# Patient Record
Sex: Female | Born: 1955 | Race: White | Hispanic: No | Marital: Married | Smoking: Never smoker
Health system: Southern US, Community
[De-identification: ages and names within clinical notes are randomized; demographics above are authoritative.]

## PROBLEM LIST (undated history)

## (undated) DIAGNOSIS — D62 Acute posthemorrhagic anemia: Secondary | ICD-10-CM

## (undated) DIAGNOSIS — C3411 Malignant neoplasm of upper lobe, right bronchus or lung: Secondary | ICD-10-CM

---

## 2002-10-11 ENCOUNTER — Ambulatory Visit (HOSPITAL_BASED_OUTPATIENT_CLINIC_OR_DEPARTMENT_OTHER): Admission: RE | Admit: 2002-10-11 | Discharge: 2002-10-11 | Payer: Self-pay | Admitting: Orthopedic Surgery

## 2002-10-11 ENCOUNTER — Encounter (INDEPENDENT_AMBULATORY_CARE_PROVIDER_SITE_OTHER): Payer: Self-pay | Admitting: *Deleted

## 2003-06-18 ENCOUNTER — Ambulatory Visit (HOSPITAL_COMMUNITY): Admission: RE | Admit: 2003-06-18 | Discharge: 2003-06-18 | Payer: Self-pay | Admitting: Family Medicine

## 2003-06-18 ENCOUNTER — Encounter: Payer: Self-pay | Admitting: Family Medicine

## 2003-07-16 ENCOUNTER — Ambulatory Visit (HOSPITAL_COMMUNITY): Admission: RE | Admit: 2003-07-16 | Discharge: 2003-07-16 | Payer: Self-pay | Admitting: Family Medicine

## 2004-01-12 ENCOUNTER — Other Ambulatory Visit: Admission: RE | Admit: 2004-01-12 | Discharge: 2004-01-12 | Payer: Self-pay | Admitting: Family Medicine

## 2005-02-10 ENCOUNTER — Other Ambulatory Visit: Admission: RE | Admit: 2005-02-10 | Discharge: 2005-02-10 | Payer: Self-pay | Admitting: Family Medicine

## 2006-06-21 ENCOUNTER — Other Ambulatory Visit: Admission: RE | Admit: 2006-06-21 | Discharge: 2006-06-21 | Payer: Self-pay | Admitting: Family Medicine

## 2007-07-25 ENCOUNTER — Other Ambulatory Visit: Admission: RE | Admit: 2007-07-25 | Discharge: 2007-07-25 | Payer: Self-pay | Admitting: Family Medicine

## 2008-07-28 ENCOUNTER — Other Ambulatory Visit: Admission: RE | Admit: 2008-07-28 | Discharge: 2008-07-28 | Payer: Self-pay | Admitting: Family Medicine

## 2009-07-21 ENCOUNTER — Encounter: Admission: RE | Admit: 2009-07-21 | Discharge: 2009-07-21 | Payer: Self-pay | Admitting: Family Medicine

## 2009-08-04 ENCOUNTER — Other Ambulatory Visit: Admission: RE | Admit: 2009-08-04 | Discharge: 2009-08-04 | Payer: Self-pay | Admitting: Family Medicine

## 2010-07-27 ENCOUNTER — Encounter: Admission: RE | Admit: 2010-07-27 | Discharge: 2010-07-27 | Payer: Self-pay | Admitting: Family Medicine

## 2010-08-09 ENCOUNTER — Ambulatory Visit (HOSPITAL_COMMUNITY): Admission: RE | Admit: 2010-08-09 | Discharge: 2010-08-09 | Payer: Self-pay | Admitting: Obstetrics and Gynecology

## 2010-09-30 ENCOUNTER — Other Ambulatory Visit
Admission: RE | Admit: 2010-09-30 | Discharge: 2010-09-30 | Payer: Self-pay | Source: Home / Self Care | Admitting: Family Medicine

## 2010-09-30 ENCOUNTER — Other Ambulatory Visit: Payer: Self-pay | Admitting: Family Medicine

## 2010-10-02 ENCOUNTER — Encounter: Payer: Self-pay | Admitting: Family Medicine

## 2010-11-23 LAB — CBC
HCT: 40.9 % (ref 36.0–46.0)
Hemoglobin: 13.8 g/dL (ref 12.0–15.0)
MCH: 31.2 pg (ref 26.0–34.0)
MCHC: 33.7 g/dL (ref 30.0–36.0)
MCV: 92.5 fL (ref 78.0–100.0)
Platelets: 240 10*3/uL (ref 150–400)
RBC: 4.42 MIL/uL (ref 3.87–5.11)
RDW: 14 % (ref 11.5–15.5)
WBC: 5.3 10*3/uL (ref 4.0–10.5)

## 2010-11-23 LAB — URINALYSIS, ROUTINE W REFLEX MICROSCOPIC
Bilirubin Urine: NEGATIVE
Glucose, UA: NEGATIVE mg/dL
Hgb urine dipstick: NEGATIVE
Ketones, ur: NEGATIVE mg/dL
Nitrite: NEGATIVE
Protein, ur: NEGATIVE mg/dL
Specific Gravity, Urine: 1.01 (ref 1.005–1.030)
Urobilinogen, UA: 0.2 mg/dL (ref 0.0–1.0)
pH: 8 (ref 5.0–8.0)

## 2010-11-23 LAB — BASIC METABOLIC PANEL
BUN: 10 mg/dL (ref 6–23)
Calcium: 9.1 mg/dL (ref 8.4–10.5)
GFR calc non Af Amer: >60 mL/min (ref >60)
Glucose, Bld: 90 mg/dL (ref 70–99)
Potassium: 3.9 mEq/L (ref 3.5–5.1)

## 2011-01-28 NOTE — Op Note (Signed)
   Rebecca Lucas, Rebecca Lucas                          ACCOUNT NO.:  [id number]   MEDICAL RECORD NO.:  [id number]                   PATIENT TYPE:  AMB   LOCATION:  DSC                                  FACILITY:  [facility]   PHYSICIAN:  Harvie Junior, M.D.                DATE OF BIRTH:  [birth year]   DATE OF PROCEDURE:  10/11/2002  DATE OF DISCHARGE:                                 OPERATIVE REPORT   PREOPERATIVE DIAGNOSIS:  Cystic mass, right index finger.   POSTOPERATIVE DIAGNOSIS:  Cystic mass, right index finger.   PROCEDURE:  Excision of  mass, right index finger.   SURGEON:  Harvie Junior, M.D.   ASSISTANT:  Marshia Ly, P.A.   ANESTHESIA:  Forearm based IV regional.   INDICATIONS:  She is a 55 year old female with a long history of having  a  cystic mass at the base of her index finger over the metacarpal. Ultimately  it was aspirated in the office and we were able to get out about 1.5 cc of  fluid. The cyst recurred. Because of recurrence of cyst and continuing  complaints of pain and aggravation, she was ultimately presented to the  operating room for excision.   DESCRIPTION OF PROCEDURE:  The patient was taken to the operating room.  After adequate anesthesia was obtained with a forearm based regional, she  was placed in the supine position on the operating table and the right arm  was prepped and draped in the usual sterile fashion.   Following this a linear incision was made over the mass and the subcutaneous  tissue to the level of the mass. The mass was clearly identified and divided  in all directions from the surrounding tissue down to the flexor tendon  sheath where it arose. It was excised en mass and sent to pathology. The  flexor tendon sheath was then identified and the weakness in the sheath was  grabbed with a rongeur and the sheath was gently opened to minimize  recurrence.   At this point the wound was copious irrigation and suctioned dry. The skin  was  closed with 4-0 nylon interrupted suture. A sterile dry compressive  dressing was applied.   The patient was then to the recovery room. She was noted to be in  satisfactory condition. Estimated blood loss for the procedure was none.                                                  Harvie Junior, M.D.   Ranae Plumber  D:  10/11/2002  T:  10/11/2002  Job:  [id number]

## 2011-07-12 ENCOUNTER — Other Ambulatory Visit: Payer: Self-pay | Admitting: Family Medicine

## 2011-07-12 DIAGNOSIS — Z1231 Encounter for screening mammogram for malignant neoplasm of breast: Secondary | ICD-10-CM

## 2011-08-19 ENCOUNTER — Ambulatory Visit
Admission: RE | Admit: 2011-08-19 | Discharge: 2011-08-19 | Disposition: A | Discharge status: Home / Self Care | Payer: Commercial Managed Care - PPO | Source: Ambulatory Visit | Attending: Family Medicine | Admitting: Family Medicine

## 2011-08-19 DIAGNOSIS — Z1231 Encounter for screening mammogram for malignant neoplasm of breast: Secondary | ICD-10-CM

## 2011-10-04 ENCOUNTER — Other Ambulatory Visit: Payer: Self-pay | Admitting: Family Medicine

## 2011-10-04 ENCOUNTER — Other Ambulatory Visit (HOSPITAL_COMMUNITY)
Admission: RE | Admit: 2011-10-04 | Discharge: 2011-10-04 | Disposition: A | Discharge status: Home / Self Care | Payer: 59 | Source: Ambulatory Visit | Attending: Family Medicine | Admitting: Family Medicine

## 2011-10-04 DIAGNOSIS — Z01419 Encounter for gynecological examination (general) (routine) without abnormal findings: Secondary | ICD-10-CM | POA: Insufficient documentation

## 2012-07-17 ENCOUNTER — Other Ambulatory Visit: Payer: Self-pay | Admitting: Family Medicine

## 2012-07-17 DIAGNOSIS — Z1231 Encounter for screening mammogram for malignant neoplasm of breast: Secondary | ICD-10-CM

## 2012-08-23 ENCOUNTER — Ambulatory Visit: Payer: Commercial Managed Care - PPO

## 2012-09-27 ENCOUNTER — Ambulatory Visit
Admission: RE | Admit: 2012-09-27 | Discharge: 2012-09-27 | Disposition: A | Discharge status: Home / Self Care | Payer: 59 | Source: Ambulatory Visit | Attending: Family Medicine | Admitting: Family Medicine

## 2012-09-27 DIAGNOSIS — Z1231 Encounter for screening mammogram for malignant neoplasm of breast: Secondary | ICD-10-CM

## 2013-10-04 ENCOUNTER — Other Ambulatory Visit: Payer: Self-pay

## 2013-10-04 DIAGNOSIS — Z1231 Encounter for screening mammogram for malignant neoplasm of breast: Secondary | ICD-10-CM

## 2013-10-17 ENCOUNTER — Ambulatory Visit
Admission: RE | Admit: 2013-10-17 | Discharge: 2013-10-17 | Disposition: A | Discharge status: Home / Self Care | Payer: 59 | Source: Ambulatory Visit

## 2013-10-17 DIAGNOSIS — Z1231 Encounter for screening mammogram for malignant neoplasm of breast: Secondary | ICD-10-CM

## 2015-05-05 ENCOUNTER — Other Ambulatory Visit (HOSPITAL_COMMUNITY)
Admission: RE | Admit: 2015-05-05 | Discharge: 2015-05-05 | Disposition: A | Discharge status: Home / Self Care | Payer: 59 | Source: Ambulatory Visit | Attending: Family Medicine | Admitting: Family Medicine

## 2015-05-05 ENCOUNTER — Other Ambulatory Visit: Payer: Self-pay | Admitting: Family Medicine

## 2015-05-05 DIAGNOSIS — Z01419 Encounter for gynecological examination (general) (routine) without abnormal findings: Secondary | ICD-10-CM | POA: Insufficient documentation

## 2015-05-05 DIAGNOSIS — Z1151 Encounter for screening for human papillomavirus (HPV): Secondary | ICD-10-CM | POA: Insufficient documentation

## 2015-05-06 LAB — CYTOLOGY - PAP

## 2015-10-14 ENCOUNTER — Other Ambulatory Visit: Payer: Self-pay | Admitting: Occupational Medicine

## 2015-10-14 ENCOUNTER — Ambulatory Visit: Payer: Self-pay

## 2015-10-14 DIAGNOSIS — M79671 Pain in right foot: Secondary | ICD-10-CM

## 2015-11-02 MED FILL — BUTALBITAL COMPOUND CAPSULE: 50-325-40 | 8 days supply | Qty: 30 | Fill #1

## 2016-05-17 DIAGNOSIS — L821 Other seborrheic keratosis: Secondary | ICD-10-CM | POA: Diagnosis not present

## 2016-05-17 DIAGNOSIS — D239 Other benign neoplasm of skin, unspecified: Secondary | ICD-10-CM | POA: Diagnosis not present

## 2016-06-10 DIAGNOSIS — Z0100 Encounter for examination of eyes and vision without abnormal findings: Secondary | ICD-10-CM | POA: Diagnosis not present

## 2016-06-20 ENCOUNTER — Other Ambulatory Visit: Payer: Self-pay | Admitting: Family Medicine

## 2016-06-20 DIAGNOSIS — Z1231 Encounter for screening mammogram for malignant neoplasm of breast: Secondary | ICD-10-CM

## 2016-06-24 ENCOUNTER — Ambulatory Visit
Admission: RE | Admit: 2016-06-24 | Discharge: 2016-06-24 | Disposition: A | Discharge status: Home / Self Care | Payer: 59 | Source: Ambulatory Visit | Attending: Family Medicine | Admitting: Family Medicine

## 2016-06-24 DIAGNOSIS — Z1231 Encounter for screening mammogram for malignant neoplasm of breast: Secondary | ICD-10-CM | POA: Diagnosis not present

## 2016-10-31 DIAGNOSIS — Z13220 Encounter for screening for lipoid disorders: Secondary | ICD-10-CM | POA: Diagnosis not present

## 2016-10-31 DIAGNOSIS — G43009 Migraine without aura, not intractable, without status migrainosus: Secondary | ICD-10-CM | POA: Diagnosis not present

## 2016-10-31 DIAGNOSIS — Z0000 Encounter for general adult medical examination without abnormal findings: Secondary | ICD-10-CM | POA: Diagnosis not present

## 2016-10-31 DIAGNOSIS — G4700 Insomnia, unspecified: Secondary | ICD-10-CM | POA: Diagnosis not present

## 2016-10-31 MED FILL — ZOLPIDEM TARTRATE 10 MG TAB: 10 | 30 days supply | Qty: 30 | Fill #0

## 2016-10-31 MED FILL — BUTALBITAL COMPOUND CAPSULE: 50-325-40 | 8 days supply | Qty: 30 | Fill #0

## 2017-07-27 DIAGNOSIS — Z0100 Encounter for examination of eyes and vision without abnormal findings: Secondary | ICD-10-CM | POA: Diagnosis not present

## 2017-09-15 ENCOUNTER — Other Ambulatory Visit: Payer: Self-pay | Admitting: Family Medicine

## 2017-09-15 DIAGNOSIS — Z1231 Encounter for screening mammogram for malignant neoplasm of breast: Secondary | ICD-10-CM

## 2017-10-17 DIAGNOSIS — D229 Melanocytic nevi, unspecified: Secondary | ICD-10-CM | POA: Diagnosis not present

## 2017-10-17 DIAGNOSIS — L814 Other melanin hyperpigmentation: Secondary | ICD-10-CM | POA: Diagnosis not present

## 2017-10-24 ENCOUNTER — Ambulatory Visit: Payer: Self-pay

## 2017-11-02 ENCOUNTER — Ambulatory Visit
Admission: RE | Admit: 2017-11-02 | Discharge: 2017-11-02 | Disposition: A | Discharge status: Home / Self Care | Payer: 59 | Source: Ambulatory Visit | Attending: Family Medicine | Admitting: Family Medicine

## 2017-11-02 DIAGNOSIS — Z1231 Encounter for screening mammogram for malignant neoplasm of breast: Secondary | ICD-10-CM | POA: Diagnosis not present

## 2017-12-05 DIAGNOSIS — Z0000 Encounter for general adult medical examination without abnormal findings: Secondary | ICD-10-CM | POA: Diagnosis not present

## 2017-12-05 DIAGNOSIS — G43009 Migraine without aura, not intractable, without status migrainosus: Secondary | ICD-10-CM | POA: Diagnosis not present

## 2017-12-05 DIAGNOSIS — Z13220 Encounter for screening for lipoid disorders: Secondary | ICD-10-CM | POA: Diagnosis not present

## 2017-12-05 DIAGNOSIS — G4700 Insomnia, unspecified: Secondary | ICD-10-CM | POA: Diagnosis not present

## 2017-12-05 MED FILL — ZOLPIDEM TARTRATE 10 MG TAB: 10 | 30 days supply | Qty: 30 | Fill #0

## 2017-12-05 MED FILL — BUTALBITAL-ASA-CAFFEINE CAP: 50-325-40 | 8 days supply | Qty: 30 | Fill #0

## 2017-12-07 ENCOUNTER — Other Ambulatory Visit: Payer: Self-pay | Admitting: Family Medicine

## 2017-12-07 DIAGNOSIS — Z780 Asymptomatic menopausal state: Secondary | ICD-10-CM

## 2018-01-11 ENCOUNTER — Ambulatory Visit
Admission: RE | Admit: 2018-01-11 | Discharge: 2018-01-11 | Disposition: A | Discharge status: Home / Self Care | Payer: 59 | Source: Ambulatory Visit | Attending: Family Medicine | Admitting: Family Medicine

## 2018-01-11 DIAGNOSIS — Z780 Asymptomatic menopausal state: Secondary | ICD-10-CM

## 2018-01-11 DIAGNOSIS — M85851 Other specified disorders of bone density and structure, right thigh: Secondary | ICD-10-CM | POA: Diagnosis not present

## 2018-01-11 DIAGNOSIS — M810 Age-related osteoporosis without current pathological fracture: Secondary | ICD-10-CM | POA: Diagnosis not present

## 2018-01-12 MED FILL — ALENDRONATE NA 70 MG TAB: 70 | 84 days supply | Qty: 12 | Fill #0

## 2018-01-31 DIAGNOSIS — S161XXA Strain of muscle, fascia and tendon at neck level, initial encounter: Secondary | ICD-10-CM | POA: Diagnosis not present

## 2018-01-31 DIAGNOSIS — L729 Follicular cyst of the skin and subcutaneous tissue, unspecified: Secondary | ICD-10-CM | POA: Diagnosis not present

## 2018-02-12 MED FILL — METAXALONE 800 MG TABLET: 800 | 10 days supply | Qty: 30 | Fill #0

## 2018-02-22 ENCOUNTER — Other Ambulatory Visit: Payer: Self-pay | Admitting: Physician Assistant

## 2018-02-22 ENCOUNTER — Ambulatory Visit
Admission: RE | Admit: 2018-02-22 | Discharge: 2018-02-22 | Disposition: A | Discharge status: Home / Self Care | Payer: 59 | Source: Ambulatory Visit | Attending: Physician Assistant | Admitting: Physician Assistant

## 2018-02-22 DIAGNOSIS — S161XXD Strain of muscle, fascia and tendon at neck level, subsequent encounter: Secondary | ICD-10-CM | POA: Diagnosis not present

## 2018-02-22 DIAGNOSIS — L729 Follicular cyst of the skin and subcutaneous tissue, unspecified: Secondary | ICD-10-CM | POA: Diagnosis not present

## 2018-02-22 DIAGNOSIS — M542 Cervicalgia: Secondary | ICD-10-CM | POA: Diagnosis not present

## 2018-02-23 ENCOUNTER — Other Ambulatory Visit: Payer: Self-pay | Admitting: Physician Assistant

## 2018-02-23 ENCOUNTER — Other Ambulatory Visit (HOSPITAL_COMMUNITY): Payer: Self-pay | Admitting: Physician Assistant

## 2018-02-23 DIAGNOSIS — R937 Abnormal findings on diagnostic imaging of other parts of musculoskeletal system: Secondary | ICD-10-CM

## 2018-02-24 ENCOUNTER — Ambulatory Visit
Admission: RE | Admit: 2018-02-24 | Discharge: 2018-02-24 | Disposition: A | Discharge status: Home / Self Care | Payer: 59 | Source: Ambulatory Visit | Attending: Physician Assistant | Admitting: Physician Assistant

## 2018-02-24 DIAGNOSIS — R937 Abnormal findings on diagnostic imaging of other parts of musculoskeletal system: Secondary | ICD-10-CM

## 2018-02-24 DIAGNOSIS — S14112A Complete lesion at C2 level of cervical spinal cord, initial encounter: Secondary | ICD-10-CM | POA: Diagnosis not present

## 2018-02-26 ENCOUNTER — Other Ambulatory Visit (HOSPITAL_COMMUNITY): Payer: Self-pay | Admitting: Physician Assistant

## 2018-02-26 DIAGNOSIS — R937 Abnormal findings on diagnostic imaging of other parts of musculoskeletal system: Secondary | ICD-10-CM

## 2018-02-26 DIAGNOSIS — M7989 Other specified soft tissue disorders: Secondary | ICD-10-CM

## 2018-02-26 DIAGNOSIS — G9589 Other specified diseases of spinal cord: Secondary | ICD-10-CM

## 2018-02-27 ENCOUNTER — Ambulatory Visit (HOSPITAL_COMMUNITY): Payer: 59

## 2018-03-05 ENCOUNTER — Ambulatory Visit (HOSPITAL_COMMUNITY)
Admission: RE | Admit: 2018-03-05 | Discharge: 2018-03-05 | Disposition: A | Discharge status: Home / Self Care | Payer: 59 | Source: Ambulatory Visit | Attending: Physician Assistant | Admitting: Physician Assistant

## 2018-03-05 DIAGNOSIS — C801 Malignant (primary) neoplasm, unspecified: Secondary | ICD-10-CM | POA: Diagnosis not present

## 2018-03-05 DIAGNOSIS — M4050 Lordosis, unspecified, site unspecified: Secondary | ICD-10-CM | POA: Insufficient documentation

## 2018-03-05 DIAGNOSIS — C7951 Secondary malignant neoplasm of bone: Secondary | ICD-10-CM | POA: Insufficient documentation

## 2018-03-05 DIAGNOSIS — R918 Other nonspecific abnormal finding of lung field: Secondary | ICD-10-CM | POA: Insufficient documentation

## 2018-03-05 DIAGNOSIS — Z7982 Long term (current) use of aspirin: Secondary | ICD-10-CM | POA: Diagnosis not present

## 2018-03-05 DIAGNOSIS — R937 Abnormal findings on diagnostic imaging of other parts of musculoskeletal system: Secondary | ICD-10-CM

## 2018-03-05 DIAGNOSIS — C7972 Secondary malignant neoplasm of left adrenal gland: Secondary | ICD-10-CM | POA: Diagnosis not present

## 2018-03-05 DIAGNOSIS — G9589 Other specified diseases of spinal cord: Secondary | ICD-10-CM

## 2018-03-05 DIAGNOSIS — C760 Malignant neoplasm of head, face and neck: Secondary | ICD-10-CM | POA: Diagnosis not present

## 2018-03-05 DIAGNOSIS — C7989 Secondary malignant neoplasm of other specified sites: Secondary | ICD-10-CM | POA: Diagnosis not present

## 2018-03-05 DIAGNOSIS — R221 Localized swelling, mass and lump, neck: Secondary | ICD-10-CM | POA: Diagnosis present

## 2018-03-05 DIAGNOSIS — C7970 Secondary malignant neoplasm of unspecified adrenal gland: Secondary | ICD-10-CM | POA: Diagnosis not present

## 2018-03-05 LAB — GLUCOSE, CAPILLARY: Glucose-Capillary: 101 mg/dL — ABNORMAL HIGH (ref 65–99)

## 2018-03-05 MED ORDER — FLUDEOXYGLUCOSE F - 18 (FDG) INJECTION
6.4000 | Freq: Once | INTRAVENOUS | Status: AC
  Administered 2018-03-05: 6.4 via INTRAVENOUS

## 2018-03-05 MED FILL — traMADol HCL 50 MG TABS: 50 | 7 days supply | Qty: 28 | Fill #0

## 2018-03-06 ENCOUNTER — Inpatient Hospital Stay: Payer: 59 | Attending: Internal Medicine | Admitting: Internal Medicine

## 2018-03-06 ENCOUNTER — Encounter: Payer: Self-pay | Admitting: Internal Medicine

## 2018-03-06 ENCOUNTER — Inpatient Hospital Stay: Payer: 59

## 2018-03-06 ENCOUNTER — Other Ambulatory Visit: Payer: Self-pay | Admitting: Internal Medicine

## 2018-03-06 ENCOUNTER — Other Ambulatory Visit: Payer: Self-pay | Admitting: Radiology

## 2018-03-06 ENCOUNTER — Telehealth: Payer: Self-pay | Admitting: Medical Oncology

## 2018-03-06 ENCOUNTER — Other Ambulatory Visit (HOSPITAL_COMMUNITY): Payer: Self-pay | Admitting: Family Medicine

## 2018-03-06 DIAGNOSIS — G9389 Other specified disorders of brain: Secondary | ICD-10-CM | POA: Diagnosis not present

## 2018-03-06 DIAGNOSIS — C7801 Secondary malignant neoplasm of right lung: Secondary | ICD-10-CM

## 2018-03-06 DIAGNOSIS — L989 Disorder of the skin and subcutaneous tissue, unspecified: Secondary | ICD-10-CM

## 2018-03-06 DIAGNOSIS — C7951 Secondary malignant neoplasm of bone: Secondary | ICD-10-CM | POA: Insufficient documentation

## 2018-03-06 DIAGNOSIS — C799 Secondary malignant neoplasm of unspecified site: Secondary | ICD-10-CM | POA: Insufficient documentation

## 2018-03-06 DIAGNOSIS — R222 Localized swelling, mass and lump, trunk: Secondary | ICD-10-CM

## 2018-03-06 DIAGNOSIS — M899 Disorder of bone, unspecified: Secondary | ICD-10-CM | POA: Diagnosis not present

## 2018-03-06 DIAGNOSIS — R918 Other nonspecific abnormal finding of lung field: Secondary | ICD-10-CM | POA: Diagnosis not present

## 2018-03-06 DIAGNOSIS — C3490 Malignant neoplasm of unspecified part of unspecified bronchus or lung: Secondary | ICD-10-CM

## 2018-03-06 DIAGNOSIS — C3411 Malignant neoplasm of upper lobe, right bronchus or lung: Secondary | ICD-10-CM | POA: Insufficient documentation

## 2018-03-06 LAB — CMP (CANCER CENTER ONLY)
ALT: 49 U/L — AB (ref 0–44)
ANION GAP: 9 (ref 5–15)
AST: 33 U/L (ref 15–41)
Albumin: 3.5 g/dL (ref 3.5–5.0)
Alkaline Phosphatase: 93 U/L (ref 38–126)
BILIRUBIN TOTAL: 0.2 mg/dL — AB (ref 0.3–1.2)
BUN: 9 mg/dL (ref 6–20)
CALCIUM: 10 mg/dL (ref 8.9–10.3)
CO2: 26 mmol/L (ref 22–32)
CREATININE: 0.75 mg/dL (ref 0.44–1.00)
Chloride: 103 mmol/L (ref 98–111)
Glucose, Bld: 80 mg/dL (ref 70–99)
Potassium: 5.1 mmol/L (ref 3.5–5.1)
Sodium: 138 mmol/L (ref 135–145)
TOTAL PROTEIN: 7.7 g/dL (ref 6.5–8.1)

## 2018-03-06 LAB — CBC WITH DIFFERENTIAL (CANCER CENTER ONLY)
BASOS ABS: 0.1 10*3/uL (ref 0.0–0.1)
BASOS PCT: 1 %
EOS ABS: 0.6 10*3/uL — AB (ref 0.0–0.5)
Eosinophils Relative: 5 %
HCT: 38.1 % (ref 34.8–46.6)
HEMOGLOBIN: 12.3 g/dL (ref 11.6–15.9)
Lymphocytes Relative: 18 %
Lymphs Abs: 2.2 10*3/uL (ref 0.9–3.3)
MCH: 28.7 pg (ref 25.1–34.0)
MCHC: 32.3 g/dL (ref 31.5–36.0)
MCV: 89 fL (ref 79.5–101.0)
Monocytes Absolute: 0.9 10*3/uL (ref 0.1–0.9)
Monocytes Relative: 7 %
NEUTROS ABS: 8.7 10*3/uL — AB (ref 1.5–6.5)
NEUTROS PCT: 69 %
PLATELETS: 419 10*3/uL — AB (ref 145–400)
RBC: 4.28 MIL/uL (ref 3.70–5.45)
RDW: 13.5 % (ref 11.2–14.5)
WBC Count: 12.5 10*3/uL — ABNORMAL HIGH (ref 3.9–10.3)

## 2018-03-06 NOTE — Progress Notes (Signed)
Histology and Location of Primary Cancer: likely metastatic neoplasm concerning for non small cell lung cancer probably adenocarcinoma but melanoma could not be excluded with the subcutaneous lesions in the scalp and muscle. Patient referred by Dr. Curt Bears. Reports neck pain began the week prior to [facility].   Sites of Visceral and Bony Metastatic Disease: C2, C3, C4, cutaneous scalp lesion, right upper lobe lung, precarinal lymph node and the right lower paratracheal lymp node, left adrenal gland, right sacrum, left iliac bone  Location(s) of Symptomatic Metastases: C2  Past/Anticipated chemotherapy by medical oncology, if any: awaiting biopsy results, referral to radiation oncology  Pain on a scale of 0-10 is: Reports pain in the neck and upper back and shoulder area managed with Tramadol and Tylenol. Reports taking Tramadol three times per day with two Tylenol then Ambien at bedtime.   If Spine Met(s), symptoms, if any, include:  Bowel/Bladder retention or incontinence (please describe): no  Numbness or weakness in extremities (please describe): no  Current Decadron regimen, if applicable: completed a prednisone taper three weeks ago when pain was thought to be related to an injured muscle. Reports applying ice to her neck and heat to her shoulders also helps.   Ambulatory status? [facility]? Wheelchair?: Ambulatory  SAFETY ISSUES:  Prior radiation? no  Pacemaker/ICD? no  Possible current pregnancy? no  Is the patient on methotrexate? no  Current Complaints / other details:  62 year old female. Married to Dr. [name] (occupational health). Has two children. Works at [employer].   Reports hot flashes. Denies SOB, cough, hemoptysis, or weight loss. CT guided biopsy of the soft tissue of neck scheduled for 03/08/2018. Dr. Alyson Ingles has placed an order for a brain MRI and it has been scheduled for Friday morning at 0700.   Reports new onset right leg spasm.  Denies numbness or tingling of lower and upper extremities. Denies visual changes. Denies tinnitus. Reports neck ROM is limited by pain. Denies bowel or bladder changes. Reports pressure on either side of her neck when she swallow but no difficulty.

## 2018-03-06 NOTE — Telephone Encounter (Signed)
Appt confirmed for today.

## 2018-03-06 NOTE — Progress Notes (Signed)
[facility] [phone]   [phone]  CONSULT NOTE  REFERRING PHYSICIAN: Dr. Maury Dus  REASON FOR CONSULTATION:  62 years old white female with suspicious metastatic neoplasm.  HPI Rebecca Lucas is a 62 y.o. female and never smoker with no significant past medical history.  The patient mentioned that she has been complaining of neck pain since Memorial Day weekend.  She was seen by her primary care physician and treated for muscle spasm with ibuprofen.  She has initial improvement in her condition but her pain started getting worse and she was seen by the nurse practitioner in the practice who order cervical spine x-ray.  This was performed on 02/22/2018 and that showed expansible ill-defined bone lesion in the posterior aspect of the C2 spinous process with bone destruction which appears also to involve C3 spinous process.  The appearance is most concerning for malignancy until proven otherwise.  MRI of the cervical spine was performed on 02/24/2018 and that showed expansible 3.6 x 5.0 x 5.8 cm soft tissue mass arising from the posterior element of C2, symmetrically invading the paraspinal soft tissue and dorsal epidural space.  There is also probable tumor within the vertebral C2.  A PET scan was performed on March 05, 2018 and that showed intensely hypermetabolic paraspinal mass posterior to the C2, C3 and C4 vertebral bodies.  Mass corresponds to mass lesion identified on comparison MRI.  There was mass extremely hypermetabolic active with SUV max of 40.  There is involvement of the spinous process at C2 and C3.  There was mild probable epidural extension of the tumor.  There was a cutaneous lesion in the scalp posterior to the right occipital bone with SUV max of 21.2.  The PET scan also showed large intensely hypermetabolic mass in the right upper lobe measuring 5.7 cm with SUV max equivalent to 28.4.  There are several adjacent satellite nodules inferior  to the mass which are also intensely hypermetabolic.  There was also hypermetabolic precarinal lymph node and the right lower paratracheal lymph node with SUV max of 7.6.  The more distant nodule in the right lower lobe just above the right hemidiaphragm is also hypermetabolic.  There was also hypermetabolic enlargement of the left adrenal gland measuring 1.8 cm with SUV max of 6.5 and hypermetabolic lesion along the posterior aspect of the right hepatic lobe within the intercostal musculature.  There was also a soft tissue mass measuring 1.7 cm with SUV max of 34.8 dorsal to the left psoas muscle at the posterior aspect of the SI joint.  The skeletal exam showed the skeletal metastasis within the right sacral ala with SUV max of 32 and additional metastatic lesions in the left gluteus muscle, posterior aspect of the left iliac bone. Dr. Alyson Ingles kindly referred the patient to me today for further evaluation and recommendation regarding her condition. When seen today the patient is feeling fine except for the pain in the neck and upper back and shoulder area.  She denied having any chest pain, shortness breath, cough or hemoptysis.  She denied having any recent weight loss or night sweats but she continues to have hot flashes.  She has no nausea, vomiting, diarrhea or constipation.  She denied having any headache or visual changes.  She had routine annual exam as well as routine annual skin exam and her last mammogram was 2 months ago. Family history significant for mother with ALS and father has neuropathy. The patient is married  and has 2 children.  She was accompanied today by her husband Dr. Carin Hock, the physician with occupational health.  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  She has no history for smoking.  She drinks alcohol occasionally and no history of drug abuse.  HPI  History reviewed. No pertinent past medical history.  History  reviewed. No pertinent surgical history.  Family History  Problem Relation Age of Onset  . Neuropathy Mother   . ALS Father     Social History Social History   Tobacco Use  . Smoking status: Not on file  Substance Use Topics  . Alcohol use: Not on file  . Drug use: Not on file    No Known Allergies  Current Outpatient Medications  Medication Sig Dispense Refill  . acetaminophen (TYLENOL) 500 MG tablet Take 1,000 mg by mouth every 6 (six) hours as needed.    Marland Kitchen alendronate (FOSAMAX) 70 MG tablet Take 70 mg by mouth once a week. Wednesday  3  . butalbital-aspirin-caffeine (FIORINAL) 50-325-40 MG capsule Take 1 capsule by mouth every 6 (six) hours as needed.  1  . Calcium Carbonate-Vitamin D (CALCIUM-D PO) Take 1 tablet by mouth 2 (two) times daily.    Marland Kitchen ibuprofen (ADVIL,MOTRIN) 200 MG tablet Take 600 mg by mouth every 6 (six) hours as needed.    . zolpidem (AMBIEN) 10 MG tablet Take 10 mg by mouth at bedtime as needed.  0   No current facility-administered medications for this visit.     Review of Systems  Constitutional: positive for fatigue Eyes: negative Ears, nose, mouth, throat, and face: negative Respiratory: negative Cardiovascular: negative Gastrointestinal: negative Genitourinary:negative Integument/breast: negative Hematologic/lymphatic: negative Musculoskeletal:positive for neck pain Neurological: negative Behavioral/Psych: negative Endocrine: negative Allergic/Immunologic: negative  Physical Exam  ACZ:YSAYT, healthy, no distress, well nourished, well developed and anxious SKIN: skin color, texture, turgor are normal, no rashes or significant lesions HEAD: Palpable lesion in the right posterior occipital area EYES: normal, PERRLA EARS: External ears normal, Canals clear OROPHARYNX:no exudate, no erythema and lips, buccal mucosa, and tongue normal  NECK: supple, no adenopathy, no JVD LYMPH:  no palpable lymphadenopathy, no  hepatosplenomegaly BREAST:not examined LUNGS: clear to auscultation , and palpation HEART: regular rate & rhythm, no murmurs and no gallops ABDOMEN:abdomen soft, non-tender, normal bowel sounds and no masses or organomegaly BACK: Back symmetric, no curvature., No CVA tenderness EXTREMITIES:no joint deformities, effusion, or inflammation, no edema  NEURO: alert & oriented x 3 with fluent speech, no focal motor/sensory deficits  PERFORMANCE STATUS: ECOG 1  LABORATORY DATA: Lab Results  Component Value Date   WBC 5.3 08/09/2010   HGB 13.8 08/09/2010   HCT 40.9 08/09/2010   MCV 92.5 08/09/2010   PLT 240 08/09/2010      Chemistry      Component Value Date/Time   NA 138 08/09/2010 1030   K 3.9 08/09/2010 1030   CL 103 08/09/2010 1030   CO2 28 08/09/2010 1030   BUN 10 08/09/2010 1030   CREATININE 0.76 08/09/2010 1030      Component Value Date/Time   CALCIUM 9.1 08/09/2010 1030       RADIOGRAPHIC STUDIES: Dg Cervical Spine Complete  Result Date: 02/22/2018 CLINICAL DATA:  New onset neck pain for 5 weeks. EXAM: CERVICAL SPINE - COMPLETE 4+ VIEW COMPARISON:  None. FINDINGS: There is no evidence of cervical spine fracture or prevertebral soft tissue swelling. Alignment is normal. Degenerative disc disease with disc height  loss at C5-6. Expansile ill-defined bone lesion in the posterior aspect of the C2 spinous process with bone destruction which appears also involve C3 spinous process. IMPRESSION: Expansile ill-defined bone lesion in the posterior aspect of the C2 spinous process with bone destruction which appears also involve C3 spinous process. The appearance is most concerning for malignancy until proven otherwise. Recommend further evaluation with a CT or MRI of the cervical spine. No acute osseous injury of the cervical spine. These results will be called to the ordering clinician or representative by the Radiologist Assistant, and communication documented in the PACS or zVision  Dashboard. Electronically Signed   By: Kathreen Devoid   On: 02/22/2018 14:18   Mr Cervical Spine Wo Contrast  Result Date: 02/25/2018 CLINICAL DATA:  Neck pain for 6 weeks.  Follow-up C2 lesion. EXAM: MRI CERVICAL SPINE WITHOUT CONTRAST TECHNIQUE: Multiplanar, multisequence MR imaging of the cervical spine was performed. No intravenous contrast was administered. COMPARISON:  Cervical spine radiographs February 22, 2018 FINDINGS: ALIGNMENT: Maintained cervical lordosis.  No malalignment. VERTEBRAE/DISCS: Expansile 3.6 x 5 x 5.8 cm (AP by transverse by cc) soft tissue mass arising from the posterior elements of C2, symmetrically invading the paraspinal soft tissues and dorsal epidural space. Low T1 and bright STIR signal ventral C2. Vertebral bodies otherwise intact. Moderate to severe C4-5 and C5 disc height loss with mild chronic discogenic endplate changes. CORD:Cervical spinal cord is normal morphology and signal characteristics from the cervicomedullary junction to level of T2-3, the most caudal well visualized level. POSTERIOR FOSSA, VERTEBRAL ARTERIES, PARASPINAL TISSUES: No MR findings of ligamentous injury. Vertebral artery flow voids present. Included posterior fossa and paraspinal soft tissues are normal. DISC LEVELS: C2-3: Tumor within the dorsal epidural space effaces the posterior thecal sac, AP dimension of the canal is 10 mm. No neural foraminal narrowing. Mild facet arthropathy. C3-4: Small LEFT central disc protrusion, uncovertebral hypertrophy and mild facet arthropathy. No canal stenosis or neural foraminal narrowing. C4-5: Small broad-based disc bulge, uncovertebral hypertrophy and mild facet arthropathy. No canal stenosis. Minimal RIGHT neural foraminal narrowing. C5-6: Small broad-based disc bulge, uncovertebral hypertrophy and mild-to-moderate facet arthropathy. No canal stenosis. Mild bilateral neural foraminal narrowing. C6-7: Small broad-based disc bulge and central disc protrusion without  canal stenosis or neural foraminal narrowing. C7-T1: No disc bulge, canal stenosis nor neural foraminal narrowing. IMPRESSION: 1. 3.6 x 5 x 5.8 cm soft tissue mass arising from posterior elements of C2 with paraspinal muscle and dorsal epidural invasion. Probable tumor within ventral C2. Differential diagnosis includes plasmacytoma and metastatic disease. Recommend PET-CT and/or sampling. 2. Degenerative cervical spine. No canal stenosis. Minimal to mild C4-5 and C5-6 neural foraminal narrowing. 3. These results will be called to the ordering clinician or representative by the Radiologist Assistant, and communication documented in the PACS or zVision Dashboard. Electronically Signed   By: Elon Alas M.D.   On: 02/25/2018 02:47   Nm Pet Image Initial (pi) Skull Base To Thigh  Result Date: 03/05/2018 CLINICAL DATA:  Initial treatment strategy for paraspinal mass along the upper cervical spine EXAM: NUCLEAR MEDICINE PET SKULL BASE TO THIGH TECHNIQUE: 6.4 mCi F-18 FDG was injected intravenously. Full-ring PET imaging was performed from the skull base to thigh after the radiotracer. CT data was obtained and used for attenuation correction and anatomic localization. Fasting blood glucose: 101 mg/dl COMPARISON:  MRI 02/24/2018 FINDINGS: Mediastinal blood pool activity: SUV max 2.0 NECK: Intensely hypermetabolic paraspinal mass posterior to the C2, C3, and C4 vertebral bodies. Mass corresponds  to mass lesion identified on comparison MRI. Mass extremely hypermetabolic active with SUV max equal 40. There is involvement of the spinous process at C2 and C3. Mild probable epidural extension of the tumor. There is a cutaneous lesion in the scalp posterior to the RIGHT occipital bone with SUV max equal 21.2 (image 10 of the fused data set. Incidental CT findings: none CHEST: Large intensely hypermetabolic mass in the RIGHT upper lobe measures 5.7 cm. (SUV max equal 28.4). There several adjacent satellite nodules upra  inferior to the mass which are also intensely hypermetabolic. Hypermetabolic precarinal lymph nodes and RIGHT para lower tracheal lymph nodes with SUV max equal 7.6. The more distant nodule in the RIGHT lower lobe just above the RIGHT hemidiaphragm is also hypermetabolic Incidental CT findings: none ABDOMEN/PELVIS: Hypermetabolic enlargement of the LEFT adrenal gland to 1.8 cm with SUV max equal 6.5. Hypermetabolic lesion along the posterior aspect the RIGHT hepatic lobe is within the intercostal musculature (SUV max equal 19.1, image 111) Dorsal to the LEFT psoas muscle, at the superior aspect of the SI joint, is a soft tissue mass measuring 1.7 cm with SUV max equal 34.8. Incidental CT findings: Uterus and ovaries are normal. Kidneys normal. Pancreas normal. SKELETON: Skeletal metastasis within the RIGHT sacral ala with SUV max equal 32. subtle ground-glass lesion measuring 20 mm is present on image 151/4. Additional metastatic lesion in the posterior aspect of the LEFT iliac bone with SUV max equal 26. Additional metastatic lesion in the LEFT gluteus muscle which difficult to see on the CT portion exam present tense with SUV max equal 24. Incidental CT findings: none IMPRESSION: 1. Multiorgan intensely hypermetabolic metastatic disease. Favor primary small cell lung cancer versus less favored melanoma. 2. Large RIGHT upper lobe mass measures 5.7 cm and is favored primary lesion. 3. Intensely hypermetabolic posterior paraspinal mass involving the musculature and spinous processes of the upper cervical spine. 4. Small cutaneous lesion posterior to the RIGHT occipital bone is favor metastatic deposit. 5. Additional metastatic lesions in the LEFT adrenal gland and intracostal muscles of the posterior RIGHT lower ribs. Soft tissue metastasis dorsal to the LEFT psoas muscle. 6. Skeletal metastasis evident within the RIGHT sacral ala and LEFT ischium. If multidisciplinary follow up management is desired, this is  available in the [place] through the [facility] [phone]. Electronically Signed   By: Suzy Bouchard M.D.   On: 03/05/2018 14:37    ASSESSMENT: This is a very pleasant 62 years old never smoker white female with likely metastatic neoplasm concerning for non-small cell lung cancer probably adenocarcinoma but melanoma could not be excluded in this patient especially with the subcutaneous lesions in the scalp and muscles.   PLAN: I had a lengthy discussion with the patient and her husband about her current condition and further investigation to confirm a diagnosis as well as possible treatment options. I personally and independently reviewed the PET scan images and discussed the result and showed the images to the patient and her husband. I recommended for the patient to have MRI of the brain to complete the staging work-up and to rule out any metastatic disease to the brain.  We will try to speed this process to get the MRI done within the next few days. The patient is a scheduled to have CT-guided biopsy of the soft tissue mass in the neck area on 03/08/2018.  If the final pathology is consistent with adenocarcinoma of the lung, we will send the tissue block to  [facility] for molecular studies and PDL 1 expression.  Other neoplasm cannot be excluded at this point. I also discussed her case with Dr. Tammi Klippel from radiation oncology for consideration of palliative radiotherapy to the large C2 neck mass.  He will arrange for her a visit in the next few days. I will arrange for the patient to come back for follow-up visit few days after her biopsy results becomes available to discuss her treatment options and more details. For pain management, she will continue on tramadol for now. The patient was advised to call immediately if she has any concerning symptoms in the interval. The patient voices understanding of current disease status and treatment options and is in  agreement with the current care plan.  All questions were answered. The patient knows to call the clinic with any problems, questions or concerns. We can certainly see the patient much sooner if necessary.  Thank you so much for allowing me to participate in the care of Rebecca Lucas. I will continue to follow up the patient with you and assist in her care.  I spent 40 minutes counseling the patient face to face. The total time spent in the appointment was 60 minutes.  Disclaimer: This note was dictated with voice recognition software. Similar sounding words can inadvertently be transcribed and may not be corrected upon review.   Eilleen Kempf March 06, 2018, 11:43 AM

## 2018-03-07 ENCOUNTER — Ambulatory Visit
Admission: RE | Admit: 2018-03-07 | Discharge: 2018-03-07 | Disposition: A | Discharge status: Home / Self Care | Payer: 59 | Source: Ambulatory Visit | Attending: Radiation Oncology | Admitting: Radiation Oncology

## 2018-03-07 ENCOUNTER — Other Ambulatory Visit: Payer: Self-pay

## 2018-03-07 ENCOUNTER — Encounter: Payer: Self-pay | Admitting: Radiation Oncology

## 2018-03-07 ENCOUNTER — Other Ambulatory Visit: Payer: Self-pay | Admitting: Student

## 2018-03-07 VITALS — BP 120/53 | HR 88 | Temp 97.8°F | Resp 18 | Wt 132.0 lb

## 2018-03-07 DIAGNOSIS — C7981 Secondary malignant neoplasm of breast: Secondary | ICD-10-CM | POA: Diagnosis present

## 2018-03-07 DIAGNOSIS — R221 Localized swelling, mass and lump, neck: Secondary | ICD-10-CM | POA: Diagnosis not present

## 2018-03-07 DIAGNOSIS — C7952 Secondary malignant neoplasm of bone marrow: Principal | ICD-10-CM

## 2018-03-07 DIAGNOSIS — C799 Secondary malignant neoplasm of unspecified site: Secondary | ICD-10-CM | POA: Insufficient documentation

## 2018-03-07 DIAGNOSIS — C7951 Secondary malignant neoplasm of bone: Secondary | ICD-10-CM | POA: Insufficient documentation

## 2018-03-07 DIAGNOSIS — D434 Neoplasm of uncertain behavior of spinal cord: Secondary | ICD-10-CM | POA: Diagnosis not present

## 2018-03-07 DIAGNOSIS — C801 Malignant (primary) neoplasm, unspecified: Secondary | ICD-10-CM | POA: Diagnosis not present

## 2018-03-07 NOTE — Progress Notes (Signed)
Radiation Oncology         [phone] ________________________________  Initial Outpatient Consultation  Name: Rebecca Lucas MRN: [id number]  Date of Service: 03/07/2018 DOB: 13-Nov-1955  CC:Sun, Gari Crown, MD  Curt Bears, MD   REFERRING PHYSICIAN: Curt Bears, MD  DIAGNOSIS: 62 y.o. woman with diffuse metastatic disease of unknown primary with a 5.8 cm painful posterior cervical spinal mass    ICD-10-CM   1. 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 Metastatic malignant neoplasm, unspecified site [facility]) C79.9     HISTORY OF PRESENT ILLNESS: Rebecca Lucas is a 62 y.o. female seen at the request of Dr. Julien Nordmann for suspicious metastatic neoplasm. The patient reports increasing neck pain since Memorial Day weekend. She was evaluated by her PCP and treated for muscle spasm with Ibuprofen. The patient reported this pain initially improved but then returned. She underwent x-ray of the cervical spine on 02/22/18 which showed an expansile ill-defined bone lesion in the posterior aspect of the C2 spinous process with bone destruction and appeared to involve the C3 spinous process- appearance most concerning for malignancy.   She underwent further evaluation with MRI of the cervical spine on 02/25/18 which showed an expansile 3.6 x 5 x 5.8 cm soft tissue mass arising from posterior elements of C2 with paraspinal muscle and dorsal epidural invasion as well as probable tumor within the ventral portion of the C2 vertebral body.  A PET scan was performed on 03/05/18 demonstrating diffuse intensely hypermetabolic metastatic disease felt to favor primary small cell lung cancer versus, less favored, melanoma. T here was an intensely hypermetabolic posterior paraspinal mass involving the musculature and spinous processes of the upper cervical spine, corresponding to the mass lesion seen on cervical MRI.  There is evidence of epidural extension of the tumor  but no evidence of cord compression or bony instability.   A large right upper lobe mass measures 5.7 cm and is favored primary lesion. There was a small cutaneous lesion posterior to the right occipital bone favoring a metastatic deposit. Additional metastatic lesions in the left adrenal gland and intracostal muscles of the posterior right lower ribs. Soft tissue metastasis dorsal to the left psoas muscle. Skeletal metastasis evident within the right sacral ala and left ischium.   The patient presented to Dr. Julien Nordmann on 03/06/18. A CT-guided biopsy is scheduled for 03/08/18 at 9am for tissue confirmation.  She will have an MRI of the brain on Friday 03/09/18 to complete her disease staging.  The patient's case was discussed at the multidisciplinary conference this morning. The patient's treatment plan will be determined after receiving pathology results.  PREVIOUS RADIATION THERAPY: No  PAST MEDICAL HISTORY: History reviewed. No pertinent past medical history.    PAST SURGICAL HISTORY:History reviewed. No pertinent surgical history.  FAMILY HISTORY:  Family History  Problem Relation Age of Onset  . ALS Mother 40       non genetic  . Cancer Neg Hx     SOCIAL HISTORY:  Social History   Socioeconomic History  . Marital status: Married    Spouse name: Not on file  . Number of children: Not on file  . Years of education: Not on file  . Highest education level: Not on file  Occupational History  . Not on file  Social Needs  . Financial resource strain: Not on file  . Food insecurity:    Worry: Not on file    Inability:  Not on file  . Transportation needs:    Medical: Not on file    Non-medical: Not on file  Tobacco Use  . Smoking status: Never Smoker  . Smokeless tobacco: Never Used  Substance and Sexual Activity  . Alcohol use: Yes    Comment: occasionally  . Drug use: Never  . Sexual activity: Yes  Lifestyle  . Physical activity:    Days per week: Not on file    Minutes  per session: Not on file  . Stress: Not on file  Relationships  . Social connections:    Talks on phone: Not on file    Gets together: Not on file    Attends religious service: Not on file    Active member of club or organization: Not on file    Attends meetings of clubs or organizations: Not on file    Relationship status: Not on file  . Intimate partner violence:    Fear of current or ex partner: Not on file    Emotionally abused: Not on file    Physically abused: Not on file    Forced sexual activity: Not on file  Other Topics Concern  . Not on file  Social History Narrative   Married with two children. Her husband is Dr. [name], a physician with occupational health. She has worked several jobs Energy manager, Glass blower/designer, and now the [employer].     ALLERGIES: Patient has no known allergies.  MEDICATIONS:  Current Outpatient Medications  Medication Sig Dispense Refill  . acetaminophen (TYLENOL) 500 MG tablet Take 1,000 mg by mouth every 6 (six) hours as needed.    Marland Kitchen alendronate (FOSAMAX) 70 MG tablet Take 70 mg by mouth once a week. Wednesday  3  . Calcium Carbonate-Vitamin D (CALCIUM-D PO) Take 1 tablet by mouth 2 (two) times daily.    . traMADol (ULTRAM) 50 MG tablet Take 50 mg by mouth 4 (four) times daily as needed.  0  . zolpidem (AMBIEN) 10 MG tablet Take 10 mg by mouth at bedtime as needed.  0  . butalbital-aspirin-caffeine (FIORINAL) 50-325-40 MG capsule Take 1 capsule by mouth every 6 (six) hours as needed.  1  . ibuprofen (ADVIL,MOTRIN) 200 MG tablet Take 600 mg by mouth every 6 (six) hours as needed.     No current facility-administered medications for this encounter.     REVIEW OF SYSTEMS:  On review of systems, the patient reports that she is doing well overall. She continues with pain to the neck, upper back, and shoulder area, managed with Tramadol and Tylenol. She reports taking Tramadol three times per day with two Tylenol and  Ambien at bedtime to help her rest. The pain is also improved with ice to the neck and heat to the shoulder. She completed a prednisone taper three weeks ago when the pain was thought to be related to an injured muscle-providing only temporary relief . She also reports a new onset of intermittent right leg spasm/cramp but denies numbness/tingling or focal weakness in the upper or lower extremities. She notes her neck range of motion is limited by pain. She reports pressure to either side of the neck when she swallows but denies difficulty swallowing. She denies any chest pain, shortness of breath, cough, fevers, chills, night sweats, or unintended weight changes. She denies any bowel or bladder disturbances, and denies abdominal pain, nausea or vomiting. A complete review of systems is obtained and is otherwise negative.  PHYSICAL EXAM:  Wt Readings from Last 3 Encounters:  03/07/18 132 lb (59.9 kg)  03/07/18 132 lb (59.9 kg)  03/06/18 131 lb 1.6 oz (59.5 kg)   Temp Readings from Last 3 Encounters:  03/07/18 97.8 F (36.6 C) (Oral)  03/07/18 97.8 F (36.6 C)  03/06/18 98.1 F (36.7 C) (Oral)   BP Readings from Last 3 Encounters:  03/07/18 (!) 120/53  03/07/18 (!) 120/53  03/06/18 108/62   Pulse Readings from Last 3 Encounters:  03/07/18 88  03/07/18 88  03/06/18 72   Pain Assessment Pain Score: 5 (see progress note)/10  In general this is a well appearing Caucasian female in no acute distress. She is alert and oriented x4 and appropriate throughout the examination. HEENT reveals that the patient is normocephalic, atraumatic. EOMs are intact. PERRLA. Skin is intact without any evidence of gross lesions. Cardiovascular exam reveals a regular rate and rhythm, no clicks rubs or murmurs are auscultated. Chest is clear to auscultation bilaterally. Lymphatic assessment is performed and does not reveal any adenopathy in the cervical, supraclavicular, or axillary chains. Abdomen has active bowel  sounds in all quadrants and is intact. The abdomen is soft, non tender, non distended. Lower extremities are negative for pretibial pitting edema, deep calf tenderness, cyanosis or clubbing.  No focal neurologic deficits noted on exam.   KPS = 100  100 - Normal; no complaints; no evidence of disease. 90   - Able to carry on normal activity; minor signs or symptoms of disease. 80   - Normal activity with effort; some signs or symptoms of disease. 31   - Cares for self; unable to carry on normal activity or to do active work. 60   - Requires occasional assistance, but is able to care for most of his personal needs. 50   - Requires considerable assistance and frequent medical care. 23   - Disabled; requires special care and assistance. 13   - Severely disabled; hospital admission is indicated although death not imminent. 65   - Very sick; hospital admission necessary; active supportive treatment necessary. 10   - Moribund; fatal processes progressing rapidly. 0     - Dead  Karnofsky DA, Abelmann Leona, Craver LS and Burchenal [facility] [phone]) The use of the nitrogen mustards in the palliative treatment of carcinoma: with particular reference to bronchogenic carcinoma Cancer 1 [id number]  LABORATORY DATA:  Lab Results  Component Value Date   WBC 12.5 (H) 03/06/2018   HGB 12.3 03/06/2018   HCT 38.1 03/06/2018   MCV 89.0 03/06/2018   PLT 419 (H) 03/06/2018   Lab Results  Component Value Date   NA 138 03/06/2018   K 5.1 03/06/2018   CL 103 03/06/2018   CO2 26 03/06/2018   Lab Results  Component Value Date   ALT 49 (H) 03/06/2018   AST 33 03/06/2018   ALKPHOS 93 03/06/2018   BILITOT 0.2 (L) 03/06/2018     RADIOGRAPHY: Dg Cervical Spine Complete  Result Date: 02/22/2018 CLINICAL DATA:  New onset neck pain for 5 weeks. EXAM: CERVICAL SPINE - COMPLETE 4+ VIEW COMPARISON:  None. FINDINGS: There is no evidence of cervical spine fracture or prevertebral soft tissue swelling. Alignment is normal.  Degenerative disc disease with disc height loss at C5-6. Expansile ill-defined bone lesion in the posterior aspect of the C2 spinous process with bone destruction which appears also involve C3 spinous process. IMPRESSION: Expansile ill-defined bone lesion in the posterior aspect of the C2 spinous process with bone destruction which appears  also involve C3 spinous process. The appearance is most concerning for malignancy until proven otherwise. Recommend further evaluation with a CT or MRI of the cervical spine. No acute osseous injury of the cervical spine. These results will be called to the ordering clinician or representative by the Radiologist Assistant, and communication documented in the PACS or zVision Dashboard. Electronically Signed   By: Kathreen Devoid   On: 02/22/2018 14:18   Mr Cervical Spine Wo Contrast  Result Date: 02/25/2018 CLINICAL DATA:  Neck pain for 6 weeks.  Follow-up C2 lesion. EXAM: MRI CERVICAL SPINE WITHOUT CONTRAST TECHNIQUE: Multiplanar, multisequence MR imaging of the cervical spine was performed. No intravenous contrast was administered. COMPARISON:  Cervical spine radiographs February 22, 2018 FINDINGS: ALIGNMENT: Maintained cervical lordosis.  No malalignment. VERTEBRAE/DISCS: Expansile 3.6 x 5 x 5.8 cm (AP by transverse by cc) soft tissue mass arising from the posterior elements of C2, symmetrically invading the paraspinal soft tissues and dorsal epidural space. Low T1 and bright STIR signal ventral C2. Vertebral bodies otherwise intact. Moderate to severe C4-5 and C5 disc height loss with mild chronic discogenic endplate changes. CORD:Cervical spinal cord is normal morphology and signal characteristics from the cervicomedullary junction to level of T2-3, the most caudal well visualized level. POSTERIOR FOSSA, VERTEBRAL ARTERIES, PARASPINAL TISSUES: No MR findings of ligamentous injury. Vertebral artery flow voids present. Included posterior fossa and paraspinal soft tissues are  normal. DISC LEVELS: C2-3: Tumor within the dorsal epidural space effaces the posterior thecal sac, AP dimension of the canal is 10 mm. No neural foraminal narrowing. Mild facet arthropathy. C3-4: Small LEFT central disc protrusion, uncovertebral hypertrophy and mild facet arthropathy. No canal stenosis or neural foraminal narrowing. C4-5: Small broad-based disc bulge, uncovertebral hypertrophy and mild facet arthropathy. No canal stenosis. Minimal RIGHT neural foraminal narrowing. C5-6: Small broad-based disc bulge, uncovertebral hypertrophy and mild-to-moderate facet arthropathy. No canal stenosis. Mild bilateral neural foraminal narrowing. C6-7: Small broad-based disc bulge and central disc protrusion without canal stenosis or neural foraminal narrowing. C7-T1: No disc bulge, canal stenosis nor neural foraminal narrowing. IMPRESSION: 1. 3.6 x 5 x 5.8 cm soft tissue mass arising from posterior elements of C2 with paraspinal muscle and dorsal epidural invasion. Probable tumor within ventral C2. Differential diagnosis includes plasmacytoma and metastatic disease. Recommend PET-CT and/or sampling. 2. Degenerative cervical spine. No canal stenosis. Minimal to mild C4-5 and C5-6 neural foraminal narrowing. 3. These results will be called to the ordering clinician or representative by the Radiologist Assistant, and communication documented in the PACS or zVision Dashboard. Electronically Signed   By: Elon Alas M.D.   On: 02/25/2018 02:47   Nm Pet Image Initial (pi) Skull Base To Thigh  Result Date: 03/05/2018 CLINICAL DATA:  Initial treatment strategy for paraspinal mass along the upper cervical spine EXAM: NUCLEAR MEDICINE PET SKULL BASE TO THIGH TECHNIQUE: 6.4 mCi F-18 FDG was injected intravenously. Full-ring PET imaging was performed from the skull base to thigh after the radiotracer. CT data was obtained and used for attenuation correction and anatomic localization. Fasting blood glucose: 101 mg/dl  COMPARISON:  MRI 02/24/2018 FINDINGS: Mediastinal blood pool activity: SUV max 2.0 NECK: Intensely hypermetabolic paraspinal mass posterior to the C2, C3, and C4 vertebral bodies. Mass corresponds to mass lesion identified on comparison MRI. Mass extremely hypermetabolic active with SUV max equal 40. There is involvement of the spinous process at C2 and C3. Mild probable epidural extension of the tumor. There is a cutaneous lesion in the scalp posterior to the  RIGHT occipital bone with SUV max equal 21.2 (image 10 of the fused data set. Incidental CT findings: none CHEST: Large intensely hypermetabolic mass in the RIGHT upper lobe measures 5.7 cm. (SUV max equal 28.4). There several adjacent satellite nodules upra inferior to the mass which are also intensely hypermetabolic. Hypermetabolic precarinal lymph nodes and RIGHT para lower tracheal lymph nodes with SUV max equal 7.6. The more distant nodule in the RIGHT lower lobe just above the RIGHT hemidiaphragm is also hypermetabolic Incidental CT findings: none ABDOMEN/PELVIS: Hypermetabolic enlargement of the LEFT adrenal gland to 1.8 cm with SUV max equal 6.5. Hypermetabolic lesion along the posterior aspect the RIGHT hepatic lobe is within the intercostal musculature (SUV max equal 19.1, image 111) Dorsal to the LEFT psoas muscle, at the superior aspect of the SI joint, is a soft tissue mass measuring 1.7 cm with SUV max equal 34.8. Incidental CT findings: Uterus and ovaries are normal. Kidneys normal. Pancreas normal. SKELETON: Skeletal metastasis within the RIGHT sacral ala with SUV max equal 32. subtle ground-glass lesion measuring 20 mm is present on image 151/4. Additional metastatic lesion in the posterior aspect of the LEFT iliac bone with SUV max equal 26. Additional metastatic lesion in the LEFT gluteus muscle which difficult to see on the CT portion exam present tense with SUV max equal 24. Incidental CT findings: none IMPRESSION: 1. Multiorgan intensely  hypermetabolic metastatic disease. Favor primary small cell lung cancer versus less favored melanoma. 2. Large RIGHT upper lobe mass measures 5.7 cm and is favored primary lesion. 3. Intensely hypermetabolic posterior paraspinal mass involving the musculature and spinous processes of the upper cervical spine. 4. Small cutaneous lesion posterior to the RIGHT occipital bone is favor metastatic deposit. 5. Additional metastatic lesions in the LEFT adrenal gland and intracostal muscles of the posterior RIGHT lower ribs. Soft tissue metastasis dorsal to the LEFT psoas muscle. 6. Skeletal metastasis evident within the RIGHT sacral ala and LEFT ischium. If multidisciplinary follow up management is desired, this is available in the [place] through the [facility] [phone]. Electronically Signed   By: Suzy Bouchard M.D.   On: 03/05/2018 14:37      IMPRESSION/PLAN: 1. 62 y.o. woman with diffuse metastatic disease of unknown primary.  Today, we talked to the patient and her husband about the findings and workup thus far. We discussed the natural history of likely metastatic carcinoma and general treatment, highlighting the role of palliative radiotherapy in the management.  She is scheduled for CT-guided core needle biopsy of the neck lesion on 03/08/2018 for tissue confirmation which will further guide our treatment recommendations.  We discussed the available radiation techniques with SRS and conventional radiotherapy, reviewing the role of each and focusing on the details of logistics and delivery of each. We reviewed the anticipated acute and late sequelae associated with radiation in this setting. The patient was encouraged to ask questions that were answered to her satisfaction.  At the conclusion of our conversation, the patient elects to proceed with palliative radiotherapy to the large paravertebral mass in the cervical region following the completion of her disease  staging and work-up.  She will proceed with CT-guided core needle biopsy on 03/08/2018 as well as MRI of the brain on 03/09/2018.  She understands that the final pathology will help direct our final treatment recommendations regarding stereotactic radiotherapy versus conventional, fractionated radiotherapy.  She is tentatively scheduled for CT simulation/treatment planning on Tuesday, 03/13/2018 at 8 AM but understands that this may  change based on final pathology results.  She appears to have a good understanding of her disease and our recommendations and is in agreement with the stated plan.     Nicholos Johns, PA-C    Tyler Pita, MD  [facility] Direct Dial: [phone]  Fax: [phone] [email or web address]  Skype  [email or web address]  This document serves as a record of services personally performed by Tyler Pita, MD and Freeman Caldron, PA-C. It was created on their behalf by Bethann Humble, a trained medical scribe. The creation of this record is based on the scribe's personal observations and the provider's statements to them. This document has been checked and approved by the attending provider.

## 2018-03-07 NOTE — Progress Notes (Signed)
See progress note under physician encounter. 

## 2018-03-08 ENCOUNTER — Ambulatory Visit (HOSPITAL_COMMUNITY)
Admission: RE | Admit: 2018-03-08 | Discharge: 2018-03-08 | Disposition: A | Discharge status: Home / Self Care | Payer: 59 | Source: Ambulatory Visit | Attending: Physician Assistant | Admitting: Physician Assistant

## 2018-03-08 ENCOUNTER — Encounter (HOSPITAL_COMMUNITY): Payer: Self-pay

## 2018-03-08 DIAGNOSIS — R222 Localized swelling, mass and lump, trunk: Secondary | ICD-10-CM | POA: Diagnosis not present

## 2018-03-08 DIAGNOSIS — C490 Malignant neoplasm of connective and soft tissue of head, face and neck: Secondary | ICD-10-CM | POA: Diagnosis not present

## 2018-03-08 DIAGNOSIS — M4050 Lordosis, unspecified, site unspecified: Secondary | ICD-10-CM | POA: Diagnosis not present

## 2018-03-08 DIAGNOSIS — Z7982 Long term (current) use of aspirin: Secondary | ICD-10-CM | POA: Diagnosis not present

## 2018-03-08 DIAGNOSIS — C760 Malignant neoplasm of head, face and neck: Secondary | ICD-10-CM | POA: Diagnosis not present

## 2018-03-08 DIAGNOSIS — C7972 Secondary malignant neoplasm of left adrenal gland: Secondary | ICD-10-CM | POA: Diagnosis not present

## 2018-03-08 DIAGNOSIS — R937 Abnormal findings on diagnostic imaging of other parts of musculoskeletal system: Secondary | ICD-10-CM

## 2018-03-08 DIAGNOSIS — C801 Malignant (primary) neoplasm, unspecified: Secondary | ICD-10-CM | POA: Diagnosis not present

## 2018-03-08 DIAGNOSIS — G9589 Other specified diseases of spinal cord: Secondary | ICD-10-CM

## 2018-03-08 DIAGNOSIS — M7989 Other specified soft tissue disorders: Secondary | ICD-10-CM

## 2018-03-08 DIAGNOSIS — R918 Other nonspecific abnormal finding of lung field: Secondary | ICD-10-CM | POA: Diagnosis not present

## 2018-03-08 DIAGNOSIS — C7951 Secondary malignant neoplasm of bone: Secondary | ICD-10-CM | POA: Diagnosis not present

## 2018-03-08 LAB — CBC
HCT: 36.2 % (ref 36.0–46.0)
Hemoglobin: 11.5 g/dL — ABNORMAL LOW (ref 12.0–15.0)
MCH: 28 pg (ref 26.0–34.0)
MCHC: 31.8 g/dL (ref 30.0–36.0)
MCV: 88.1 fL (ref 78.0–100.0)
PLATELETS: 369 10*3/uL (ref 150–400)
RBC: 4.11 MIL/uL (ref 3.87–5.11)
RDW: 13.2 % (ref 11.5–15.5)
WBC: 11.9 10*3/uL — ABNORMAL HIGH (ref 4.0–10.5)

## 2018-03-08 LAB — PROTIME-INR
INR: 1
Prothrombin Time: 13.1 seconds (ref 11.4–15.2)

## 2018-03-08 MED ORDER — FENTANYL CITRATE (PF) 100 MCG/2ML IJ SOLN
INTRAMUSCULAR | Status: AC
  Filled 2018-03-08: qty 2

## 2018-03-08 MED ORDER — MIDAZOLAM HCL 2 MG/2ML IJ SOLN
INTRAMUSCULAR | Status: AC | PRN
  Administered 2018-03-08 (×2): 1 mg via INTRAVENOUS

## 2018-03-08 MED ORDER — LIDOCAINE HCL 1 % IJ SOLN
INTRAMUSCULAR | Status: AC
  Filled 2018-03-08: qty 20

## 2018-03-08 MED ORDER — SODIUM CHLORIDE 0.9 % IV SOLN: INTRAVENOUS | Status: DC

## 2018-03-08 MED ORDER — MIDAZOLAM HCL 2 MG/2ML IJ SOLN
INTRAMUSCULAR | Status: AC
  Filled 2018-03-08: qty 2

## 2018-03-08 MED ORDER — FENTANYL CITRATE (PF) 100 MCG/2ML IJ SOLN
INTRAMUSCULAR | Status: AC | PRN
  Administered 2018-03-08 (×2): 50 ug via INTRAVENOUS

## 2018-03-08 NOTE — Discharge Instructions (Signed)
**Note -Identified via Obfuscation** Care After Biopsy These instructions give you information about caring for yourself after your procedure. Your doctor may also give you more specific instructions. Call your doctor if you have any problems or questions after your procedure. Follow these instructions at home:  Rest as told by your doctor.  Take medicines only as told by your doctor.  There are many different ways to close and cover the biopsy site, including stitches (sutures), skin glue, and adhesive strips. Follow instructions from your doctor about: ? How to take care of your biopsy site. ? When and how you should change your bandage (dressing). ? When you should remove your dressing. ? Removing whatever was used to close your biopsy site.  Check your biopsy site every day for signs of infection. Watch for: ? Redness, swelling, or pain. ? Fluid, blood, or pus. Contact a doctor if:  You have a fever.  You have redness, swelling, or pain at the biopsy site, and it lasts longer than a few days.  You have fluid, blood, or pus coming from the biopsy site.  You feel sick to your stomach (nauseous).  You throw up (vomit). Get help right away if:  You are short of breath.  You have trouble breathing.  Your chest hurts.  You feel dizzy or you pass out (faint).  You have bleeding that does not stop with pressure or a bandage.  You cough up blood.  Your belly (abdomen) hurts. This information is not intended to replace advice given to you by your health care provider. Make sure you discuss any questions you have with your health care provider. Document Released: 08/11/2008 Document Revised: 02/04/2016 Document Reviewed: 08/25/2014 Elsevier Interactive Patient Education  [employer].

## 2018-03-08 NOTE — Procedures (Signed)
Pre Procedure Dx: Posterior Cervical Mass Post Procedural Dx: Same  Technically successful US guided biopsy of indeterminate mass within the posterior aspect of the neck  EBL: None  No immediate complications.   Ronny Bacon, MD Pager #: [phone]

## 2018-03-08 NOTE — H&P (Signed)
Chief Complaint: Patient was seen in consultation today for posterior cervical paraspinous mass biopsy at the request of Rebecca Lucas  Referring Physician(s): Rebecca Lucas Rebecca Mayme Genta  Supervising Physician: Rebecca Lucas  Patient Status: Rebecca Lucas - Out-pt  History of Present Illness: Rebecca Lucas is a 62 y.o. female   Neck pain since Memorial Day Treated with NSAIDS and better-- for while Worsened and Neck Xray was abnormal MRI then PET scan PET 6/24:  IMPRESSION: 1. Multiorgan intensely hypermetabolic metastatic disease. Favor primary small cell lung cancer versus less favored melanoma. 2. Large RIGHT upper lobe mass measures 5.7 cm and is favored primary lesion. 3. Intensely hypermetabolic posterior paraspinal mass involving the musculature and spinous processes of the upper cervical spine. 4. Small cutaneous lesion posterior to the RIGHT occipital bone is favor metastatic deposit. 5. Additional metastatic lesions in the LEFT adrenal gland and intracostal muscles of the posterior RIGHT lower ribs. Soft tissue metastasis dorsal to the LEFT psoas muscle. 6. Skeletal metastasis evident within the RIGHT sacral ala and LEFT Ischium.  Referred to Rebecca Lucas Note 6/25:   ASSESSMENT: This is a very pleasant 62 years old never smoker white female with likely metastatic neoplasm concerning for non-small cell lung cancer probably adenocarcinoma but melanoma could not be excluded in this patient especially with the subcutaneous lesions in the scalp and muscles.  Now scheduled for cervical paraspinous mass biopsy   History reviewed. No pertinent past medical history.  History reviewed. No pertinent surgical history.  Allergies: Patient has no known allergies.  Medications: Prior to Admission medications   Medication Sig Start Date End Date Taking? Authorizing Provider  acetaminophen (TYLENOL) 500 MG tablet Take 1,000 mg by mouth every 6 (six) hours as needed.   Yes  [provider]  alendronate (FOSAMAX) 70 MG tablet Take 70 mg by mouth once a week. Wednesday 01/12/18  Yes [provider]  Calcium Carbonate-Vitamin D (CALCIUM-D PO) Take 1 tablet by mouth 2 (two) times daily.   Yes [provider]  ibuprofen (ADVIL,MOTRIN) 200 MG tablet Take 600 mg by mouth every 6 (six) hours as needed.   Yes [provider]  traMADol (ULTRAM) 50 MG tablet Take 50 mg by mouth 4 (four) times daily as needed. 03/05/18  Yes [provider]  zolpidem (AMBIEN) 10 MG tablet Take 10 mg by mouth at bedtime as needed. 12/05/17  Yes [provider]  butalbital-aspirin-caffeine Acquanetta Chain) 50-325-40 MG capsule Take 1 capsule by mouth every 6 (six) hours as needed. 12/05/17   [provider]     Family History  Problem Relation Age of Onset  . ALS Mother 69       non genetic  . Cancer Neg Hx     Social History   Socioeconomic History  . Marital status: Married    Spouse name: Not on file  . Number of children: Not on file  . Years of education: Not on file  . Highest education level: Not on file  Occupational History  . Not on file  Social Needs  . Financial resource strain: Not on file  . Food insecurity:    Worry: Not on file    Inability: Not on file  . Transportation needs:    Medical: Not on file    Non-medical: Not on file  Tobacco Use  . Smoking status: Never Smoker  . Smokeless tobacco: Never Used  Substance and Sexual Activity  . Alcohol use: Yes    Comment: occasionally  .  Drug use: Never  . Sexual activity: Yes  Lifestyle  . Physical activity:    Days per week: Not on file    Minutes per session: Not on file  . Stress: Not on file  Relationships  . Social connections:    Talks on phone: Not on file    Gets together: Not on file    Attends religious service: Not on file    Active member of club or organization: Not on file    Attends meetings of clubs or organizations: Not on file     Relationship status: Not on file  Other Topics Concern  . Not on file  Social History Narrative   Married with two children. Her husband is Rebecca. Odis Lucas, a physician with occupational health. She has worked several jobs Energy manager, Glass blower/designer, and now the [employer].     Review of Systems: A 12 point ROS discussed and pertinent positives are indicated in the HPI above.  All other systems are negative.  Review of Systems  Constitutional: Positive for activity change. Negative for fatigue and fever.  Respiratory: Negative for cough and shortness of breath.   Cardiovascular: Negative for chest pain.  Gastrointestinal: Negative for abdominal pain.  Musculoskeletal: Positive for neck pain and neck stiffness.  Neurological: Negative for weakness.  Psychiatric/Behavioral: Negative for behavioral problems and confusion.    Vital Signs: BP (!) 104/59   Pulse 85   Temp 98.7 F (37.1 C) (Oral)   Ht 5\' 7"  (1.702 m)   Wt 131 lb (59.4 kg)   SpO2 99%   BMI 20.52 kg/m   Physical Exam  Constitutional: She is oriented to person, place, and time.  Neck:  Palpable mass posterior neck  Cardiovascular: Normal rate, regular rhythm and normal heart sounds.  Pulmonary/Chest: Effort normal and breath sounds normal.  Abdominal: Soft. Bowel sounds are normal.  Musculoskeletal: Normal range of motion.  Neurological: She is alert and oriented to person, place, and time.  Skin: Skin is warm and dry.  Psychiatric: She has a normal mood and affect. Her behavior is normal. Judgment and thought content normal.  Nursing note and vitals reviewed.   Imaging: Dg Cervical Spine Complete  Result Date: 02/22/2018 CLINICAL DATA:  New onset neck pain for 5 weeks. EXAM: CERVICAL SPINE - COMPLETE 4+ VIEW COMPARISON:  None. FINDINGS: There is no evidence of cervical spine fracture or prevertebral soft tissue swelling. Alignment is normal. Degenerative disc disease with disc height  loss at C5-6. Expansile ill-defined bone lesion in the posterior aspect of the C2 spinous process with bone destruction which appears also involve C3 spinous process. IMPRESSION: Expansile ill-defined bone lesion in the posterior aspect of the C2 spinous process with bone destruction which appears also involve C3 spinous process. The appearance is most concerning for malignancy until proven otherwise. Recommend further evaluation with a CT or MRI of the cervical spine. No acute osseous injury of the cervical spine. These results will be called to the ordering clinician or representative by the Radiologist Assistant, and communication documented in the PACS or zVision Dashboard. Electronically Signed   By: Kathreen Devoid   On: 02/22/2018 14:18   Mr Cervical Spine Wo Contrast  Result Date: 02/25/2018 CLINICAL DATA:  Neck pain for 6 weeks.  Follow-up C2 lesion. EXAM: MRI CERVICAL SPINE WITHOUT CONTRAST TECHNIQUE: Multiplanar, multisequence MR imaging of the cervical spine was performed. No intravenous contrast was administered. COMPARISON:  Cervical spine radiographs February 22, 2018 FINDINGS: ALIGNMENT: Maintained  cervical lordosis.  No malalignment. VERTEBRAE/DISCS: Expansile 3.6 x 5 x 5.8 cm (AP by transverse by cc) soft tissue mass arising from the posterior elements of C2, symmetrically invading the paraspinal soft tissues and dorsal epidural space. Low T1 and bright STIR signal ventral C2. Vertebral bodies otherwise intact. Moderate to severe C4-5 and C5 disc height loss with mild chronic discogenic endplate changes. CORD:Cervical spinal cord is normal morphology and signal characteristics from the cervicomedullary junction to level of T2-3, the most caudal well visualized level. POSTERIOR FOSSA, VERTEBRAL ARTERIES, PARASPINAL TISSUES: No MR findings of ligamentous injury. Vertebral artery flow voids present. Included posterior fossa and paraspinal soft tissues are normal. DISC LEVELS: C2-3: Tumor within the  dorsal epidural space effaces the posterior thecal sac, AP dimension of the canal is 10 mm. No neural foraminal narrowing. Mild facet arthropathy. C3-4: Small LEFT central disc protrusion, uncovertebral hypertrophy and mild facet arthropathy. No canal stenosis or neural foraminal narrowing. C4-5: Small broad-based disc bulge, uncovertebral hypertrophy and mild facet arthropathy. No canal stenosis. Minimal RIGHT neural foraminal narrowing. C5-6: Small broad-based disc bulge, uncovertebral hypertrophy and mild-to-moderate facet arthropathy. No canal stenosis. Mild bilateral neural foraminal narrowing. C6-7: Small broad-based disc bulge and central disc protrusion without canal stenosis or neural foraminal narrowing. C7-T1: No disc bulge, canal stenosis nor neural foraminal narrowing. IMPRESSION: 1. 3.6 x 5 x 5.8 cm soft tissue mass arising from posterior elements of C2 with paraspinal muscle and dorsal epidural invasion. Probable tumor within ventral C2. Differential diagnosis includes plasmacytoma and metastatic disease. Recommend PET-CT and/or sampling. 2. Degenerative cervical spine. No canal stenosis. Minimal to mild C4-5 and C5-6 neural foraminal narrowing. 3. These results will be called to the ordering clinician or representative by the Radiologist Assistant, and communication documented in the PACS or zVision Dashboard. Electronically Signed   By: Elon Alas M.D.   On: 02/25/2018 02:47   Nm Pet Image Initial (pi) Skull Base To Thigh  Result Date: 03/05/2018 CLINICAL DATA:  Initial treatment strategy for paraspinal mass along the upper cervical spine EXAM: NUCLEAR MEDICINE PET SKULL BASE TO THIGH TECHNIQUE: 6.4 mCi F-18 FDG was injected intravenously. Full-ring PET imaging was performed from the skull base to thigh after the radiotracer. CT data was obtained and used for attenuation correction and anatomic localization. Fasting blood glucose: 101 mg/dl COMPARISON:  MRI 02/24/2018 FINDINGS:  Mediastinal blood pool activity: SUV max 2.0 NECK: Intensely hypermetabolic paraspinal mass posterior to the C2, C3, and C4 vertebral bodies. Mass corresponds to mass lesion identified on comparison MRI. Mass extremely hypermetabolic active with SUV max equal 40. There is involvement of the spinous process at C2 and C3. Mild probable epidural extension of the tumor. There is a cutaneous lesion in the scalp posterior to the RIGHT occipital bone with SUV max equal 21.2 (image 10 of the fused data set. Incidental CT findings: none CHEST: Large intensely hypermetabolic mass in the RIGHT upper lobe measures 5.7 cm. (SUV max equal 28.4). There several adjacent satellite nodules upra inferior to the mass which are also intensely hypermetabolic. Hypermetabolic precarinal lymph nodes and RIGHT para lower tracheal lymph nodes with SUV max equal 7.6. The more distant nodule in the RIGHT lower lobe just above the RIGHT hemidiaphragm is also hypermetabolic Incidental CT findings: none ABDOMEN/PELVIS: Hypermetabolic enlargement of the LEFT adrenal gland to 1.8 cm with SUV max equal 6.5. Hypermetabolic lesion along the posterior aspect the RIGHT hepatic lobe is within the intercostal musculature (SUV max equal 19.1, image 111) Dorsal to the  LEFT psoas muscle, at the superior aspect of the SI joint, is a soft tissue mass measuring 1.7 cm with SUV max equal 34.8. Incidental CT findings: Uterus and ovaries are normal. Kidneys normal. Pancreas normal. SKELETON: Skeletal metastasis within the RIGHT sacral ala with SUV max equal 32. subtle ground-glass lesion measuring 20 mm is present on image 151/4. Additional metastatic lesion in the posterior aspect of the LEFT iliac bone with SUV max equal 26. Additional metastatic lesion in the LEFT gluteus muscle which difficult to see on the CT portion exam present tense with SUV max equal 24. Incidental CT findings: none IMPRESSION: 1. Multiorgan intensely hypermetabolic metastatic disease.  Favor primary small cell lung cancer versus less favored melanoma. 2. Large RIGHT upper lobe mass measures 5.7 cm and is favored primary lesion. 3. Intensely hypermetabolic posterior paraspinal mass involving the musculature and spinous processes of the upper cervical spine. 4. Small cutaneous lesion posterior to the RIGHT occipital bone is favor metastatic deposit. 5. Additional metastatic lesions in the LEFT adrenal gland and intracostal muscles of the posterior RIGHT lower ribs. Soft tissue metastasis dorsal to the LEFT psoas muscle. 6. Skeletal metastasis evident within the RIGHT sacral ala and LEFT ischium. If multidisciplinary follow up management is desired, this is available in the [facility] through the [facility] [phone]. Electronically Signed   By: Suzy Bouchard M.D.   On: 03/05/2018 14:37    Labs:  CBC: Recent Labs    03/06/18 1128 03/08/18 0936  WBC 12.5* 11.9*  HGB 12.3 11.5*  HCT 38.1 36.2  PLT 419* 369    COAGS: No results for input(s): INR, APTT in the last 8760 hours.  BMP: Recent Labs    03/06/18 1128  NA 138  K 5.1  CL 103  CO2 26  GLUCOSE 80  BUN 9  CALCIUM 10.0  CREATININE 0.75  GFRNONAA >60  GFRAA >60    LIVER FUNCTION TESTS: Recent Labs    03/06/18 1128  BILITOT 0.2*  AST 33  ALT 49*  ALKPHOS 93  PROT 7.7  ALBUMIN 3.5    TUMOR MARKERS: No results for input(s): AFPTM, CEA, CA199, CHROMGRNA in the last 8760 hours.  Assessment and Plan:  Neck pain Neck mass - paraspinous mass +PET Metastatic disease on PET Now scheduled for cervical paraspinous mass bx Risks and benefits discussed with the patient including, but not limited to bleeding, infection, damage to adjacent structures or low yield requiring additional tests.  All of the patient's questions were answered, patient is agreeable to proceed. Consent signed and in chart.   Thank you for this interesting consult.  I greatly enjoyed meeting  BELLATRIX DEVONSHIRE and look forward to participating in their care.  A copy of this report was sent to the requesting provider on this date.  Electronically Signed: Lavonia Drafts, PA-C 03/08/2018, 10:20 AM   I spent a total of  30 Minutes   in face to face in clinical consultation, greater than 50% of which was counseling/coordinating care for cervical paraspinous mass bx

## 2018-03-09 ENCOUNTER — Telehealth: Payer: Self-pay | Admitting: *Deleted

## 2018-03-09 ENCOUNTER — Ambulatory Visit (HOSPITAL_COMMUNITY)
Admission: RE | Admit: 2018-03-09 | Discharge: 2018-03-09 | Disposition: A | Discharge status: Home / Self Care | Payer: 59 | Source: Ambulatory Visit | Attending: Family Medicine | Admitting: Family Medicine

## 2018-03-09 DIAGNOSIS — C7951 Secondary malignant neoplasm of bone: Secondary | ICD-10-CM | POA: Insufficient documentation

## 2018-03-09 DIAGNOSIS — R220 Localized swelling, mass and lump, head: Secondary | ICD-10-CM | POA: Diagnosis not present

## 2018-03-09 DIAGNOSIS — C3490 Malignant neoplasm of unspecified part of unspecified bronchus or lung: Secondary | ICD-10-CM | POA: Diagnosis present

## 2018-03-09 DIAGNOSIS — C412 Malignant neoplasm of vertebral column: Secondary | ICD-10-CM | POA: Diagnosis not present

## 2018-03-09 MED ORDER — GADOBENATE DIMEGLUMINE 529 MG/ML IV SOLN
15.0000 mL | Freq: Once | INTRAVENOUS | Status: AC | PRN
  Administered 2018-03-09: 12 mL via INTRAVENOUS

## 2018-03-09 NOTE — Telephone Encounter (Signed)
Oncology Nurse Navigator Documentation  Oncology Nurse Navigator Flowsheets 03/09/2018  Navigator Location [facility]  Navigator Encounter Type Telephone/per Dr. Julien Nordmann I called patient to schedule her to be seen on 03/13/18 at 12:00, no labs ordered per Dr. Julien Nordmann.   Telephone Outgoing Call  Treatment Phase Pre-Tx/Tx Discussion  Barriers/Navigation Needs Coordination of Care  Interventions Coordination of Care  Coordination of Care Appts  Acuity Level 2  Time Spent with Patient 30

## 2018-03-12 ENCOUNTER — Telehealth: Payer: Self-pay | Admitting: *Deleted

## 2018-03-12 MED FILL — traMADol HCL 50 MG TABS: 50 | 7 days supply | Qty: 28 | Fill #0

## 2018-03-12 NOTE — Telephone Encounter (Signed)
Oncology Nurse Navigator Documentation  Oncology Nurse Navigator Flowsheets 03/12/2018  Navigator Location [facility]  Navigator Encounter Type Telephone/per Dr. Julien Nordmann, he requested I call patient to update her pathology is not completed and appt has been changed to Wednesday of this week.  Patient verbalized understanding of appt time and place.   Telephone Outgoing Call  Treatment Phase Pre-Tx/Tx Discussion  Barriers/Navigation Needs Coordination of Care  Interventions Coordination of Care  Coordination of Care Appts  Acuity Level 2  Time Spent with Patient 30

## 2018-03-13 ENCOUNTER — Other Ambulatory Visit: Payer: Self-pay | Admitting: Internal Medicine

## 2018-03-13 ENCOUNTER — Ambulatory Visit
Admission: RE | Admit: 2018-03-13 | Discharge: 2018-03-13 | Disposition: A | Discharge status: Home / Self Care | Payer: 59 | Source: Ambulatory Visit | Attending: Radiation Oncology | Admitting: Radiation Oncology

## 2018-03-13 ENCOUNTER — Inpatient Hospital Stay: Payer: 59 | Admitting: Internal Medicine

## 2018-03-13 DIAGNOSIS — C7951 Secondary malignant neoplasm of bone: Secondary | ICD-10-CM

## 2018-03-13 DIAGNOSIS — C3490 Malignant neoplasm of unspecified part of unspecified bronchus or lung: Secondary | ICD-10-CM | POA: Diagnosis not present

## 2018-03-13 DIAGNOSIS — Z510 Encounter for antineoplastic radiation therapy: Secondary | ICD-10-CM | POA: Insufficient documentation

## 2018-03-13 DIAGNOSIS — C3411 Malignant neoplasm of upper lobe, right bronchus or lung: Secondary | ICD-10-CM | POA: Diagnosis not present

## 2018-03-13 DIAGNOSIS — C7981 Secondary malignant neoplasm of breast: Secondary | ICD-10-CM | POA: Insufficient documentation

## 2018-03-14 ENCOUNTER — Encounter: Payer: Self-pay | Admitting: *Deleted

## 2018-03-14 ENCOUNTER — Encounter: Payer: Self-pay | Admitting: Internal Medicine

## 2018-03-14 ENCOUNTER — Inpatient Hospital Stay: Payer: 59 | Attending: Internal Medicine | Admitting: Internal Medicine

## 2018-03-14 DIAGNOSIS — Z5111 Encounter for antineoplastic chemotherapy: Secondary | ICD-10-CM | POA: Diagnosis not present

## 2018-03-14 DIAGNOSIS — Z5112 Encounter for antineoplastic immunotherapy: Secondary | ICD-10-CM | POA: Insufficient documentation

## 2018-03-14 DIAGNOSIS — C7951 Secondary malignant neoplasm of bone: Secondary | ICD-10-CM | POA: Insufficient documentation

## 2018-03-14 DIAGNOSIS — C7801 Secondary malignant neoplasm of right lung: Secondary | ICD-10-CM | POA: Insufficient documentation

## 2018-03-14 DIAGNOSIS — C801 Malignant (primary) neoplasm, unspecified: Secondary | ICD-10-CM | POA: Diagnosis not present

## 2018-03-14 DIAGNOSIS — Z79899 Other long term (current) drug therapy: Secondary | ICD-10-CM | POA: Diagnosis not present

## 2018-03-14 DIAGNOSIS — C7989 Secondary malignant neoplasm of other specified sites: Secondary | ICD-10-CM | POA: Insufficient documentation

## 2018-03-14 DIAGNOSIS — G893 Neoplasm related pain (acute) (chronic): Secondary | ICD-10-CM | POA: Diagnosis not present

## 2018-03-14 DIAGNOSIS — C799 Secondary malignant neoplasm of unspecified site: Secondary | ICD-10-CM

## 2018-03-14 MED ORDER — FENTANYL 25 MCG/HR TD PT72: 25.0000 ug | MEDICATED_PATCH | TRANSDERMAL | 0 refills | Status: DC

## 2018-03-14 MED FILL — fentaNYL 25 MCG/HR PT72: 25 | 30 days supply | Qty: 10 | Fill #0

## 2018-03-14 NOTE — Progress Notes (Signed)
Oncology Nurse Navigator Documentation  Oncology Nurse Navigator Flowsheets 03/14/2018  Navigator Location [facility]  Navigator Encounter Type Other/per Dr. Julien Nordmann I called pathology to get an update on pathology for patient.  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    Treatment Phase Pre-Tx/Tx Discussion  Barriers/Navigation Needs Coordination of Care  Interventions Coordination of Care  Coordination of Care Other  Acuity Level 2  Time Spent with Patient 30

## 2018-03-14 NOTE — Progress Notes (Signed)
[facility] [phone]   [phone]  OFFICE PROGR[place] [id number]  DIAGNOSIS: Metastatic poorly differentiated neoplasm, pending further immunohistochemical stains and testing presented with large right upper lobe lung mass in addition to right neck and bone metastasis diagnosed and June 2019.  PRIOR THERAPY: None  CURRENT THERAPY: None  INTERVAL HISTORY: Rebecca Lucas 62 y.o. female returns to the clinic today for follow-up visit accompanied by her husband.  The patient is feeling fine today with no specific complaints except for the persistent neck pain.  She was seen by Dr. Tammi Klippel and expected to start palliative radiotherapy next week.  The patient underwent CT-guided core biopsy of the neck mass and the pathology is not clear at this point but suspicious for poorly differentiated neoplasm.  It was not conclusive for lung cancer or melanoma.  The patient also has some mild dry cough and mild pain on the right side of the chest.  She denied having any recent weight loss or night sweats.  She has no nausea, vomiting, diarrhea or constipation.  She is using tramadol as well as a lot of Tylenol and ibuprofen for management of her pain.  She is here today for evaluation and recommendation regarding her condition.  She had MRI of the brain that showed no evidence of metastatic disease to the brain.  MEDICAL HISTORY:No past medical history on file.  ALLERGIES:  has No Known Allergies.  MEDICATIONS:  Current Outpatient Medications  Medication Sig Dispense Refill  . acetaminophen (TYLENOL) 500 MG tablet Take 1,000 mg by mouth every 6 (six) hours as needed.    . butalbital-aspirin-caffeine (FIORINAL) 50-325-40 MG capsule Take 1 capsule by mouth every 6 (six) hours as needed.  1  . Calcium Carbonate-Vitamin D (CALCIUM-D PO) Take 1 tablet by mouth 2 (two) times daily.    Marland Kitchen ibuprofen  (ADVIL,MOTRIN) 200 MG tablet Take 600 mg by mouth every 6 (six) hours as needed.    . traMADol (ULTRAM) 50 MG tablet Take 50 mg by mouth 4 (four) times daily as needed.  0  . zolpidem (AMBIEN) 10 MG tablet Take 10 mg by mouth at bedtime as needed.  0  . alendronate (FOSAMAX) 70 MG tablet Take 70 mg by mouth once a week. Wednesday  3   No current facility-administered medications for this visit.     SURGICAL HISTORY: No past surgical history on file.  REVIEW OF SYSTEMS:  Constitutional: positive for fatigue Eyes: negative Ears, nose, mouth, throat, and face: negative Respiratory: positive for cough Cardiovascular: negative Gastrointestinal: negative Genitourinary:negative Integument/breast: negative Hematologic/lymphatic: negative Musculoskeletal:positive for neck pain Neurological: negative Behavioral/Psych: negative Endocrine: negative Allergic/Immunologic: negative   PHYSICAL EXAMINATION: General appearance: alert, cooperative and no distress Head: Normocephalic, without obvious abnormality, atraumatic Neck: no adenopathy, no JVD, supple, symmetrical, trachea midline and thyroid not enlarged, symmetric, no tenderness/mass/nodules Lymph nodes: Cervical, supraclavicular, and axillary nodes normal. Resp: clear to auscultation bilaterally Back: symmetric, no curvature. ROM normal. No CVA tenderness. Cardio: regular rate and rhythm, S1, S2 normal, no murmur, click, rub or gallop GI: soft, non-tender; bowel sounds normal; no masses,  no organomegaly Extremities: extremities normal, atraumatic, no cyanosis or edema Neurologic: Alert and oriented X 3, normal strength and tone. Normal symmetric reflexes. Normal coordination and gait  ECOG PERFORMANCE STATUS: 1 - Symptomatic but completely ambulatory  There were no vitals taken for this visit.  LABORATORY DATA: Lab  Results  Component Value Date   WBC 11.9 (H) 03/08/2018   HGB 11.5 (L) 03/08/2018   HCT 36.2 03/08/2018   MCV 88.1  03/08/2018   PLT 369 03/08/2018      Chemistry      Component Value Date/Time   NA 138 03/06/2018 1128   K 5.1 03/06/2018 1128   CL 103 03/06/2018 1128   CO2 26 03/06/2018 1128   BUN 9 03/06/2018 1128   CREATININE 0.75 03/06/2018 1128      Component Value Date/Time   CALCIUM 10.0 03/06/2018 1128   ALKPHOS 93 03/06/2018 1128   AST 33 03/06/2018 1128   ALT 49 (H) 03/06/2018 1128   BILITOT 0.2 (L) 03/06/2018 1128       RADIOGRAPHIC STUDIES: Dg Cervical Spine Complete  Result Date: 02/22/2018 CLINICAL DATA:  New onset neck pain for 5 weeks. EXAM: CERVICAL SPINE - COMPLETE 4+ VIEW COMPARISON:  None. FINDINGS: There is no evidence of cervical spine fracture or prevertebral soft tissue swelling. Alignment is normal. Degenerative disc disease with disc height loss at C5-6. Expansile ill-defined bone lesion in the posterior aspect of the C2 spinous process with bone destruction which appears also involve C3 spinous process. IMPRESSION: Expansile ill-defined bone lesion in the posterior aspect of the C2 spinous process with bone destruction which appears also involve C3 spinous process. The appearance is most concerning for malignancy until proven otherwise. Recommend further evaluation with a CT or MRI of the cervical spine. No acute osseous injury of the cervical spine. These results will be called to the ordering clinician or representative by the Radiologist Assistant, and communication documented in the PACS or zVision Dashboard. Electronically Signed   By: Kathreen Devoid   On: 02/22/2018 14:18   Mr Brain W Wo Contrast  Result Date: 03/09/2018 CLINICAL DATA:  Diffuse metastatic disease of unknown primary, possibly small-cell lung cancer. Staging. EXAM: MRI HEAD WITHOUT AND WITH CONTRAST TECHNIQUE: Multiplanar, multiecho pulse sequences of the brain and surrounding structures were obtained without and with intravenous contrast. CONTRAST:  66mL MULTIHANCE GADOBENATE DIMEGLUMINE 529 MG/ML IV  SOLN COMPARISON:  Cervical spine MRI 02/24/2018. PET-CT 03/05/2018. No prior brain imaging. FINDINGS: Brain: There is no evidence of acute infarct, intracranial hemorrhage, mass, midline shift, or extra-axial fluid collection. Small foci of T2 hyperintensity are scattered throughout the predominantly subcortical cerebral white matter bilaterally, mild-to-moderate for age. No abnormal brain parenchymal or meningeal enhancement suggestive of metastatic disease is identified. A small developmental venous anomaly is incidentally noted in the left parietal lobe. Vascular: Major intracranial vascular flow voids are preserved. Skull and upper cervical spine: Destructive mass involving the C2 posterior elements with dorsal epidural extension as described on the prior cervical spine MRI. Abnormal marrow signal in the anteroinferior C2 vertebral body as previously seen also concerning for metastasis. Sinuses/Orbits: Unremarkable orbits. Paranasal sinuses and mastoid air cells are clear. Other: 1.5 cm enhancing subcutaneous nodule in the right suboccipital region, hypermetabolic on PET-CT. IMPRESSION: 1. No evidence of intracranial metastatic disease. 2. 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 3. 1.5 cm right suboccipital subcutaneous nodule suspicious for metastasis. 4. Mild-to-moderate cerebral white matter T2 signal changes, nonspecific though most often seen with chronic small vessel ischemia. Electronically Signed   By: Logan Bores M.D.   On: 03/09/2018 09:45   Mr Cervical Spine Wo Contrast  Result Date: 02/25/2018 CLINICAL DATA:  Neck pain for 6 weeks.  Follow-up C2 lesion. EXAM: MRI CERVICAL SPINE WITHOUT CONTRAST TECHNIQUE: Multiplanar, multisequence MR imaging of  the cervical spine was performed. No intravenous contrast was administered. COMPARISON:  Cervical spine radiographs February 22, 2018 FINDINGS: ALIGNMENT: Maintained cervical lordosis.  No malalignment. VERTEBRAE/DISCS: Expansile 3.6 x 5 x  5.8 cm (AP by transverse by cc) soft tissue mass arising from the posterior elements of C2, symmetrically invading the paraspinal soft tissues and dorsal epidural space. Low T1 and bright STIR signal ventral C2. Vertebral bodies otherwise intact. Moderate to severe C4-5 and C5 disc height loss with mild chronic discogenic endplate changes. CORD:Cervical spinal cord is normal morphology and signal characteristics from the cervicomedullary junction to level of T2-3, the most caudal well visualized level. POSTERIOR FOSSA, VERTEBRAL ARTERIES, PARASPINAL TISSUES: No MR findings of ligamentous injury. Vertebral artery flow voids present. Included posterior fossa and paraspinal soft tissues are normal. DISC LEVELS: C2-3: Tumor within the dorsal epidural space effaces the posterior thecal sac, AP dimension of the canal is 10 mm. No neural foraminal narrowing. Mild facet arthropathy. C3-4: Small LEFT central disc protrusion, uncovertebral hypertrophy and mild facet arthropathy. No canal stenosis or neural foraminal narrowing. C4-5: Small broad-based disc bulge, uncovertebral hypertrophy and mild facet arthropathy. No canal stenosis. Minimal RIGHT neural foraminal narrowing. C5-6: Small broad-based disc bulge, uncovertebral hypertrophy and mild-to-moderate facet arthropathy. No canal stenosis. Mild bilateral neural foraminal narrowing. C6-7: Small broad-based disc bulge and central disc protrusion without canal stenosis or neural foraminal narrowing. C7-T1: No disc bulge, canal stenosis nor neural foraminal narrowing. IMPRESSION: 1. 3.6 x 5 x 5.8 cm soft tissue mass arising from posterior elements of C2 with paraspinal muscle and dorsal epidural invasion. Probable tumor within ventral C2. Differential diagnosis includes plasmacytoma and metastatic disease. Recommend PET-CT and/or sampling. 2. Degenerative cervical spine. No canal stenosis. Minimal to mild C4-5 and C5-6 neural foraminal narrowing. 3. These results will be  called to the ordering clinician or representative by the Radiologist Assistant, and communication documented in the PACS or zVision Dashboard. Electronically Signed   By: Elon Alas M.D.   On: 02/25/2018 02:47   Nm Pet Image Initial (pi) Skull Base To Thigh  Result Date: 03/05/2018 CLINICAL DATA:  Initial treatment strategy for paraspinal mass along the upper cervical spine EXAM: NUCLEAR MEDICINE PET SKULL BASE TO THIGH TECHNIQUE: 6.4 mCi F-18 FDG was injected intravenously. Full-ring PET imaging was performed from the skull base to thigh after the radiotracer. CT data was obtained and used for attenuation correction and anatomic localization. Fasting blood glucose: 101 mg/dl COMPARISON:  MRI 02/24/2018 FINDINGS: Mediastinal blood pool activity: SUV max 2.0 NECK: Intensely hypermetabolic paraspinal mass posterior to the C2, C3, and C4 vertebral bodies. Mass corresponds to mass lesion identified on comparison MRI. Mass extremely hypermetabolic active with SUV max equal 40. There is involvement of the spinous process at C2 and C3. Mild probable epidural extension of the tumor. There is a cutaneous lesion in the scalp posterior to the RIGHT occipital bone with SUV max equal 21.2 (image 10 of the fused data set. Incidental CT findings: none CHEST: Large intensely hypermetabolic mass in the RIGHT upper lobe measures 5.7 cm. (SUV max equal 28.4). There several adjacent satellite nodules upra inferior to the mass which are also intensely hypermetabolic. Hypermetabolic precarinal lymph nodes and RIGHT para lower tracheal lymph nodes with SUV max equal 7.6. The more distant nodule in the RIGHT lower lobe just above the RIGHT hemidiaphragm is also hypermetabolic Incidental CT findings: none ABDOMEN/PELVIS: Hypermetabolic enlargement of the LEFT adrenal gland to 1.8 cm with SUV max equal 6.5. Hypermetabolic lesion along  the posterior aspect the RIGHT hepatic lobe is within the intercostal musculature (SUV max equal  19.1, image 111) Dorsal to the LEFT psoas muscle, at the superior aspect of the SI joint, is a soft tissue mass measuring 1.7 cm with SUV max equal 34.8. Incidental CT findings: Uterus and ovaries are normal. Kidneys normal. Pancreas normal. SKELETON: Skeletal metastasis within the RIGHT sacral ala with SUV max equal 32. subtle ground-glass lesion measuring 20 mm is present on image 151/4. Additional metastatic lesion in the posterior aspect of the LEFT iliac bone with SUV max equal 26. Additional metastatic lesion in the LEFT gluteus muscle which difficult to see on the CT portion exam present tense with SUV max equal 24. Incidental CT findings: none IMPRESSION: 1. Multiorgan intensely hypermetabolic metastatic disease. Favor primary small cell lung cancer versus less favored melanoma. 2. Large RIGHT upper lobe mass measures 5.7 cm and is favored primary lesion. 3. Intensely hypermetabolic posterior paraspinal mass involving the musculature and spinous processes of the upper cervical spine. 4. Small cutaneous lesion posterior to the RIGHT occipital bone is favor metastatic deposit. 5. Additional metastatic lesions in the LEFT adrenal gland and intracostal muscles of the posterior RIGHT lower ribs. Soft tissue metastasis dorsal to the LEFT psoas muscle. 6. Skeletal metastasis evident within the RIGHT sacral ala and LEFT ischium. If multidisciplinary follow up management is desired, this is available in the [place] [facility] through the [facility] [phone]. Electronically Signed   By: Suzy Bouchard M.D.   On: 03/05/2018 14:37   Ct Biopsy  Result Date: 03/08/2018 INDICATION: Concern for metastatic lung cancer. Please perform image guided biopsy hypermetabolic mass within the paraspinal musculature posterior to the superior/mid aspect of the cervical spine for tissue diagnostic purposes. EXAM: CT AND ULTRASOUND-GUIDED BIOPSY HYPERMETABOLIC MASS WITHIN THE PARASPINAL MUSCULATURE OF THE  CERVICAL SPINE COMPARISON:  PET-CT - 03/05/2018; cervical spine MRI - 02/14/2018 MEDICATIONS: None. ANESTHESIA/SEDATION: Fentanyl 100 mcg IV; Versed 2 mg IV Sedation time: 19 minutes; The patient was continuously monitored during the procedure by the interventional radiology nurse under my direct supervision. CONTRAST:  None. COMPLICATIONS: None immediate. PROCEDURE: Informed consent was obtained from the patient following an explanation of the procedure, risks, benefits and alternatives. A time out was performed prior to the initiation of the procedure. The patient was positioned prone on the CT table and a limited CT was performed for procedural planning demonstrating unchanged appearance of the partially calcified approximately 5.8 x 3.4 cm mass centered within the paraspinal musculatures posterior to the superior/mid cervical spine (image 22, series 2). Table position was marked and the mass was identified sonographically. The procedure was planned. The operative site was prepped and draped in the usual sterile fashion. Under direct ultrasound guidance, appropriate trajectory was confirmed with a 22 gauge spinal needle after the adjacent tissues were anesthetized with 1% Lidocaine with epinephrine. Appropriate positioning was confirmed with a limited CT scan. Next, under direct ultrasound guidance, a 17 gauge coaxial needle was advanced into the peripheral aspect of the mass. Appropriate positioning was confirmed and 6 core needle biopsy samples were obtained with an 18 gauge core needle biopsy device under direct ultrasound guidance. Multiple ultrasound images were saved for procedural documentation purposes. The co-axial needle was removed and hemostasis was achieved with manual compression. A limited postprocedural CT was negative for hemorrhage or additional complication. A dressing was placed. The patient tolerated the procedure well without immediate postprocedural complication. IMPRESSION: Technically  successful ultrasound and CT guided core needle  biopsy of indeterminate hypermetabolic mass within the paraspinal musculatures about the cranial/mid aspect of the posterior cervical spine. Electronically Signed   By: Sandi Mariscal M.D.   On: 03/08/2018 13:16    ASSESSMENT AND PLAN: This is a very pleasant 62 years old white female with metastatic poorly differentiated neoplasm of unknown primary at this point. The patient had MRI of the brain that showed no concerning findings for metastatic disease to the brain. She is expected to start palliative radiotherapy to the neck mass next week under the care of Dr. Tammi Klippel. For pain management I started the patient on fentanyl patch 25 mcg/hour every 3 days.  She will continue on tramadol for breakthrough pain. I had a lengthy discussion with the patient and her husband today about her current condition. Unfortunately her tissue diagnosis is not completely identified at this point to discuss systemic treatment options. I asked the pathologist to send her tissue to cancer type DX as well as to [facility] for molecular studies. I will also arrange for the patient to have a second opinion at [facility] for reevaluation and review of her slides and treatment options. I will arrange for her a follow-up appointment after I receive more information about her pathology. She was advised to call immediately if she has any concerning symptoms in the interval. The patient voices understanding of current disease status and treatment options and is in agreement with the current care plan.  All questions were answered. The patient knows to call the clinic with any problems, questions or concerns. We can certainly see the patient much sooner if necessary.  I spent 15 minutes counseling the patient face to face. The total time spent in the appointment was 25 minutes.  Disclaimer: This note was dictated with voice recognition software. Similar sounding  words can inadvertently be transcribed and may not be corrected upon review.

## 2018-03-15 NOTE — Progress Notes (Signed)
  Radiation Oncology         [phone] ________________________________  Name: Rebecca Lucas MRN: [id number]  Date: 03/13/2018  DOB: 1956/03/01  STEREOTACTIC BODY RADIOTHERAPY SIMULATION AND TREATMENT PLANNING NOTE    ICD-10-CM   1. Bone metastasis ([place]) C79.51   2. 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  Identity was confirmed.  All relevant records and images related to the planned course of therapy were reviewed.  The patient freely provided informed written consent to proceed with treatment after reviewing the details related to the planned course of therapy. The consent form was witnessed and verified by the simulation staff.  Then, the patient was set-up in a stable reproducible  supine position for radiation therapy.  A BodyFix immobilization pillow was fabricated for reproducible positioning.  Surface markings were placed.  The CT images were loaded into the planning software.  The gross target volumes (GTV) and planning target volumes (PTV) were delinieated, and avoidance structures were contoured.  Treatment planning then occurred.  The radiation prescription was entered and confirmed.  A total of two complex treatment devices were fabricated in the form of the BodyFix immobilization pillow and a neck accuform cushion.  I have requested : 3D Simulation  I have requested a DVH of the following structures: targets and all normal structures near the target including pharynx, larynx, brain and spinal cord as noted on the radiation plan to maintain doses in adherence with established limits  PLAN:  The patient will receive 50 Gy in 5 fractions.  ________________________________  Sheral Apley Tammi Klippel, M.D.

## 2018-03-16 ENCOUNTER — Ambulatory Visit: Payer: 59 | Admitting: Internal Medicine

## 2018-03-16 ENCOUNTER — Telehealth: Payer: Self-pay | Admitting: Medical Oncology

## 2018-03-16 NOTE — Telephone Encounter (Signed)
PA placed in managed care box.

## 2018-03-18 ENCOUNTER — Encounter (HOSPITAL_COMMUNITY): Payer: Self-pay | Admitting: Emergency Medicine

## 2018-03-18 ENCOUNTER — Emergency Department (HOSPITAL_COMMUNITY)
Admission: EM | Admit: 2018-03-18 | Discharge: 2018-03-18 | Disposition: A | Discharge status: Home / Self Care | Payer: 59 | Attending: Emergency Medicine | Admitting: Emergency Medicine

## 2018-03-18 ENCOUNTER — Emergency Department (HOSPITAL_COMMUNITY): Payer: 59

## 2018-03-18 DIAGNOSIS — C801 Malignant (primary) neoplasm, unspecified: Secondary | ICD-10-CM

## 2018-03-18 DIAGNOSIS — J168 Pneumonia due to other specified infectious organisms: Secondary | ICD-10-CM | POA: Diagnosis not present

## 2018-03-18 DIAGNOSIS — Z79899 Other long term (current) drug therapy: Secondary | ICD-10-CM | POA: Diagnosis not present

## 2018-03-18 DIAGNOSIS — J181 Lobar pneumonia, unspecified organism: Secondary | ICD-10-CM | POA: Insufficient documentation

## 2018-03-18 DIAGNOSIS — R05 Cough: Secondary | ICD-10-CM | POA: Diagnosis not present

## 2018-03-18 DIAGNOSIS — R0789 Other chest pain: Secondary | ICD-10-CM | POA: Diagnosis not present

## 2018-03-18 DIAGNOSIS — J189 Pneumonia, unspecified organism: Secondary | ICD-10-CM

## 2018-03-18 LAB — CBC WITH DIFFERENTIAL/PLATELET
BASOS ABS: 0.1 10*3/uL (ref 0.0–0.1)
Basophils Relative: 0 %
Eosinophils Absolute: 0.4 10*3/uL (ref 0.0–0.7)
Eosinophils Relative: 3 %
HEMATOCRIT: 37.7 % (ref 36.0–46.0)
Hemoglobin: 12.1 g/dL (ref 12.0–15.0)
LYMPHS PCT: 11 %
Lymphs Abs: 1.5 10*3/uL (ref 0.7–4.0)
MCH: 28.1 pg (ref 26.0–34.0)
MCHC: 32.1 g/dL (ref 30.0–36.0)
MCV: 87.7 fL (ref 78.0–100.0)
MONO ABS: 1.3 10*3/uL — AB (ref 0.1–1.0)
Monocytes Relative: 10 %
NEUTROS ABS: 10 10*3/uL — AB (ref 1.7–7.7)
Neutrophils Relative %: 76 %
PLATELETS: 522 10*3/uL — AB (ref 150–400)
RBC: 4.3 MIL/uL (ref 3.87–5.11)
RDW: 13.6 % (ref 11.5–15.5)
WBC: 13.2 10*3/uL — AB (ref 4.0–10.5)

## 2018-03-18 LAB — BASIC METABOLIC PANEL
ANION GAP: 13 (ref 5–15)
BUN: 8 mg/dL (ref 6–20)
CALCIUM: 9.2 mg/dL (ref 8.9–10.3)
CO2: 28 mmol/L (ref 22–32)
Chloride: 96 mmol/L — ABNORMAL LOW (ref 98–111)
Creatinine, Ser: 0.66 mg/dL (ref 0.44–1.00)
Glucose, Bld: 111 mg/dL — ABNORMAL HIGH (ref 70–99)
POTASSIUM: 4.3 mmol/L (ref 3.5–5.1)
SODIUM: 137 mmol/L (ref 135–145)

## 2018-03-18 LAB — URINALYSIS, ROUTINE W REFLEX MICROSCOPIC
BACTERIA UA: NONE SEEN
BILIRUBIN URINE: NEGATIVE
Glucose, UA: NEGATIVE mg/dL
KETONES UR: NEGATIVE mg/dL
LEUKOCYTES UA: NEGATIVE
NITRITE: NEGATIVE
PH: 7 (ref 5.0–8.0)
Protein, ur: NEGATIVE mg/dL
SPECIFIC GRAVITY, URINE: 1.003 — AB (ref 1.005–1.030)

## 2018-03-18 LAB — I-STAT CG4 LACTIC ACID, ED: Lactic Acid, Venous: 1.38 mmol/L (ref 0.5–1.9)

## 2018-03-18 MED ORDER — IOPAMIDOL (ISOVUE-370) INJECTION 76%
100.0000 mL | Freq: Once | INTRAVENOUS | Status: AC | PRN
  Administered 2018-03-18: 100 mL via INTRAVENOUS

## 2018-03-18 MED ORDER — LEVOFLOXACIN 750 MG PO TABS: 750.0000 mg | ORAL_TABLET | Freq: Every day | ORAL | 0 refills | Status: DC

## 2018-03-18 MED ORDER — LEVOFLOXACIN 750 MG PO TABS
750.0000 mg | ORAL_TABLET | Freq: Once | ORAL | Status: AC
  Administered 2018-03-18: 750 mg via ORAL
  Filled 2018-03-18: qty 1

## 2018-03-18 MED ORDER — ALBUTEROL SULFATE (2.5 MG/3ML) 0.083% IN NEBU
5.0000 mg | INHALATION_SOLUTION | Freq: Once | RESPIRATORY_TRACT | Status: AC
  Administered 2018-03-18: 5 mg via RESPIRATORY_TRACT
  Filled 2018-03-18: qty 6

## 2018-03-18 MED ORDER — IOPAMIDOL (ISOVUE-370) INJECTION 76%
INTRAVENOUS | Status: AC
  Administered 2018-03-18: 18:00:00
  Filled 2018-03-18: qty 100

## 2018-03-18 MED ORDER — SODIUM CHLORIDE 0.9 % IV BOLUS
500.0000 mL | Freq: Once | INTRAVENOUS | Status: AC
  Administered 2018-03-18: 500 mL via INTRAVENOUS

## 2018-03-18 NOTE — ED Triage Notes (Signed)
Patient BIB husband, reports recent diagnosis of mass in neck with further testing to determine diagnosis. Not currently taking chemo or radiation. C/o dry cough and sore throat x2 days. Reports fever since yesterday, decreased with tylenol. Reports productive cough today.

## 2018-03-18 NOTE — Discharge Instructions (Signed)
Please take antibiotics as directed.  Call Dr. Lew Dawes office tomorrow to schedule follow-up.  Return for worsening fevers, shortness of breath, chest pain, worsening cough or any other new or concerning symptoms.

## 2018-03-18 NOTE — ED Provider Notes (Signed)
[facility] DEPT Provider Note   CSN: [id number] Arrival date & time: 03/18/18  1159     History   Chief Complaint Chief Complaint  Patient presents with  . Cough    HPI Rebecca Lucas is a 62 y.o. female.  Rebecca Lucas is a 62 y.o. Female with a history of recently diagnosed cancer of unknown origin at this time, masses noted in the neck, right lung and right hip, who presents to the emergency department for evaluation of 2 days of productive cough and fever.  Patient reports about 4 to 5 days prior to arrival she had some mild symptoms of dry cough and sore throat thought likely to be viral, she was seen by her primary doctor with reassuring evaluation, but over the past 2 days cough is become increasingly productive of yellow to white sputum and patient has had fever, T-max at home yesterday and today was 101, she has been taking Tylenol regularly for fever, last dose at 10 AM today.  Patient reports some right-sided chest discomfort, which has been present since diagnosis of her cancer and is unchanged, but does become somewhat more uncomfortable with coughing.  She denies shortness of breath, but does report some chest tightness on the right side.  She denies any abdominal pain, nausea or vomiting.  No dysuria.  Patient is followed by Dr. Earlie Server with oncology who is working to determine the primary cancer and start treatment, patient scheduled to start radiology over her neck for symptomatic relief next week.     History reviewed. No pertinent past medical history.  Patient Active Problem List   Diagnosis Date Noted  . 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 Metastatic malignant neoplasm ([place]) 03/06/2018  . Mass of upper lobe of right lung 03/06/2018  . Bone metastasis ([place]) 03/06/2018    History reviewed. No pertinent surgical history.   OB History   None      Home Medications    Prior to Admission medications   Medication Sig Start  Date End Date Taking? Authorizing Provider  acetaminophen (TYLENOL) 500 MG tablet Take 1,000 mg by mouth every 6 (six) hours as needed for mild pain.    Yes [provider]  alendronate (FOSAMAX) 70 MG tablet Take 70 mg by mouth once a week. Wednesday 01/12/18  Yes [provider]  butalbital-aspirin-caffeine Acquanetta Chain) 50-325-40 MG capsule Take 1 capsule by mouth every 6 (six) hours as needed for migraine.  12/05/17  Yes [provider]  Calcium Carbonate-Vitamin D (CALCIUM-D PO) Take 1 tablet by mouth 2 (two) times daily.   Yes [provider]  fentaNYL (DURAGESIC - DOSED MCG/HR) 25 MCG/HR patch Place 1 patch (25 mcg total) onto the skin every 3 (three) days. 03/14/18  Yes Curt Bears, MD  ibuprofen (ADVIL,MOTRIN) 200 MG tablet Take 600 mg by mouth every 6 (six) hours as needed for mild pain.    Yes [provider]  LUTEIN PO Take 1 tablet by mouth daily.   Yes [provider]  traMADol (ULTRAM) 50 MG tablet Take 50 mg by mouth 4 (four) times daily as needed (pain).  03/05/18  Yes [provider]  zolpidem (AMBIEN) 10 MG tablet Take 10 mg by mouth at bedtime as needed for sleep.  12/05/17  Yes [provider]  levofloxacin (LEVAQUIN) 750 MG tablet Take 1 tablet (750 mg total) by mouth daily. X 7 days 03/18/18   Janet Berlin    Family History  Family History  Problem Relation Age of Onset  . ALS Mother 34       non genetic  . Cancer Neg Hx     Social History Social History   Tobacco Use  . Smoking status: Never Smoker  . Smokeless tobacco: Never Used  Substance Use Topics  . Alcohol use: Yes    Comment: occasionally  . Drug use: Never     Allergies   Patient has no known allergies.   Review of Systems Review of Systems  Constitutional: Positive for chills and fever.  HENT: Positive for sore throat. Negative for congestion, postnasal drip, rhinorrhea, trouble swallowing and voice change.   Eyes:  Negative for visual disturbance.  Respiratory: Positive for cough and chest tightness. Negative for shortness of breath and wheezing.   Cardiovascular: Negative for chest pain, palpitations and leg swelling.  Gastrointestinal: Negative for abdominal pain, diarrhea, nausea and vomiting.  Genitourinary: Negative for dysuria and frequency.  Musculoskeletal: Positive for arthralgias (R hip pain) and neck pain. Negative for myalgias.  Skin: Negative for color change and rash.  Neurological: Negative for dizziness, syncope, weakness, light-headedness, numbness and headaches.     Physical Exam Updated Vital Signs BP (!) 104/56 (BP Location: Left Arm)   Pulse 84   Temp 98.4 F (36.9 C) (Oral)   Resp 18   SpO2 98%   Physical Exam  Constitutional: She appears well-developed and well-nourished. No distress.  HENT:  Head: Normocephalic and atraumatic.  Mouth/Throat: Oropharynx is clear and moist.  TMs clear with good landmarks, minimal nasal mucosa edema with clear rhinorrhea, posterior oropharynx clear and moist, with some erythema, no edema or exudates  Eyes: Pupils are equal, round, and reactive to light. EOM are normal. Right eye exhibits no discharge. Left eye exhibits no discharge.  Neck: Normal range of motion. Neck supple.  There is mild tenderness with palpable mass over the posterior neck  Cardiovascular: Normal rate, regular rhythm, normal heart sounds and intact distal pulses.  Pulmonary/Chest: Effort normal and breath sounds normal. No respiratory distress.  Respirations equal and unlabored, patient able to speak in full sentences, lungs clear to auscultation bilaterally  Abdominal: Soft. Bowel sounds are normal. She exhibits no distension and no mass. There is no tenderness. There is no guarding.  Abdomen soft, nondistended, nontender to palpation in all quadrants without guarding or peritoneal signs  Musculoskeletal: She exhibits no edema or deformity.  No lower extremity edema  or tenderness  Neurological: She is alert. Coordination normal.  Skin: Skin is warm and dry. Capillary refill takes less than 2 seconds. She is not diaphoretic.  Psychiatric: She has a normal mood and affect. Her behavior is normal.  Nursing note and vitals reviewed.    ED Treatments / Results  Labs (all labs ordered are listed, but only abnormal results are displayed) Labs Reviewed  BASIC METABOLIC PANEL - Abnormal; Notable for the following components:      Result Value   Chloride 96 (*)    Glucose, Bld 111 (*)    All other components within normal limits  CBC WITH DIFFERENTIAL/PLATELET - Abnormal; Notable for the following components:   WBC 13.2 (*)    Platelets 522 (*)    Neutro Abs 10.0 (*)    Monocytes Absolute 1.3 (*)    All other components within normal limits  URINALYSIS, ROUTINE W REFLEX MICROSCOPIC - Abnormal; Notable for the following components:   Color, Urine STRAW (*)    Specific Gravity, Urine 1.003 (*)  Hgb urine dipstick SMALL (*)    All other components within normal limits  CULTURE, BLOOD (ROUTINE X 2)  CULTURE, BLOOD (ROUTINE X 2)  I-STAT CG4 LACTIC ACID, ED  I-STAT CG4 LACTIC ACID, ED    EKG None  Radiology Dg Chest 2 View  Result Date: 03/18/2018 CLINICAL DATA:  62 y/o  F; productive cough and fever for 2-3 days. EXAM: CHEST - 2 VIEW COMPARISON:  03/05/2018 PET-CT. FINDINGS: Normal cardiac silhouette. Aortic atherosclerosis with calcification. Stable right upper lobe mass better characterized on prior PET-CT. No new consolidation. No pleural effusion or pneumothorax. No acute osseous abnormality is evident. IMPRESSION: 1. Stable right upper lobe mass. 2. No new acute pulmonary process identified. Electronically Signed   By: Kristine Garbe M.D.   On: 03/18/2018 14:56   Ct Angio Chest Pe W And/or Wo Contrast  Result Date: 03/18/2018 CLINICAL DATA:  Fever and productive cough.  Neck mass. EXAM: CT ANGIOGRAPHY CHEST WITH CONTRAST TECHNIQUE:  Multidetector CT imaging of the chest was performed using the standard protocol during bolus administration of intravenous contrast. Multiplanar CT image reconstructions and MIPs were obtained to evaluate the vascular anatomy. CONTRAST:  139mL ISOVUE-370 IOPAMIDOL (ISOVUE-370) INJECTION 76% COMPARISON:  PET-CT 03/05/2018. FINDINGS: Cardiovascular: The heart size is normal. No substantial pericardial effusion. No thoracic aortic aneurysm. Atherosclerotic calcification is noted in the wall of the thoracic aorta. No filling defect within the opacified pulmonary arteries to suggest the presence of an acute pulmonary embolus. Mediastinum/Nodes: 16 mm short axis precarinal lymph node evident. There is a 12 mm short axis right paratracheal lymph node. No left hilar lymphadenopathy. 13 mm short axis right hilar lymph node evident. The esophagus has normal imaging features. There is no axillary lymphadenopathy. Lungs/Pleura: The central tracheobronchial airways are patent. Posterior right upper lobe pulmonary mass measures 6.1 x 5.9 cm. This encases and attenuates airways to the posterior right upper lobe. Lesion likely invades through the major fissure into the anterior aspect of the right upper lobe. Subsegmental atelectasis with confluent airspace consolidation noted in the posterior right middle lobe. This right middle lobe finding is new in the interval. Compressive atelectasis noted in the lower lobes bilaterally. Tiny right pleural effusion evident. 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 Musculoskeletal: No worrisome lytic or sclerotic osseous abnormality. Review of the MIP images confirms the above findings. IMPRESSION: 1. No CT evidence for acute pulmonary embolus. 2. Large posterior right upper lobe pulmonary mass with likely extension through the major fissure. This is associated with mediastinal and right hilar  lymphadenopathy and necrotic left adrenal nodule. Disease characterized on recent PET-CT of 03/05/2018. 3. Posterior right middle lobe collapse/consolidation. This is new since 03/05/2018 and may be atelectasis and/or pneumonia. Electronically Signed   By: Misty Stanley M.D.   On: 03/18/2018 17:23    Procedures Procedures (including critical care time)  Medications Ordered in ED Medications  albuterol (PROVENTIL) (2.5 MG/3ML) 0.083% nebulizer solution 5 mg (5 mg Nebulization Given 03/18/18 1619)  iopamidol (ISOVUE-370) 76 % injection (  Contrast Given 03/18/18 1742)  iopamidol (ISOVUE-370) 76 % injection 100 mL (100 mLs Intravenous Contrast Given 03/18/18 1702)  sodium chloride 0.9 % bolus 500 mL (0 mLs Intravenous Stopped 03/18/18 1836)  levofloxacin (LEVAQUIN) tablet 750 mg (750 mg Oral Given 03/18/18 1803)     Initial Impression / Assessment and Plan / ED Course  I have reviewed the triage vital signs  and the nursing notes.  Pertinent labs & imaging results that were available during my care of the patient were reviewed by me and considered in my medical decision making (see chart for details).  Patient presents for evaluation of recent cancer diagnosis, has not started on chemotherapy or radiation yet.  Known mass in the right upper lung, posterior neck and right hip.  Story concerning for postobstructive pneumonia.  Afebrile here but recently took Tylenol, blood pressure 104/56 which is somewhat soft, but appears to be typical for the patient, no tachycardia, tachypnea or hypoxia.  On exam lungs are clear, abdomen is benign.  Will plan for labs including lactic acid and blood culture and chest x-ray.  Will try bronchodilator for chest tightness on the right.  Chest x-ray shows known lung mass with no evidence of pneumonia or other active cardiopulmonary disease.  Labs significant for leukocytosis of 13.2 with left shift, normal hemoglobin, no acute electrolyte derangements, urine without evidence  of infection, lactic acid 1.38, blood cultures pending.  Although chest x-ray and initial labs are reassuring, I am still concerned for potential postobstructive pneumonia developing, less likely but also considering pleurisy, or pulmonary embolus.  Will get CT Angie of the chest.  CTA shows no evidence of pulmonary embolus, there is a posterior right middle lobe consolidation, which is most likely pneumonia especially given clinical picture.  This is suggestive of postobstructive pneumonia, patient has remained afebrile with normal vitals and respiratory effort here in the ED, will give 500 mL fluid bolus and dose of p.o. Levaquin and ambulate patient, but feels she will likely be a candidate for outpatient antibiotic therapy with close follow-up.  Patient ambulated here in the emergency department maintaining O2 saturation greater than 96% reports she felt well while up walking around.  At this time I feel patient is stable for discharge home, prescription for Levaquin provided as patient has good renal function.  She will follow-up closely with her oncologist, strict return precautions discussed.  Patient and family expressed understanding and are in agreement with plan.  Final Clinical Impressions(s) / ED Diagnoses   Final diagnoses:  Pneumonia of right middle lobe due to infectious organism [facility])  Malignant neoplasm [facility])    ED Discharge Orders        Ordered    levofloxacin (LEVAQUIN) 750 MG tablet  Daily     03/18/18 1840       Janet Berlin 03/19/18 Evelina Bucy, MD 03/19/18 [phone]

## 2018-03-19 ENCOUNTER — Encounter: Payer: Self-pay | Admitting: Internal Medicine

## 2018-03-19 ENCOUNTER — Telehealth: Payer: Self-pay | Admitting: Medical Oncology

## 2018-03-19 ENCOUNTER — Encounter: Payer: Self-pay | Admitting: *Deleted

## 2018-03-19 MED FILL — levoFLOXacin 750 MG TABS: 750 | 7 days supply | Qty: 7 | Fill #0

## 2018-03-19 NOTE — Progress Notes (Signed)
Received PA request for Fentanyl patch.  Attempted to submit via Cover My Meds and received an error message. Called [employer](Lash[place]) to submit via phone. Answered questions the best I could. [id number]  Pending review. Turn around time is 24-72 hours will receive notification via fax.

## 2018-03-19 NOTE — Telephone Encounter (Signed)
Taking Levaquin for pneumonia. Wife and husband concerned that pt may be losing ground by waiting on pathology.

## 2018-03-19 NOTE — Telephone Encounter (Signed)
The patient will be receiving radiation therapy to the neck mass this week.  Pathology is still not clear about the primary malignancy or the type of malignancy and we are waiting for additional testing.  She was also referred for a second opinion at [facility].  I am not sure what kind of chemotherapy I would use if the cancer is unknown.  I am still waiting for more information to start her on the right treatment.

## 2018-03-19 NOTE — Progress Notes (Signed)
Oncology Nurse Navigator Documentation  Oncology Nurse Navigator Flowsheets 03/19/2018  Navigator Location [facility]  Navigator Encounter Type Other/I called managed care to follow up on referral to [place].  I left a message requesting an update.   Treatment Phase Pre-Tx/Tx Discussion  Barriers/Navigation Needs Coordination of Care  Interventions Coordination of Care  Coordination of Care Other  Acuity Level 1  Time Spent with Patient 15

## 2018-03-19 NOTE — Telephone Encounter (Signed)
Pt notified. Pathology slides sent to [facility].

## 2018-03-20 ENCOUNTER — Telehealth: Payer: Self-pay | Admitting: *Deleted

## 2018-03-20 ENCOUNTER — Telehealth: Payer: Self-pay | Admitting: Internal Medicine

## 2018-03-20 DIAGNOSIS — Z510 Encounter for antineoplastic radiation therapy: Secondary | ICD-10-CM | POA: Diagnosis not present

## 2018-03-20 DIAGNOSIS — C7951 Secondary malignant neoplasm of bone: Secondary | ICD-10-CM | POA: Diagnosis not present

## 2018-03-20 DIAGNOSIS — C7981 Secondary malignant neoplasm of breast: Secondary | ICD-10-CM | POA: Diagnosis not present

## 2018-03-20 DIAGNOSIS — C3411 Malignant neoplasm of upper lobe, right bronchus or lung: Secondary | ICD-10-CM | POA: Diagnosis not present

## 2018-03-20 NOTE — Telephone Encounter (Signed)
Called Dr. Vertell Limber office left vm in ref to appt.

## 2018-03-20 NOTE — Telephone Encounter (Signed)
Oncology Nurse Navigator Documentation  Oncology Nurse Navigator Flowsheets 03/20/2018  Navigator Location [facility]  Navigator Encounter Type Telephone/per Dr. Julien Nordmann, I called patient to schedule her to be seen tomorrow before her rad onc appt.  She verbalized understanding of appt  Treatment Phase Pre-Tx/Tx Discussion  Barriers/Navigation Needs Coordination of Care  Interventions Coordination of Care  Coordination of Care Appts  Acuity Level 2  Time Spent with Patient 15

## 2018-03-20 NOTE — Telephone Encounter (Signed)
Faxed records to [facility] at Dr. Vertell Limber office. Hilda Blades will call pt with appt.

## 2018-03-20 NOTE — Telephone Encounter (Signed)
Faxed note to Hilda Blades call me about pt appt.

## 2018-03-21 ENCOUNTER — Encounter: Payer: Self-pay | Admitting: Internal Medicine

## 2018-03-21 ENCOUNTER — Ambulatory Visit
Admission: RE | Admit: 2018-03-21 | Discharge: 2018-03-21 | Disposition: A | Discharge status: Home / Self Care | Payer: 59 | Source: Ambulatory Visit | Attending: Radiation Oncology | Admitting: Radiation Oncology

## 2018-03-21 ENCOUNTER — Inpatient Hospital Stay (HOSPITAL_BASED_OUTPATIENT_CLINIC_OR_DEPARTMENT_OTHER): Payer: 59 | Admitting: Internal Medicine

## 2018-03-21 ENCOUNTER — Telehealth: Payer: Self-pay | Admitting: Internal Medicine

## 2018-03-21 ENCOUNTER — Telehealth: Payer: Self-pay | Admitting: Medical Oncology

## 2018-03-21 VITALS — BP 98/52 | HR 71 | Temp 98.0°F | Resp 18 | Ht 67.0 in | Wt 129.1 lb

## 2018-03-21 VITALS — BP 113/49 | HR 64 | Temp 97.9°F | Resp 16

## 2018-03-21 DIAGNOSIS — M25551 Pain in right hip: Secondary | ICD-10-CM | POA: Diagnosis not present

## 2018-03-21 DIAGNOSIS — C7951 Secondary malignant neoplasm of bone: Secondary | ICD-10-CM | POA: Diagnosis not present

## 2018-03-21 DIAGNOSIS — G893 Neoplasm related pain (acute) (chronic): Secondary | ICD-10-CM

## 2018-03-21 DIAGNOSIS — Z5112 Encounter for antineoplastic immunotherapy: Secondary | ICD-10-CM | POA: Diagnosis not present

## 2018-03-21 DIAGNOSIS — C7989 Secondary malignant neoplasm of other specified sites: Secondary | ICD-10-CM | POA: Diagnosis not present

## 2018-03-21 DIAGNOSIS — C801 Malignant (primary) neoplasm, unspecified: Secondary | ICD-10-CM

## 2018-03-21 DIAGNOSIS — C7801 Secondary malignant neoplasm of right lung: Secondary | ICD-10-CM | POA: Diagnosis not present

## 2018-03-21 DIAGNOSIS — G4700 Insomnia, unspecified: Secondary | ICD-10-CM | POA: Diagnosis not present

## 2018-03-21 DIAGNOSIS — C3411 Malignant neoplasm of upper lobe, right bronchus or lung: Secondary | ICD-10-CM | POA: Diagnosis not present

## 2018-03-21 DIAGNOSIS — M542 Cervicalgia: Secondary | ICD-10-CM

## 2018-03-21 DIAGNOSIS — Z510 Encounter for antineoplastic radiation therapy: Secondary | ICD-10-CM | POA: Diagnosis not present

## 2018-03-21 DIAGNOSIS — Z5111 Encounter for antineoplastic chemotherapy: Secondary | ICD-10-CM | POA: Diagnosis not present

## 2018-03-21 DIAGNOSIS — Z79899 Other long term (current) drug therapy: Secondary | ICD-10-CM | POA: Diagnosis not present

## 2018-03-21 DIAGNOSIS — C7949 Secondary malignant neoplasm of other parts of nervous system: Secondary | ICD-10-CM | POA: Diagnosis not present

## 2018-03-21 DIAGNOSIS — C7981 Secondary malignant neoplasm of breast: Secondary | ICD-10-CM | POA: Diagnosis not present

## 2018-03-21 MED ORDER — ZOLPIDEM TARTRATE 10 MG PO TABS: 10.0000 mg | ORAL_TABLET | Freq: Every evening | ORAL | 0 refills | Status: DC | PRN

## 2018-03-21 MED FILL — ZOLPIDEM TARTRATE 10 MG TAB: 10 | 30 days supply | Qty: 30 | Fill #0

## 2018-03-21 NOTE — Telephone Encounter (Signed)
Pt appt with Dr. Aniceto Boss is 03/29/18. Pt is aware

## 2018-03-21 NOTE — Progress Notes (Signed)
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  PRIOR THERAPY: The patient is expected to start palliative radiotherapy to the neck mass and hip lesion under the care of Dr. Tammi Klippel soon.  CURRENT THERAPY: None  INTERVAL HISTORY: Rebecca Lucas 62 y.o. female returns to the clinic today for follow-up visit accompanied by her husband.  The patient continues to have pain in the neck and hip area.  She is currently on fentanyl patch 25 mcg/hour every 3 days.  She also use ibuprofen 400 mg p.o. twice daily as needed.  She does not use any more tramadol at this point.  She denied having any current chest pain, shortness of breath but has mild cough with no hemoptysis.  She was seen recently at the emergency department with fever and productive cough.  She was diagnosed with pneumonia and started on treatment with Levaquin for a total of 8 days.  She has no nausea, vomiting, diarrhea or constipation.  She denied having any recent weight loss or night sweats.  She has no headache or visual changes.  The final pathology after multiple immunohistochemical stains was consistent with metastatic poorly differentiated malignant neoplasm but otherwise nonspecific for carcinoma or sarcoma.  The patient was referred to Dr. Durenda Hurt at [place] [facility] and expected to see him next week.  She is also scheduled for palliative radiotherapy to the neck mass under the care of Dr. Tammi Klippel later today.  She is here today for evaluation and discussion of her treatment options.  MEDICAL HISTORY:No past medical history on file.  ALLERGIES:  has No Known Allergies.  MEDICATIONS:  Current Outpatient Medications    Medication Sig Dispense Refill  . acetaminophen (TYLENOL) 500 MG tablet Take 1,000 mg by mouth every 6 (six) hours as needed for mild pain.     Marland Kitchen alendronate (FOSAMAX) 70 MG tablet Take 70 mg by mouth once a week. Wednesday  3  . butalbital-aspirin-caffeine (FIORINAL) 50-325-40 MG capsule Take 1 capsule by mouth every 6 (six) hours as needed for migraine.   1  . Calcium Carbonate-Vitamin D (CALCIUM-D PO) Take 1 tablet by mouth 2 (two) times daily.    . fentaNYL (DURAGESIC - DOSED MCG/HR) 25 MCG/HR patch Place 1 patch (25 mcg total) onto the skin every 3 (three) days. 10 patch 0  . ibuprofen (ADVIL,MOTRIN) 200 MG tablet Take 600 mg by mouth every 6 (six) hours as needed for mild pain.     Marland Kitchen levofloxacin (LEVAQUIN) 750 MG tablet Take 1 tablet (750 mg total) by mouth daily. X 7 days 7 tablet 0  . LUTEIN PO Take 1 tablet by mouth daily.    . traMADol (ULTRAM) 50 MG tablet Take 50 mg by mouth 4 (four) times daily as needed (pain).   0  . zolpidem (AMBIEN) 10 MG tablet Take 1 tablet (10 mg total) by mouth at bedtime as needed for sleep. 30 tablet 0   No current facility-administered medications for this visit.     SURGICAL HISTORY: No past surgical history on file.  REVIEW OF SYSTEMS:  Constitutional: positive for fatigue Eyes: negative Ears, nose, mouth, throat, and face: negative Respiratory: positive for cough Cardiovascular: negative Gastrointestinal: negative Genitourinary:negative Integument/breast: negative  Hematologic/lymphatic: negative Musculoskeletal:positive for bone pain and neck pain Neurological: negative Behavioral/Psych: negative Endocrine: negative Allergic/Immunologic: negative   PHYSICAL EXAMINATION: General appearance: alert, cooperative, fatigued and no distress Head: Normocephalic, without obvious abnormality, atraumatic Neck: no adenopathy, no JVD, supple, symmetrical, trachea midline and thyroid not enlarged, symmetric, no tenderness/mass/nodules Lymph nodes:  Cervical, supraclavicular, and axillary nodes normal. Resp: clear to auscultation bilaterally Back: symmetric, no curvature. ROM normal. No CVA tenderness. Cardio: regular rate and rhythm, S1, S2 normal, no murmur, click, rub or gallop GI: soft, non-tender; bowel sounds normal; no masses,  no organomegaly Extremities: extremities normal, atraumatic, no cyanosis or edema Neurologic: Alert and oriented X 3, normal strength and tone. Normal symmetric reflexes. Normal coordination and gait  ECOG PERFORMANCE STATUS: 1 - Symptomatic but completely ambulatory  Blood pressure (!) 98/52, pulse 71, temperature 98 F (36.7 C), temperature source Oral, resp. rate 18, height 5\' 7"  (1.702 m), weight 129 lb 1.6 oz (58.6 kg), SpO2 100 %.  LABORATORY DATA: Lab Results  Component Value Date   WBC 13.2 (H) 03/18/2018   HGB 12.1 03/18/2018   HCT 37.7 03/18/2018   MCV 87.7 03/18/2018   PLT 522 (H) 03/18/2018      Chemistry      Component Value Date/Time   NA 137 03/18/2018 1558   K 4.3 03/18/2018 1558   CL 96 (L) 03/18/2018 1558   CO2 28 03/18/2018 1558   BUN 8 03/18/2018 1558   CREATININE 0.66 03/18/2018 1558   CREATININE 0.75 03/06/2018 1128      Component Value Date/Time   CALCIUM 9.2 03/18/2018 1558   ALKPHOS 93 03/06/2018 1128   AST 33 03/06/2018 1128   ALT 49 (H) 03/06/2018 1128   BILITOT 0.2 (L) 03/06/2018 1128       RADIOGRAPHIC STUDIES: Dg Chest 2 View  Result Date: 03/18/2018 CLINICAL DATA:  62 y/o  F; productive cough and fever for 2-3 days. EXAM: CHEST - 2 VIEW COMPARISON:  03/05/2018 PET-CT. FINDINGS: Normal cardiac silhouette. Aortic atherosclerosis with calcification. Stable right upper lobe mass better characterized on prior PET-CT. No new consolidation. No pleural effusion or pneumothorax. No acute osseous abnormality is evident. IMPRESSION: 1. Stable right upper lobe mass. 2. No new acute pulmonary process identified. Electronically Signed   By: Kristine Garbe  M.D.   On: 03/18/2018 14:56   Dg Cervical Spine Complete  Result Date: 02/22/2018 CLINICAL DATA:  New onset neck pain for 5 weeks. EXAM: CERVICAL SPINE - COMPLETE 4+ VIEW COMPARISON:  None. FINDINGS: There is no evidence of cervical spine fracture or prevertebral soft tissue swelling. Alignment is normal. Degenerative disc disease with disc height loss at C5-6. Expansile ill-defined bone lesion in the posterior aspect of the C2 spinous process with bone destruction which appears also involve C3 spinous process. IMPRESSION: Expansile ill-defined bone lesion in the posterior aspect of the C2 spinous process with bone destruction which appears also involve C3 spinous process. The appearance is most concerning for malignancy until proven otherwise. Recommend further evaluation with a CT or MRI of the cervical spine. No acute osseous injury of the cervical spine. These results will be called to the ordering clinician or representative by the Radiologist Assistant, and communication documented in the PACS or zVision Dashboard. Electronically Signed   By: Kathreen Devoid   On: 02/22/2018 14:18   Ct Angio Chest Pe W And/or Wo Contrast  Result Date: 03/18/2018 CLINICAL DATA:  Fever and productive cough.  Neck mass. EXAM: CT ANGIOGRAPHY CHEST WITH CONTRAST  TECHNIQUE: Multidetector CT imaging of the chest was performed using the standard protocol during bolus administration of intravenous contrast. Multiplanar CT image reconstructions and MIPs were obtained to evaluate the vascular anatomy. CONTRAST:  160mL ISOVUE-370 IOPAMIDOL (ISOVUE-370) INJECTION 76% COMPARISON:  PET-CT 03/05/2018. FINDINGS: Cardiovascular: The heart size is normal. No substantial pericardial effusion. No thoracic aortic aneurysm. Atherosclerotic calcification is noted in the wall of the thoracic aorta. No filling defect within the opacified pulmonary arteries to suggest the presence of an acute pulmonary embolus. Mediastinum/Nodes: 16 mm short axis  precarinal lymph node evident. There is a 12 mm short axis right paratracheal lymph node. No left hilar lymphadenopathy. 13 mm short axis right hilar lymph node evident. The esophagus has normal imaging features. There is no axillary lymphadenopathy. Lungs/Pleura: The central tracheobronchial airways are patent. Posterior right upper lobe pulmonary mass measures 6.1 x 5.9 cm. This encases and attenuates airways to the posterior right upper lobe. Lesion likely invades through the major fissure into the anterior aspect of the right upper lobe. Subsegmental atelectasis with confluent airspace consolidation noted in the posterior right middle lobe. This right middle lobe finding is new in the interval. Compressive atelectasis noted in the lower lobes bilaterally. Tiny right pleural effusion evident. 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 Musculoskeletal: No worrisome lytic or sclerotic osseous abnormality. Review of the MIP images confirms the above findings. IMPRESSION: 1. No CT evidence for acute pulmonary embolus. 2. Large posterior right upper lobe pulmonary mass with likely extension through the major fissure. This is associated with mediastinal and right hilar lymphadenopathy and necrotic left adrenal nodule. Disease characterized on recent PET-CT of 03/05/2018. 3. Posterior right middle lobe collapse/consolidation. This is new since 03/05/2018 and may be atelectasis and/or pneumonia. Electronically Signed   By: Misty Stanley M.D.   On: 03/18/2018 17:23   Mr Jeri Cos ZJ Contrast  Result Date: 03/09/2018 CLINICAL DATA:  Diffuse metastatic disease of unknown primary, possibly small-cell lung cancer. Staging. EXAM: MRI HEAD WITHOUT AND WITH CONTRAST TECHNIQUE: Multiplanar, multiecho pulse sequences of the brain and surrounding structures were obtained without and with intravenous contrast. CONTRAST:  48mL  MULTIHANCE GADOBENATE DIMEGLUMINE 529 MG/ML IV SOLN COMPARISON:  Cervical spine MRI 02/24/2018. PET-CT 03/05/2018. No prior brain imaging. FINDINGS: Brain: There is no evidence of acute infarct, intracranial hemorrhage, mass, midline shift, or extra-axial fluid collection. Small foci of T2 hyperintensity are scattered throughout the predominantly subcortical cerebral white matter bilaterally, mild-to-moderate for age. No abnormal brain parenchymal or meningeal enhancement suggestive of metastatic disease is identified. A small developmental venous anomaly is incidentally noted in the left parietal lobe. Vascular: Major intracranial vascular flow voids are preserved. Skull and upper cervical spine: Destructive mass involving the C2 posterior elements with dorsal epidural extension as described on the prior cervical spine MRI. Abnormal marrow signal in the anteroinferior C2 vertebral body as previously seen also concerning for metastasis. Sinuses/Orbits: Unremarkable orbits. Paranasal sinuses and mastoid air cells are clear. Other: 1.5 cm enhancing subcutaneous nodule in the right suboccipital region, hypermetabolic on PET-CT. IMPRESSION: 1. No evidence of intracranial metastatic disease. 2. 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 3. 1.5 cm right suboccipital subcutaneous nodule suspicious for metastasis. 4. Mild-to-moderate cerebral white matter T2 signal changes, nonspecific though most often seen with chronic small vessel ischemia. Electronically Signed   By: Logan Bores M.D.   On: 03/09/2018 09:45   Mr Cervical Spine Wo  Contrast  Result Date: 02/25/2018 CLINICAL DATA:  Neck pain for 6 weeks.  Follow-up C2 lesion. EXAM: MRI CERVICAL SPINE WITHOUT CONTRAST TECHNIQUE: Multiplanar, multisequence MR imaging of the cervical spine was performed. No intravenous contrast was administered. COMPARISON:  Cervical spine radiographs February 22, 2018 FINDINGS: ALIGNMENT: Maintained cervical lordosis.  No  malalignment. VERTEBRAE/DISCS: Expansile 3.6 x 5 x 5.8 cm (AP by transverse by cc) soft tissue mass arising from the posterior elements of C2, symmetrically invading the paraspinal soft tissues and dorsal epidural space. Low T1 and bright STIR signal ventral C2. Vertebral bodies otherwise intact. Moderate to severe C4-5 and C5 disc height loss with mild chronic discogenic endplate changes. CORD:Cervical spinal cord is normal morphology and signal characteristics from the cervicomedullary junction to level of T2-3, the most caudal well visualized level. POSTERIOR FOSSA, VERTEBRAL ARTERIES, PARASPINAL TISSUES: No MR findings of ligamentous injury. Vertebral artery flow voids present. Included posterior fossa and paraspinal soft tissues are normal. DISC LEVELS: C2-3: Tumor within the dorsal epidural space effaces the posterior thecal sac, AP dimension of the canal is 10 mm. No neural foraminal narrowing. Mild facet arthropathy. C3-4: Small LEFT central disc protrusion, uncovertebral hypertrophy and mild facet arthropathy. No canal stenosis or neural foraminal narrowing. C4-5: Small broad-based disc bulge, uncovertebral hypertrophy and mild facet arthropathy. No canal stenosis. Minimal RIGHT neural foraminal narrowing. C5-6: Small broad-based disc bulge, uncovertebral hypertrophy and mild-to-moderate facet arthropathy. No canal stenosis. Mild bilateral neural foraminal narrowing. C6-7: Small broad-based disc bulge and central disc protrusion without canal stenosis or neural foraminal narrowing. C7-T1: No disc bulge, canal stenosis nor neural foraminal narrowing. IMPRESSION: 1. 3.6 x 5 x 5.8 cm soft tissue mass arising from posterior elements of C2 with paraspinal muscle and dorsal epidural invasion. Probable tumor within ventral C2. Differential diagnosis includes plasmacytoma and metastatic disease. Recommend PET-CT and/or sampling. 2. Degenerative cervical spine. No canal stenosis. Minimal to mild C4-5 and C5-6 neural  foraminal narrowing. 3. These results will be called to the ordering clinician or representative by the Radiologist Assistant, and communication documented in the PACS or zVision Dashboard. Electronically Signed   By: Elon Alas M.D.   On: 02/25/2018 02:47   Nm Pet Image Initial (pi) Skull Base To Thigh  Result Date: 03/05/2018 CLINICAL DATA:  Initial treatment strategy for paraspinal mass along the upper cervical spine EXAM: NUCLEAR MEDICINE PET SKULL BASE TO THIGH TECHNIQUE: 6.4 mCi F-18 FDG was injected intravenously. Full-ring PET imaging was performed from the skull base to thigh after the radiotracer. CT data was obtained and used for attenuation correction and anatomic localization. Fasting blood glucose: 101 mg/dl COMPARISON:  MRI 02/24/2018 FINDINGS: Mediastinal blood pool activity: SUV max 2.0 NECK: Intensely hypermetabolic paraspinal mass posterior to the C2, C3, and C4 vertebral bodies. Mass corresponds to mass lesion identified on comparison MRI. Mass extremely hypermetabolic active with SUV max equal 40. There is involvement of the spinous process at C2 and C3. Mild probable epidural extension of the tumor. There is a cutaneous lesion in the scalp posterior to the RIGHT occipital bone with SUV max equal 21.2 (image 10 of the fused data set. Incidental CT findings: none CHEST: Large intensely hypermetabolic mass in the RIGHT upper lobe measures 5.7 cm. (SUV max equal 28.4). There several adjacent satellite nodules upra inferior to the mass which are also intensely hypermetabolic. Hypermetabolic precarinal lymph nodes and RIGHT para lower tracheal lymph nodes with SUV max equal 7.6. The more distant nodule in the RIGHT lower lobe just above  the RIGHT hemidiaphragm is also hypermetabolic Incidental CT findings: none ABDOMEN/PELVIS: Hypermetabolic enlargement of the LEFT adrenal gland to 1.8 cm with SUV max equal 6.5. Hypermetabolic lesion along the posterior aspect the RIGHT hepatic lobe is  within the intercostal musculature (SUV max equal 19.1, image 111) Dorsal to the LEFT psoas muscle, at the superior aspect of the SI joint, is a soft tissue mass measuring 1.7 cm with SUV max equal 34.8. Incidental CT findings: Uterus and ovaries are normal. Kidneys normal. Pancreas normal. SKELETON: Skeletal metastasis within the RIGHT sacral ala with SUV max equal 32. subtle ground-glass lesion measuring 20 mm is present on image 151/4. Additional metastatic lesion in the posterior aspect of the LEFT iliac bone with SUV max equal 26. Additional metastatic lesion in the LEFT gluteus muscle which difficult to see on the CT portion exam present tense with SUV max equal 24. Incidental CT findings: none IMPRESSION: 1. Multiorgan intensely hypermetabolic metastatic disease. Favor primary small cell lung cancer versus less favored melanoma. 2. Large RIGHT upper lobe mass measures 5.7 cm and is favored primary lesion. 3. Intensely hypermetabolic posterior paraspinal mass involving the musculature and spinous processes of the upper cervical spine. 4. Small cutaneous lesion posterior to the RIGHT occipital bone is favor metastatic deposit. 5. Additional metastatic lesions in the LEFT adrenal gland and intracostal muscles of the posterior RIGHT lower ribs. Soft tissue metastasis dorsal to the LEFT psoas muscle. 6. Skeletal metastasis evident within the RIGHT sacral ala and LEFT ischium. If multidisciplinary follow up management is desired, this is available in the [facility] through the [facility] [phone]. Electronically Signed   By: Suzy Bouchard M.D.   On: 03/05/2018 14:37   Ct Biopsy  Result Date: 03/08/2018 INDICATION: Concern for metastatic lung cancer. Please perform image guided biopsy hypermetabolic mass within the paraspinal musculature posterior to the superior/mid aspect of the cervical spine for tissue diagnostic purposes. EXAM: CT AND ULTRASOUND-GUIDED BIOPSY  HYPERMETABOLIC MASS WITHIN THE PARASPINAL MUSCULATURE OF THE CERVICAL SPINE COMPARISON:  PET-CT - 03/05/2018; cervical spine MRI - 02/14/2018 MEDICATIONS: None. ANESTHESIA/SEDATION: Fentanyl 100 mcg IV; Versed 2 mg IV Sedation time: 19 minutes; The patient was continuously monitored during the procedure by the interventional radiology nurse under my direct supervision. CONTRAST:  None. COMPLICATIONS: None immediate. PROCEDURE: Informed consent was obtained from the patient following an explanation of the procedure, risks, benefits and alternatives. A time out was performed prior to the initiation of the procedure. The patient was positioned prone on the CT table and a limited CT was performed for procedural planning demonstrating unchanged appearance of the partially calcified approximately 5.8 x 3.4 cm mass centered within the paraspinal musculatures posterior to the superior/mid cervical spine (image 22, series 2). Table position was marked and the mass was identified sonographically. The procedure was planned. The operative site was prepped and draped in the usual sterile fashion. Under direct ultrasound guidance, appropriate trajectory was confirmed with a 22 gauge spinal needle after the adjacent tissues were anesthetized with 1% Lidocaine with epinephrine. Appropriate positioning was confirmed with a limited CT scan. Next, under direct ultrasound guidance, a 17 gauge coaxial needle was advanced into the peripheral aspect of the mass. Appropriate positioning was confirmed and 6 core needle biopsy samples were obtained with an 18 gauge core needle biopsy device under direct ultrasound guidance. Multiple ultrasound images were saved for procedural documentation purposes. The co-axial needle was removed and hemostasis was achieved with manual compression. A limited postprocedural CT was negative  for hemorrhage or additional complication. A dressing was placed. The patient tolerated the procedure well without  immediate postprocedural complication. IMPRESSION: Technically successful ultrasound and CT guided core needle biopsy of indeterminate hypermetabolic mass within the paraspinal musculatures about the cranial/mid aspect of the posterior cervical spine. Electronically Signed   By: Sandi Mariscal M.D.   On: 03/08/2018 13:16    ASSESSMENT AND PLAN: This is a very pleasant 62 years old white female with metastatic poorly differentiated malignant neoplasm of unknown primary at this point. The immunohistochemical stains that were performed on the pathology specimen were not characteristic of any type of malignancy or differentiate between carcinoma and sarcoma. I requested the tissue block to be sent to cancer type DX for further evaluation of her pathology.  I also requested a specimen to be sent to [facility] for molecular studies and PDL 1 expression. I also had a lengthy discussion with the patient and her husband about her current condition and proceeding with another biopsy from the lung mass to have more tissue for additional molecular studies if needed and also for further characterization of her tumor. The patient is agreeable to proceed with the CT-guided core biopsy of the lung mass and I will refer her to interventional radiology for this procedure.  I discussed the procedure with Dr. Pascal Lux from interventional radiology.  He will do his best to get this biopsy scheduled as soon as possible. The patient also has a second opinion with Dr. Durenda Hurt at [facility] for evaluation of her condition and also probably review of the pathology slides with his team of pathologist at [facility]. For the neck pain as well as the right hip pain, the patient will proceed with palliative radiotherapy under the care of Dr. Tammi Klippel.  We will hold palliative radiotherapy to the right upper lobe lung mass for now until the patient had the second biopsy performed. I will arrange for the patient to  have a follow-up appointment with me after the biopsy results becomes available or if the cancer type DX provided me with more information regarding her malignancy. The patient and her husband understand that I would not be able to start chemotherapy until I identify the right type of her malignancy.  Hopefully the wait time not be long. For pain management she will continue on the fentanyl patch and ibuprofen as needed. For insomnia, I gave her refill of Ambien. The patient was advised to call immediately if she has any concerning symptoms in the interval. The patient voices understanding of current disease status and treatment options and is in agreement with the current care plan.  All questions were answered. The patient knows to call the clinic with any problems, questions or concerns. We can certainly see the patient much sooner if necessary.  I spent 20 minutes counseling the patient face to face. The total time spent in the appointment was 30 minutes.  Disclaimer: This note was dictated with voice recognition software. Similar sounding words can inadvertently be transcribed and may not be corrected upon review.

## 2018-03-21 NOTE — Progress Notes (Signed)
Pt returned phone call to this RN. Advised pt of biopsy appt tomorrow. Arrive at 11:30am for 1pm procedure. Pt verbalizes understanding of all instructions given. Appreciative of call.

## 2018-03-21 NOTE — Op Note (Signed)
   Name: Rebecca Lucas  MRN: [id number]  Date: 03/21/2018   DOB: 1956/03/15  Stereotactic Radiosurgery Operative Note  PRE-OPERATIVE DIAGNOSIS:  Spinal Metastasis  POST-OPERATIVE DIAGNOSIS:  Spinal Metastasis  PROCEDURE:  Stereotactic Radiosurgery  SURGEON:  Peggyann Shoals, MD  NARRATIVE: The patient underwent a radiation treatment planning session in the radiation oncology simulation suite under the care of the radiation oncology physician and physicist.  I participated closely in the radiation treatment planning afterwards. The patient underwent planning CT myelogram which was fused to the MRI.  These images were fused on the planning system.  Radiation oncology contoured the gross target volume and subsequently expanded this to yield the Planning Target Volume. I actively participated in the planning process.  I helped to define and review the target contours and also the contours of the spinal cord, and selected nearby organs at risk.  All the dose constraints for critical structures were reviewed and compared to AAPM Task Group 101.  The prescription dose conformity was reviewed.  I approved the plan electronically.    Accordingly, Rebecca Lucas was brought to the TrueBeam stereotactic radiation treatment linac and placed in the custom immobilization device.  The patient was aligned according to the IR fiducial markers with [employer] Exactrac, then orthogonal x-rays were used in ExacTrac with the 6DOF robotic table and the shifts were made to align the patient.  Then conebeam CT was performed to verify precision.  Rebecca Lucas received stereotactic radiosurgery uneventfully.  The detailed description of the procedure is recorded in the radiation oncology procedure note.  I was present for the duration of the procedure.  DISPOSITION:  Following delivery, the patient was transported to nursing in stable condition and monitored for possible acute effects to be discharged to home in stable  condition with follow-up in one month.  Peggyann Shoals, MD 03/21/2018 4:34 PM

## 2018-03-21 NOTE — Progress Notes (Signed)
Rebecca Lucas rested with Rebecca Lucas for 15 minutes following her first radiation treatment.  Patient denies headache, dizziness, nausea, diplopia or ringing in the ears. Denies fatigue. Patient without complaints. Understands to avoid strenuous activity for the next 24 hours and call [phone] with needs.   Gloriajean Dell. Leonie Green, BSN

## 2018-03-21 NOTE — Telephone Encounter (Signed)
Pt appt is July 18th with Dr Durenda Hurt , lab at 1325 and office visit 1400.

## 2018-03-22 ENCOUNTER — Telehealth: Payer: Self-pay | Admitting: Medical Oncology

## 2018-03-22 ENCOUNTER — Other Ambulatory Visit: Payer: Self-pay | Admitting: Radiology

## 2018-03-22 ENCOUNTER — Encounter: Payer: Self-pay | Admitting: Internal Medicine

## 2018-03-22 ENCOUNTER — Ambulatory Visit (HOSPITAL_COMMUNITY): Admission: RE | Admit: 2018-03-22 | Payer: 59 | Source: Ambulatory Visit

## 2018-03-22 ENCOUNTER — Ambulatory Visit (HOSPITAL_COMMUNITY)
Admission: RE | Admit: 2018-03-22 | Discharge: 2018-03-22 | Disposition: A | Discharge status: Home / Self Care | Payer: 59 | Source: Ambulatory Visit | Attending: Interventional Radiology | Admitting: Interventional Radiology

## 2018-03-22 ENCOUNTER — Ambulatory Visit (HOSPITAL_COMMUNITY)
Admission: RE | Admit: 2018-03-22 | Discharge: 2018-03-22 | Disposition: A | Discharge status: Home / Self Care | Payer: 59 | Source: Ambulatory Visit | Attending: Internal Medicine | Admitting: Internal Medicine

## 2018-03-22 DIAGNOSIS — Z9889 Other specified postprocedural states: Secondary | ICD-10-CM

## 2018-03-22 DIAGNOSIS — R918 Other nonspecific abnormal finding of lung field: Secondary | ICD-10-CM | POA: Diagnosis not present

## 2018-03-22 DIAGNOSIS — C7951 Secondary malignant neoplasm of bone: Secondary | ICD-10-CM | POA: Diagnosis not present

## 2018-03-22 DIAGNOSIS — Z79899 Other long term (current) drug therapy: Secondary | ICD-10-CM | POA: Diagnosis not present

## 2018-03-22 DIAGNOSIS — J189 Pneumonia, unspecified organism: Secondary | ICD-10-CM | POA: Insufficient documentation

## 2018-03-22 DIAGNOSIS — C3411 Malignant neoplasm of upper lobe, right bronchus or lung: Secondary | ICD-10-CM | POA: Diagnosis not present

## 2018-03-22 DIAGNOSIS — R911 Solitary pulmonary nodule: Secondary | ICD-10-CM | POA: Diagnosis not present

## 2018-03-22 LAB — CBC
HCT: 34.6 % — ABNORMAL LOW (ref 36.0–46.0)
HEMOGLOBIN: 10.8 g/dL — AB (ref 12.0–15.0)
MCH: 27.6 pg (ref 26.0–34.0)
MCHC: 31.2 g/dL (ref 30.0–36.0)
MCV: 88.5 fL (ref 78.0–100.0)
PLATELETS: 537 10*3/uL — AB (ref 150–400)
RBC: 3.91 MIL/uL (ref 3.87–5.11)
RDW: 13.5 % (ref 11.5–15.5)
WBC: 14.5 10*3/uL — AB (ref 4.0–10.5)

## 2018-03-22 LAB — PROTIME-INR
INR: 1.14
PROTHROMBIN TIME: 14.5 s (ref 11.4–15.2)

## 2018-03-22 LAB — APTT: aPTT: 35 seconds (ref 24–36)

## 2018-03-22 MED ORDER — MIDAZOLAM HCL 2 MG/2ML IJ SOLN
INTRAMUSCULAR | Status: AC
  Filled 2018-03-22: qty 4

## 2018-03-22 MED ORDER — FENTANYL CITRATE (PF) 100 MCG/2ML IJ SOLN
INTRAMUSCULAR | Status: AC
  Filled 2018-03-22: qty 4

## 2018-03-22 MED ORDER — MIDAZOLAM HCL 2 MG/2ML IJ SOLN
INTRAMUSCULAR | Status: AC | PRN
  Administered 2018-03-22: 1 mg via INTRAVENOUS

## 2018-03-22 MED ORDER — LIDOCAINE HCL 1 % IJ SOLN
INTRAMUSCULAR | Status: AC
  Filled 2018-03-22: qty 20

## 2018-03-22 MED ORDER — SODIUM CHLORIDE 0.9 % IV SOLN: INTRAVENOUS | Status: DC

## 2018-03-22 MED ORDER — FENTANYL CITRATE (PF) 100 MCG/2ML IJ SOLN
INTRAMUSCULAR | Status: AC | PRN
  Administered 2018-03-22: 50 ug via INTRAVENOUS

## 2018-03-22 NOTE — Progress Notes (Signed)
Received PA determination from [facility] for Fentanyl 25MCG.  PA denied. Denial given to [facility].

## 2018-03-22 NOTE — Telephone Encounter (Signed)
Fentanyl Prior auth initiated.

## 2018-03-22 NOTE — Discharge Instructions (Signed)
Needle Biopsy of the Lung, Care After °This sheet gives you information about how to care for yourself after your procedure. Your health care provider may also give you more specific instructions. If you have problems or questions, contact your health care provider. °What can I expect after the procedure? °After the procedure, it is common to have: °· Soreness, pain, and tenderness where a tissue sample was taken (biopsy site). °· A cough. °· A sore throat. ° °Follow these instructions at home: °Biopsy site care °· Follow instructions from your health care provider about when to remove the bandage that was placed on the biopsy site. °· Keep the bandage dry until it has been removed. °· Check your biopsy site every day for signs of infection. Check for: °? More redness, swelling, or pain. °? More fluid or blood. °? Warmth to the touch. °? Pus or a bad smell. °General instructions °· Rest as directed by your health care provider. Ask your health care provider what activities are safe for you. °· Do not take baths, swim, or use a hot tub until your health care provider approves. °· Take over-the-counter and prescription medicines only as told by your health care provider. °· If you have airplane travel scheduled, talk with your health care provider about when it is safe for you to travel by airplane. °· It is up to you to get the results of your procedure. Ask your health care provider, or the department that is doing the procedure, when your results will be ready. °· Keep all follow-up visits as told by your health care provider. This is important. °Contact a health care provider if: °· You have more redness, swelling, or pain around your biopsy site. °· You have more fluid or blood coming from your biopsy site. °· Your biopsy site feels warm to the touch. °· You have pus or a bad smell coming from your biopsy site. °· You have a fever. °· You have pain that does not get better with medicine. °Get help right away  if: °· You have problems breathing. °· You have chest pain. °· You cough up blood. °· You faint. °· You have a fast heart rate. °Summary °· After a needle biopsy of the lung, it is common to have a cough, a sore throat, or soreness, pain, and tenderness where a tissue sample was taken (biopsy site). °· You should check your biopsy area every day for signs of infection, including pus or a bad smell, warmth, more fluid or blood, or more redness, swelling, or pain. °· You should not take baths, swim, or use a hot tub until your health care provider approves. °· It is up to you to get the results of your procedure. Ask your health care provider, or the department that is doing the procedure, when your results will be ready. °This information is not intended to replace advice given to you by your health care provider. Make sure you discuss any questions you have with your health care provider. °Document Released: 06/26/2007 Document Revised: 07/20/2016 Document Reviewed: 07/20/2016 °[employer]vier Interactive Patient Education © 2017 [employer] ° ° °

## 2018-03-22 NOTE — Procedures (Signed)
Pre procedural Dx: Right upper lobe pulmonary mass  Post procedural Dx: Same  Technically successful CT guided biopsy of right upper lobe pulmonary mass.   EBL: None.   Complications: None immediate.   Ronny Bacon, MD Pager #: [phone]

## 2018-03-22 NOTE — H&P (Signed)
Chief Complaint: Patient was seen in consultation today for lung mass  Referring Physician(s): Dr Mayme Genta  Supervising Physician: Sandi Mariscal  Patient Status: [facility] - Out-pt  History of Present Illness: Rebecca Lucas is a 62 y.o. female known to radiology service from recent neck mass biopsy on 03/08/18.  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  Patient has initiated treatment, however repeat biopsy of new site has been requested for further testing and delineation of cancer type.  Case reviewed by Dr. Pascal Lux who approves patient for lung mass biopsy.   Patient presents today in her usual state of health. She did initiate treatment for pneumonia 5 days ago and has been on antibiotics.  She denies fever, chills, shortness of breath, cough, abdominal pain, dysuria.   She has ben NPO.  She does not take blood thinners.   No past medical history on file.  No past surgical history on file.  Allergies: Patient has no known allergies.  Medications: Prior to Admission medications   Medication Sig Start Date End Date Taking? Authorizing Provider  acetaminophen (TYLENOL) 500 MG tablet Take 1,000 mg by mouth every 6 (six) hours as needed.   Yes [provider]  alendronate (FOSAMAX) 70 MG tablet Take 70 mg by mouth once a week. Wednesday 01/12/18  Yes [provider]  Calcium Carbonate-Vitamin D (CALCIUM-D PO) Take 1 tablet by mouth 2 (two) times daily.   Yes [provider]  ibuprofen (ADVIL,MOTRIN) 200 MG tablet Take 600 mg by mouth every 6 (six) hours as needed.   Yes [provider]  traMADol (ULTRAM) 50 MG tablet Take 50 mg by mouth 4 (four) times daily as needed. 03/05/18  Yes [provider]  zolpidem (AMBIEN) 10 MG tablet Take 10 mg by mouth at bedtime as needed. 12/05/17  Yes [provider]  butalbital-aspirin-caffeine Acquanetta Chain) 50-325-40 MG capsule Take 1 capsule by mouth every 6 (six) hours as  needed. 12/05/17   [provider]     Family History  Problem Relation Age of Onset  . ALS Mother 41       non genetic  . Cancer Neg Hx     Social History   Socioeconomic History  . Marital status: Married    Spouse name: Not on file  . Number of children: Not on file  . Years of education: Not on file  . Highest education level: Not on file  Occupational History  . Not on file  Social Needs  . Financial resource strain: Not on file  . Food insecurity:    Worry: Not on file    Inability: Not on file  . Transportation needs:    Medical: Not on file    Non-medical: Not on file  Tobacco Use  . Smoking status: Never Smoker  . Smokeless tobacco: Never Used  Substance and Sexual Activity  . Alcohol use: Yes    Comment: occasionally  . Drug use: Never  . Sexual activity: Yes  Lifestyle  . Physical activity:    Days per week: Not on file    Minutes per session: Not on file  . Stress: Not on file  Relationships  . Social connections:    Talks on phone: Not on file    Gets together: Not on file    Attends religious service: Not on file    Active member of club or organization: Not on file    Attends meetings of clubs or organizations: Not on  file    Relationship status: Not on file  Other Topics Concern  . Not on file  Social History Narrative   Married with two children. Her husband is Dr. [name], a physician with occupational health. She has worked several jobs Energy manager, Glass blower/designer, and now the [employer].     Review of Systems: A 12 point ROS discussed and pertinent positives are indicated in the HPI above.  All other systems are negative.  Review of Systems  Constitutional: Positive for activity change. Negative for fatigue and fever.  Respiratory: Negative for cough and shortness of breath.   Cardiovascular: Negative for chest pain.  Gastrointestinal: Negative for abdominal pain.  Musculoskeletal: Positive for neck  pain and neck stiffness.  Neurological: Negative for weakness.  Psychiatric/Behavioral: Negative for behavioral problems and confusion.    Vital Signs: BP (!) 96/42 (BP Location: Right Arm)   Pulse 78   Temp 98 F (36.7 C) (Oral)   Ht 5\' 7"  (1.702 m)   Wt 130 lb (59 kg)   SpO2 100%   BMI 20.36 kg/m   Physical Exam  Constitutional: She is oriented to person, place, and time.  Neck: No tracheal deviation present.  Decreased ROM, neck stiffness.  Palpable mass.   Cardiovascular: Normal rate, regular rhythm and normal heart sounds.  Pulmonary/Chest: Effort normal and breath sounds normal. No respiratory distress. She has no wheezes.  Abdominal: Soft. Bowel sounds are normal.  Musculoskeletal: Normal range of motion.  Neurological: She is alert and oriented to person, place, and time.  Skin: Skin is warm and dry.  Psychiatric: She has a normal mood and affect. Her behavior is normal. Judgment and thought content normal.  Nursing note and vitals reviewed.   Imaging: Dg Chest 2 View  Result Date: 03/18/2018 CLINICAL DATA:  62 y/o  F; productive cough and fever for 2-3 days. EXAM: CHEST - 2 VIEW COMPARISON:  03/05/2018 PET-CT. FINDINGS: Normal cardiac silhouette. Aortic atherosclerosis with calcification. Stable right upper lobe mass better characterized on prior PET-CT. No new consolidation. No pleural effusion or pneumothorax. No acute osseous abnormality is evident. IMPRESSION: 1. Stable right upper lobe mass. 2. No new acute pulmonary process identified. Electronically Signed   By: Kristine Garbe M.D.   On: 03/18/2018 14:56   Dg Cervical Spine Complete  Result Date: 02/22/2018 CLINICAL DATA:  New onset neck pain for 5 weeks. EXAM: CERVICAL SPINE - COMPLETE 4+ VIEW COMPARISON:  None. FINDINGS: There is no evidence of cervical spine fracture or prevertebral soft tissue swelling. Alignment is normal. Degenerative disc disease with disc height loss at C5-6. Expansile ill-defined  bone lesion in the posterior aspect of the C2 spinous process with bone destruction which appears also involve C3 spinous process. IMPRESSION: Expansile ill-defined bone lesion in the posterior aspect of the C2 spinous process with bone destruction which appears also involve C3 spinous process. The appearance is most concerning for malignancy until proven otherwise. Recommend further evaluation with a CT or MRI of the cervical spine. No acute osseous injury of the cervical spine. These results will be called to the ordering clinician or representative by the Radiologist Assistant, and communication documented in the PACS or zVision Dashboard. Electronically Signed   By: Kathreen Devoid   On: 02/22/2018 14:18   Ct Angio Chest Pe W And/or Wo Contrast  Result Date: 03/18/2018 CLINICAL DATA:  Fever and productive cough.  Neck mass. EXAM: CT ANGIOGRAPHY CHEST WITH CONTRAST TECHNIQUE: Multidetector CT imaging of the chest  was performed using the standard protocol during bolus administration of intravenous contrast. Multiplanar CT image reconstructions and MIPs were obtained to evaluate the vascular anatomy. CONTRAST:  141mL ISOVUE-370 IOPAMIDOL (ISOVUE-370) INJECTION 76% COMPARISON:  PET-CT 03/05/2018. FINDINGS: Cardiovascular: The heart size is normal. No substantial pericardial effusion. No thoracic aortic aneurysm. Atherosclerotic calcification is noted in the wall of the thoracic aorta. No filling defect within the opacified pulmonary arteries to suggest the presence of an acute pulmonary embolus. Mediastinum/Nodes: 16 mm short axis precarinal lymph node evident. There is a 12 mm short axis right paratracheal lymph node. No left hilar lymphadenopathy. 13 mm short axis right hilar lymph node evident. The esophagus has normal imaging features. There is no axillary lymphadenopathy. Lungs/Pleura: The central tracheobronchial airways are patent. Posterior right upper lobe pulmonary mass measures 6.1 x 5.9 cm. This encases  and attenuates airways to the posterior right upper lobe. Lesion likely invades through the major fissure into the anterior aspect of the right upper lobe. Subsegmental atelectasis with confluent airspace consolidation noted in the posterior right middle lobe. This right middle lobe finding is new in the interval. Compressive atelectasis noted in the lower lobes bilaterally. Tiny right pleural effusion evident. 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 Musculoskeletal: No worrisome lytic or sclerotic osseous abnormality. Review of the MIP images confirms the above findings. IMPRESSION: 1. No CT evidence for acute pulmonary embolus. 2. Large posterior right upper lobe pulmonary mass with likely extension through the major fissure. This is associated with mediastinal and right hilar lymphadenopathy and necrotic left adrenal nodule. Disease characterized on recent PET-CT of 03/05/2018. 3. Posterior right middle lobe collapse/consolidation. This is new since 03/05/2018 and may be atelectasis and/or pneumonia. Electronically Signed   By: Misty Stanley M.D.   On: 03/18/2018 17:23   Mr Jeri Cos PO Contrast  Result Date: 03/09/2018 CLINICAL DATA:  Diffuse metastatic disease of unknown primary, possibly small-cell lung cancer. Staging. EXAM: MRI HEAD WITHOUT AND WITH CONTRAST TECHNIQUE: Multiplanar, multiecho pulse sequences of the brain and surrounding structures were obtained without and with intravenous contrast. CONTRAST:  74mL MULTIHANCE GADOBENATE DIMEGLUMINE 529 MG/ML IV SOLN COMPARISON:  Cervical spine MRI 02/24/2018. PET-CT 03/05/2018. No prior brain imaging. FINDINGS: Brain: There is no evidence of acute infarct, intracranial hemorrhage, mass, midline shift, or extra-axial fluid collection. Small foci of T2 hyperintensity are scattered throughout the predominantly subcortical cerebral white matter bilaterally,  mild-to-moderate for age. No abnormal brain parenchymal or meningeal enhancement suggestive of metastatic disease is identified. A small developmental venous anomaly is incidentally noted in the left parietal lobe. Vascular: Major intracranial vascular flow voids are preserved. Skull and upper cervical spine: Destructive mass involving the C2 posterior elements with dorsal epidural extension as described on the prior cervical spine MRI. Abnormal marrow signal in the anteroinferior C2 vertebral body as previously seen also concerning for metastasis. Sinuses/Orbits: Unremarkable orbits. Paranasal sinuses and mastoid air cells are clear. Other: 1.5 cm enhancing subcutaneous nodule in the right suboccipital region, hypermetabolic on PET-CT. IMPRESSION: 1. No evidence of intracranial metastatic disease. 2. 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 3. 1.5 cm right suboccipital subcutaneous nodule suspicious for metastasis. 4. Mild-to-moderate cerebral white matter T2 signal changes, nonspecific though most often seen with chronic small vessel ischemia. Electronically Signed   By: Logan Bores M.D.   On: 03/09/2018 09:45   Mr Cervical Spine Wo Contrast  Result Date: 02/25/2018 CLINICAL DATA:  Neck pain for 6 weeks.  Follow-up C2 lesion. EXAM: MRI CERVICAL SPINE WITHOUT CONTRAST TECHNIQUE: Multiplanar, multisequence MR imaging of the cervical spine was performed. No intravenous contrast was administered. COMPARISON:  Cervical spine radiographs February 22, 2018 FINDINGS: ALIGNMENT: Maintained cervical lordosis.  No malalignment. VERTEBRAE/DISCS: Expansile 3.6 x 5 x 5.8 cm (AP by transverse by cc) soft tissue mass arising from the posterior elements of C2, symmetrically invading the paraspinal soft tissues and dorsal epidural space. Low T1 and bright STIR signal ventral C2. Vertebral bodies otherwise intact. Moderate to severe C4-5 and C5 disc height loss with mild chronic discogenic endplate changes.  CORD:Cervical spinal cord is normal morphology and signal characteristics from the cervicomedullary junction to level of T2-3, the most caudal well visualized level. POSTERIOR FOSSA, VERTEBRAL ARTERIES, PARASPINAL TISSUES: No MR findings of ligamentous injury. Vertebral artery flow voids present. Included posterior fossa and paraspinal soft tissues are normal. DISC LEVELS: C2-3: Tumor within the dorsal epidural space effaces the posterior thecal sac, AP dimension of the canal is 10 mm. No neural foraminal narrowing. Mild facet arthropathy. C3-4: Small LEFT central disc protrusion, uncovertebral hypertrophy and mild facet arthropathy. No canal stenosis or neural foraminal narrowing. C4-5: Small broad-based disc bulge, uncovertebral hypertrophy and mild facet arthropathy. No canal stenosis. Minimal RIGHT neural foraminal narrowing. C5-6: Small broad-based disc bulge, uncovertebral hypertrophy and mild-to-moderate facet arthropathy. No canal stenosis. Mild bilateral neural foraminal narrowing. C6-7: Small broad-based disc bulge and central disc protrusion without canal stenosis or neural foraminal narrowing. C7-T1: No disc bulge, canal stenosis nor neural foraminal narrowing. IMPRESSION: 1. 3.6 x 5 x 5.8 cm soft tissue mass arising from posterior elements of C2 with paraspinal muscle and dorsal epidural invasion. Probable tumor within ventral C2. Differential diagnosis includes plasmacytoma and metastatic disease. Recommend PET-CT and/or sampling. 2. Degenerative cervical spine. No canal stenosis. Minimal to mild C4-5 and C5-6 neural foraminal narrowing. 3. These results will be called to the ordering clinician or representative by the Radiologist Assistant, and communication documented in the PACS or zVision Dashboard. Electronically Signed   By: Elon Alas M.D.   On: 02/25/2018 02:47   Nm Pet Image Initial (pi) Skull Base To Thigh  Result Date: 03/05/2018 CLINICAL DATA:  Initial treatment strategy for  paraspinal mass along the upper cervical spine EXAM: NUCLEAR MEDICINE PET SKULL BASE TO THIGH TECHNIQUE: 6.4 mCi F-18 FDG was injected intravenously. Full-ring PET imaging was performed from the skull base to thigh after the radiotracer. CT data was obtained and used for attenuation correction and anatomic localization. Fasting blood glucose: 101 mg/dl COMPARISON:  MRI 02/24/2018 FINDINGS: Mediastinal blood pool activity: SUV max 2.0 NECK: Intensely hypermetabolic paraspinal mass posterior to the C2, C3, and C4 vertebral bodies. Mass corresponds to mass lesion identified on comparison MRI. Mass extremely hypermetabolic active with SUV max equal 40. There is involvement of the spinous process at C2 and C3. Mild probable epidural extension of the tumor. There is a cutaneous lesion in the scalp posterior to the RIGHT occipital bone with SUV max equal 21.2 (image 10 of the fused data set. Incidental CT findings: none CHEST: Large intensely hypermetabolic mass in the RIGHT upper lobe measures 5.7 cm. (SUV max equal 28.4). There several adjacent satellite nodules upra inferior to the mass which are also intensely hypermetabolic. Hypermetabolic precarinal lymph nodes and RIGHT para lower tracheal lymph nodes with SUV max equal 7.6. The more distant nodule in the RIGHT lower lobe just above the RIGHT hemidiaphragm is also hypermetabolic Incidental CT  findings: none ABDOMEN/PELVIS: Hypermetabolic enlargement of the LEFT adrenal gland to 1.8 cm with SUV max equal 6.5. Hypermetabolic lesion along the posterior aspect the RIGHT hepatic lobe is within the intercostal musculature (SUV max equal 19.1, image 111) Dorsal to the LEFT psoas muscle, at the superior aspect of the SI joint, is a soft tissue mass measuring 1.7 cm with SUV max equal 34.8. Incidental CT findings: Uterus and ovaries are normal. Kidneys normal. Pancreas normal. SKELETON: Skeletal metastasis within the RIGHT sacral ala with SUV max equal 32. subtle  ground-glass lesion measuring 20 mm is present on image 151/4. Additional metastatic lesion in the posterior aspect of the LEFT iliac bone with SUV max equal 26. Additional metastatic lesion in the LEFT gluteus muscle which difficult to see on the CT portion exam present tense with SUV max equal 24. Incidental CT findings: none IMPRESSION: 1. Multiorgan intensely hypermetabolic metastatic disease. Favor primary small cell lung cancer versus less favored melanoma. 2. Large RIGHT upper lobe mass measures 5.7 cm and is favored primary lesion. 3. Intensely hypermetabolic posterior paraspinal mass involving the musculature and spinous processes of the upper cervical spine. 4. Small cutaneous lesion posterior to the RIGHT occipital bone is favor metastatic deposit. 5. Additional metastatic lesions in the LEFT adrenal gland and intracostal muscles of the posterior RIGHT lower ribs. Soft tissue metastasis dorsal to the LEFT psoas muscle. 6. Skeletal metastasis evident within the RIGHT sacral ala and LEFT ischium. If multidisciplinary follow up management is desired, this is available in the [place] through the [facility] [phone]. Electronically Signed   By: Suzy Bouchard M.D.   On: 03/05/2018 14:37   Ct Biopsy  Result Date: 03/08/2018 INDICATION: Concern for metastatic lung cancer. Please perform image guided biopsy hypermetabolic mass within the paraspinal musculature posterior to the superior/mid aspect of the cervical spine for tissue diagnostic purposes. EXAM: CT AND ULTRASOUND-GUIDED BIOPSY HYPERMETABOLIC MASS WITHIN THE PARASPINAL MUSCULATURE OF THE CERVICAL SPINE COMPARISON:  PET-CT - 03/05/2018; cervical spine MRI - 02/14/2018 MEDICATIONS: None. ANESTHESIA/SEDATION: Fentanyl 100 mcg IV; Versed 2 mg IV Sedation time: 19 minutes; The patient was continuously monitored during the procedure by the interventional radiology nurse under my direct supervision. CONTRAST:  None.  COMPLICATIONS: None immediate. PROCEDURE: Informed consent was obtained from the patient following an explanation of the procedure, risks, benefits and alternatives. A time out was performed prior to the initiation of the procedure. The patient was positioned prone on the CT table and a limited CT was performed for procedural planning demonstrating unchanged appearance of the partially calcified approximately 5.8 x 3.4 cm mass centered within the paraspinal musculatures posterior to the superior/mid cervical spine (image 22, series 2). Table position was marked and the mass was identified sonographically. The procedure was planned. The operative site was prepped and draped in the usual sterile fashion. Under direct ultrasound guidance, appropriate trajectory was confirmed with a 22 gauge spinal needle after the adjacent tissues were anesthetized with 1% Lidocaine with epinephrine. Appropriate positioning was confirmed with a limited CT scan. Next, under direct ultrasound guidance, a 17 gauge coaxial needle was advanced into the peripheral aspect of the mass. Appropriate positioning was confirmed and 6 core needle biopsy samples were obtained with an 18 gauge core needle biopsy device under direct ultrasound guidance. Multiple ultrasound images were saved for procedural documentation purposes. The co-axial needle was removed and hemostasis was achieved with manual compression. A limited postprocedural CT was negative for hemorrhage or additional complication. A dressing was  placed. The patient tolerated the procedure well without immediate postprocedural complication. IMPRESSION: Technically successful ultrasound and CT guided core needle biopsy of indeterminate hypermetabolic mass within the paraspinal musculatures about the cranial/mid aspect of the posterior cervical spine. Electronically Signed   By: Sandi Mariscal M.D.   On: 03/08/2018 13:16    Labs:  CBC: Recent Labs    03/06/18 1128 03/08/18 0936  03/18/18 1558 03/22/18 1159  WBC 12.5* 11.9* 13.2* 14.5*  HGB 12.3 11.5* 12.1 10.8*  HCT 38.1 36.2 37.7 34.6*  PLT 419* 369 522* 537*    COAGS: Recent Labs    03/08/18 0936 03/22/18 1159  INR 1.00 1.14  APTT  --  35    BMP: Recent Labs    03/06/18 1128 03/18/18 1558  NA 138 137  K 5.1 4.3  CL 103 96*  CO2 26 28  GLUCOSE 80 111*  BUN 9 8  CALCIUM 10.0 9.2  CREATININE 0.75 0.66  GFRNONAA >60 >60  GFRAA >60 >60    LIVER FUNCTION TESTS: Recent Labs    03/06/18 1128  BILITOT 0.2*  AST 33  ALT 49*  ALKPHOS 93  PROT 7.7  ALBUMIN 3.5    TUMOR MARKERS: No results for input(s): AFPTM, CEA, CA199, CHROMGRNA in the last 8760 hours.  Assessment and Plan: Patient with no significant past medical history, recent diagnosis of metastatic cancer of unknown primary presents with complaint of lung mass.  IR consulted for lung mass biopsy at the request of Dr. Mable Fill. Case reviewed by Dr. Pascal Lux who approves patient for procedure.  Patient presents today in their usual state of health.  She has been NPO and is not currently on blood thinners.   Risks and benefits discussed with the patient including, but not limited to bleeding, hemoptysis, respiratory failure requiring intubation, infection, pneumothorax requiring chest tube placement, stroke from air embolism or even death.  All of the patient's questions were answered, patient is agreeable to proceed. Consent signed and in chart.    Thank you for this interesting consult.  I greatly enjoyed meeting Rebecca Lucas and look forward to participating in their care.  A copy of this report was sent to the requesting provider on this date.  Electronically Signed: Docia Barrier, PA 03/22/2018, 1:34 PM   I spent a total of  30 Minutes   in face to face in clinical consultation, greater than 50% of which was counseling/coordinating care for lung mass.

## 2018-03-22 NOTE — Telephone Encounter (Signed)
Pt slides are packaged up and ready to mail to [facility]. Pathology is waiting for [facility] to call back with  fed ex number .

## 2018-03-23 ENCOUNTER — Encounter (HOSPITAL_COMMUNITY): Payer: Self-pay | Admitting: Emergency Medicine

## 2018-03-23 ENCOUNTER — Telehealth: Payer: Self-pay | Admitting: Radiation Therapy

## 2018-03-23 ENCOUNTER — Emergency Department (HOSPITAL_COMMUNITY)
Admission: EM | Admit: 2018-03-23 | Discharge: 2018-03-23 | Disposition: A | Discharge status: Home / Self Care | Payer: 59 | Attending: Emergency Medicine | Admitting: Emergency Medicine

## 2018-03-23 ENCOUNTER — Emergency Department (HOSPITAL_COMMUNITY): Payer: 59

## 2018-03-23 ENCOUNTER — Ambulatory Visit
Admission: RE | Admit: 2018-03-23 | Discharge: 2018-03-23 | Disposition: A | Discharge status: Home / Self Care | Payer: 59 | Source: Ambulatory Visit | Attending: Radiation Oncology | Admitting: Radiation Oncology

## 2018-03-23 ENCOUNTER — Other Ambulatory Visit: Payer: Self-pay

## 2018-03-23 VITALS — BP 113/56 | HR 98 | Temp 99.0°F | Resp 20

## 2018-03-23 DIAGNOSIS — C3411 Malignant neoplasm of upper lobe, right bronchus or lung: Secondary | ICD-10-CM | POA: Insufficient documentation

## 2018-03-23 DIAGNOSIS — R5082 Postprocedural fever: Secondary | ICD-10-CM | POA: Diagnosis not present

## 2018-03-23 DIAGNOSIS — Z79899 Other long term (current) drug therapy: Secondary | ICD-10-CM | POA: Diagnosis not present

## 2018-03-23 DIAGNOSIS — R05 Cough: Secondary | ICD-10-CM | POA: Insufficient documentation

## 2018-03-23 DIAGNOSIS — R221 Localized swelling, mass and lump, neck: Secondary | ICD-10-CM | POA: Insufficient documentation

## 2018-03-23 DIAGNOSIS — C7981 Secondary malignant neoplasm of breast: Secondary | ICD-10-CM | POA: Diagnosis not present

## 2018-03-23 DIAGNOSIS — C7951 Secondary malignant neoplasm of bone: Secondary | ICD-10-CM

## 2018-03-23 DIAGNOSIS — Z510 Encounter for antineoplastic radiation therapy: Secondary | ICD-10-CM | POA: Diagnosis not present

## 2018-03-23 LAB — CBC WITH DIFFERENTIAL/PLATELET
BASOS PCT: 0 %
Basophils Absolute: 0.1 10*3/uL (ref 0.0–0.1)
EOS ABS: 0.5 10*3/uL (ref 0.0–0.7)
Eosinophils Relative: 3 %
HCT: 32.1 % — ABNORMAL LOW (ref 36.0–46.0)
HEMOGLOBIN: 10.4 g/dL — AB (ref 12.0–15.0)
Lymphocytes Relative: 8 %
Lymphs Abs: 1.4 10*3/uL (ref 0.7–4.0)
MCH: 27.7 pg (ref 26.0–34.0)
MCHC: 32.4 g/dL (ref 30.0–36.0)
MCV: 85.6 fL (ref 78.0–100.0)
Monocytes Absolute: 1.2 10*3/uL — ABNORMAL HIGH (ref 0.1–1.0)
Monocytes Relative: 7 %
NEUTROS PCT: 82 %
Neutro Abs: 14.5 10*3/uL — ABNORMAL HIGH (ref 1.7–7.7)
PLATELETS: 559 10*3/uL — AB (ref 150–400)
RBC: 3.75 MIL/uL — AB (ref 3.87–5.11)
RDW: 13.8 % (ref 11.5–15.5)
WBC: 17.7 10*3/uL — AB (ref 4.0–10.5)

## 2018-03-23 LAB — BASIC METABOLIC PANEL
ANION GAP: 9 (ref 5–15)
BUN: 12 mg/dL (ref 6–20)
CHLORIDE: 99 mmol/L (ref 98–111)
CO2: 27 mmol/L (ref 22–32)
Calcium: 8.9 mg/dL (ref 8.9–10.3)
Creatinine, Ser: 0.83 mg/dL (ref 0.44–1.00)
GFR calc Af Amer: >60 mL/min (ref >60)
GFR calc non Af Amer: >60 mL/min (ref >60)
Glucose, Bld: 124 mg/dL — ABNORMAL HIGH (ref 70–99)
POTASSIUM: 4.3 mmol/L (ref 3.5–5.1)
SODIUM: 135 mmol/L (ref 135–145)

## 2018-03-23 LAB — I-STAT TROPONIN, ED: TROPONIN I, POC: 0 ng/mL (ref 0.00–0.08)

## 2018-03-23 LAB — I-STAT CG4 LACTIC ACID, ED: LACTIC ACID, VENOUS: 1.09 mmol/L (ref 0.5–1.9)

## 2018-03-23 MED ORDER — LEVOFLOXACIN 750 MG PO TABS: 750.0000 mg | ORAL_TABLET | Freq: Every day | ORAL | 0 refills | Status: DC

## 2018-03-23 NOTE — ED Provider Notes (Signed)
  Face-to-face evaluation   History: She is here for evaluation of fever with chills, starting today, currently on Levaquin for postobstructive pneumonia.  Patient's husband, a physician, states that patient typically runs systolic blood pressure is 95-100.  Physical exam: Alert, calm, cooperative.  She is conversant.  No respiratory distress.  She moves extremities equally.  Biopsy sites, upper neck, and right posterior thorax, appear normal without swelling, drainage or redness.  9:45 PM-discussed the case with the on-call oncologist, Dr. Payton Mccallum.  He does not think that the patient needs further management treatment at this time.  Medical screening examination/treatment/procedure(s) were conducted as a shared visit with non-physician practitioner(s) and myself.  I personally evaluated the patient during the encounter   Daleen Bo, MD 03/25/18 1515

## 2018-03-23 NOTE — Progress Notes (Signed)
Nurse monitoring following 2/5 intended srs treatments to c spine. Initial temperature 100.5. Assessed temperature again it proved to be 99. Patient reports she was diagnosed with pneumonia last Sunday and continues to take Levaquin. In addition, the patient reports that she had a lung biopsy yesterday and didn't run a fever. Patient denies taking decadron or being prescribed a medrol dose pack. Patient denies headache, diplopia or tinnitus. Instructed patient to avoid strenuous activity for the next 24 hours. In addition, instructed patient to call [phone] with needs reference her treatment. Finally, instructed her to present to the ER with a temperature greater than 101 or worsening symptoms. Patient verbalized understanding of all reviewed. Walked with patient to the main lobby. Steady gait noted. Discharged patient home with her ride in no distress.   BP (!) 113/56 (BP Location: Right Arm, Patient Position: Sitting, Cuff Size: Normal)   Pulse 98   Temp 99 F (37.2 C) (Oral)   Resp 20   SpO2 98%

## 2018-03-23 NOTE — ED Provider Notes (Addendum)
[facility] DEPT Provider Note   CSN: [id number] Arrival date & time: 03/23/18  1736     History   Chief Complaint Chief Complaint  Patient presents with  . Fever    HPI Rebecca Lucas is a 62 y.o. female.  The history is provided by the patient. No language interpreter was used.  Fever   Pertinent negatives include no headaches.     62 year old female with history of metastatic cancer with unknown etiology, here for evaluation of fever.  She has a neck mass which was biopsy on 03/08/2018.  It was poorly differentiated.    Patient reports a week ago she was feeling bad, having productive cough.  A chest CTA was performed during her hospital visit and was found to have a large posterior right upper lobe pulmonary mass with likely extension through the major fissure.  A posterior right middle lobe collapse/consolidation which is new and may be atelectasis or pneumonia.  She was treated for pneumonia on Levaquin.  She mentioned she felt better after treatment, she had a biopsy done in the right chest yesterday as well as receiving radiation to the right side of her neck today.  However, she shortly developed a temperature of 101 at home this afternoon.  After talking to her specialist, she was recommended to come to ER for further evaluation of her condition.  Currently she felt normal after taking her Tylenol prior to arrival.  She denies any significant shortness of breath or productive cough.  She denies headache, neck pain, abdominal pain, nausea vomiting or diarrhea.  Her symptom is mild currently.  History reviewed. No pertinent past medical history.  Patient Active Problem List   Diagnosis Date Noted  . 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 Metastatic malignant neoplasm ([place]) 03/06/2018  . Mass of upper lobe of right lung 03/06/2018  . Bone metastasis ([place]) 03/06/2018    History reviewed. No pertinent surgical history.   OB History   None       Home Medications    Prior to Admission medications   Medication Sig Start Date End Date Taking? Authorizing Provider  acetaminophen (TYLENOL) 500 MG tablet Take 1,000 mg by mouth every 6 (six) hours as needed for mild pain.     [provider]  alendronate (FOSAMAX) 70 MG tablet Take 70 mg by mouth once a week. Wednesday 01/12/18   [provider]  butalbital-aspirin-caffeine Acquanetta Chain) 50-325-40 MG capsule Take 1 capsule by mouth every 6 (six) hours as needed for migraine.  12/05/17   [provider]  Calcium Carbonate-Vitamin D (CALCIUM-D PO) Take 1 tablet by mouth 2 (two) times daily.    [provider]  fentaNYL (DURAGESIC - DOSED MCG/HR) 25 MCG/HR patch Place 1 patch (25 mcg total) onto the skin every 3 (three) days. 03/14/18   Curt Bears, MD  ibuprofen (ADVIL,MOTRIN) 200 MG tablet Take 600 mg by mouth every 6 (six) hours as needed for mild pain.     [provider]  levofloxacin (LEVAQUIN) 750 MG tablet Take 1 tablet (750 mg total) by mouth daily. X 7 days 03/18/18   Jacqlyn Larsen, PA-C  LUTEIN PO Take 1 tablet by mouth daily.    [provider]  traMADol (ULTRAM) 50 MG tablet Take 50 mg by mouth 4 (four) times daily as needed (pain).  03/05/18   [provider]  zolpidem (AMBIEN) 10 MG tablet Take 1 tablet (10 mg total) by mouth at bedtime as needed  for sleep. 03/21/18   Curt Bears, MD    Family History Family History  Problem Relation Age of Onset  . ALS Mother 43       non genetic  . Cancer Neg Hx     Social History Social History   Tobacco Use  . Smoking status: Never Smoker  . Smokeless tobacco: Never Used  Substance Use Topics  . Alcohol use: Yes    Comment: occasionally  . Drug use: Never     Allergies   Patient has no known allergies.   Review of Systems Review of Systems  Constitutional: Positive for fever.  Respiratory: Negative for shortness of breath.   Neurological: Negative for  headaches.  All other systems reviewed and are negative.    Physical Exam Updated Vital Signs BP (!) 126/48 (BP Location: Left Arm)   Pulse 86   Temp 98.1 F (36.7 C) (Oral)   Resp 18   SpO2 100%   Physical Exam  Constitutional: She is oriented to person, place, and time. She appears well-developed and well-nourished. No distress.  HENT:  Head: Atraumatic.  Mouth/Throat: Oropharynx is clear and moist.  Eyes: Conjunctivae are normal.  Neck: Neck supple.  Right posterior neck mass nontender to palpation.  Cardiovascular: Normal rate and regular rhythm.  Pulmonary/Chest: Effort normal and breath sounds normal. No stridor. No respiratory distress. She has no wheezes. She has no rales. She exhibits no tenderness.  Abdominal: Soft. She exhibits no distension. There is no tenderness.  Musculoskeletal: She exhibits no edema.  Neurological: She is alert and oriented to person, place, and time.  Skin: No rash noted.  Psychiatric: She has a normal mood and affect.  Nursing note and vitals reviewed.    ED Treatments / Results  Labs (all labs ordered are listed, but only abnormal results are displayed) Labs Reviewed  BASIC METABOLIC PANEL - Abnormal; Notable for the following components:      Result Value   Glucose, Bld 124 (*)    All other components within normal limits  CBC WITH DIFFERENTIAL/PLATELET - Abnormal; Notable for the following components:   WBC 17.7 (*)    RBC 3.75 (*)    Hemoglobin 10.4 (*)    HCT 32.1 (*)    Platelets 559 (*)    Neutro Abs 14.5 (*)    Monocytes Absolute 1.2 (*)    All other components within normal limits  CULTURE, BLOOD (ROUTINE X 2)  CULTURE, BLOOD (ROUTINE X 2)  I-STAT TROPONIN, ED  I-STAT CG4 LACTIC ACID, ED    EKG None   Date: 04/02/2018  Rate: 78  Rhythm: normal sinus rhythm  QRS Axis: normal  Intervals: normal  ST/T Wave abnormalities: normal  Conduction Disutrbances: none  Narrative Interpretation:   Old EKG Reviewed: No  significant changes noted     Radiology Ct Biopsy  Result Date: 03/22/2018 INDICATION: Right upper lobe hypermetabolic pulmonary mass. Note, patient underwent successful CT and ultrasound-guided biopsy of occipital mass on 03/08/2018 however pathology remains indeterminate. Request has made for CT-guided biopsy of presumed primary malignancy within the right upper lobe. EXAM: CT GUIDED RIGHT UPPER LOBE PULMONARY MASS BIOPSY COMPARISON:  PET-CT - 03/05/2018; chest CT - 03/18/2018 MEDICATIONS: None. ANESTHESIA/SEDATION: Fentanyl 50 mcg IV; Versed 1 mg IV Sedation time: 22 minutes; The patient was continuously monitored during the procedure by the interventional radiology nurse under my direct supervision. CONTRAST:  None COMPLICATIONS: None immediate. PROCEDURE: Informed consent was obtained from the patient following an explanation of the  procedure, risks, benefits and alternatives. The patient understands,agrees and consents for the procedure. All questions were addressed. A time out was performed prior to the initiation of the procedure. The patient was positioned left lateral decubitus, slightly LPO on the CT table and a limited chest CT was performed for procedural planning demonstrating unchanged size and appearance of the approximately 6.3 x 5.7 cm hypermetabolic right upper lobe pulmonary mass (image 20, series 3). The operative site was prepped and draped in the usual sterile fashion. Under sterile conditions and local anesthesia, a 17 gauge coaxial needle was advanced into the peripheral aspect of the nodule. Positioning was confirmed with intermittent CT fluoroscopy and followed by the acquisition of 6 core needle biopsy samples with an 18 gauge core needle biopsy device. The coaxial needle was removed following deployment of a Biosentry plug and superficial hemostasis was achieved with manual compression. Limited post procedural chest CT was negative for pneumothorax or additional complication. A  dressing was placed. The patient tolerated the procedure well without immediate postprocedural complication. The patient was escorted to have an upright chest radiograph. IMPRESSION: Technically successful CT guided core needle core biopsy of hypermetabolic right upper lobe pulmonary mass. Electronically Signed   By: Sandi Mariscal M.D.   On: 03/22/2018 17:18   Dg Chest Port 1 View  Result Date: 03/22/2018 CLINICAL DATA:  Right lung biopsy from mass EXAM: PORTABLE CHEST 1 VIEW COMPARISON:  03/18/2018 CXR, same day CT chest for lung biopsy FINDINGS: No pneumothorax status post biopsy of right upper lobe mass. Heart and mediastinal contours are stable. Aortic atherosclerosis noted at the arch without aneurysmal dilatation. No effusion. No aggressive osseous abnormality. IMPRESSION: No pneumothorax status post biopsy of right upper lobe mass. Electronically Signed   By: Ashley Royalty M.D.   On: 03/22/2018 16:00    Procedures Procedures (including critical care time)  Medications Ordered in ED Medications - No data to display   Initial Impression / Assessment and Plan / ED Course  I have reviewed the triage vital signs and the nursing notes.  Pertinent labs & imaging results that were available during my care of the patient were reviewed by me and considered in my medical decision making (see chart for details).     BP (!) 96/53 (BP Location: Left Arm)   Pulse 80   Temp 98.1 F (36.7 C) (Oral)   Resp 16   SpO2 100%    Final Clinical Impressions(s) / ED Diagnoses   Final diagnoses:  Post-procedural fever    ED Discharge Orders        Ordered    levofloxacin (LEVAQUIN) 750 MG tablet  Daily     03/23/18 2155     7:07 PM Patient discovered a right posterior neck mass a month ago and since then has been diagnosed with undifferentiated carcinoma along with having a right lung mass recently biopsied.  She received radiation to her neck.  She also was given Levaquin a week ago for potential  pneumonia.  She is here for a fever of 101 reportedly earlier today.  She is currently resting comfortably, no hypoxia, afebrile with stable normal vital sign.  Will obtain screening chest x-ray, basic labs, and will monitor closely.  Care discussed with Dr. Eulis Foster.  9:52 PM CXR unchanged from prior.  WBC 17.7, mildly elevated from prior.  Normal lactic acid.  Normal electrolytes and normal trop.  Dr. Eulis Foster have reached out to [facility] oncologist DR. Gordina who recommend pt to finish the rest  of her levaquin (3 more days) and f/u outpt for further care.  Pt's currently BP is 96/53, which is at her baseline.  Pt stable for discharge. Return precaution given.     Domenic Moras, PA-C 03/23/18 2155    Daleen Bo, MD 03/25/18 1514  ADDENDUM: EKG reviewed by me and documented   Domenic Moras, PA-C 04/02/18 Wendie Agreste, MD 04/02/18 [phone]

## 2018-03-23 NOTE — ED Notes (Signed)
Pulse oximetry once 100% on RA

## 2018-03-23 NOTE — Telephone Encounter (Addendum)
Rebecca Lucas, RT reported to Joaquim Lai, RN that the patient phoned back with a fever of 101.8 and chills. Patient denies chest tightness or shortness of breath. Patient is asymptomatic outside of stiffness in her neck. Consulted with Dr. Lisbeth Renshaw, Dr. Ronny Bacon (who performed her lung biopsy yesterday, and Sandi Mealy PA in Dr. Worthy Flank office. The consensus is that the patient should return to the emergency room at [facility] for IV antibiotics since the patient's fever continues to spike and WBC continues to rise on po Levaquin. Phoned patient and relayed all of this information to her. Patient states, "I will be there in thirty minutes." [name], PA-C spoke with Rebecca Board, RN in the emergency room informing her of our suspicions. This note was written by Joaquim Lai, RN for Dr. Tyler Pita.

## 2018-03-23 NOTE — ED Triage Notes (Addendum)
Patient BIB husband, states today was second round of radiation to neck mass. States fever developed after that time. 101.8 at home. Took tylenol and motrin PTA. Afebrile in triage. Had lung biopsy on Thursday. Seen on 7/7 for cough. Reports taking abx as prescribed. States cough has decreased.

## 2018-03-23 NOTE — Telephone Encounter (Signed)
I was contacted at around 4:30 PM today with the report that the patient has a fever of 101.8 with chills today despite being on Levaquin since 03/18/2018.  I advised Freeman Caldron, PA-C that Ms. Ausley should return to the emergency room for evaluation and management.  I told Ms. Vincent Gros that the patient would likely need to begin IV antibiotics.  The patient has been contacted and reports that she will present to the emergency room within the next 30 minutes.

## 2018-03-23 NOTE — Discharge Instructions (Addendum)
Your fever may be due to recent radiation procedure. Please continue with your levaquin for 3 additional days.  Follow up with your oncologist on Monday for further care.  Return if you have any concerns.

## 2018-03-23 NOTE — ED Notes (Signed)
Per Watson PA, patient called office complaining of fever

## 2018-03-24 LAB — CULTURE, BLOOD (ROUTINE X 2)
Culture: NO GROWTH
Culture: NO GROWTH
Special Requests: ADEQUATE
Special Requests: ADEQUATE

## 2018-03-24 NOTE — Progress Notes (Signed)
  Radiation Oncology         [phone] ________________________________  Name: Rebecca Lucas MRN: [id number]  Date: 03/21/2018  DOB: 01-31-1956   SPECIAL TREATMENT PROCEDURE   3D TREATMENT PLANNING AND DOSIMETRY: The patient's radiation plan was reviewed and approved by Dr. Vertell Limber from neurosurgery and radiation oncology prior to treatment. It showed 3-dimensional radiation distributions overlaid onto the planning CT/MRI image set. The [facility] for the target structures as well as the organs at risk were reviewed. The documentation of the 3D plan and dosimetry are filed in the radiation oncology EMR.   NARRATIVE: The patient was brought to the TrueBeam stereotactic radiation treatment machine and placed supine on the CT couch. The head frame was applied, and the patient was set up for stereotactic radiosurgery. Neurosurgery was present for the set-up and delivery   SIMULATION VERIFICATION: In the couch zero-angle position, the patient underwent Exactrac imaging using the Brainlab system with orthogonal KV images. These were carefully aligned and repeated to confirm treatment position for each of the isocenters. The Exactrac snap film verification was repeated at each couch angle.   SPECIAL TREATMENT PROCEDURE: The patient received stereotactic radiosurgery to the following target:  PTV1 spine target was treated using 3 Arcs to a prescription dose of 10 Gy. ExacTrac Snap verification was performed for each couch angle.   STEREOTACTIC TREATMENT MANAGEMENT: Following delivery, the patient was transported to nursing in stable condition and monitored for possible acute effects. Vital signs were recorded . The patient tolerated treatment without significant acute effects, and was discharged to home in stable condition.  PLAN: the patient will return on Friday for her second fraction of radiation treatment. She will receive a total of 50  gray.   ------------------------------------------------  Jodelle Gross, MD, PhD

## 2018-03-24 NOTE — Progress Notes (Signed)
  Radiation Oncology         [phone] ________________________________  Name: Rebecca Lucas MRN: [id number]  Date: 03/23/2018  DOB: 11-Nov-1955   SPECIAL TREATMENT PROCEDURE   3D TREATMENT PLANNING AND DOSIMETRY: The patient's radiation plan was reviewed and approved by Dr. Vertell Limber from neurosurgery and radiation oncology prior to treatment. It showed 3-dimensional radiation distributions overlaid onto the planning CT/MRI image set. The [facility] for the target structures as well as the organs at risk were reviewed. The documentation of the 3D plan and dosimetry are filed in the radiation oncology EMR.   NARRATIVE: The patient was brought to the TrueBeam stereotactic radiation treatment machine and placed supine on the CT couch. The head frame was applied, and the patient was set up for stereotactic radiosurgery. Neurosurgery was present for the set-up and delivery   SIMULATION VERIFICATION: In the couch zero-angle position, the patient underwent Exactrac imaging using the Brainlab system with orthogonal KV images. These were carefully aligned and repeated to confirm treatment position for each of the isocenters. The Exactrac snap film verification was repeated at each couch angle.   SPECIAL TREATMENT PROCEDURE: The patient received stereotactic radiosurgery to the following target:  PTV1 spine target was treated using 3 Arcs to a prescription dose of 10 Gy. ExacTrac Snap verification was performed for each couch angle.   STEREOTACTIC TREATMENT MANAGEMENT: Following delivery, the patient was transported to nursing in stable condition and monitored for possible acute effects. Vital signs were recorded . The patient tolerated treatment without significant acute effects, and was discharged to home in stable condition.  PLAN: the patient will continue next week with her 3 remaining fractions to yield a total dose of 50 gray.   ------------------------------------------------  Jodelle Gross, MD,  PhD

## 2018-03-26 ENCOUNTER — Ambulatory Visit
Admission: RE | Admit: 2018-03-26 | Discharge: 2018-03-26 | Disposition: A | Discharge status: Home / Self Care | Payer: 59 | Source: Ambulatory Visit | Attending: Radiation Oncology | Admitting: Radiation Oncology

## 2018-03-26 VITALS — BP 108/60 | HR 87 | Temp 98.1°F | Resp 18

## 2018-03-26 DIAGNOSIS — C7951 Secondary malignant neoplasm of bone: Secondary | ICD-10-CM

## 2018-03-26 DIAGNOSIS — C3411 Malignant neoplasm of upper lobe, right bronchus or lung: Secondary | ICD-10-CM | POA: Diagnosis not present

## 2018-03-26 DIAGNOSIS — Z510 Encounter for antineoplastic radiation therapy: Secondary | ICD-10-CM | POA: Diagnosis not present

## 2018-03-26 DIAGNOSIS — C7981 Secondary malignant neoplasm of breast: Secondary | ICD-10-CM | POA: Diagnosis not present

## 2018-03-26 NOTE — Progress Notes (Signed)
Nurse monitoring following 3/5 intended srs treatments to c spine. Weight and vitals stable. Patient denies taking decadron or being prescribed a medrol dose pack. Patient denies headache, diplopia or tinnitus. Instructed patient to avoid strenuous activity for the next 24 hours. In addition, instructed patient to call [phone] with needs reference her treatment.  Walked with patient to CT simulation to planning scan for hip treatment.  Steady gait noted. Discharged patient home with her Dad in no distress.

## 2018-03-26 NOTE — Progress Notes (Signed)
  Radiation Oncology         [phone] ________________________________  Spinal Stereotactic Radiosurgery Procedure Note  Name: Rebecca Lucas MRN: [id number]  Date: 03/26/2018  DOB: 1956-04-07  SPECIAL TREATMENT PROCEDURE  No diagnosis found.  3D TREATMENT PLANNING AND DOSIMETRY:  The patient's radiation plan was reviewed and approved by neurosurgery and radiation oncology prior to treatment.  It showed 3-dimensional radiation distributions overlaid onto the planning CT/MRI image set.  The [facility] for the target structures as well as the organs at risk were reviewed. The documentation of the 3D plan and dosimetry are filed in the radiation oncology EMR.  NARRATIVE:  Rebecca Lucas was brought to the TrueBeam stereotactic radiation treatment machine and placed supine on the CT couch. The patient was precisely re-positioned in their BodyFix immobilization device, and the patient was set up for stereotactic radiosurgery.  Neurosurgery was present for the set-up and delivery  SIMULATION VERIFICATION:  In the couch zero-angle position, the patient underwent Exactrac imaging using the Brainlab system with orthogonal KV images to position the target accounting for translation and rotational factors.  These were carefully aligned and repeated to confirm treatment position.  Then, cone beam CT was performed to help further verify placement and make any final translational shifts.  SPECIAL TREATMENT PROCEDURE: Rebecca Lucas received stereotactic radiosurgery to the following targets:  The targeted metastasis in the posterior elements of C2-4 vertebral bodies was treated using 3 Rapid Arc VMAT Beams to a prescription dose of 10 Gy representing one of five fractions to 50 Gy total to the gross disease (GTV) seen on imaging.   The beams were delivered with 6 MV X-rays in the flattening filter free mode.  STEREOTACTIC TREATMENT MANAGEMENT:  Following delivery, the patient was transported to nursing  in stable condition and monitored for possible acute effects.  Vital signs were recorded There were no vitals taken for this visit.. The patient tolerated treatment without significant acute effects, and was discharged to home in stable condition.    PLAN: Follow-up in one month.  ________________________________  Sheral Apley. Tammi Klippel, M.D.  This document serves as a record of services personally performed by Tyler Pita, MD. It was created on his behalf by Margit Banda, a trained medical scribe. The creation of this record is based on the scribe's personal observations and the provider's statements to them. This document has been checked and approved by the attending provider.

## 2018-03-26 NOTE — Progress Notes (Signed)
  Radiation Oncology         [phone] ________________________________  Name: Rebecca Lucas MRN: [id number]  Date: 03/26/2018  DOB: Aug 14, 1956  SIMULATION AND TREATMENT PLANNING NOTE    ICD-10-CM   1. Bone metastasis ([place]) C79.51     DIAGNOSIS:  62 y.o. woman with diffuse metastatic disease of unknown primary with painful right hip involvement  NARRATIVE:  The patient was brought to the [facility].  Identity was confirmed.  All relevant records and images related to the planned course of therapy were reviewed.  The patient freely provided informed written consent to proceed with treatment after reviewing the details related to the planned course of therapy. The consent form was witnessed and verified by the simulation staff.  Then, the patient was set-up in a stable reproducible  supine position for radiation therapy.  CT images were obtained.  Surface markings were placed.  The CT images were loaded into the planning software.  Then the target and avoidance structures were contoured.  Treatment planning then occurred.  The radiation prescription was entered and confirmed.  Then, I designed and supervised the construction of a total of 1 medically necessary complex treatment positioning device.  I have requested : Isodose Plan.  I  PLAN:  The patient will receive 30 Gy in 10 fraction.  ________________________________  Sheral Apley Tammi Klippel, M.D.   This document serves as a record of services personally performed by Tyler Pita, MD. It was created on his behalf by Margit Banda, a trained medical scribe. The creation of this record is based on the scribe's personal observations and the provider's statements to them. This document has been checked and approved by the attending provider.

## 2018-03-28 ENCOUNTER — Encounter: Payer: Self-pay | Admitting: *Deleted

## 2018-03-28 ENCOUNTER — Telehealth: Payer: Self-pay | Admitting: Internal Medicine

## 2018-03-28 ENCOUNTER — Ambulatory Visit
Admission: RE | Admit: 2018-03-28 | Discharge: 2018-03-28 | Disposition: A | Discharge status: Home / Self Care | Payer: 59 | Source: Ambulatory Visit | Attending: Radiation Oncology | Admitting: Radiation Oncology

## 2018-03-28 DIAGNOSIS — Z510 Encounter for antineoplastic radiation therapy: Secondary | ICD-10-CM | POA: Diagnosis not present

## 2018-03-28 DIAGNOSIS — R69 Illness, unspecified: Secondary | ICD-10-CM | POA: Diagnosis not present

## 2018-03-28 DIAGNOSIS — C7800 Secondary malignant neoplasm of unspecified lung: Secondary | ICD-10-CM | POA: Diagnosis not present

## 2018-03-28 DIAGNOSIS — C7981 Secondary malignant neoplasm of breast: Secondary | ICD-10-CM | POA: Diagnosis not present

## 2018-03-28 DIAGNOSIS — C490 Malignant neoplasm of connective and soft tissue of head, face and neck: Secondary | ICD-10-CM | POA: Diagnosis not present

## 2018-03-28 DIAGNOSIS — C3411 Malignant neoplasm of upper lobe, right bronchus or lung: Secondary | ICD-10-CM | POA: Diagnosis not present

## 2018-03-28 DIAGNOSIS — C7951 Secondary malignant neoplasm of bone: Secondary | ICD-10-CM | POA: Diagnosis not present

## 2018-03-28 DIAGNOSIS — C7801 Secondary malignant neoplasm of right lung: Secondary | ICD-10-CM | POA: Diagnosis not present

## 2018-03-28 LAB — CULTURE, BLOOD (ROUTINE X 2)
CULTURE: NO GROWTH
CULTURE: NO GROWTH
SPECIAL REQUESTS: ADEQUATE
Special Requests: ADEQUATE

## 2018-03-28 NOTE — Progress Notes (Signed)
Per Dr. Julien Nordmann, I called pathology to check on cancer type DX results. I was told they will be resulted out on 04/02/18. I will update Dr. Julien Nordmann.

## 2018-03-28 NOTE — Progress Notes (Signed)
Oncology Nurse Navigator Documentation  Oncology Nurse Navigator Flowsheets 03/28/2018  Navigator Location [facility]  Navigator Encounter Type Other/Recieved a call from Dr. Johnsie Kindred about foundation one results. I check foundation one portal and I called them due to not finding results.  I was update that results will be completed on 04/05/18.  I called his office to update them and left a message.  I will also update Dr. Julien Nordmann.   Treatment Phase Pre-Tx/Tx Discussion  Barriers/Navigation Needs Coordination of Care  Interventions Coordination of Care  Coordination of Care Other  Acuity Level 2  Time Spent with Patient 30

## 2018-03-28 NOTE — Telephone Encounter (Signed)
Faxed medical records to [facility] at [facility] at [phone], on 03/28/18, Release ID: [id number]

## 2018-03-29 ENCOUNTER — Ambulatory Visit
Admission: RE | Admit: 2018-03-29 | Discharge: 2018-03-29 | Disposition: A | Discharge status: Home / Self Care | Payer: 59 | Source: Ambulatory Visit | Attending: Radiation Oncology | Admitting: Radiation Oncology

## 2018-03-29 DIAGNOSIS — C7981 Secondary malignant neoplasm of breast: Secondary | ICD-10-CM | POA: Diagnosis not present

## 2018-03-29 DIAGNOSIS — C7951 Secondary malignant neoplasm of bone: Secondary | ICD-10-CM | POA: Diagnosis not present

## 2018-03-29 DIAGNOSIS — Z510 Encounter for antineoplastic radiation therapy: Secondary | ICD-10-CM | POA: Diagnosis not present

## 2018-03-29 DIAGNOSIS — C7800 Secondary malignant neoplasm of unspecified lung: Secondary | ICD-10-CM | POA: Diagnosis not present

## 2018-03-29 DIAGNOSIS — C3411 Malignant neoplasm of upper lobe, right bronchus or lung: Secondary | ICD-10-CM | POA: Diagnosis not present

## 2018-03-29 DIAGNOSIS — C3490 Malignant neoplasm of unspecified part of unspecified bronchus or lung: Secondary | ICD-10-CM | POA: Diagnosis not present

## 2018-03-30 ENCOUNTER — Encounter: Payer: Self-pay | Admitting: Radiation Oncology

## 2018-03-30 ENCOUNTER — Ambulatory Visit
Admission: RE | Admit: 2018-03-30 | Discharge: 2018-03-30 | Disposition: A | Discharge status: Home / Self Care | Payer: 59 | Source: Ambulatory Visit | Attending: Radiation Oncology | Admitting: Radiation Oncology

## 2018-03-30 VITALS — BP 100/50 | HR 73 | Temp 97.7°F | Resp 18

## 2018-03-30 DIAGNOSIS — C7981 Secondary malignant neoplasm of breast: Secondary | ICD-10-CM | POA: Diagnosis not present

## 2018-03-30 DIAGNOSIS — C3411 Malignant neoplasm of upper lobe, right bronchus or lung: Secondary | ICD-10-CM | POA: Diagnosis not present

## 2018-03-30 DIAGNOSIS — Z510 Encounter for antineoplastic radiation therapy: Secondary | ICD-10-CM | POA: Diagnosis not present

## 2018-03-30 DIAGNOSIS — C7951 Secondary malignant neoplasm of bone: Secondary | ICD-10-CM

## 2018-03-30 MED FILL — ALENDRONATE NA 70 MG TAB: 70 | 84 days supply | Qty: 12 | Fill #1

## 2018-03-30 NOTE — Progress Notes (Signed)
Nurse monitoring following 5/5 intended srs treatments to c spine complete. Weight and vitals stable. Patient denies taking decadron or being prescribed a medrol dose pack. Patient denies headache, diplopia, dizziness or tinnitus. Patient comment that her neck mobility is much better. Patient denies numbness or tingling of extremities. Instructed patient to avoid strenuous activity for the next 24 hours. In addition, instructed patient to call [phone] and ask for the radiation oncologist on call with and needs that present reference her treatment. Ambulated with patient to lobby. Steady gait noted. No distress noted.  BP (!) 100/50 (BP Location: Right Arm, Patient Position: Sitting, Cuff Size: Normal)   Pulse 73   Temp 97.7 F (36.5 C) (Oral)   Resp 18   SpO2 100%

## 2018-04-02 ENCOUNTER — Other Ambulatory Visit: Payer: Self-pay | Admitting: Radiation Oncology

## 2018-04-02 ENCOUNTER — Ambulatory Visit
Admission: RE | Admit: 2018-04-02 | Discharge: 2018-04-02 | Disposition: A | Discharge status: Home / Self Care | Payer: 59 | Source: Ambulatory Visit | Attending: Radiation Oncology | Admitting: Radiation Oncology

## 2018-04-02 ENCOUNTER — Telehealth: Payer: Self-pay | Admitting: *Deleted

## 2018-04-02 ENCOUNTER — Encounter (HOSPITAL_COMMUNITY): Payer: Self-pay | Admitting: Internal Medicine

## 2018-04-02 DIAGNOSIS — C7981 Secondary malignant neoplasm of breast: Secondary | ICD-10-CM | POA: Diagnosis not present

## 2018-04-02 DIAGNOSIS — C3411 Malignant neoplasm of upper lobe, right bronchus or lung: Secondary | ICD-10-CM | POA: Diagnosis not present

## 2018-04-02 DIAGNOSIS — Z510 Encounter for antineoplastic radiation therapy: Secondary | ICD-10-CM | POA: Diagnosis not present

## 2018-04-02 MED ORDER — NYSTATIN 100000 UNIT/ML MT SUSP: 5.0000 mL | Freq: Four times a day (QID) | OROMUCOSAL | 1 refills | Status: DC

## 2018-04-02 MED ORDER — PROCHLORPERAZINE MALEATE 10 MG PO TABS: 10.0000 mg | ORAL_TABLET | Freq: Four times a day (QID) | ORAL | 0 refills | Status: DC | PRN

## 2018-04-02 MED FILL — PROCHLORPERAZINE 10 MG TAB: 10 | 7 days supply | Qty: 30 | Fill #0

## 2018-04-02 MED FILL — NYSTATIN 100,000 UNITS/ML S: 100000 | 3 days supply | Qty: 60 | Fill #0

## 2018-04-02 NOTE — Telephone Encounter (Signed)
Oncology Nurse Navigator Documentation  Oncology Nurse Navigator Flowsheets 04/02/2018  Navigator Location [facility]  Navigator Encounter Type Telephone/per Dr. Worthy Flank request, he asked that I call patient and schedule an appt with him on 04/04/18 to go over next steps with her. I called her but was unable to reach her.  I did leave vm message for her to call with my name and phone number.   Telephone Outgoing Call  Treatment Phase Treatment  Barriers/Navigation Needs Education  Education Other  Interventions Education  Education Method Verbal  Acuity Level 2  Time Spent with Patient 15

## 2018-04-02 NOTE — Telephone Encounter (Signed)
Oncology Nurse Navigator Documentation  Oncology Nurse Navigator Flowsheets 04/02/2018  Navigator Location [facility]  Navigator Encounter Type Telephone  Telephone Outgoing Call/per Dr. Julien Nordmann, I called Rebecca Lucas to schedule her to see him this week.  I spoke with her and update.   Dr. Julien Nordmann requested I call pathology for cancer type DX information.  I did and results are in.  I will update Dr. Julien Nordmann.   Treatment Phase Treatment  Barriers/Navigation Needs Coordination of Care  Interventions Coordination of Care  Coordination of Care Appts  Acuity Level 1  Time Spent with Patient 15

## 2018-04-02 NOTE — Progress Notes (Signed)
  Radiation Oncology         [phone]) [phone] ________________________________  Name: Rebecca Lucas MRN: [id number]  Date: 03/30/2018  DOB: 07/04/1956  End of Treatment Note  Diagnosis:   62 y.o. 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  Beams/energy:   SBRT/SRT-VMAT // 10X-FFF Photon  Narrative: The patient tolerated radiation treatment relatively well.  No significant acute effects.  Plan: The patient has completed radiation treatment. The patient will return to radiation oncology clinic for routine followup in one month. I advised her to call or return sooner if she has any questions or concerns related to her recovery or treatment. ________________________________  Sheral Apley. Tammi Klippel, M.D.  This document serves as a record of services personally performed by Tyler Pita, MD. It was created on his behalf by Rae Lips, a trained medical scribe. The creation of this record is based on the scribe's personal observations and the provider's statements to them. This document has been checked and approved by the attending provider.

## 2018-04-02 NOTE — Progress Notes (Signed)
I saw the patient today and she has evidence of oral thrush and complains of nausea. She was recently treated for C2 disease and is not on any steroids, but has noticed increased dryness in her throat and some soreness as a result. We discussed the use of Compazine due to MOA and her recently treated C2 disease, as well as nystatin suspension. She will swish and swallow until yeast is completely gone. Medications were sent to her pharmacy.    Carola Rhine, PAC

## 2018-04-02 NOTE — Progress Notes (Signed)
Received patient in the clinic following radiation therapy to hip. Recently, patient completed fractionated SRS treatment to cervical spine. Patient reports new onset queasiness, dry mouth, and mouth sores. White pustules noted under tongue, base of tongue and left buccal mucosa. Vitals stable. Informed Shona Simpson, PA-C of these findings.

## 2018-04-03 ENCOUNTER — Ambulatory Visit
Admission: RE | Admit: 2018-04-03 | Discharge: 2018-04-03 | Disposition: A | Discharge status: Home / Self Care | Payer: 59 | Source: Ambulatory Visit | Attending: Radiation Oncology | Admitting: Radiation Oncology

## 2018-04-03 DIAGNOSIS — C7981 Secondary malignant neoplasm of breast: Secondary | ICD-10-CM | POA: Diagnosis not present

## 2018-04-03 DIAGNOSIS — C3411 Malignant neoplasm of upper lobe, right bronchus or lung: Secondary | ICD-10-CM | POA: Diagnosis not present

## 2018-04-03 DIAGNOSIS — Z510 Encounter for antineoplastic radiation therapy: Secondary | ICD-10-CM | POA: Diagnosis not present

## 2018-04-04 ENCOUNTER — Encounter: Payer: Self-pay | Admitting: Internal Medicine

## 2018-04-04 ENCOUNTER — Other Ambulatory Visit: Payer: Self-pay | Admitting: Urology

## 2018-04-04 ENCOUNTER — Inpatient Hospital Stay (HOSPITAL_BASED_OUTPATIENT_CLINIC_OR_DEPARTMENT_OTHER): Payer: 59 | Admitting: Internal Medicine

## 2018-04-04 ENCOUNTER — Ambulatory Visit
Admission: RE | Admit: 2018-04-04 | Discharge: 2018-04-04 | Disposition: A | Discharge status: Home / Self Care | Payer: 59 | Source: Ambulatory Visit | Attending: Radiation Oncology | Admitting: Radiation Oncology

## 2018-04-04 VITALS — BP 109/53 | HR 100 | Temp 99.3°F | Resp 18 | Ht 67.0 in | Wt 125.8 lb

## 2018-04-04 DIAGNOSIS — C7801 Secondary malignant neoplasm of right lung: Secondary | ICD-10-CM | POA: Diagnosis not present

## 2018-04-04 DIAGNOSIS — B370 Candidal stomatitis: Secondary | ICD-10-CM | POA: Diagnosis not present

## 2018-04-04 DIAGNOSIS — C7989 Secondary malignant neoplasm of other specified sites: Secondary | ICD-10-CM

## 2018-04-04 DIAGNOSIS — C801 Malignant (primary) neoplasm, unspecified: Secondary | ICD-10-CM | POA: Diagnosis not present

## 2018-04-04 DIAGNOSIS — Z5111 Encounter for antineoplastic chemotherapy: Secondary | ICD-10-CM

## 2018-04-04 DIAGNOSIS — R918 Other nonspecific abnormal finding of lung field: Secondary | ICD-10-CM

## 2018-04-04 DIAGNOSIS — C7951 Secondary malignant neoplasm of bone: Secondary | ICD-10-CM

## 2018-04-04 DIAGNOSIS — Z7189 Other specified counseling: Secondary | ICD-10-CM | POA: Insufficient documentation

## 2018-04-04 DIAGNOSIS — G893 Neoplasm related pain (acute) (chronic): Secondary | ICD-10-CM | POA: Diagnosis not present

## 2018-04-04 DIAGNOSIS — Z510 Encounter for antineoplastic radiation therapy: Secondary | ICD-10-CM | POA: Diagnosis not present

## 2018-04-04 DIAGNOSIS — Z79899 Other long term (current) drug therapy: Secondary | ICD-10-CM | POA: Diagnosis not present

## 2018-04-04 DIAGNOSIS — C3411 Malignant neoplasm of upper lobe, right bronchus or lung: Secondary | ICD-10-CM | POA: Diagnosis not present

## 2018-04-04 DIAGNOSIS — Z5112 Encounter for antineoplastic immunotherapy: Secondary | ICD-10-CM | POA: Diagnosis not present

## 2018-04-04 DIAGNOSIS — C7981 Secondary malignant neoplasm of breast: Secondary | ICD-10-CM | POA: Diagnosis not present

## 2018-04-04 MED ORDER — CYANOCOBALAMIN 1000 MCG/ML IJ SOLN
INTRAMUSCULAR | Status: AC
  Filled 2018-04-04: qty 1

## 2018-04-04 MED ORDER — MORPHINE SULFATE ER 30 MG PO TBCR: 30.0000 mg | EXTENDED_RELEASE_TABLET | Freq: Two times a day (BID) | ORAL | 0 refills | Status: DC

## 2018-04-04 MED ORDER — CYANOCOBALAMIN 1000 MCG/ML IJ SOLN
1000.0000 ug | Freq: Once | INTRAMUSCULAR | Status: AC
  Administered 2018-04-04: 1000 ug via INTRAMUSCULAR

## 2018-04-04 MED ORDER — FLUCONAZOLE 200 MG PO TABS: 200.0000 mg | ORAL_TABLET | Freq: Every day | ORAL | 0 refills | Status: DC

## 2018-04-04 MED ORDER — FLUCONAZOLE 100 MG PO TABS: 100.0000 mg | ORAL_TABLET | Freq: Every day | ORAL | 0 refills | Status: DC

## 2018-04-04 MED ORDER — OXYCODONE-ACETAMINOPHEN 5-325 MG PO TABS: 1.0000 | ORAL_TABLET | Freq: Four times a day (QID) | ORAL | 0 refills | Status: DC | PRN

## 2018-04-04 MED ORDER — FOLIC ACID 1 MG PO TABS: 1.0000 mg | ORAL_TABLET | Freq: Every day | ORAL | 4 refills | Status: DC

## 2018-04-04 MED FILL — FOLIC ACID 1 MG TABS: 1 | 30 days supply | Qty: 30 | Fill #0

## 2018-04-04 MED FILL — MORPHINE SULF ER 30 MG TAB: 30 | 30 days supply | Qty: 60 | Fill #0

## 2018-04-04 MED FILL — OXYCODONE-ACETAMINOPHEN 5-3: 5-325 | 15 days supply | Qty: 60 | Fill #0

## 2018-04-04 MED FILL — FLUCONAZOLE 200 MG TAB: 200 | 10 days supply | Qty: 10 | Fill #0

## 2018-04-04 NOTE — Progress Notes (Signed)
Patient presented to the nursing clinic concerned the thrush she was prescribed Nystatin for is worse. Assessed the patient and diffuse thrush was noted to be worse than Monday. Reports findings to [employer], PA-C. She plans to escribe Diflucan to [facility]. Patient instructed to pick this up today following her appointment with Dr. Julien Nordmann. Side note: Patient reports nausea resolved on its own and she hasn't had to fill the compazine also prescribed on Monday.

## 2018-04-04 NOTE — Progress Notes (Signed)
START ON PATHWAY REGIMEN - Non-Small Cell Lung     A cycle is every 21 days:     Pembrolizumab      Pemetrexed      Carboplatin   **Always confirm dose/schedule in your pharmacy ordering system**  Patient Characteristics: Stage IV Metastatic, Nonsquamous, Initial Chemotherapy/Immunotherapy, PS = 0, 1, ALK Translocation Negative/Unknown and EGFR Mutation Negative/Non-Sensitizing/Unknown, PD-L1 Expression Positive  ? 50% (TPS) and Immunotherapy Candidate AJCC T Category: T3 Current Disease Status: Distant Metastases AJCC N Category: N2 AJCC M Category: M1c AJCC 8 Stage Grouping: IVB Histology: Nonsquamous Cell ROS1 Rearrangement Status: Awaiting Test Results T790M Mutation Status: Not Applicable - EGFR Mutation Negative/Unknown Other Mutations/Biomarkers: No Other Actionable Mutations NTRK Gene Fusion Status: Awaiting Test Results PD-L1 Expression Status: PD-L1 Positive ? 50% (TPS) Chemotherapy/Immunotherapy LOT: Initial Chemotherapy/Immunotherapy Molecular Targeted Therapy: Not Appropriate ALK Translocation Status: Awaiting Test Results EGFR Mutation Status: Awaiting Test Results BRAF V600E Mutation Status: Awaiting Test Results Performance Status: PS = 0, 1 Immunotherapy Candidate Status: Candidate for Immunotherapy Intent of Therapy: Non-Curative / Palliative Intent, Discussed with Patient

## 2018-04-04 NOTE — Progress Notes (Signed)
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  Metastatic sarcoma cannot be also excluded at this point.  Molecular studies are still pending. PDL 1 expression 99%  PRIOR THERAPY:Palliative radiotherapy to the neck mass and hip lesion under the care of Dr. Tammi Klippel   CURRENT THERAPY: Systemic chemotherapy with carboplatin for AUC of 5, Alimta 500 mg/M2 and Keytruda 200 mg IV every 3 weeks.  First dose expected April 10, 2018 if the patient has no actionable mutations on the pending molecular studies.  INTERVAL HISTORY: Rebecca Lucas 62 y.o. female returns to the clinic today for follow-up visit accompanied by her husband.  The patient is feeling better today with less pain in the neck area.  She continues to have pain in the right hip area.  She also has some right-sided chest pain.  She is currently on fentanyl patch 25 mcg/4 every 3 days but she does not think it is she continues to take some Tylenol and ibuprofen on a daily basis.  Helping her much with her pain management.  She denied having any shortness breath, cough or hemoptysis.  She lost few pounds since her last visit.  She denied having any nausea, vomiting, diarrhea or constipation.  She has lack of taste and she developed oral thrush.  She denied having any fever or chills.  She has no headache or visual changes.  She recently completed a course of antibiotic with Levaquin.  She was seen for a second opinion at [facility] by Dr. Durenda Hurt and review of the pathology slides was also consistent with poorly differentiated neoplasm but the pathologist  at [facility] for non-small cell carcinoma.  The patient has molecular studies pending at [facility].  She also had blood test sent to [facility].  She had PDL 1 expression performed and it was reported to me today that the PDL 1 expression was 99%.  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  The patient is here today for evaluation and discussion of her treatment options based on the new findings.  She is very anxious to start treatment as soon as possible.   MEDICAL HISTORY:History reviewed. No pertinent past medical history.  ALLERGIES:  has No Known Allergies.  MEDICATIONS:  Current Outpatient Medications  Medication Sig Dispense Refill  . acetaminophen (TYLENOL) 500 MG tablet Take 1,000 mg by mouth every 6 (six) hours as needed for mild pain.     Marland Kitchen alendronate (FOSAMAX) 70 MG tablet Take 70 mg by mouth once a week. Wednesday  3  . butalbital-aspirin-caffeine (FIORINAL) 50-325-40 MG capsule Take 1 capsule by mouth every 6 (six) hours as needed for migraine.   1  . Calcium Carbonate-Vitamin D (CALCIUM-D PO) Take 1 tablet by mouth 2 (two) times daily.    . fluconazole (DIFLUCAN) 200 MG tablet Take 1 tablet (200 mg total) by mouth daily. 10 tablet 0  . folic acid (FOLVITE) 1 MG tablet Take 1 tablet (1 mg total) by mouth daily. 30 tablet 4  . ibuprofen (ADVIL,MOTRIN) 200 MG tablet Take 600  mg by mouth every 6 (six) hours as needed for mild pain.     Marland Kitchen LUTEIN PO Take 1 tablet by mouth daily.    Marland Kitchen morphine (MS CONTIN) 30 MG 12 hr tablet Take 1 tablet (30 mg total) by mouth every 12 (twelve) hours. 60 tablet 0  . nystatin (MYCOSTATIN) 100000 UNIT/ML suspension Take 5 mLs (500,000 Units total) by mouth 4 (four) times daily. 60 mL 1  . oxyCODONE-acetaminophen (PERCOCET/ROXICET) 5-325 MG tablet Take 1 tablet by mouth every 6 (six) hours as needed for severe pain. 60 tablet 0  . prochlorperazine (COMPAZINE) 10  MG tablet Take 1 tablet (10 mg total) by mouth every 6 (six) hours as needed for nausea or vomiting. 30 tablet 0  . traMADol (ULTRAM) 50 MG tablet Take 50 mg by mouth 4 (four) times daily as needed (pain).   0  . zolpidem (AMBIEN) 10 MG tablet Take 1 tablet (10 mg total) by mouth at bedtime as needed for sleep. 30 tablet 0   No current facility-administered medications for this visit.     SURGICAL HISTORY: History reviewed. No pertinent surgical history.  REVIEW OF SYSTEMS:  Constitutional: positive for anorexia, fatigue and weight loss Eyes: negative Ears, nose, mouth, throat, and face: positive for sore mouth Respiratory: positive for pleurisy/chest pain Cardiovascular: negative Gastrointestinal: negative Genitourinary:negative Integument/breast: negative Hematologic/lymphatic: negative Musculoskeletal:positive for bone pain and neck pain Neurological: negative Behavioral/Psych: negative Endocrine: negative Allergic/Immunologic: negative   PHYSICAL EXAMINATION: General appearance: alert, cooperative, fatigued and no distress Head: Normocephalic, without obvious abnormality, atraumatic Neck: no adenopathy, no JVD, supple, symmetrical, trachea midline and thyroid not enlarged, symmetric, no tenderness/mass/nodules Lymph nodes: Cervical, supraclavicular, and axillary nodes normal. Resp: clear to auscultation bilaterally Back: symmetric, no curvature. ROM normal. No CVA tenderness. Cardio: regular rate and rhythm, S1, S2 normal, no murmur, click, rub or gallop GI: soft, non-tender; bowel sounds normal; no masses,  no organomegaly Extremities: extremities normal, atraumatic, no cyanosis or edema Neurologic: Alert and oriented X 3, normal strength and tone. Normal symmetric reflexes. Normal coordination and gait  ECOG PERFORMANCE STATUS: 1 - Symptomatic but completely ambulatory  Blood pressure (!) 109/53, pulse 100, temperature 99.3 F (37.4 C), temperature source Oral, resp. rate  18, height 5\' 7"  (1.702 m), weight 125 lb 12.8 oz (57.1 kg), SpO2 99 %.  LABORATORY DATA: Lab Results  Component Value Date   WBC 17.7 (H) 03/23/2018   HGB 10.4 (L) 03/23/2018   HCT 32.1 (L) 03/23/2018   MCV 85.6 03/23/2018   PLT 559 (H) 03/23/2018      Chemistry      Component Value Date/Time   NA 135 03/23/2018 1927   K 4.3 03/23/2018 1927   CL 99 03/23/2018 1927   CO2 27 03/23/2018 1927   BUN 12 03/23/2018 1927   CREATININE 0.83 03/23/2018 1927   CREATININE 0.75 03/06/2018 1128      Component Value Date/Time   CALCIUM 8.9 03/23/2018 1927   ALKPHOS 93 03/06/2018 1128   AST 33 03/06/2018 1128   ALT 49 (H) 03/06/2018 1128   BILITOT 0.2 (L) 03/06/2018 1128       RADIOGRAPHIC STUDIES: Dg Chest 2 View  Result Date: 03/23/2018 CLINICAL DATA:  Patient BIB husband, states today was second round of radiation to neck mass. States fever developed after that time. Pt had lung biopsy on Thursday. Seen on 7/7 for cough. Reports taking abx as prescribed. States cough has decreased. EXAM: CHEST - 2 VIEW COMPARISON:  03/22/2018 and older  exams. FINDINGS: Right upper lobe mass is without significant change from the previous day's chest radiograph. Minor atelectasis at the right lung base. Lungs otherwise clear. Cardiac silhouette is normal in size. No mediastinal or hilar masses. No pneumothorax. Skeletal structures are intact. IMPRESSION: 1. No acute cardiopulmonary disease. 2. Large right upper lobe mass unchanged from the previous day's study. 3. Minor right lung base atelectasis has developed since the prior study. No other change. Electronically Signed   By: Lajean Manes M.D.   On: 03/23/2018 21:06   Dg Chest 2 View  Result Date: 03/18/2018 CLINICAL DATA:  62 y/o  F; productive cough and fever for 2-3 days. EXAM: CHEST - 2 VIEW COMPARISON:  03/05/2018 PET-CT. FINDINGS: Normal cardiac silhouette. Aortic atherosclerosis with calcification. Stable right upper lobe mass better characterized  on prior PET-CT. No new consolidation. No pleural effusion or pneumothorax. No acute osseous abnormality is evident. IMPRESSION: 1. Stable right upper lobe mass. 2. No new acute pulmonary process identified. Electronically Signed   By: Kristine Garbe M.D.   On: 03/18/2018 14:56   Ct Angio Chest Pe W And/or Wo Contrast  Result Date: 03/18/2018 CLINICAL DATA:  Fever and productive cough.  Neck mass. EXAM: CT ANGIOGRAPHY CHEST WITH CONTRAST TECHNIQUE: Multidetector CT imaging of the chest was performed using the standard protocol during bolus administration of intravenous contrast. Multiplanar CT image reconstructions and MIPs were obtained to evaluate the vascular anatomy. CONTRAST:  175mL ISOVUE-370 IOPAMIDOL (ISOVUE-370) INJECTION 76% COMPARISON:  PET-CT 03/05/2018. FINDINGS: Cardiovascular: The heart size is normal. No substantial pericardial effusion. No thoracic aortic aneurysm. Atherosclerotic calcification is noted in the wall of the thoracic aorta. No filling defect within the opacified pulmonary arteries to suggest the presence of an acute pulmonary embolus. Mediastinum/Nodes: 16 mm short axis precarinal lymph node evident. There is a 12 mm short axis right paratracheal lymph node. No left hilar lymphadenopathy. 13 mm short axis right hilar lymph node evident. The esophagus has normal imaging features. There is no axillary lymphadenopathy. Lungs/Pleura: The central tracheobronchial airways are patent. Posterior right upper lobe pulmonary mass measures 6.1 x 5.9 cm. This encases and attenuates airways to the posterior right upper lobe. Lesion likely invades through the major fissure into the anterior aspect of the right upper lobe. Subsegmental atelectasis with confluent airspace consolidation noted in the posterior right middle lobe. This right middle lobe finding is new in the interval. Compressive atelectasis noted in the lower lobes bilaterally. Tiny right pleural effusion evident. Upper  Abdomen: 2.5 x 1.9 cm left adrenal nodule with central necrosis is compatible with the patient's known hypermetabolic left adrenal mat. Musculoskeletal: No worrisome lytic or sclerotic osseous abnormality. Review of the MIP images confirms the above findings. IMPRESSION: 1. No CT evidence for acute pulmonary embolus. 2. Large posterior right upper lobe pulmonary mass with likely extension through the major fissure. This is associated with mediastinal and right hilar lymphadenopathy and necrotic left adrenal nodule. Disease characterized on recent PET-CT of 03/05/2018. 3. Posterior right middle lobe collapse/consolidation. This is new since 03/05/2018 and may be atelectasis and/or pneumonia. Electronically Signed   By: Misty Stanley M.D.   On: 03/18/2018 17:23   Mr Jeri Cos VF Contrast  Result Date: 03/09/2018 CLINICAL DATA:  Diffuse metastatic disease of unknown primary, possibly small-cell lung cancer. Staging. EXAM: MRI HEAD WITHOUT AND WITH CONTRAST TECHNIQUE: Multiplanar, multiecho pulse sequences of the brain and surrounding structures were obtained without and with intravenous contrast. CONTRAST:  22mL MULTIHANCE GADOBENATE DIMEGLUMINE 529 MG/ML  IV SOLN COMPARISON:  Cervical spine MRI 02/24/2018. PET-CT 03/05/2018. No prior brain imaging. FINDINGS: Brain: There is no evidence of acute infarct, intracranial hemorrhage, mass, midline shift, or extra-axial fluid collection. Small foci of T2 hyperintensity are scattered throughout the predominantly subcortical cerebral white matter bilaterally, mild-to-moderate for age. No abnormal brain parenchymal or meningeal enhancement suggestive of metastatic disease is identified. A small developmental venous anomaly is incidentally noted in the left parietal lobe. Vascular: Major intracranial vascular flow voids are preserved. Skull and upper cervical spine: Destructive mass involving the C2 posterior elements with dorsal epidural extension as described on the prior  cervical spine MRI. Abnormal marrow signal in the anteroinferior C2 vertebral body as previously seen also concerning for metastasis. Sinuses/Orbits: Unremarkable orbits. Paranasal sinuses and mastoid air cells are clear. Other: 1.5 cm enhancing subcutaneous nodule in the right suboccipital region, hypermetabolic on PET-CT. IMPRESSION: 1. No evidence of intracranial metastatic disease. 2. 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 3. 1.5 cm right suboccipital subcutaneous nodule suspicious for metastasis. 4. Mild-to-moderate cerebral white matter T2 signal changes, nonspecific though most often seen with chronic small vessel ischemia. Electronically Signed   By: Logan Bores M.D.   On: 03/09/2018 09:45   Ct Biopsy  Result Date: 03/22/2018 INDICATION: Right upper lobe hypermetabolic pulmonary mass. Note, patient underwent successful CT and ultrasound-guided biopsy of occipital mass on 03/08/2018 however pathology remains indeterminate. Request has made for CT-guided biopsy of presumed primary malignancy within the right upper lobe. EXAM: CT GUIDED RIGHT UPPER LOBE PULMONARY MASS BIOPSY COMPARISON:  PET-CT - 03/05/2018; chest CT - 03/18/2018 MEDICATIONS: None. ANESTHESIA/SEDATION: Fentanyl 50 mcg IV; Versed 1 mg IV Sedation time: 22 minutes; The patient was continuously monitored during the procedure by the interventional radiology nurse under my direct supervision. CONTRAST:  None COMPLICATIONS: None immediate. PROCEDURE: Informed consent was obtained from the patient following an explanation of the procedure, risks, benefits and alternatives. The patient understands,agrees and consents for the procedure. All questions were addressed. A time out was performed prior to the initiation of the procedure. The patient was positioned left lateral decubitus, slightly LPO on the CT table and a limited chest CT was performed for procedural planning demonstrating unchanged size and appearance of the  approximately 6.3 x 5.7 cm hypermetabolic right upper lobe pulmonary mass (image 20, series 3). The operative site was prepped and draped in the usual sterile fashion. Under sterile conditions and local anesthesia, a 17 gauge coaxial needle was advanced into the peripheral aspect of the nodule. Positioning was confirmed with intermittent CT fluoroscopy and followed by the acquisition of 6 core needle biopsy samples with an 18 gauge core needle biopsy device. The coaxial needle was removed following deployment of a [employer] plug and superficial hemostasis was achieved with manual compression. Limited post procedural chest CT was negative for pneumothorax or additional complication. A dressing was placed. The patient tolerated the procedure well without immediate postprocedural complication. The patient was escorted to have an upright chest radiograph. IMPRESSION: Technically successful CT guided core needle core biopsy of hypermetabolic right upper lobe pulmonary mass. Electronically Signed   By: Sandi Mariscal M.D.   On: 03/22/2018 17:18   Ct Biopsy  Result Date: 03/08/2018 INDICATION: Concern for metastatic lung cancer. Please perform image guided biopsy hypermetabolic mass within the paraspinal musculature posterior to the superior/mid aspect of the cervical spine for tissue diagnostic purposes. EXAM: CT AND ULTRASOUND-GUIDED BIOPSY HYPERMETABOLIC MASS WITHIN THE PARASPINAL MUSCULATURE OF THE CERVICAL SPINE COMPARISON:  PET-CT - 03/05/2018;  cervical spine MRI - 02/14/2018 MEDICATIONS: None. ANESTHESIA/SEDATION: Fentanyl 100 mcg IV; Versed 2 mg IV Sedation time: 19 minutes; The patient was continuously monitored during the procedure by the interventional radiology nurse under my direct supervision. CONTRAST:  None. COMPLICATIONS: None immediate. PROCEDURE: Informed consent was obtained from the patient following an explanation of the procedure, risks, benefits and alternatives. A time out was performed prior to  the initiation of the procedure. The patient was positioned prone on the CT table and a limited CT was performed for procedural planning demonstrating unchanged appearance of the partially calcified approximately 5.8 x 3.4 cm mass centered within the paraspinal musculatures posterior to the superior/mid cervical spine (image 22, series 2). Table position was marked and the mass was identified sonographically. The procedure was planned. The operative site was prepped and draped in the usual sterile fashion. Under direct ultrasound guidance, appropriate trajectory was confirmed with a 22 gauge spinal needle after the adjacent tissues were anesthetized with 1% Lidocaine with epinephrine. Appropriate positioning was confirmed with a limited CT scan. Next, under direct ultrasound guidance, a 17 gauge coaxial needle was advanced into the peripheral aspect of the mass. Appropriate positioning was confirmed and 6 core needle biopsy samples were obtained with an 18 gauge core needle biopsy device under direct ultrasound guidance. Multiple ultrasound images were saved for procedural documentation purposes. The co-axial needle was removed and hemostasis was achieved with manual compression. A limited postprocedural CT was negative for hemorrhage or additional complication. A dressing was placed. The patient tolerated the procedure well without immediate postprocedural complication. IMPRESSION: Technically successful ultrasound and CT guided core needle biopsy of indeterminate hypermetabolic mass within the paraspinal musculatures about the cranial/mid aspect of the posterior cervical spine. Electronically Signed   By: Sandi Mariscal M.D.   On: 03/08/2018 13:16   Dg Chest Port 1 View  Result Date: 03/22/2018 CLINICAL DATA:  Right lung biopsy from mass EXAM: PORTABLE CHEST 1 VIEW COMPARISON:  03/18/2018 CXR, same day CT chest for lung biopsy FINDINGS: No pneumothorax status post biopsy of right upper lobe mass. Heart and  mediastinal contours are stable. Aortic atherosclerosis noted at the arch without aneurysmal dilatation. No effusion. No aggressive osseous abnormality. IMPRESSION: No pneumothorax status post biopsy of right upper lobe mass. Electronically Signed   By: Ashley Royalty M.D.   On: 03/22/2018 16:00    ASSESSMENT AND PLAN: This is a very pleasant 62 years old white female with metastatic poorly differentiated malignant neoplasm of unknown primary at this point but according to the pathologist at the cancer center it is likely poorly differentiated non-small cell carcinoma of lung primary. 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 PDL 1 expression was 99%. The molecular studies by [facility] as well as [facility] are still pending. I had a lengthy discussion with the patient and her husband today about her current condition and treatment options.  They are anxious to start some form of treatment soon.  They understand that she has incurable condition and all the treatment will be of palliative nature. I explained to the patient that if she has a target actionable mutation this would be the best option for treatment of her condition.  I am still waiting for the molecular studies to be reported in the next few days. I discussed with the patient alternative treatment options including systemic chemotherapy with carboplatin for AUC of 5, Alimta 500 mg/M2 and Keytruda 200 mg IV every 3  weeks. I had a lengthy discussion with the patient and her husband about the different form of treatment as well as their mechanism of action and adverse effects. I reviewed the side effects of the chemotherapy and immunotherapy with the patient including but not limited to alopecia, myelosuppression, nausea and vomiting, peripheral neuropathy, liver or renal dysfunction as well as immunotherapy adverse effect including but not limited to immunotherapy mediated skin rash, diarrhea, inflammation  of the lung, kidney, liver, thyroid or other endocrine dysfunction including type 1 diabetes mellitus. Will arrange for the patient to have a chemotherapy education class before the first dose of her treatment.  She will also receive vitamin B12 injection today and I will start her on folic acid 1 mg p.o. daily. The patient and her husband are in agreement with the current plan and she is expected to start the first cycle of her systemic chemotherapy and immunotherapy next week if she has no actionable mutations. They also understand that this could be also sarcoma but we are treating her with the chemotherapy regimen for lung cancer based on the current available information and the pathology report from [facility]. For pain management she would like to discontinue treatment with fentanyl patch and to consider other form of treatment.  I will start the patient on MS Contin 30 mg p.o. every 12 hours as a long-acting regimen in addition to Percocet 5/325 mg p.o. every 6 hours as needed for breakthrough pain. She will also continue with her palliative radiotherapy to the hip a be short course of nd I would recommend for the patient to receive palliative radiotherapy to the right upper lobe lung mass. For the oral thrush, I will start the patient on Diflucan 200 mg p.o. daily for 10 days. The patient will come back for follow-up visit next week with the start of the first cycle of her treatment. She was advised to call immediately if she has any concerning symptoms in the interval. .The patient voices understanding of current disease status and treatment options and is in agreement with the current care plan.  All questions were answered. The patient knows to call the clinic with any problems, questions or concerns. We can certainly see the patient much sooner if necessary.  I spent 45 minutes counseling the patient face to face. The total time spent in the appointment was 60 minutes.  Disclaimer:  This note was dictated with voice recognition software. Similar sounding words can inadvertently be transcribed and may not be corrected upon review.

## 2018-04-05 ENCOUNTER — Telehealth: Payer: Self-pay | Admitting: Internal Medicine

## 2018-04-05 ENCOUNTER — Ambulatory Visit
Admission: RE | Admit: 2018-04-05 | Discharge: 2018-04-05 | Disposition: A | Discharge status: Home / Self Care | Payer: 59 | Source: Ambulatory Visit | Attending: Radiation Oncology | Admitting: Radiation Oncology

## 2018-04-05 ENCOUNTER — Encounter: Payer: Self-pay | Admitting: *Deleted

## 2018-04-05 ENCOUNTER — Ambulatory Visit: Payer: 59 | Admitting: Radiation Oncology

## 2018-04-05 DIAGNOSIS — C3490 Malignant neoplasm of unspecified part of unspecified bronchus or lung: Secondary | ICD-10-CM | POA: Diagnosis not present

## 2018-04-05 DIAGNOSIS — Z510 Encounter for antineoplastic radiation therapy: Secondary | ICD-10-CM | POA: Diagnosis not present

## 2018-04-05 DIAGNOSIS — C3411 Malignant neoplasm of upper lobe, right bronchus or lung: Secondary | ICD-10-CM

## 2018-04-05 DIAGNOSIS — C7951 Secondary malignant neoplasm of bone: Secondary | ICD-10-CM | POA: Diagnosis not present

## 2018-04-05 DIAGNOSIS — C7981 Secondary malignant neoplasm of breast: Secondary | ICD-10-CM | POA: Diagnosis not present

## 2018-04-05 NOTE — Telephone Encounter (Signed)
Scheduled appt per 7/24 los - pt aware - unable to schedule first treatment due to cap - logged - will give patient an updated schedule tomorrow.

## 2018-04-05 NOTE — Progress Notes (Signed)
Oncology Nurse Navigator Documentation  Oncology Nurse Navigator Flowsheets 04/05/2018  Navigator Location [facility]  Navigator Encounter Type Other/per Dr. Julien Nordmann, I followed up on Ms. G[facility] results.  I noticed they are on hold. I contacted [facility] and they will contact me back with an update.  Dr. Julien Nordmann aware.   Treatment Phase Treatment  Barriers/Navigation Needs Coordination of Care  Interventions Coordination of Care  Coordination of Care Other  Acuity Level 2  Time Spent with Patient 15

## 2018-04-06 ENCOUNTER — Inpatient Hospital Stay: Payer: 59

## 2018-04-06 ENCOUNTER — Ambulatory Visit
Admission: RE | Admit: 2018-04-06 | Discharge: 2018-04-06 | Disposition: A | Discharge status: Home / Self Care | Payer: 59 | Source: Ambulatory Visit | Attending: Radiation Oncology | Admitting: Radiation Oncology

## 2018-04-06 ENCOUNTER — Ambulatory Visit: Payer: 59 | Admitting: Radiation Oncology

## 2018-04-06 DIAGNOSIS — C3411 Malignant neoplasm of upper lobe, right bronchus or lung: Secondary | ICD-10-CM | POA: Diagnosis not present

## 2018-04-06 DIAGNOSIS — C7981 Secondary malignant neoplasm of breast: Secondary | ICD-10-CM | POA: Diagnosis not present

## 2018-04-06 DIAGNOSIS — Z510 Encounter for antineoplastic radiation therapy: Secondary | ICD-10-CM | POA: Diagnosis not present

## 2018-04-07 NOTE — Progress Notes (Signed)
  Radiation Oncology         [phone] ________________________________  Name: Rebecca Lucas MRN: [id number]  Date: 04/05/2018  DOB: 1956/07/28  SIMULATION AND TREATMENT PLANNING NOTE    ICD-10-CM   1. Primary cancer of right upper lobe of lung (HCC) C34.11     DIAGNOSIS:  62 y.o. woman with right upper lung cancer  NARRATIVE:  The patient was brought to the [facility].  Identity was confirmed.  All relevant records and images related to the planned course of therapy were reviewed.  The patient freely provided informed written consent to proceed with treatment after reviewing the details related to the planned course of therapy. The consent form was witnessed and verified by the simulation staff.  Then, the patient was set-up in a stable reproducible  supine position for radiation therapy.  CT images were obtained.  Surface markings were placed.  The CT images were loaded into the planning software.  Then the target and avoidance structures were contoured.  Treatment planning then occurred.  The radiation prescription was entered and confirmed.  Then, I designed and supervised the construction of a total of 6 medically necessary complex treatment devices, including a BodyFix immobilization mold custom fitted to the patient along with about 5 multileaf collimators pending the planconformally shaped radiation around the treatment target while shielding critical structures such as the heart and spinal cord maximally.  I have requested : 3D Simulation  I have requested a DVH of the following structures: Left lung, right lung, spinal cord, heart, esophagus, and target.  I have ordered:Nutrition Consult  PLAN:  The patient will receive 30 Gy in 10 fractions.  ________________________________  Rebecca Lucas, M.D.

## 2018-04-08 DIAGNOSIS — C7951 Secondary malignant neoplasm of bone: Secondary | ICD-10-CM | POA: Diagnosis not present

## 2018-04-08 DIAGNOSIS — Z510 Encounter for antineoplastic radiation therapy: Secondary | ICD-10-CM | POA: Diagnosis not present

## 2018-04-08 DIAGNOSIS — C7981 Secondary malignant neoplasm of breast: Secondary | ICD-10-CM | POA: Diagnosis not present

## 2018-04-08 DIAGNOSIS — C3411 Malignant neoplasm of upper lobe, right bronchus or lung: Secondary | ICD-10-CM | POA: Diagnosis not present

## 2018-04-09 ENCOUNTER — Encounter: Payer: Self-pay | Admitting: *Deleted

## 2018-04-09 ENCOUNTER — Telehealth: Payer: Self-pay | Admitting: Internal Medicine

## 2018-04-09 ENCOUNTER — Ambulatory Visit
Admission: RE | Admit: 2018-04-09 | Discharge: 2018-04-09 | Disposition: A | Discharge status: Home / Self Care | Payer: 59 | Source: Ambulatory Visit | Attending: Radiation Oncology | Admitting: Radiation Oncology

## 2018-04-09 DIAGNOSIS — Z510 Encounter for antineoplastic radiation therapy: Secondary | ICD-10-CM | POA: Diagnosis not present

## 2018-04-09 DIAGNOSIS — C799 Secondary malignant neoplasm of unspecified site: Secondary | ICD-10-CM | POA: Diagnosis not present

## 2018-04-09 DIAGNOSIS — C7951 Secondary malignant neoplasm of bone: Secondary | ICD-10-CM | POA: Diagnosis not present

## 2018-04-09 DIAGNOSIS — C3411 Malignant neoplasm of upper lobe, right bronchus or lung: Secondary | ICD-10-CM | POA: Diagnosis not present

## 2018-04-09 DIAGNOSIS — C7981 Secondary malignant neoplasm of breast: Secondary | ICD-10-CM | POA: Diagnosis not present

## 2018-04-09 NOTE — Telephone Encounter (Signed)
Spoke with patient regarding  First treatment on 7/31 - pt is aware of appt date and time and aware of labs the day before,.

## 2018-04-09 NOTE — Progress Notes (Signed)
Oncology Nurse Navigator Documentation  Oncology Nurse Navigator Flowsheets 04/09/2018  Navigator Location [facility]  Navigator Encounter Type Other/per Dr. Julien Nordmann, I called [facility] to check on results.  They are still in progress but due to the length of time it is taking, they will do an urgent follow up and update me. I will updated Dr. Julien Nordmann.   Treatment Phase Treatment  Barriers/Navigation Needs Coordination of Care  Interventions Coordination of Care  Coordination of Care Other  Acuity Level 2  Time Spent with Patient 15

## 2018-04-10 ENCOUNTER — Encounter (HOSPITAL_COMMUNITY): Payer: Self-pay | Admitting: Hematology

## 2018-04-10 ENCOUNTER — Encounter: Payer: Self-pay | Admitting: *Deleted

## 2018-04-10 ENCOUNTER — Inpatient Hospital Stay: Payer: 59

## 2018-04-10 ENCOUNTER — Ambulatory Visit
Admission: RE | Admit: 2018-04-10 | Discharge: 2018-04-10 | Disposition: A | Discharge status: Home / Self Care | Payer: 59 | Source: Ambulatory Visit | Attending: Radiation Oncology | Admitting: Radiation Oncology

## 2018-04-10 DIAGNOSIS — C7951 Secondary malignant neoplasm of bone: Secondary | ICD-10-CM

## 2018-04-10 DIAGNOSIS — G893 Neoplasm related pain (acute) (chronic): Secondary | ICD-10-CM | POA: Diagnosis not present

## 2018-04-10 DIAGNOSIS — C3411 Malignant neoplasm of upper lobe, right bronchus or lung: Secondary | ICD-10-CM | POA: Diagnosis not present

## 2018-04-10 DIAGNOSIS — C801 Malignant (primary) neoplasm, unspecified: Secondary | ICD-10-CM | POA: Diagnosis not present

## 2018-04-10 DIAGNOSIS — C7989 Secondary malignant neoplasm of other specified sites: Secondary | ICD-10-CM | POA: Diagnosis not present

## 2018-04-10 DIAGNOSIS — Z5111 Encounter for antineoplastic chemotherapy: Secondary | ICD-10-CM | POA: Diagnosis not present

## 2018-04-10 DIAGNOSIS — C7801 Secondary malignant neoplasm of right lung: Secondary | ICD-10-CM | POA: Diagnosis not present

## 2018-04-10 DIAGNOSIS — Z510 Encounter for antineoplastic radiation therapy: Secondary | ICD-10-CM | POA: Diagnosis not present

## 2018-04-10 DIAGNOSIS — Z5112 Encounter for antineoplastic immunotherapy: Secondary | ICD-10-CM | POA: Diagnosis not present

## 2018-04-10 DIAGNOSIS — C7981 Secondary malignant neoplasm of breast: Secondary | ICD-10-CM | POA: Diagnosis not present

## 2018-04-10 DIAGNOSIS — Z79899 Other long term (current) drug therapy: Secondary | ICD-10-CM | POA: Diagnosis not present

## 2018-04-10 LAB — CBC WITH DIFFERENTIAL (CANCER CENTER ONLY)
Basophils Absolute: 0 10*3/uL (ref 0.0–0.1)
Basophils Relative: 0 %
EOS ABS: 0.6 10*3/uL — AB (ref 0.0–0.5)
Eosinophils Relative: 5 %
HCT: 29.1 % — ABNORMAL LOW (ref 34.8–46.6)
HEMOGLOBIN: 9.2 g/dL — AB (ref 11.6–15.9)
LYMPHS ABS: 0.5 10*3/uL — AB (ref 0.9–3.3)
LYMPHS PCT: 4 %
MCH: 26.8 pg (ref 25.1–34.0)
MCHC: 31.6 g/dL (ref 31.5–36.0)
MCV: 84.8 fL (ref 79.5–101.0)
MONOS PCT: 6 %
Monocytes Absolute: 0.6 10*3/uL (ref 0.1–0.9)
Neutro Abs: 8.8 10*3/uL — ABNORMAL HIGH (ref 1.5–6.5)
Neutrophils Relative %: 85 %
Platelet Count: 379 10*3/uL (ref 145–400)
RBC: 3.43 MIL/uL — AB (ref 3.70–5.45)
RDW: 14.7 % — ABNORMAL HIGH (ref 11.2–14.5)
WBC: 10.5 10*3/uL — AB (ref 3.9–10.3)

## 2018-04-10 LAB — CMP (CANCER CENTER ONLY)
ALT: 66 U/L — AB (ref 0–44)
AST: 59 U/L — ABNORMAL HIGH (ref 15–41)
Albumin: 2.5 g/dL — ABNORMAL LOW (ref 3.5–5.0)
Alkaline Phosphatase: 138 U/L — ABNORMAL HIGH (ref 38–126)
Anion gap: 8 (ref 5–15)
BUN: 15 mg/dL (ref 6–20)
CHLORIDE: 95 mmol/L — AB (ref 98–111)
CO2: 29 mmol/L (ref 22–32)
CREATININE: 0.66 mg/dL (ref 0.44–1.00)
Calcium: 9.2 mg/dL (ref 8.9–10.3)
GFR, Estimated: >60 mL/min (ref >60)
Glucose, Bld: 102 mg/dL — ABNORMAL HIGH (ref 70–99)
POTASSIUM: 4.8 mmol/L (ref 3.5–5.1)
SODIUM: 132 mmol/L — AB (ref 135–145)
Total Bilirubin: 0.3 mg/dL (ref 0.3–1.2)
Total Protein: 7.3 g/dL (ref 6.5–8.1)

## 2018-04-10 LAB — TSH: TSH: 0.583 u[IU]/mL (ref 0.308–3.960)

## 2018-04-10 NOTE — Progress Notes (Signed)
Oncology Nurse Navigator Documentation  Oncology Nurse Navigator Flowsheets 04/10/2018  Navigator Location [facility]  Navigator Encounter Type Other/reiceved FMLA papers via fax on 04/10/18.  I called and left vm message with HIM dept regarding papers needing to be filled out.   Treatment Phase Treatment  Barriers/Navigation Needs Coordination of Care  Interventions Coordination of Care  Coordination of Care Other  Acuity Level 2  Time Spent with Patient 15

## 2018-04-11 ENCOUNTER — Ambulatory Visit (HOSPITAL_BASED_OUTPATIENT_CLINIC_OR_DEPARTMENT_OTHER): Payer: 59 | Admitting: Medical

## 2018-04-11 ENCOUNTER — Other Ambulatory Visit: Payer: Self-pay | Admitting: Medical

## 2018-04-11 ENCOUNTER — Ambulatory Visit
Admission: RE | Admit: 2018-04-11 | Discharge: 2018-04-11 | Disposition: A | Discharge status: Home / Self Care | Payer: 59 | Source: Ambulatory Visit | Attending: Radiation Oncology | Admitting: Radiation Oncology

## 2018-04-11 ENCOUNTER — Inpatient Hospital Stay: Payer: 59

## 2018-04-11 ENCOUNTER — Ambulatory Visit: Payer: 59

## 2018-04-11 VITALS — BP 113/54 | HR 92 | Temp 99.6°F | Resp 18

## 2018-04-11 DIAGNOSIS — K5903 Drug induced constipation: Secondary | ICD-10-CM | POA: Diagnosis not present

## 2018-04-11 DIAGNOSIS — C7951 Secondary malignant neoplasm of bone: Secondary | ICD-10-CM

## 2018-04-11 DIAGNOSIS — T402X5A Adverse effect of other opioids, initial encounter: Principal | ICD-10-CM

## 2018-04-11 DIAGNOSIS — C7801 Secondary malignant neoplasm of right lung: Secondary | ICD-10-CM | POA: Diagnosis not present

## 2018-04-11 DIAGNOSIS — Z5111 Encounter for antineoplastic chemotherapy: Secondary | ICD-10-CM | POA: Diagnosis not present

## 2018-04-11 DIAGNOSIS — B370 Candidal stomatitis: Secondary | ICD-10-CM

## 2018-04-11 DIAGNOSIS — Z5112 Encounter for antineoplastic immunotherapy: Secondary | ICD-10-CM | POA: Diagnosis not present

## 2018-04-11 DIAGNOSIS — C3411 Malignant neoplasm of upper lobe, right bronchus or lung: Secondary | ICD-10-CM | POA: Diagnosis not present

## 2018-04-11 DIAGNOSIS — Z79899 Other long term (current) drug therapy: Secondary | ICD-10-CM | POA: Diagnosis not present

## 2018-04-11 DIAGNOSIS — C7981 Secondary malignant neoplasm of breast: Secondary | ICD-10-CM | POA: Diagnosis not present

## 2018-04-11 DIAGNOSIS — C801 Malignant (primary) neoplasm, unspecified: Secondary | ICD-10-CM | POA: Diagnosis not present

## 2018-04-11 DIAGNOSIS — Z510 Encounter for antineoplastic radiation therapy: Secondary | ICD-10-CM | POA: Diagnosis not present

## 2018-04-11 DIAGNOSIS — C7989 Secondary malignant neoplasm of other specified sites: Secondary | ICD-10-CM | POA: Diagnosis not present

## 2018-04-11 DIAGNOSIS — R682 Dry mouth, unspecified: Secondary | ICD-10-CM | POA: Diagnosis not present

## 2018-04-11 DIAGNOSIS — G893 Neoplasm related pain (acute) (chronic): Secondary | ICD-10-CM | POA: Diagnosis not present

## 2018-04-11 MED ORDER — FLUCONAZOLE 200 MG PO TABS: 200.0000 mg | ORAL_TABLET | Freq: Every day | ORAL | 0 refills | Status: DC

## 2018-04-11 MED ORDER — LUBIPROSTONE 24 MCG PO CAPS: 24.0000 ug | ORAL_CAPSULE | Freq: Two times a day (BID) | ORAL | 2 refills | Status: DC

## 2018-04-11 MED ORDER — MAGIC MOUTHWASH: 5.0000 mL | Freq: Four times a day (QID) | ORAL | 2 refills | Status: DC | PRN

## 2018-04-11 MED ORDER — SODIUM CHLORIDE 0.9 % IV SOLN
Freq: Once | INTRAVENOUS | Status: AC
  Administered 2018-04-11: 09:00:00 via INTRAVENOUS
  Filled 2018-04-11: qty 250

## 2018-04-11 MED ORDER — SODIUM CHLORIDE 0.9 % IV SOLN
Freq: Once | INTRAVENOUS | Status: AC
  Administered 2018-04-11: 10:00:00 via INTRAVENOUS
  Filled 2018-04-11: qty 5

## 2018-04-11 MED ORDER — PALONOSETRON HCL INJECTION 0.25 MG/5ML
INTRAVENOUS | Status: AC
  Filled 2018-04-11: qty 5

## 2018-04-11 MED ORDER — SODIUM CHLORIDE 0.9 % IV SOLN
800.0000 mg | Freq: Once | INTRAVENOUS | Status: AC
  Administered 2018-04-11: 800 mg via INTRAVENOUS
  Filled 2018-04-11: qty 20

## 2018-04-11 MED ORDER — SODIUM CHLORIDE 0.9 % IV SOLN
450.0000 mg | Freq: Once | INTRAVENOUS | Status: AC
  Administered 2018-04-11: 450 mg via INTRAVENOUS
  Filled 2018-04-11: qty 45

## 2018-04-11 MED ORDER — PALONOSETRON HCL INJECTION 0.25 MG/5ML
0.2500 mg | Freq: Once | INTRAVENOUS | Status: AC
  Administered 2018-04-11: 0.25 mg via INTRAVENOUS

## 2018-04-11 MED ORDER — SODIUM CHLORIDE 0.9 % IV SOLN
200.0000 mg | Freq: Once | INTRAVENOUS | Status: AC
  Administered 2018-04-11: 200 mg via INTRAVENOUS
  Filled 2018-04-11: qty 8

## 2018-04-11 MED FILL — MAGIC MW (NYS,BEN,MAAL): 12 days supply | Qty: 240 | Fill #0

## 2018-04-11 NOTE — Patient Instructions (Signed)
[facility] Discharge Instructions for Patients Receiving Chemotherapy  Today you received the following chemotherapy agents Keytruda, Alimta, Carboplatin  To help prevent nausea and vomiting after your treatment, we encourage you to take your nausea medication as directed by MD   If you develop nausea and vomiting that is not controlled by your nausea medication, call the clinic.   BELOW ARE SYMPTOMS THAT SHOULD BE REPORTED IMMEDIATELY:  *FEVER GREATER THAN 100.5 F  *CHILLS WITH OR WITHOUT FEVER  NAUSEA AND VOMITING THAT IS NOT CONTROLLED WITH YOUR NAUSEA MEDICATION  *UNUSUAL SHORTNESS OF BREATH  *UNUSUAL BRUISING OR BLEEDING  TENDERNESS IN MOUTH AND THROAT WITH OR WITHOUT PRESENCE OF ULCERS  *URINARY PROBLEMS  *BOWEL PROBLEMS  UNUSUAL RASH Items with * indicate a potential emergency and should be followed up as soon as possible.  Feel free to call the clinic should you have any questions or concerns. The clinic phone number is [phone].  Please show the [place] at check-in to the Emergency Department and triage nurse.   Pembrolizumab injection- Keytruda What is this medicine? PEMBROLIZUMAB (pem broe liz ue mab) is a monoclonal antibody. It is used to treat melanoma, head and neck cancer, Hodgkin lymphoma, non-small cell lung cancer, urothelial cancer, stomach cancer, and cancers that have a certain genetic condition. This medicine may be used for other purposes; ask your health care provider or pharmacist if you have questions. COMMON BRAND NAME(S): Keytruda What should I tell my health care provider before I take this medicine? They need to know if you have any of these conditions: -diabetes -immune system problems -inflammatory bowel disease -liver disease -lung or breathing disease -lupus -organ transplant -an unusual or allergic reaction to pembrolizumab, other medicines, foods, dyes, or preservatives -pregnant or trying to get  pregnant -breast-feeding How should I use this medicine? This medicine is for infusion into a vein. It is given by a health care professional in a hospital or clinic setting. A special MedGuide will be given to you before each treatment. Be sure to read this information carefully each time. Talk to your pediatrician regarding the use of this medicine in children. While this drug may be prescribed for selected conditions, precautions do apply. Overdosage: If you think you have taken too much of this medicine contact a poison control center or emergency room at once. NOTE: This medicine is only for you. Do not share this medicine with others. What if I miss a dose? It is important not to miss your dose. Call your doctor or health care professional if you are unable to keep an appointment. What may interact with this medicine? Interactions have not been studied. Give your health care provider a list of all the medicines, herbs, non-prescription drugs, or dietary supplements you use. Also tell them if you smoke, drink alcohol, or use illegal drugs. Some items may interact with your medicine. This list may not describe all possible interactions. Give your health care provider a list of all the medicines, herbs, non-prescription drugs, or dietary supplements you use. Also tell them if you smoke, drink alcohol, or use illegal drugs. Some items may interact with your medicine. What should I watch for while using this medicine? Your condition will be monitored carefully while you are receiving this medicine. You may need blood work done while you are taking this medicine. Do not become pregnant while taking this medicine or for 4 months after stopping it. Women should inform their doctor if they wish to  become pregnant or think they might be pregnant. There is a potential for serious side effects to an unborn child. Talk to your health care professional or pharmacist for more information. Do not breast-feed  an infant while taking this medicine or for 4 months after the last dose. What side effects may I notice from receiving this medicine? Side effects that you should report to your doctor or health care professional as soon as possible: -allergic reactions like skin rash, itching or hives, swelling of the face, lips, or tongue -bloody or black, tarry -breathing problems -changes in vision -chest pain -chills -constipation -cough -dizziness or feeling faint or lightheaded -fast or irregular heartbeat -fever -flushing -hair loss -low blood counts - this medicine may decrease the number of white blood cells, red blood cells and platelets. You may be at increased risk for infections and bleeding. -muscle pain -muscle weakness -persistent headache -signs and symptoms of high blood sugar such as dizziness; dry mouth; dry skin; fruity breath; nausea; stomach pain; increased hunger or thirst; increased urination -signs and symptoms of kidney injury like trouble passing urine or change in the amount of urine -signs and symptoms of liver injury like dark urine, light-colored stools, loss of appetite, nausea, right upper belly pain, yellowing of the eyes or skin -stomach pain -sweating -weight loss Side effects that usually do not require medical attention (report to your doctor or health care professional if they continue or are bothersome): -decreased appetite -diarrhea -tiredness This list may not describe all possible side effects. Call your doctor for medical advice about side effects. You may report side effects to FDA at 1-800-FDA-1088. Where should I keep my medicine? This drug is given in a hospital or clinic and will not be stored at home. NOTE: This sheet is a summary. It may not cover all possible information. If you have questions about this medicine, talk to your doctor, pharmacist, or health care provider.  2018 Elsevier/Gold Standard (2016-06-07 12:29:36)  Pemetrexed  injection- Alimta What is this medicine? PEMETREXED (PEM e TREX ed) is a chemotherapy drug used to treat lung cancers like non-small cell lung cancer and mesothelioma. It may also be used to treat other cancers. This medicine may be used for other purposes; ask your health care provider or pharmacist if you have questions. COMMON BRAND NAME(S): Alimta What should I tell my health care provider before I take this medicine? They need to know if you have any of these conditions: -infection (especially a virus infection such as chickenpox, cold sores, or herpes) -kidney disease -low blood counts, like low white cell, platelet, or red cell counts -lung or breathing disease, like asthma -radiation therapy -an unusual or allergic reaction to pemetrexed, other medicines, foods, dyes, or preservative -pregnant or trying to get pregnant -breast-feeding How should I use this medicine? This drug is given as an infusion into a vein. It is administered in a hospital or clinic by a specially trained health care professional. Talk to your pediatrician regarding the use of this medicine in children. Special care may be needed. Overdosage: If you think you have taken too much of this medicine contact a poison control center or emergency room at once. NOTE: This medicine is only for you. Do not share this medicine with others. What if I miss a dose? It is important not to miss your dose. Call your doctor or health care professional if you are unable to keep an appointment. What may interact with this medicine? This medicine may interact  with the following medications: -Ibuprofen This list may not describe all possible interactions. Give your health care provider a list of all the medicines, herbs, non-prescription drugs, or dietary supplements you use. Also tell them if you smoke, drink alcohol, or use illegal drugs. Some items may interact with your medicine. What should I watch for while using this  medicine? Visit your doctor for checks on your progress. This drug may make you feel generally unwell. This is not uncommon, as chemotherapy can affect healthy cells as well as cancer cells. Report any side effects. Continue your course of treatment even though you feel ill unless your doctor tells you to stop. In some cases, you may be given additional medicines to help with side effects. Follow all directions for their use. Call your doctor or health care professional for advice if you get a fever, chills or sore throat, or other symptoms of a cold or flu. Do not treat yourself. This drug decreases your body's ability to fight infections. Try to avoid being around people who are sick. This medicine may increase your risk to bruise or bleed. Call your doctor or health care professional if you notice any unusual bleeding. Be careful brushing and flossing your teeth or using a toothpick because you may get an infection or bleed more easily. If you have any dental work done, tell your dentist you are receiving this medicine. Avoid taking products that contain aspirin, acetaminophen, ibuprofen, naproxen, or ketoprofen unless instructed by your doctor. These medicines may hide a fever. Call your doctor or health care professional if you get diarrhea or mouth sores. Do not treat yourself. To protect your kidneys, drink water or other fluids as directed while you are taking this medicine. Do not become pregnant while taking this medicine or for 6 months after stopping it. Women should inform their doctor if they wish to become pregnant or think they might be pregnant. Men should not father a child while taking this medicine and for 3 months after stopping it. This may interfere with the ability to father a child. You should talk to your doctor or health care professional if you are concerned about your fertility. There is a potential for serious side effects to an unborn child. Talk to your health care  professional or pharmacist for more information. Do not breast-feed an infant while taking this medicine or for 1 week after stopping it. What side effects may I notice from receiving this medicine? Side effects that you should report to your doctor or health care professional as soon as possible: -allergic reactions like skin rash, itching or hives, swelling of the face, lips, or tongue -breathing problems -redness, blistering, peeling or loosening of the skin, including inside the mouth -signs and symptoms of bleeding such as bloody or black, tarry stools; red or dark-brown urine; spitting up blood or brown material that looks like coffee grounds; red spots on the skin; unusual bruising or bleeding from the eye, gums, or nose -signs and symptoms of infection like fever or chills; cough; sore throat; pain or trouble passing urine -signs and symptoms of kidney injury like trouble passing urine or change in the amount of urine -signs and symptoms of liver injury like dark yellow or brown urine; general ill feeling or flu-like symptoms; light-colored stools; loss of appetite; nausea; right upper belly pain; unusually weak or tired; yellowing of the eyes or skin Side effects that usually do not require medical attention (report to your doctor or health care professional  if they continue or are bothersome): -constipation -dizziness -mouth sores -nausea, vomiting -pain, tingling, numbness in the hands or feet -unusually weak or tired This list may not describe all possible side effects. Call your doctor for medical advice about side effects. You may report side effects to FDA at 1-800-FDA-1088. Where should I keep my medicine? This drug is given in a hospital or clinic and will not be stored at home. NOTE: This sheet is a summary. It may not cover all possible information. If you have questions about this medicine, talk to your doctor, pharmacist, or health care provider.  2018 Elsevier/Gold  Standard (2016-06-28 18:51:46)  Carboplatin injection- Carbo What is this medicine? CARBOPLATIN (KAR boe pla tin) is a chemotherapy drug. It targets fast dividing cells, like cancer cells, and causes these cells to die. This medicine is used to treat ovarian cancer and many other cancers. This medicine may be used for other purposes; ask your health care provider or pharmacist if you have questions. COMMON BRAND NAME(S): Paraplatin What should I tell my health care provider before I take this medicine? They need to know if you have any of these conditions: -blood disorders -hearing problems -kidney disease -recent or ongoing radiation therapy -an unusual or allergic reaction to carboplatin, cisplatin, other chemotherapy, other medicines, foods, dyes, or preservatives -pregnant or trying to get pregnant -breast-feeding How should I use this medicine? This drug is usually given as an infusion into a vein. It is administered in a hospital or clinic by a specially trained health care professional. Talk to your pediatrician regarding the use of this medicine in children. Special care may be needed. Overdosage: If you think you have taken too much of this medicine contact a poison control center or emergency room at once. NOTE: This medicine is only for you. Do not share this medicine with others. What if I miss a dose? It is important not to miss a dose. Call your doctor or health care professional if you are unable to keep an appointment. What may interact with this medicine? -medicines for seizures -medicines to increase blood counts like filgrastim, pegfilgrastim, sargramostim -some antibiotics like amikacin, gentamicin, neomycin, streptomycin, tobramycin -vaccines Talk to your doctor or health care professional before taking any of these medicines: -acetaminophen -aspirin -ibuprofen -ketoprofen -naproxen This list may not describe all possible interactions. Give your health care  provider a list of all the medicines, herbs, non-prescription drugs, or dietary supplements you use. Also tell them if you smoke, drink alcohol, or use illegal drugs. Some items may interact with your medicine. What should I watch for while using this medicine? Your condition will be monitored carefully while you are receiving this medicine. You will need important blood work done while you are taking this medicine. This drug may make you feel generally unwell. This is not uncommon, as chemotherapy can affect healthy cells as well as cancer cells. Report any side effects. Continue your course of treatment even though you feel ill unless your doctor tells you to stop. In some cases, you may be given additional medicines to help with side effects. Follow all directions for their use. Call your doctor or health care professional for advice if you get a fever, chills or sore throat, or other symptoms of a cold or flu. Do not treat yourself. This drug decreases your body's ability to fight infections. Try to avoid being around people who are sick. This medicine may increase your risk to bruise or bleed. Call your doctor or  health care professional if you notice any unusual bleeding. Be careful brushing and flossing your teeth or using a toothpick because you may get an infection or bleed more easily. If you have any dental work done, tell your dentist you are receiving this medicine. Avoid taking products that contain aspirin, acetaminophen, ibuprofen, naproxen, or ketoprofen unless instructed by your doctor. These medicines may hide a fever. Do not become pregnant while taking this medicine. Women should inform their doctor if they wish to become pregnant or think they might be pregnant. There is a potential for serious side effects to an unborn child. Talk to your health care professional or pharmacist for more information. Do not breast-feed an infant while taking this medicine. What side effects may I  notice from receiving this medicine? Side effects that you should report to your doctor or health care professional as soon as possible: -allergic reactions like skin rash, itching or hives, swelling of the face, lips, or tongue -signs of infection - fever or chills, cough, sore throat, pain or difficulty passing urine -signs of decreased platelets or bleeding - bruising, pinpoint red spots on the skin, black, tarry stools, nosebleeds -signs of decreased red blood cells - unusually weak or tired, fainting spells, lightheadedness -breathing problems -changes in hearing -changes in vision -chest pain -high blood pressure -low blood counts - This drug may decrease the number of white blood cells, red blood cells and platelets. You may be at increased risk for infections and bleeding. -nausea and vomiting -pain, swelling, redness or irritation at the injection site -pain, tingling, numbness in the hands or feet -problems with balance, talking, walking -trouble passing urine or change in the amount of urine Side effects that usually do not require medical attention (report to your doctor or health care professional if they continue or are bothersome): -hair loss -loss of appetite -metallic taste in the mouth or changes in taste This list may not describe all possible side effects. Call your doctor for medical advice about side effects. You may report side effects to FDA at 1-800-FDA-1088. Where should I keep my medicine? This drug is given in a hospital or clinic and will not be stored at home. NOTE: This sheet is a summary. It may not cover all possible information. If you have questions about this medicine, talk to your doctor, pharmacist, or health care provider.  2018 Elsevier/Gold Standard (2007-12-04 14:38:05)

## 2018-04-12 ENCOUNTER — Telehealth: Payer: Self-pay

## 2018-04-12 ENCOUNTER — Ambulatory Visit
Admission: RE | Admit: 2018-04-12 | Discharge: 2018-04-12 | Disposition: A | Discharge status: Home / Self Care | Payer: 59 | Source: Ambulatory Visit | Attending: Radiation Oncology | Admitting: Radiation Oncology

## 2018-04-12 DIAGNOSIS — C3411 Malignant neoplasm of upper lobe, right bronchus or lung: Secondary | ICD-10-CM | POA: Diagnosis not present

## 2018-04-12 DIAGNOSIS — Z510 Encounter for antineoplastic radiation therapy: Secondary | ICD-10-CM | POA: Diagnosis not present

## 2018-04-12 DIAGNOSIS — C7951 Secondary malignant neoplasm of bone: Secondary | ICD-10-CM | POA: Diagnosis not present

## 2018-04-12 DIAGNOSIS — Z760 Encounter for issue of repeat prescription: Secondary | ICD-10-CM | POA: Diagnosis not present

## 2018-04-12 MED FILL — FLUCONAZOLE 200 MG TAB: 200 | 5 days supply | Qty: 5 | Fill #0

## 2018-04-12 NOTE — Telephone Encounter (Signed)
Pt called with insurance issue regarding medication for opioid induced constipation written be Sandi Mealy, PA yesterday. Insurance would like to try linzess instead. Discussed with PA who would like to try a prior authorization instead of ordering linzess as this medication is not for the treatment of opioid-induced constipation. Pt verbalized understanding.

## 2018-04-12 NOTE — Progress Notes (Signed)
FMLA for spouse, [name], successfully faxed to [employer] at [phone]. Mailed copy to patient address on file.

## 2018-04-13 ENCOUNTER — Ambulatory Visit
Admission: RE | Admit: 2018-04-13 | Discharge: 2018-04-13 | Disposition: A | Discharge status: Home / Self Care | Payer: 59 | Source: Ambulatory Visit | Attending: Radiation Oncology | Admitting: Radiation Oncology

## 2018-04-13 ENCOUNTER — Other Ambulatory Visit: Payer: Self-pay | Admitting: Medical

## 2018-04-13 DIAGNOSIS — C3411 Malignant neoplasm of upper lobe, right bronchus or lung: Secondary | ICD-10-CM | POA: Diagnosis not present

## 2018-04-13 DIAGNOSIS — Z510 Encounter for antineoplastic radiation therapy: Secondary | ICD-10-CM | POA: Diagnosis not present

## 2018-04-13 DIAGNOSIS — C7951 Secondary malignant neoplasm of bone: Secondary | ICD-10-CM | POA: Diagnosis not present

## 2018-04-13 MED ORDER — LINACLOTIDE 72 MCG PO CAPS: 72.0000 ug | ORAL_CAPSULE | Freq: Every day | ORAL | 2 refills | Status: DC

## 2018-04-13 MED FILL — LINZESS 72 MCG CAPSULE: 72 | 30 days supply | Qty: 30 | Fill #0 | Status: TO

## 2018-04-13 NOTE — Progress Notes (Signed)
[facility] Progress Note   Rebecca Lucas [id number] [birth year] 62 y.o.  Rebecca Lucas is managed by Dr. Fanny Bien. Mohamed  Actively treated with chemotherapy/immunotherapy: yes  Current Therapy: Carboplatin, Alimta, and Keytruda  Last Treated: 04/11/2018 (cycle 1, day 1)  Assessment: Plan:    Oral candidiasis  Oral dryness  Drug-induced constipation   Oral candidiasis: The patient has just completed a 10-day course of Diflucan but continues to have several areas of plaquing in her oropharynx suggestive of continued oral candidiasis.  After a discussion with the patient and her husband, the decision was made to extend her Diflucan by 5 additional days.  Oral dryness: The patient was given a prescription for Magic mouthwash to use for her oral dryness  Opioid-induced constipation: The patient has been using fiber, Dulcolax, and MiraLAX without improvement in her opioid-induced constipation.  A prescription for Amitiza was sent to her pharmacy.  Please see After Visit Summary for patient specific instructions.  Future Appointments  Date Time Provider Media  04/13/2018 11:45 AM [facility] LINAC 1 [facility] None  04/16/2018  2:45 PM [facility] LINAC 1 [facility] None  04/17/2018  2:45 PM [facility] LINAC 1 [facility] None  04/18/2018  2:00 PM [facility] LAB 1 [facility] None  04/18/2018  2:45 PM [facility] LINAC 1 [facility] None  04/19/2018  2:45 PM [facility] LINAC 1 [facility] None  04/20/2018  2:45 PM [facility] LINAC 1 [facility] None  04/25/2018  2:00 PM [facility] LAB 3 [facility] None  05/01/2018  9:15 AM [facility] LAB 6 [facility] None  05/01/2018  9:45 AM Curt Bears, MD [facility] None  05/01/2018 11:15 AM [facility] INFUSION [facility] None  05/09/2018  2:00 PM [facility] LAB 1 [facility] None  05/16/2018  2:00 PM [facility] LAB 1 [facility] None  05/22/2018  9:30 AM [facility] ONLY [facility] None  05/22/2018 10:00  AM Curt Bears, MD [facility] None  05/22/2018 11:15 AM [facility] INFUSION [facility] None  05/30/2018  2:00 PM [facility] LAB 1 [facility] None  06/06/2018  2:00 PM [facility] LAB 1 [facility] None  06/13/2018 10:45 AM [facility] LAB 1 [facility] None  06/13/2018 11:15 AM Curt Bears, MD [facility] None  06/13/2018 12:00 PM [facility] INFUSION [facility] None    No orders of the defined types were placed in this encounter.      Subjective:   Patient ID:  Rebecca Lucas is a 62 y.o. (DOB 04-Nov-1955) female.  Chief Complaint: No chief complaint on file.   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  According to Dr. Worthy Flank note a metastatic sarcoma could not be excluded.  The patient is followed by Dr. Fanny Bien. Moham this provider was asked to see the patient in infusion today.  Ed and is receiving cycle 1, day 1 of carboplatin, Alimta, and Keytruda today.  She has been treated recently for oral candidiasis and continues to have areas of white plaquing in her tongue and posterior pharynx.  She also reports having oral dryness.  She is taking Percocet for pain and has developed constipation despite her use of fiber, Dulcolax, and MiraLAX.  She denies other issues of concern.  She presents to treatment today with her husband accompanying her.  Her husband is a primary care physician.  Medications: I have reviewed the patient's current medications.  Allergies: No Known  Allergies  No past medical history on file.  No past surgical history on file.  Family History  Problem Relation Age of Onset  . ALS Mother 18       non genetic  . Cancer Neg Hx     Social History   Socioeconomic History  . Marital status: Married    Spouse name: Not on file  . Number of  children: Not on file  . Years of education: Not on file  . Highest education level: Not on file  Occupational History  . Not on file  Social Needs  . Financial resource strain: Not on file  . Food insecurity:    Worry: Not on file    Inability: Not on file  . Transportation needs:    Medical: Not on file    Non-medical: Not on file  Tobacco Use  . Smoking status: Never Smoker  . Smokeless tobacco: Never Used  Substance and Sexual Activity  . Alcohol use: Yes    Comment: occasionally  . Drug use: Never  . Sexual activity: Yes  Lifestyle  . Physical activity:    Days per week: Not on file    Minutes per session: Not on file  . Stress: Not on file  Relationships  . Social connections:    Talks on phone: Not on file    Gets together: Not on file    Attends religious service: Not on file    Active member of club or organization: Not on file    Attends meetings of clubs or organizations: Not on file    Relationship status: Not on file  . Intimate partner violence:    Fear of current or ex partner: Not on file    Emotionally abused: Not on file    Physically abused: Not on file    Forced sexual activity: Not on file  Other Topics Concern  . Not on file  Social History Narrative   Married with two children. Her husband is Dr. [name], a physician with occupational health. She has worked several jobs Energy manager, Glass blower/designer, and now the [employer].     Past Medical History, Surgical history, Social history, and Family history were reviewed and updated as appropriate.   Please see review of systems for further details on the patient's review from today.   Review of Systems:  Review of Systems  Constitutional: Negative for appetite change, chills, diaphoresis, fever and unexpected weight change.  HENT: Negative for sore throat and trouble swallowing.        Oral dryness and whitish plaquing of the oropharynx.  Gastrointestinal: Positive for  constipation. Negative for abdominal distention, abdominal pain, anal bleeding, blood in stool, diarrhea, nausea and vomiting.    Objective:   Physical Exam:  There were no vitals taken for this visit. ECOG: 0  Physical Exam  Constitutional: No distress.  HENT:  Head: Normocephalic and atraumatic.  Areas of whitish plaquing is noted over the surface of the tongue and posterior pharynx.  Cardiovascular: Normal rate, regular rhythm and normal heart sounds. Exam reveals no gallop and no friction rub.  No murmur heard. Pulmonary/Chest: Effort normal and breath sounds normal. No respiratory distress. She has no wheezes. She has no rales.  Neurological: She is alert.  Skin: Skin is warm and dry. She is not diaphoretic.    Lab Review:     Component Value Date/Time   NA 132 (L) 04/10/2018 1332   K 4.8 04/10/2018 1332  CL 95 (L) 04/10/2018 1332   CO2 29 04/10/2018 1332   GLUCOSE 102 (H) 04/10/2018 1332   BUN 15 04/10/2018 1332   CREATININE 0.66 04/10/2018 1332   CALCIUM 9.2 04/10/2018 1332   PROT 7.3 04/10/2018 1332   ALBUMIN 2.5 (L) 04/10/2018 1332   AST 59 (H) 04/10/2018 1332   ALT 66 (H) 04/10/2018 1332   ALKPHOS 138 (H) 04/10/2018 1332   BILITOT 0.3 04/10/2018 1332   GFRNONAA >60 04/10/2018 1332   GFRAA >60 04/10/2018 1332       Component Value Date/Time   WBC 10.5 (H) 04/10/2018 1332   WBC 17.7 (H) 03/23/2018 1927   RBC 3.43 (L) 04/10/2018 1332   HGB 9.2 (L) 04/10/2018 1332   HCT 29.1 (L) 04/10/2018 1332   PLT 379 04/10/2018 1332   MCV 84.8 04/10/2018 1332   MCH 26.8 04/10/2018 1332   MCHC 31.6 04/10/2018 1332   RDW 14.7 (H) 04/10/2018 1332   LYMPHSABS 0.5 (L) 04/10/2018 1332   MONOABS 0.6 04/10/2018 1332   EOSABS 0.6 (H) 04/10/2018 1332   BASOSABS 0.0 04/10/2018 1332   -------------------------------  Imaging from last 24 hours (if applicable):  Radiology interpretation: Dg Chest 2 View  Result Date: 03/23/2018 CLINICAL DATA:  Patient BIB husband,  states today was second round of radiation to neck mass. States fever developed after that time. Pt had lung biopsy on Thursday. Seen on 7/7 for cough. Reports taking abx as prescribed. States cough has decreased. EXAM: CHEST - 2 VIEW COMPARISON:  03/22/2018 and older exams. FINDINGS: Right upper lobe mass is without significant change from the previous day's chest radiograph. Minor atelectasis at the right lung base. Lungs otherwise clear. Cardiac silhouette is normal in size. No mediastinal or hilar masses. No pneumothorax. Skeletal structures are intact. IMPRESSION: 1. No acute cardiopulmonary disease. 2. Large right upper lobe mass unchanged from the previous day's study. 3. Minor right lung base atelectasis has developed since the prior study. No other change. Electronically Signed   By: Lajean Manes M.D.   On: 03/23/2018 21:06   Dg Chest 2 View  Result Date: 03/18/2018 CLINICAL DATA:  62 y/o  F; productive cough and fever for 2-3 days. EXAM: CHEST - 2 VIEW COMPARISON:  03/05/2018 PET-CT. FINDINGS: Normal cardiac silhouette. Aortic atherosclerosis with calcification. Stable right upper lobe mass better characterized on prior PET-CT. No new consolidation. No pleural effusion or pneumothorax. No acute osseous abnormality is evident. IMPRESSION: 1. Stable right upper lobe mass. 2. No new acute pulmonary process identified. Electronically Signed   By: Kristine Garbe M.D.   On: 03/18/2018 14:56   Ct Angio Chest Pe W And/or Wo Contrast  Result Date: 03/18/2018 CLINICAL DATA:  Fever and productive cough.  Neck mass. EXAM: CT ANGIOGRAPHY CHEST WITH CONTRAST TECHNIQUE: Multidetector CT imaging of the chest was performed using the standard protocol during bolus administration of intravenous contrast. Multiplanar CT image reconstructions and MIPs were obtained to evaluate the vascular anatomy. CONTRAST:  178mL ISOVUE-370 IOPAMIDOL (ISOVUE-370) INJECTION 76% COMPARISON:  PET-CT 03/05/2018. FINDINGS:  Cardiovascular: The heart size is normal. No substantial pericardial effusion. No thoracic aortic aneurysm. Atherosclerotic calcification is noted in the wall of the thoracic aorta. No filling defect within the opacified pulmonary arteries to suggest the presence of an acute pulmonary embolus. Mediastinum/Nodes: 16 mm short axis precarinal lymph node evident. There is a 12 mm short axis right paratracheal lymph node. No left hilar lymphadenopathy. 13 mm short axis right hilar lymph node evident. The esophagus  has normal imaging features. There is no axillary lymphadenopathy. Lungs/Pleura: The central tracheobronchial airways are patent. Posterior right upper lobe pulmonary mass measures 6.1 x 5.9 cm. This encases and attenuates airways to the posterior right upper lobe. Lesion likely invades through the major fissure into the anterior aspect of the right upper lobe. Subsegmental atelectasis with confluent airspace consolidation noted in the posterior right middle lobe. This right middle lobe finding is new in the interval. Compressive atelectasis noted in the lower lobes bilaterally. Tiny right pleural effusion evident. 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 Musculoskeletal: No worrisome lytic or sclerotic osseous abnormality. Review of the MIP images confirms the above findings. IMPRESSION: 1. No CT evidence for acute pulmonary embolus. 2. Large posterior right upper lobe pulmonary mass with likely extension through the major fissure. This is associated with mediastinal and right hilar lymphadenopathy and necrotic left adrenal nodule. Disease characterized on recent PET-CT of 03/05/2018. 3. Posterior right middle lobe collapse/consolidation. This is new since 03/05/2018 and may be atelectasis and/or pneumonia. Electronically Signed   By: Misty Stanley M.D.   On: 03/18/2018 17:23   Ct Biopsy  Result Date:  03/22/2018 INDICATION: Right upper lobe hypermetabolic pulmonary mass. Note, patient underwent successful CT and ultrasound-guided biopsy of occipital mass on 03/08/2018 however pathology remains indeterminate. Request has made for CT-guided biopsy of presumed primary malignancy within the right upper lobe. EXAM: CT GUIDED RIGHT UPPER LOBE PULMONARY MASS BIOPSY COMPARISON:  PET-CT - 03/05/2018; chest CT - 03/18/2018 MEDICATIONS: None. ANESTHESIA/SEDATION: Fentanyl 50 mcg IV; Versed 1 mg IV Sedation time: 22 minutes; The patient was continuously monitored during the procedure by the interventional radiology nurse under my direct supervision. CONTRAST:  None COMPLICATIONS: None immediate. PROCEDURE: Informed consent was obtained from the patient following an explanation of the procedure, risks, benefits and alternatives. The patient understands,agrees and consents for the procedure. All questions were addressed. A time out was performed prior to the initiation of the procedure. The patient was positioned left lateral decubitus, slightly LPO on the CT table and a limited chest CT was performed for procedural planning demonstrating unchanged size and appearance of the approximately 6.3 x 5.7 cm hypermetabolic right upper lobe pulmonary mass (image 20, series 3). The operative site was prepped and draped in the usual sterile fashion. Under sterile conditions and local anesthesia, a 17 gauge coaxial needle was advanced into the peripheral aspect of the nodule. Positioning was confirmed with intermittent CT fluoroscopy and followed by the acquisition of 6 core needle biopsy samples with an 18 gauge core needle biopsy device. The coaxial needle was removed following deployment of a Bios[employer] plug and superficial hemostasis was achieved with manual compression. Limited post procedural chest CT was negative for pneumothorax or additional complication. A dressing was placed. The patient tolerated the procedure well without  immediate postprocedural complication. The patient was escorted to have an upright chest radiograph. IMPRESSION: Technically successful CT guided core needle core biopsy of hypermetabolic right upper lobe pulmonary mass. Electronically Signed   By: Sandi Mariscal M.D.   On: 03/22/2018 17:18   Dg Chest Port 1 View  Result Date: 03/22/2018 CLINICAL DATA:  Right lung biopsy from mass EXAM: PORTABLE CHEST 1 VIEW COMPARISON:  03/18/2018 CXR, same day CT chest for lung biopsy FINDINGS: No pneumothorax status post biopsy of right upper lobe mass. Heart and mediastinal contours are stable. Aortic atherosclerosis noted at the arch without aneurysmal  dilatation. No effusion. No aggressive osseous abnormality. IMPRESSION: No pneumothorax status post biopsy of right upper lobe mass. Electronically Signed   By: Ashley Royalty M.D.   On: 03/22/2018 16:00

## 2018-04-16 ENCOUNTER — Ambulatory Visit
Admission: RE | Admit: 2018-04-16 | Discharge: 2018-04-16 | Disposition: A | Discharge status: Home / Self Care | Payer: 59 | Source: Ambulatory Visit | Attending: Radiation Oncology | Admitting: Radiation Oncology

## 2018-04-16 DIAGNOSIS — C7951 Secondary malignant neoplasm of bone: Secondary | ICD-10-CM | POA: Diagnosis not present

## 2018-04-16 DIAGNOSIS — Z510 Encounter for antineoplastic radiation therapy: Secondary | ICD-10-CM | POA: Diagnosis not present

## 2018-04-16 DIAGNOSIS — C3411 Malignant neoplasm of upper lobe, right bronchus or lung: Secondary | ICD-10-CM | POA: Diagnosis not present

## 2018-04-17 ENCOUNTER — Ambulatory Visit
Admission: RE | Admit: 2018-04-17 | Discharge: 2018-04-17 | Disposition: A | Discharge status: Home / Self Care | Payer: 59 | Source: Ambulatory Visit | Attending: Radiation Oncology | Admitting: Radiation Oncology

## 2018-04-17 DIAGNOSIS — C3411 Malignant neoplasm of upper lobe, right bronchus or lung: Secondary | ICD-10-CM | POA: Diagnosis not present

## 2018-04-17 DIAGNOSIS — Z510 Encounter for antineoplastic radiation therapy: Secondary | ICD-10-CM | POA: Diagnosis not present

## 2018-04-17 DIAGNOSIS — C7951 Secondary malignant neoplasm of bone: Secondary | ICD-10-CM | POA: Diagnosis not present

## 2018-04-18 ENCOUNTER — Ambulatory Visit
Admission: RE | Admit: 2018-04-18 | Discharge: 2018-04-18 | Disposition: A | Discharge status: Home / Self Care | Payer: 59 | Source: Ambulatory Visit | Attending: Radiation Oncology | Admitting: Radiation Oncology

## 2018-04-18 ENCOUNTER — Inpatient Hospital Stay: Payer: 59 | Attending: Internal Medicine

## 2018-04-18 DIAGNOSIS — R634 Abnormal weight loss: Secondary | ICD-10-CM | POA: Insufficient documentation

## 2018-04-18 DIAGNOSIS — C801 Malignant (primary) neoplasm, unspecified: Secondary | ICD-10-CM | POA: Diagnosis not present

## 2018-04-18 DIAGNOSIS — R1310 Dysphagia, unspecified: Secondary | ICD-10-CM | POA: Diagnosis not present

## 2018-04-18 DIAGNOSIS — Z5112 Encounter for antineoplastic immunotherapy: Secondary | ICD-10-CM | POA: Diagnosis not present

## 2018-04-18 DIAGNOSIS — R52 Pain, unspecified: Secondary | ICD-10-CM | POA: Insufficient documentation

## 2018-04-18 DIAGNOSIS — C7951 Secondary malignant neoplasm of bone: Secondary | ICD-10-CM

## 2018-04-18 DIAGNOSIS — Z79899 Other long term (current) drug therapy: Secondary | ICD-10-CM | POA: Insufficient documentation

## 2018-04-18 DIAGNOSIS — Z510 Encounter for antineoplastic radiation therapy: Secondary | ICD-10-CM | POA: Diagnosis not present

## 2018-04-18 DIAGNOSIS — C3411 Malignant neoplasm of upper lobe, right bronchus or lung: Secondary | ICD-10-CM | POA: Diagnosis not present

## 2018-04-18 LAB — CBC WITH DIFFERENTIAL (CANCER CENTER ONLY)
Basophils Absolute: 0 10*3/uL (ref 0.0–0.1)
Basophils Relative: 0 %
EOS ABS: 0.2 10*3/uL (ref 0.0–0.5)
EOS PCT: 8 %
HCT: 29.7 % — ABNORMAL LOW (ref 34.8–46.6)
Hemoglobin: 9.7 g/dL — ABNORMAL LOW (ref 11.6–15.9)
LYMPHS ABS: 0.2 10*3/uL — AB (ref 0.9–3.3)
Lymphocytes Relative: 9 %
MCH: 26.8 pg (ref 25.1–34.0)
MCHC: 32.7 g/dL (ref 31.5–36.0)
MCV: 82 fL (ref 79.5–101.0)
Monocytes Absolute: 0.4 10*3/uL (ref 0.1–0.9)
Monocytes Relative: 18 %
Neutro Abs: 1.5 10*3/uL (ref 1.5–6.5)
Neutrophils Relative %: 65 %
PLATELETS: 219 10*3/uL (ref 145–400)
RBC: 3.62 MIL/uL — AB (ref 3.70–5.45)
RDW: 14.2 % (ref 11.2–14.5)
WBC: 2.3 10*3/uL — AB (ref 3.9–10.3)

## 2018-04-18 LAB — CMP (CANCER CENTER ONLY)
ALBUMIN: 2.4 g/dL — AB (ref 3.5–5.0)
ALT: 66 U/L — AB (ref 0–44)
AST: 28 U/L (ref 15–41)
Alkaline Phosphatase: 123 U/L (ref 38–126)
Anion gap: 11 (ref 5–15)
BILIRUBIN TOTAL: 0.3 mg/dL (ref 0.3–1.2)
BUN: 15 mg/dL (ref 6–20)
CHLORIDE: 95 mmol/L — AB (ref 98–111)
CO2: 25 mmol/L (ref 22–32)
CREATININE: 0.61 mg/dL (ref 0.44–1.00)
Calcium: 8.5 mg/dL — ABNORMAL LOW (ref 8.9–10.3)
GFR, Est AFR Am: >60 mL/min (ref >60)
Glucose, Bld: 108 mg/dL — ABNORMAL HIGH (ref 70–99)
Potassium: 3.8 mmol/L (ref 3.5–5.1)
Sodium: 131 mmol/L — ABNORMAL LOW (ref 135–145)
TOTAL PROTEIN: 6.7 g/dL (ref 6.5–8.1)

## 2018-04-19 ENCOUNTER — Ambulatory Visit
Admission: RE | Admit: 2018-04-19 | Discharge: 2018-04-19 | Disposition: A | Discharge status: Home / Self Care | Payer: 59 | Source: Ambulatory Visit | Attending: Radiation Oncology | Admitting: Radiation Oncology

## 2018-04-19 DIAGNOSIS — C3411 Malignant neoplasm of upper lobe, right bronchus or lung: Secondary | ICD-10-CM | POA: Diagnosis not present

## 2018-04-19 DIAGNOSIS — Z510 Encounter for antineoplastic radiation therapy: Secondary | ICD-10-CM | POA: Diagnosis not present

## 2018-04-19 DIAGNOSIS — C7951 Secondary malignant neoplasm of bone: Secondary | ICD-10-CM | POA: Diagnosis not present

## 2018-04-20 ENCOUNTER — Encounter: Payer: Self-pay | Admitting: Radiation Oncology

## 2018-04-20 ENCOUNTER — Ambulatory Visit
Admission: RE | Admit: 2018-04-20 | Discharge: 2018-04-20 | Disposition: A | Discharge status: Home / Self Care | Payer: 59 | Source: Ambulatory Visit | Attending: Radiation Oncology | Admitting: Radiation Oncology

## 2018-04-20 DIAGNOSIS — C3411 Malignant neoplasm of upper lobe, right bronchus or lung: Secondary | ICD-10-CM | POA: Diagnosis not present

## 2018-04-20 DIAGNOSIS — C7951 Secondary malignant neoplasm of bone: Secondary | ICD-10-CM | POA: Diagnosis not present

## 2018-04-20 DIAGNOSIS — Z510 Encounter for antineoplastic radiation therapy: Secondary | ICD-10-CM | POA: Diagnosis not present

## 2018-04-23 ENCOUNTER — Telehealth: Payer: Self-pay | Admitting: Emergency Medicine

## 2018-04-23 NOTE — Telephone Encounter (Signed)
Returning pt's VM.  Pt using linzess with fiber, dulcolax, and miralax as supplements for constipation with some success but still have issues.  Per PA Lucianne Lei pt to try Mag citrate, 1/2 bottle, wait an hour, then drink second half.  Pt advised to wait until tomorrow to restart other aides and to call if the [facility] isn't successful/if she has further issues or questions.  Verbalized understanding.

## 2018-04-25 ENCOUNTER — Inpatient Hospital Stay: Payer: 59

## 2018-04-25 ENCOUNTER — Telehealth: Payer: Self-pay | Admitting: Internal Medicine

## 2018-04-25 DIAGNOSIS — R634 Abnormal weight loss: Secondary | ICD-10-CM | POA: Diagnosis not present

## 2018-04-25 DIAGNOSIS — C801 Malignant (primary) neoplasm, unspecified: Secondary | ICD-10-CM | POA: Diagnosis not present

## 2018-04-25 DIAGNOSIS — Z79899 Other long term (current) drug therapy: Secondary | ICD-10-CM | POA: Diagnosis not present

## 2018-04-25 DIAGNOSIS — C7951 Secondary malignant neoplasm of bone: Secondary | ICD-10-CM

## 2018-04-25 DIAGNOSIS — R1310 Dysphagia, unspecified: Secondary | ICD-10-CM | POA: Diagnosis not present

## 2018-04-25 DIAGNOSIS — R52 Pain, unspecified: Secondary | ICD-10-CM | POA: Diagnosis not present

## 2018-04-25 DIAGNOSIS — Z5112 Encounter for antineoplastic immunotherapy: Secondary | ICD-10-CM | POA: Diagnosis not present

## 2018-04-25 LAB — CMP (CANCER CENTER ONLY)
ALK PHOS: 117 U/L (ref 38–126)
ALT: 37 U/L (ref 0–44)
AST: 26 U/L (ref 15–41)
Albumin: 2.2 g/dL — ABNORMAL LOW (ref 3.5–5.0)
Anion gap: 10 (ref 5–15)
BUN: 14 mg/dL (ref 6–20)
CHLORIDE: 97 mmol/L — AB (ref 98–111)
CO2: 26 mmol/L (ref 22–32)
Calcium: 8 mg/dL — ABNORMAL LOW (ref 8.9–10.3)
Creatinine: 0.59 mg/dL (ref 0.44–1.00)
GFR, Est AFR Am: >60 mL/min (ref >60)
GFR, Estimated: >60 mL/min (ref >60)
GLUCOSE: 119 mg/dL — AB (ref 70–99)
Potassium: 4 mmol/L (ref 3.5–5.1)
SODIUM: 133 mmol/L — AB (ref 135–145)
Total Bilirubin: <0.2 mg/dL — ABNORMAL LOW (ref 0.3–1.2)
Total Protein: 6.3 g/dL — ABNORMAL LOW (ref 6.5–8.1)

## 2018-04-25 LAB — CBC WITH DIFFERENTIAL (CANCER CENTER ONLY)
Basophils Absolute: 0 10*3/uL (ref 0.0–0.1)
Basophils Relative: 0 %
EOS PCT: 5 %
Eosinophils Absolute: 0.1 10*3/uL (ref 0.0–0.5)
HCT: 27.1 % — ABNORMAL LOW (ref 34.8–46.6)
Hemoglobin: 8.9 g/dL — ABNORMAL LOW (ref 11.6–15.9)
LYMPHS ABS: 0.2 10*3/uL — AB (ref 0.9–3.3)
Lymphocytes Relative: 6 %
MCH: 27 pg (ref 25.1–34.0)
MCHC: 32.7 g/dL (ref 31.5–36.0)
MCV: 82.5 fL (ref 79.5–101.0)
MONO ABS: 0.2 10*3/uL (ref 0.1–0.9)
MONOS PCT: 9 %
Neutro Abs: 2.1 10*3/uL (ref 1.5–6.5)
Neutrophils Relative %: 80 %
PLATELETS: 303 10*3/uL (ref 145–400)
RBC: 3.29 MIL/uL — ABNORMAL LOW (ref 3.70–5.45)
RDW: 15.3 % — AB (ref 11.2–14.5)
WBC Count: 2.7 10*3/uL — ABNORMAL LOW (ref 3.9–10.3)

## 2018-04-25 NOTE — Telephone Encounter (Signed)
MM PAL - moved 8/20 appointments to 8/21 - APP okper MM. Spoke with patient she is aware.

## 2018-04-26 NOTE — Progress Notes (Signed)
  Radiation Oncology         [phone]) [phone] ________________________________  Name: Rebecca Lucas MRN: [id number]  Date: 04/20/2018  DOB: 1956-01-29  End of Treatment Note  Diagnosis:   62 y.o. women with right upper lung cancer.    Indication for treatment:  Palliative        Radiation treatment dates:   03/29/18 - 04/20/18   Site/dose:    1. 30 Gy directed to the RIGHT hip delivered in 10 fractions 2. 30 Gy directed to the RIGHT lung delivered in 10 fractions  Beams/energy:    1. 15X photons using a 3D technique  2. 10X and 6X photons using a 3D technique.  Narrative: The patient tolerated radiation treatment relatively well. Patient reported having pain in her lower back. She states that she has mild fatigue, and that she is eating four to five small meals a day, and having a good appetite in general.. She denies having any shortness of breath, difficulty swallowing or coughing. She also denies having any symptoms of hpotension. She has been having chemotherapy every three weeks. She did have some weight loss throughout th ecourse of treatment which surprised her, but we advised her to increase her nutritous uptake. Patient also underwent stereotactic radiation to the cervical spine, with a total dose of 50 Gy delivered in 5 fractions using 10X-FFF photons and a SBRT/SRT- VMAT technique.  Plan: The patient has completed radiation treatment. The patient will return to radiation oncology clinic for routine followup in one month. I advised her to call or return sooner if she has any questions or concerns related to her recovery or treatment. ________________________________  Sheral Apley. Tammi Klippel, M.D.   This document serves as a record of services personally performed by Tyler Pita MD. It was created on his behalf by Delton Coombes, a trained medical scribe. The creation of this record is based on the scribe's personal observations and the provider's statements to them.

## 2018-04-27 ENCOUNTER — Telehealth: Payer: Self-pay | Admitting: Medical Oncology

## 2018-04-27 NOTE — Telephone Encounter (Signed)
Per Julien Nordmann ,  I instructed pt to monitor for now and to call for chills or other signs of infection or feeling worse.

## 2018-04-27 NOTE — Telephone Encounter (Signed)
Fever 101 last night and persists this am.  The only other complaint is fatigue. She took tylenol and Ibuprophen last night.

## 2018-05-01 ENCOUNTER — Other Ambulatory Visit: Payer: 59

## 2018-05-01 ENCOUNTER — Ambulatory Visit: Payer: 59 | Admitting: Internal Medicine

## 2018-05-01 ENCOUNTER — Ambulatory Visit: Payer: 59

## 2018-05-01 MED FILL — FOLIC ACID 1 MG TABS: 1 | 30 days supply | Qty: 30 | Fill #1

## 2018-05-02 ENCOUNTER — Inpatient Hospital Stay: Payer: 59

## 2018-05-02 ENCOUNTER — Encounter: Payer: Self-pay | Admitting: Oncology

## 2018-05-02 ENCOUNTER — Encounter: Payer: Self-pay | Admitting: *Deleted

## 2018-05-02 ENCOUNTER — Telehealth: Payer: Self-pay | Admitting: Oncology

## 2018-05-02 ENCOUNTER — Inpatient Hospital Stay (HOSPITAL_BASED_OUTPATIENT_CLINIC_OR_DEPARTMENT_OTHER): Payer: 59 | Admitting: Oncology

## 2018-05-02 VITALS — BP 102/63 | HR 109 | Temp 98.4°F | Resp 17 | Ht 67.0 in | Wt 118.1 lb

## 2018-05-02 DIAGNOSIS — C801 Malignant (primary) neoplasm, unspecified: Secondary | ICD-10-CM | POA: Diagnosis not present

## 2018-05-02 DIAGNOSIS — Z79899 Other long term (current) drug therapy: Secondary | ICD-10-CM

## 2018-05-02 DIAGNOSIS — C7951 Secondary malignant neoplasm of bone: Secondary | ICD-10-CM

## 2018-05-02 DIAGNOSIS — Z5111 Encounter for antineoplastic chemotherapy: Secondary | ICD-10-CM

## 2018-05-02 DIAGNOSIS — R634 Abnormal weight loss: Secondary | ICD-10-CM

## 2018-05-02 DIAGNOSIS — R52 Pain, unspecified: Secondary | ICD-10-CM

## 2018-05-02 DIAGNOSIS — Z923 Personal history of irradiation: Secondary | ICD-10-CM | POA: Diagnosis not present

## 2018-05-02 DIAGNOSIS — Z5112 Encounter for antineoplastic immunotherapy: Secondary | ICD-10-CM | POA: Diagnosis not present

## 2018-05-02 DIAGNOSIS — R1310 Dysphagia, unspecified: Secondary | ICD-10-CM | POA: Diagnosis not present

## 2018-05-02 LAB — CBC WITH DIFFERENTIAL (CANCER CENTER ONLY)
BASOS PCT: 1 %
Basophils Absolute: 0 10*3/uL (ref 0.0–0.1)
EOS PCT: 5 %
Eosinophils Absolute: 0.3 10*3/uL (ref 0.0–0.5)
HEMATOCRIT: 30.3 % — AB (ref 34.8–46.6)
HEMOGLOBIN: 9.8 g/dL — AB (ref 11.6–15.9)
Lymphocytes Relative: 6 %
Lymphs Abs: 0.3 10*3/uL — ABNORMAL LOW (ref 0.9–3.3)
MCH: 27.1 pg (ref 25.1–34.0)
MCHC: 32.5 g/dL (ref 31.5–36.0)
MCV: 83.4 fL (ref 79.5–101.0)
MONO ABS: 0.5 10*3/uL (ref 0.1–0.9)
MONOS PCT: 9 %
NEUTROS ABS: 4 10*3/uL (ref 1.5–6.5)
Neutrophils Relative %: 79 %
Platelet Count: 496 10*3/uL — ABNORMAL HIGH (ref 145–400)
RBC: 3.63 MIL/uL — ABNORMAL LOW (ref 3.70–5.45)
RDW: 16.3 % — ABNORMAL HIGH (ref 11.2–14.5)
WBC Count: 5.1 10*3/uL (ref 3.9–10.3)

## 2018-05-02 LAB — CMP (CANCER CENTER ONLY)
ALK PHOS: 208 U/L — AB (ref 38–126)
ALT: 96 U/L — AB (ref 0–44)
ANION GAP: 9 (ref 5–15)
AST: 73 U/L — AB (ref 15–41)
Albumin: 2.5 g/dL — ABNORMAL LOW (ref 3.5–5.0)
BUN: 12 mg/dL (ref 6–20)
CALCIUM: 9.2 mg/dL (ref 8.9–10.3)
CO2: 27 mmol/L (ref 22–32)
Chloride: 101 mmol/L (ref 98–111)
Creatinine: 0.68 mg/dL (ref 0.44–1.00)
GFR, Est AFR Am: >60 mL/min (ref >60)
GFR, Estimated: >60 mL/min (ref >60)
Glucose, Bld: 105 mg/dL — ABNORMAL HIGH (ref 70–99)
Potassium: 3.8 mmol/L (ref 3.5–5.1)
SODIUM: 137 mmol/L (ref 135–145)
Total Bilirubin: <0.2 mg/dL — ABNORMAL LOW (ref 0.3–1.2)
Total Protein: 7.2 g/dL (ref 6.5–8.1)

## 2018-05-02 LAB — TSH: TSH: 0.715 u[IU]/mL (ref 0.308–3.960)

## 2018-05-02 MED ORDER — SODIUM CHLORIDE 0.9 % IV SOLN
200.0000 mg | Freq: Once | INTRAVENOUS | Status: AC
  Administered 2018-05-02: 200 mg via INTRAVENOUS
  Filled 2018-05-02: qty 8

## 2018-05-02 MED ORDER — PALONOSETRON HCL INJECTION 0.25 MG/5ML
INTRAVENOUS | Status: AC
  Filled 2018-05-02: qty 5

## 2018-05-02 MED ORDER — PALONOSETRON HCL INJECTION 0.25 MG/5ML
0.2500 mg | Freq: Once | INTRAVENOUS | Status: AC
  Administered 2018-05-02: 0.25 mg via INTRAVENOUS

## 2018-05-02 MED ORDER — SODIUM CHLORIDE 0.9 % IV SOLN
800.0000 mg | Freq: Once | INTRAVENOUS | Status: AC
  Administered 2018-05-02: 800 mg via INTRAVENOUS
  Filled 2018-05-02: qty 20

## 2018-05-02 MED ORDER — SODIUM CHLORIDE 0.9 % IV SOLN
Freq: Once | INTRAVENOUS | Status: AC
  Administered 2018-05-02: 13:00:00 via INTRAVENOUS
  Filled 2018-05-02: qty 250

## 2018-05-02 MED ORDER — SODIUM CHLORIDE 0.9 % IV SOLN
Freq: Once | INTRAVENOUS | Status: AC
  Administered 2018-05-02: 14:00:00 via INTRAVENOUS
  Filled 2018-05-02: qty 5

## 2018-05-02 MED ORDER — MORPHINE SULFATE ER 30 MG PO TBCR: 30.0000 mg | EXTENDED_RELEASE_TABLET | Freq: Two times a day (BID) | ORAL | 0 refills | Status: DC

## 2018-05-02 MED ORDER — SODIUM CHLORIDE 0.9 % IV SOLN
462.0000 mg | Freq: Once | INTRAVENOUS | Status: AC
  Administered 2018-05-02: 460 mg via INTRAVENOUS
  Filled 2018-05-02: qty 46

## 2018-05-02 MED ORDER — SUCRALFATE 1 GM/10ML PO SUSP: 1.0000 g | Freq: Three times a day (TID) | ORAL | 0 refills | Status: DC

## 2018-05-02 MED FILL — CARAFATE 1 GM/10 ML SUSP: 1 | 10 days supply | Qty: 420 | Fill #0

## 2018-05-02 MED FILL — MORPHINE SULF ER 30 MG TAB: 30 | 30 days supply | Qty: 60 | Fill #0

## 2018-05-02 NOTE — Progress Notes (Signed)
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  Metastatic sarcoma cannot be also excluded at this point.  Molecular studies showed no actionable mutations. PDL 1 expression 99%  PRIOR THERAPY:Palliative radiotherapy to the neck mass and hip lesion under the care of Dr. Tammi Klippel   CURRENT THERAPY: Systemic chemotherapy with carboplatin for AUC of 5, Alimta 500 mg/M2 and Keytruda 200 mg IV every 3 weeks.  First dose given on 04/11/2018.  Status post 1 cycle.  INTERVAL HISTORY: Rebecca Lucas 62 y.o. female returns for routine follow-up visit by herself.  The patient is feeling fine today and has no specific complaints except for weight loss and a burning sensation in her esophagus at times.  This is likely due to her prior radiation.  She is not currently taking any medication for reflux.  The patient denies fevers and chills.  Denies chest pain, shortness of breath, cough, hemoptysis.  Denies nausea, vomiting, constipation, diarrhea.  Pain is overall well controlled with MS Contin.  She uses Percocet very rarely.  She tolerated her first cycle of treatment fairly well.  The patient is here for evaluation prior to cycle #2 of her treatment.  MEDICAL HISTORY:History reviewed. No pertinent past medical history.  ALLERGIES:  has No Known Allergies.  MEDICATIONS:  Current Outpatient Medications  Medication Sig Dispense Refill  . acetaminophen (TYLENOL) 500 MG tablet Take 1,000 mg by mouth every 6 (six) hours as needed for mild pain.     Marland Kitchen alendronate (FOSAMAX) 70 MG tablet Take 70 mg by mouth once a week. Wednesday  3  . butalbital-aspirin-caffeine (FIORINAL) 50-325-40 MG capsule Take 1  capsule by mouth every 6 (six) hours as needed for migraine.   1  . Calcium Carbonate-Vitamin D (CALCIUM-D PO) Take 1 tablet by mouth 2 (two) times daily.    . fluconazole (DIFLUCAN) 200 MG tablet Take 1 tablet (200 mg total) by mouth daily. 5 tablet 0  . folic acid (FOLVITE) 1 MG tablet Take 1 tablet (1 mg total) by mouth daily. 30 tablet 4  . ibuprofen (ADVIL,MOTRIN) 200 MG tablet Take 600 mg by mouth every 6 (six) hours as needed for mild pain.     Marland Kitchen linaclotide (LINZESS) 72 MCG capsule Take 1 capsule (72 mcg total) by mouth daily before breakfast. 30 capsule 2  . lubiprostone (AMITIZA) 24 MCG capsule Take 1 capsule (24 mcg total) by mouth 2 (two) times daily with a meal. 60 capsule 2  . LUTEIN PO Take 1 tablet by mouth daily.    . magic mouthwash SOLN Take 5 mLs by mouth 4 (four) times daily as needed for mouth pain. 240 mL 2  . morphine (MS CONTIN) 30 MG 12 hr tablet Take 1 tablet (30 mg total) by mouth every 12 (twelve) hours. 60 tablet 0  . oxyCODONE-acetaminophen (PERCOCET/ROXICET) 5-325 MG tablet Take 1 tablet by mouth every 6 (six) hours as needed for severe pain. 60 tablet 0  . prochlorperazine (COMPAZINE) 10 MG tablet Take 1 tablet (10 mg total) by mouth every 6 (six) hours as needed for nausea or vomiting. 30 tablet 0  . traMADol (ULTRAM) 50 MG tablet Take 50 mg by mouth 4 (four) times daily as needed (pain).   0  .  zolpidem (AMBIEN) 10 MG tablet Take 1 tablet (10 mg total) by mouth at bedtime as needed for sleep. 30 tablet 0  . sucralfate (CARAFATE) 1 GM/10ML suspension Take 10 mLs (1 g total) by mouth 4 (four) times daily -  with meals and at bedtime. 420 mL 0   No current facility-administered medications for this visit.    Facility-Administered Medications Ordered in Other Visits  Medication Dose Route Frequency Provider Last Rate Last Dose  . CARBOplatin (PARAPLATIN) 460 mg in sodium chloride 0.9 % 250 mL chemo infusion  460 mg Intravenous Once Curt Bears, MD      .  fosaprepitant (EMEND) 150 mg, dexamethasone (DECADRON) 12 mg in sodium chloride 0.9 % 145 mL IVPB   Intravenous Once Curt Bears, MD      . palonosetron (ALOXI) injection 0.25 mg  0.25 mg Intravenous Once Curt Bears, MD      . pembrolizumab [facility]) 200 mg in sodium chloride 0.9 % 50 mL chemo infusion  200 mg Intravenous Once Curt Bears, MD      . PEMEtrexed (ALIMTA) 825 mg in sodium chloride 0.9 % 100 mL chemo infusion  500 mg/m2 (Treatment Plan Recorded) Intravenous Once Curt Bears, MD        SURGICAL HISTORY: History reviewed. No pertinent surgical history.  REVIEW OF SYSTEMS:   Review of Systems  Constitutional: Negative for appetite change, chills, fatigue, fever.  Positive for weight loss. HENT:   Negative for mouth sores, nosebleeds, sore throat and trouble swallowing.   Eyes: Negative for eye problems and icterus.  Respiratory: Negative for cough, hemoptysis, shortness of breath and wheezing.   Cardiovascular: Negative for chest pain and leg swelling.  Gastrointestinal: Negative for abdominal pain, constipation, diarrhea, nausea and vomiting.  Positive for odynophagia. Genitourinary: Negative for bladder incontinence, difficulty urinating, dysuria, frequency and hematuria.   Musculoskeletal: Negative for back pain, gait problem, neck pain and neck stiffness.  Skin: Negative for itching and rash.  Neurological: Negative for dizziness, extremity weakness, gait problem, headaches, light-headedness and seizures.  Hematological: Negative for adenopathy. Does not bruise/bleed easily.  Psychiatric/Behavioral: Negative for confusion, depression and sleep disturbance. The patient is not nervous/anxious.     PHYSICAL EXAMINATION:  Blood pressure 102/63, pulse (!) 109, temperature 98.4 F (36.9 C), temperature source Oral, resp. rate 17, height 5\' 7"  (1.702 m), weight 118 lb 1.6 oz (53.6 kg), SpO2 100 %.  ECOG PERFORMANCE STATUS: 1 - Symptomatic but completely  ambulatory  Physical Exam  Constitutional: Oriented to person, place, and time and well-developed, well-nourished, and in no distress. No distress.  HENT:  Head: Normocephalic and atraumatic.  Mouth/Throat: Oropharynx is clear and moist. No oropharyngeal exudate.  Eyes: Conjunctivae are normal. Right eye exhibits no discharge. Left eye exhibits no discharge. No scleral icterus.  Neck: Normal range of motion. Neck supple.  Cardiovascular: Normal rate, regular rhythm, normal heart sounds and intact distal pulses.   Pulmonary/Chest: Effort normal and breath sounds normal. No respiratory distress. No wheezes. No rales.  Abdominal: Soft. Bowel sounds are normal. Exhibits no distension and no mass. There is no tenderness.  Musculoskeletal: Normal range of motion. Exhibits no edema.  Lymphadenopathy:    No cervical adenopathy.  Neurological: Alert and oriented to person, place, and time. Exhibits normal muscle tone. Gait normal. Coordination normal.  Skin: Skin is warm and dry. No rash noted. Not diaphoretic. No erythema. No pallor.  Psychiatric: Mood, memory and judgment normal.  Vitals reviewed.  LABORATORY DATA: Lab Results  Component Value  Date   WBC 5.1 05/02/2018   HGB 9.8 (L) 05/02/2018   HCT 30.3 (L) 05/02/2018   MCV 83.4 05/02/2018   PLT 496 (H) 05/02/2018      Chemistry      Component Value Date/Time   NA 137 05/02/2018 0958   K 3.8 05/02/2018 0958   CL 101 05/02/2018 0958   CO2 27 05/02/2018 0958   BUN 12 05/02/2018 0958   CREATININE 0.68 05/02/2018 0958      Component Value Date/Time   CALCIUM 9.2 05/02/2018 0958   ALKPHOS 208 (H) 05/02/2018 0958   AST 73 (H) 05/02/2018 0958   ALT 96 (H) 05/02/2018 0958   BILITOT <0.2 (L) 05/02/2018 4259       RADIOGRAPHIC STUDIES:  No results found.   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 PDL 1 expression was 99%. The molecular studies by [employer] as well as [employer] did not show any actionable mutations. The patient is currently on carboplatin for AUC of 5, Alimta 500 mg/M2 and Keytruda 200 mg IV every 3 weeks.  Status post 1 cycle.  She tolerated her first cycle fairly well with the exception of weight loss.  The patient was seen with Dr. Earlie Server.  We reviewed her molecular studies with her and explained that there were no actionable mutations.  Recommend for her to proceed with cycle 2 of her treatment today as scheduled.  The patient will have weekly labs.  She will follow-up in 3 weeks for evaluation prior to cycle #3.  For the odynophagia, I have given her prescription for Carafate 10 cc before meals and at bedtime.  For the weight loss, I have referred her to the dietitian.  For pain management, she will continue on MS Contin and Percocet for breakthrough pain.  She was provided a refill of her MS Contin today.  She was advised to call immediately if she has any concerning symptoms in the interval. .The patient voices understanding of current disease status and treatment options and is in agreement with the current care plan.  All questions were answered. The patient knows to call the clinic with any problems, questions or concerns. We can certainly see the patient much sooner if necessary.   Orders Placed This Encounter  Procedures  . Amb Referral to Nutrition and Diabetic E    Referral Priority:   Routine    Referral Type:   Consultation    Referral Reason:   Specialty Services Required    Number of Visits Requested:   1     Mikey Bussing, DNP, AGPCNP-BC, AOCNP 05/02/18   ADDENDUM: Hematology/Oncology Attending: I had  a face-to-face encounter with the patient today.  I recommended her care plan.  This is a very pleasant 62 years old white female recently diagnosed with metastatic poorly differentiated neoplasm of unknown primary questionable lung.  She has no actionable mutations and the patient was a started on systemic chemotherapy with carboplatin, Alimta and Ketruda (pembrolizumab) status post 1 cycle.  She tolerated the first cycle of her treatment well except for mild fatigue. I recommended for her to proceed with cycle #2 today as scheduled. For the  odynophagia she will continue on Carafate. The patient will come back for follow-up visit in 3 weeks for evaluation before starting cycle #3. For the liver dysfunction, we will continue to monitor her liver enzymes closely on weekly blood work.  We will also monitor closely for any immunotherapy mediated hepatitis. The patient was advised to call immediately if she has any concerning symptoms in the interval. Disclaimer: This note was dictated with voice recognition software. Similar sounding words can inadvertently be transcribed and may be missed upon review. Eilleen Kempf, MD 05/02/18

## 2018-05-02 NOTE — Telephone Encounter (Signed)
Scheduled appt per 8/21 los - pt to get an updated schedule next visit.

## 2018-05-02 NOTE — Progress Notes (Signed)
Ok to treat with 05/02/18 CMP results per Mikey Bussing, NP.

## 2018-05-02 NOTE — Patient Instructions (Signed)
[place] Discharge Instructions for Patients Receiving Chemotherapy  Today you received the following chemotherapy agents Keytruda, Alimta, Carboplatin  To help prevent nausea and vomiting after your treatment, we encourage you to take your nausea medication as directed   If you develop nausea and vomiting that is not controlled by your nausea medication, call the clinic.   BELOW ARE SYMPTOMS THAT SHOULD BE REPORTED IMMEDIATELY:  *FEVER GREATER THAN 100.5 F  *CHILLS WITH OR WITHOUT FEVER  NAUSEA AND VOMITING THAT IS NOT CONTROLLED WITH YOUR NAUSEA MEDICATION  *UNUSUAL SHORTNESS OF BREATH  *UNUSUAL BRUISING OR BLEEDING  TENDERNESS IN MOUTH AND THROAT WITH OR WITHOUT PRESENCE OF ULCERS  *URINARY PROBLEMS  *BOWEL PROBLEMS  UNUSUAL RASH Items with * indicate a potential emergency and should be followed up as soon as possible.  Feel free to call the clinic should you have any questions or concerns. The clinic phone number is [phone].  Please show the [place] at check-in to the Emergency Department and triage nurse.

## 2018-05-02 NOTE — Assessment & Plan Note (Addendum)
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 PDL 1 expression was 99%. The molecular studies by [employer] as well as [employer] did not show any actionable mutations. The patient is currently on carboplatin for AUC of 5, Alimta 500 mg/M2 and Keytruda 200 mg IV every 3 weeks.  Status post 1 cycle.  She tolerated her first cycle fairly well with the exception of weight loss.  The patient was seen with Dr. Earlie Server.  We reviewed her molecular studies with her and explained that there were no actionable mutations.  Recommend for her to proceed with cycle 2 of her treatment today as scheduled.  The patient will have weekly labs.  She will follow-up in 3 weeks for evaluation prior to cycle #3.  For the odynophagia, I have given her prescription for Carafate 10 cc before meals and at bedtime.  For the weight loss, I have referred her to the dietitian.  For pain management, she will continue on MS Contin and Percocet for breakthrough pain.  She was provided a refill of her MS Contin today.  She was advised to call immediately if she has any concerning symptoms in the interval. .The patient voices understanding of current disease status and treatment options and is in agreement with the current care plan.  All questions were answered. The patient knows to call the clinic with any problems, questions or concerns. We can certainly see the patient much sooner if necessary.

## 2018-05-04 ENCOUNTER — Encounter: Payer: Self-pay | Admitting: *Deleted

## 2018-05-04 NOTE — Progress Notes (Signed)
Oncology Nurse Navigator Documentation  Oncology Nurse Navigator Flowsheets 05/04/2018  Navigator Location [facility]  Navigator Encounter Type Other/Rebecca Lucas has received a denial of payment from her insurance.  I contacted [employer] for help.  I was updated they will reach out to her insurance to get payment covered.  I have also help patient apply for financial assistance with [employer].   Treatment Phase Treatment  Barriers/Navigation Needs Financial  Interventions Other  Acuity Level 2  Time Spent with Patient 60

## 2018-05-08 ENCOUNTER — Telehealth: Payer: Self-pay | Admitting: *Deleted

## 2018-05-08 ENCOUNTER — Other Ambulatory Visit: Payer: Self-pay | Admitting: Medical Oncology

## 2018-05-08 DIAGNOSIS — R112 Nausea with vomiting, unspecified: Secondary | ICD-10-CM

## 2018-05-08 MED ORDER — ONDANSETRON HCL 8 MG PO TABS: 8.0000 mg | ORAL_TABLET | Freq: Three times a day (TID) | ORAL | 0 refills | Status: DC | PRN

## 2018-05-08 MED FILL — ONDANSETRON HCL 8 MG TABLET: 8 | 7 days supply | Qty: 20 | Fill #0

## 2018-05-08 MED FILL — LINZESS 72 MCG CAPSULE: 72 | 30 days supply | Qty: 30 | Fill #0

## 2018-05-08 NOTE — Telephone Encounter (Addendum)
"  Nauseated and vomiting since Sunday morning.  Take compazine but vomiting again when it wears off.  Large amounts, emptying stomach.  Clear or brown dependent on if any food was eaten.  Vomited today at 2:00 am; drank smoothie for breakfast; threw up again at 11:00 am.  Is there something better to use for the nausea?  I'm sipping Gatorade, water about 32 oz in.  Can tell a difference in energy level and how I feel but do not think I need to be seen today."

## 2018-05-08 NOTE — Telephone Encounter (Signed)
Per Dr Julien Nordmann I instructed pt to take compazine q 6 hours ATC . I also called in zofran prn .

## 2018-05-09 ENCOUNTER — Inpatient Hospital Stay: Payer: 59

## 2018-05-09 ENCOUNTER — Other Ambulatory Visit: Payer: Self-pay | Admitting: Medical Oncology

## 2018-05-09 ENCOUNTER — Telehealth: Payer: Self-pay | Admitting: Medical Oncology

## 2018-05-09 ENCOUNTER — Other Ambulatory Visit: Payer: Self-pay | Admitting: Internal Medicine

## 2018-05-09 DIAGNOSIS — C801 Malignant (primary) neoplasm, unspecified: Secondary | ICD-10-CM | POA: Diagnosis not present

## 2018-05-09 DIAGNOSIS — C3490 Malignant neoplasm of unspecified part of unspecified bronchus or lung: Secondary | ICD-10-CM

## 2018-05-09 DIAGNOSIS — Z5112 Encounter for antineoplastic immunotherapy: Secondary | ICD-10-CM | POA: Diagnosis not present

## 2018-05-09 DIAGNOSIS — C7951 Secondary malignant neoplasm of bone: Secondary | ICD-10-CM

## 2018-05-09 DIAGNOSIS — Z79899 Other long term (current) drug therapy: Secondary | ICD-10-CM | POA: Diagnosis not present

## 2018-05-09 DIAGNOSIS — R52 Pain, unspecified: Secondary | ICD-10-CM | POA: Diagnosis not present

## 2018-05-09 DIAGNOSIS — R1310 Dysphagia, unspecified: Secondary | ICD-10-CM | POA: Diagnosis not present

## 2018-05-09 DIAGNOSIS — R634 Abnormal weight loss: Secondary | ICD-10-CM | POA: Diagnosis not present

## 2018-05-09 LAB — CMP (CANCER CENTER ONLY)
ALBUMIN: 2.9 g/dL — AB (ref 3.5–5.0)
ALT: 189 U/L — ABNORMAL HIGH (ref 0–44)
ANION GAP: 6 (ref 5–15)
AST: 122 U/L — AB (ref 15–41)
Alkaline Phosphatase: 151 U/L — ABNORMAL HIGH (ref 38–126)
BUN: 15 mg/dL (ref 6–20)
CHLORIDE: 100 mmol/L (ref 98–111)
CO2: 28 mmol/L (ref 22–32)
Calcium: 9 mg/dL (ref 8.9–10.3)
Creatinine: 0.61 mg/dL (ref 0.44–1.00)
GFR, Est AFR Am: >60 mL/min (ref >60)
GFR, Estimated: >60 mL/min (ref >60)
Glucose, Bld: 97 mg/dL (ref 70–99)
POTASSIUM: 4.2 mmol/L (ref 3.5–5.1)
SODIUM: 134 mmol/L — AB (ref 135–145)
Total Bilirubin: 0.2 mg/dL — ABNORMAL LOW (ref 0.3–1.2)
Total Protein: 6.8 g/dL (ref 6.5–8.1)

## 2018-05-09 LAB — CBC WITH DIFFERENTIAL (CANCER CENTER ONLY)
BASOS ABS: 0 10*3/uL (ref 0.0–0.1)
Basophils Relative: 2 %
EOS PCT: 3 %
Eosinophils Absolute: 0 10*3/uL (ref 0.0–0.5)
HEMATOCRIT: 26.7 % — AB (ref 34.8–46.6)
Hemoglobin: 8.8 g/dL — ABNORMAL LOW (ref 11.6–15.9)
LYMPHS ABS: 0.3 10*3/uL — AB (ref 0.9–3.3)
LYMPHS PCT: 22 %
MCH: 27.1 pg (ref 25.1–34.0)
MCHC: 32.8 g/dL (ref 31.5–36.0)
MCV: 82.5 fL (ref 79.5–101.0)
MONO ABS: 0.3 10*3/uL (ref 0.1–0.9)
MONOS PCT: 19 %
NEUTROS ABS: 0.9 10*3/uL — AB (ref 1.5–6.5)
Neutrophils Relative %: 54 %
Platelet Count: 418 10*3/uL — ABNORMAL HIGH (ref 145–400)
RBC: 3.24 MIL/uL — ABNORMAL LOW (ref 3.70–5.45)
RDW: 16.9 % — AB (ref 11.2–14.5)
WBC Count: 1.6 10*3/uL — ABNORMAL LOW (ref 3.9–10.3)

## 2018-05-09 NOTE — Telephone Encounter (Signed)
F/u Rebecca Lucas AST/ALT-nausea better today since she is taking compazine ATC.Did throw up once this am. Pt states she takes 2 tylenol /week. Per Julien Nordmann he ordered stat CT C/A/P.I called pt and gave her appt for CT scan tomorrow at 0845, NPO after Midnight and to pick up contrast tonight at radiology -drink one bottle 2 hours and one bottle 1 hour before scan. She noted she may not be able to keep down contrast . She will try.

## 2018-05-10 ENCOUNTER — Ambulatory Visit (HOSPITAL_COMMUNITY)
Admission: RE | Admit: 2018-05-10 | Discharge: 2018-05-10 | Disposition: A | Discharge status: Home / Self Care | Payer: 59 | Source: Ambulatory Visit | Attending: Internal Medicine | Admitting: Internal Medicine

## 2018-05-10 DIAGNOSIS — C7972 Secondary malignant neoplasm of left adrenal gland: Secondary | ICD-10-CM | POA: Diagnosis not present

## 2018-05-10 DIAGNOSIS — C3490 Malignant neoplasm of unspecified part of unspecified bronchus or lung: Secondary | ICD-10-CM | POA: Insufficient documentation

## 2018-05-10 DIAGNOSIS — C50919 Malignant neoplasm of unspecified site of unspecified female breast: Secondary | ICD-10-CM | POA: Diagnosis not present

## 2018-05-10 DIAGNOSIS — C7951 Secondary malignant neoplasm of bone: Secondary | ICD-10-CM | POA: Diagnosis not present

## 2018-05-10 DIAGNOSIS — C786 Secondary malignant neoplasm of retroperitoneum and peritoneum: Secondary | ICD-10-CM | POA: Insufficient documentation

## 2018-05-10 DIAGNOSIS — R590 Localized enlarged lymph nodes: Secondary | ICD-10-CM | POA: Diagnosis not present

## 2018-05-10 MED ORDER — IOHEXOL 300 MG/ML  SOLN
100.0000 mL | Freq: Once | INTRAMUSCULAR | Status: AC | PRN
  Administered 2018-05-10: 100 mL via INTRAVENOUS

## 2018-05-15 ENCOUNTER — Inpatient Hospital Stay: Payer: 59 | Attending: Internal Medicine

## 2018-05-15 DIAGNOSIS — C786 Secondary malignant neoplasm of retroperitoneum and peritoneum: Secondary | ICD-10-CM | POA: Insufficient documentation

## 2018-05-15 DIAGNOSIS — Z5112 Encounter for antineoplastic immunotherapy: Secondary | ICD-10-CM | POA: Diagnosis not present

## 2018-05-15 DIAGNOSIS — C7972 Secondary malignant neoplasm of left adrenal gland: Secondary | ICD-10-CM | POA: Diagnosis not present

## 2018-05-15 DIAGNOSIS — C7951 Secondary malignant neoplasm of bone: Secondary | ICD-10-CM | POA: Diagnosis not present

## 2018-05-15 DIAGNOSIS — Z5111 Encounter for antineoplastic chemotherapy: Secondary | ICD-10-CM | POA: Diagnosis not present

## 2018-05-15 DIAGNOSIS — Z7982 Long term (current) use of aspirin: Secondary | ICD-10-CM | POA: Diagnosis not present

## 2018-05-15 DIAGNOSIS — C7801 Secondary malignant neoplasm of right lung: Secondary | ICD-10-CM | POA: Diagnosis not present

## 2018-05-15 DIAGNOSIS — I700 Atherosclerosis of aorta: Secondary | ICD-10-CM | POA: Insufficient documentation

## 2018-05-15 LAB — CBC WITH DIFFERENTIAL (CANCER CENTER ONLY)
BASOS PCT: 0 %
Basophils Absolute: 0 10*3/uL (ref 0.0–0.1)
EOS ABS: 0.1 10*3/uL (ref 0.0–0.5)
Eosinophils Relative: 2 %
HCT: 29.9 % — ABNORMAL LOW (ref 34.8–46.6)
HEMOGLOBIN: 9.3 g/dL — AB (ref 11.6–15.9)
Lymphocytes Relative: 7 %
Lymphs Abs: 0.3 10*3/uL — ABNORMAL LOW (ref 0.9–3.3)
MCH: 27 pg (ref 25.1–34.0)
MCHC: 31.1 g/dL — AB (ref 31.5–36.0)
MCV: 86.9 fL (ref 79.5–101.0)
MONOS PCT: 15 %
Monocytes Absolute: 0.7 10*3/uL (ref 0.1–0.9)
NEUTROS PCT: 76 %
Neutro Abs: 3.4 10*3/uL (ref 1.5–6.5)
Platelet Count: 337 10*3/uL (ref 145–400)
RBC: 3.44 MIL/uL — ABNORMAL LOW (ref 3.70–5.45)
RDW: 19 % — AB (ref 11.2–14.5)
WBC Count: 4.5 10*3/uL (ref 3.9–10.3)

## 2018-05-15 LAB — CMP (CANCER CENTER ONLY)
ALK PHOS: 117 U/L (ref 38–126)
ALT: 85 U/L — ABNORMAL HIGH (ref 0–44)
AST: 57 U/L — ABNORMAL HIGH (ref 15–41)
Albumin: 3.1 g/dL — ABNORMAL LOW (ref 3.5–5.0)
Anion gap: 8 (ref 5–15)
BILIRUBIN TOTAL: 0.2 mg/dL — AB (ref 0.3–1.2)
BUN: 12 mg/dL (ref 6–20)
CALCIUM: 9.7 mg/dL (ref 8.9–10.3)
CO2: 27 mmol/L (ref 22–32)
CREATININE: 0.68 mg/dL (ref 0.44–1.00)
Chloride: 103 mmol/L (ref 98–111)
Glucose, Bld: 92 mg/dL (ref 70–99)
Potassium: 4 mmol/L (ref 3.5–5.1)
Sodium: 138 mmol/L (ref 135–145)
TOTAL PROTEIN: 7.1 g/dL (ref 6.5–8.1)

## 2018-05-16 ENCOUNTER — Other Ambulatory Visit: Payer: 59

## 2018-05-22 ENCOUNTER — Inpatient Hospital Stay: Payer: 59

## 2018-05-22 ENCOUNTER — Encounter: Payer: Self-pay | Admitting: Internal Medicine

## 2018-05-22 ENCOUNTER — Inpatient Hospital Stay (HOSPITAL_BASED_OUTPATIENT_CLINIC_OR_DEPARTMENT_OTHER): Payer: 59 | Admitting: Internal Medicine

## 2018-05-22 ENCOUNTER — Telehealth: Payer: Self-pay | Admitting: Internal Medicine

## 2018-05-22 VITALS — BP 124/66 | HR 92 | Temp 98.5°F | Resp 18 | Ht 67.0 in | Wt 117.3 lb

## 2018-05-22 DIAGNOSIS — C7972 Secondary malignant neoplasm of left adrenal gland: Secondary | ICD-10-CM | POA: Diagnosis not present

## 2018-05-22 DIAGNOSIS — C801 Malignant (primary) neoplasm, unspecified: Secondary | ICD-10-CM | POA: Diagnosis not present

## 2018-05-22 DIAGNOSIS — C7951 Secondary malignant neoplasm of bone: Secondary | ICD-10-CM | POA: Diagnosis not present

## 2018-05-22 DIAGNOSIS — Z5112 Encounter for antineoplastic immunotherapy: Secondary | ICD-10-CM

## 2018-05-22 DIAGNOSIS — C7801 Secondary malignant neoplasm of right lung: Secondary | ICD-10-CM | POA: Diagnosis not present

## 2018-05-22 DIAGNOSIS — Z5111 Encounter for antineoplastic chemotherapy: Secondary | ICD-10-CM

## 2018-05-22 DIAGNOSIS — G893 Neoplasm related pain (acute) (chronic): Secondary | ICD-10-CM | POA: Diagnosis not present

## 2018-05-22 DIAGNOSIS — C786 Secondary malignant neoplasm of retroperitoneum and peritoneum: Secondary | ICD-10-CM | POA: Diagnosis not present

## 2018-05-22 DIAGNOSIS — I700 Atherosclerosis of aorta: Secondary | ICD-10-CM | POA: Diagnosis not present

## 2018-05-22 DIAGNOSIS — Z7982 Long term (current) use of aspirin: Secondary | ICD-10-CM | POA: Diagnosis not present

## 2018-05-22 LAB — CBC WITH DIFFERENTIAL (CANCER CENTER ONLY)
Basophils Absolute: 0 10*3/uL (ref 0.0–0.1)
Basophils Relative: 0 %
Eosinophils Absolute: 0.1 10*3/uL (ref 0.0–0.5)
Eosinophils Relative: 1 %
HCT: 30.9 % — ABNORMAL LOW (ref 34.8–46.6)
HEMOGLOBIN: 10.2 g/dL — AB (ref 11.6–15.9)
Lymphocytes Relative: 6 %
Lymphs Abs: 0.3 10*3/uL — ABNORMAL LOW (ref 0.9–3.3)
MCH: 28.5 pg (ref 25.1–34.0)
MCHC: 33 g/dL (ref 31.5–36.0)
MCV: 86.3 fL (ref 79.5–101.0)
MONOS PCT: 10 %
Monocytes Absolute: 0.5 10*3/uL (ref 0.1–0.9)
Neutro Abs: 3.8 10*3/uL (ref 1.5–6.5)
Neutrophils Relative %: 83 %
Platelet Count: 419 10*3/uL — ABNORMAL HIGH (ref 145–400)
RBC: 3.57 MIL/uL — ABNORMAL LOW (ref 3.70–5.45)
RDW: 22.8 % — AB (ref 11.2–14.5)
WBC Count: 4.6 10*3/uL (ref 3.9–10.3)

## 2018-05-22 LAB — CMP (CANCER CENTER ONLY)
ALK PHOS: 103 U/L (ref 38–126)
ALT: 85 U/L — AB (ref 0–44)
AST: 78 U/L — AB (ref 15–41)
Albumin: 3.3 g/dL — ABNORMAL LOW (ref 3.5–5.0)
Anion gap: 9 (ref 5–15)
BUN: 12 mg/dL (ref 6–20)
CALCIUM: 9.8 mg/dL (ref 8.9–10.3)
CO2: 26 mmol/L (ref 22–32)
CREATININE: 0.72 mg/dL (ref 0.44–1.00)
Chloride: 104 mmol/L (ref 98–111)
GFR, Est AFR Am: >60 mL/min (ref >60)
Glucose, Bld: 107 mg/dL — ABNORMAL HIGH (ref 70–99)
Potassium: 3.9 mmol/L (ref 3.5–5.1)
Sodium: 139 mmol/L (ref 135–145)
Total Protein: 7.3 g/dL (ref 6.5–8.1)

## 2018-05-22 LAB — TSH: TSH: 1.226 u[IU]/mL (ref 0.308–3.960)

## 2018-05-22 MED ORDER — SODIUM CHLORIDE 0.9 % IV SOLN
Freq: Once | INTRAVENOUS | Status: AC
  Administered 2018-05-22: 13:00:00 via INTRAVENOUS
  Filled 2018-05-22: qty 5

## 2018-05-22 MED ORDER — SODIUM CHLORIDE 0.9 % IV SOLN
200.0000 mg | Freq: Once | INTRAVENOUS | Status: AC
  Administered 2018-05-22: 200 mg via INTRAVENOUS
  Filled 2018-05-22: qty 8

## 2018-05-22 MED ORDER — PALONOSETRON HCL INJECTION 0.25 MG/5ML
0.2500 mg | Freq: Once | INTRAVENOUS | Status: AC
  Administered 2018-05-22: 0.25 mg via INTRAVENOUS

## 2018-05-22 MED ORDER — SODIUM CHLORIDE 0.9 % IV SOLN
490.0000 mg/m2 | Freq: Once | INTRAVENOUS | Status: AC
  Administered 2018-05-22: 800 mg via INTRAVENOUS
  Filled 2018-05-22: qty 20

## 2018-05-22 MED ORDER — PALONOSETRON HCL INJECTION 0.25 MG/5ML
INTRAVENOUS | Status: AC
  Filled 2018-05-22: qty 5

## 2018-05-22 MED ORDER — SODIUM CHLORIDE 0.9 % IV SOLN
462.0000 mg | Freq: Once | INTRAVENOUS | Status: AC
  Administered 2018-05-22: 460 mg via INTRAVENOUS
  Filled 2018-05-22: qty 46

## 2018-05-22 MED ORDER — SODIUM CHLORIDE 0.9 % IV SOLN
Freq: Once | INTRAVENOUS | Status: AC
  Administered 2018-05-22: 12:00:00 via INTRAVENOUS
  Filled 2018-05-22: qty 250

## 2018-05-22 NOTE — Progress Notes (Signed)
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  Metastatic sarcoma cannot be also excluded at this point. Molecular studies by foundation 1 showed no actionable mutations. PDL 1 expression 99%  PRIOR THERAPY:Palliative radiotherapy to the neck mass and hip lesion under the care of Dr. Tammi Klippel   CURRENT THERAPY: Systemic chemotherapy with carboplatin for AUC of 5, Alimta 500 mg/M2 and Keytruda 200 mg IV every 3 weeks.  First dose expected April 10, 2018.  Status post 2 cycles.  INTERVAL HISTORY: Rebecca Lucas 62 y.o. female returns to the clinic today for follow-up visit.  The patient is feeling much better today with no concerning complaints.  Her pain is much better controlled and she would like to cut on the dose of morphine sulfate.  She denied having any current chest pain, shortness of breath, cough or hemoptysis.  She denied having any fever or chills.  She has mild nausea with no vomiting, diarrhea or constipation.  She denied having any recent weight loss or night sweats.  She had repeat CT scan of the chest, abdomen and pelvis performed recently and she is here today for evaluation and discussion of her scan results before starting cycle #3.  MEDICAL HISTORY:No past medical history on file.  ALLERGIES:  has No Known Allergies.  MEDICATIONS:  Current Outpatient Medications  Medication Sig Dispense Refill  . acetaminophen (TYLENOL) 500 MG tablet Take 1,000 mg by mouth every 6 (six) hours as needed for mild pain.     Marland Kitchen alendronate (FOSAMAX) 70 MG tablet Take 70 mg by mouth once a week. Wednesday  3  . butalbital-aspirin-caffeine  (FIORINAL) 50-325-40 MG capsule Take 1 capsule by mouth every 6 (six) hours as needed for migraine.   1  . Calcium Carbonate-Vitamin D (CALCIUM-D PO) Take 1 tablet by mouth 2 (two) times daily.    . fluconazole (DIFLUCAN) 200 MG tablet Take 1 tablet (200 mg total) by mouth daily. 5 tablet 0  . folic acid (FOLVITE) 1 MG tablet Take 1 tablet (1 mg total) by mouth daily. 30 tablet 4  . ibuprofen (ADVIL,MOTRIN) 200 MG tablet Take 600 mg by mouth every 6 (six) hours as needed for mild pain.     Marland Kitchen linaclotide (LINZESS) 72 MCG capsule Take 1 capsule (72 mcg total) by mouth daily before breakfast. 30 capsule 2  . lubiprostone (AMITIZA) 24 MCG capsule Take 1 capsule (24 mcg total) by mouth 2 (two) times daily with a meal. 60 capsule 2  . LUTEIN PO Take 1 tablet by mouth daily.    . magic mouthwash SOLN Take 5 mLs by mouth 4 (four) times daily as needed for mouth pain. 240 mL 2  . morphine (MS CONTIN) 30 MG 12 hr tablet Take 1 tablet (30 mg total) by mouth every 12 (twelve) hours. 60 tablet 0  . ondansetron (ZOFRAN) 8 MG tablet Take 1 tablet (8 mg total) by mouth every 8 (eight) hours as needed for nausea or vomiting. 20 tablet 0  . oxyCODONE-acetaminophen (PERCOCET/ROXICET) 5-325 MG tablet Take 1 tablet by mouth every 6 (six) hours as needed for severe pain. 60 tablet 0  . prochlorperazine (COMPAZINE) 10 MG tablet Take  1 tablet (10 mg total) by mouth every 6 (six) hours as needed for nausea or vomiting. 30 tablet 0  . sucralfate (CARAFATE) 1 GM/10ML suspension Take 10 mLs (1 g total) by mouth 4 (four) times daily -  with meals and at bedtime. 420 mL 0  . traMADol (ULTRAM) 50 MG tablet Take 50 mg by mouth 4 (four) times daily as needed (pain).   0  . zolpidem (AMBIEN) 10 MG tablet Take 1 tablet (10 mg total) by mouth at bedtime as needed for sleep. 30 tablet 0   No current facility-administered medications for this visit.     SURGICAL HISTORY: No past surgical history on file.  REVIEW OF SYSTEMS:   Constitutional: positive for fatigue Eyes: negative Ears, nose, mouth, throat, and face: negative Respiratory: negative Cardiovascular: negative Gastrointestinal: positive for nausea Genitourinary:negative Integument/breast: negative Hematologic/lymphatic: negative Musculoskeletal:negative Neurological: negative Behavioral/Psych: negative Endocrine: negative Allergic/Immunologic: negative   PHYSICAL EXAMINATION: General appearance: alert, cooperative, fatigued and no distress Head: Normocephalic, without obvious abnormality, atraumatic Neck: no adenopathy, no JVD, supple, symmetrical, trachea midline and thyroid not enlarged, symmetric, no tenderness/mass/nodules Lymph nodes: Cervical, supraclavicular, and axillary nodes normal. Resp: clear to auscultation bilaterally Back: symmetric, no curvature. ROM normal. No CVA tenderness. Cardio: regular rate and rhythm, S1, S2 normal, no murmur, click, rub or gallop GI: soft, non-tender; bowel sounds normal; no masses,  no organomegaly Extremities: extremities normal, atraumatic, no cyanosis or edema Neurologic: Alert and oriented X 3, normal strength and tone. Normal symmetric reflexes. Normal coordination and gait  ECOG PERFORMANCE STATUS: 1 - Symptomatic but completely ambulatory  Blood pressure 124/66, pulse 92, temperature 98.5 F (36.9 C), temperature source Oral, resp. rate 18, height 5\' 7"  (1.702 m), weight 117 lb 4.8 oz (53.2 kg), SpO2 99 %.  LABORATORY DATA: Lab Results  Component Value Date   WBC 4.6 05/22/2018   HGB 10.2 (L) 05/22/2018   HCT 30.9 (L) 05/22/2018   MCV 86.3 05/22/2018   PLT 419 (H) 05/22/2018      Chemistry      Component Value Date/Time   NA 139 05/22/2018 0922   K 3.9 05/22/2018 0922   CL 104 05/22/2018 0922   CO2 26 05/22/2018 0922   BUN 12 05/22/2018 0922   CREATININE 0.72 05/22/2018 0922      Component Value Date/Time   CALCIUM 9.8 05/22/2018 0922   ALKPHOS 103 05/22/2018 0922   AST 78 (H)  05/22/2018 0922   ALT 85 (H) 05/22/2018 0922   BILITOT <0.2 (L) 05/22/2018 0922       RADIOGRAPHIC STUDIES: Ct Chest W Contrast  Result Date: 05/10/2018 CLINICAL DATA:  Lung cancer.  Restaging EXAM: CT CHEST, ABDOMEN, AND PELVIS WITH CONTRAST TECHNIQUE: Multidetector CT imaging of the chest, abdomen and pelvis was performed following the standard protocol during bolus administration of intravenous contrast. CONTRAST:  170mL OMNIPAQUE IOHEXOL 300 MG/ML  SOLN COMPARISON:  03/05/2018 FINDINGS: CT CHEST FINDINGS Cardiovascular: The heart size appears within normal limits. No pericardial effusion. Aortic atherosclerosis. Mediastinum/Nodes: Normal appearance of the thyroid gland. The trachea appears patent and is midline. Right paratracheal node measures 9 mm, image 22/2. Previously 1.3 cm. No hilar adenopathy. No enlarged supraclavicular or axillary lymph nodes. Lungs/Pleura: There is no pleural effusion identified. The right upper lobe lung mass has decreased in size in the interval. Currently this measures 4.8 x 3.4 cm, image 41/4. Previously 6.3 x 6.3 cm, image 41/4. No new pulmonary nodules or masses. Musculoskeletal: Permeative lesion involving the left 6  rib is identified is new compared with the previous exam, image 20/6 measuring 1.7 cm. There is a new fracture involving the lateral aspect of the right fourth rib. CT ABDOMEN PELVIS FINDINGS Hepatobiliary: No focal liver abnormality. The gallbladder appears normal. No biliary dilatation. Pancreas: Unremarkable. No pancreatic ductal dilatation or surrounding inflammatory changes. Spleen: Tiny nonspecific low-density structure within the spleen measures 5 mm. Spleen is otherwise normal in appearance and size. Adrenals/Urinary Tract: Normal appearance of the right adrenal gland. Left adrenal gland metastasis measures 2.1 by 1.1 cm, image 54/2. Previously 2.3 x 1.5 cm. The kidneys are unremarkable. No mass or hydronephrosis. Urinary bladder appears normal  for degree of distention. Stomach/Bowel: The stomach is normal. The small bowel loops have a normal course and caliber without obstruction. No pathologic dilatation of the colon. Vascular/Lymphatic: Normal appearance of the abdominal aorta. No aneurysm. No adenopathy within the abdomen or pelvis. No inguinal adenopathy. Reproductive: Uterus and bilateral adnexa are unremarkable. Other: The index lesion posterior to the left psoas muscle is again noted. On today's exam this measures 2.2 x 0.9 cm. Previously 2.4 x 1.7 cm. Musculoskeletal: Previous hypermetabolic lesion involving the right sacral wing measures 2.7 x 3.0 cm, image 88/2. Previously 1.3 x 2.0 cm. Enlargement of the right posterior left iliac bone lesion which measures 2.9 by 1.9 cm with new cortical erosion. This was not readily apparent on previous CT images from 03/05/18 PET-CT. New lytic lesion involving the right iliac bone measuring 1.3 cm. Increase in size of lytic lesion involving the right ischial rami which measures 2.2 cm versus 1.3 cm previously, image 116/2. There has been interval fracture of previous hypermetabolic L2 metastasis. IMPRESSION: 1. Interval mixed response to therapy. There has been decrease in size of the primary right upper lobe lung lesion. Mediastinal adenopathy has also improved. Mild decrease in size of left adrenal gland metastasis and left retroperitoneal metastasis. 2. Progression of lytic bone metastases. Electronically Signed   By: Kerby Moors M.D.   On: 05/10/2018 10:51   Ct Abdomen Pelvis W Contrast  Result Date: 05/10/2018 CLINICAL DATA:  Lung cancer.  Restaging EXAM: CT CHEST, ABDOMEN, AND PELVIS WITH CONTRAST TECHNIQUE: Multidetector CT imaging of the chest, abdomen and pelvis was performed following the standard protocol during bolus administration of intravenous contrast. CONTRAST:  143mL OMNIPAQUE IOHEXOL 300 MG/ML  SOLN COMPARISON:  03/05/2018 FINDINGS: CT CHEST FINDINGS Cardiovascular: The heart size  appears within normal limits. No pericardial effusion. Aortic atherosclerosis. Mediastinum/Nodes: Normal appearance of the thyroid gland. The trachea appears patent and is midline. Right paratracheal node measures 9 mm, image 22/2. Previously 1.3 cm. No hilar adenopathy. No enlarged supraclavicular or axillary lymph nodes. Lungs/Pleura: There is no pleural effusion identified. The right upper lobe lung mass has decreased in size in the interval. Currently this measures 4.8 x 3.4 cm, image 41/4. Previously 6.3 x 6.3 cm, image 41/4. No new pulmonary nodules or masses. Musculoskeletal: Permeative lesion involving the left 6 rib is identified is new compared with the previous exam, image 20/6 measuring 1.7 cm. There is a new fracture involving the lateral aspect of the right fourth rib. CT ABDOMEN PELVIS FINDINGS Hepatobiliary: No focal liver abnormality. The gallbladder appears normal. No biliary dilatation. Pancreas: Unremarkable. No pancreatic ductal dilatation or surrounding inflammatory changes. Spleen: Tiny nonspecific low-density structure within the spleen measures 5 mm. Spleen is otherwise normal in appearance and size. Adrenals/Urinary Tract: Normal appearance of the right adrenal gland. Left adrenal gland metastasis measures 2.1 by 1.1 cm, image  54/2. Previously 2.3 x 1.5 cm. The kidneys are unremarkable. No mass or hydronephrosis. Urinary bladder appears normal for degree of distention. Stomach/Bowel: The stomach is normal. The small bowel loops have a normal course and caliber without obstruction. No pathologic dilatation of the colon. Vascular/Lymphatic: Normal appearance of the abdominal aorta. No aneurysm. No adenopathy within the abdomen or pelvis. No inguinal adenopathy. Reproductive: Uterus and bilateral adnexa are unremarkable. Other: The index lesion posterior to the left psoas muscle is again noted. On today's exam this measures 2.2 x 0.9 cm. Previously 2.4 x 1.7 cm. Musculoskeletal: Previous  hypermetabolic lesion involving the right sacral wing measures 2.7 x 3.0 cm, image 88/2. Previously 1.3 x 2.0 cm. Enlargement of the right posterior left iliac bone lesion which measures 2.9 by 1.9 cm with new cortical erosion. This was not readily apparent on previous CT images from 03/05/18 PET-CT. New lytic lesion involving the right iliac bone measuring 1.3 cm. Increase in size of lytic lesion involving the right ischial rami which measures 2.2 cm versus 1.3 cm previously, image 116/2. There has been interval fracture of previous hypermetabolic L2 metastasis. IMPRESSION: 1. Interval mixed response to therapy. There has been decrease in size of the primary right upper lobe lung lesion. Mediastinal adenopathy has also improved. Mild decrease in size of left adrenal gland metastasis and left retroperitoneal metastasis. 2. Progression of lytic bone metastases. Electronically Signed   By: Kerby Moors M.D.   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 PDL 1 expression was 99%. The molecular studies by foundation 1 as well as [employer] showed no actionable mutations. The patient was a started on treatment with systemic chemotherapy with carboplatin for AUC of 5, Alimta 500 mg/M2 and Keytruda 200 mg IV every 3 weeks.  She is status post 2 cycles.  She has been tolerating this treatment fairly well and she symptomatically felt much better. She had repeat CT scan of the chest, abdomen and pelvis performed recently. I personally and independently reviewed the scans and discussed the results with the patient today.  Her scan showed mixed response with decrease in  the size of the primary lung lesion as well as mediastinal lymphadenopathy and decrease in the size of the left adrenal gland metastasis and left retroperitoneal metastasis but there was suspicious disease progression in the bone which could be treatment related. I recommended for the patient to continue her current treatment with carboplatin, Alimta and Keytruda and she will proceed with cycle #3 today. We will see her back for follow-up visit in 3 weeks for evaluation before starting cycle #4. For pain management we will decrease her dose of MS Contin to 15 mg p.o. every 12 hours and the patient will continue on Percocet on as-needed basis for breakthrough pain. She was advised to call immediately if she has any concerning symptoms in the interval. .The patient voices understanding of current disease status and treatment options and is in agreement with the current care plan.  All questions were answered. The patient knows to call the clinic with any problems, questions or concerns. We can certainly see the patient much sooner if necessary.  Disclaimer: This  note was dictated with voice recognition software. Similar sounding words can inadvertently be transcribed and may not be corrected upon review.

## 2018-05-22 NOTE — Patient Instructions (Signed)
[facility] Discharge Instructions for Patients Receiving Chemotherapy  Today you received the following chemotherapy agents: Pembrolizumab (Keytruda), Pemetrexed (Alimta), and Carboplatin (Paraplatin)  To help prevent nausea and vomiting after your treatment, we encourage you to take your nausea medication as directed. Received Aloxi during treatment today-->Take Compazine (not Zofran) for the next 3 days as needed.    If you develop nausea and vomiting that is not controlled by your nausea medication, call the clinic.   BELOW ARE SYMPTOMS THAT SHOULD BE REPORTED IMMEDIATELY:  *FEVER GREATER THAN 100.5 F  *CHILLS WITH OR WITHOUT FEVER  NAUSEA AND VOMITING THAT IS NOT CONTROLLED WITH YOUR NAUSEA MEDICATION  *UNUSUAL SHORTNESS OF BREATH  *UNUSUAL BRUISING OR BLEEDING  TENDERNESS IN MOUTH AND THROAT WITH OR WITHOUT PRESENCE OF ULCERS  *URINARY PROBLEMS  *BOWEL PROBLEMS  UNUSUAL RASH Items with * indicate a potential emergency and should be followed up as soon as possible.  Feel free to call the clinic should you have any questions or concerns. The clinic phone number is [phone].  Please show the [facility] at check-in to the Emergency Department and triage nurse.

## 2018-05-22 NOTE — Progress Notes (Signed)
Per Dr. Earlie Server: OK to treat with elevated AST and ALT

## 2018-05-22 NOTE — Progress Notes (Signed)
Nutrition Assessment   Reason for Assessment:   Referral from Kristin Curcio, NP for weight loss.   ASSESSMENT:   62 year old female with metastatic poorly differentiated malignant neoplasm of unknown primary at this time but likely non-small cell carcinoma of lung per MD note. Patient receiving chemotherapy and palliative radiation therapy to neck and hip lesions.   Met with patient during infusion this am.  Patient reports initially had no appetite then nausea/vomiting issues that have resolved per patient.  Patient reports that she is drinking oral nutrition shake ([employer]) with bagel and cream cheese for breakfast, smoothie made with milk, yogurt and fruit for lunch and dinner is usually meat, vegetables and starch.  Reports dinner portions are back up to normal.     Nutrition Focused Physical Exam: deferred in infusion room   Medications: Calcium and Vit D, diflucan, folic acid, linzess, magic mouthwash, compazine, zofran, carafate   Labs: reviewed   Anthropometrics:   Height: 67 inches Weight: 117 lb today UBW: 132 lb Noted 131 lb on 03/08/18 BMI: 18  11% weight loss in the last 3 months, significant   Estimated Energy Needs  Kcals: 1600-1800 calories/d Protein: 80-90 g/d Fluid: 1.8 L/d   NUTRITION DIAGNOSIS: Inadequate oral intake related to poor appetite, cancer treatment related side effects as evidenced by 11% weight loss in 3 months   INTERVENTION:  Discussed strategies to increase calories and protein.  Handout provided.  Encouraged oral nutrition supplements. Would encouraged high calorie shakes. Also discussed strategies to help with nausea.  Handout provided as well.  Contact information given.    MONITORING, EVALUATION, GOAL: Patient will consume adequate calories and protein to prevent further weight loss   Next Visit: Wednesday, October 23 during infusion   B. , RD, LDN Registered Dietitian [phone] (pager)        

## 2018-05-22 NOTE — Telephone Encounter (Signed)
Scheduled appt per 9/10 los - pt to get an updated schedule next visit.

## 2018-05-22 NOTE — Progress Notes (Signed)
Infiltration while flushing after alimta.  Assessed by RN x2, swelling with coolness and stinging, no redness at site.  NP S Johnson at chairside to assess.  0.5 ml aspirated from IV then removed with catheter intact.  Pt to return tomorrow for follow up on site.

## 2018-05-23 ENCOUNTER — Ambulatory Visit: Payer: 59 | Admitting: Urology

## 2018-05-24 ENCOUNTER — Other Ambulatory Visit: Payer: Self-pay

## 2018-05-24 ENCOUNTER — Encounter: Payer: Self-pay | Admitting: Urology

## 2018-05-24 ENCOUNTER — Ambulatory Visit
Admission: RE | Admit: 2018-05-24 | Discharge: 2018-05-24 | Disposition: A | Discharge status: Home / Self Care | Payer: 59 | Source: Ambulatory Visit | Attending: Urology | Admitting: Urology

## 2018-05-24 VITALS — BP 104/54 | HR 80 | Temp 98.9°F | Wt 121.6 lb

## 2018-05-24 DIAGNOSIS — C3411 Malignant neoplasm of upper lobe, right bronchus or lung: Secondary | ICD-10-CM | POA: Diagnosis not present

## 2018-05-24 DIAGNOSIS — Z923 Personal history of irradiation: Secondary | ICD-10-CM | POA: Insufficient documentation

## 2018-05-24 DIAGNOSIS — C7951 Secondary malignant neoplasm of bone: Secondary | ICD-10-CM | POA: Diagnosis not present

## 2018-05-24 DIAGNOSIS — Z79899 Other long term (current) drug therapy: Secondary | ICD-10-CM | POA: Diagnosis not present

## 2018-05-24 DIAGNOSIS — C778 Secondary and unspecified malignant neoplasm of lymph nodes of multiple regions: Secondary | ICD-10-CM | POA: Insufficient documentation

## 2018-05-24 DIAGNOSIS — C799 Secondary malignant neoplasm of unspecified site: Secondary | ICD-10-CM

## 2018-05-24 DIAGNOSIS — C7972 Secondary malignant neoplasm of left adrenal gland: Secondary | ICD-10-CM | POA: Diagnosis not present

## 2018-05-24 DIAGNOSIS — I700 Atherosclerosis of aorta: Secondary | ICD-10-CM | POA: Insufficient documentation

## 2018-05-24 NOTE — Progress Notes (Signed)
Pt returned for 48 hr assessment of IV site that infiltrated (forgot to return for 24 hr assessment). Site appears smooth, uniform in color, and soft/non-tender. Pt denies any concerns/issues since incident. Encouraged to call clinic should any problems/symptoms arise later--pt verbalized understanding/agreement.

## 2018-05-24 NOTE — Progress Notes (Signed)
Radiation Oncology         [phone] ________________________________  Name: Rebecca Lucas MRN: [id number]  Date: 05/24/2018  DOB: 1956-03-20  Post Treatment Note  CC: Donald Prose, MD  Curt Bears, MD  Diagnosis:   62 y.o. 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  Interval Since Last Radiation:  4 weeks  03/29/18 - 04/20/18 : 1. 30 Gy directed to the RIGHT hip delivered in 10 fractions 2. 30 Gy directed to the RIGHT lung delivered in 10 fractions  03/21/2018, 03/23/2018, 03/26/2018, 03/28/2018, 03/30/2018:   The cervical spine (C2-3) was treated to 50 Gy in 5 fractions of 10 Gy.  Narrative:  The patient returns today for routine follow-up.   She tolerated radiation treatment relatively well and without significant acute effects.                          On review of systems, the patient states that she is doing very well overall.  She is currently without complaints and specifically denies productive cough, hemoptysis, shortness of breath, chest pain, fever, chills, nausea, vomiting, diarrhea or constipation.  She has had significant improvement in the neck and right hip pain since completion of radiotherapy.    She has continued on systemic chemotherapy with carboplatin, Alimta 500 mg/M2 and Keytruda 200 mg IV every 3 weeks- status post 3 cycles with first dose April 10, 2018 under the care and direction of Dr. Earlie Server.  She had recent follow-up systemic imaging for disease restaging with CT C/A/P on 05/10/18 which showed a mixed response to treatment with decrease in the size of the primary lung lesion as well as mediastinal lymphadenopathy and decrease in the size of the left adrenal gland metastasis and left retroperitoneal metastasis but there was suspicious disease progression in the bone which was felt likely post radiation treatment related.  The plan at present is to continue on her current  systemic regimen.  ALLERGIES:  has No Known Allergies.  Meds: Current Outpatient Medications  Medication Sig Dispense Refill  . acetaminophen (TYLENOL) 500 MG tablet Take 1,000 mg by mouth every 6 (six) hours as needed for mild pain.     Marland Kitchen alendronate (FOSAMAX) 70 MG tablet Take 70 mg by mouth once a week. Wednesday  3  . butalbital-aspirin-caffeine (FIORINAL) 50-325-40 MG capsule Take 1 capsule by mouth every 6 (six) hours as needed for migraine.   1  . Calcium Carbonate-Vitamin D (CALCIUM-D PO) Take 1 tablet by mouth 2 (two) times daily.    . fluconazole (DIFLUCAN) 200 MG tablet Take 1 tablet (200 mg total) by mouth daily. 5 tablet 0  . folic acid (FOLVITE) 1 MG tablet Take 1 tablet (1 mg total) by mouth daily. 30 tablet 4  . ibuprofen (ADVIL,MOTRIN) 200 MG tablet Take 600 mg by mouth every 6 (six) hours as needed for mild pain.     Marland Kitchen linaclotide (LINZESS) 72 MCG capsule Take 1 capsule (72 mcg total) by mouth daily before breakfast. 30 capsule 2  . lubiprostone (AMITIZA) 24 MCG capsule Take 1 capsule (24 mcg total) by mouth 2 (two) times daily with a meal. 60 capsule 2  . LUTEIN PO Take 1 tablet by mouth daily.    . magic mouthwash SOLN Take 5 mLs by mouth 4 (four) times daily as needed for mouth pain. 240 mL 2  . morphine (MS CONTIN) 30 MG 12 hr  tablet Take 1 tablet (30 mg total) by mouth every 12 (twelve) hours. 60 tablet 0  . ondansetron (ZOFRAN) 8 MG tablet Take 1 tablet (8 mg total) by mouth every 8 (eight) hours as needed for nausea or vomiting. 20 tablet 0  . oxyCODONE-acetaminophen (PERCOCET/ROXICET) 5-325 MG tablet Take 1 tablet by mouth every 6 (six) hours as needed for severe pain. 60 tablet 0  . prochlorperazine (COMPAZINE) 10 MG tablet Take 1 tablet (10 mg total) by mouth every 6 (six) hours as needed for nausea or vomiting. 30 tablet 0  . sucralfate (CARAFATE) 1 GM/10ML suspension Take 10 mLs (1 g total) by mouth 4 (four) times daily -  with meals and at bedtime. 420 mL 0  .  traMADol (ULTRAM) 50 MG tablet Take 50 mg by mouth 4 (four) times daily as needed (pain).   0  . zolpidem (AMBIEN) 10 MG tablet Take 1 tablet (10 mg total) by mouth at bedtime as needed for sleep. 30 tablet 0   No current facility-administered medications for this encounter.     Physical Findings:  weight is 121 lb 9.6 oz (55.2 kg). Her oral temperature is 98.9 F (37.2 C). Her blood pressure is 104/54 (abnormal) and her pulse is 80. Her oxygen saturation is 100%.  Pain Assessment Pain Score: 3  Pain Loc: Back/10 In general this is a well appearing Caucasian female in no acute distress.  She's alert and oriented x4 and appropriate throughout the examination. Cardiopulmonary assessment is negative for acute distress and she exhibits normal effort.   Lab Findings: Lab Results  Component Value Date   WBC 4.6 05/22/2018   HGB 10.2 (L) 05/22/2018   HCT 30.9 (L) 05/22/2018   MCV 86.3 05/22/2018   PLT 419 (H) 05/22/2018     Radiographic Findings: Ct Chest W Contrast  Result Date: 05/10/2018 CLINICAL DATA:  Lung cancer.  Restaging EXAM: CT CHEST, ABDOMEN, AND PELVIS WITH CONTRAST TECHNIQUE: Multidetector CT imaging of the chest, abdomen and pelvis was performed following the standard protocol during bolus administration of intravenous contrast. CONTRAST:  175mL OMNIPAQUE IOHEXOL 300 MG/ML  SOLN COMPARISON:  03/05/2018 FINDINGS: CT CHEST FINDINGS Cardiovascular: The heart size appears within normal limits. No pericardial effusion. Aortic atherosclerosis. Mediastinum/Nodes: Normal appearance of the thyroid gland. The trachea appears patent and is midline. Right paratracheal node measures 9 mm, image 22/2. Previously 1.3 cm. No hilar adenopathy. No enlarged supraclavicular or axillary lymph nodes. Lungs/Pleura: There is no pleural effusion identified. The right upper lobe lung mass has decreased in size in the interval. Currently this measures 4.8 x 3.4 cm, image 41/4. Previously 6.3 x 6.3 cm, image  41/4. No new pulmonary nodules or masses. Musculoskeletal: Permeative lesion involving the left 6 rib is identified is new compared with the previous exam, image 20/6 measuring 1.7 cm. There is a new fracture involving the lateral aspect of the right fourth rib. CT ABDOMEN PELVIS FINDINGS Hepatobiliary: No focal liver abnormality. The gallbladder appears normal. No biliary dilatation. Pancreas: Unremarkable. No pancreatic ductal dilatation or surrounding inflammatory changes. Spleen: Tiny nonspecific low-density structure within the spleen measures 5 mm. Spleen is otherwise normal in appearance and size. Adrenals/Urinary Tract: Normal appearance of the right adrenal gland. Left adrenal gland metastasis measures 2.1 by 1.1 cm, image 54/2. Previously 2.3 x 1.5 cm. The kidneys are unremarkable. No mass or hydronephrosis. Urinary bladder appears normal for degree of distention. Stomach/Bowel: The stomach is normal. The small bowel loops have a normal course and caliber without  obstruction. No pathologic dilatation of the colon. Vascular/Lymphatic: Normal appearance of the abdominal aorta. No aneurysm. No adenopathy within the abdomen or pelvis. No inguinal adenopathy. Reproductive: Uterus and bilateral adnexa are unremarkable. Other: The index lesion posterior to the left psoas muscle is again noted. On today's exam this measures 2.2 x 0.9 cm. Previously 2.4 x 1.7 cm. Musculoskeletal: Previous hypermetabolic lesion involving the right sacral wing measures 2.7 x 3.0 cm, image 88/2. Previously 1.3 x 2.0 cm. Enlargement of the right posterior left iliac bone lesion which measures 2.9 by 1.9 cm with new cortical erosion. This was not readily apparent on previous CT images from 03/05/18 PET-CT. New lytic lesion involving the right iliac bone measuring 1.3 cm. Increase in size of lytic lesion involving the right ischial rami which measures 2.2 cm versus 1.3 cm previously, image 116/2. There has been interval fracture of  previous hypermetabolic L2 metastasis. IMPRESSION: 1. Interval mixed response to therapy. There has been decrease in size of the primary right upper lobe lung lesion. Mediastinal adenopathy has also improved. Mild decrease in size of left adrenal gland metastasis and left retroperitoneal metastasis. 2. Progression of lytic bone metastases. Electronically Signed   By: Kerby Moors M.D.   On: 05/10/2018 10:51   Ct Abdomen Pelvis W Contrast  Result Date: 05/10/2018 CLINICAL DATA:  Lung cancer.  Restaging EXAM: CT CHEST, ABDOMEN, AND PELVIS WITH CONTRAST TECHNIQUE: Multidetector CT imaging of the chest, abdomen and pelvis was performed following the standard protocol during bolus administration of intravenous contrast. CONTRAST:  167mL OMNIPAQUE IOHEXOL 300 MG/ML  SOLN COMPARISON:  03/05/2018 FINDINGS: CT CHEST FINDINGS Cardiovascular: The heart size appears within normal limits. No pericardial effusion. Aortic atherosclerosis. Mediastinum/Nodes: Normal appearance of the thyroid gland. The trachea appears patent and is midline. Right paratracheal node measures 9 mm, image 22/2. Previously 1.3 cm. No hilar adenopathy. No enlarged supraclavicular or axillary lymph nodes. Lungs/Pleura: There is no pleural effusion identified. The right upper lobe lung mass has decreased in size in the interval. Currently this measures 4.8 x 3.4 cm, image 41/4. Previously 6.3 x 6.3 cm, image 41/4. No new pulmonary nodules or masses. Musculoskeletal: Permeative lesion involving the left 6 rib is identified is new compared with the previous exam, image 20/6 measuring 1.7 cm. There is a new fracture involving the lateral aspect of the right fourth rib. CT ABDOMEN PELVIS FINDINGS Hepatobiliary: No focal liver abnormality. The gallbladder appears normal. No biliary dilatation. Pancreas: Unremarkable. No pancreatic ductal dilatation or surrounding inflammatory changes. Spleen: Tiny nonspecific low-density structure within the spleen  measures 5 mm. Spleen is otherwise normal in appearance and size. Adrenals/Urinary Tract: Normal appearance of the right adrenal gland. Left adrenal gland metastasis measures 2.1 by 1.1 cm, image 54/2. Previously 2.3 x 1.5 cm. The kidneys are unremarkable. No mass or hydronephrosis. Urinary bladder appears normal for degree of distention. Stomach/Bowel: The stomach is normal. The small bowel loops have a normal course and caliber without obstruction. No pathologic dilatation of the colon. Vascular/Lymphatic: Normal appearance of the abdominal aorta. No aneurysm. No adenopathy within the abdomen or pelvis. No inguinal adenopathy. Reproductive: Uterus and bilateral adnexa are unremarkable. Other: The index lesion posterior to the left psoas muscle is again noted. On today's exam this measures 2.2 x 0.9 cm. Previously 2.4 x 1.7 cm. Musculoskeletal: Previous hypermetabolic lesion involving the right sacral wing measures 2.7 x 3.0 cm, image 88/2. Previously 1.3 x 2.0 cm. Enlargement of the right posterior left iliac bone lesion which measures 2.9  by 1.9 cm with new cortical erosion. This was not readily apparent on previous CT images from 03/05/18 PET-CT. New lytic lesion involving the right iliac bone measuring 1.3 cm. Increase in size of lytic lesion involving the right ischial rami which measures 2.2 cm versus 1.3 cm previously, image 116/2. There has been interval fracture of previous hypermetabolic L2 metastasis. IMPRESSION: 1. Interval mixed response to therapy. There has been decrease in size of the primary right upper lobe lung lesion. Mediastinal adenopathy has also improved. Mild decrease in size of left adrenal gland metastasis and left retroperitoneal metastasis. 2. Progression of lytic bone metastases. Electronically Signed   By: Kerby Moors M.D.   On: 05/10/2018 10:51    Impression/Plan: 1.  62 y.o. 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 She appears to have recovered well from the effects of her recent radiotherapy.  She is currently without complaints.  We discussed that while we are happy to continue to participate in her care if clinically indicated, at this point, we will plan to see her back on an as-needed basis.  She will continue in routine follow-up for continued disease management under the care and direction of Dr. Julien Nordmann.  She knows to call at anytime with any questions or concerns related to her previous radiotherapy.    Nicholos Johns, PA-C

## 2018-05-28 ENCOUNTER — Other Ambulatory Visit: Payer: Self-pay | Admitting: *Deleted

## 2018-05-28 DIAGNOSIS — R112 Nausea with vomiting, unspecified: Secondary | ICD-10-CM

## 2018-05-28 MED ORDER — ONDANSETRON HCL 8 MG PO TABS: 8.0000 mg | ORAL_TABLET | Freq: Three times a day (TID) | ORAL | 0 refills | Status: DC | PRN

## 2018-05-28 MED FILL — ONDANSETRON HCL 8 MG TABLET: 8 | 7 days supply | Qty: 20 | Fill #0

## 2018-05-28 MED FILL — FOLIC ACID 1 MG TABS: 1 | 30 days supply | Qty: 30 | Fill #2

## 2018-05-30 ENCOUNTER — Inpatient Hospital Stay: Payer: 59

## 2018-05-30 DIAGNOSIS — Z5112 Encounter for antineoplastic immunotherapy: Secondary | ICD-10-CM | POA: Diagnosis not present

## 2018-05-30 DIAGNOSIS — C7951 Secondary malignant neoplasm of bone: Secondary | ICD-10-CM

## 2018-05-30 DIAGNOSIS — C7801 Secondary malignant neoplasm of right lung: Secondary | ICD-10-CM | POA: Diagnosis not present

## 2018-05-30 DIAGNOSIS — I700 Atherosclerosis of aorta: Secondary | ICD-10-CM | POA: Diagnosis not present

## 2018-05-30 DIAGNOSIS — Z5111 Encounter for antineoplastic chemotherapy: Secondary | ICD-10-CM | POA: Diagnosis not present

## 2018-05-30 DIAGNOSIS — Z7982 Long term (current) use of aspirin: Secondary | ICD-10-CM | POA: Diagnosis not present

## 2018-05-30 DIAGNOSIS — C7972 Secondary malignant neoplasm of left adrenal gland: Secondary | ICD-10-CM | POA: Diagnosis not present

## 2018-05-30 DIAGNOSIS — C786 Secondary malignant neoplasm of retroperitoneum and peritoneum: Secondary | ICD-10-CM | POA: Diagnosis not present

## 2018-05-30 LAB — CMP (CANCER CENTER ONLY)
ALT: 73 U/L — ABNORMAL HIGH (ref 0–44)
AST: 47 U/L — AB (ref 15–41)
Albumin: 3.5 g/dL (ref 3.5–5.0)
Alkaline Phosphatase: 81 U/L (ref 38–126)
Anion gap: 9 (ref 5–15)
BUN: 17 mg/dL (ref 6–20)
CHLORIDE: 102 mmol/L (ref 98–111)
CO2: 26 mmol/L (ref 22–32)
Calcium: 9.6 mg/dL (ref 8.9–10.3)
Creatinine: 0.69 mg/dL (ref 0.44–1.00)
GFR, Est AFR Am: >60 mL/min (ref >60)
GFR, Estimated: >60 mL/min (ref >60)
GLUCOSE: 87 mg/dL (ref 70–99)
Potassium: 3.8 mmol/L (ref 3.5–5.1)
SODIUM: 137 mmol/L (ref 135–145)
Total Bilirubin: <0.2 mg/dL — ABNORMAL LOW (ref 0.3–1.2)
Total Protein: 7 g/dL (ref 6.5–8.1)

## 2018-05-30 LAB — CBC WITH DIFFERENTIAL (CANCER CENTER ONLY)
Basophils Absolute: 0 10*3/uL (ref 0.0–0.1)
Basophils Relative: 0 %
EOS ABS: 0.1 10*3/uL (ref 0.0–0.5)
EOS PCT: 2 %
HCT: 29.6 % — ABNORMAL LOW (ref 34.8–46.6)
Hemoglobin: 9.6 g/dL — ABNORMAL LOW (ref 11.6–15.9)
LYMPHS ABS: 0.6 10*3/uL — AB (ref 0.9–3.3)
Lymphocytes Relative: 20 %
MCH: 28.4 pg (ref 25.1–34.0)
MCHC: 32.4 g/dL (ref 31.5–36.0)
MCV: 87.6 fL (ref 79.5–101.0)
MONO ABS: 0.5 10*3/uL (ref 0.1–0.9)
MONOS PCT: 16 %
Neutro Abs: 1.7 10*3/uL (ref 1.5–6.5)
Neutrophils Relative %: 62 %
PLATELETS: 295 10*3/uL (ref 145–400)
RBC: 3.38 MIL/uL — ABNORMAL LOW (ref 3.70–5.45)
RDW: 19.7 % — ABNORMAL HIGH (ref 11.2–14.5)
WBC Count: 2.8 10*3/uL — ABNORMAL LOW (ref 3.9–10.3)

## 2018-05-30 LAB — RESEARCH LABS

## 2018-06-01 ENCOUNTER — Telehealth: Payer: Self-pay

## 2018-06-01 ENCOUNTER — Other Ambulatory Visit: Payer: Self-pay | Admitting: Oncology

## 2018-06-01 MED ORDER — TRAMADOL HCL 50 MG PO TABS: 50.0000 mg | ORAL_TABLET | Freq: Four times a day (QID) | ORAL | 0 refills | Status: DC | PRN

## 2018-06-01 MED FILL — traMADol HCL 50 MG TABS: 50 | 5 days supply | Qty: 30 | Fill #0

## 2018-06-01 NOTE — Telephone Encounter (Signed)
Spoke with patient regarding pain management.  She does want to take Ultram only has a couple left, recommended stay away from NSAIDS and she can try Prilosec OTC (consulted with Mikey Bussing NP)  Patient verbalized an understanding and desires Ultram be sent into [facility] OP pharmacy.

## 2018-06-01 NOTE — Telephone Encounter (Signed)
Patient calls requesting a non-opiod alternative to take for pain.  Has stopped taking the Morphine a while back.   Also complaining of heartburn wants to have a recommendation for this.  [phone]

## 2018-06-06 ENCOUNTER — Inpatient Hospital Stay: Payer: 59

## 2018-06-06 DIAGNOSIS — Z5111 Encounter for antineoplastic chemotherapy: Secondary | ICD-10-CM | POA: Diagnosis not present

## 2018-06-06 DIAGNOSIS — C7801 Secondary malignant neoplasm of right lung: Secondary | ICD-10-CM | POA: Diagnosis not present

## 2018-06-06 DIAGNOSIS — Z7982 Long term (current) use of aspirin: Secondary | ICD-10-CM | POA: Diagnosis not present

## 2018-06-06 DIAGNOSIS — C786 Secondary malignant neoplasm of retroperitoneum and peritoneum: Secondary | ICD-10-CM | POA: Diagnosis not present

## 2018-06-06 DIAGNOSIS — C7951 Secondary malignant neoplasm of bone: Secondary | ICD-10-CM | POA: Diagnosis not present

## 2018-06-06 DIAGNOSIS — Z5112 Encounter for antineoplastic immunotherapy: Secondary | ICD-10-CM | POA: Diagnosis not present

## 2018-06-06 DIAGNOSIS — C7972 Secondary malignant neoplasm of left adrenal gland: Secondary | ICD-10-CM | POA: Diagnosis not present

## 2018-06-06 DIAGNOSIS — I700 Atherosclerosis of aorta: Secondary | ICD-10-CM | POA: Diagnosis not present

## 2018-06-06 LAB — CBC WITH DIFFERENTIAL (CANCER CENTER ONLY)
Basophils Absolute: 0 10*3/uL (ref 0.0–0.1)
Basophils Relative: 1 %
Eosinophils Absolute: 0.1 10*3/uL (ref 0.0–0.5)
Eosinophils Relative: 2 %
HCT: 29.9 % — ABNORMAL LOW (ref 34.8–46.6)
Hemoglobin: 10 g/dL — ABNORMAL LOW (ref 11.6–15.9)
Lymphocytes Relative: 15 %
Lymphs Abs: 0.5 10*3/uL — ABNORMAL LOW (ref 0.9–3.3)
MCH: 29.5 pg (ref 25.1–34.0)
MCHC: 33.4 g/dL (ref 31.5–36.0)
MCV: 88.5 fL (ref 79.5–101.0)
Monocytes Absolute: 0.6 10*3/uL (ref 0.1–0.9)
Monocytes Relative: 17 %
Neutro Abs: 2 10*3/uL (ref 1.5–6.5)
Neutrophils Relative %: 65 %
Platelet Count: 334 10*3/uL (ref 145–400)
RBC: 3.38 MIL/uL — ABNORMAL LOW (ref 3.70–5.45)
RDW: 24.2 % — ABNORMAL HIGH (ref 11.2–14.5)
WBC Count: 3.2 10*3/uL — ABNORMAL LOW (ref 3.9–10.3)

## 2018-06-06 LAB — CMP (CANCER CENTER ONLY)
ALT: 58 U/L — ABNORMAL HIGH (ref 0–44)
ANION GAP: 8 (ref 5–15)
AST: 42 U/L — ABNORMAL HIGH (ref 15–41)
Albumin: 3.6 g/dL (ref 3.5–5.0)
Alkaline Phosphatase: 67 U/L (ref 38–126)
BUN: 15 mg/dL (ref 6–20)
CALCIUM: 9.1 mg/dL (ref 8.9–10.3)
CO2: 26 mmol/L (ref 22–32)
Chloride: 104 mmol/L (ref 98–111)
Creatinine: 0.63 mg/dL (ref 0.44–1.00)
GFR, Estimated: >60 mL/min (ref >60)
Glucose, Bld: 102 mg/dL — ABNORMAL HIGH (ref 70–99)
POTASSIUM: 4.5 mmol/L (ref 3.5–5.1)
SODIUM: 138 mmol/L (ref 135–145)
TOTAL PROTEIN: 7 g/dL (ref 6.5–8.1)
Total Bilirubin: <0.2 mg/dL — ABNORMAL LOW (ref 0.3–1.2)

## 2018-06-07 DIAGNOSIS — Z23 Encounter for immunization: Secondary | ICD-10-CM | POA: Diagnosis not present

## 2018-06-07 DIAGNOSIS — C3400 Malignant neoplasm of unspecified main bronchus: Secondary | ICD-10-CM | POA: Diagnosis not present

## 2018-06-13 ENCOUNTER — Inpatient Hospital Stay: Payer: 59

## 2018-06-13 ENCOUNTER — Inpatient Hospital Stay (HOSPITAL_BASED_OUTPATIENT_CLINIC_OR_DEPARTMENT_OTHER): Payer: 59 | Admitting: Internal Medicine

## 2018-06-13 ENCOUNTER — Inpatient Hospital Stay: Payer: 59 | Attending: Internal Medicine

## 2018-06-13 ENCOUNTER — Encounter: Payer: Self-pay | Admitting: Internal Medicine

## 2018-06-13 VITALS — BP 119/71 | HR 105 | Temp 99.0°F | Resp 16 | Ht 67.0 in | Wt 117.3 lb

## 2018-06-13 VITALS — HR 98

## 2018-06-13 DIAGNOSIS — R52 Pain, unspecified: Secondary | ICD-10-CM | POA: Insufficient documentation

## 2018-06-13 DIAGNOSIS — C7951 Secondary malignant neoplasm of bone: Secondary | ICD-10-CM | POA: Diagnosis not present

## 2018-06-13 DIAGNOSIS — Z79899 Other long term (current) drug therapy: Secondary | ICD-10-CM | POA: Diagnosis not present

## 2018-06-13 DIAGNOSIS — R5383 Other fatigue: Secondary | ICD-10-CM | POA: Diagnosis not present

## 2018-06-13 DIAGNOSIS — C7989 Secondary malignant neoplasm of other specified sites: Secondary | ICD-10-CM | POA: Insufficient documentation

## 2018-06-13 DIAGNOSIS — C7802 Secondary malignant neoplasm of left lung: Secondary | ICD-10-CM | POA: Diagnosis not present

## 2018-06-13 DIAGNOSIS — K219 Gastro-esophageal reflux disease without esophagitis: Secondary | ICD-10-CM

## 2018-06-13 DIAGNOSIS — C3490 Malignant neoplasm of unspecified part of unspecified bronchus or lung: Secondary | ICD-10-CM

## 2018-06-13 DIAGNOSIS — C7801 Secondary malignant neoplasm of right lung: Secondary | ICD-10-CM

## 2018-06-13 DIAGNOSIS — Z5112 Encounter for antineoplastic immunotherapy: Secondary | ICD-10-CM | POA: Diagnosis not present

## 2018-06-13 DIAGNOSIS — C801 Malignant (primary) neoplasm, unspecified: Secondary | ICD-10-CM | POA: Insufficient documentation

## 2018-06-13 DIAGNOSIS — Z5111 Encounter for antineoplastic chemotherapy: Secondary | ICD-10-CM | POA: Insufficient documentation

## 2018-06-13 DIAGNOSIS — C799 Secondary malignant neoplasm of unspecified site: Secondary | ICD-10-CM

## 2018-06-13 LAB — CBC WITH DIFFERENTIAL (CANCER CENTER ONLY)
BASOS ABS: 0 10*3/uL (ref 0.0–0.1)
Basophils Relative: 1 %
Eosinophils Absolute: 0.1 10*3/uL (ref 0.0–0.5)
Eosinophils Relative: 3 %
HCT: 34.7 % — ABNORMAL LOW (ref 34.8–46.6)
Hemoglobin: 11.1 g/dL — ABNORMAL LOW (ref 11.6–15.9)
Lymphocytes Relative: 17 %
Lymphs Abs: 0.6 10*3/uL — ABNORMAL LOW (ref 0.9–3.3)
MCH: 29.5 pg (ref 25.1–34.0)
MCHC: 32 g/dL (ref 31.5–36.0)
MCV: 92.3 fL (ref 79.5–101.0)
MONOS PCT: 16 %
Monocytes Absolute: 0.6 10*3/uL (ref 0.1–0.9)
Neutro Abs: 2.4 10*3/uL (ref 1.5–6.5)
Neutrophils Relative %: 63 %
Platelet Count: 240 10*3/uL (ref 145–400)
RBC: 3.76 MIL/uL (ref 3.70–5.45)
RDW: 21 % — ABNORMAL HIGH (ref 11.2–14.5)
WBC: 3.7 10*3/uL — AB (ref 3.9–10.3)

## 2018-06-13 LAB — CMP (CANCER CENTER ONLY)
ALT: 47 U/L — ABNORMAL HIGH (ref 0–44)
ANION GAP: 7 (ref 5–15)
AST: 41 U/L (ref 15–41)
Albumin: 3.5 g/dL (ref 3.5–5.0)
Alkaline Phosphatase: 66 U/L (ref 38–126)
BUN: 13 mg/dL (ref 6–20)
CO2: 27 mmol/L (ref 22–32)
Calcium: 9.4 mg/dL (ref 8.9–10.3)
Chloride: 107 mmol/L (ref 98–111)
Creatinine: 0.7 mg/dL (ref 0.44–1.00)
GFR, Est AFR Am: >60 mL/min (ref >60)
Glucose, Bld: 99 mg/dL (ref 70–99)
Potassium: 4.1 mmol/L (ref 3.5–5.1)
SODIUM: 141 mmol/L (ref 135–145)
TOTAL PROTEIN: 7.1 g/dL (ref 6.5–8.1)
Total Bilirubin: <0.2 mg/dL — ABNORMAL LOW (ref 0.3–1.2)

## 2018-06-13 LAB — TSH: TSH: 0.908 u[IU]/mL (ref 0.308–3.960)

## 2018-06-13 MED ORDER — PALONOSETRON HCL INJECTION 0.25 MG/5ML
0.2500 mg | Freq: Once | INTRAVENOUS | Status: AC
  Administered 2018-06-13: 0.25 mg via INTRAVENOUS

## 2018-06-13 MED ORDER — TRAMADOL HCL 50 MG PO TABS: 50.0000 mg | ORAL_TABLET | Freq: Two times a day (BID) | ORAL | 0 refills | Status: DC | PRN

## 2018-06-13 MED ORDER — SODIUM CHLORIDE 0.9 % IV SOLN
200.0000 mg | Freq: Once | INTRAVENOUS | Status: AC
  Administered 2018-06-13: 200 mg via INTRAVENOUS
  Filled 2018-06-13: qty 8

## 2018-06-13 MED ORDER — LIDOCAINE-PRILOCAINE 2.5-2.5 % EX CREA: 1.0000 "application " | TOPICAL_CREAM | CUTANEOUS | 0 refills | Status: DC | PRN

## 2018-06-13 MED ORDER — SODIUM CHLORIDE 0.9 % IV SOLN
Freq: Once | INTRAVENOUS | Status: AC
  Administered 2018-06-13: 15:00:00 via INTRAVENOUS
  Filled 2018-06-13: qty 5

## 2018-06-13 MED ORDER — CYANOCOBALAMIN 1000 MCG/ML IJ SOLN
INTRAMUSCULAR | Status: AC
  Filled 2018-06-13: qty 1

## 2018-06-13 MED ORDER — OMEPRAZOLE 20 MG PO CPDR: 20.0000 mg | DELAYED_RELEASE_CAPSULE | Freq: Every day | ORAL | 2 refills | Status: DC

## 2018-06-13 MED ORDER — SODIUM CHLORIDE 0.9 % IV SOLN
460.0000 mg | Freq: Once | INTRAVENOUS | Status: AC
  Administered 2018-06-13: 460 mg via INTRAVENOUS
  Filled 2018-06-13: qty 46

## 2018-06-13 MED ORDER — CYANOCOBALAMIN 1000 MCG/ML IJ SOLN
1000.0000 ug | Freq: Once | INTRAMUSCULAR | Status: AC
  Administered 2018-06-13: 1000 ug via INTRAMUSCULAR

## 2018-06-13 MED ORDER — SODIUM CHLORIDE 0.9 % IV SOLN
Freq: Once | INTRAVENOUS | Status: AC
  Administered 2018-06-13: 14:00:00 via INTRAVENOUS
  Filled 2018-06-13: qty 250

## 2018-06-13 MED ORDER — SODIUM CHLORIDE 0.9 % IV SOLN
800.0000 mg | Freq: Once | INTRAVENOUS | Status: AC
  Administered 2018-06-13: 800 mg via INTRAVENOUS
  Filled 2018-06-13: qty 20

## 2018-06-13 MED ORDER — PALONOSETRON HCL INJECTION 0.25 MG/5ML
INTRAVENOUS | Status: AC
  Filled 2018-06-13: qty 5

## 2018-06-13 MED FILL — OMEPRAZOLE 20 MG CPDR: 20 | 30 days supply | Qty: 30 | Fill #0

## 2018-06-13 MED FILL — traMADol HCL 50 MG TABS: 50 | 30 days supply | Qty: 60 | Fill #0

## 2018-06-13 MED FILL — LIDOCAINE-PRILOCAINE CREAM: 2.5-2.5 | 10 days supply | Qty: 30 | Fill #0

## 2018-06-13 NOTE — Patient Instructions (Signed)
[facility] Discharge Instructions for Patients Receiving Chemotherapy  Today you received the following chemotherapy agents: Pembrolizumab (Keytruda), Pemetrexed (Alimta), and Carboplatin (Paraplatin)  To help prevent nausea and vomiting after your treatment, we encourage you to take your nausea medication as directed. Received Aloxi during treatment today-->Take Compazine (not Zofran) for the next 3 days as needed.    If you develop nausea and vomiting that is not controlled by your nausea medication, call the clinic.   BELOW ARE SYMPTOMS THAT SHOULD BE REPORTED IMMEDIATELY:  *FEVER GREATER THAN 100.5 F  *CHILLS WITH OR WITHOUT FEVER  NAUSEA AND VOMITING THAT IS NOT CONTROLLED WITH YOUR NAUSEA MEDICATION  *UNUSUAL SHORTNESS OF BREATH  *UNUSUAL BRUISING OR BLEEDING  TENDERNESS IN MOUTH AND THROAT WITH OR WITHOUT PRESENCE OF ULCERS  *URINARY PROBLEMS  *BOWEL PROBLEMS  UNUSUAL RASH Items with * indicate a potential emergency and should be followed up as soon as possible.  Feel free to call the clinic should you have any questions or concerns. The clinic phone number is [phone].  Please show the [facility] at check-in to the Emergency Department and triage nurse.

## 2018-06-13 NOTE — Progress Notes (Signed)
[place] [phone]   [phone]  OFFICE PROGRESS NOTE  Donald Prose, MD [place] [address]  DIAGNOSIS: Metastatic poorly differentiated neoplasm questionable for early differentiated non-small cell carcinoma presented with large right upper lobe lung mass in addition to right neck and bone metastasis diagnosed and June 2019.  Metastatic sarcoma cannot be also excluded at this point. Molecular studies by foundation 1 showed no actionable mutations. PDL 1 expression 99%  PRIOR THERAPY:Palliative radiotherapy to the neck mass and hip lesion under the care of Dr. Tammi Klippel   CURRENT THERAPY: Systemic chemotherapy with carboplatin for AUC of 5, Alimta 500 mg/M2 and Keytruda 200 mg IV every 3 weeks.  First dose expected April 10, 2018.  Status post 3 cycles.  INTERVAL HISTORY: Rebecca Lucas 62 y.o. female returns to the clinic today for follow-up visit.  The patient is feeling fine today with no concerning complaints except for mild fatigue.  She is tolerating her current treatment with chemotherapy and Ketruda (pembrolizumab) fairly well.  She denied having any significant nausea or vomiting.  She has no fever or chills.  She denied having any significant chest pain, shortness breath, cough or hemoptysis.  She discontinued her pain medication with morphine sulfate and oxycodone.  She currently takes tramadol twice daily as needed for pain.  She is requesting refill of Prilosec for acid reflux.  She has no significant weight loss or night sweats.  The patient is here today for evaluation before starting cycle #4 of her treatment.  MEDICAL HISTORY:No past medical history on file.  ALLERGIES:  has No Known Allergies.  MEDICATIONS:  Current Outpatient Medications  Medication Sig Dispense Refill  . acetaminophen (TYLENOL) 500 MG tablet Take 1,000 mg by mouth every 6 (six) hours as needed for mild pain.     Marland Kitchen alendronate  (FOSAMAX) 70 MG tablet Take 70 mg by mouth once a week. Wednesday  3  . butalbital-aspirin-caffeine (FIORINAL) 50-325-40 MG capsule Take 1 capsule by mouth every 6 (six) hours as needed for migraine.   1  . Calcium Carbonate-Vitamin D (CALCIUM-D PO) Take 1 tablet by mouth 2 (two) times daily.    . fluconazole (DIFLUCAN) 200 MG tablet Take 1 tablet (200 mg total) by mouth daily. 5 tablet 0  . folic acid (FOLVITE) 1 MG tablet Take 1 tablet (1 mg total) by mouth daily. 30 tablet 4  . ibuprofen (ADVIL,MOTRIN) 200 MG tablet Take 600 mg by mouth every 6 (six) hours as needed for mild pain.     Marland Kitchen linaclotide (LINZESS) 72 MCG capsule Take 1 capsule (72 mcg total) by mouth daily before breakfast. 30 capsule 2  . lubiprostone (AMITIZA) 24 MCG capsule Take 1 capsule (24 mcg total) by mouth 2 (two) times daily with a meal. 60 capsule 2  . LUTEIN PO Take 1 tablet by mouth daily.    . magic mouthwash SOLN Take 5 mLs by mouth 4 (four) times daily as needed for mouth pain. 240 mL 2  . morphine (MS CONTIN) 30 MG 12 hr tablet Take 1 tablet (30 mg total) by mouth every 12 (twelve) hours. 60 tablet 0  . ondansetron (ZOFRAN) 8 MG tablet Take 1 tablet (8 mg total) by mouth every 8 (eight) hours as needed for nausea or vomiting. 20 tablet 0  . oxyCODONE-acetaminophen (PERCOCET/ROXICET) 5-325 MG tablet Take 1 tablet by mouth every 6 (six) hours as needed for severe pain. 60 tablet  0  . prochlorperazine (COMPAZINE) 10 MG tablet Take 1 tablet (10 mg total) by mouth every 6 (six) hours as needed for nausea or vomiting. 30 tablet 0  . sucralfate (CARAFATE) 1 GM/10ML suspension Take 10 mLs (1 g total) by mouth 4 (four) times daily -  with meals and at bedtime. 420 mL 0  . traMADol (ULTRAM) 50 MG tablet Take 1 tablet (50 mg total) by mouth 4 (four) times daily as needed for moderate pain. 30 tablet 0  . zolpidem (AMBIEN) 10 MG tablet Take 1 tablet (10 mg total) by mouth at bedtime as needed for sleep. 30 tablet 0   No current  facility-administered medications for this visit.     SURGICAL HISTORY: No past surgical history on file.  REVIEW OF SYSTEMS:  A comprehensive review of systems was negative except for: Constitutional: positive for fatigue Musculoskeletal: positive for back pain   PHYSICAL EXAMINATION: General appearance: alert, cooperative, fatigued and no distress Head: Normocephalic, without obvious abnormality, atraumatic Neck: no adenopathy, no JVD, supple, symmetrical, trachea midline and thyroid not enlarged, symmetric, no tenderness/mass/nodules Lymph nodes: Cervical, supraclavicular, and axillary nodes normal. Resp: clear to auscultation bilaterally Back: symmetric, no curvature. ROM normal. No CVA tenderness. Cardio: regular rate and rhythm, S1, S2 normal, no murmur, click, rub or gallop GI: soft, non-tender; bowel sounds normal; no masses,  no organomegaly Extremities: extremities normal, atraumatic, no cyanosis or edema  ECOG PERFORMANCE STATUS: 1 - Symptomatic but completely ambulatory  Blood pressure 119/71, pulse (!) 105, temperature 99 F (37.2 C), temperature source Oral, resp. rate 16, height 5\' 7"  (1.702 m), weight 117 lb 4.8 oz (53.2 kg), SpO2 100 %.  LABORATORY DATA: Lab Results  Component Value Date   WBC 3.7 (L) 06/13/2018   HGB 11.1 (L) 06/13/2018   HCT 34.7 (L) 06/13/2018   MCV 92.3 06/13/2018   PLT 240 06/13/2018      Chemistry      Component Value Date/Time   NA 138 06/06/2018 1458   K 4.5 06/06/2018 1458   CL 104 06/06/2018 1458   CO2 26 06/06/2018 1458   BUN 15 06/06/2018 1458   CREATININE 0.63 06/06/2018 1458      Component Value Date/Time   CALCIUM 9.1 06/06/2018 1458   ALKPHOS 67 06/06/2018 1458   AST 42 (H) 06/06/2018 1458   ALT 58 (H) 06/06/2018 1458   BILITOT <0.2 (L) 06/06/2018 1458       RADIOGRAPHIC STUDIES: No results found.  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 PDL 1 expression was 99%. The molecular studies by foundation 1 as well as [employer] showed no actionable mutations. The patient was a started on treatment with systemic chemotherapy with carboplatin for AUC of 5, Alimta 500 mg/M2 and Keytruda 200 mg IV every 3 weeks.  She is status post 3 cycles.  The patient continues to tolerate this treatment well with no concerning adverse effects. I recommended for her to proceed with cycle #4 today as scheduled. For pain management I gave the patient refill of tramadol and we discontinue the MS Contin and oxycodone. For the acid reflux, I gave her prescription for Prilosec. I will arrange for the  patient to have Port-A-Cath placed by interventional radiology and I sent prescription for EMLA cream to her pharmacy. She will come back for follow-up visit in 3 weeks for evaluation after repeating CT scan of the chest, abdomen and pelvis for restaging of her disease. She was advised to call immediately if she has any concerning symptoms in the interval. .The patient voices understanding of current disease status and treatment options and is in agreement with the current care plan.  All questions were answered. The patient knows to call the clinic with any problems, questions or concerns. We can certainly see the patient much sooner if necessary.  Disclaimer: This note was dictated with voice recognition software. Similar sounding words can inadvertently be transcribed and may not be corrected upon review.

## 2018-06-20 ENCOUNTER — Inpatient Hospital Stay: Payer: 59

## 2018-06-20 DIAGNOSIS — Z5112 Encounter for antineoplastic immunotherapy: Secondary | ICD-10-CM | POA: Diagnosis not present

## 2018-06-20 DIAGNOSIS — C7951 Secondary malignant neoplasm of bone: Secondary | ICD-10-CM | POA: Diagnosis not present

## 2018-06-20 DIAGNOSIS — R5383 Other fatigue: Secondary | ICD-10-CM | POA: Diagnosis not present

## 2018-06-20 DIAGNOSIS — Z5111 Encounter for antineoplastic chemotherapy: Secondary | ICD-10-CM | POA: Diagnosis not present

## 2018-06-20 DIAGNOSIS — K219 Gastro-esophageal reflux disease without esophagitis: Secondary | ICD-10-CM | POA: Diagnosis not present

## 2018-06-20 DIAGNOSIS — C801 Malignant (primary) neoplasm, unspecified: Secondary | ICD-10-CM | POA: Diagnosis not present

## 2018-06-20 DIAGNOSIS — C7801 Secondary malignant neoplasm of right lung: Secondary | ICD-10-CM | POA: Diagnosis not present

## 2018-06-20 DIAGNOSIS — R52 Pain, unspecified: Secondary | ICD-10-CM | POA: Diagnosis not present

## 2018-06-20 DIAGNOSIS — C7802 Secondary malignant neoplasm of left lung: Secondary | ICD-10-CM | POA: Diagnosis not present

## 2018-06-20 LAB — CBC WITH DIFFERENTIAL (CANCER CENTER ONLY)
BAND NEUTROPHILS: 1 %
Basophils Absolute: 0.1 10*3/uL (ref 0.0–0.1)
Basophils Relative: 3 %
Eosinophils Absolute: 0.1 10*3/uL (ref 0.0–0.5)
Eosinophils Relative: 4 %
HEMATOCRIT: 31.2 % — AB (ref 36.0–46.0)
HEMOGLOBIN: 10.2 g/dL — AB (ref 12.0–15.0)
LYMPHS ABS: 0.6 10*3/uL — AB (ref 0.7–4.0)
Lymphocytes Relative: 32 %
MCH: 29.7 pg (ref 26.0–34.0)
MCHC: 32.7 g/dL (ref 30.0–36.0)
MCV: 90.7 fL (ref 80.0–100.0)
MONOS PCT: 8 %
MYELOCYTES: 1 %
Monocytes Absolute: 0.2 10*3/uL (ref 0.1–1.0)
NEUTROS ABS: 1 10*3/uL — AB (ref 1.7–17.7)
Neutrophils Relative %: 51 %
Platelet Count: 184 10*3/uL (ref 150–400)
RBC: 3.44 MIL/uL — AB (ref 3.87–5.11)
RDW: 18.6 % — AB (ref 11.5–15.5)
WBC Count: 1.9 10*3/uL — ABNORMAL LOW (ref 4.0–10.5)
nRBC: 0 % (ref 0.0–0.2)

## 2018-06-20 LAB — CMP (CANCER CENTER ONLY)
ALBUMIN: 3.7 g/dL (ref 3.5–5.0)
ALT: 56 U/L — ABNORMAL HIGH (ref 0–44)
AST: 42 U/L — AB (ref 15–41)
Alkaline Phosphatase: 66 U/L (ref 38–126)
Anion gap: 10 (ref 5–15)
BILIRUBIN TOTAL: 0.3 mg/dL (ref 0.3–1.2)
BUN: 20 mg/dL (ref 6–20)
CO2: 24 mmol/L (ref 22–32)
Calcium: 9.3 mg/dL (ref 8.9–10.3)
Chloride: 102 mmol/L (ref 98–111)
Creatinine: 0.69 mg/dL (ref 0.44–1.00)
GFR, Est AFR Am: >60 mL/min (ref >60)
GFR, Estimated: >60 mL/min (ref >60)
GLUCOSE: 90 mg/dL (ref 70–99)
POTASSIUM: 3.8 mmol/L (ref 3.5–5.1)
Sodium: 136 mmol/L (ref 135–145)
TOTAL PROTEIN: 7.2 g/dL (ref 6.5–8.1)

## 2018-06-25 ENCOUNTER — Other Ambulatory Visit: Payer: Self-pay | Admitting: Radiology

## 2018-06-25 MED FILL — ALENDRONATE NA 70 MG TAB: 70 | 84 days supply | Qty: 12 | Fill #2

## 2018-06-26 ENCOUNTER — Ambulatory Visit (HOSPITAL_COMMUNITY)
Admission: RE | Admit: 2018-06-26 | Discharge: 2018-06-26 | Disposition: A | Discharge status: Home / Self Care | Payer: 59 | Source: Ambulatory Visit | Attending: Internal Medicine | Admitting: Internal Medicine

## 2018-06-26 ENCOUNTER — Encounter (HOSPITAL_COMMUNITY): Payer: Self-pay

## 2018-06-26 ENCOUNTER — Other Ambulatory Visit: Payer: Self-pay

## 2018-06-26 ENCOUNTER — Other Ambulatory Visit: Payer: Self-pay | Admitting: Internal Medicine

## 2018-06-26 DIAGNOSIS — Z5111 Encounter for antineoplastic chemotherapy: Secondary | ICD-10-CM | POA: Diagnosis not present

## 2018-06-26 DIAGNOSIS — Z452 Encounter for adjustment and management of vascular access device: Secondary | ICD-10-CM | POA: Diagnosis not present

## 2018-06-26 DIAGNOSIS — C799 Secondary malignant neoplasm of unspecified site: Secondary | ICD-10-CM

## 2018-06-26 DIAGNOSIS — C7951 Secondary malignant neoplasm of bone: Secondary | ICD-10-CM | POA: Insufficient documentation

## 2018-06-26 DIAGNOSIS — C7989 Secondary malignant neoplasm of other specified sites: Secondary | ICD-10-CM | POA: Insufficient documentation

## 2018-06-26 DIAGNOSIS — C3490 Malignant neoplasm of unspecified part of unspecified bronchus or lung: Secondary | ICD-10-CM | POA: Insufficient documentation

## 2018-06-26 HISTORY — PX: IR IMAGING GUIDED PORT INSERTION: IMG5740

## 2018-06-26 LAB — CBC WITH DIFFERENTIAL/PLATELET
ABS IMMATURE GRANULOCYTES: 0.02 10*3/uL (ref 0.00–0.07)
BASOS ABS: 0 10*3/uL (ref 0.0–0.1)
Basophils Relative: 0 %
Eosinophils Absolute: 0.1 10*3/uL (ref 0.0–0.5)
Eosinophils Relative: 2 %
HCT: 32.4 % — ABNORMAL LOW (ref 36.0–46.0)
HEMOGLOBIN: 10.4 g/dL — AB (ref 12.0–15.0)
IMMATURE GRANULOCYTES: 1 %
LYMPHS PCT: 12 %
Lymphs Abs: 0.4 10*3/uL — ABNORMAL LOW (ref 0.7–4.0)
MCH: 30.9 pg (ref 26.0–34.0)
MCHC: 32.1 g/dL (ref 30.0–36.0)
MCV: 96.1 fL (ref 80.0–100.0)
MONO ABS: 0.4 10*3/uL (ref 0.1–1.0)
Monocytes Relative: 12 %
NEUTROS ABS: 2.5 10*3/uL (ref 1.7–7.7)
NRBC: 0 % (ref 0.0–0.2)
Neutrophils Relative %: 73 %
Platelets: 207 10*3/uL (ref 150–400)
RBC: 3.37 MIL/uL — AB (ref 3.87–5.11)
RDW: 18.7 % — ABNORMAL HIGH (ref 11.5–15.5)
WBC: 3.3 10*3/uL — AB (ref 4.0–10.5)

## 2018-06-26 LAB — PROTIME-INR
INR: 0.96
PROTHROMBIN TIME: 12.7 s (ref 11.4–15.2)

## 2018-06-26 MED ORDER — CEFAZOLIN SODIUM-DEXTROSE 2-4 GM/100ML-% IV SOLN
2.0000 g | INTRAVENOUS | Status: AC
  Administered 2018-06-26: 2 g via INTRAVENOUS

## 2018-06-26 MED ORDER — LIDOCAINE-EPINEPHRINE (PF) 2 %-1:200000 IJ SOLN
INTRAMUSCULAR | Status: AC
  Filled 2018-06-26: qty 20

## 2018-06-26 MED ORDER — LIDOCAINE HCL (PF) 1 % IJ SOLN
INTRAMUSCULAR | Status: AC | PRN
  Administered 2018-06-26: 10 mL

## 2018-06-26 MED ORDER — SODIUM CHLORIDE 0.9 % IV SOLN
INTRAVENOUS | Status: DC
  Administered 2018-06-26: 11:00:00 via INTRAVENOUS

## 2018-06-26 MED ORDER — LIDOCAINE HCL 1 % IJ SOLN
INTRAMUSCULAR | Status: AC
  Filled 2018-06-26: qty 20

## 2018-06-26 MED ORDER — FENTANYL CITRATE (PF) 100 MCG/2ML IJ SOLN
INTRAMUSCULAR | Status: AC | PRN
  Administered 2018-06-26 (×2): 50 ug via INTRAVENOUS

## 2018-06-26 MED ORDER — HEPARIN SOD (PORK) LOCK FLUSH 100 UNIT/ML IV SOLN
INTRAVENOUS | Status: AC | PRN
  Administered 2018-06-26: 500 [IU] via INTRAVENOUS

## 2018-06-26 MED ORDER — CEFAZOLIN SODIUM-DEXTROSE 2-4 GM/100ML-% IV SOLN
INTRAVENOUS | Status: AC
  Administered 2018-06-26: 2 g via INTRAVENOUS
  Filled 2018-06-26: qty 100

## 2018-06-26 MED ORDER — MIDAZOLAM HCL 2 MG/2ML IJ SOLN
INTRAMUSCULAR | Status: AC
  Filled 2018-06-26: qty 2

## 2018-06-26 MED ORDER — HEPARIN SOD (PORK) LOCK FLUSH 100 UNIT/ML IV SOLN
INTRAVENOUS | Status: AC
  Filled 2018-06-26: qty 5

## 2018-06-26 MED ORDER — FENTANYL CITRATE (PF) 100 MCG/2ML IJ SOLN
INTRAMUSCULAR | Status: AC
  Filled 2018-06-26: qty 2

## 2018-06-26 MED ORDER — MIDAZOLAM HCL 2 MG/2ML IJ SOLN
INTRAMUSCULAR | Status: AC | PRN
  Administered 2018-06-26 (×2): 1 mg via INTRAVENOUS

## 2018-06-26 MED FILL — FOLIC ACID 1 MG TABS: 1 | 30 days supply | Qty: 30 | Fill #3

## 2018-06-26 NOTE — Discharge Instructions (Signed)
Moderate Conscious Sedation, Adult, Care After °These instructions provide you with information about caring for yourself after your procedure. Your health care provider may also give you more specific instructions. Your treatment has been planned according to current medical practices, but problems sometimes occur. Call your health care provider if you have any problems or questions after your procedure. °What can I expect after the procedure? °After your procedure, it is common: °· To feel sleepy for several hours. °· To feel clumsy and have poor balance for several hours. °· To have poor judgment for several hours. °· To vomit if you eat too soon. ° °Follow these instructions at home: °For at least 24 hours after the procedure: ° °· Do not: °? Participate in activities where you could fall or become injured. °? Drive. °? Use heavy machinery. °? Drink alcohol. °? Take sleeping pills or medicines that cause drowsiness. °? Make important decisions or sign legal documents. °? Take care of children on your own. °· Rest. °Eating and drinking °· Follow the diet recommended by your health care provider. °· If you vomit: °? Drink water, juice, or soup when you can drink without vomiting. °? Make sure you have little or no nausea before eating solid foods. °General instructions °· Have a responsible adult stay with you until you are awake and alert. °· Take over-the-counter and prescription medicines only as told by your health care provider. °· If you smoke, do not smoke without supervision. °· Keep all follow-up visits as told by your health care provider. This is important. °Contact a health care provider if: °· You keep feeling nauseous or you keep vomiting. °· You feel light-headed. °· You develop a rash. °· You have a fever. °Get help right away if: °· You have trouble breathing. °This information is not intended to replace advice given to you by your health care provider. Make sure you discuss any questions you have  with your health care provider. °Document Released: 06/19/2013 Document Revised: 02/01/2016 Document Reviewed: 12/19/2015 °Elsevier Interactive Patient Education © 2018 [employer] ° ° °Implanted Port Insertion, Care After °This sheet gives you information about how to care for yourself after your procedure. Your health care provider may also give you more specific instructions. If you have problems or questions, contact your health care provider. °What can I expect after the procedure? °After your procedure, it is common to have: °· Discomfort at the port insertion site. °· Bruising on the skin over the port. This should improve over 3-4 days. ° °Follow these instructions at home: °Port care °· After your port is placed, you will get a manufacturer's information card. The card has information about your port. Keep this card with you at all times. °· Take care of the port as told by your health care provider. Ask your health care provider if you or a family member can get training for taking care of the port at home. A home health care nurse may also take care of the port. °· Make sure to remember what type of port you have. °Incision care °· Follow instructions from your health care provider about how to take care of your port insertion site. Make sure you: °? Wash your hands with soap and water before you change your bandage (dressing). If soap and water are not available, use hand sanitizer. °? Change your dressing as told by your health care provider.  You may remove your dressing tomorrow. °? Leave skin glue in place. These skin closures   may need to stay in place for 2 weeks or longer. If adhesive strip edges start to loosen and curl up, you may trim the loose edges. Do not remove adhesive strips completely unless your health care provider tells you to do that.  DO NOT use EMLA cream for 2 weeks after port placement as this cream will remove surgical glue on your incision. °· Check your port insertion site  every day for signs of infection. Check for: °? More redness, swelling, or pain. °? More fluid or blood. °? Warmth. °? Pus or a bad smell. °General instructions °· Do not take baths, swim, or use a hot tub until your health care provider approves.  You may shower tomorrow. °· Do not lift anything that is heavier than 10 lb (4.5 kg) for a week, or as told by your health care provider. °· Ask your health care provider when it is okay to: °? Return to work or school. °? Resume usual physical activities or sports. °· Do not drive for 24 hours if you were given a medicine to help you relax (sedative). °· Take over-the-counter and prescription medicines only as told by your health care provider. °· Wear a medical alert bracelet in case of an emergency. This will tell any health care providers that you have a port. °· Keep all follow-up visits as told by your health care provider. This is important. °Contact a health care provider if: °· You cannot flush your port with saline as directed, or you cannot draw blood from the port. °· You have a fever or chills. °· You have more redness, swelling, or pain around your port insertion site. °· You have more fluid or blood coming from your port insertion site. °· Your port insertion site feels warm to the touch. °· You have pus or a bad smell coming from the port insertion site. °Get help right away if: °· You have chest pain or shortness of breath. °· You have bleeding from your port that you cannot control. °Summary °· Take care of the port as told by your health care provider. °· Change your dressing as told by your health care provider. °· Keep all follow-up visits as told by your health care provider. °This information is not intended to replace advice given to you by your health care provider. Make sure you discuss any questions you have with your health care provider. °Document Released: 06/19/2013 Document Revised: 07/20/2016 Document Reviewed: 07/20/2016 °Elsevier  Interactive Patient Education © 2017 Else[employer] Inc. ° ° °

## 2018-06-26 NOTE — Procedures (Signed)
Interventional Radiology Procedure Note  Procedure: Placement of a right IJ approach single lumen PowerPort.  Tip is positioned at the superior cavoatrial junction and catheter is ready for immediate use.  Complications: None Recommendations:  - Ok to shower tomorrow - Do not submerge for 7 days - Routine line care   Signed,  Sharna Gabrys S. Bettyjane Shenoy, DO   

## 2018-06-26 NOTE — H&P (Signed)
Chief Complaint: Patient was seen in consultation today for port a cath placement  Referring Physician(s): Sun,Vyvyan  Supervising Physician: Corrie Mckusick  Patient Status: [facility] - Out-pt  History of Present Illness: Rebecca Lucas is a 62 y.o. 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 Julien Nordmann and has undergone 4 cycles of chemotherapy per her report. She presents to IR today for port placement to continue chemotherapy.  She reports that she feels well overall, some fatigue but continues with regular daily activities. She reports some weight loss which has stabilized. She denies any other complaints  No past medical history on file.  No past surgical history on file.  Allergies: Patient has no known allergies.  Medications: Prior to Admission medications   Medication Sig Start Date End Date Taking? Authorizing Provider  acetaminophen (TYLENOL) 500 MG tablet Take 1,000 mg by mouth every 6 (six) hours as needed for mild pain.     [provider]  alendronate (FOSAMAX) 70 MG tablet Take 70 mg by mouth once a week. Wednesday 01/12/18   [provider]  butalbital-aspirin-caffeine Acquanetta Chain) 50-325-40 MG capsule Take 1 capsule by mouth every 6 (six) hours as needed for migraine.  12/05/17   [provider]  Calcium Carbonate-Vitamin D (CALCIUM-D PO) Take 1 tablet by mouth 2 (two) times daily.    [provider]  fluconazole (DIFLUCAN) 200 MG tablet Take 1 tablet (200 mg total) by mouth daily. 04/11/18   Tanner, Lyndon Code., PA-C  folic acid (FOLVITE) 1 MG tablet Take 1 tablet (1 mg total) by mouth daily. 04/04/18   Curt Bears, MD  ibuprofen (ADVIL,MOTRIN) 200 MG tablet Take 600 mg by mouth every 6 (six) hours as needed for mild pain.     [provider]  lidocaine-prilocaine (EMLA) cream Apply 1 application topically as needed.  06/13/18   Curt Bears, MD  linaclotide [facility]) 72 MCG capsule Take 1 capsule (72 mcg total) by mouth daily before breakfast. 04/13/18   Tanner, Lyndon Code., PA-C  lubiprostone (AMITIZA) 24 MCG capsule Take 1 capsule (24 mcg total) by mouth 2 (two) times daily with a meal. 04/11/18   Tanner, Lyndon Code., PA-C  LUTEIN PO Take 1 tablet by mouth daily.    [provider]  magic mouthwash SOLN Take 5 mLs by mouth 4 (four) times daily as needed for mouth pain. 04/11/18   Tanner, Lyndon Code., PA-C  omeprazole (PRILOSEC) 20 MG capsule Take 1 capsule (20 mg total) by mouth daily. 06/13/18   Curt Bears, MD  ondansetron (ZOFRAN) 8 MG tablet Take 1 tablet (8 mg total) by mouth every 8 (eight) hours as needed for nausea or vomiting. 05/28/18   Curt Bears, MD  prochlorperazine (COMPAZINE) 10 MG tablet Take 1 tablet (10 mg total) by mouth every 6 (six) hours as needed for nausea or vomiting. 04/02/18   Hayden Pedro, PA-C  sucralfate (CARAFATE) 1 GM/10ML suspension Take 10 mLs (1 g total) by mouth 4 (four) times daily -  with meals and at bedtime. 05/02/18   Maryanna Shape, NP  traMADol (ULTRAM) 50 MG tablet Take 1 tablet (50 mg total) by mouth every 12 (twelve) hours as needed for moderate pain. 06/13/18   Curt Bears, MD  zolpidem (AMBIEN) 10 MG tablet Take 1 tablet (10 mg total) by mouth at bedtime as needed for sleep. 03/21/18   Curt Bears,  MD     Family History  Problem Relation Age of Onset  . ALS Mother 61       non genetic  . Cancer Neg Hx     Social History   Socioeconomic History  . Marital status: Married    Spouse name: Not on file  . Number of children: Not on file  . Years of education: Not on file  . Highest education level: Not on file  Occupational History  . Not on file  Social Needs  . Financial resource strain: Not on file  . Food insecurity:    Worry: Not on file    Inability: Not on file  . Transportation needs:    Medical: Not on file     Non-medical: Not on file  Tobacco Use  . Smoking status: Never Smoker  . Smokeless tobacco: Never Used  Substance and Sexual Activity  . Alcohol use: Yes    Comment: occasionally  . Drug use: Never  . Sexual activity: Yes  Lifestyle  . Physical activity:    Days per week: Not on file    Minutes per session: Not on file  . Stress: Not on file  Relationships  . Social connections:    Talks on phone: Not on file    Gets together: Not on file    Attends religious service: Not on file    Active member of club or organization: Not on file    Attends meetings of clubs or organizations: Not on file    Relationship status: Not on file  Other Topics Concern  . Not on file  Social History Narrative   Married with two children. Her husband is Dr. [name], a physician with occupational health. She has worked several jobs Energy manager, Glass blower/designer, and now the [employer].      Review of Systems: A 12 point ROS discussed and pertinent positives are indicated in the HPI above.  All other systems are negative.  Review of Systems  Constitutional: Positive for fatigue. Negative for activity change, appetite change, chills and fever.  Respiratory: Negative for cough and shortness of breath.   Cardiovascular: Negative for chest pain.  Gastrointestinal: Negative for abdominal pain, nausea and vomiting.  Neurological: Negative for dizziness and light-headedness.  Psychiatric/Behavioral: Negative for confusion.    Vital Signs: There were no vitals taken for this visit.  Physical Exam  Constitutional: She is oriented to person, place, and time. No distress.  HENT:  Head: Normocephalic.  Cardiovascular: Normal rate, regular rhythm and normal heart sounds.  Pulmonary/Chest: Effort normal and breath sounds normal.  Abdominal: Soft. She exhibits no distension. There is no tenderness.  Neurological: She is alert and oriented to person, place, and time.  Skin: Skin  is warm and dry. She is not diaphoretic.  Psychiatric: She has a normal mood and affect. Her behavior is normal. Judgment and thought content normal.     MD Evaluation Airway: WNL Heart: WNL Abdomen: WNL Chest/ Lungs: WNL ASA  Classification: 3 Mallampati/Airway Score: One   Imaging: No results found.  Labs:  CBC: Recent Labs    06/06/18 1458 06/13/18 1038 06/20/18 1504 06/26/18 1012  WBC 3.2* 3.7* 1.9* 3.3*  HGB 10.0* 11.1* 10.2* 10.4*  HCT 29.9* 34.7* 31.2* 32.4*  PLT 334 240 184 207    COAGS: Recent Labs    03/08/18 0936 03/22/18 1159  INR 1.00 1.14  APTT  --  35    BMP: Recent Labs  05/30/18 1454 06/06/18 1458 06/13/18 1038 06/20/18 1504  NA 137 138 141 136  K 3.8 4.5 4.1 3.8  CL 102 104 107 102  CO2 26 26 27 24   GLUCOSE 87 102* 99 90  BUN 17 15 13 20   CALCIUM 9.6 9.1 9.4 9.3  CREATININE 0.69 0.63 0.70 0.69  GFRNONAA >60 >60 >60 >60  GFRAA >60 >60 >60 >60    LIVER FUNCTION TESTS: Recent Labs    05/30/18 1454 06/06/18 1458 06/13/18 1038 06/20/18 1504  BILITOT <0.2* <0.2* <0.2* 0.3  AST 47* 42* 41 42*  ALT 73* 58* 47* 56*  ALKPHOS 81 67 66 66  PROT 7.0 7.0 7.1 7.2  ALBUMIN 3.5 3.6 3.5 3.7    TUMOR MARKERS: No results for input(s): AFPTM, CEA, CA199, CHROMGRNA in the last 8760 hours.  Assessment and Plan:  Patient with diagnosis of metastatic non-small cell carcinoma currently undergoing chemotherapy who presents today for port placement to continue chemotherapy. She denies any complaints today.  She is afebrile, NPO since yesterday evening, she does not take blood thinning medication, WBC 3.3, H/H 10.4/32.4 which is near baseline for her, INR pending.   Risks and benefits of image guided port-a-catheter placement was discussed with the patient including, but not limited to bleeding, infection, pneumothorax, or fibrin sheath development and need for additional procedures.  All of the patient's questions were answered, patient is  agreeable to proceed.  Consent signed and in chart.  Thank you for this interesting consult.  I greatly enjoyed meeting SANAM MARMO and look forward to participating in their care.  A copy of this report was sent to the requesting provider on this date.  Electronically Signed: Joaquim Nam, PA-C 06/26/2018, 10:42 AM   I spent a total of  15 Minutes  in face to face in clinical consultation, greater than 50% of which was counseling/coordinating care for port placement.

## 2018-06-27 ENCOUNTER — Inpatient Hospital Stay: Payer: 59

## 2018-06-27 DIAGNOSIS — C7951 Secondary malignant neoplasm of bone: Secondary | ICD-10-CM | POA: Diagnosis not present

## 2018-06-27 DIAGNOSIS — C7802 Secondary malignant neoplasm of left lung: Secondary | ICD-10-CM | POA: Diagnosis not present

## 2018-06-27 DIAGNOSIS — R52 Pain, unspecified: Secondary | ICD-10-CM | POA: Diagnosis not present

## 2018-06-27 DIAGNOSIS — Z5112 Encounter for antineoplastic immunotherapy: Secondary | ICD-10-CM | POA: Diagnosis not present

## 2018-06-27 DIAGNOSIS — C801 Malignant (primary) neoplasm, unspecified: Secondary | ICD-10-CM | POA: Diagnosis not present

## 2018-06-27 DIAGNOSIS — K219 Gastro-esophageal reflux disease without esophagitis: Secondary | ICD-10-CM | POA: Diagnosis not present

## 2018-06-27 DIAGNOSIS — R5383 Other fatigue: Secondary | ICD-10-CM | POA: Diagnosis not present

## 2018-06-27 DIAGNOSIS — C7801 Secondary malignant neoplasm of right lung: Secondary | ICD-10-CM | POA: Diagnosis not present

## 2018-06-27 DIAGNOSIS — Z5111 Encounter for antineoplastic chemotherapy: Secondary | ICD-10-CM | POA: Diagnosis not present

## 2018-06-27 LAB — CMP (CANCER CENTER ONLY)
ALT: 34 U/L (ref 0–44)
AST: 33 U/L (ref 15–41)
Albumin: 3.6 g/dL (ref 3.5–5.0)
Alkaline Phosphatase: 64 U/L (ref 38–126)
Anion gap: 11 (ref 5–15)
BUN: 14 mg/dL (ref 6–20)
CHLORIDE: 104 mmol/L (ref 98–111)
CO2: 25 mmol/L (ref 22–32)
Calcium: 9.3 mg/dL (ref 8.9–10.3)
Creatinine: 0.72 mg/dL (ref 0.44–1.00)
GFR, Est AFR Am: >60 mL/min (ref >60)
GFR, Estimated: >60 mL/min (ref >60)
Glucose, Bld: 92 mg/dL (ref 70–99)
POTASSIUM: 3.8 mmol/L (ref 3.5–5.1)
Sodium: 140 mmol/L (ref 135–145)
TOTAL PROTEIN: 7.2 g/dL (ref 6.5–8.1)

## 2018-06-27 LAB — CBC WITH DIFFERENTIAL (CANCER CENTER ONLY)
ABS IMMATURE GRANULOCYTES: 0.02 10*3/uL (ref 0.00–0.07)
BASOS ABS: 0 10*3/uL (ref 0.0–0.1)
BASOS PCT: 1 %
Eosinophils Absolute: 0.1 10*3/uL (ref 0.0–0.5)
Eosinophils Relative: 2 %
HCT: 31.4 % — ABNORMAL LOW (ref 36.0–46.0)
Hemoglobin: 9.9 g/dL — ABNORMAL LOW (ref 12.0–15.0)
Immature Granulocytes: 1 %
LYMPHS PCT: 16 %
Lymphs Abs: 0.5 10*3/uL — ABNORMAL LOW (ref 0.7–4.0)
MCH: 30.3 pg (ref 26.0–34.0)
MCHC: 31.5 g/dL (ref 30.0–36.0)
MCV: 96 fL (ref 80.0–100.0)
MONO ABS: 0.4 10*3/uL (ref 0.1–1.0)
Monocytes Relative: 13 %
NEUTROS ABS: 2.1 10*3/uL (ref 1.7–7.7)
NEUTROS PCT: 67 %
NRBC: 0 % (ref 0.0–0.2)
PLATELETS: 199 10*3/uL (ref 150–400)
RBC: 3.27 MIL/uL — AB (ref 3.87–5.11)
RDW: 18.7 % — ABNORMAL HIGH (ref 11.5–15.5)
WBC Count: 3.1 10*3/uL — ABNORMAL LOW (ref 4.0–10.5)

## 2018-06-28 ENCOUNTER — Ambulatory Visit (HOSPITAL_COMMUNITY)
Admission: RE | Admit: 2018-06-28 | Discharge: 2018-06-28 | Disposition: A | Discharge status: Home / Self Care | Payer: 59 | Source: Ambulatory Visit | Attending: Internal Medicine | Admitting: Internal Medicine

## 2018-06-28 ENCOUNTER — Encounter (HOSPITAL_COMMUNITY): Payer: Self-pay

## 2018-06-28 ENCOUNTER — Telehealth: Payer: Self-pay | Admitting: Internal Medicine

## 2018-06-28 DIAGNOSIS — C7972 Secondary malignant neoplasm of left adrenal gland: Secondary | ICD-10-CM | POA: Diagnosis not present

## 2018-06-28 DIAGNOSIS — Z85118 Personal history of other malignant neoplasm of bronchus and lung: Secondary | ICD-10-CM | POA: Diagnosis not present

## 2018-06-28 DIAGNOSIS — C7951 Secondary malignant neoplasm of bone: Secondary | ICD-10-CM | POA: Insufficient documentation

## 2018-06-28 DIAGNOSIS — C3490 Malignant neoplasm of unspecified part of unspecified bronchus or lung: Secondary | ICD-10-CM | POA: Diagnosis not present

## 2018-06-28 MED ORDER — SODIUM CHLORIDE 0.9 % IJ SOLN
INTRAMUSCULAR | Status: AC
  Filled 2018-06-28: qty 50

## 2018-06-28 MED ORDER — IOHEXOL 300 MG/ML  SOLN
100.0000 mL | Freq: Once | INTRAMUSCULAR | Status: AC | PRN
  Administered 2018-06-28: 100 mL via INTRAVENOUS

## 2018-06-28 NOTE — Telephone Encounter (Signed)
Appts scheduled/adjusted and LMVM for patient with updated time per 10/17 sch msg

## 2018-07-04 ENCOUNTER — Inpatient Hospital Stay: Payer: 59

## 2018-07-04 ENCOUNTER — Encounter: Payer: Self-pay | Admitting: Internal Medicine

## 2018-07-04 ENCOUNTER — Inpatient Hospital Stay: Payer: 59 | Admitting: Nutrition

## 2018-07-04 ENCOUNTER — Inpatient Hospital Stay (HOSPITAL_BASED_OUTPATIENT_CLINIC_OR_DEPARTMENT_OTHER): Payer: 59 | Admitting: Internal Medicine

## 2018-07-04 VITALS — BP 121/64 | HR 94 | Temp 98.3°F | Resp 18 | Ht 67.0 in | Wt 118.0 lb

## 2018-07-04 DIAGNOSIS — C3411 Malignant neoplasm of upper lobe, right bronchus or lung: Secondary | ICD-10-CM

## 2018-07-04 DIAGNOSIS — C7951 Secondary malignant neoplasm of bone: Secondary | ICD-10-CM | POA: Diagnosis not present

## 2018-07-04 DIAGNOSIS — C801 Malignant (primary) neoplasm, unspecified: Secondary | ICD-10-CM

## 2018-07-04 DIAGNOSIS — C799 Secondary malignant neoplasm of unspecified site: Secondary | ICD-10-CM

## 2018-07-04 DIAGNOSIS — C7802 Secondary malignant neoplasm of left lung: Secondary | ICD-10-CM | POA: Diagnosis not present

## 2018-07-04 DIAGNOSIS — Z5112 Encounter for antineoplastic immunotherapy: Secondary | ICD-10-CM | POA: Diagnosis not present

## 2018-07-04 DIAGNOSIS — Z5111 Encounter for antineoplastic chemotherapy: Secondary | ICD-10-CM

## 2018-07-04 DIAGNOSIS — C7989 Secondary malignant neoplasm of other specified sites: Secondary | ICD-10-CM

## 2018-07-04 DIAGNOSIS — C7801 Secondary malignant neoplasm of right lung: Secondary | ICD-10-CM | POA: Diagnosis not present

## 2018-07-04 DIAGNOSIS — R5383 Other fatigue: Secondary | ICD-10-CM | POA: Diagnosis not present

## 2018-07-04 DIAGNOSIS — R52 Pain, unspecified: Secondary | ICD-10-CM | POA: Diagnosis not present

## 2018-07-04 DIAGNOSIS — K219 Gastro-esophageal reflux disease without esophagitis: Secondary | ICD-10-CM | POA: Diagnosis not present

## 2018-07-04 LAB — CMP (CANCER CENTER ONLY)
ALT: 43 U/L (ref 0–44)
AST: 35 U/L (ref 15–41)
Albumin: 3.4 g/dL — ABNORMAL LOW (ref 3.5–5.0)
Alkaline Phosphatase: 64 U/L (ref 38–126)
Anion gap: 11 (ref 5–15)
BUN: 17 mg/dL (ref 6–20)
CALCIUM: 9.4 mg/dL (ref 8.9–10.3)
CO2: 23 mmol/L (ref 22–32)
CREATININE: 0.71 mg/dL (ref 0.44–1.00)
Chloride: 106 mmol/L (ref 98–111)
GFR, Est AFR Am: >60 mL/min (ref >60)
GFR, Estimated: >60 mL/min (ref >60)
GLUCOSE: 115 mg/dL — AB (ref 70–99)
Potassium: 4 mmol/L (ref 3.5–5.1)
SODIUM: 140 mmol/L (ref 135–145)
Total Bilirubin: <0.2 mg/dL — ABNORMAL LOW (ref 0.3–1.2)
Total Protein: 7.1 g/dL (ref 6.5–8.1)

## 2018-07-04 LAB — CBC WITH DIFFERENTIAL (CANCER CENTER ONLY)
Abs Immature Granulocytes: 0.02 10*3/uL (ref 0.00–0.07)
Basophils Absolute: 0 10*3/uL (ref 0.0–0.1)
Basophils Relative: 0 %
EOS ABS: 0 10*3/uL (ref 0.0–0.5)
Eosinophils Relative: 2 %
HEMATOCRIT: 32 % — AB (ref 36.0–46.0)
HEMOGLOBIN: 10.5 g/dL — AB (ref 12.0–15.0)
IMMATURE GRANULOCYTES: 1 %
Lymphocytes Relative: 16 %
Lymphs Abs: 0.4 10*3/uL — ABNORMAL LOW (ref 0.7–4.0)
MCH: 31.3 pg (ref 26.0–34.0)
MCHC: 32.8 g/dL (ref 30.0–36.0)
MCV: 95.5 fL (ref 80.0–100.0)
MONOS PCT: 16 %
Monocytes Absolute: 0.4 10*3/uL (ref 0.1–1.0)
NEUTROS PCT: 65 %
Neutro Abs: 1.7 10*3/uL (ref 1.7–7.7)
Platelet Count: 225 10*3/uL (ref 150–400)
RBC: 3.35 MIL/uL — ABNORMAL LOW (ref 3.87–5.11)
RDW: 18.2 % — AB (ref 11.5–15.5)
WBC Count: 2.6 10*3/uL — ABNORMAL LOW (ref 4.0–10.5)
nRBC: 0 % (ref 0.0–0.2)

## 2018-07-04 LAB — TSH: TSH: 1.723 u[IU]/mL (ref 0.308–3.960)

## 2018-07-04 MED ORDER — DEXAMETHASONE SODIUM PHOSPHATE 10 MG/ML IJ SOLN
INTRAMUSCULAR | Status: AC
  Filled 2018-07-04: qty 1

## 2018-07-04 MED ORDER — SODIUM CHLORIDE 0.9 % IV SOLN: Freq: Once | INTRAVENOUS | Status: DC

## 2018-07-04 MED ORDER — ONDANSETRON HCL 4 MG/2ML IJ SOLN
8.0000 mg | Freq: Once | INTRAMUSCULAR | Status: AC
  Administered 2018-07-04: 8 mg via INTRAVENOUS

## 2018-07-04 MED ORDER — SODIUM CHLORIDE 0.9% FLUSH
10.0000 mL | INTRAVENOUS | Status: DC | PRN
  Administered 2018-07-04: 10 mL
  Filled 2018-07-04: qty 10

## 2018-07-04 MED ORDER — ONDANSETRON HCL 4 MG/2ML IJ SOLN
INTRAMUSCULAR | Status: AC
  Filled 2018-07-04: qty 4

## 2018-07-04 MED ORDER — SODIUM CHLORIDE 0.9 % IV SOLN
495.0000 mg/m2 | Freq: Once | INTRAVENOUS | Status: AC
  Administered 2018-07-04: 800 mg via INTRAVENOUS
  Filled 2018-07-04: qty 20

## 2018-07-04 MED ORDER — SODIUM CHLORIDE 0.9 % IV SOLN
Freq: Once | INTRAVENOUS | Status: AC
  Administered 2018-07-04: 13:00:00 via INTRAVENOUS
  Filled 2018-07-04: qty 250

## 2018-07-04 MED ORDER — HEPARIN SOD (PORK) LOCK FLUSH 100 UNIT/ML IV SOLN
500.0000 [IU] | Freq: Once | INTRAVENOUS | Status: AC | PRN
  Administered 2018-07-04: 500 [IU]
  Filled 2018-07-04: qty 5

## 2018-07-04 MED ORDER — SODIUM CHLORIDE 0.9 % IV SOLN
200.0000 mg | Freq: Once | INTRAVENOUS | Status: AC
  Administered 2018-07-04: 200 mg via INTRAVENOUS
  Filled 2018-07-04: qty 8

## 2018-07-04 MED ORDER — DEXAMETHASONE SODIUM PHOSPHATE 10 MG/ML IJ SOLN
10.0000 mg | Freq: Once | INTRAMUSCULAR | Status: AC
  Administered 2018-07-04: 10 mg via INTRAVENOUS

## 2018-07-04 NOTE — Progress Notes (Signed)
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  Metastatic sarcoma cannot be also excluded at this point. Molecular studies by foundation 1 showed no actionable mutations. PDL 1 expression 99%  PRIOR THERAPY:Palliative radiotherapy to the neck mass and hip lesion under the care of Dr. Tammi Klippel   CURRENT THERAPY: Systemic chemotherapy with carboplatin for AUC of 5, Alimta 500 mg/M2 and Keytruda 200 mg IV every 3 weeks.  First dose expected April 10, 2018.  Status post 4 cycles.  INTERVAL HISTORY: Rebecca Lucas 62 y.o. female returns to the clinic today for follow-up visit accompanied by her husband.  The patient is feeling fine today with no specific complaints.  She continues to take tramadol for pain as needed.  She denied having any current chest pain, shortness of breath, cough or hemoptysis.  She denied having any fever or chills.  She has no nausea, vomiting, diarrhea or constipation.  She has no headache or visual changes.  She continues to tolerate her treatment with carboplatin, Alimta and Ketruda (pembrolizumab) fairly well.  She had repeat CT scan of the chest, abdomen and pelvis performed recently and she is here for evaluation and discussion of her risk her results.  MEDICAL HISTORY:No past medical history on file.  ALLERGIES:  has No Known Allergies.  MEDICATIONS:  Current Outpatient Medications  Medication Sig Dispense Refill  . acetaminophen (TYLENOL) 500 MG tablet Take 1,000 mg by mouth every 6 (six) hours as needed for mild pain.     Marland Kitchen alendronate (FOSAMAX) 70 MG tablet Take 70 mg by mouth once a week. Wednesday  3  .  butalbital-aspirin-caffeine (FIORINAL) 50-325-40 MG capsule Take 1 capsule by mouth every 6 (six) hours as needed for migraine.   1  . Calcium Carbonate-Vitamin D (CALCIUM-D PO) Take 1 tablet by mouth 2 (two) times daily.    . folic acid (FOLVITE) 1 MG tablet Take 1 tablet (1 mg total) by mouth daily. 30 tablet 4  . ibuprofen (ADVIL,MOTRIN) 200 MG tablet Take 600 mg by mouth every 6 (six) hours as needed for mild pain.     Marland Kitchen lidocaine-prilocaine (EMLA) cream Apply 1 application topically as needed. 30 g 0  . linaclotide (LINZESS) 72 MCG capsule Take 1 capsule (72 mcg total) by mouth daily before breakfast. 30 capsule 2  . LUTEIN PO Take 1 tablet by mouth daily.    Marland Kitchen omeprazole (PRILOSEC) 20 MG capsule Take 1 capsule (20 mg total) by mouth daily. 30 capsule 2  . traMADol (ULTRAM) 50 MG tablet Take 1 tablet (50 mg total) by mouth every 12 (twelve) hours as needed for moderate pain. 60 tablet 0  . zolpidem (AMBIEN) 10 MG tablet Take 1 tablet (10 mg total) by mouth at bedtime as needed for sleep. 30 tablet 0  . fluconazole (DIFLUCAN) 200 MG tablet Take 1 tablet (200 mg total) by mouth daily. (Patient not taking: Reported on 07/04/2018) 5 tablet 0  . lubiprostone (AMITIZA) 24 MCG capsule Take 1 capsule (24 mcg total) by mouth 2 (two) times daily with a meal. (Patient not taking: Reported on 07/04/2018) 60 capsule 2  . magic mouthwash SOLN Take 5 mLs by mouth 4 (  four) times daily as needed for mouth pain. (Patient not taking: Reported on 07/04/2018) 240 mL 2  . ondansetron (ZOFRAN) 8 MG tablet Take 1 tablet (8 mg total) by mouth every 8 (eight) hours as needed for nausea or vomiting. (Patient not taking: Reported on 07/04/2018) 20 tablet 0  . prochlorperazine (COMPAZINE) 10 MG tablet Take 1 tablet (10 mg total) by mouth every 6 (six) hours as needed for nausea or vomiting. (Patient not taking: Reported on 07/04/2018) 30 tablet 0  . sucralfate (CARAFATE) 1 GM/10ML suspension Take 10 mLs (1 g total) by mouth  4 (four) times daily -  with meals and at bedtime. (Patient not taking: Reported on 07/04/2018) 420 mL 0   No current facility-administered medications for this visit.     SURGICAL HISTORY:  Past Surgical History:  Procedure Laterality Date  . IR IMAGING GUIDED PORT INSERTION  06/26/2018    REVIEW OF SYSTEMS:  Constitutional: positive for fatigue Eyes: negative Ears, nose, mouth, throat, and face: negative Respiratory: negative Cardiovascular: negative Gastrointestinal: negative Genitourinary:negative Integument/breast: negative Hematologic/lymphatic: negative Musculoskeletal:positive for back pain Neurological: negative Behavioral/Psych: negative Endocrine: negative Allergic/Immunologic: negative   PHYSICAL EXAMINATION: General appearance: alert, cooperative, fatigued and no distress Head: Normocephalic, without obvious abnormality, atraumatic Neck: no adenopathy, no JVD, supple, symmetrical, trachea midline and thyroid not enlarged, symmetric, no tenderness/mass/nodules Lymph nodes: Cervical, supraclavicular, and axillary nodes normal. Resp: clear to auscultation bilaterally Back: symmetric, no curvature. ROM normal. No CVA tenderness. Cardio: regular rate and rhythm, S1, S2 normal, no murmur, click, rub or gallop GI: soft, non-tender; bowel sounds normal; no masses,  no organomegaly Extremities: extremities normal, atraumatic, no cyanosis or edema Neurologic: Alert and oriented X 3, normal strength and tone. Normal symmetric reflexes. Normal coordination and gait  ECOG PERFORMANCE STATUS: 1 - Symptomatic but completely ambulatory  Blood pressure 121/64, pulse 94, temperature 98.3 F (36.8 C), temperature source Oral, resp. rate 18, height 5\' 7"  (1.702 m), weight 118 lb (53.5 kg), SpO2 100 %.  LABORATORY DATA: Lab Results  Component Value Date   WBC 3.1 (L) 06/27/2018   HGB 9.9 (L) 06/27/2018   HCT 31.4 (L) 06/27/2018   MCV 96.0 06/27/2018   PLT 199 06/27/2018       Chemistry      Component Value Date/Time   NA 140 06/27/2018 1450   K 3.8 06/27/2018 1450   CL 104 06/27/2018 1450   CO2 25 06/27/2018 1450   BUN 14 06/27/2018 1450   CREATININE 0.72 06/27/2018 1450      Component Value Date/Time   CALCIUM 9.3 06/27/2018 1450   ALKPHOS 64 06/27/2018 1450   AST 33 06/27/2018 1450   ALT 34 06/27/2018 1450   BILITOT <0.2 (L) 06/27/2018 1450       RADIOGRAPHIC STUDIES: Ct Chest W Contrast  Result Date: 06/28/2018 CLINICAL DATA:  Patient with history of metastatic non-small cell carcinoma. Follow-up exam. EXAM: CT CHEST, ABDOMEN, AND PELVIS WITH CONTRAST TECHNIQUE: Multidetector CT imaging of the chest, abdomen and pelvis was performed following the standard protocol during bolus administration of intravenous contrast. CONTRAST:  126mL OMNIPAQUE IOHEXOL 300 MG/ML  SOLN COMPARISON:  CT CAP 05/10/2018 FINDINGS: CT CHEST FINDINGS Cardiovascular: Right anterior chest wall Port-A-Cath with tip terminating in the superior vena cava. Normal heart size. Trace fluid superior pericardial recess. Thoracic aortic vascular calcifications. Mediastinum/Nodes: Unchanged 8 mm precarinal node (image 25; series 2). No enlarged axillary, mediastinal or hilar lymphadenopathy. Normal appearance of the esophagus. Lungs/Pleura: Central airways are patent. Dependent  atelectasis in the bilateral lower lobes. Right upper lobe mass mildly decreased in size when compared to prior measuring 4.5 x 3.4 x 3.6 cm cm (image 49; series 4), previously 4.8 x 3.6 x 3.9 cm. No new or enlarging pulmonary nodules or masses. No large pleural effusion or pneumothorax. Musculoskeletal: Interval healing fracture of the lateral left seventh rib (image 23; series 6). Persistent displaced left fourth rib fracture (image 23; series 6). CT ABDOMEN PELVIS FINDINGS Hepatobiliary: Liver is normal in size and contour. No focal hepatic lesions identified. Gallbladder is unremarkable. No intrahepatic or  extrahepatic biliary ductal dilatation. Pancreas: Unremarkable Spleen: Unremarkable Adrenals/Urinary Tract: Decreased left adrenal gland metastasis measuring 1.5 x 1.0 cm (image 58; series 2), previously 2.1 x 1.1 cm. Kidneys enhance symmetrically with contrast. No hydronephrosis. Urinary bladder is unremarkable. Stomach/Bowel: Oral contrast material to the level of the rectum. No abnormal bowel wall thickening or evidence for bowel obstruction. No free fluid or free intraperitoneal air. Normal morphology of the stomach. Vascular/Lymphatic: Normal caliber abdominal aorta. Peripheral calcified atherosclerotic plaque. No retroperitoneal lymphadenopathy. Reproductive: Uterus and adnexal structures unremarkable. Other: None. Musculoskeletal: Right hemi sacral lesion measures approximately 2.9 x 2.7 cm (image 90; series 2), similar size when compared to prior however there is mild interval central sclerosis of the lesion. Similar-appearing 2.9 x 2.0 cm left posterior iliac lesion (image 91; series 2). Similar-appearing 1.3 cm lytic right iliac lesion (image 90; series 2). Similar-appearing 8 mm sclerotic lesion posterior left acetabulum (image 108; series 2). Similar-appearing 2.2 cm right ischial lesion (image 119; series 2). Similar-appearing compression deformity of the L2 vertebral body. Similar to mild interval increase in size and sclerosis of mixed lucent and sclerotic lesion within the anterior inferior T10 vertebral body (image 83; series 6) measuring 1.2 cm. Interval decrease in size of nodule posterior to the left psoas measuring 1.2 x 0.9 cm (image 85; series 2), previously 2.2 x 0.9 cm. IMPRESSION: 1. Minimal interval decrease in size of right upper lobe mass. 2. Similar-appearing osseous metastatic disease. Lesion within the right hemi sacrum appears to have mild interval central sclerosis. Lesion within the anterior inferior T10 vertebral body minimally changed to mildly more sclerotic when compared to  prior exam. 3. Interval decrease in size of lesion posterior to the left psoas. 4. Slight interval decrease in size of left adrenal metastasis. Electronically Signed   By: Lovey Newcomer M.D.   On: 06/28/2018 13:47   Ct Abdomen Pelvis W Contrast  Result Date: 06/28/2018 CLINICAL DATA:  Patient with history of metastatic non-small cell carcinoma. Follow-up exam. EXAM: CT CHEST, ABDOMEN, AND PELVIS WITH CONTRAST TECHNIQUE: Multidetector CT imaging of the chest, abdomen and pelvis was performed following the standard protocol during bolus administration of intravenous contrast. CONTRAST:  167mL OMNIPAQUE IOHEXOL 300 MG/ML  SOLN COMPARISON:  CT CAP 05/10/2018 FINDINGS: CT CHEST FINDINGS Cardiovascular: Right anterior chest wall Port-A-Cath with tip terminating in the superior vena cava. Normal heart size. Trace fluid superior pericardial recess. Thoracic aortic vascular calcifications. Mediastinum/Nodes: Unchanged 8 mm precarinal node (image 25; series 2). No enlarged axillary, mediastinal or hilar lymphadenopathy. Normal appearance of the esophagus. Lungs/Pleura: Central airways are patent. Dependent atelectasis in the bilateral lower lobes. Right upper lobe mass mildly decreased in size when compared to prior measuring 4.5 x 3.4 x 3.6 cm cm (image 49; series 4), previously 4.8 x 3.6 x 3.9 cm. No new or enlarging pulmonary nodules or masses. No large pleural effusion or pneumothorax. Musculoskeletal: Interval healing fracture of the lateral  left seventh rib (image 23; series 6). Persistent displaced left fourth rib fracture (image 23; series 6). CT ABDOMEN PELVIS FINDINGS Hepatobiliary: Liver is normal in size and contour. No focal hepatic lesions identified. Gallbladder is unremarkable. No intrahepatic or extrahepatic biliary ductal dilatation. Pancreas: Unremarkable Spleen: Unremarkable Adrenals/Urinary Tract: Decreased left adrenal gland metastasis measuring 1.5 x 1.0 cm (image 58; series 2), previously 2.1 x 1.1  cm. Kidneys enhance symmetrically with contrast. No hydronephrosis. Urinary bladder is unremarkable. Stomach/Bowel: Oral contrast material to the level of the rectum. No abnormal bowel wall thickening or evidence for bowel obstruction. No free fluid or free intraperitoneal air. Normal morphology of the stomach. Vascular/Lymphatic: Normal caliber abdominal aorta. Peripheral calcified atherosclerotic plaque. No retroperitoneal lymphadenopathy. Reproductive: Uterus and adnexal structures unremarkable. Other: None. Musculoskeletal: Right hemi sacral lesion measures approximately 2.9 x 2.7 cm (image 90; series 2), similar size when compared to prior however there is mild interval central sclerosis of the lesion. Similar-appearing 2.9 x 2.0 cm left posterior iliac lesion (image 91; series 2). Similar-appearing 1.3 cm lytic right iliac lesion (image 90; series 2). Similar-appearing 8 mm sclerotic lesion posterior left acetabulum (image 108; series 2). Similar-appearing 2.2 cm right ischial lesion (image 119; series 2). Similar-appearing compression deformity of the L2 vertebral body. Similar to mild interval increase in size and sclerosis of mixed lucent and sclerotic lesion within the anterior inferior T10 vertebral body (image 83; series 6) measuring 1.2 cm. Interval decrease in size of nodule posterior to the left psoas measuring 1.2 x 0.9 cm (image 85; series 2), previously 2.2 x 0.9 cm. IMPRESSION: 1. Minimal interval decrease in size of right upper lobe mass. 2. Similar-appearing osseous metastatic disease. Lesion within the right hemi sacrum appears to have mild interval central sclerosis. Lesion within the anterior inferior T10 vertebral body minimally changed to mildly more sclerotic when compared to prior exam. 3. Interval decrease in size of lesion posterior to the left psoas. 4. Slight interval decrease in size of left adrenal metastasis. Electronically Signed   By: Lovey Newcomer M.D.   On: 06/28/2018 13:47    Ir Imaging Guided Port Insertion  Result Date: 06/26/2018 INDICATION: 62 year old female with a history of non-small cell carcinoma EXAM: IMPLANTED PORT A CATH PLACEMENT WITH ULTRASOUND AND FLUOROSCOPIC GUIDANCE MEDICATIONS: 2 g Ancef; The antibiotic was administered within an appropriate time interval prior to skin puncture. ANESTHESIA/SEDATION: Moderate (conscious) sedation was employed during this procedure. A total of Versed 2.0 mg and Fentanyl 100 mcg was administered intravenously. Moderate Sedation Time: 24 minutes. The patient's level of consciousness and vital signs were monitored continuously by radiology nursing throughout the procedure under my direct supervision. FLUOROSCOPY TIME:  0 minutes, 12 seconds (of 0.6 mGy) COMPLICATIONS: None immediate. PROCEDURE: The procedure, risks, benefits, and alternatives were explained to the patient. Questions regarding the procedure were encouraged and answered. The patient understands and consents to the procedure. Ultrasound survey was performed with images stored and sent to PACs. The right neck and chest was prepped with chlorhexidine, and draped in the usual sterile fashion using maximum barrier technique (cap and mask, sterile gown, sterile gloves, large sterile sheet, hand hygiene and cutaneous antiseptic). Antibiotic prophylaxis was provided with 2.0g Ancef administered IV one hour prior to skin incision. Local anesthesia was attained by infiltration with 1% lidocaine without epinephrine. Ultrasound demonstrated patency of the right internal jugular vein, and this was documented with an image. Under real-time ultrasound guidance, this vein was accessed with a 21 gauge micropuncture needle and image  documentation was performed. A small dermatotomy was made at the access site with an 11 scalpel. A 0.018" wire was advanced into the SVC and used to estimate the length of the internal catheter. The access needle exchanged for a 64F micropuncture vascular  sheath. The 0.018" wire was then removed and a 0.035" wire advanced into the IVC. An appropriate location for the subcutaneous reservoir was selected below the clavicle and an incision was made through the skin and underlying soft tissues. The subcutaneous tissues were then dissected using a combination of blunt and sharp surgical technique and a pocket was formed. A single lumen power injectable portacatheter was then tunneled through the subcutaneous tissues from the pocket to the dermatotomy and the port reservoir placed within the subcutaneous pocket. The venous access site was then serially dilated and a peel away vascular sheath placed over the wire. The wire was removed and the port catheter advanced into position under fluoroscopic guidance. The catheter tip is positioned in the cavoatrial junction. This was documented with a spot image. The portacatheter was then tested and found to flush and aspirate well. The port was flushed with saline followed by 100 units/mL heparinized saline. The pocket was then closed in two layers using first subdermal inverted interrupted absorbable sutures followed by a running subcuticular suture. The epidermis was then sealed with Dermabond. The dermatotomy at the venous access site was also seal with Dermabond. Patient tolerated the procedure well and remained hemodynamically stable throughout. No complications encountered and no significant blood loss encountered . IMPRESSION: Status post right IJ port catheter placement. Catheter ready for use. Signed, Dulcy Fanny. Dellia Nims, RPVI [facility] Radiology Electronically Signed   By: Corrie Mckusick D.O.   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 PDL 1 expression was 99%. The molecular studies by foundation 1 as well as [employer] showed no actionable mutations. The patient was a started on treatment with systemic chemotherapy with carboplatin for AUC of 5, Alimta 500 mg/M2 and Keytruda 200 mg IV every 3 weeks.  She is status post 4 cycles.  She has been tolerating this treatment well with no concerning adverse effects. She had repeat CT scan of the chest, abdomen and pelvis performed recently.  I personally and independently reviewed the scans and discussed the results with the patient and her husband. Her scan showed continuous improvement of her disease with no concerning findings for progression. I recommended for the patient to continue her current treatment and she will start with the first cycle of maintenance treatment with Alimta and Ketruda (pembrolizumab) today. I will see her back for follow-up visit in 3 weeks for evaluation before the next cycle of her treatment. She was advised to call immediately if she has any concerning symptoms in the interval. .The patient voices understanding of current disease status and treatment options and is in agreement with the current care plan.  All questions were answered. The patient knows to call the clinic with any problems, questions or concerns. We can certainly see the patient much sooner if necessary.  Disclaimer: This note was dictated with voice recognition software. Similar sounding  words can inadvertently be transcribed and may not be corrected upon review.

## 2018-07-04 NOTE — Patient Instructions (Signed)
Start Discharge Instructions for Patients Receiving Chemotherapy  Today you received the following chemotherapy agents: Pembrolizumab [name]), Pemetrexed (Alimta).  To help prevent nausea and vomiting after your treatment, we encourage you to take your nausea medication as directed. Received Aloxi during treatment today-->Take Compazine (not Zofran) for the next 3 days as needed.    If you develop nausea and vomiting that is not controlled by your nausea medication, call the clinic.   BELOW ARE SYMPTOMS THAT SHOULD BE REPORTED IMMEDIATELY:  *FEVER GREATER THAN 100.5 F  *CHILLS WITH OR WITHOUT FEVER  NAUSEA AND VOMITING THAT IS NOT CONTROLLED WITH YOUR NAUSEA MEDICATION  *UNUSUAL SHORTNESS OF BREATH  *UNUSUAL BRUISING OR BLEEDING  TENDERNESS IN MOUTH AND THROAT WITH OR WITHOUT PRESENCE OF ULCERS  *URINARY PROBLEMS  *BOWEL PROBLEMS  UNUSUAL RASH Items with * indicate a potential emergency and should be followed up as soon as possible.  Feel free to call the clinic should you have any questions or concerns. The clinic phone number is [phone].  Please show the [place] at check-in to the Emergency Department and triage nurse.

## 2018-07-04 NOTE — Progress Notes (Signed)
Brief nutrition follow-up completed with patient during infusion. Weight is relatively stable and has increased slightly to 118 pounds on October 23 from 117 pounds September 10. Patient reports her nausea improved when she discontinued the morphine. She is eating more often and continues nutrition shakes. She denies any nutrition impact symptoms at this time.  Nutrition diagnosis: Inadequate oral intake improved.  Intervention: Provided support and encouragement for patient to continue strategies for increased calories and protein. Encouraged continuation of oral nutrition supplements. Teach back method used.  Monitoring, evaluation, goals: Patient has increased calories and protein and has had weight stability.  Next visit: Patient will contact nutrition for any questions or concerns.  **Disclaimer: This note was dictated with voice recognition software. Similar sounding words can inadvertently be transcribed and this note may contain transcription errors which may not have been corrected upon publication of note.**

## 2018-07-05 ENCOUNTER — Telehealth: Payer: Self-pay | Admitting: Internal Medicine

## 2018-07-05 NOTE — Telephone Encounter (Signed)
Scheduled appt per 10/23 los - pt is aware of changes and appts.

## 2018-07-11 ENCOUNTER — Other Ambulatory Visit: Payer: 59

## 2018-07-18 ENCOUNTER — Other Ambulatory Visit: Payer: 59

## 2018-07-23 MED FILL — FOLIC ACID 1 MG TABS: 1 | 30 days supply | Qty: 30 | Fill #4

## 2018-07-23 MED FILL — OMEPRAZOLE 20 MG CPDR: 20 | 30 days supply | Qty: 30 | Fill #1

## 2018-07-24 ENCOUNTER — Telehealth: Payer: Self-pay | Admitting: Internal Medicine

## 2018-07-24 ENCOUNTER — Inpatient Hospital Stay: Payer: 59

## 2018-07-24 ENCOUNTER — Encounter: Payer: Self-pay | Admitting: Internal Medicine

## 2018-07-24 ENCOUNTER — Inpatient Hospital Stay (HOSPITAL_BASED_OUTPATIENT_CLINIC_OR_DEPARTMENT_OTHER): Payer: 59 | Admitting: Internal Medicine

## 2018-07-24 ENCOUNTER — Inpatient Hospital Stay: Payer: 59 | Attending: Internal Medicine

## 2018-07-24 VITALS — BP 115/68 | HR 104 | Temp 98.6°F | Resp 18 | Ht 67.0 in | Wt 120.4 lb

## 2018-07-24 VITALS — HR 95

## 2018-07-24 DIAGNOSIS — C7801 Secondary malignant neoplasm of right lung: Secondary | ICD-10-CM | POA: Diagnosis not present

## 2018-07-24 DIAGNOSIS — C3490 Malignant neoplasm of unspecified part of unspecified bronchus or lung: Secondary | ICD-10-CM

## 2018-07-24 DIAGNOSIS — C7802 Secondary malignant neoplasm of left lung: Secondary | ICD-10-CM | POA: Diagnosis not present

## 2018-07-24 DIAGNOSIS — Z5111 Encounter for antineoplastic chemotherapy: Secondary | ICD-10-CM | POA: Diagnosis not present

## 2018-07-24 DIAGNOSIS — Z79899 Other long term (current) drug therapy: Secondary | ICD-10-CM | POA: Diagnosis not present

## 2018-07-24 DIAGNOSIS — R000 Tachycardia, unspecified: Secondary | ICD-10-CM

## 2018-07-24 DIAGNOSIS — C7951 Secondary malignant neoplasm of bone: Secondary | ICD-10-CM | POA: Insufficient documentation

## 2018-07-24 DIAGNOSIS — Z5112 Encounter for antineoplastic immunotherapy: Secondary | ICD-10-CM | POA: Diagnosis not present

## 2018-07-24 DIAGNOSIS — Z7982 Long term (current) use of aspirin: Secondary | ICD-10-CM | POA: Diagnosis not present

## 2018-07-24 DIAGNOSIS — C801 Malignant (primary) neoplasm, unspecified: Secondary | ICD-10-CM | POA: Diagnosis not present

## 2018-07-24 DIAGNOSIS — C7989 Secondary malignant neoplasm of other specified sites: Secondary | ICD-10-CM | POA: Diagnosis not present

## 2018-07-24 DIAGNOSIS — C3411 Malignant neoplasm of upper lobe, right bronchus or lung: Secondary | ICD-10-CM

## 2018-07-24 DIAGNOSIS — C799 Secondary malignant neoplasm of unspecified site: Secondary | ICD-10-CM

## 2018-07-24 LAB — CMP (CANCER CENTER ONLY)
ALBUMIN: 2.8 g/dL — AB (ref 3.5–5.0)
ALT: 24 U/L (ref 0–44)
ANION GAP: 10 (ref 5–15)
AST: 27 U/L (ref 15–41)
Alkaline Phosphatase: 84 U/L (ref 38–126)
BUN: 11 mg/dL (ref 6–20)
CHLORIDE: 104 mmol/L (ref 98–111)
CO2: 25 mmol/L (ref 22–32)
Calcium: 9.2 mg/dL (ref 8.9–10.3)
Creatinine: 0.77 mg/dL (ref 0.44–1.00)
GFR, Est AFR Am: >60 mL/min (ref >60)
GFR, Estimated: >60 mL/min (ref >60)
GLUCOSE: 123 mg/dL — AB (ref 70–99)
POTASSIUM: 4 mmol/L (ref 3.5–5.1)
SODIUM: 139 mmol/L (ref 135–145)
TOTAL PROTEIN: 6.8 g/dL (ref 6.5–8.1)

## 2018-07-24 LAB — CBC WITH DIFFERENTIAL (CANCER CENTER ONLY)
ABS IMMATURE GRANULOCYTES: 0.05 10*3/uL (ref 0.00–0.07)
BASOS PCT: 1 %
Basophils Absolute: 0 10*3/uL (ref 0.0–0.1)
Eosinophils Absolute: 0.1 10*3/uL (ref 0.0–0.5)
Eosinophils Relative: 2 %
HCT: 31.5 % — ABNORMAL LOW (ref 36.0–46.0)
HEMOGLOBIN: 10 g/dL — AB (ref 12.0–15.0)
IMMATURE GRANULOCYTES: 1 %
LYMPHS PCT: 7 %
Lymphs Abs: 0.3 10*3/uL — ABNORMAL LOW (ref 0.7–4.0)
MCH: 30.7 pg (ref 26.0–34.0)
MCHC: 31.7 g/dL (ref 30.0–36.0)
MCV: 96.6 fL (ref 80.0–100.0)
Monocytes Absolute: 0.6 10*3/uL (ref 0.1–1.0)
Monocytes Relative: 13 %
NEUTROS ABS: 3.5 10*3/uL (ref 1.7–7.7)
NEUTROS PCT: 76 %
PLATELETS: 385 10*3/uL (ref 150–400)
RBC: 3.26 MIL/uL — AB (ref 3.87–5.11)
RDW: 14.6 % (ref 11.5–15.5)
WBC Count: 4.6 10*3/uL (ref 4.0–10.5)
nRBC: 0 % (ref 0.0–0.2)

## 2018-07-24 LAB — TSH: TSH: 1.675 u[IU]/mL (ref 0.308–3.960)

## 2018-07-24 MED ORDER — HEPARIN SOD (PORK) LOCK FLUSH 100 UNIT/ML IV SOLN
500.0000 [IU] | Freq: Once | INTRAVENOUS | Status: AC | PRN
  Administered 2018-07-24: 500 [IU]
  Filled 2018-07-24: qty 5

## 2018-07-24 MED ORDER — DEXAMETHASONE SODIUM PHOSPHATE 10 MG/ML IJ SOLN
10.0000 mg | Freq: Once | INTRAMUSCULAR | Status: AC
  Administered 2018-07-24: 10 mg via INTRAVENOUS

## 2018-07-24 MED ORDER — SODIUM CHLORIDE 0.9 % IV SOLN
200.0000 mg | Freq: Once | INTRAVENOUS | Status: AC
  Administered 2018-07-24: 200 mg via INTRAVENOUS
  Filled 2018-07-24: qty 8

## 2018-07-24 MED ORDER — SODIUM CHLORIDE 0.9 % IV SOLN
Freq: Once | INTRAVENOUS | Status: AC
  Administered 2018-07-24: 11:00:00 via INTRAVENOUS
  Filled 2018-07-24: qty 250

## 2018-07-24 MED ORDER — SODIUM CHLORIDE 0.9% FLUSH
10.0000 mL | INTRAVENOUS | Status: DC | PRN
  Administered 2018-07-24: 10 mL
  Filled 2018-07-24: qty 10

## 2018-07-24 MED ORDER — DEXAMETHASONE SODIUM PHOSPHATE 10 MG/ML IJ SOLN
INTRAMUSCULAR | Status: AC
  Filled 2018-07-24: qty 1

## 2018-07-24 MED ORDER — ONDANSETRON HCL 4 MG/2ML IJ SOLN
8.0000 mg | Freq: Once | INTRAMUSCULAR | Status: AC
  Administered 2018-07-24: 8 mg via INTRAVENOUS

## 2018-07-24 MED ORDER — ONDANSETRON HCL 4 MG/2ML IJ SOLN
INTRAMUSCULAR | Status: AC
  Filled 2018-07-24: qty 4

## 2018-07-24 MED ORDER — SODIUM CHLORIDE 0.9 % IV SOLN
495.0000 mg/m2 | Freq: Once | INTRAVENOUS | Status: AC
  Administered 2018-07-24: 800 mg via INTRAVENOUS
  Filled 2018-07-24: qty 12

## 2018-07-24 NOTE — Patient Instructions (Signed)
[facility] Discharge Instructions for Patients Receiving Chemotherapy  Today you received the following chemotherapy agents: pembrolizumab [name]), pemetrexed (Alimta).  To help prevent nausea and vomiting after your treatment, we encourage you to take your nausea medication as directed. Received Aloxi during treatment today-->Take Compazine (not Zofran) for the next 3 days as needed.    If you develop nausea and vomiting that is not controlled by your nausea medication, call the clinic.   BELOW ARE SYMPTOMS THAT SHOULD BE REPORTED IMMEDIATELY:  *FEVER GREATER THAN 100.5 F  *CHILLS WITH OR WITHOUT FEVER  NAUSEA AND VOMITING THAT IS NOT CONTROLLED WITH YOUR NAUSEA MEDICATION  *UNUSUAL SHORTNESS OF BREATH  *UNUSUAL BRUISING OR BLEEDING  TENDERNESS IN MOUTH AND THROAT WITH OR WITHOUT PRESENCE OF ULCERS  *URINARY PROBLEMS  *BOWEL PROBLEMS  UNUSUAL RASH Items with * indicate a potential emergency and should be followed up as soon as possible.  Feel free to call the clinic should you have any questions or concerns. The clinic phone number is [phone].  Please show the [place] at check-in to the Emergency Department and triage nurse.

## 2018-07-24 NOTE — Telephone Encounter (Signed)
3 cycles already scheduled per 11/12 los - no additional appts added.

## 2018-07-24 NOTE — Progress Notes (Signed)
[place] [phone]   [phone]) [phone]  OFFICE PROGRESS NOTE  Donald Prose, MD 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  Metastatic sarcoma cannot be also excluded at this point. Molecular studies by [facility] 1 showed no actionable mutations. PDL 1 expression 99%  PRIOR THERAPY:Palliative radiotherapy to the neck mass and hip lesion under the care of Dr. Tammi Klippel   CURRENT THERAPY: Systemic chemotherapy with carboplatin for AUC of 5, Alimta 500 mg/M2 and Keytruda 200 mg IV every 3 weeks.  First dose expected April 10, 2018.  Status post 5 cycles.  Starting from cycle #5 the patient is on maintenance treatment with Alimta and Keytruda.  INTERVAL HISTORY: Rebecca Lucas 62 y.o. female returns to the clinic today for follow-up visit.  The patient is feeling fine today with no concerning complaints.  She has increased stamina and she is back to exercising at regular basis.  She denied having any chest pain, shortness breath, cough or hemoptysis.  She has no nausea, vomiting, diarrhea or constipation.  She denied having any recent weight loss or night sweats.  She continues to tolerate her treatment with maintenance Alimta and Keytruda fairly well.  She is here today for evaluation before starting cycle #6.  MEDICAL HISTORY:No past medical history on file.  ALLERGIES:  has No Known Allergies.  MEDICATIONS:  Current Outpatient Medications  Medication Sig Dispense Refill  . acetaminophen (TYLENOL) 500 MG tablet Take 1,000 mg by mouth every 6 (six) hours as needed for mild pain.     Marland Kitchen alendronate (FOSAMAX) 70 MG tablet Take 70 mg by mouth once a week. Wednesday  3  . butalbital-aspirin-caffeine (FIORINAL) 50-325-40  MG capsule Take 1 capsule by mouth every 6 (six) hours as needed for migraine.   1  . Calcium Carbonate-Vitamin D (CALCIUM-D PO) Take 1 tablet by mouth 2 (two) times daily.    . fluconazole (DIFLUCAN) 200 MG tablet Take 1 tablet (200 mg total) by mouth daily. (Patient not taking: Reported on 07/04/2018) 5 tablet 0  . folic acid (FOLVITE) 1 MG tablet Take 1 tablet (1 mg total) by mouth daily. 30 tablet 4  . ibuprofen (ADVIL,MOTRIN) 200 MG tablet Take 600 mg by mouth every 6 (six) hours as needed for mild pain.     Marland Kitchen lidocaine-prilocaine (EMLA) cream Apply 1 application topically as needed. 30 g 0  . linaclotide (LINZESS) 72 MCG capsule Take 1 capsule (72 mcg total) by mouth daily before breakfast. 30 capsule 2  . lubiprostone (AMITIZA) 24 MCG capsule Take 1 capsule (24 mcg total) by mouth 2 (two) times daily with a meal. (Patient not taking: Reported on 07/04/2018) 60 capsule 2  . LUTEIN PO Take 1 tablet by mouth daily.    . magic mouthwash SOLN Take 5 mLs by mouth 4 (four) times daily as needed for mouth pain. (Patient not taking: Reported on 07/04/2018) 240 mL 2  . omeprazole (PRILOSEC) 20 MG capsule Take 1 capsule (20 mg total) by mouth daily. 30 capsule 2  . ondansetron (ZOFRAN) 8 MG tablet Take 1 tablet (8 mg total) by mouth every 8 (eight) hours as needed for nausea or vomiting. (Patient not taking: Reported on 07/04/2018) 20 tablet 0  . prochlorperazine (COMPAZINE) 10 MG tablet Take 1  tablet (10 mg total) by mouth every 6 (six) hours as needed for nausea or vomiting. (Patient not taking: Reported on 07/04/2018) 30 tablet 0  . sucralfate (CARAFATE) 1 GM/10ML suspension Take 10 mLs (1 g total) by mouth 4 (four) times daily -  with meals and at bedtime. (Patient not taking: Reported on 07/04/2018) 420 mL 0  . traMADol (ULTRAM) 50 MG tablet Take 1 tablet (50 mg total) by mouth every 12 (twelve) hours as needed for moderate pain. 60 tablet 0  . zolpidem (AMBIEN) 10 MG tablet Take 1 tablet (10 mg  total) by mouth at bedtime as needed for sleep. 30 tablet 0   No current facility-administered medications for this visit.     SURGICAL HISTORY:  Past Surgical History:  Procedure Laterality Date  . IR IMAGING GUIDED PORT INSERTION  06/26/2018    REVIEW OF SYSTEMS:  A comprehensive review of systems was negative.   PHYSICAL EXAMINATION: General appearance: alert, cooperative and no distress Head: Normocephalic, without obvious abnormality, atraumatic Neck: no adenopathy, no JVD, supple, symmetrical, trachea midline and thyroid not enlarged, symmetric, no tenderness/mass/nodules Lymph nodes: Cervical, supraclavicular, and axillary nodes normal. Resp: clear to auscultation bilaterally Back: symmetric, no curvature. ROM normal. No CVA tenderness. Cardio: regular rate and rhythm, S1, S2 normal, no murmur, click, rub or gallop GI: soft, non-tender; bowel sounds normal; no masses,  no organomegaly Extremities: extremities normal, atraumatic, no cyanosis or edema  ECOG PERFORMANCE STATUS: 1 - Symptomatic but completely ambulatory  Blood pressure 115/68, pulse (!) 104, temperature 98.6 F (37 C), temperature source Oral, resp. rate 18, height 5\' 7"  (1.702 m), weight 120 lb 6.4 oz (54.6 kg), SpO2 100 %.  LABORATORY DATA: Lab Results  Component Value Date   WBC 4.6 07/24/2018   HGB 10.0 (L) 07/24/2018   HCT 31.5 (L) 07/24/2018   MCV 96.6 07/24/2018   PLT 385 07/24/2018      Chemistry      Component Value Date/Time   NA 140 07/04/2018 1048   K 4.0 07/04/2018 1048   CL 106 07/04/2018 1048   CO2 23 07/04/2018 1048   BUN 17 07/04/2018 1048   CREATININE 0.71 07/04/2018 1048      Component Value Date/Time   CALCIUM 9.4 07/04/2018 1048   ALKPHOS 64 07/04/2018 1048   AST 35 07/04/2018 1048   ALT 43 07/04/2018 1048   BILITOT <0.2 (L) 07/04/2018 1048       RADIOGRAPHIC STUDIES: Ct Chest W Contrast  Result Date: 06/28/2018 CLINICAL DATA:  Patient with history of metastatic  non-small cell carcinoma. Follow-up exam. EXAM: CT CHEST, ABDOMEN, AND PELVIS WITH CONTRAST TECHNIQUE: Multidetector CT imaging of the chest, abdomen and pelvis was performed following the standard protocol during bolus administration of intravenous contrast. CONTRAST:  113mL OMNIPAQUE IOHEXOL 300 MG/ML  SOLN COMPARISON:  CT CAP 05/10/2018 FINDINGS: CT CHEST FINDINGS Cardiovascular: Right anterior chest wall Port-A-Cath with tip terminating in the superior vena cava. Normal heart size. Trace fluid superior pericardial recess. Thoracic aortic vascular calcifications. Mediastinum/Nodes: Unchanged 8 mm precarinal node (image 25; series 2). No enlarged axillary, mediastinal or hilar lymphadenopathy. Normal appearance of the esophagus. Lungs/Pleura: Central airways are patent. Dependent atelectasis in the bilateral lower lobes. Right upper lobe mass mildly decreased in size when compared to prior measuring 4.5 x 3.4 x 3.6 cm cm (image 49; series 4), previously 4.8 x 3.6 x 3.9 cm. No new or enlarging pulmonary nodules or masses. No large pleural effusion or pneumothorax. Musculoskeletal: Interval healing  fracture of the lateral left seventh rib (image 23; series 6). Persistent displaced left fourth rib fracture (image 23; series 6). CT ABDOMEN PELVIS FINDINGS Hepatobiliary: Liver is normal in size and contour. No focal hepatic lesions identified. Gallbladder is unremarkable. No intrahepatic or extrahepatic biliary ductal dilatation. Pancreas: Unremarkable Spleen: Unremarkable Adrenals/Urinary Tract: Decreased left adrenal gland metastasis measuring 1.5 x 1.0 cm (image 58; series 2), previously 2.1 x 1.1 cm. Kidneys enhance symmetrically with contrast. No hydronephrosis. Urinary bladder is unremarkable. Stomach/Bowel: Oral contrast material to the level of the rectum. No abnormal bowel wall thickening or evidence for bowel obstruction. No free fluid or free intraperitoneal air. Normal morphology of the stomach.  Vascular/Lymphatic: Normal caliber abdominal aorta. Peripheral calcified atherosclerotic plaque. No retroperitoneal lymphadenopathy. Reproductive: Uterus and adnexal structures unremarkable. Other: None. Musculoskeletal: Right hemi sacral lesion measures approximately 2.9 x 2.7 cm (image 90; series 2), similar size when compared to prior however there is mild interval central sclerosis of the lesion. Similar-appearing 2.9 x 2.0 cm left posterior iliac lesion (image 91; series 2). Similar-appearing 1.3 cm lytic right iliac lesion (image 90; series 2). Similar-appearing 8 mm sclerotic lesion posterior left acetabulum (image 108; series 2). Similar-appearing 2.2 cm right ischial lesion (image 119; series 2). Similar-appearing compression deformity of the L2 vertebral body. Similar to mild interval increase in size and sclerosis of mixed lucent and sclerotic lesion within the anterior inferior T10 vertebral body (image 83; series 6) measuring 1.2 cm. Interval decrease in size of nodule posterior to the left psoas measuring 1.2 x 0.9 cm (image 85; series 2), previously 2.2 x 0.9 cm. IMPRESSION: 1. Minimal interval decrease in size of right upper lobe mass. 2. Similar-appearing osseous metastatic disease. Lesion within the right hemi sacrum appears to have mild interval central sclerosis. Lesion within the anterior inferior T10 vertebral body minimally changed to mildly more sclerotic when compared to prior exam. 3. Interval decrease in size of lesion posterior to the left psoas. 4. Slight interval decrease in size of left adrenal metastasis. Electronically Signed   By: Lovey Newcomer M.D.   On: 06/28/2018 13:47   Ct Abdomen Pelvis W Contrast  Result Date: 06/28/2018 CLINICAL DATA:  Patient with history of metastatic non-small cell carcinoma. Follow-up exam. EXAM: CT CHEST, ABDOMEN, AND PELVIS WITH CONTRAST TECHNIQUE: Multidetector CT imaging of the chest, abdomen and pelvis was performed following the standard protocol  during bolus administration of intravenous contrast. CONTRAST:  154mL OMNIPAQUE IOHEXOL 300 MG/ML  SOLN COMPARISON:  CT CAP 05/10/2018 FINDINGS: CT CHEST FINDINGS Cardiovascular: Right anterior chest wall Port-A-Cath with tip terminating in the superior vena cava. Normal heart size. Trace fluid superior pericardial recess. Thoracic aortic vascular calcifications. Mediastinum/Nodes: Unchanged 8 mm precarinal node (image 25; series 2). No enlarged axillary, mediastinal or hilar lymphadenopathy. Normal appearance of the esophagus. Lungs/Pleura: Central airways are patent. Dependent atelectasis in the bilateral lower lobes. Right upper lobe mass mildly decreased in size when compared to prior measuring 4.5 x 3.4 x 3.6 cm cm (image 49; series 4), previously 4.8 x 3.6 x 3.9 cm. No new or enlarging pulmonary nodules or masses. No large pleural effusion or pneumothorax. Musculoskeletal: Interval healing fracture of the lateral left seventh rib (image 23; series 6). Persistent displaced left fourth rib fracture (image 23; series 6). CT ABDOMEN PELVIS FINDINGS Hepatobiliary: Liver is normal in size and contour. No focal hepatic lesions identified. Gallbladder is unremarkable. No intrahepatic or extrahepatic biliary ductal dilatation. Pancreas: Unremarkable Spleen: Unremarkable Adrenals/Urinary Tract: Decreased left adrenal gland  metastasis measuring 1.5 x 1.0 cm (image 58; series 2), previously 2.1 x 1.1 cm. Kidneys enhance symmetrically with contrast. No hydronephrosis. Urinary bladder is unremarkable. Stomach/Bowel: Oral contrast material to the level of the rectum. No abnormal bowel wall thickening or evidence for bowel obstruction. No free fluid or free intraperitoneal air. Normal morphology of the stomach. Vascular/Lymphatic: Normal caliber abdominal aorta. Peripheral calcified atherosclerotic plaque. No retroperitoneal lymphadenopathy. Reproductive: Uterus and adnexal structures unremarkable. Other: None.  Musculoskeletal: Right hemi sacral lesion measures approximately 2.9 x 2.7 cm (image 90; series 2), similar size when compared to prior however there is mild interval central sclerosis of the lesion. Similar-appearing 2.9 x 2.0 cm left posterior iliac lesion (image 91; series 2). Similar-appearing 1.3 cm lytic right iliac lesion (image 90; series 2). Similar-appearing 8 mm sclerotic lesion posterior left acetabulum (image 108; series 2). Similar-appearing 2.2 cm right ischial lesion (image 119; series 2). Similar-appearing compression deformity of the L2 vertebral body. Similar to mild interval increase in size and sclerosis of mixed lucent and sclerotic lesion within the anterior inferior T10 vertebral body (image 83; series 6) measuring 1.2 cm. Interval decrease in size of nodule posterior to the left psoas measuring 1.2 x 0.9 cm (image 85; series 2), previously 2.2 x 0.9 cm. IMPRESSION: 1. Minimal interval decrease in size of right upper lobe mass. 2. Similar-appearing osseous metastatic disease. Lesion within the right hemi sacrum appears to have mild interval central sclerosis. Lesion within the anterior inferior T10 vertebral body minimally changed to mildly more sclerotic when compared to prior exam. 3. Interval decrease in size of lesion posterior to the left psoas. 4. Slight interval decrease in size of left adrenal metastasis. Electronically Signed   By: Lovey Newcomer M.D.   On: 06/28/2018 13:47   Ir Imaging Guided Port Insertion  Result Date: 06/26/2018 INDICATION: 62 year old female with a history of non-small cell carcinoma EXAM: IMPLANTED PORT A CATH PLACEMENT WITH ULTRASOUND AND FLUOROSCOPIC GUIDANCE MEDICATIONS: 2 g Ancef; The antibiotic was administered within an appropriate time interval prior to skin puncture. ANESTHESIA/SEDATION: Moderate (conscious) sedation was employed during this procedure. A total of Versed 2.0 mg and Fentanyl 100 mcg was administered intravenously. Moderate Sedation  Time: 24 minutes. The patient's level of consciousness and vital signs were monitored continuously by radiology nursing throughout the procedure under my direct supervision. FLUOROSCOPY TIME:  0 minutes, 12 seconds (of 0.6 mGy) COMPLICATIONS: None immediate. PROCEDURE: The procedure, risks, benefits, and alternatives were explained to the patient. Questions regarding the procedure were encouraged and answered. The patient understands and consents to the procedure. Ultrasound survey was performed with images stored and sent to PACs. The right neck and chest was prepped with chlorhexidine, and draped in the usual sterile fashion using maximum barrier technique (cap and mask, sterile gown, sterile gloves, large sterile sheet, hand hygiene and cutaneous antiseptic). Antibiotic prophylaxis was provided with 2.0g Ancef administered IV one hour prior to skin incision. Local anesthesia was attained by infiltration with 1% lidocaine without epinephrine. Ultrasound demonstrated patency of the right internal jugular vein, and this was documented with an image. Under real-time ultrasound guidance, this vein was accessed with a 21 gauge micropuncture needle and image documentation was performed. A small dermatotomy was made at the access site with an 11 scalpel. A 0.018" wire was advanced into the SVC and used to estimate the length of the internal catheter. The access needle exchanged for a 51F micropuncture vascular sheath. The 0.018" wire was then removed and a 0.035" wire  advanced into the IVC. An appropriate location for the subcutaneous reservoir was selected below the clavicle and an incision was made through the skin and underlying soft tissues. The subcutaneous tissues were then dissected using a combination of blunt and sharp surgical technique and a pocket was formed. A single lumen power injectable portacatheter was then tunneled through the subcutaneous tissues from the pocket to the dermatotomy and the port  reservoir placed within the subcutaneous pocket. The venous access site was then serially dilated and a peel away vascular sheath placed over the wire. The wire was removed and the port catheter advanced into position under fluoroscopic guidance. The catheter tip is positioned in the cavoatrial junction. This was documented with a spot image. The portacatheter was then tested and found to flush and aspirate well. The port was flushed with saline followed by 100 units/mL heparinized saline. The pocket was then closed in two layers using first subdermal inverted interrupted absorbable sutures followed by a running subcuticular suture. The epidermis was then sealed with Dermabond. The dermatotomy at the venous access site was also seal with Dermabond. Patient tolerated the procedure well and remained hemodynamically stable throughout. No complications encountered and no significant blood loss encountered . IMPRESSION: Status post right IJ port catheter placement. Catheter ready for use. Signed, Dulcy Fanny. Dellia Nims, RPVI [facility] Radiology Electronically Signed   By: Corrie Mckusick D.O.   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 PDL 1 expression was 99%. The molecular studies by foundation 1 as well as [employer] showed no actionable mutations. The patient was a started on treatment with systemic chemotherapy with carboplatin for AUC of 5, Alimta 500 mg/M2 and Keytruda 200 mg IV every 3 weeks.  She is status post 4 cycles.  She is also status post 1 cycle of maintenance treatment with Alimta and Keytruda.  She  tolerated the last cycle of her treatment fairly well. I recommended for the patient to proceed with cycle #6 today as scheduled. I will see her back for follow-up visit in 3 weeks for evaluation after repeating CT scan of the neck, chest, abdomen and pelvis for restaging of her disease. For the tachycardia, I recommended for the patient to increase her oral intake of liquid. The patient was advised to call immediately if she has any concerning symptoms in the interval. .The patient voices understanding of current disease status and treatment options and is in agreement with the current care plan.  All questions were answered. The patient knows to call the clinic with any problems, questions or concerns. We can certainly see the patient much sooner if necessary.  Disclaimer: This note was dictated with voice recognition software. Similar sounding words can inadvertently be transcribed and may not be corrected upon review.

## 2018-08-02 ENCOUNTER — Ambulatory Visit (HOSPITAL_COMMUNITY)
Admission: RE | Admit: 2018-08-02 | Discharge: 2018-08-02 | Disposition: A | Discharge status: Home / Self Care | Payer: 59 | Source: Ambulatory Visit | Attending: Internal Medicine | Admitting: Internal Medicine

## 2018-08-02 DIAGNOSIS — C3490 Malignant neoplasm of unspecified part of unspecified bronchus or lung: Secondary | ICD-10-CM | POA: Diagnosis not present

## 2018-08-02 DIAGNOSIS — C7972 Secondary malignant neoplasm of left adrenal gland: Secondary | ICD-10-CM | POA: Diagnosis not present

## 2018-08-02 DIAGNOSIS — C7951 Secondary malignant neoplasm of bone: Secondary | ICD-10-CM | POA: Insufficient documentation

## 2018-08-02 DIAGNOSIS — Z5111 Encounter for antineoplastic chemotherapy: Secondary | ICD-10-CM | POA: Diagnosis not present

## 2018-08-02 DIAGNOSIS — I700 Atherosclerosis of aorta: Secondary | ICD-10-CM | POA: Insufficient documentation

## 2018-08-02 MED ORDER — SODIUM CHLORIDE (PF) 0.9 % IJ SOLN
INTRAMUSCULAR | Status: AC
  Filled 2018-08-02: qty 50

## 2018-08-02 MED ORDER — HEPARIN SOD (PORK) LOCK FLUSH 100 UNIT/ML IV SOLN
500.0000 [IU] | Freq: Once | INTRAVENOUS | Status: AC
  Administered 2018-08-02: 500 [IU] via INTRAVENOUS

## 2018-08-02 MED ORDER — HEPARIN SOD (PORK) LOCK FLUSH 100 UNIT/ML IV SOLN
INTRAVENOUS | Status: AC
  Filled 2018-08-02: qty 5

## 2018-08-02 MED ORDER — IOHEXOL 300 MG/ML  SOLN
100.0000 mL | Freq: Once | INTRAMUSCULAR | Status: AC | PRN
  Administered 2018-08-02: 100 mL via INTRAVENOUS

## 2018-08-14 DIAGNOSIS — Z23 Encounter for immunization: Secondary | ICD-10-CM | POA: Diagnosis not present

## 2018-08-16 ENCOUNTER — Inpatient Hospital Stay: Payer: 59

## 2018-08-16 ENCOUNTER — Inpatient Hospital Stay: Payer: 59 | Attending: Internal Medicine

## 2018-08-16 ENCOUNTER — Inpatient Hospital Stay (HOSPITAL_BASED_OUTPATIENT_CLINIC_OR_DEPARTMENT_OTHER): Payer: 59 | Admitting: Internal Medicine

## 2018-08-16 ENCOUNTER — Encounter: Payer: Self-pay | Admitting: Internal Medicine

## 2018-08-16 VITALS — BP 102/64 | HR 104 | Temp 98.4°F | Resp 16 | Ht 67.0 in | Wt 119.8 lb

## 2018-08-16 VITALS — HR 92

## 2018-08-16 DIAGNOSIS — C3411 Malignant neoplasm of upper lobe, right bronchus or lung: Secondary | ICD-10-CM | POA: Insufficient documentation

## 2018-08-16 DIAGNOSIS — C7972 Secondary malignant neoplasm of left adrenal gland: Secondary | ICD-10-CM | POA: Insufficient documentation

## 2018-08-16 DIAGNOSIS — C7951 Secondary malignant neoplasm of bone: Secondary | ICD-10-CM | POA: Insufficient documentation

## 2018-08-16 DIAGNOSIS — C799 Secondary malignant neoplasm of unspecified site: Secondary | ICD-10-CM

## 2018-08-16 DIAGNOSIS — Z5112 Encounter for antineoplastic immunotherapy: Secondary | ICD-10-CM | POA: Insufficient documentation

## 2018-08-16 DIAGNOSIS — Z79899 Other long term (current) drug therapy: Secondary | ICD-10-CM | POA: Insufficient documentation

## 2018-08-16 DIAGNOSIS — I700 Atherosclerosis of aorta: Secondary | ICD-10-CM

## 2018-08-16 DIAGNOSIS — C7989 Secondary malignant neoplasm of other specified sites: Secondary | ICD-10-CM

## 2018-08-16 DIAGNOSIS — Z5111 Encounter for antineoplastic chemotherapy: Secondary | ICD-10-CM | POA: Insufficient documentation

## 2018-08-16 DIAGNOSIS — R5383 Other fatigue: Secondary | ICD-10-CM

## 2018-08-16 DIAGNOSIS — G893 Neoplasm related pain (acute) (chronic): Secondary | ICD-10-CM

## 2018-08-16 DIAGNOSIS — Z5189 Encounter for other specified aftercare: Secondary | ICD-10-CM | POA: Insufficient documentation

## 2018-08-16 LAB — CBC WITH DIFFERENTIAL (CANCER CENTER ONLY)
Abs Immature Granulocytes: 0.05 10*3/uL (ref 0.00–0.07)
Basophils Absolute: 0 10*3/uL (ref 0.0–0.1)
Basophils Relative: 1 %
EOS ABS: 0.2 10*3/uL (ref 0.0–0.5)
Eosinophils Relative: 2 %
HCT: 33 % — ABNORMAL LOW (ref 36.0–46.0)
Hemoglobin: 10.4 g/dL — ABNORMAL LOW (ref 12.0–15.0)
Immature Granulocytes: 1 %
Lymphocytes Relative: 7 %
Lymphs Abs: 0.5 10*3/uL — ABNORMAL LOW (ref 0.7–4.0)
MCH: 29.9 pg (ref 26.0–34.0)
MCHC: 31.5 g/dL (ref 30.0–36.0)
MCV: 94.8 fL (ref 80.0–100.0)
Monocytes Absolute: 0.8 10*3/uL (ref 0.1–1.0)
Monocytes Relative: 10 %
NEUTROS PCT: 79 %
Neutro Abs: 6.1 10*3/uL (ref 1.7–7.7)
PLATELETS: 354 10*3/uL (ref 150–400)
RBC: 3.48 MIL/uL — AB (ref 3.87–5.11)
RDW: 14.6 % (ref 11.5–15.5)
WBC: 7.7 10*3/uL (ref 4.0–10.5)
nRBC: 0 % (ref 0.0–0.2)

## 2018-08-16 LAB — CMP (CANCER CENTER ONLY)
ALBUMIN: 2.7 g/dL — AB (ref 3.5–5.0)
ALT: 49 U/L — ABNORMAL HIGH (ref 0–44)
ANION GAP: 9 (ref 5–15)
AST: 44 U/L — ABNORMAL HIGH (ref 15–41)
Alkaline Phosphatase: 98 U/L (ref 38–126)
BUN: 12 mg/dL (ref 6–20)
CALCIUM: 9.3 mg/dL (ref 8.9–10.3)
CO2: 26 mmol/L (ref 22–32)
Chloride: 104 mmol/L (ref 98–111)
Creatinine: 0.76 mg/dL (ref 0.44–1.00)
GFR, Est AFR Am: >60 mL/min (ref >60)
Glucose, Bld: 108 mg/dL — ABNORMAL HIGH (ref 70–99)
POTASSIUM: 4.4 mmol/L (ref 3.5–5.1)
SODIUM: 139 mmol/L (ref 135–145)
Total Protein: 6.9 g/dL (ref 6.5–8.1)

## 2018-08-16 LAB — TSH: TSH: 1.601 u[IU]/mL (ref 0.308–3.960)

## 2018-08-16 MED ORDER — FOLIC ACID 1 MG PO TABS: 1.0000 mg | ORAL_TABLET | Freq: Every day | ORAL | 4 refills | Status: DC

## 2018-08-16 MED ORDER — ONDANSETRON HCL 4 MG/2ML IJ SOLN
INTRAMUSCULAR | Status: AC
  Filled 2018-08-16: qty 4

## 2018-08-16 MED ORDER — SODIUM CHLORIDE 0.9 % IV SOLN
200.0000 mg | Freq: Once | INTRAVENOUS | Status: AC
  Administered 2018-08-16: 200 mg via INTRAVENOUS
  Filled 2018-08-16: qty 8

## 2018-08-16 MED ORDER — SODIUM CHLORIDE 0.9% FLUSH
10.0000 mL | INTRAVENOUS | Status: DC | PRN
  Administered 2018-08-16: 10 mL
  Filled 2018-08-16: qty 10

## 2018-08-16 MED ORDER — HEPARIN SOD (PORK) LOCK FLUSH 100 UNIT/ML IV SOLN
500.0000 [IU] | Freq: Once | INTRAVENOUS | Status: AC | PRN
  Administered 2018-08-16: 500 [IU]
  Filled 2018-08-16: qty 5

## 2018-08-16 MED ORDER — ALTEPLASE 2 MG IJ SOLR
INTRAMUSCULAR | Status: AC
  Filled 2018-08-16: qty 2

## 2018-08-16 MED ORDER — TRAMADOL HCL 50 MG PO TABS: 50.0000 mg | ORAL_TABLET | Freq: Two times a day (BID) | ORAL | 0 refills | Status: DC | PRN

## 2018-08-16 MED ORDER — DEXAMETHASONE SODIUM PHOSPHATE 10 MG/ML IJ SOLN
INTRAMUSCULAR | Status: AC
  Filled 2018-08-16: qty 1

## 2018-08-16 MED ORDER — CYANOCOBALAMIN 1000 MCG/ML IJ SOLN
1000.0000 ug | Freq: Once | INTRAMUSCULAR | Status: AC
  Administered 2018-08-16: 1000 ug via INTRAMUSCULAR

## 2018-08-16 MED ORDER — ONDANSETRON HCL 4 MG/2ML IJ SOLN
8.0000 mg | Freq: Once | INTRAMUSCULAR | Status: AC
  Administered 2018-08-16: 8 mg via INTRAVENOUS

## 2018-08-16 MED ORDER — SODIUM CHLORIDE 0.9 % IV SOLN
Freq: Once | INTRAVENOUS | Status: AC
  Administered 2018-08-16: 14:00:00 via INTRAVENOUS
  Filled 2018-08-16: qty 250

## 2018-08-16 MED ORDER — CYANOCOBALAMIN 1000 MCG/ML IJ SOLN
INTRAMUSCULAR | Status: AC
  Filled 2018-08-16: qty 1

## 2018-08-16 MED ORDER — ALTEPLASE 2 MG IJ SOLR
2.0000 mg | Freq: Once | INTRAMUSCULAR | Status: AC | PRN
  Administered 2018-08-16: 2 mg
  Filled 2018-08-16: qty 2

## 2018-08-16 MED ORDER — SODIUM CHLORIDE 0.9 % IV SOLN
487.0000 mg/m2 | Freq: Once | INTRAVENOUS | Status: AC
  Administered 2018-08-16: 800 mg via INTRAVENOUS
  Filled 2018-08-16: qty 20

## 2018-08-16 MED ORDER — DEXAMETHASONE SODIUM PHOSPHATE 10 MG/ML IJ SOLN
10.0000 mg | Freq: Once | INTRAMUSCULAR | Status: AC
  Administered 2018-08-16: 10 mg via INTRAVENOUS

## 2018-08-16 MED FILL — FOLIC ACID 1 MG TABS: 1 | 30 days supply | Qty: 30 | Fill #0

## 2018-08-16 MED FILL — OMEPRAZOLE 20 MG CPDR: 20 | 30 days supply | Qty: 30 | Fill #2

## 2018-08-16 MED FILL — traMADol HCL 50 MG TABS: 50 | 30 days supply | Qty: 60 | Fill #0

## 2018-08-16 NOTE — Progress Notes (Signed)
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  Metastatic sarcoma cannot be also excluded at this point. Molecular studies by foundation 1 showed no actionable mutations. PDL 1 expression 99%  PRIOR THERAPY:Palliative radiotherapy to the neck mass and hip lesion under the care of Dr. Tammi Klippel   CURRENT THERAPY: Systemic chemotherapy with carboplatin for AUC of 5, Alimta 500 mg/M2 and Keytruda 200 mg IV every 3 weeks.  First dose expected April 10, 2018.  Status post 6 cycles.  Starting from cycle #5 the patient is on maintenance treatment with Alimta and Keytruda.  INTERVAL HISTORY: Rebecca Lucas 62 y.o. female returns to the clinic today for follow-up visit accompanied by her husband.  The patient is feeling fine today with no concerning complaints.  She continues to tolerate her treatment with Alimta and Keytruda fairly well except for mild fatigue.  She denied having any current chest pain, shortness of breath but continues to have mild cough.  She has no hemoptysis.  She denied having any nausea, vomiting, diarrhea or constipation.  She has no headache or visual changes.  She had repeat CT scan of the neck, chest, abdomen pelvis performed recently and she is here for evaluation and discussion of her scan results and treatment options.   MEDICAL HISTORY:No past medical history on file.  ALLERGIES:  has No Known Allergies.  MEDICATIONS:  Current Outpatient Medications  Medication Sig Dispense Refill  . acetaminophen (TYLENOL) 500 MG tablet Take 1,000 mg by mouth every 6 (six) hours as needed for mild pain.     Marland Kitchen alendronate (FOSAMAX)  70 MG tablet Take 70 mg by mouth once a week. Wednesday  3  . butalbital-aspirin-caffeine (FIORINAL) 50-325-40 MG capsule Take 1 capsule by mouth every 6 (six) hours as needed for migraine.   1  . Calcium Carbonate-Vitamin D (CALCIUM-D PO) Take 1 tablet by mouth 2 (two) times daily.    . folic acid (FOLVITE) 1 MG tablet Take 1 tablet (1 mg total) by mouth daily. 30 tablet 4  . ibuprofen (ADVIL,MOTRIN) 200 MG tablet Take 600 mg by mouth every 6 (six) hours as needed for mild pain.     Marland Kitchen lidocaine-prilocaine (EMLA) cream Apply 1 application topically as needed. 30 g 0  . LUTEIN PO Take 1 tablet by mouth daily.    Marland Kitchen omeprazole (PRILOSEC) 20 MG capsule Take 1 capsule (20 mg total) by mouth daily. 30 capsule 2  . ondansetron (ZOFRAN) 8 MG tablet Take 1 tablet (8 mg total) by mouth every 8 (eight) hours as needed for nausea or vomiting. (Patient not taking: Reported on 07/04/2018) 20 tablet 0  . prochlorperazine (COMPAZINE) 10 MG tablet Take 1 tablet (10 mg total) by mouth every 6 (six) hours as needed for nausea or vomiting. (Patient not taking: Reported on 07/04/2018) 30 tablet 0  . traMADol (ULTRAM) 50 MG tablet Take 1 tablet (50 mg total) by mouth every 12 (twelve) hours as needed for moderate pain. 60 tablet 0  . zolpidem (AMBIEN) 10 MG tablet Take 1 tablet (10 mg total) by mouth at bedtime as needed for sleep. 30 tablet 0   No current facility-administered medications for this visit.  SURGICAL HISTORY:  Past Surgical History:  Procedure Laterality Date  . IR IMAGING GUIDED PORT INSERTION  06/26/2018    REVIEW OF SYSTEMS:  Constitutional: positive for fatigue Eyes: negative Ears, nose, mouth, throat, and face: negative Respiratory: positive for cough Cardiovascular: negative Gastrointestinal: negative Genitourinary:negative Integument/breast: negative Hematologic/lymphatic: negative Musculoskeletal:negative Neurological: negative Behavioral/Psych: negative Endocrine:  negative Allergic/Immunologic: negative   PHYSICAL EXAMINATION: General appearance: alert, cooperative and no distress Head: Normocephalic, without obvious abnormality, atraumatic Neck: no adenopathy, no JVD, supple, symmetrical, trachea midline and thyroid not enlarged, symmetric, no tenderness/mass/nodules Lymph nodes: Cervical, supraclavicular, and axillary nodes normal. Resp: clear to auscultation bilaterally Back: symmetric, no curvature. ROM normal. No CVA tenderness. Cardio: regular rate and rhythm, S1, S2 normal, no murmur, click, rub or gallop GI: soft, non-tender; bowel sounds normal; no masses,  no organomegaly Extremities: extremities normal, atraumatic, no cyanosis or edema Neurologic: Alert and oriented X 3, normal strength and tone. Normal symmetric reflexes. Normal coordination and gait  ECOG PERFORMANCE STATUS: 1 - Symptomatic but completely ambulatory  Blood pressure 102/64, pulse (!) 104, temperature 98.4 F (36.9 C), temperature source Oral, resp. rate 16, height 5\' 7"  (1.702 m), weight 119 lb 12.8 oz (54.3 kg), SpO2 100 %.  LABORATORY DATA: Lab Results  Component Value Date   WBC 7.7 08/16/2018   HGB 10.4 (L) 08/16/2018   HCT 33.0 (L) 08/16/2018   MCV 94.8 08/16/2018   PLT 354 08/16/2018      Chemistry      Component Value Date/Time   NA 139 07/24/2018 0846   K 4.0 07/24/2018 0846   CL 104 07/24/2018 0846   CO2 25 07/24/2018 0846   BUN 11 07/24/2018 0846   CREATININE 0.77 07/24/2018 0846      Component Value Date/Time   CALCIUM 9.2 07/24/2018 0846   ALKPHOS 84 07/24/2018 0846   AST 27 07/24/2018 0846   ALT 24 07/24/2018 0846   BILITOT <0.2 (L) 07/24/2018 0846       RADIOGRAPHIC STUDIES: Ct Soft Tissue Neck W Contrast  Result Date: 08/02/2018 CLINICAL DATA:  Stage IV lung cancer, continued surveillance. EXAM: CT NECK WITH CONTRAST TECHNIQUE: Multidetector CT imaging of the neck was performed using the standard protocol following the bolus  administration of intravenous contrast. CONTRAST:  141mL OMNIPAQUE IOHEXOL 300 MG/ML  SOLN COMPARISON:  MRI cervical spine 02/24/2018. PET scan 03/05/2018. MR brain 03/09/2018. FINDINGS: Pharynx and larynx: Normal. No mass or swelling. Salivary glands: No inflammation, mass, or stone. Thyroid: Normal. Lymph nodes: None enlarged or abnormal density. Vascular: Minor carotid bifurcation atherosclerosis. Limited intracranial: Negative intracranial compartment. RIGHT suboccipital scalp nodule, suspicious for subcutaneous metastasis identified on PET and MR brain, appears decreased in size, 3 x 5 mm as compared with 8 x 11 mm on prior MR. Visualized orbits: Negative. Mastoids and visualized paranasal sinuses: Clear. Skeleton: Destructive lesion involving the posterior elements at C2 and C3 has not progressed, and appears improved. Significant bulky mass associated with destruction of the C2 spinous process, roughly 28 x 15 x 15 mm, has significantly involuted. There is only minor cortical irregularity of the superior C3 spinous process. The abnormal anterior inferior C2 vertebral body noted on prior MR brain and cervical spine previously is not clearly abnormal on CT. Upper chest: Reported separately. Other: None IMPRESSION: No cervical lymphadenopathy is evident. Interval improvement, osseous C2 greater than C3 metastasis with involution of the significant bulky soft tissue mass. There is no visible pathologic fracture or significant destructive lesion of the anteroinferior C2  vertebral body. Similar favorable response to treatment of RIGHT suboccipital scalp nodule. Electronically Signed   By: Staci Righter M.D.   On: 08/02/2018 11:06   Ct Chest W Contrast  Result Date: 08/02/2018 CLINICAL DATA:  Non-small-cell lung cancer with bone metastasis. Radiation therapy to lung, hip, cervical spine. On chemotherapy. Nonsmoker. EXAM: CT CHEST, ABDOMEN, AND PELVIS WITH CONTRAST TECHNIQUE: Multidetector CT imaging of the  chest, abdomen and pelvis was performed following the standard protocol during bolus administration of intravenous contrast. CONTRAST:  114mL OMNIPAQUE IOHEXOL 300 MG/ML  SOLN COMPARISON:  06/28/2018 FINDINGS: CT CHEST FINDINGS Cardiovascular: Right Port-A-Cath tip mid right atrium. Aortic atherosclerosis. Normal heart size, without pericardial effusion. No central pulmonary embolism, on this non-dedicated study. Mediastinum/Nodes: No supraclavicular adenopathy. No mediastinal or hilar adenopathy. Lungs/Pleura: Trace right pleural fluid is similar. Posterior right upper lobe lung mass measures 3.5 x 3.4 cm on image 43/11. Decreased from 4.5 x 3.4 cm on the prior exam. Patchy surrounding airspace and ground-glass in the right upper lobe and superior segment right lower lobe is new and progressive, likely radiation induced. Biapical pleuroparenchymal scarring. Musculoskeletal: Similar osseous metastasis, including heterogeneous lesion in the T10 vertebral body. CT ABDOMEN PELVIS FINDINGS Hepatobiliary: Focal steatosis adjacent the falciform ligament. Normal gallbladder, without biliary ductal dilatation. Pancreas: Normal, without mass or ductal dilatation. Spleen: Normal in size, without focal abnormality. Adrenals/Urinary Tract: Normal right adrenal gland. Left adrenal nodule measures 1.6 x 1.0 cm today and is similar to on the prior exam. Normal kidneys, without hydronephrosis. Normal urinary bladder. Stomach/Bowel: Proximal gastric underdistention. Normal colon and terminal ileum. Normal small bowel. Vascular/Lymphatic: Separate origins of the splenic and common hepatic arteries. Aortic and branch vessel atherosclerosis. A retroperitoneal node along the SMA measures 8 x 11 mm on image 58/2 and is enlarged compared to approximately 4 mm on the prior. No pelvic sidewall adenopathy. Reproductive: Normal uterus and adnexa. Other: No significant free fluid. No evidence of omental or peritoneal disease. Musculoskeletal:  Right ischial lytic lesion is similar at 2.1 cm on image 117/2. A left acetabular sclerotic lesion is similar at 7 mm. Sclerotic lesion with compression deformity involving L2 is not significantly changed. The previously described nodule posterior to the left psoas muscle is no longer visualized. IMPRESSION: 1. Probable mixed response to therapy. Decreased size of a right upper lobe lung mass with enlargement of an upper abdominal retroperitoneal node. 2. Similar osseous and left adrenal metastasis. 3. Presumed evolving radiation changes in the right upper lobe and superior segment right lower lobe. Similar trace right pleural fluid. 4.  Aortic Atherosclerosis (ICD10-I70.0). Electronically Signed   By: Abigail Miyamoto M.D.   On: 08/02/2018 13:04   Ct Abdomen Pelvis W Contrast  Result Date: 08/02/2018 CLINICAL DATA:  Non-small-cell lung cancer with bone metastasis. Radiation therapy to lung, hip, cervical spine. On chemotherapy. Nonsmoker. EXAM: CT CHEST, ABDOMEN, AND PELVIS WITH CONTRAST TECHNIQUE: Multidetector CT imaging of the chest, abdomen and pelvis was performed following the standard protocol during bolus administration of intravenous contrast. CONTRAST:  157mL OMNIPAQUE IOHEXOL 300 MG/ML  SOLN COMPARISON:  06/28/2018 FINDINGS: CT CHEST FINDINGS Cardiovascular: Right Port-A-Cath tip mid right atrium. Aortic atherosclerosis. Normal heart size, without pericardial effusion. No central pulmonary embolism, on this non-dedicated study. Mediastinum/Nodes: No supraclavicular adenopathy. No mediastinal or hilar adenopathy. Lungs/Pleura: Trace right pleural fluid is similar. Posterior right upper lobe lung mass measures 3.5 x 3.4 cm on image 43/11. Decreased from 4.5 x 3.4 cm on the prior exam. Patchy surrounding airspace and  ground-glass in the right upper lobe and superior segment right lower lobe is new and progressive, likely radiation induced. Biapical pleuroparenchymal scarring. Musculoskeletal: Similar  osseous metastasis, including heterogeneous lesion in the T10 vertebral body. CT ABDOMEN PELVIS FINDINGS Hepatobiliary: Focal steatosis adjacent the falciform ligament. Normal gallbladder, without biliary ductal dilatation. Pancreas: Normal, without mass or ductal dilatation. Spleen: Normal in size, without focal abnormality. Adrenals/Urinary Tract: Normal right adrenal gland. Left adrenal nodule measures 1.6 x 1.0 cm today and is similar to on the prior exam. Normal kidneys, without hydronephrosis. Normal urinary bladder. Stomach/Bowel: Proximal gastric underdistention. Normal colon and terminal ileum. Normal small bowel. Vascular/Lymphatic: Separate origins of the splenic and common hepatic arteries. Aortic and branch vessel atherosclerosis. A retroperitoneal node along the SMA measures 8 x 11 mm on image 58/2 and is enlarged compared to approximately 4 mm on the prior. No pelvic sidewall adenopathy. Reproductive: Normal uterus and adnexa. Other: No significant free fluid. No evidence of omental or peritoneal disease. Musculoskeletal: Right ischial lytic lesion is similar at 2.1 cm on image 117/2. A left acetabular sclerotic lesion is similar at 7 mm. Sclerotic lesion with compression deformity involving L2 is not significantly changed. The previously described nodule posterior to the left psoas muscle is no longer visualized. IMPRESSION: 1. Probable mixed response to therapy. Decreased size of a right upper lobe lung mass with enlargement of an upper abdominal retroperitoneal node. 2. Similar osseous and left adrenal metastasis. 3. Presumed evolving radiation changes in the right upper lobe and superior segment right lower lobe. Similar trace right pleural fluid. 4.  Aortic Atherosclerosis (ICD10-I70.0). Electronically Signed   By: Abigail Miyamoto M.D.   On: 08/02/2018 13:04    ASSESSMENT AND PLAN: This is a very pleasant 62 years old white female with metastatic poorly differentiated malignant neoplasm of  unknown primary at this point but according to the pathologist at the cancer center it is likely poorly differentiated non-small cell carcinoma of lung primary. 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 PDL 1 expression was 99%. The molecular studies by foundation 1 as well as [employer] showed no actionable mutations. The patient was a started on treatment with systemic chemotherapy with carboplatin for AUC of 5, Alimta 500 mg/M2 and Keytruda 200 mg IV every 3 weeks.  She is status post 4 cycles with partial response.  The patient was a started on maintenance treatment with Alimta and Keytruda status post 2 cycles and has been tolerating the treatment well. She had repeat CT scan of the neck, chest, abdomen and pelvis performed recently.  I personally and independently reviewed the scan images and discussed the results with the patient and her husband.  Her scan showed improvement in the neck mass in addition to decrease in the right upper lobe lung mass but there was some enlargement of a upper retro-peritoneal abdominal node that need close monitoring. I recommended for the patient to continue her current treatment with maintenance Alimta and Keytruda for now.  I will monitor the retroperitoneal nodule and if it increase in size on the future imaging studies, and no other evidence of disease progression, we may consider her for radiotherapy to the solitary progressive lesion. The patient and her husband were in agreement with the current plan. She will proceed with cycle #3 of her treatment today. She will come back for follow-up visit in 3 weeks for evaluation with the start of cycle #4 of her maintenance treatment. For pain  management I gave her a refill of tramadol.  She also received refill for folic acid today. The patient was advised to call immediately if she has any concerning symptoms in the interval. .The patient voices understanding of current  disease status and treatment options and is in agreement with the current care plan.  All questions were answered. The patient knows to call the clinic with any problems, questions or concerns. We can certainly see the patient much sooner if necessary.  Disclaimer: This note was dictated with voice recognition software. Similar sounding words can inadvertently be transcribed and may not be corrected upon review.

## 2018-08-16 NOTE — Patient Instructions (Signed)
Rebecca Lucas Discharge Instructions for Patients Receiving Chemotherapy  Today you received the following chemotherapy agents: pembrolizumab [name]), pemetrexed (Alimta).  To help prevent nausea and vomiting after your treatment, we encourage you to take your nausea medication as directed. Received Aloxi during treatment today-->Take Compazine (not Zofran) for the next 3 days as needed.    If you develop nausea and vomiting that is not controlled by your nausea medication, call the clinic.   BELOW ARE SYMPTOMS THAT SHOULD BE REPORTED IMMEDIATELY:  *FEVER GREATER THAN 100.5 F  *CHILLS WITH OR WITHOUT FEVER  NAUSEA AND VOMITING THAT IS NOT CONTROLLED WITH YOUR NAUSEA MEDICATION  *UNUSUAL SHORTNESS OF BREATH  *UNUSUAL BRUISING OR BLEEDING  TENDERNESS IN MOUTH AND THROAT WITH OR WITHOUT PRESENCE OF ULCERS  *URINARY PROBLEMS  *BOWEL PROBLEMS  UNUSUAL RASH Items with * indicate a potential emergency and should be followed up as soon as possible.  Feel free to call the clinic should you have any questions or concerns. The clinic phone number is [phone].  Please show the [facility] at check-in to the Emergency Department and triage nurse.

## 2018-08-17 ENCOUNTER — Telehealth: Payer: Self-pay | Admitting: Internal Medicine

## 2018-08-17 DIAGNOSIS — Z0100 Encounter for examination of eyes and vision without abnormal findings: Secondary | ICD-10-CM | POA: Diagnosis not present

## 2018-08-17 NOTE — Telephone Encounter (Signed)
Scheduled appt per 12/5 los - pt to get an updated schedule next visit /.

## 2018-09-06 ENCOUNTER — Inpatient Hospital Stay (HOSPITAL_BASED_OUTPATIENT_CLINIC_OR_DEPARTMENT_OTHER): Payer: 59 | Admitting: Internal Medicine

## 2018-09-06 ENCOUNTER — Inpatient Hospital Stay: Payer: 59

## 2018-09-06 ENCOUNTER — Encounter: Payer: Self-pay | Admitting: Internal Medicine

## 2018-09-06 ENCOUNTER — Telehealth: Payer: Self-pay | Admitting: Internal Medicine

## 2018-09-06 VITALS — BP 114/57 | HR 101 | Temp 98.5°F | Resp 18 | Ht 67.0 in | Wt 120.4 lb

## 2018-09-06 DIAGNOSIS — C799 Secondary malignant neoplasm of unspecified site: Secondary | ICD-10-CM

## 2018-09-06 DIAGNOSIS — Z5189 Encounter for other specified aftercare: Secondary | ICD-10-CM | POA: Diagnosis not present

## 2018-09-06 DIAGNOSIS — C3411 Malignant neoplasm of upper lobe, right bronchus or lung: Secondary | ICD-10-CM | POA: Diagnosis not present

## 2018-09-06 DIAGNOSIS — R5383 Other fatigue: Secondary | ICD-10-CM | POA: Diagnosis not present

## 2018-09-06 DIAGNOSIS — Z5111 Encounter for antineoplastic chemotherapy: Secondary | ICD-10-CM

## 2018-09-06 DIAGNOSIS — I700 Atherosclerosis of aorta: Secondary | ICD-10-CM | POA: Diagnosis not present

## 2018-09-06 DIAGNOSIS — C7951 Secondary malignant neoplasm of bone: Secondary | ICD-10-CM

## 2018-09-06 DIAGNOSIS — Z79899 Other long term (current) drug therapy: Secondary | ICD-10-CM | POA: Diagnosis not present

## 2018-09-06 DIAGNOSIS — C7972 Secondary malignant neoplasm of left adrenal gland: Secondary | ICD-10-CM

## 2018-09-06 DIAGNOSIS — Z5112 Encounter for antineoplastic immunotherapy: Secondary | ICD-10-CM | POA: Diagnosis not present

## 2018-09-06 LAB — CBC WITH DIFFERENTIAL (CANCER CENTER ONLY)
Abs Immature Granulocytes: 0.06 10*3/uL (ref 0.00–0.07)
Basophils Absolute: 0 10*3/uL (ref 0.0–0.1)
Basophils Relative: 0 %
EOS PCT: 2 %
Eosinophils Absolute: 0.2 10*3/uL (ref 0.0–0.5)
HEMATOCRIT: 31.6 % — AB (ref 36.0–46.0)
HEMOGLOBIN: 10 g/dL — AB (ref 12.0–15.0)
Immature Granulocytes: 1 %
LYMPHS ABS: 0.4 10*3/uL — AB (ref 0.7–4.0)
LYMPHS PCT: 5 %
MCH: 28.3 pg (ref 26.0–34.0)
MCHC: 31.6 g/dL (ref 30.0–36.0)
MCV: 89.5 fL (ref 80.0–100.0)
MONO ABS: 0.7 10*3/uL (ref 0.1–1.0)
Monocytes Relative: 9 %
Neutro Abs: 7.1 10*3/uL (ref 1.7–7.7)
Neutrophils Relative %: 83 %
Platelet Count: 375 10*3/uL (ref 150–400)
RBC: 3.53 MIL/uL — ABNORMAL LOW (ref 3.87–5.11)
RDW: 15 % (ref 11.5–15.5)
WBC: 8.5 10*3/uL (ref 4.0–10.5)
nRBC: 0 % (ref 0.0–0.2)

## 2018-09-06 LAB — CMP (CANCER CENTER ONLY)
ALT: 36 U/L (ref 0–44)
AST: 35 U/L (ref 15–41)
Albumin: 2.6 g/dL — ABNORMAL LOW (ref 3.5–5.0)
Alkaline Phosphatase: 100 U/L (ref 38–126)
Anion gap: 7 (ref 5–15)
BUN: 9 mg/dL (ref 6–20)
CHLORIDE: 102 mmol/L (ref 98–111)
CO2: 28 mmol/L (ref 22–32)
CREATININE: 0.73 mg/dL (ref 0.44–1.00)
Calcium: 9.3 mg/dL (ref 8.9–10.3)
GFR, Est AFR Am: >60 mL/min (ref >60)
GFR, Estimated: >60 mL/min (ref >60)
Glucose, Bld: 134 mg/dL — ABNORMAL HIGH (ref 70–99)
Potassium: 4 mmol/L (ref 3.5–5.1)
Sodium: 137 mmol/L (ref 135–145)
Total Bilirubin: <0.2 mg/dL — ABNORMAL LOW (ref 0.3–1.2)
Total Protein: 6.7 g/dL (ref 6.5–8.1)

## 2018-09-06 LAB — TSH: TSH: 1.339 u[IU]/mL (ref 0.308–3.960)

## 2018-09-06 MED ORDER — DEXAMETHASONE SODIUM PHOSPHATE 10 MG/ML IJ SOLN
INTRAMUSCULAR | Status: AC
  Filled 2018-09-06: qty 1

## 2018-09-06 MED ORDER — SODIUM CHLORIDE 0.9% FLUSH
10.0000 mL | INTRAVENOUS | Status: DC | PRN
  Administered 2018-09-06: 10 mL
  Filled 2018-09-06: qty 10

## 2018-09-06 MED ORDER — SODIUM CHLORIDE 0.9 % IV SOLN
Freq: Once | INTRAVENOUS | Status: AC
  Filled 2018-09-06: qty 250

## 2018-09-06 MED ORDER — SODIUM CHLORIDE 0.9 % IV SOLN
200.0000 mg | Freq: Once | INTRAVENOUS | Status: AC
  Administered 2018-09-06: 200 mg via INTRAVENOUS
  Filled 2018-09-06: qty 8

## 2018-09-06 MED ORDER — SODIUM CHLORIDE 0.9 % IV SOLN
490.0000 mg/m2 | Freq: Once | INTRAVENOUS | Status: AC
  Administered 2018-09-06: 800 mg via INTRAVENOUS
  Filled 2018-09-06: qty 20

## 2018-09-06 MED ORDER — ONDANSETRON HCL 4 MG/2ML IJ SOLN
8.0000 mg | Freq: Once | INTRAMUSCULAR | Status: AC
  Administered 2018-09-06: 8 mg via INTRAVENOUS

## 2018-09-06 MED ORDER — DEXAMETHASONE SODIUM PHOSPHATE 10 MG/ML IJ SOLN
10.0000 mg | Freq: Once | INTRAMUSCULAR | Status: AC
  Administered 2018-09-06: 10 mg via INTRAVENOUS

## 2018-09-06 MED ORDER — SODIUM CHLORIDE 0.9 % IV SOLN
Freq: Once | INTRAVENOUS | Status: AC
  Administered 2018-09-06: 11:00:00 via INTRAVENOUS
  Filled 2018-09-06: qty 250

## 2018-09-06 MED ORDER — HEPARIN SOD (PORK) LOCK FLUSH 100 UNIT/ML IV SOLN
500.0000 [IU] | Freq: Once | INTRAVENOUS | Status: AC | PRN
  Administered 2018-09-06: 500 [IU]
  Filled 2018-09-06: qty 5

## 2018-09-06 MED ORDER — ONDANSETRON HCL 4 MG/2ML IJ SOLN
INTRAMUSCULAR | Status: AC
  Filled 2018-09-06: qty 4

## 2018-09-06 NOTE — Progress Notes (Signed)
[facility] [phone]   Fax:(336) [phone]  OFFICE PROGRESS NOTE  Donald Prose, MD [place] [address]  DIAGNOSIS: Metastatic poorly differentiated neoplasm questionable for early differentiated non-small cell carcinoma presented with large right upper lobe lung mass in addition to right neck and bone metastasis diagnosed and June 2019.  Metastatic sarcoma cannot be also excluded at this point. Molecular studies by foundation 1 showed no actionable mutations. PDL 1 expression 99%  PRIOR THERAPY:Palliative radiotherapy to the neck mass and hip lesion under the care of Dr. Tammi Klippel   CURRENT THERAPY: Systemic chemotherapy with carboplatin for AUC of 5, Alimta 500 mg/M2 and Keytruda 200 mg IV every 3 weeks.  First dose expected April 10, 2018.  Status post 7 cycles.  Starting from cycle #5 the patient is on maintenance treatment with Alimta and Keytruda.  INTERVAL HISTORY: Rebecca Lucas 62 y.o. female returns to the clinic today for follow-up visit.  The patient is feeling fine today with no concerning complaints.  She denied having any chest pain, shortness of breath except with exertion with no cough or hemoptysis.  She has no fever or chills.  She has no nausea, vomiting, diarrhea or constipation.  She denied having any headache or visual changes.  She continues to tolerate her treatment with Alimta and Keytruda fairly well.  The patient is here today for evaluation before starting cycle #8 of her treatment.  MEDICAL HISTORY:No past medical history on file.  ALLERGIES:  has No Known Allergies.  MEDICATIONS:  Current Outpatient Medications  Medication Sig Dispense Refill  . acetaminophen (TYLENOL) 500 MG tablet Take 1,000 mg by mouth every 6 (six) hours as needed for mild pain.     Marland Kitchen alendronate (FOSAMAX) 70 MG tablet Take 70 mg by mouth once a week. Wednesday  3  . butalbital-aspirin-caffeine (FIORINAL) 50-325-40 MG capsule  Take 1 capsule by mouth every 6 (six) hours as needed for migraine.   1  . Calcium Carbonate-Vitamin D (CALCIUM-D PO) Take 1 tablet by mouth 2 (two) times daily.    . folic acid (FOLVITE) 1 MG tablet Take 1 tablet (1 mg total) by mouth daily. 30 tablet 4  . ibuprofen (ADVIL,MOTRIN) 200 MG tablet Take 600 mg by mouth every 6 (six) hours as needed for mild pain.     Marland Kitchen lidocaine-prilocaine (EMLA) cream Apply 1 application topically as needed. 30 g 0  . LUTEIN PO Take 1 tablet by mouth daily.    Marland Kitchen omeprazole (PRILOSEC) 20 MG capsule Take 1 capsule (20 mg total) by mouth daily. 30 capsule 2  . ondansetron (ZOFRAN) 8 MG tablet Take 1 tablet (8 mg total) by mouth every 8 (eight) hours as needed for nausea or vomiting. (Patient not taking: Reported on 07/04/2018) 20 tablet 0  . prochlorperazine (COMPAZINE) 10 MG tablet Take 1 tablet (10 mg total) by mouth every 6 (six) hours as needed for nausea or vomiting. (Patient not taking: Reported on 07/04/2018) 30 tablet 0  . traMADol (ULTRAM) 50 MG tablet Take 1 tablet (50 mg total) by mouth every 12 (twelve) hours as needed for moderate pain. 60 tablet 0  . zolpidem (AMBIEN) 10 MG tablet Take 1 tablet (10 mg total) by mouth at bedtime as needed for sleep. 30 tablet 0   No current facility-administered medications for this visit.     SURGICAL HISTORY:  Past Surgical History:  Procedure Laterality Date  . IR IMAGING GUIDED PORT  INSERTION  06/26/2018    REVIEW OF SYSTEMS:  A comprehensive review of systems was negative except for: Constitutional: positive for fatigue Respiratory: positive for dyspnea on exertion   PHYSICAL EXAMINATION: General appearance: alert, cooperative and no distress Head: Normocephalic, without obvious abnormality, atraumatic Neck: no adenopathy, no JVD, supple, symmetrical, trachea midline and thyroid not enlarged, symmetric, no tenderness/mass/nodules Lymph nodes: Cervical, supraclavicular, and axillary nodes normal. Resp: clear  to auscultation bilaterally Back: symmetric, no curvature. ROM normal. No CVA tenderness. Cardio: regular rate and rhythm, S1, S2 normal, no murmur, click, rub or gallop GI: soft, non-tender; bowel sounds normal; no masses,  no organomegaly Extremities: extremities normal, atraumatic, no cyanosis or edema  ECOG PERFORMANCE STATUS: 1 - Symptomatic but completely ambulatory  Blood pressure (!) 114/57, pulse (!) 101, temperature 98.5 F (36.9 C), temperature source Oral, resp. rate 18, height 5\' 7"  (1.702 m), weight 120 lb 6.4 oz (54.6 kg), SpO2 99 %.  LABORATORY DATA: Lab Results  Component Value Date   WBC 8.5 09/06/2018   HGB 10.0 (L) 09/06/2018   HCT 31.6 (L) 09/06/2018   MCV 89.5 09/06/2018   PLT 375 09/06/2018      Chemistry      Component Value Date/Time   NA 139 08/16/2018 1118   K 4.4 08/16/2018 1118   CL 104 08/16/2018 1118   CO2 26 08/16/2018 1118   BUN 12 08/16/2018 1118   CREATININE 0.76 08/16/2018 1118      Component Value Date/Time   CALCIUM 9.3 08/16/2018 1118   ALKPHOS 98 08/16/2018 1118   AST 44 (H) 08/16/2018 1118   ALT 49 (H) 08/16/2018 1118   BILITOT <0.2 (L) 08/16/2018 1118       RADIOGRAPHIC STUDIES: No results found.  ASSESSMENT AND PLAN: This is a very pleasant 62 years old white female with metastatic poorly differentiated malignant neoplasm of unknown primary at this point but according to the pathologist at the cancer center it is likely poorly differentiated non-small cell carcinoma of lung primary. 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 PDL 1 expression was 99%. The molecular studies by foundation 1 as well as [employer] showed no actionable mutations. The patient was a started on treatment with systemic chemotherapy with carboplatin for AUC of 5, Alimta 500 mg/M2 and Keytruda 200 mg IV every 3 weeks.  She is status post 4 cycles with partial response.  The patient was started on maintenance  treatment with Alimta and Keytruda status post 3 cycles. She has been tolerating this treatment well with no concerning adverse effect except for mild fatigue. I recommended for the patient to proceed with cycle #4 of her maintenance treatment today as scheduled. I will see her back for follow-up visit in 3 weeks for evaluation before starting cycle #5 of her maintenance treatment. She was advised to call immediately if she has any concerning symptoms in the interval. .The patient voices understanding of current disease status and treatment options and is in agreement with the current care plan.  All questions were answered. The patient knows to call the clinic with any problems, questions or concerns. We can certainly see the patient much sooner if necessary.  Disclaimer: This note was dictated with voice recognition software. Similar sounding words can inadvertently be transcribed and may not be corrected upon review.

## 2018-09-06 NOTE — Telephone Encounter (Signed)
Scheduled patients appointment per 12/26 los.

## 2018-09-06 NOTE — Patient Instructions (Signed)
[place] Discharge Instructions for Patients Receiving Chemotherapy  Today you received the following chemotherapy agents Keytruda and Alimta  To help prevent nausea and vomiting after your treatment, we encourage you to take your nausea medication as directed If you develop nausea and vomiting that is not controlled by your nausea medication, call the clinic.   BELOW ARE SYMPTOMS THAT SHOULD BE REPORTED IMMEDIATELY:  *FEVER GREATER THAN 100.5 F  *CHILLS WITH OR WITHOUT FEVER  NAUSEA AND VOMITING THAT IS NOT CONTROLLED WITH YOUR NAUSEA MEDICATION  *UNUSUAL SHORTNESS OF BREATH  *UNUSUAL BRUISING OR BLEEDING  TENDERNESS IN MOUTH AND THROAT WITH OR WITHOUT PRESENCE OF ULCERS  *URINARY PROBLEMS  *BOWEL PROBLEMS  UNUSUAL RASH Items with * indicate a potential emergency and should be followed up as soon as possible.  Feel free to call the clinic should you have any questions or concerns. The clinic phone number is [phone].  Please show the [facility] at check-in to the Emergency Department and triage nurse.

## 2018-09-20 MED FILL — ALENDRONATE NA 70 MG TAB: 70 | 84 days supply | Qty: 12 | Fill #3

## 2018-09-27 ENCOUNTER — Inpatient Hospital Stay: Payer: 59

## 2018-09-27 ENCOUNTER — Encounter: Payer: Self-pay | Admitting: Internal Medicine

## 2018-09-27 ENCOUNTER — Other Ambulatory Visit: Payer: Self-pay | Admitting: Internal Medicine

## 2018-09-27 ENCOUNTER — Inpatient Hospital Stay (HOSPITAL_BASED_OUTPATIENT_CLINIC_OR_DEPARTMENT_OTHER): Payer: 59 | Admitting: Internal Medicine

## 2018-09-27 ENCOUNTER — Inpatient Hospital Stay: Payer: 59 | Attending: Internal Medicine

## 2018-09-27 VITALS — BP 104/63 | HR 113 | Temp 98.5°F | Resp 18 | Ht 67.0 in | Wt 120.2 lb

## 2018-09-27 VITALS — HR 101

## 2018-09-27 DIAGNOSIS — C7972 Secondary malignant neoplasm of left adrenal gland: Secondary | ICD-10-CM | POA: Diagnosis not present

## 2018-09-27 DIAGNOSIS — R0602 Shortness of breath: Secondary | ICD-10-CM | POA: Insufficient documentation

## 2018-09-27 DIAGNOSIS — Z5112 Encounter for antineoplastic immunotherapy: Secondary | ICD-10-CM

## 2018-09-27 DIAGNOSIS — C3411 Malignant neoplasm of upper lobe, right bronchus or lung: Secondary | ICD-10-CM

## 2018-09-27 DIAGNOSIS — C7800 Secondary malignant neoplasm of unspecified lung: Secondary | ICD-10-CM | POA: Insufficient documentation

## 2018-09-27 DIAGNOSIS — C3490 Malignant neoplasm of unspecified part of unspecified bronchus or lung: Secondary | ICD-10-CM

## 2018-09-27 DIAGNOSIS — C7951 Secondary malignant neoplasm of bone: Secondary | ICD-10-CM

## 2018-09-27 DIAGNOSIS — Z5111 Encounter for antineoplastic chemotherapy: Secondary | ICD-10-CM | POA: Insufficient documentation

## 2018-09-27 DIAGNOSIS — Z7982 Long term (current) use of aspirin: Secondary | ICD-10-CM | POA: Insufficient documentation

## 2018-09-27 DIAGNOSIS — C799 Secondary malignant neoplasm of unspecified site: Secondary | ICD-10-CM

## 2018-09-27 DIAGNOSIS — C801 Malignant (primary) neoplasm, unspecified: Secondary | ICD-10-CM | POA: Insufficient documentation

## 2018-09-27 DIAGNOSIS — Z79899 Other long term (current) drug therapy: Secondary | ICD-10-CM | POA: Diagnosis not present

## 2018-09-27 DIAGNOSIS — R5383 Other fatigue: Secondary | ICD-10-CM

## 2018-09-27 LAB — CBC WITH DIFFERENTIAL (CANCER CENTER ONLY)
Abs Immature Granulocytes: 0.07 10*3/uL (ref 0.00–0.07)
BASOS PCT: 1 %
Basophils Absolute: 0 10*3/uL (ref 0.0–0.1)
Eosinophils Absolute: 0.2 10*3/uL (ref 0.0–0.5)
Eosinophils Relative: 2 %
HCT: 30.9 % — ABNORMAL LOW (ref 36.0–46.0)
Hemoglobin: 9.6 g/dL — ABNORMAL LOW (ref 12.0–15.0)
Immature Granulocytes: 1 %
Lymphocytes Relative: 6 %
Lymphs Abs: 0.5 10*3/uL — ABNORMAL LOW (ref 0.7–4.0)
MCH: 27.7 pg (ref 26.0–34.0)
MCHC: 31.1 g/dL (ref 30.0–36.0)
MCV: 89.3 fL (ref 80.0–100.0)
Monocytes Absolute: 0.7 10*3/uL (ref 0.1–1.0)
Monocytes Relative: 9 %
NEUTROS ABS: 6.7 10*3/uL (ref 1.7–7.7)
Neutrophils Relative %: 81 %
PLATELETS: 433 10*3/uL — AB (ref 150–400)
RBC: 3.46 MIL/uL — ABNORMAL LOW (ref 3.87–5.11)
RDW: 16.1 % — ABNORMAL HIGH (ref 11.5–15.5)
WBC: 8.1 10*3/uL (ref 4.0–10.5)
nRBC: 0 % (ref 0.0–0.2)

## 2018-09-27 LAB — CMP (CANCER CENTER ONLY)
ALT: 38 U/L (ref 0–44)
AST: 40 U/L (ref 15–41)
Albumin: 2.8 g/dL — ABNORMAL LOW (ref 3.5–5.0)
Alkaline Phosphatase: 95 U/L (ref 38–126)
Anion gap: 8 (ref 5–15)
BUN: 11 mg/dL (ref 6–20)
CO2: 27 mmol/L (ref 22–32)
Calcium: 9.4 mg/dL (ref 8.9–10.3)
Chloride: 103 mmol/L (ref 98–111)
Creatinine: 0.72 mg/dL (ref 0.44–1.00)
GFR, Estimated: >60 mL/min (ref >60)
Glucose, Bld: 114 mg/dL — ABNORMAL HIGH (ref 70–99)
Potassium: 4.1 mmol/L (ref 3.5–5.1)
Sodium: 138 mmol/L (ref 135–145)
Total Bilirubin: <0.2 mg/dL — ABNORMAL LOW (ref 0.3–1.2)
Total Protein: 7 g/dL (ref 6.5–8.1)

## 2018-09-27 LAB — TSH: TSH: 2.002 u[IU]/mL (ref 0.308–3.960)

## 2018-09-27 MED ORDER — HEPARIN SOD (PORK) LOCK FLUSH 100 UNIT/ML IV SOLN
500.0000 [IU] | Freq: Once | INTRAVENOUS | Status: AC | PRN
  Administered 2018-09-27: 500 [IU]
  Filled 2018-09-27: qty 5

## 2018-09-27 MED ORDER — ONDANSETRON HCL 4 MG/2ML IJ SOLN
INTRAMUSCULAR | Status: AC
  Filled 2018-09-27: qty 2

## 2018-09-27 MED ORDER — SODIUM CHLORIDE 0.9 % IV SOLN
Freq: Once | INTRAVENOUS | Status: AC
  Administered 2018-09-27: 12:00:00 via INTRAVENOUS
  Filled 2018-09-27: qty 250

## 2018-09-27 MED ORDER — FOLIC ACID 1 MG PO TABS: 1.0000 mg | ORAL_TABLET | Freq: Every day | ORAL | 1 refills | Status: DC

## 2018-09-27 MED ORDER — SODIUM CHLORIDE 0.9 % IV SOLN
490.0000 mg/m2 | Freq: Once | INTRAVENOUS | Status: AC
  Administered 2018-09-27: 800 mg via INTRAVENOUS
  Filled 2018-09-27: qty 20

## 2018-09-27 MED ORDER — DEXAMETHASONE SODIUM PHOSPHATE 10 MG/ML IJ SOLN
INTRAMUSCULAR | Status: AC
  Filled 2018-09-27: qty 1

## 2018-09-27 MED ORDER — SODIUM CHLORIDE 0.9% FLUSH
10.0000 mL | INTRAVENOUS | Status: DC | PRN
  Administered 2018-09-27: 10 mL
  Filled 2018-09-27: qty 10

## 2018-09-27 MED ORDER — OMEPRAZOLE 20 MG PO CPDR: 20.0000 mg | DELAYED_RELEASE_CAPSULE | Freq: Every day | ORAL | 1 refills | Status: DC

## 2018-09-27 MED ORDER — DEXAMETHASONE SODIUM PHOSPHATE 10 MG/ML IJ SOLN
10.0000 mg | Freq: Once | INTRAMUSCULAR | Status: AC
  Administered 2018-09-27: 10 mg via INTRAVENOUS

## 2018-09-27 MED ORDER — ONDANSETRON HCL 4 MG/2ML IJ SOLN
8.0000 mg | Freq: Once | INTRAMUSCULAR | Status: AC
  Administered 2018-09-27: 8 mg via INTRAVENOUS

## 2018-09-27 MED ORDER — SODIUM CHLORIDE 0.9 % IV SOLN
200.0000 mg | Freq: Once | INTRAVENOUS | Status: AC
  Administered 2018-09-27: 200 mg via INTRAVENOUS
  Filled 2018-09-27: qty 8

## 2018-09-27 MED FILL — FOLIC ACID 1 MG TABS: 1 | 30 days supply | Qty: 30 | Fill #1

## 2018-09-27 MED FILL — OMEPRAZOLE 20 MG CPDR: 20 | 90 days supply | Qty: 90 | Fill #0

## 2018-09-27 NOTE — Progress Notes (Signed)
OK to [facility] w/ [facility] per Dr. Earlie Server

## 2018-09-27 NOTE — Progress Notes (Signed)
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  Metastatic sarcoma cannot be also excluded at this point. Molecular studies by [employer] 1 showed no actionable mutations. PDL 1 expression 99%  PRIOR THERAPY:Palliative radiotherapy to the neck mass and hip lesion under the care of Dr. Tammi Klippel   CURRENT THERAPY: Systemic chemotherapy with carboplatin for AUC of 5, Alimta 500 mg/M2 and Keytruda 200 mg IV every 3 weeks.  First dose expected April 10, 2018.  Status post 8 cycles.  Starting from cycle #5 the patient is on maintenance treatment with Alimta and Keytruda.  INTERVAL HISTORY: Rebecca Lucas 63 y.o. female returns to the clinic today for follow-up visit.  The patient is feeling fine today with no specific complaints except for persistent shortness of breath and fatigue.  She exercise several times a week.  She denied having any chest pain, cough or hemoptysis.  She denied having any fever or chills.  She has no nausea, vomiting, diarrhea or constipation.  She denied having any headache or visual changes.  She has been tolerating her treatment with maintenance Alimta and Keytruda fairly well.  The patient is here today for evaluation before starting cycle #9 of her treatment.   MEDICAL HISTORY:No past medical history on file.  ALLERGIES:  has No Known Allergies.  MEDICATIONS:  Current Outpatient Medications  Medication Sig Dispense Refill  . acetaminophen (TYLENOL) 500 MG tablet Take 1,000 mg by mouth every 6 (six) hours as needed for mild pain.     Marland Kitchen alendronate (FOSAMAX) 70 MG tablet Take 70 mg by mouth once a week. Wednesday  3    . butalbital-aspirin-caffeine (FIORINAL) 50-325-40 MG capsule Take 1 capsule by mouth every 6 (six) hours as needed for migraine.   1  . Calcium Carbonate-Vitamin D (CALCIUM-D PO) Take 1 tablet by mouth 2 (two) times daily.    . folic acid (FOLVITE) 1 MG tablet Take 1 tablet (1 mg total) by mouth daily. 30 tablet 4  . ibuprofen (ADVIL,MOTRIN) 200 MG tablet Take 600 mg by mouth every 6 (six) hours as needed for mild pain.     Marland Kitchen lidocaine-prilocaine (EMLA) cream Apply 1 application topically as needed. 30 g 0  . LUTEIN PO Take 1 tablet by mouth daily.    Marland Kitchen omeprazole (PRILOSEC) 20 MG capsule Take 1 capsule (20 mg total) by mouth daily. 30 capsule 2  . ondansetron (ZOFRAN) 8 MG tablet Take 1 tablet (8 mg total) by mouth every 8 (eight) hours as needed for nausea or vomiting. (Patient not taking: Reported on 07/04/2018) 20 tablet 0  . prochlorperazine (COMPAZINE) 10 MG tablet Take 1 tablet (10 mg total) by mouth every 6 (six) hours as needed for nausea or vomiting. (Patient not taking: Reported on 07/04/2018) 30 tablet 0  . traMADol (ULTRAM) 50 MG tablet Take 1 tablet (50 mg total) by mouth every 12 (twelve) hours as needed for moderate pain. 60 tablet 0  . zolpidem (AMBIEN) 10 MG tablet Take 1 tablet (10 mg total) by mouth at bedtime as needed for sleep. 30 tablet 0   No current facility-administered medications for this visit.     SURGICAL HISTORY:  Past Surgical  History:  Procedure Laterality Date  . IR IMAGING GUIDED PORT INSERTION  06/26/2018    REVIEW OF SYSTEMS:  A comprehensive review of systems was negative except for: Constitutional: positive for fatigue Respiratory: positive for dyspnea on exertion   PHYSICAL EXAMINATION: General appearance: alert, cooperative, fatigued and no distress Head: Normocephalic, without obvious abnormality, atraumatic Neck: no adenopathy, no JVD, supple, symmetrical, trachea midline and thyroid not enlarged, symmetric, no tenderness/mass/nodules Lymph  nodes: Cervical, supraclavicular, and axillary nodes normal. Resp: clear to auscultation bilaterally Back: symmetric, no curvature. ROM normal. No CVA tenderness. Cardio: regular rate and rhythm, S1, S2 normal, no murmur, click, rub or gallop GI: soft, non-tender; bowel sounds normal; no masses,  no organomegaly Extremities: extremities normal, atraumatic, no cyanosis or edema  ECOG PERFORMANCE STATUS: 1 - Symptomatic but completely ambulatory  Blood pressure 104/63, pulse (!) 113, temperature 98.5 F (36.9 C), temperature source Oral, resp. rate 18, height 5\' 7"  (1.702 m), weight 120 lb 3.2 oz (54.5 kg), SpO2 100 %.  LABORATORY DATA: Lab Results  Component Value Date   WBC 8.1 09/27/2018   HGB 9.6 (L) 09/27/2018   HCT 30.9 (L) 09/27/2018   MCV 89.3 09/27/2018   PLT 433 (H) 09/27/2018      Chemistry      Component Value Date/Time   NA 137 09/06/2018 0938   K 4.0 09/06/2018 0938   CL 102 09/06/2018 0938   CO2 28 09/06/2018 0938   BUN 9 09/06/2018 0938   CREATININE 0.73 09/06/2018 0938      Component Value Date/Time   CALCIUM 9.3 09/06/2018 0938   ALKPHOS 100 09/06/2018 0938   AST 35 09/06/2018 0938   ALT 36 09/06/2018 0938   BILITOT <0.2 (L) 09/06/2018 8921       RADIOGRAPHIC STUDIES: No results found.  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 PDL 1 expression was 99%. The molecular studies by foundation 1 as well as [employer] showed no actionable mutations. The patient was a started on treatment with systemic chemotherapy with carboplatin for AUC of 5, Alimta 500 mg/M2 and Keytruda 200 mg IV every 3 weeks.  She is status post 4 cycles with  partial response.  The patient was started on maintenance treatment with Alimta and Keytruda status post 4 cycles. She has been tolerating this treatment well with no concerning adverse effects. I recommended for the patient to proceed with cycle #5 of her maintenance treatment today as scheduled. I will see her back for follow-up visit in 3 weeks for evaluation after repeating CT scan of the neck, chest, abdomen and pelvis for restaging of her disease. The patient was advised to call immediately if she has any concerning symptoms in the interval. The patient voices understanding of current disease status and treatment options and is in agreement with the current care plan.  All questions were answered. The patient knows to call the clinic with any problems, questions or concerns. We can certainly see the patient much sooner if necessary.  Disclaimer: This note was dictated with voice recognition software. Similar sounding words can inadvertently be transcribed and may not be corrected upon review.

## 2018-10-16 ENCOUNTER — Ambulatory Visit (HOSPITAL_COMMUNITY)
Admission: RE | Admit: 2018-10-16 | Discharge: 2018-10-16 | Disposition: A | Discharge status: Home / Self Care | Payer: 59 | Source: Ambulatory Visit | Attending: Internal Medicine | Admitting: Internal Medicine

## 2018-10-16 ENCOUNTER — Encounter (HOSPITAL_COMMUNITY): Payer: Self-pay

## 2018-10-16 DIAGNOSIS — C412 Malignant neoplasm of vertebral column: Secondary | ICD-10-CM | POA: Diagnosis not present

## 2018-10-16 DIAGNOSIS — Z85118 Personal history of other malignant neoplasm of bronchus and lung: Secondary | ICD-10-CM | POA: Diagnosis not present

## 2018-10-16 DIAGNOSIS — Z5111 Encounter for antineoplastic chemotherapy: Secondary | ICD-10-CM | POA: Diagnosis not present

## 2018-10-16 DIAGNOSIS — C3490 Malignant neoplasm of unspecified part of unspecified bronchus or lung: Secondary | ICD-10-CM | POA: Insufficient documentation

## 2018-10-16 MED ORDER — SODIUM CHLORIDE (PF) 0.9 % IJ SOLN
INTRAMUSCULAR | Status: AC
  Filled 2018-10-16: qty 50

## 2018-10-16 MED ORDER — HEPARIN SOD (PORK) LOCK FLUSH 100 UNIT/ML IV SOLN
INTRAVENOUS | Status: AC
  Filled 2018-10-16: qty 5

## 2018-10-16 MED ORDER — HEPARIN SOD (PORK) LOCK FLUSH 100 UNIT/ML IV SOLN
500.0000 [IU] | Freq: Once | INTRAVENOUS | Status: AC
  Administered 2018-10-16: 500 [IU] via INTRAVENOUS

## 2018-10-16 MED ORDER — IOHEXOL 300 MG/ML  SOLN
100.0000 mL | Freq: Once | INTRAMUSCULAR | Status: AC | PRN
  Administered 2018-10-16: 100 mL via INTRAVENOUS

## 2018-10-18 ENCOUNTER — Encounter: Payer: Self-pay | Admitting: Internal Medicine

## 2018-10-18 ENCOUNTER — Inpatient Hospital Stay: Payer: 59 | Attending: Internal Medicine

## 2018-10-18 ENCOUNTER — Inpatient Hospital Stay: Payer: 59

## 2018-10-18 ENCOUNTER — Inpatient Hospital Stay (HOSPITAL_BASED_OUTPATIENT_CLINIC_OR_DEPARTMENT_OTHER): Payer: 59 | Admitting: Internal Medicine

## 2018-10-18 VITALS — HR 104

## 2018-10-18 VITALS — BP 113/66 | HR 118 | Temp 99.6°F | Resp 18 | Ht 67.0 in | Wt 122.0 lb

## 2018-10-18 DIAGNOSIS — C792 Secondary malignant neoplasm of skin: Secondary | ICD-10-CM | POA: Diagnosis not present

## 2018-10-18 DIAGNOSIS — Z5112 Encounter for antineoplastic immunotherapy: Secondary | ICD-10-CM | POA: Diagnosis not present

## 2018-10-18 DIAGNOSIS — C7951 Secondary malignant neoplasm of bone: Secondary | ICD-10-CM

## 2018-10-18 DIAGNOSIS — C7800 Secondary malignant neoplasm of unspecified lung: Secondary | ICD-10-CM

## 2018-10-18 DIAGNOSIS — C7972 Secondary malignant neoplasm of left adrenal gland: Secondary | ICD-10-CM | POA: Diagnosis not present

## 2018-10-18 DIAGNOSIS — C801 Malignant (primary) neoplasm, unspecified: Secondary | ICD-10-CM | POA: Diagnosis not present

## 2018-10-18 DIAGNOSIS — C799 Secondary malignant neoplasm of unspecified site: Secondary | ICD-10-CM

## 2018-10-18 DIAGNOSIS — G893 Neoplasm related pain (acute) (chronic): Secondary | ICD-10-CM

## 2018-10-18 DIAGNOSIS — C3411 Malignant neoplasm of upper lobe, right bronchus or lung: Secondary | ICD-10-CM

## 2018-10-18 DIAGNOSIS — Z79899 Other long term (current) drug therapy: Secondary | ICD-10-CM | POA: Insufficient documentation

## 2018-10-18 DIAGNOSIS — Z5111 Encounter for antineoplastic chemotherapy: Secondary | ICD-10-CM

## 2018-10-18 DIAGNOSIS — D72829 Elevated white blood cell count, unspecified: Secondary | ICD-10-CM | POA: Insufficient documentation

## 2018-10-18 LAB — CBC WITH DIFFERENTIAL (CANCER CENTER ONLY)
Abs Immature Granulocytes: 0.12 10*3/uL — ABNORMAL HIGH (ref 0.00–0.07)
BASOS ABS: 0 10*3/uL (ref 0.0–0.1)
Basophils Relative: 0 %
EOS PCT: 2 %
Eosinophils Absolute: 0.3 10*3/uL (ref 0.0–0.5)
HCT: 30.5 % — ABNORMAL LOW (ref 36.0–46.0)
Hemoglobin: 9.3 g/dL — ABNORMAL LOW (ref 12.0–15.0)
IMMATURE GRANULOCYTES: 1 %
Lymphocytes Relative: 4 %
Lymphs Abs: 0.5 10*3/uL — ABNORMAL LOW (ref 0.7–4.0)
MCH: 26.4 pg (ref 26.0–34.0)
MCHC: 30.5 g/dL (ref 30.0–36.0)
MCV: 86.6 fL (ref 80.0–100.0)
Monocytes Absolute: 0.9 10*3/uL (ref 0.1–1.0)
Monocytes Relative: 7 %
NRBC: 0 % (ref 0.0–0.2)
Neutro Abs: 10.4 10*3/uL — ABNORMAL HIGH (ref 1.7–7.7)
Neutrophils Relative %: 86 %
Platelet Count: 518 10*3/uL — ABNORMAL HIGH (ref 150–400)
RBC: 3.52 MIL/uL — ABNORMAL LOW (ref 3.87–5.11)
RDW: 16.9 % — AB (ref 11.5–15.5)
WBC Count: 12.2 10*3/uL — ABNORMAL HIGH (ref 4.0–10.5)

## 2018-10-18 LAB — CMP (CANCER CENTER ONLY)
ALK PHOS: 98 U/L (ref 38–126)
ALT: 32 U/L (ref 0–44)
AST: 38 U/L (ref 15–41)
Albumin: 2.5 g/dL — ABNORMAL LOW (ref 3.5–5.0)
Anion gap: 9 (ref 5–15)
BUN: 10 mg/dL (ref 6–20)
CO2: 27 mmol/L (ref 22–32)
Calcium: 9.3 mg/dL (ref 8.9–10.3)
Chloride: 102 mmol/L (ref 98–111)
Creatinine: 0.75 mg/dL (ref 0.44–1.00)
GFR, Est AFR Am: >60 mL/min (ref >60)
GFR, Estimated: >60 mL/min (ref >60)
Glucose, Bld: 125 mg/dL — ABNORMAL HIGH (ref 70–99)
Potassium: 4.2 mmol/L (ref 3.5–5.1)
SODIUM: 138 mmol/L (ref 135–145)
Total Bilirubin: 0.4 mg/dL (ref 0.3–1.2)
Total Protein: 6.8 g/dL (ref 6.5–8.1)

## 2018-10-18 LAB — TSH: TSH: 3.016 u[IU]/mL (ref 0.308–3.960)

## 2018-10-18 MED ORDER — CYANOCOBALAMIN 1000 MCG/ML IJ SOLN
1000.0000 ug | Freq: Once | INTRAMUSCULAR | Status: AC
  Administered 2018-10-18: 1000 ug via INTRAMUSCULAR

## 2018-10-18 MED ORDER — HEPARIN SOD (PORK) LOCK FLUSH 100 UNIT/ML IV SOLN
500.0000 [IU] | Freq: Once | INTRAVENOUS | Status: AC | PRN
  Administered 2018-10-18: 500 [IU]
  Filled 2018-10-18: qty 5

## 2018-10-18 MED ORDER — CYANOCOBALAMIN 1000 MCG/ML IJ SOLN
INTRAMUSCULAR | Status: AC
  Filled 2018-10-18: qty 1

## 2018-10-18 MED ORDER — SODIUM CHLORIDE 0.9 % IV SOLN
200.0000 mg | Freq: Once | INTRAVENOUS | Status: AC
  Administered 2018-10-18: 200 mg via INTRAVENOUS
  Filled 2018-10-18: qty 8

## 2018-10-18 MED ORDER — SODIUM CHLORIDE 0.9 % IV SOLN
490.0000 mg/m2 | Freq: Once | INTRAVENOUS | Status: AC
  Administered 2018-10-18: 800 mg via INTRAVENOUS
  Filled 2018-10-18: qty 20

## 2018-10-18 MED ORDER — ONDANSETRON HCL 4 MG/2ML IJ SOLN
INTRAMUSCULAR | Status: AC
  Filled 2018-10-18: qty 2

## 2018-10-18 MED ORDER — SODIUM CHLORIDE 0.9 % IV SOLN
Freq: Once | INTRAVENOUS | Status: AC
  Administered 2018-10-18: 12:00:00 via INTRAVENOUS
  Filled 2018-10-18: qty 250

## 2018-10-18 MED ORDER — SODIUM CHLORIDE 0.9% FLUSH
10.0000 mL | INTRAVENOUS | Status: DC | PRN
  Administered 2018-10-18: 10 mL
  Filled 2018-10-18: qty 10

## 2018-10-18 MED ORDER — DEXAMETHASONE SODIUM PHOSPHATE 10 MG/ML IJ SOLN
10.0000 mg | Freq: Once | INTRAMUSCULAR | Status: AC
  Administered 2018-10-18: 10 mg via INTRAVENOUS

## 2018-10-18 MED ORDER — DEXAMETHASONE SODIUM PHOSPHATE 10 MG/ML IJ SOLN
INTRAMUSCULAR | Status: AC
  Filled 2018-10-18: qty 1

## 2018-10-18 MED ORDER — ONDANSETRON HCL 4 MG/2ML IJ SOLN
8.0000 mg | Freq: Once | INTRAMUSCULAR | Status: AC
  Administered 2018-10-18: 8 mg via INTRAVENOUS

## 2018-10-18 NOTE — Patient Instructions (Signed)
[place] Discharge Instructions for Patients Receiving Chemotherapy  Today you received the following chemotherapy agents Keytruda and Alimta  To help prevent nausea and vomiting after your treatment, we encourage you to take your nausea medication as directed If you develop nausea and vomiting that is not controlled by your nausea medication, call the clinic.   BELOW ARE SYMPTOMS THAT SHOULD BE REPORTED IMMEDIATELY:  *FEVER GREATER THAN 100.5 F  *CHILLS WITH OR WITHOUT FEVER  NAUSEA AND VOMITING THAT IS NOT CONTROLLED WITH YOUR NAUSEA MEDICATION  *UNUSUAL SHORTNESS OF BREATH  *UNUSUAL BRUISING OR BLEEDING  TENDERNESS IN MOUTH AND THROAT WITH OR WITHOUT PRESENCE OF ULCERS  *URINARY PROBLEMS  *BOWEL PROBLEMS  UNUSUAL RASH Items with * indicate a potential emergency and should be followed up as soon as possible.  Feel free to call the clinic should you have any questions or concerns. The clinic phone number is [phone].  Please show the [facility] at check-in to the Emergency Department and triage nurse.

## 2018-10-18 NOTE — Progress Notes (Signed)
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  Metastatic sarcoma cannot be also excluded at this point. Molecular studies by [facility] 1 showed no actionable mutations. PDL 1 expression 99%  PRIOR THERAPY:Palliative radiotherapy to the neck mass and hip lesion under the care of Dr. Tammi Klippel   CURRENT THERAPY: Systemic chemotherapy with carboplatin for AUC of 5, Alimta 500 mg/M2 and Keytruda 200 mg IV every 3 weeks.  First dose expected April 10, 2018.  Status post 9 cycles.  Starting from cycle #5 the patient is on maintenance treatment with Alimta and Keytruda.  INTERVAL HISTORY: Rebecca Lucas 63 y.o. female returns to the clinic today for follow-up visit accompanied by her husband.  The patient is feeling fine today with no concerning complaints except for slightly more cough as well as aching pain in the neck area.  She is using ibuprofen 2 tablets on daily basis.  She has not been using tramadol regularly.  She denied having any chest pain, shortness of breath or hemoptysis.  She denied having any recent weight loss or night sweats.  She has no nausea, vomiting, diarrhea or constipation.  She has no recent headache or visual changes.  The patient has a repeat CT scan of the neck, chest, abdomen and pelvis performed recently and she is here for evaluation and discussion of her scan results.   MEDICAL HISTORY:No past medical history on file.  ALLERGIES:  has No Known Allergies.  MEDICATIONS:  Current Outpatient Medications  Medication Sig Dispense Refill  . acetaminophen (TYLENOL) 500 MG tablet Take 1,000 mg by mouth every 6 (six)  hours as needed for mild pain.     Marland Kitchen alendronate (FOSAMAX) 70 MG tablet Take 70 mg by mouth once a week. Wednesday  3  . butalbital-aspirin-caffeine (FIORINAL) 50-325-40 MG capsule Take 1 capsule by mouth every 6 (six) hours as needed for migraine.   1  . Calcium Carbonate-Vitamin D (CALCIUM-D PO) Take 1 tablet by mouth 2 (two) times daily.    . folic acid (FOLVITE) 1 MG tablet Take 1 tablet (1 mg total) by mouth daily. 90 tablet 1  . ibuprofen (ADVIL,MOTRIN) 200 MG tablet Take 600 mg by mouth every 6 (six) hours as needed for mild pain.     Marland Kitchen lidocaine-prilocaine (EMLA) cream Apply 1 application topically as needed. 30 g 0  . LUTEIN PO Take 1 tablet by mouth daily.    Marland Kitchen omeprazole (PRILOSEC) 20 MG capsule Take 1 capsule (20 mg total) by mouth daily. 90 capsule 1  . ondansetron (ZOFRAN) 8 MG tablet Take 1 tablet (8 mg total) by mouth every 8 (eight) hours as needed for nausea or vomiting. (Patient not taking: Reported on 07/04/2018) 20 tablet 0  . prochlorperazine (COMPAZINE) 10 MG tablet Take 1 tablet (10 mg total) by mouth every 6 (six) hours as needed for nausea or vomiting. (Patient not taking: Reported on 07/04/2018) 30 tablet 0  . traMADol (ULTRAM) 50 MG tablet Take 1 tablet (50 mg total) by mouth every 12 (twelve) hours as needed for moderate pain. 60 tablet 0  . zolpidem (AMBIEN) 10 MG tablet Take 1 tablet (10 mg total) by mouth at bedtime  as needed for sleep. 30 tablet 0   No current facility-administered medications for this visit.     SURGICAL HISTORY:  Past Surgical History:  Procedure Laterality Date  . IR IMAGING GUIDED PORT INSERTION  06/26/2018    REVIEW OF SYSTEMS:  Constitutional: negative Eyes: negative Ears, nose, mouth, throat, and face: negative Respiratory: positive for cough Cardiovascular: negative Gastrointestinal: negative Genitourinary:negative Integument/breast: negative Hematologic/lymphatic: negative Musculoskeletal:positive for neck pain Neurological:  negative Behavioral/Psych: negative Endocrine: negative Allergic/Immunologic: negative   PHYSICAL EXAMINATION: General appearance: alert, cooperative, fatigued and no distress Head: Normocephalic, without obvious abnormality, atraumatic Neck: no adenopathy, no JVD, supple, symmetrical, trachea midline and thyroid not enlarged, symmetric, no tenderness/mass/nodules Lymph nodes: Cervical, supraclavicular, and axillary nodes normal. Resp: clear to auscultation bilaterally Back: symmetric, no curvature. ROM normal. No CVA tenderness. Cardio: regular rate and rhythm, S1, S2 normal, no murmur, click, rub or gallop GI: soft, non-tender; bowel sounds normal; no masses,  no organomegaly Extremities: extremities normal, atraumatic, no cyanosis or edema Neurologic: Alert and oriented X 3, normal strength and tone. Normal symmetric reflexes. Normal coordination and gait  ECOG PERFORMANCE STATUS: 1 - Symptomatic but completely ambulatory  Blood pressure 113/66, pulse (!) 118, temperature 99.6 F (37.6 C), temperature source Oral, resp. rate 18, height 5\' 7"  (1.702 m), weight 122 lb (55.3 kg), SpO2 100 %.  LABORATORY DATA: Lab Results  Component Value Date   WBC 12.2 (H) 10/18/2018   HGB 9.3 (L) 10/18/2018   HCT 30.5 (L) 10/18/2018   MCV 86.6 10/18/2018   PLT 518 (H) 10/18/2018      Chemistry      Component Value Date/Time   NA 138 09/27/2018 1024   K 4.1 09/27/2018 1024   CL 103 09/27/2018 1024   CO2 27 09/27/2018 1024   BUN 11 09/27/2018 1024   CREATININE 0.72 09/27/2018 1024      Component Value Date/Time   CALCIUM 9.4 09/27/2018 1024   ALKPHOS 95 09/27/2018 1024   AST 40 09/27/2018 1024   ALT 38 09/27/2018 1024   BILITOT <0.2 (L) 09/27/2018 1024       RADIOGRAPHIC STUDIES: Ct Soft Tissue Neck W Contrast  Result Date: 10/16/2018 CLINICAL DATA:  History of right-sided lung cancer. Chemotherapy, immunotherapy and radiation therapy. Cervical spinal metastatic disease. Right  suboccipital scalp nodule. EXAM: CT NECK WITH CONTRAST TECHNIQUE: Multidetector CT imaging of the neck was performed using the standard protocol following the bolus administration of intravenous contrast. CONTRAST:  114mL OMNIPAQUE IOHEXOL 300 MG/ML  SOLN COMPARISON:  08/02/2018 FINDINGS: Pharynx and larynx: No mucosal or submucosal lesion is seen. Salivary glands: Parotid and submandibular glands are normal. Thyroid: Normal Lymph nodes: No enlarged or low-density cervical region lymph nodes. Vascular: Normal Limited intracranial: Normal Visualized orbits: Normal Mastoids and visualized paranasal sinuses: Clear Skeleton: Treated metastatic lesions affecting the posterior elements/spinous processes of C2 and C3 appears stable. No progressive finding. No new lesion elsewhere in the cervical spine. Upper chest: See results of chest CT. Other: Small density in the suboccipital fat to the right of midline at the location of a previously treated nodule. No evidence of residual or recurrent tumor in that location. IMPRESSION: Chronic destructive changes of the spinous processes of C2 and C3 related to metastatic disease at that level. No evidence of progressive disease. Small subcutaneous density in the right suboccipital fat related to a previously treated nodule in that location. Electronically Signed   By: Nelson Chimes M.D.   On: 10/16/2018 10:18   Ct  Chest W Contrast  Result Date: 10/16/2018 CLINICAL DATA:  Non-small cell lung cancer restaging, chemotherapy and immunotherapy in progress. EXAM: CT CHEST, ABDOMEN, AND PELVIS WITH CONTRAST TECHNIQUE: Multidetector CT imaging of the chest, abdomen and pelvis was performed following the standard protocol during bolus administration of intravenous contrast. CONTRAST:  153mL OMNIPAQUE IOHEXOL 300 MG/ML  SOLN COMPARISON:  08/02/2018 FINDINGS: CT CHEST FINDINGS Cardiovascular: Right Port-A-Cath tip: Right atrium. Atherosclerotic calcification of the aortic arch.  Mediastinum/Nodes: Gas-filled and borderline dilated upper thoracic esophagus. No pathologic adenopathy. Lungs/Pleura: Increasing right upper lobe consolidation and volume loss. There is greater volume loss in the superior segment right lower lobe although the perihilar and infrahilar peribronchovascular airspace in this region is more compact than on the prior exam, and with less of a periphery of ground-glass opacity. Although the right upper lobe mass is difficult to measure due to the surrounding airspace opacity, size is estimated at 3.9 by 1.9 cm, formerly 4.0 by 2.3 cm, a slight reduction in overall size. Trace right pleural effusion, nonspecific for transudative or exudative etiology. New bandlike density medially in the left upper lobe potentially from atelectasis or possibly radiation pneumonitis. Musculoskeletal: Stable mildly displaced fracture the right fourth rib laterally. Healing fracture of the right seventh rib laterally. Faint sclerosis anteriorly in the T10 vertebral body, about 1.4 cm craniocaudad, similar to 08/02/18 but new compared to 03/18/2018, and therefore concerning for possible malignancy. Increased sclerosis of the right fifth rib along the anterior costochondral junction, image 50/2. CT ABDOMEN PELVIS FINDINGS Hepatobiliary: Unremarkable Pancreas: Unremarkable Spleen: Unremarkable Adrenals/Urinary Tract: Increased size of the left adrenal mass currently 3.0 by 2.2 cm, formerly 1.6 by 1.0 cm. Increased heterogeneity of the mass with some suspected internal necrosis. The kidneys appear unremarkable. Right adrenal gland normal. Stomach/Bowel: Unremarkable Vascular/Lymphatic: 1.1 cm left eccentric periaortic lymph node on image 62/2, formerly 0.9 cm. Additional small periaortic lymph nodes are present but are not pathologically enlarged. Reproductive: Unremarkable.  Incidental retroverted uterus. Other: No supplemental non-categorized findings. Musculoskeletal: 95% compression fracture at  L2 similar to prior. Associated vertebral sclerosis noted without a significant degree of posterior bony retropulsion. Stable 8 mm sclerotic lesion in the left posterior acetabulum. Stable 2.0 cm lytic lesion of the right ischium. 2.0 cm sclerotic lesion of the right sacral ala, no change from prior. IMPRESSION: 1. Mixed appearance with reduced size of the right upper lobe mass, but with significant increase in size of the left adrenal metastatic lesion. 2. A 1.4 cm mildly sclerotic lesion in the T10 vertebral body anteriorly is stable from the most recent prior exam, but new from July 2019, and therefore suspicious for a metastatic lesion. 3. Stable prominent compression fracture at L2. 4. Gas-filled in borderline dilated upper thoracic esophagus. 5. There is some increase in right upper lobe consolidation and volume loss much of which may be from radiation therapy. Increased volume loss in the superior segment right lower lobe although the overall area of involvement in the superior segment is reduced. 6. Stable mildly displaced fracture of the right fourth rib laterally. Healing fracture of the right lateral seventh rib. 7. Mildly enlarged left periaortic lymph node, 1.1 cm today and previously 0.9 cm. 8. Increased sclerosis along the anterior right fifth rib margin, possibly from nondisplaced fracture or a small metastatic lesion. Electronically Signed   By: Van Clines M.D.   On: 10/16/2018 13:26   Ct Abdomen Pelvis W Contrast  Result Date: 10/16/2018 CLINICAL DATA:  Non-small cell lung cancer restaging, chemotherapy and  immunotherapy in progress. EXAM: CT CHEST, ABDOMEN, AND PELVIS WITH CONTRAST TECHNIQUE: Multidetector CT imaging of the chest, abdomen and pelvis was performed following the standard protocol during bolus administration of intravenous contrast. CONTRAST:  144mL OMNIPAQUE IOHEXOL 300 MG/ML  SOLN COMPARISON:  08/02/2018 FINDINGS: CT CHEST FINDINGS Cardiovascular: Right Port-A-Cath tip:  Right atrium. Atherosclerotic calcification of the aortic arch. Mediastinum/Nodes: Gas-filled and borderline dilated upper thoracic esophagus. No pathologic adenopathy. Lungs/Pleura: Increasing right upper lobe consolidation and volume loss. There is greater volume loss in the superior segment right lower lobe although the perihilar and infrahilar peribronchovascular airspace in this region is more compact than on the prior exam, and with less of a periphery of ground-glass opacity. Although the right upper lobe mass is difficult to measure due to the surrounding airspace opacity, size is estimated at 3.9 by 1.9 cm, formerly 4.0 by 2.3 cm, a slight reduction in overall size. Trace right pleural effusion, nonspecific for transudative or exudative etiology. New bandlike density medially in the left upper lobe potentially from atelectasis or possibly radiation pneumonitis. Musculoskeletal: Stable mildly displaced fracture the right fourth rib laterally. Healing fracture of the right seventh rib laterally. Faint sclerosis anteriorly in the T10 vertebral body, about 1.4 cm craniocaudad, similar to 08/02/18 but new compared to 03/18/2018, and therefore concerning for possible malignancy. Increased sclerosis of the right fifth rib along the anterior costochondral junction, image 50/2. CT ABDOMEN PELVIS FINDINGS Hepatobiliary: Unremarkable Pancreas: Unremarkable Spleen: Unremarkable Adrenals/Urinary Tract: Increased size of the left adrenal mass currently 3.0 by 2.2 cm, formerly 1.6 by 1.0 cm. Increased heterogeneity of the mass with some suspected internal necrosis. The kidneys appear unremarkable. Right adrenal gland normal. Stomach/Bowel: Unremarkable Vascular/Lymphatic: 1.1 cm left eccentric periaortic lymph node on image 62/2, formerly 0.9 cm. Additional small periaortic lymph nodes are present but are not pathologically enlarged. Reproductive: Unremarkable.  Incidental retroverted uterus. Other: No supplemental  non-categorized findings. Musculoskeletal: 95% compression fracture at L2 similar to prior. Associated vertebral sclerosis noted without a significant degree of posterior bony retropulsion. Stable 8 mm sclerotic lesion in the left posterior acetabulum. Stable 2.0 cm lytic lesion of the right ischium. 2.0 cm sclerotic lesion of the right sacral ala, no change from prior. IMPRESSION: 1. Mixed appearance with reduced size of the right upper lobe mass, but with significant increase in size of the left adrenal metastatic lesion. 2. A 1.4 cm mildly sclerotic lesion in the T10 vertebral body anteriorly is stable from the most recent prior exam, but new from July 2019, and therefore suspicious for a metastatic lesion. 3. Stable prominent compression fracture at L2. 4. Gas-filled in borderline dilated upper thoracic esophagus. 5. There is some increase in right upper lobe consolidation and volume loss much of which may be from radiation therapy. Increased volume loss in the superior segment right lower lobe although the overall area of involvement in the superior segment is reduced. 6. Stable mildly displaced fracture of the right fourth rib laterally. Healing fracture of the right lateral seventh rib. 7. Mildly enlarged left periaortic lymph node, 1.1 cm today and previously 0.9 cm. 8. Increased sclerosis along the anterior right fifth rib margin, possibly from nondisplaced fracture or a small metastatic lesion. Electronically Signed   By: Van Clines M.D.   On: 10/16/2018 13:26    ASSESSMENT AND PLAN: This is a very pleasant 63 years old white female with metastatic poorly differentiated malignant neoplasm of unknown primary at this point but according to the pathologist at the cancer center it  is likely poorly differentiated non-small cell carcinoma of lung primary. 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 PDL 1 expression was 99%. The molecular studies by [facility]  as well as [employer] showed no actionable mutations. The patient was a started on treatment with systemic chemotherapy with carboplatin for AUC of 5, Alimta 500 mg/M2 and Keytruda 200 mg IV every 3 weeks.  She is status post 4 cycles with partial response.  The patient was started on maintenance treatment with Alimta and Keytruda status post 5 cycles. The patient has been tolerating this treatment well with no concerning adverse effects. She had repeat CT scan of the neck, chest, abdomen and pelvis performed recently. I personally and independently reviewed the scan images and discussed the results with the patient and her husband. Her scan showed a stable disease except for enlargement left adrenal metastasis.  The left periaortic lymph node has slightly increased in size by 2 mm. I discussed with the patient and her husband her treatment options at this point.  I recommended for the patient to continue her current treatment with maintenance Alimta and Keytruda but we will consider palliative radiotherapy to the enlarging left adrenal gland metastasis and probably in the periaortic lymph node as well.  I referred the patient to Dr. Tammi Klippel for evaluation and discussion of this palliative radiation. If the patient developed more disease progression in the future, I may consider changing her treatment to a different regimen but we will continue to monitor her closely for now on the current treatment.  The patient and her husband were in agreement with the current plan.  She will proceed with cycle #6 today. She will come back for follow-up visit in 3 weeks for evaluation before starting cycle #7 of her treatment with maintenance Alimta and Keytruda. For pain management, I advised the patient to hold her treatment with ibuprofen few days before and few days after her chemotherapy. She was advised to call immediately if she has any other concerning symptoms in the interval. The patient voices  understanding of current disease status and treatment options and is in agreement with the current care plan.  All questions were answered. The patient knows to call the clinic with any problems, questions or concerns. We can certainly see the patient much sooner if necessary.  Disclaimer: This note was dictated with voice recognition software. Similar sounding words can inadvertently be transcribed and may not be corrected upon review.

## 2018-10-18 NOTE — Progress Notes (Signed)
Pulse 104-per Dr Julien Nordmann it is okay to treat pt today with Carboplatin,alimta and keytruda and pulse 104.

## 2018-10-18 NOTE — Progress Notes (Signed)
Per Dr. Julien Nordmann, patient is OK to treat with today's vital signs

## 2018-10-29 MED FILL — FOLIC ACID 1 MG TABS: 1 | 30 days supply | Qty: 30 | Fill #2

## 2018-10-30 ENCOUNTER — Other Ambulatory Visit: Payer: Self-pay

## 2018-10-30 ENCOUNTER — Encounter: Payer: Self-pay | Admitting: Urology

## 2018-10-30 ENCOUNTER — Ambulatory Visit
Admission: RE | Admit: 2018-10-30 | Discharge: 2018-10-30 | Disposition: A | Discharge status: Home / Self Care | Payer: 59 | Source: Ambulatory Visit | Attending: Urology | Admitting: Urology

## 2018-10-30 ENCOUNTER — Ambulatory Visit
Admission: RE | Admit: 2018-10-30 | Discharge: 2018-10-30 | Disposition: A | Discharge status: Home / Self Care | Payer: 59 | Source: Ambulatory Visit | Attending: Radiation Oncology | Admitting: Radiation Oncology

## 2018-10-30 ENCOUNTER — Inpatient Hospital Stay (HOSPITAL_BASED_OUTPATIENT_CLINIC_OR_DEPARTMENT_OTHER): Payer: 59 | Admitting: Medical

## 2018-10-30 VITALS — BP 134/65 | HR 120 | Temp 99.6°F | Resp 18 | Ht 67.0 in | Wt 121.4 lb

## 2018-10-30 VITALS — BP 114/62 | HR 97 | Temp 98.9°F | Resp 17

## 2018-10-30 DIAGNOSIS — C7972 Secondary malignant neoplasm of left adrenal gland: Secondary | ICD-10-CM | POA: Diagnosis not present

## 2018-10-30 DIAGNOSIS — Z923 Personal history of irradiation: Secondary | ICD-10-CM | POA: Diagnosis not present

## 2018-10-30 DIAGNOSIS — Z5112 Encounter for antineoplastic immunotherapy: Secondary | ICD-10-CM | POA: Diagnosis not present

## 2018-10-30 DIAGNOSIS — G893 Neoplasm related pain (acute) (chronic): Secondary | ICD-10-CM | POA: Diagnosis not present

## 2018-10-30 DIAGNOSIS — R05 Cough: Secondary | ICD-10-CM

## 2018-10-30 DIAGNOSIS — R059 Cough, unspecified: Secondary | ICD-10-CM

## 2018-10-30 DIAGNOSIS — C772 Secondary and unspecified malignant neoplasm of intra-abdominal lymph nodes: Secondary | ICD-10-CM | POA: Diagnosis not present

## 2018-10-30 DIAGNOSIS — M25552 Pain in left hip: Secondary | ICD-10-CM | POA: Diagnosis not present

## 2018-10-30 DIAGNOSIS — C3411 Malignant neoplasm of upper lobe, right bronchus or lung: Secondary | ICD-10-CM | POA: Diagnosis not present

## 2018-10-30 DIAGNOSIS — Z79899 Other long term (current) drug therapy: Secondary | ICD-10-CM | POA: Insufficient documentation

## 2018-10-30 DIAGNOSIS — C7951 Secondary malignant neoplasm of bone: Secondary | ICD-10-CM | POA: Insufficient documentation

## 2018-10-30 DIAGNOSIS — M25551 Pain in right hip: Secondary | ICD-10-CM | POA: Diagnosis not present

## 2018-10-30 DIAGNOSIS — C7800 Secondary malignant neoplasm of unspecified lung: Secondary | ICD-10-CM | POA: Diagnosis not present

## 2018-10-30 DIAGNOSIS — J90 Pleural effusion, not elsewhere classified: Secondary | ICD-10-CM | POA: Diagnosis not present

## 2018-10-30 DIAGNOSIS — Z85830 Personal history of malignant neoplasm of bone: Secondary | ICD-10-CM | POA: Diagnosis not present

## 2018-10-30 DIAGNOSIS — C801 Malignant (primary) neoplasm, unspecified: Secondary | ICD-10-CM

## 2018-10-30 DIAGNOSIS — C779 Secondary and unspecified malignant neoplasm of lymph node, unspecified: Secondary | ICD-10-CM | POA: Diagnosis not present

## 2018-10-30 DIAGNOSIS — D72829 Elevated white blood cell count, unspecified: Secondary | ICD-10-CM | POA: Diagnosis not present

## 2018-10-30 NOTE — Progress Notes (Signed)
Radiation Oncology         [phone] ________________________________  Name: Rebecca Lucas MRN: [id number]  Date: 10/30/2018  DOB: 10-14-1955  Re-Consult Note  CC: Donald Prose, MD  Curt Bears, MD  Diagnosis:   63 y.o. 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  Interval Since Last Radiation:  6 months  03/29/18 - 04/20/18 : 1. 30 Gy directed to the RIGHT hip delivered in 10 fractions 2. 30 Gy directed to the RIGHT lung delivered in 10 fractions  SBRT 03/21/2018, 03/23/2018, 03/26/2018, 03/28/2018, 03/30/2018:   The cervical spine (C2-3) was treated to 50 Gy in 5 fractions of 10 Gy.  Narrative:  The patient returns today for re-consultation at the request of Dr. Julien Nordmann to discuss the potential for palliative radiotherapy to progressive metastases in the left adrenal gland and a left periaortic node.  In summary, she was initially diagnosed with metastatic, poorly differentiated neoplasm, questionable for early NSCLC, presenting with a large right upper lobe lung mass and osseous metastases.  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  She elected to proceed with palliative radiation to the cervical spine, right hip and right lung which was completed in July 2019 and tolerated well.  She did experience significant improvement in her neck and hip pain after completion of radiotherapy.  She is status post 4 cycles of systemic therapy with carboplatin/Alimta/Keytruda under the care and direction of Dr. Earlie Server with a partial response and was transitioned to maintenance treatment with Alimta/Keytruda on 07/04/18, now status post 6 cycles.  She is continued to tolerate treatment well.  She underwent follow up CT scans on 10/16/2018 for disease restaging which showed a mixed response.  Her neck CT showed no evidence of progressive  disease but CT C/A/P however, revealed significant increase in size of the left adrenal metastatic lesion, measuring 3cm as compared to 1.6 cm in 07/2018 and a 47mm increase in size of a left periaortic node, now measuring 1.1 cm.  The previously treated right upper lobe mass had decreased in size.  She has kindly been referred today for consideration of palliative radiotherapy to the enlarging left adrenal gland metastasis and left periaortic lymph node.  On review of systems, the patient states that she is doing okay overall. She reports pain in her hips bilaterally, which is worse on the left side, at a 2/10. The pain is managed with 2 ibuprofen daily.  She specifically denies chest pain, increased shortness of breath, hemoptysis, productive cough, fever, chills or night sweats.  She reports a decent appetite and is maintaining her weight.  She denies abdominal pain, nausea, vomiting, diarrhea or constipation.  She has not had recent headaches, changes in visual or auditory acuity, dizziness, imbalance or tremor.   ALLERGIES:  has No Known Allergies.  Meds: Current Outpatient Medications  Medication Sig Dispense Refill  . acetaminophen (TYLENOL) 500 MG tablet Take 1,000 mg by mouth every 6 (six) hours as needed for mild pain.     Marland Kitchen alendronate (FOSAMAX) 70 MG tablet Take 70 mg by mouth once a week. Wednesday  3  . butalbital-aspirin-caffeine (FIORINAL) 50-325-40 MG capsule Take 1 capsule by mouth every 6 (six) hours as needed for migraine.   1  . Calcium Carbonate-Vitamin D (CALCIUM-D PO) Take 1 tablet by mouth 2 (two) times daily.    . folic  acid (FOLVITE) 1 MG tablet Take 1 tablet (1 mg total) by mouth daily. 90 tablet 1  . ibuprofen (ADVIL,MOTRIN) 200 MG tablet Take 600 mg by mouth every 6 (six) hours as needed for mild pain.     Marland Kitchen lidocaine-prilocaine (EMLA) cream Apply 1 application topically as needed. 30 g 0  . LUTEIN PO Take 1 tablet by mouth daily.    Marland Kitchen omeprazole (PRILOSEC) 20 MG  capsule Take 1 capsule (20 mg total) by mouth daily. 90 capsule 1  . ondansetron (ZOFRAN) 8 MG tablet Take 1 tablet (8 mg total) by mouth every 8 (eight) hours as needed for nausea or vomiting. 20 tablet 0  . traMADol (ULTRAM) 50 MG tablet Take 1 tablet (50 mg total) by mouth every 12 (twelve) hours as needed for moderate pain. 60 tablet 0  . zolpidem (AMBIEN) 10 MG tablet Take 1 tablet (10 mg total) by mouth at bedtime as needed for sleep. 30 tablet 0   No current facility-administered medications for this encounter.     Physical Findings:  height is 5\' 7"  (1.702 m) and weight is 121 lb 6 oz (55.1 kg). Her oral temperature is 99.6 F (37.6 C). Her blood pressure is 134/65 and her pulse is 120 (abnormal). Her respiration is 18 and oxygen saturation is 100%.  Pain Assessment Pain Score: 2  Pain Loc: Hip(Both Hips but mostly on the left side)/10 In general this is a well appearing Caucasian female in no acute distress.  She's alert and oriented x4 and appropriate throughout the examination. Cardiopulmonary assessment is negative for acute distress and she exhibits normal effort.  The abdomen is soft and nontender to palpation.  There are no palpable lymph nodes in the cervical or supraclavicular chains.  The lower extremities are without pitting edema, deep calf tenderness, cyanosis or clubbing.   Lab Findings: Lab Results  Component Value Date   WBC 12.2 (H) 10/18/2018   HGB 9.3 (L) 10/18/2018   HCT 30.5 (L) 10/18/2018   MCV 86.6 10/18/2018   PLT 518 (H) 10/18/2018     Radiographic Findings: Ct Soft Tissue Neck W Contrast  Result Date: 10/16/2018 CLINICAL DATA:  History of right-sided lung cancer. Chemotherapy, immunotherapy and radiation therapy. Cervical spinal metastatic disease. Right suboccipital scalp nodule. EXAM: CT NECK WITH CONTRAST TECHNIQUE: Multidetector CT imaging of the neck was performed using the standard protocol following the bolus administration of intravenous  contrast. CONTRAST:  14mL OMNIPAQUE IOHEXOL 300 MG/ML  SOLN COMPARISON:  08/02/2018 FINDINGS: Pharynx and larynx: No mucosal or submucosal lesion is seen. Salivary glands: Parotid and submandibular glands are normal. Thyroid: Normal Lymph nodes: No enlarged or low-density cervical region lymph nodes. Vascular: Normal Limited intracranial: Normal Visualized orbits: Normal Mastoids and visualized paranasal sinuses: Clear Skeleton: Treated metastatic lesions affecting the posterior elements/spinous processes of C2 and C3 appears stable. No progressive finding. No new lesion elsewhere in the cervical spine. Upper chest: See results of chest CT. Other: Small density in the suboccipital fat to the right of midline at the location of a previously treated nodule. No evidence of residual or recurrent tumor in that location. IMPRESSION: Chronic destructive changes of the spinous processes of C2 and C3 related to metastatic disease at that level. No evidence of progressive disease. Small subcutaneous density in the right suboccipital fat related to a previously treated nodule in that location. Electronically Signed   By: Nelson Chimes M.D.   On: 10/16/2018 10:18   Ct Chest W Contrast  Result Date:  10/16/2018 CLINICAL DATA:  Non-small cell lung cancer restaging, chemotherapy and immunotherapy in progress. EXAM: CT CHEST, ABDOMEN, AND PELVIS WITH CONTRAST TECHNIQUE: Multidetector CT imaging of the chest, abdomen and pelvis was performed following the standard protocol during bolus administration of intravenous contrast. CONTRAST:  18mL OMNIPAQUE IOHEXOL 300 MG/ML  SOLN COMPARISON:  08/02/2018 FINDINGS: CT CHEST FINDINGS Cardiovascular: Right Port-A-Cath tip: Right atrium. Atherosclerotic calcification of the aortic arch. Mediastinum/Nodes: Gas-filled and borderline dilated upper thoracic esophagus. No pathologic adenopathy. Lungs/Pleura: Increasing right upper lobe consolidation and volume loss. There is greater volume loss  in the superior segment right lower lobe although the perihilar and infrahilar peribronchovascular airspace in this region is more compact than on the prior exam, and with less of a periphery of ground-glass opacity. Although the right upper lobe mass is difficult to measure due to the surrounding airspace opacity, size is estimated at 3.9 by 1.9 cm, formerly 4.0 by 2.3 cm, a slight reduction in overall size. Trace right pleural effusion, nonspecific for transudative or exudative etiology. New bandlike density medially in the left upper lobe potentially from atelectasis or possibly radiation pneumonitis. Musculoskeletal: Stable mildly displaced fracture the right fourth rib laterally. Healing fracture of the right seventh rib laterally. Faint sclerosis anteriorly in the T10 vertebral body, about 1.4 cm craniocaudad, similar to 08/02/18 but new compared to 03/18/2018, and therefore concerning for possible malignancy. Increased sclerosis of the right fifth rib along the anterior costochondral junction, image 50/2. CT ABDOMEN PELVIS FINDINGS Hepatobiliary: Unremarkable Pancreas: Unremarkable Spleen: Unremarkable Adrenals/Urinary Tract: Increased size of the left adrenal mass currently 3.0 by 2.2 cm, formerly 1.6 by 1.0 cm. Increased heterogeneity of the mass with some suspected internal necrosis. The kidneys appear unremarkable. Right adrenal gland normal. Stomach/Bowel: Unremarkable Vascular/Lymphatic: 1.1 cm left eccentric periaortic lymph node on image 62/2, formerly 0.9 cm. Additional small periaortic lymph nodes are present but are not pathologically enlarged. Reproductive: Unremarkable.  Incidental retroverted uterus. Other: No supplemental non-categorized findings. Musculoskeletal: 95% compression fracture at L2 similar to prior. Associated vertebral sclerosis noted without a significant degree of posterior bony retropulsion. Stable 8 mm sclerotic lesion in the left posterior acetabulum. Stable 2.0 cm lytic  lesion of the right ischium. 2.0 cm sclerotic lesion of the right sacral ala, no change from prior. IMPRESSION: 1. Mixed appearance with reduced size of the right upper lobe mass, but with significant increase in size of the left adrenal metastatic lesion. 2. A 1.4 cm mildly sclerotic lesion in the T10 vertebral body anteriorly is stable from the most recent prior exam, but new from July 2019, and therefore suspicious for a metastatic lesion. 3. Stable prominent compression fracture at L2. 4. Gas-filled in borderline dilated upper thoracic esophagus. 5. There is some increase in right upper lobe consolidation and volume loss much of which may be from radiation therapy. Increased volume loss in the superior segment right lower lobe although the overall area of involvement in the superior segment is reduced. 6. Stable mildly displaced fracture of the right fourth rib laterally. Healing fracture of the right lateral seventh rib. 7. Mildly enlarged left periaortic lymph node, 1.1 cm today and previously 0.9 cm. 8. Increased sclerosis along the anterior right fifth rib margin, possibly from nondisplaced fracture or a small metastatic lesion. Electronically Signed   By: Van Clines M.D.   On: 10/16/2018 13:26   Ct Abdomen Pelvis W Contrast  Result Date: 10/16/2018 CLINICAL DATA:  Non-small cell lung cancer restaging, chemotherapy and immunotherapy in progress. EXAM: CT CHEST,  ABDOMEN, AND PELVIS WITH CONTRAST TECHNIQUE: Multidetector CT imaging of the chest, abdomen and pelvis was performed following the standard protocol during bolus administration of intravenous contrast. CONTRAST:  1103mL OMNIPAQUE IOHEXOL 300 MG/ML  SOLN COMPARISON:  08/02/2018 FINDINGS: CT CHEST FINDINGS Cardiovascular: Right Port-A-Cath tip: Right atrium. Atherosclerotic calcification of the aortic arch. Mediastinum/Nodes: Gas-filled and borderline dilated upper thoracic esophagus. No pathologic adenopathy. Lungs/Pleura: Increasing right  upper lobe consolidation and volume loss. There is greater volume loss in the superior segment right lower lobe although the perihilar and infrahilar peribronchovascular airspace in this region is more compact than on the prior exam, and with less of a periphery of ground-glass opacity. Although the right upper lobe mass is difficult to measure due to the surrounding airspace opacity, size is estimated at 3.9 by 1.9 cm, formerly 4.0 by 2.3 cm, a slight reduction in overall size. Trace right pleural effusion, nonspecific for transudative or exudative etiology. New bandlike density medially in the left upper lobe potentially from atelectasis or possibly radiation pneumonitis. Musculoskeletal: Stable mildly displaced fracture the right fourth rib laterally. Healing fracture of the right seventh rib laterally. Faint sclerosis anteriorly in the T10 vertebral body, about 1.4 cm craniocaudad, similar to 08/02/18 but new compared to 03/18/2018, and therefore concerning for possible malignancy. Increased sclerosis of the right fifth rib along the anterior costochondral junction, image 50/2. CT ABDOMEN PELVIS FINDINGS Hepatobiliary: Unremarkable Pancreas: Unremarkable Spleen: Unremarkable Adrenals/Urinary Tract: Increased size of the left adrenal mass currently 3.0 by 2.2 cm, formerly 1.6 by 1.0 cm. Increased heterogeneity of the mass with some suspected internal necrosis. The kidneys appear unremarkable. Right adrenal gland normal. Stomach/Bowel: Unremarkable Vascular/Lymphatic: 1.1 cm left eccentric periaortic lymph node on image 62/2, formerly 0.9 cm. Additional small periaortic lymph nodes are present but are not pathologically enlarged. Reproductive: Unremarkable.  Incidental retroverted uterus. Other: No supplemental non-categorized findings. Musculoskeletal: 95% compression fracture at L2 similar to prior. Associated vertebral sclerosis noted without a significant degree of posterior bony retropulsion. Stable 8 mm  sclerotic lesion in the left posterior acetabulum. Stable 2.0 cm lytic lesion of the right ischium. 2.0 cm sclerotic lesion of the right sacral ala, no change from prior. IMPRESSION: 1. Mixed appearance with reduced size of the right upper lobe mass, but with significant increase in size of the left adrenal metastatic lesion. 2. A 1.4 cm mildly sclerotic lesion in the T10 vertebral body anteriorly is stable from the most recent prior exam, but new from July 2019, and therefore suspicious for a metastatic lesion. 3. Stable prominent compression fracture at L2. 4. Gas-filled in borderline dilated upper thoracic esophagus. 5. There is some increase in right upper lobe consolidation and volume loss much of which may be from radiation therapy. Increased volume loss in the superior segment right lower lobe although the overall area of involvement in the superior segment is reduced. 6. Stable mildly displaced fracture of the right fourth rib laterally. Healing fracture of the right lateral seventh rib. 7. Mildly enlarged left periaortic lymph node, 1.1 cm today and previously 0.9 cm. 8. Increased sclerosis along the anterior right fifth rib margin, possibly from nondisplaced fracture or a small metastatic lesion. Electronically Signed   By: Van Clines M.D.   On: 10/16/2018 13:26    Impression/Plan: 1.  62 y.o. 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 Today, we talked to the patient about the findings and workup  thus far. We discussed the available radiation techniques, and focused on the details of logistics and delivery.  The recommendation is to proceed with a 5 fraction course of stereotactic body radiotherapy (SBRT) to the left adrenal and left periaortic node.  We reviewed the anticipated acute and late sequelae associated with radiation in this setting. The patient was  encouraged to ask questions that were answered to her stated satisfaction.  At the conclusion of our conversation, the patient elects to proceed with SBRT to the left adrenal metastasis and left periaortic nodal metastasis as recommended.  She has freely signed written consent to proceed today in the office and a copy of this document has been placed in her medical record.  She is scheduled for CT simulation at 9:30 AM on Thursday, 11/01/2018 in anticipation of beginning treatment on Wednesday, 11/07/2018.  We will share our findings and recommendations with Dr. Earlie Server with whom she will continue in routine follow up for systemic therapy.  She knows to call with any questions or concerns in the interim.    Nicholos Johns, PA-C    Tyler Pita, MD  [facility] Direct Dial: [phone]  Fax: [phone] .com  Skype  [email or web address]   This document serves as a record of services personally performed by Tyler Pita, MD and Freeman Caldron, PA-C. It was created on their behalf by Wilburn Mylar, a trained medical scribe. The creation of this record is based on the scribe's personal observations and the provider's statements to them. This document has been checked and approved by the attending provider.

## 2018-10-30 NOTE — Progress Notes (Signed)
Histology and Location of Primary Cancer:  Rt upper lung   Sites of Visceral and Bony Metastatic Disease:  Bone  Location(s) of Symptomatic Metastases: Bone    Pain on a scale of 0-10 is: 2 Patient states that she is having pain in both of her hips, left side greater than the right.    If Spine Met(s), symptoms, if any, include  Bowel/Bladder retention or incontinence (please describe): No  Numbness or weakness in extremities (please describe): No  Current Decadron regimen, if applicable: N/A  Ambulatory status? Walker? Wheelchair?: Patient ambulates on her own without any assistance.  SAFETY ISSUES:  Prior radiation?  Yes  Pacemaker/ICD? No  Possible current pregnancy? No  Is the patient on methotrexate? No  Current Complaints / other details: Patient states that she has a cold and a cough that is bothering her on today's visit.

## 2018-10-30 NOTE — Patient Instructions (Signed)

## 2018-11-01 ENCOUNTER — Ambulatory Visit
Admission: RE | Admit: 2018-11-01 | Discharge: 2018-11-01 | Disposition: A | Discharge status: Home / Self Care | Payer: 59 | Source: Ambulatory Visit | Attending: Radiation Oncology | Admitting: Radiation Oncology

## 2018-11-01 DIAGNOSIS — C801 Malignant (primary) neoplasm, unspecified: Secondary | ICD-10-CM | POA: Diagnosis not present

## 2018-11-01 DIAGNOSIS — C7972 Secondary malignant neoplasm of left adrenal gland: Secondary | ICD-10-CM | POA: Diagnosis not present

## 2018-11-01 DIAGNOSIS — Z510 Encounter for antineoplastic radiation therapy: Secondary | ICD-10-CM | POA: Diagnosis not present

## 2018-11-01 NOTE — Progress Notes (Signed)
[facility] Progress Note   [name] [id number] April 26, 1956 63 y.o.  Rebecca Lucas is managed by Dr. 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  She will see Dr. Julien Nordmann in follow up on 11/08/2018.  2) Cough:  The patient's exam was benign today.  She was reassured that her review of systems and exam did not indicate pneumonia.  No new medications were given.  She was told that she could increase her Prilosec to twice daily dosing in case some component of her cough was associated with GERD.   Please see After Visit Summary for patient specific instructions.  Future Appointments  Date Time Provider [place]  11/08/2018  9:30 AM [facility] ONLY [facility] None  11/08/2018  9:45 AM [facility] [place] FLUSH [facility] None  11/08/2018 10:15 AM Curt Bears, MD [facility] None  11/08/2018 11:00 AM [facility] INFUSION [facility] None  11/13/2018 12:50 PM Tyler Pita, MD [facility] None  11/15/2018  1:00 PM Tyler Pita, MD [facility] None  11/19/2018  1:00 PM Tyler Pita, MD [facility] None  11/21/2018  1:00 PM Tyler Pita, MD [facility] None  11/23/2018  1:00 PM Tyler Pita, MD [facility] None  11/29/2018  9:30 AM [facility] LAB 6 [facility] None  11/29/2018  9:45 AM [facility] [place] FLUSH [facility] None  11/29/2018 10:15 AM Curt Bears, MD [facility] None  11/29/2018 11:00 AM [facility] INFUSION [facility] None  12/20/2018  9:30 AM [facility] LAB 3 [facility] None  12/20/2018 10:00 AM Curt Bears, MD [facility] None  12/20/2018 11:00 AM  [facility] INFUSION [facility] None    No orders of the defined types were placed in this encounter.      Subjective:   Patient ID:  Rebecca Lucas is a 64 y.o. (DOB 03/30/1956) female.  Chief Complaint:  Chief Complaint  Patient presents with  . Cough    HPI Rebecca Lucas is a 63 y.o. female with a metastatic poorly differentiated neoplasm of the right lung with a right lung mass and right next and bone metastasis.  She is s/p Cycle 10 Day 1 of Alimta and Keytruda dosed on 10/18/2018.  She presents with an ongoing cough which she has had for several months.  She continues to take Claritin.  She has had pneumonia and is concerned that it could have recurred.  Her cough is non-productive.  She denies fever, chills, sweats, facial pain, or post nasal drainage.  Medications: I have reviewed the patient's current medications.  Allergies: No Known Allergies  No past medical history on file.  Past Surgical History:  Procedure Laterality Date  . IR IMAGING GUIDED PORT INSERTION  06/26/2018    Family History  Problem Relation Age of Onset  . ALS Mother 36       non genetic  . Cancer Neg Hx     Social History   Socioeconomic History  . Marital status: Married    Spouse name: Not on file  . Number of children: Not on file  . Years of education: Not on file  . Highest education level: Not on file  Occupational History  . Not on file  Social Needs  . Financial resource strain: Not on file  . Food insecurity:    Worry: Not on file    Inability: Not on file  . Transportation needs:    Medical: Not on file    Non-medical: Not on file  Tobacco Use  . Smoking status: Never Smoker  . Smokeless tobacco: Never Used  Substance and Sexual Activity  . Alcohol use: Yes    Comment: occasionally  . Drug use: Never  . Sexual activity: Yes  Lifestyle  . Physical activity:    Days per week: Not on file    Minutes per session: Not on file  . Stress: Not on file    Relationships  . Social connections:    Talks on phone: Not on file    Gets together: Not on file    Attends religious service: Not on file    Active member of club or organization: Not on file    Attends meetings of clubs or organizations: Not on file    Relationship status: Not on file  . Intimate partner violence:    Fear of current or ex partner: Not on file    Emotionally abused: Not on file    Physically abused: Not on file    Forced sexual activity: Not on file  Other Topics Concern  . Not on file  Social History Narrative   Married with two children. Her husband is Dr. Odis Lucas, a physician with occupational health. She has worked several jobs Energy manager, Glass blower/designer, and now the [employer].     Past Medical History, Surgical history, Social history, and Family history were reviewed and updated as appropriate.   Please see review of systems for further details on the patient's review from today.   Review of Systems:  Review of Systems  Constitutional: Negative for chills, diaphoresis, fatigue and fever.  HENT: Negative for congestion, postnasal drip, rhinorrhea and sore throat.   Respiratory: Positive for cough. Negative for shortness of breath and wheezing.   Cardiovascular: Negative for palpitations.  Neurological: Negative for headaches.    Objective:   Physical Exam:  BP 114/62 (BP Location: Left Arm, Patient Position: Sitting)   Pulse 97   Temp 98.9 F (37.2 C)   Resp 17   SpO2 100%  ECOG: 0  Physical Exam Constitutional:      General: She is not in acute distress.    Appearance: She is not diaphoretic.  HENT:     Head: Normocephalic and atraumatic.     Right Ear: External ear normal.     Left Ear: External ear normal.     Mouth/Throat:     Pharynx: No oropharyngeal exudate.  Neck:     Musculoskeletal: Normal range of motion and neck supple.  Cardiovascular:     Rate and Rhythm: Normal rate and regular rhythm.      Heart sounds: Normal heart sounds. No murmur. No friction rub. No gallop.   Pulmonary:     Effort: Pulmonary effort is normal. No respiratory distress.     Breath sounds: Normal breath sounds. No wheezing or rales.  Lymphadenopathy:     Head:     Right side of head: No submandibular, preauricular, posterior auricular or occipital adenopathy.     Left side of head: No submandibular, preauricular, posterior auricular or occipital adenopathy.     Cervical: No cervical adenopathy.     Right cervical: No superficial, deep or posterior cervical adenopathy.    Left  cervical: No superficial, deep or posterior cervical adenopathy.     Upper Body:     Right upper body: No supraclavicular, axillary or epitrochlear adenopathy.     Left upper body: No supraclavicular, axillary or epitrochlear adenopathy.  Skin:    General: Skin is warm and dry.     Findings: No erythema or rash.  Neurological:     Mental Status: She is alert.     Coordination: Coordination normal.  Psychiatric:        Behavior: Behavior normal.        Thought Content: Thought content normal.        Judgment: Judgment normal.     Lab Review:     Component Value Date/Time   NA 138 10/18/2018 1025   K 4.2 10/18/2018 1025   CL 102 10/18/2018 1025   CO2 27 10/18/2018 1025   GLUCOSE 125 (H) 10/18/2018 1025   BUN 10 10/18/2018 1025   CREATININE 0.75 10/18/2018 1025   CALCIUM 9.3 10/18/2018 1025   PROT 6.8 10/18/2018 1025   ALBUMIN 2.5 (L) 10/18/2018 1025   AST 38 10/18/2018 1025   ALT 32 10/18/2018 1025   ALKPHOS 98 10/18/2018 1025   BILITOT 0.4 10/18/2018 1025   GFRNONAA >60 10/18/2018 1025   GFRAA >60 10/18/2018 1025       Component Value Date/Time   WBC 12.2 (H) 10/18/2018 1025   WBC 3.3 (L) 06/26/2018 1012   RBC 3.52 (L) 10/18/2018 1025   HGB 9.3 (L) 10/18/2018 1025   HCT 30.5 (L) 10/18/2018 1025   PLT 518 (H) 10/18/2018 1025   MCV 86.6 10/18/2018 1025   MCH 26.4 10/18/2018 1025   MCHC 30.5 10/18/2018 1025     RDW 16.9 (H) 10/18/2018 1025   LYMPHSABS 0.5 (L) 10/18/2018 1025   MONOABS 0.9 10/18/2018 1025   EOSABS 0.3 10/18/2018 1025   BASOSABS 0.0 10/18/2018 1025   -------------------------------  Imaging from last 24 hours (if applicable):  Radiology interpretation: Ct Soft Tissue Neck W Contrast  Result Date: 10/16/2018 CLINICAL DATA:  History of right-sided lung cancer. Chemotherapy, immunotherapy and radiation therapy. Cervical spinal metastatic disease. Right suboccipital scalp nodule. EXAM: CT NECK WITH CONTRAST TECHNIQUE: Multidetector CT imaging of the neck was performed using the standard protocol following the bolus administration of intravenous contrast. CONTRAST:  1106mL OMNIPAQUE IOHEXOL 300 MG/ML  SOLN COMPARISON:  08/02/2018 FINDINGS: Pharynx and larynx: No mucosal or submucosal lesion is seen. Salivary glands: Parotid and submandibular glands are normal. Thyroid: Normal Lymph nodes: No enlarged or low-density cervical region lymph nodes. Vascular: Normal Limited intracranial: Normal Visualized orbits: Normal Mastoids and visualized paranasal sinuses: Clear Skeleton: Treated metastatic lesions affecting the posterior elements/spinous processes of C2 and C3 appears stable. No progressive finding. No new lesion elsewhere in the cervical spine. Upper chest: See results of chest CT. Other: Small density in the suboccipital fat to the right of midline at the location of a previously treated nodule. No evidence of residual or recurrent tumor in that location. IMPRESSION: Chronic destructive changes of the spinous processes of C2 and C3 related to metastatic disease at that level. No evidence of progressive disease. Small subcutaneous density in the right suboccipital fat related to a previously treated nodule in that location. Electronically Signed   By: Nelson Chimes M.D.   On: 10/16/2018 10:18   Ct Chest W Contrast  Result Date: 10/16/2018 CLINICAL DATA:  Non-small cell lung cancer restaging,  chemotherapy and immunotherapy in progress. EXAM: CT CHEST, ABDOMEN, AND  PELVIS WITH CONTRAST TECHNIQUE: Multidetector CT imaging of the chest, abdomen and pelvis was performed following the standard protocol during bolus administration of intravenous contrast. CONTRAST:  112mL OMNIPAQUE IOHEXOL 300 MG/ML  SOLN COMPARISON:  08/02/2018 FINDINGS: CT CHEST FINDINGS Cardiovascular: Right Port-A-Cath tip: Right atrium. Atherosclerotic calcification of the aortic arch. Mediastinum/Nodes: Gas-filled and borderline dilated upper thoracic esophagus. No pathologic adenopathy. Lungs/Pleura: Increasing right upper lobe consolidation and volume loss. There is greater volume loss in the superior segment right lower lobe although the perihilar and infrahilar peribronchovascular airspace in this region is more compact than on the prior exam, and with less of a periphery of ground-glass opacity. Although the right upper lobe mass is difficult to measure due to the surrounding airspace opacity, size is estimated at 3.9 by 1.9 cm, formerly 4.0 by 2.3 cm, a slight reduction in overall size. Trace right pleural effusion, nonspecific for transudative or exudative etiology. New bandlike density medially in the left upper lobe potentially from atelectasis or possibly radiation pneumonitis. Musculoskeletal: Stable mildly displaced fracture the right fourth rib laterally. Healing fracture of the right seventh rib laterally. Faint sclerosis anteriorly in the T10 vertebral body, about 1.4 cm craniocaudad, similar to 08/02/18 but new compared to 03/18/2018, and therefore concerning for possible malignancy. Increased sclerosis of the right fifth rib along the anterior costochondral junction, image 50/2. CT ABDOMEN PELVIS FINDINGS Hepatobiliary: Unremarkable Pancreas: Unremarkable Spleen: Unremarkable Adrenals/Urinary Tract: Increased size of the left adrenal mass currently 3.0 by 2.2 cm, formerly 1.6 by 1.0 cm. Increased heterogeneity of the  mass with some suspected internal necrosis. The kidneys appear unremarkable. Right adrenal gland normal. Stomach/Bowel: Unremarkable Vascular/Lymphatic: 1.1 cm left eccentric periaortic lymph node on image 62/2, formerly 0.9 cm. Additional small periaortic lymph nodes are present but are not pathologically enlarged. Reproductive: Unremarkable.  Incidental retroverted uterus. Other: No supplemental non-categorized findings. Musculoskeletal: 95% compression fracture at L2 similar to prior. Associated vertebral sclerosis noted without a significant degree of posterior bony retropulsion. Stable 8 mm sclerotic lesion in the left posterior acetabulum. Stable 2.0 cm lytic lesion of the right ischium. 2.0 cm sclerotic lesion of the right sacral ala, no change from prior. IMPRESSION: 1. Mixed appearance with reduced size of the right upper lobe mass, but with significant increase in size of the left adrenal metastatic lesion. 2. A 1.4 cm mildly sclerotic lesion in the T10 vertebral body anteriorly is stable from the most recent prior exam, but new from July 2019, and therefore suspicious for a metastatic lesion. 3. Stable prominent compression fracture at L2. 4. Gas-filled in borderline dilated upper thoracic esophagus. 5. There is some increase in right upper lobe consolidation and volume loss much of which may be from radiation therapy. Increased volume loss in the superior segment right lower lobe although the overall area of involvement in the superior segment is reduced. 6. Stable mildly displaced fracture of the right fourth rib laterally. Healing fracture of the right lateral seventh rib. 7. Mildly enlarged left periaortic lymph node, 1.1 cm today and previously 0.9 cm. 8. Increased sclerosis along the anterior right fifth rib margin, possibly from nondisplaced fracture or a small metastatic lesion. Electronically Signed   By: Van Clines M.D.   On: 10/16/2018 13:26   Ct Abdomen Pelvis W Contrast  Result  Date: 10/16/2018 CLINICAL DATA:  Non-small cell lung cancer restaging, chemotherapy and immunotherapy in progress. EXAM: CT CHEST, ABDOMEN, AND PELVIS WITH CONTRAST TECHNIQUE: Multidetector CT imaging of the chest, abdomen and pelvis was performed following the  standard protocol during bolus administration of intravenous contrast. CONTRAST:  163mL OMNIPAQUE IOHEXOL 300 MG/ML  SOLN COMPARISON:  08/02/2018 FINDINGS: CT CHEST FINDINGS Cardiovascular: Right Port-A-Cath tip: Right atrium. Atherosclerotic calcification of the aortic arch. Mediastinum/Nodes: Gas-filled and borderline dilated upper thoracic esophagus. No pathologic adenopathy. Lungs/Pleura: Increasing right upper lobe consolidation and volume loss. There is greater volume loss in the superior segment right lower lobe although the perihilar and infrahilar peribronchovascular airspace in this region is more compact than on the prior exam, and with less of a periphery of ground-glass opacity. Although the right upper lobe mass is difficult to measure due to the surrounding airspace opacity, size is estimated at 3.9 by 1.9 cm, formerly 4.0 by 2.3 cm, a slight reduction in overall size. Trace right pleural effusion, nonspecific for transudative or exudative etiology. New bandlike density medially in the left upper lobe potentially from atelectasis or possibly radiation pneumonitis. Musculoskeletal: Stable mildly displaced fracture the right fourth rib laterally. Healing fracture of the right seventh rib laterally. Faint sclerosis anteriorly in the T10 vertebral body, about 1.4 cm craniocaudad, similar to 08/02/18 but new compared to 03/18/2018, and therefore concerning for possible malignancy. Increased sclerosis of the right fifth rib along the anterior costochondral junction, image 50/2. CT ABDOMEN PELVIS FINDINGS Hepatobiliary: Unremarkable Pancreas: Unremarkable Spleen: Unremarkable Adrenals/Urinary Tract: Increased size of the left adrenal mass currently 3.0  by 2.2 cm, formerly 1.6 by 1.0 cm. Increased heterogeneity of the mass with some suspected internal necrosis. The kidneys appear unremarkable. Right adrenal gland normal. Stomach/Bowel: Unremarkable Vascular/Lymphatic: 1.1 cm left eccentric periaortic lymph node on image 62/2, formerly 0.9 cm. Additional small periaortic lymph nodes are present but are not pathologically enlarged. Reproductive: Unremarkable.  Incidental retroverted uterus. Other: No supplemental non-categorized findings. Musculoskeletal: 95% compression fracture at L2 similar to prior. Associated vertebral sclerosis noted without a significant degree of posterior bony retropulsion. Stable 8 mm sclerotic lesion in the left posterior acetabulum. Stable 2.0 cm lytic lesion of the right ischium. 2.0 cm sclerotic lesion of the right sacral ala, no change from prior. IMPRESSION: 1. Mixed appearance with reduced size of the right upper lobe mass, but with significant increase in size of the left adrenal metastatic lesion. 2. A 1.4 cm mildly sclerotic lesion in the T10 vertebral body anteriorly is stable from the most recent prior exam, but new from July 2019, and therefore suspicious for a metastatic lesion. 3. Stable prominent compression fracture at L2. 4. Gas-filled in borderline dilated upper thoracic esophagus. 5. There is some increase in right upper lobe consolidation and volume loss much of which may be from radiation therapy. Increased volume loss in the superior segment right lower lobe although the overall area of involvement in the superior segment is reduced. 6. Stable mildly displaced fracture of the right fourth rib laterally. Healing fracture of the right lateral seventh rib. 7. Mildly enlarged left periaortic lymph node, 1.1 cm today and previously 0.9 cm. 8. Increased sclerosis along the anterior right fifth rib margin, possibly from nondisplaced fracture or a small metastatic lesion. Electronically Signed   By: Van Clines M.D.    On: 10/16/2018 13:26        This case was discussed with Dr. Julien Nordmann. He expressed agreement with my management of this patient.

## 2018-11-02 NOTE — Progress Notes (Signed)
  Radiation Oncology         [phone] ________________________________  Name: Rebecca Lucas MRN: [id number]  Date: 11/01/2018  DOB: 13-Apr-1956  STEREOTACTIC BODY RADIOTHERAPY SIMULATION AND TREATMENT PLANNING NOTE    ICD-10-CM   1. 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  NARRATIVE:  The patient was brought to the [facility].  Identity was confirmed.  All relevant records and images related to the planned course of therapy were reviewed.  The patient freely provided informed written consent to proceed with treatment after reviewing the details related to the planned course of therapy. The consent form was witnessed and verified by the simulation staff.  Then, the patient was set-up in a stable reproducible  supine position for radiation therapy.  A BodyFix immobilization pillow was fabricated for reproducible positioning.  Surface markings were placed.  The CT images were loaded into the planning software.  The gross target volumes (GTV) and planning target volumes (PTV) were delinieated, and avoidance structures were contoured.  Treatment planning then occurred.  The radiation prescription was entered and confirmed.  A total of two complex treatment devices were fabricated in the form of the BodyFix immobilization pillow and a neck accuform cushion.  I have requested : 3D Simulation  I have requested a DVH of the following structures: targets and all normal structures near the target including stomach, left kidney, spinal cord and bowel as noted on the radiation plan to maintain doses in adherence with established limits  PLAN:  The patient will receive 50 Gy in 5 fractions.  ________________________________  Sheral Apley Tammi Klippel, M.D.  This  document serves as a record of services personally performed by Tyler Pita, MD. It was created on his behalf by Wilburn Mylar, a trained medical scribe. The creation of this record is based on the scribe's personal observations and the provider's statements to them. This document has been checked and approved by the attending provider.

## 2018-11-03 DIAGNOSIS — C801 Malignant (primary) neoplasm, unspecified: Secondary | ICD-10-CM

## 2018-11-03 DIAGNOSIS — C7972 Secondary malignant neoplasm of left adrenal gland: Secondary | ICD-10-CM | POA: Insufficient documentation

## 2018-11-05 ENCOUNTER — Other Ambulatory Visit: Payer: Self-pay | Admitting: Medical Oncology

## 2018-11-05 DIAGNOSIS — B370 Candidal stomatitis: Secondary | ICD-10-CM

## 2018-11-05 MED ORDER — FLUCONAZOLE 100 MG PO TABS: 100.0000 mg | ORAL_TABLET | Freq: Every day | ORAL | 0 refills | Status: DC

## 2018-11-05 MED FILL — FLUCONAZOLE 100 MG TABLET: 100 | 7 days supply | Qty: 7 | Fill #0

## 2018-11-06 DIAGNOSIS — C7972 Secondary malignant neoplasm of left adrenal gland: Secondary | ICD-10-CM | POA: Diagnosis not present

## 2018-11-06 DIAGNOSIS — Z510 Encounter for antineoplastic radiation therapy: Secondary | ICD-10-CM | POA: Diagnosis not present

## 2018-11-06 DIAGNOSIS — C801 Malignant (primary) neoplasm, unspecified: Secondary | ICD-10-CM | POA: Diagnosis not present

## 2018-11-08 ENCOUNTER — Inpatient Hospital Stay: Payer: 59

## 2018-11-08 ENCOUNTER — Telehealth: Payer: Self-pay | Admitting: Internal Medicine

## 2018-11-08 ENCOUNTER — Inpatient Hospital Stay (HOSPITAL_BASED_OUTPATIENT_CLINIC_OR_DEPARTMENT_OTHER): Payer: 59 | Admitting: Internal Medicine

## 2018-11-08 ENCOUNTER — Encounter: Payer: Self-pay | Admitting: Internal Medicine

## 2018-11-08 VITALS — BP 127/67 | HR 111 | Temp 99.1°F | Resp 20 | Ht 67.0 in | Wt 120.8 lb

## 2018-11-08 VITALS — HR 99

## 2018-11-08 DIAGNOSIS — C792 Secondary malignant neoplasm of skin: Secondary | ICD-10-CM

## 2018-11-08 DIAGNOSIS — C801 Malignant (primary) neoplasm, unspecified: Secondary | ICD-10-CM

## 2018-11-08 DIAGNOSIS — G893 Neoplasm related pain (acute) (chronic): Secondary | ICD-10-CM | POA: Diagnosis not present

## 2018-11-08 DIAGNOSIS — D72829 Elevated white blood cell count, unspecified: Secondary | ICD-10-CM | POA: Diagnosis not present

## 2018-11-08 DIAGNOSIS — Z5111 Encounter for antineoplastic chemotherapy: Secondary | ICD-10-CM

## 2018-11-08 DIAGNOSIS — C7972 Secondary malignant neoplasm of left adrenal gland: Secondary | ICD-10-CM

## 2018-11-08 DIAGNOSIS — C7951 Secondary malignant neoplasm of bone: Secondary | ICD-10-CM

## 2018-11-08 DIAGNOSIS — C7800 Secondary malignant neoplasm of unspecified lung: Secondary | ICD-10-CM | POA: Diagnosis not present

## 2018-11-08 DIAGNOSIS — C799 Secondary malignant neoplasm of unspecified site: Secondary | ICD-10-CM

## 2018-11-08 DIAGNOSIS — C3411 Malignant neoplasm of upper lobe, right bronchus or lung: Secondary | ICD-10-CM

## 2018-11-08 DIAGNOSIS — Z5112 Encounter for antineoplastic immunotherapy: Secondary | ICD-10-CM

## 2018-11-08 DIAGNOSIS — Z79899 Other long term (current) drug therapy: Secondary | ICD-10-CM | POA: Diagnosis not present

## 2018-11-08 LAB — CMP (CANCER CENTER ONLY)
ALT: 29 U/L (ref 0–44)
AST: 31 U/L (ref 15–41)
Albumin: 2.4 g/dL — ABNORMAL LOW (ref 3.5–5.0)
Alkaline Phosphatase: 149 U/L — ABNORMAL HIGH (ref 38–126)
Anion gap: 11 (ref 5–15)
BUN: 11 mg/dL (ref 6–20)
CO2: 27 mmol/L (ref 22–32)
Calcium: 9.2 mg/dL (ref 8.9–10.3)
Chloride: 100 mmol/L (ref 98–111)
Creatinine: 0.8 mg/dL (ref 0.44–1.00)
GFR, Est AFR Am: >60 mL/min (ref >60)
GFR, Estimated: >60 mL/min (ref >60)
Glucose, Bld: 101 mg/dL — ABNORMAL HIGH (ref 70–99)
Potassium: 4.4 mmol/L (ref 3.5–5.1)
Sodium: 138 mmol/L (ref 135–145)
Total Bilirubin: <0.2 mg/dL — ABNORMAL LOW (ref 0.3–1.2)
Total Protein: 7.1 g/dL (ref 6.5–8.1)

## 2018-11-08 LAB — TSH: TSH: 1.082 u[IU]/mL (ref 0.308–3.960)

## 2018-11-08 LAB — CBC WITH DIFFERENTIAL (CANCER CENTER ONLY)
ABS IMMATURE GRANULOCYTES: 0.28 10*3/uL — AB (ref 0.00–0.07)
Basophils Absolute: 0.1 10*3/uL (ref 0.0–0.1)
Basophils Relative: 0 %
Eosinophils Absolute: 0.1 10*3/uL (ref 0.0–0.5)
Eosinophils Relative: 1 %
HCT: 31 % — ABNORMAL LOW (ref 36.0–46.0)
Hemoglobin: 9.3 g/dL — ABNORMAL LOW (ref 12.0–15.0)
Immature Granulocytes: 2 %
LYMPHS PCT: 3 %
Lymphs Abs: 0.6 10*3/uL — ABNORMAL LOW (ref 0.7–4.0)
MCH: 25.8 pg — ABNORMAL LOW (ref 26.0–34.0)
MCHC: 30 g/dL (ref 30.0–36.0)
MCV: 86.1 fL (ref 80.0–100.0)
Monocytes Absolute: 0.9 10*3/uL (ref 0.1–1.0)
Monocytes Relative: 6 %
NEUTROS ABS: 14.8 10*3/uL — AB (ref 1.7–7.7)
Neutrophils Relative %: 88 %
Platelet Count: 573 10*3/uL — ABNORMAL HIGH (ref 150–400)
RBC: 3.6 MIL/uL — ABNORMAL LOW (ref 3.87–5.11)
RDW: 17.7 % — ABNORMAL HIGH (ref 11.5–15.5)
WBC Count: 16.7 10*3/uL — ABNORMAL HIGH (ref 4.0–10.5)
nRBC: 0 % (ref 0.0–0.2)

## 2018-11-08 MED ORDER — ONDANSETRON HCL 4 MG/2ML IJ SOLN
INTRAMUSCULAR | Status: AC
  Filled 2018-11-08: qty 4

## 2018-11-08 MED ORDER — SODIUM CHLORIDE 0.9 % IV SOLN
200.0000 mg | Freq: Once | INTRAVENOUS | Status: AC
  Administered 2018-11-08: 200 mg via INTRAVENOUS
  Filled 2018-11-08: qty 8

## 2018-11-08 MED ORDER — SODIUM CHLORIDE 0.9% FLUSH
10.0000 mL | INTRAVENOUS | Status: DC | PRN
  Administered 2018-11-08: 10 mL
  Filled 2018-11-08: qty 10

## 2018-11-08 MED ORDER — DEXAMETHASONE SODIUM PHOSPHATE 10 MG/ML IJ SOLN
INTRAMUSCULAR | Status: AC
  Filled 2018-11-08: qty 1

## 2018-11-08 MED ORDER — ALTEPLASE 2 MG IJ SOLR
2.0000 mg | Freq: Once | INTRAMUSCULAR | Status: AC | PRN
  Administered 2018-11-08: 2 mg
  Filled 2018-11-08: qty 2

## 2018-11-08 MED ORDER — SODIUM CHLORIDE 0.9 % IV SOLN
490.0000 mg/m2 | Freq: Once | INTRAVENOUS | Status: AC
  Administered 2018-11-08: 800 mg via INTRAVENOUS
  Filled 2018-11-08: qty 20

## 2018-11-08 MED ORDER — SODIUM CHLORIDE 0.9 % IV SOLN
Freq: Once | INTRAVENOUS | Status: AC
  Administered 2018-11-08: 11:00:00 via INTRAVENOUS
  Filled 2018-11-08: qty 250

## 2018-11-08 MED ORDER — ALTEPLASE 2 MG IJ SOLR
INTRAMUSCULAR | Status: AC
  Filled 2018-11-08: qty 2

## 2018-11-08 MED ORDER — ONDANSETRON HCL 4 MG/2ML IJ SOLN
8.0000 mg | Freq: Once | INTRAMUSCULAR | Status: AC
  Administered 2018-11-08: 8 mg via INTRAVENOUS

## 2018-11-08 MED ORDER — DEXAMETHASONE SODIUM PHOSPHATE 10 MG/ML IJ SOLN
10.0000 mg | Freq: Once | INTRAMUSCULAR | Status: AC
  Administered 2018-11-08: 10 mg via INTRAVENOUS

## 2018-11-08 MED ORDER — HEPARIN SOD (PORK) LOCK FLUSH 100 UNIT/ML IV SOLN
500.0000 [IU] | Freq: Once | INTRAVENOUS | Status: AC | PRN
  Administered 2018-11-08: 500 [IU]
  Filled 2018-11-08: qty 5

## 2018-11-08 NOTE — Telephone Encounter (Signed)
Added additional cycles per 2/27 los - pt to get an updated schedule next  Visit.

## 2018-11-08 NOTE — Progress Notes (Signed)
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  Metastatic sarcoma cannot be also excluded at this point. Molecular studies by foundation 1 showed no actionable mutations. PDL 1 expression 99%  PRIOR THERAPY: 1) Palliative radiotherapy to the neck mass and hip lesion under the care of Dr. Tammi Klippel  2) palliative radiotherapy to the left adrenal gland and periaortic lymph node under the care of Dr. Tammi Klippel.  CURRENT THERAPY: Systemic chemotherapy with carboplatin for AUC of 5, Alimta 500 mg/M2 and Keytruda 200 mg IV every 3 weeks.  First dose expected April 10, 2018.  Status post 10 cycles.  Starting from cycle #5 the patient is on maintenance treatment with Alimta and Keytruda.  INTERVAL HISTORY: Rebecca Lucas 63 y.o. female returns to the clinic today for follow-up visit.  The patient is feeling fine today with no concerning complaints except for oral thrush and she was started on treatment with Diflucan.  The patient denied having any chest pain, shortness of breath, cough or hemoptysis.  She denied having any fever or chills.  She has no nausea, vomiting, diarrhea or constipation.  She continues to tolerate her treatment with maintenance Alimta and Keytruda fairly well.  She is here for evaluation before starting cycle #11.   MEDICAL HISTORY:No past medical history on file.  ALLERGIES:  has No Known Allergies.  MEDICATIONS:  Current Outpatient Medications  Medication Sig Dispense Refill  . acetaminophen (TYLENOL) 500 MG tablet Take 1,000 mg by mouth every 6 (six) hours as needed for mild pain.     Marland Kitchen alendronate (FOSAMAX) 70 MG  tablet Take 70 mg by mouth once a week. Wednesday  3  . butalbital-aspirin-caffeine (FIORINAL) 50-325-40 MG capsule Take 1 capsule by mouth every 6 (six) hours as needed for migraine.   1  . Calcium Carbonate-Vitamin D (CALCIUM-D PO) Take 1 tablet by mouth 2 (two) times daily.    . fluconazole (DIFLUCAN) 100 MG tablet Take 1 tablet (100 mg total) by mouth daily. 7 tablet 0  . folic acid (FOLVITE) 1 MG tablet Take 1 tablet (1 mg total) by mouth daily. 90 tablet 1  . ibuprofen (ADVIL,MOTRIN) 200 MG tablet Take 600 mg by mouth every 6 (six) hours as needed for mild pain.     Marland Kitchen lidocaine-prilocaine (EMLA) cream Apply 1 application topically as needed. 30 g 0  . LUTEIN PO Take 1 tablet by mouth daily.    Marland Kitchen omeprazole (PRILOSEC) 20 MG capsule Take 1 capsule (20 mg total) by mouth daily. 90 capsule 1  . ondansetron (ZOFRAN) 8 MG tablet Take 1 tablet (8 mg total) by mouth every 8 (eight) hours as needed for nausea or vomiting. 20 tablet 0  . traMADol (ULTRAM) 50 MG tablet Take 1 tablet (50 mg total) by mouth every 12 (twelve) hours as needed for moderate pain. 60 tablet 0  . zolpidem (AMBIEN) 10 MG tablet Take 1 tablet (10 mg total) by mouth at bedtime as needed for sleep. 30 tablet 0   No current facility-administered medications for this visit.     SURGICAL HISTORY:  Past Surgical History:  Procedure Laterality Date  . IR IMAGING GUIDED PORT INSERTION  06/26/2018  REVIEW OF SYSTEMS:  A comprehensive review of systems was negative except for: Constitutional: positive for fatigue   PHYSICAL EXAMINATION: General appearance: alert, cooperative, fatigued and no distress Head: Normocephalic, without obvious abnormality, atraumatic Neck: no adenopathy, no JVD, supple, symmetrical, trachea midline and thyroid not enlarged, symmetric, no tenderness/mass/nodules Lymph nodes: Cervical, supraclavicular, and axillary nodes normal. Resp: clear to auscultation bilaterally Back: symmetric, no curvature. ROM  normal. No CVA tenderness. Cardio: regular rate and rhythm, S1, S2 normal, no murmur, click, rub or gallop GI: soft, non-tender; bowel sounds normal; no masses,  no organomegaly Extremities: extremities normal, atraumatic, no cyanosis or edema  ECOG PERFORMANCE STATUS: 1 - Symptomatic but completely ambulatory  Blood pressure 127/67, pulse (!) 111, temperature 99.1 F (37.3 C), temperature source Oral, resp. rate 20, height 5\' 7"  (1.702 m), weight 120 lb 12.8 oz (54.8 kg), SpO2 100 %.  LABORATORY DATA: Lab Results  Component Value Date   WBC 16.7 (H) 11/08/2018   HGB 9.3 (L) 11/08/2018   HCT 31.0 (L) 11/08/2018   MCV 86.1 11/08/2018   PLT 573 (H) 11/08/2018      Chemistry      Component Value Date/Time   NA 138 10/18/2018 1025   K 4.2 10/18/2018 1025   CL 102 10/18/2018 1025   CO2 27 10/18/2018 1025   BUN 10 10/18/2018 1025   CREATININE 0.75 10/18/2018 1025      Component Value Date/Time   CALCIUM 9.3 10/18/2018 1025   ALKPHOS 98 10/18/2018 1025   AST 38 10/18/2018 1025   ALT 32 10/18/2018 1025   BILITOT 0.4 10/18/2018 1025       RADIOGRAPHIC STUDIES: Ct Soft Tissue Neck W Contrast  Result Date: 10/16/2018 CLINICAL DATA:  History of right-sided lung cancer. Chemotherapy, immunotherapy and radiation therapy. Cervical spinal metastatic disease. Right suboccipital scalp nodule. EXAM: CT NECK WITH CONTRAST TECHNIQUE: Multidetector CT imaging of the neck was performed using the standard protocol following the bolus administration of intravenous contrast. CONTRAST:  164mL OMNIPAQUE IOHEXOL 300 MG/ML  SOLN COMPARISON:  08/02/2018 FINDINGS: Pharynx and larynx: No mucosal or submucosal lesion is seen. Salivary glands: Parotid and submandibular glands are normal. Thyroid: Normal Lymph nodes: No enlarged or low-density cervical region lymph nodes. Vascular: Normal Limited intracranial: Normal Visualized orbits: Normal Mastoids and visualized paranasal sinuses: Clear Skeleton: Treated  metastatic lesions affecting the posterior elements/spinous processes of C2 and C3 appears stable. No progressive finding. No new lesion elsewhere in the cervical spine. Upper chest: See results of chest CT. Other: Small density in the suboccipital fat to the right of midline at the location of a previously treated nodule. No evidence of residual or recurrent tumor in that location. IMPRESSION: Chronic destructive changes of the spinous processes of C2 and C3 related to metastatic disease at that level. No evidence of progressive disease. Small subcutaneous density in the right suboccipital fat related to a previously treated nodule in that location. Electronically Signed   By: Nelson Chimes M.D.   On: 10/16/2018 10:18   Ct Chest W Contrast  Result Date: 10/16/2018 CLINICAL DATA:  Non-small cell lung cancer restaging, chemotherapy and immunotherapy in progress. EXAM: CT CHEST, ABDOMEN, AND PELVIS WITH CONTRAST TECHNIQUE: Multidetector CT imaging of the chest, abdomen and pelvis was performed following the standard protocol during bolus administration of intravenous contrast. CONTRAST:  130mL OMNIPAQUE IOHEXOL 300 MG/ML  SOLN COMPARISON:  08/02/2018 FINDINGS: CT CHEST FINDINGS Cardiovascular: Right Port-A-Cath tip: Right atrium. Atherosclerotic calcification of the aortic arch. Mediastinum/Nodes: Gas-filled and  borderline dilated upper thoracic esophagus. No pathologic adenopathy. Lungs/Pleura: Increasing right upper lobe consolidation and volume loss. There is greater volume loss in the superior segment right lower lobe although the perihilar and infrahilar peribronchovascular airspace in this region is more compact than on the prior exam, and with less of a periphery of ground-glass opacity. Although the right upper lobe mass is difficult to measure due to the surrounding airspace opacity, size is estimated at 3.9 by 1.9 cm, formerly 4.0 by 2.3 cm, a slight reduction in overall size. Trace right pleural effusion,  nonspecific for transudative or exudative etiology. New bandlike density medially in the left upper lobe potentially from atelectasis or possibly radiation pneumonitis. Musculoskeletal: Stable mildly displaced fracture the right fourth rib laterally. Healing fracture of the right seventh rib laterally. Faint sclerosis anteriorly in the T10 vertebral body, about 1.4 cm craniocaudad, similar to 08/02/18 but new compared to 03/18/2018, and therefore concerning for possible malignancy. Increased sclerosis of the right fifth rib along the anterior costochondral junction, image 50/2. CT ABDOMEN PELVIS FINDINGS Hepatobiliary: Unremarkable Pancreas: Unremarkable Spleen: Unremarkable Adrenals/Urinary Tract: Increased size of the left adrenal mass currently 3.0 by 2.2 cm, formerly 1.6 by 1.0 cm. Increased heterogeneity of the mass with some suspected internal necrosis. The kidneys appear unremarkable. Right adrenal gland normal. Stomach/Bowel: Unremarkable Vascular/Lymphatic: 1.1 cm left eccentric periaortic lymph node on image 62/2, formerly 0.9 cm. Additional small periaortic lymph nodes are present but are not pathologically enlarged. Reproductive: Unremarkable.  Incidental retroverted uterus. Other: No supplemental non-categorized findings. Musculoskeletal: 95% compression fracture at L2 similar to prior. Associated vertebral sclerosis noted without a significant degree of posterior bony retropulsion. Stable 8 mm sclerotic lesion in the left posterior acetabulum. Stable 2.0 cm lytic lesion of the right ischium. 2.0 cm sclerotic lesion of the right sacral ala, no change from prior. IMPRESSION: 1. Mixed appearance with reduced size of the right upper lobe mass, but with significant increase in size of the left adrenal metastatic lesion. 2. A 1.4 cm mildly sclerotic lesion in the T10 vertebral body anteriorly is stable from the most recent prior exam, but new from July 2019, and therefore suspicious for a metastatic lesion.  3. Stable prominent compression fracture at L2. 4. Gas-filled in borderline dilated upper thoracic esophagus. 5. There is some increase in right upper lobe consolidation and volume loss much of which may be from radiation therapy. Increased volume loss in the superior segment right lower lobe although the overall area of involvement in the superior segment is reduced. 6. Stable mildly displaced fracture of the right fourth rib laterally. Healing fracture of the right lateral seventh rib. 7. Mildly enlarged left periaortic lymph node, 1.1 cm today and previously 0.9 cm. 8. Increased sclerosis along the anterior right fifth rib margin, possibly from nondisplaced fracture or a small metastatic lesion. Electronically Signed   By: Van Clines M.D.   On: 10/16/2018 13:26   Ct Abdomen Pelvis W Contrast  Result Date: 10/16/2018 CLINICAL DATA:  Non-small cell lung cancer restaging, chemotherapy and immunotherapy in progress. EXAM: CT CHEST, ABDOMEN, AND PELVIS WITH CONTRAST TECHNIQUE: Multidetector CT imaging of the chest, abdomen and pelvis was performed following the standard protocol during bolus administration of intravenous contrast. CONTRAST:  160mL OMNIPAQUE IOHEXOL 300 MG/ML  SOLN COMPARISON:  08/02/2018 FINDINGS: CT CHEST FINDINGS Cardiovascular: Right Port-A-Cath tip: Right atrium. Atherosclerotic calcification of the aortic arch. Mediastinum/Nodes: Gas-filled and borderline dilated upper thoracic esophagus. No pathologic adenopathy. Lungs/Pleura: Increasing right upper lobe consolidation and volume loss.  There is greater volume loss in the superior segment right lower lobe although the perihilar and infrahilar peribronchovascular airspace in this region is more compact than on the prior exam, and with less of a periphery of ground-glass opacity. Although the right upper lobe mass is difficult to measure due to the surrounding airspace opacity, size is estimated at 3.9 by 1.9 cm, formerly 4.0 by 2.3 cm, a  slight reduction in overall size. Trace right pleural effusion, nonspecific for transudative or exudative etiology. New bandlike density medially in the left upper lobe potentially from atelectasis or possibly radiation pneumonitis. Musculoskeletal: Stable mildly displaced fracture the right fourth rib laterally. Healing fracture of the right seventh rib laterally. Faint sclerosis anteriorly in the T10 vertebral body, about 1.4 cm craniocaudad, similar to 08/02/18 but new compared to 03/18/2018, and therefore concerning for possible malignancy. Increased sclerosis of the right fifth rib along the anterior costochondral junction, image 50/2. CT ABDOMEN PELVIS FINDINGS Hepatobiliary: Unremarkable Pancreas: Unremarkable Spleen: Unremarkable Adrenals/Urinary Tract: Increased size of the left adrenal mass currently 3.0 by 2.2 cm, formerly 1.6 by 1.0 cm. Increased heterogeneity of the mass with some suspected internal necrosis. The kidneys appear unremarkable. Right adrenal gland normal. Stomach/Bowel: Unremarkable Vascular/Lymphatic: 1.1 cm left eccentric periaortic lymph node on image 62/2, formerly 0.9 cm. Additional small periaortic lymph nodes are present but are not pathologically enlarged. Reproductive: Unremarkable.  Incidental retroverted uterus. Other: No supplemental non-categorized findings. Musculoskeletal: 95% compression fracture at L2 similar to prior. Associated vertebral sclerosis noted without a significant degree of posterior bony retropulsion. Stable 8 mm sclerotic lesion in the left posterior acetabulum. Stable 2.0 cm lytic lesion of the right ischium. 2.0 cm sclerotic lesion of the right sacral ala, no change from prior. IMPRESSION: 1. Mixed appearance with reduced size of the right upper lobe mass, but with significant increase in size of the left adrenal metastatic lesion. 2. A 1.4 cm mildly sclerotic lesion in the T10 vertebral body anteriorly is stable from the most recent prior exam, but new  from July 2019, and therefore suspicious for a metastatic lesion. 3. Stable prominent compression fracture at L2. 4. Gas-filled in borderline dilated upper thoracic esophagus. 5. There is some increase in right upper lobe consolidation and volume loss much of which may be from radiation therapy. Increased volume loss in the superior segment right lower lobe although the overall area of involvement in the superior segment is reduced. 6. Stable mildly displaced fracture of the right fourth rib laterally. Healing fracture of the right lateral seventh rib. 7. Mildly enlarged left periaortic lymph node, 1.1 cm today and previously 0.9 cm. 8. Increased sclerosis along the anterior right fifth rib margin, possibly from nondisplaced fracture or a small metastatic lesion. Electronically Signed   By: Van Clines M.D.   On: 10/16/2018 13:26    ASSESSMENT AND PLAN: This is a very pleasant 63 years old white female with metastatic poorly differentiated malignant neoplasm of unknown primary at this point but according to the pathologist at the cancer center it is likely poorly differentiated non-small cell carcinoma of lung primary. 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 PDL 1 expression was 99%. The molecular studies by foundation 1 as well as [employer] showed no actionable mutations. The patient was a started on treatment with systemic chemotherapy with carboplatin for AUC of 5, Alimta 500 mg/M2 and Keytruda 200 mg IV every 3 weeks.  She is status post 4 cycles with partial  response.  The patient was started on maintenance treatment with Alimta and Keytruda status post 6 cycles. She has been tolerating this treatment well. I recommended for her to proceed with cycle #7 of the maintenance therapy. She will continue with the palliative radiotherapy to the left adrenal gland and periaortic lymph node as recommended by Dr. Tammi Klippel. For the leukocytosis, the patient is  currently asymptomatic.  This could be concerning for early infection or reactive in nature.  We will continue to monitor closely. I will see her back for follow-up visit in 3 weeks for evaluation before the next cycle of her treatment. The patient was advised to call immediately if she has any concerning symptoms in the interval. The patient voices understanding of current disease status and treatment options and is in agreement with the current care plan. All questions were answered. The patient knows to call the clinic with any problems, questions or concerns. We can certainly see the patient much sooner if necessary.  Disclaimer: This note was dictated with voice recognition software. Similar sounding words can inadvertently be transcribed and may not be corrected upon review.

## 2018-11-08 NOTE — Patient Instructions (Signed)
[facility] Discharge Instructions for Patients Receiving Chemotherapy  Today you received the following chemotherapy agents: Pembrolizumab (Keytruda) and Pemetrexed (Alimta)  To help prevent nausea and vomiting after your treatment, we encourage you to take your nausea medication as directed.    If you develop nausea and vomiting that is not controlled by your nausea medication, call the clinic.   BELOW ARE SYMPTOMS THAT SHOULD BE REPORTED IMMEDIATELY:  *FEVER GREATER THAN 100.5 F  *CHILLS WITH OR WITHOUT FEVER  NAUSEA AND VOMITING THAT IS NOT CONTROLLED WITH YOUR NAUSEA MEDICATION  *UNUSUAL SHORTNESS OF BREATH  *UNUSUAL BRUISING OR BLEEDING  TENDERNESS IN MOUTH AND THROAT WITH OR WITHOUT PRESENCE OF ULCERS  *URINARY PROBLEMS  *BOWEL PROBLEMS  UNUSUAL RASH Items with * indicate a potential emergency and should be followed up as soon as possible.  Feel free to call the clinic should you have any questions or concerns. The clinic phone number is [phone].  Please show the [facility] at check-in to the Emergency Department and triage nurse.

## 2018-11-13 ENCOUNTER — Ambulatory Visit
Admission: RE | Admit: 2018-11-13 | Discharge: 2018-11-13 | Disposition: A | Discharge status: Home / Self Care | Payer: 59 | Source: Ambulatory Visit | Attending: Radiation Oncology | Admitting: Radiation Oncology

## 2018-11-13 DIAGNOSIS — C7972 Secondary malignant neoplasm of left adrenal gland: Secondary | ICD-10-CM | POA: Diagnosis not present

## 2018-11-13 DIAGNOSIS — Z510 Encounter for antineoplastic radiation therapy: Secondary | ICD-10-CM | POA: Insufficient documentation

## 2018-11-13 DIAGNOSIS — C801 Malignant (primary) neoplasm, unspecified: Secondary | ICD-10-CM | POA: Diagnosis not present

## 2018-11-14 ENCOUNTER — Ambulatory Visit: Payer: 59 | Admitting: Radiation Oncology

## 2018-11-15 ENCOUNTER — Ambulatory Visit
Admission: RE | Admit: 2018-11-15 | Discharge: 2018-11-15 | Disposition: A | Discharge status: Home / Self Care | Payer: 59 | Source: Ambulatory Visit | Attending: Radiation Oncology | Admitting: Radiation Oncology

## 2018-11-15 DIAGNOSIS — C801 Malignant (primary) neoplasm, unspecified: Secondary | ICD-10-CM | POA: Diagnosis not present

## 2018-11-15 DIAGNOSIS — Z510 Encounter for antineoplastic radiation therapy: Secondary | ICD-10-CM | POA: Diagnosis not present

## 2018-11-15 DIAGNOSIS — C7972 Secondary malignant neoplasm of left adrenal gland: Secondary | ICD-10-CM | POA: Diagnosis not present

## 2018-11-16 ENCOUNTER — Ambulatory Visit: Payer: 59 | Admitting: Radiation Oncology

## 2018-11-19 ENCOUNTER — Ambulatory Visit
Admission: RE | Admit: 2018-11-19 | Discharge: 2018-11-19 | Disposition: A | Discharge status: Home / Self Care | Payer: 59 | Source: Ambulatory Visit | Attending: Radiation Oncology | Admitting: Radiation Oncology

## 2018-11-19 DIAGNOSIS — Z510 Encounter for antineoplastic radiation therapy: Secondary | ICD-10-CM | POA: Diagnosis not present

## 2018-11-19 DIAGNOSIS — C801 Malignant (primary) neoplasm, unspecified: Secondary | ICD-10-CM | POA: Diagnosis not present

## 2018-11-19 DIAGNOSIS — C7972 Secondary malignant neoplasm of left adrenal gland: Secondary | ICD-10-CM | POA: Diagnosis not present

## 2018-11-21 ENCOUNTER — Other Ambulatory Visit: Payer: Self-pay

## 2018-11-21 ENCOUNTER — Ambulatory Visit
Admission: RE | Admit: 2018-11-21 | Discharge: 2018-11-21 | Disposition: A | Discharge status: Home / Self Care | Payer: 59 | Source: Ambulatory Visit | Attending: Radiation Oncology | Admitting: Radiation Oncology

## 2018-11-21 DIAGNOSIS — Z510 Encounter for antineoplastic radiation therapy: Secondary | ICD-10-CM | POA: Diagnosis not present

## 2018-11-21 DIAGNOSIS — C801 Malignant (primary) neoplasm, unspecified: Secondary | ICD-10-CM | POA: Diagnosis not present

## 2018-11-21 DIAGNOSIS — C7972 Secondary malignant neoplasm of left adrenal gland: Secondary | ICD-10-CM | POA: Diagnosis not present

## 2018-11-23 ENCOUNTER — Encounter: Payer: Self-pay | Admitting: Radiation Oncology

## 2018-11-23 ENCOUNTER — Other Ambulatory Visit: Payer: Self-pay

## 2018-11-23 ENCOUNTER — Ambulatory Visit
Admission: RE | Admit: 2018-11-23 | Discharge: 2018-11-23 | Disposition: A | Discharge status: Home / Self Care | Payer: 59 | Source: Ambulatory Visit | Attending: Radiation Oncology | Admitting: Radiation Oncology

## 2018-11-23 DIAGNOSIS — Z510 Encounter for antineoplastic radiation therapy: Secondary | ICD-10-CM | POA: Diagnosis not present

## 2018-11-23 DIAGNOSIS — C7972 Secondary malignant neoplasm of left adrenal gland: Secondary | ICD-10-CM | POA: Diagnosis not present

## 2018-11-23 DIAGNOSIS — C801 Malignant (primary) neoplasm, unspecified: Secondary | ICD-10-CM | POA: Diagnosis not present

## 2018-11-29 ENCOUNTER — Inpatient Hospital Stay: Payer: 59

## 2018-11-29 ENCOUNTER — Inpatient Hospital Stay: Payer: 59 | Attending: Internal Medicine

## 2018-11-29 ENCOUNTER — Other Ambulatory Visit: Payer: Self-pay

## 2018-11-29 ENCOUNTER — Inpatient Hospital Stay (HOSPITAL_BASED_OUTPATIENT_CLINIC_OR_DEPARTMENT_OTHER): Payer: 59 | Admitting: Internal Medicine

## 2018-11-29 ENCOUNTER — Encounter: Payer: Self-pay | Admitting: Internal Medicine

## 2018-11-29 VITALS — BP 103/65 | HR 111 | Temp 98.4°F | Resp 20 | Ht 67.0 in | Wt 120.2 lb

## 2018-11-29 VITALS — HR 100

## 2018-11-29 DIAGNOSIS — C792 Secondary malignant neoplasm of skin: Secondary | ICD-10-CM | POA: Diagnosis not present

## 2018-11-29 DIAGNOSIS — C799 Secondary malignant neoplasm of unspecified site: Secondary | ICD-10-CM

## 2018-11-29 DIAGNOSIS — C801 Malignant (primary) neoplasm, unspecified: Secondary | ICD-10-CM

## 2018-11-29 DIAGNOSIS — C7951 Secondary malignant neoplasm of bone: Secondary | ICD-10-CM

## 2018-11-29 DIAGNOSIS — R5383 Other fatigue: Secondary | ICD-10-CM

## 2018-11-29 DIAGNOSIS — C7972 Secondary malignant neoplasm of left adrenal gland: Secondary | ICD-10-CM | POA: Diagnosis not present

## 2018-11-29 DIAGNOSIS — M545 Low back pain: Secondary | ICD-10-CM | POA: Insufficient documentation

## 2018-11-29 DIAGNOSIS — D649 Anemia, unspecified: Secondary | ICD-10-CM | POA: Insufficient documentation

## 2018-11-29 DIAGNOSIS — Z5112 Encounter for antineoplastic immunotherapy: Secondary | ICD-10-CM | POA: Insufficient documentation

## 2018-11-29 DIAGNOSIS — C7800 Secondary malignant neoplasm of unspecified lung: Secondary | ICD-10-CM | POA: Diagnosis not present

## 2018-11-29 DIAGNOSIS — Z5111 Encounter for antineoplastic chemotherapy: Secondary | ICD-10-CM

## 2018-11-29 DIAGNOSIS — Z79899 Other long term (current) drug therapy: Secondary | ICD-10-CM | POA: Insufficient documentation

## 2018-11-29 DIAGNOSIS — C3490 Malignant neoplasm of unspecified part of unspecified bronchus or lung: Secondary | ICD-10-CM

## 2018-11-29 DIAGNOSIS — C3411 Malignant neoplasm of upper lobe, right bronchus or lung: Secondary | ICD-10-CM

## 2018-11-29 LAB — CBC WITH DIFFERENTIAL (CANCER CENTER ONLY)
Abs Immature Granulocytes: 0.38 10*3/uL — ABNORMAL HIGH (ref 0.00–0.07)
BASOS ABS: 0.1 10*3/uL (ref 0.0–0.1)
Basophils Relative: 0 %
Eosinophils Absolute: 0.1 10*3/uL (ref 0.0–0.5)
Eosinophils Relative: 0 %
HEMATOCRIT: 27.8 % — AB (ref 36.0–46.0)
Hemoglobin: 8.5 g/dL — ABNORMAL LOW (ref 12.0–15.0)
Immature Granulocytes: 2 %
Lymphocytes Relative: 2 %
Lymphs Abs: 0.3 10*3/uL — ABNORMAL LOW (ref 0.7–4.0)
MCH: 26.2 pg (ref 26.0–34.0)
MCHC: 30.6 g/dL (ref 30.0–36.0)
MCV: 85.8 fL (ref 80.0–100.0)
Monocytes Absolute: 1.3 10*3/uL — ABNORMAL HIGH (ref 0.1–1.0)
Monocytes Relative: 7 %
Neutro Abs: 16.2 10*3/uL — ABNORMAL HIGH (ref 1.7–7.7)
Neutrophils Relative %: 89 %
Platelet Count: 562 10*3/uL — ABNORMAL HIGH (ref 150–400)
RBC: 3.24 MIL/uL — ABNORMAL LOW (ref 3.87–5.11)
RDW: 19.1 % — ABNORMAL HIGH (ref 11.5–15.5)
WBC Count: 18.4 10*3/uL — ABNORMAL HIGH (ref 4.0–10.5)
nRBC: 0 % (ref 0.0–0.2)

## 2018-11-29 LAB — CMP (CANCER CENTER ONLY)
ALBUMIN: 2.2 g/dL — AB (ref 3.5–5.0)
ALT: 22 U/L (ref 0–44)
AST: 22 U/L (ref 15–41)
Alkaline Phosphatase: 136 U/L — ABNORMAL HIGH (ref 38–126)
Anion gap: 11 (ref 5–15)
BUN: 11 mg/dL (ref 6–20)
CO2: 23 mmol/L (ref 22–32)
Calcium: 8.8 mg/dL — ABNORMAL LOW (ref 8.9–10.3)
Chloride: 103 mmol/L (ref 98–111)
Creatinine: 0.68 mg/dL (ref 0.44–1.00)
GFR, Est AFR Am: >60 mL/min (ref >60)
GFR, Estimated: >60 mL/min (ref >60)
GLUCOSE: 120 mg/dL — AB (ref 70–99)
Potassium: 4 mmol/L (ref 3.5–5.1)
SODIUM: 137 mmol/L (ref 135–145)
Total Bilirubin: <0.2 mg/dL — ABNORMAL LOW (ref 0.3–1.2)
Total Protein: 6.7 g/dL (ref 6.5–8.1)

## 2018-11-29 LAB — TSH: TSH: 2.508 u[IU]/mL (ref 0.308–3.960)

## 2018-11-29 MED ORDER — DEXAMETHASONE SODIUM PHOSPHATE 10 MG/ML IJ SOLN
INTRAMUSCULAR | Status: AC
  Filled 2018-11-29: qty 1

## 2018-11-29 MED ORDER — DEXAMETHASONE SODIUM PHOSPHATE 10 MG/ML IJ SOLN
10.0000 mg | Freq: Once | INTRAMUSCULAR | Status: AC
  Administered 2018-11-29: 10 mg via INTRAVENOUS

## 2018-11-29 MED ORDER — ALTEPLASE 2 MG IJ SOLR
INTRAMUSCULAR | Status: AC
  Filled 2018-11-29: qty 2

## 2018-11-29 MED ORDER — SODIUM CHLORIDE 0.9 % IV SOLN
200.0000 mg | Freq: Once | INTRAVENOUS | Status: AC
  Administered 2018-11-29: 200 mg via INTRAVENOUS
  Filled 2018-11-29: qty 8

## 2018-11-29 MED ORDER — ONDANSETRON HCL 4 MG/2ML IJ SOLN
8.0000 mg | Freq: Once | INTRAMUSCULAR | Status: AC
  Administered 2018-11-29: 8 mg via INTRAVENOUS

## 2018-11-29 MED ORDER — SODIUM CHLORIDE 0.9% FLUSH
10.0000 mL | INTRAVENOUS | Status: DC | PRN
  Administered 2018-11-29: 10 mL
  Filled 2018-11-29: qty 10

## 2018-11-29 MED ORDER — ONDANSETRON HCL 4 MG/2ML IJ SOLN
INTRAMUSCULAR | Status: AC
  Filled 2018-11-29: qty 4

## 2018-11-29 MED ORDER — SODIUM CHLORIDE 0.9 % IV SOLN
Freq: Once | INTRAVENOUS | Status: AC
  Administered 2018-11-29: 12:00:00 via INTRAVENOUS
  Filled 2018-11-29: qty 250

## 2018-11-29 MED ORDER — ALTEPLASE 2 MG IJ SOLR
2.0000 mg | Freq: Once | INTRAMUSCULAR | Status: AC | PRN
  Administered 2018-11-29: 2 mg
  Filled 2018-11-29: qty 2

## 2018-11-29 MED ORDER — HEPARIN SOD (PORK) LOCK FLUSH 100 UNIT/ML IV SOLN
500.0000 [IU] | Freq: Once | INTRAVENOUS | Status: AC | PRN
  Administered 2018-11-29: 500 [IU]
  Filled 2018-11-29: qty 5

## 2018-11-29 MED ORDER — SODIUM CHLORIDE 0.9 % IV SOLN
490.0000 mg/m2 | Freq: Once | INTRAVENOUS | Status: AC
  Administered 2018-11-29: 800 mg via INTRAVENOUS
  Filled 2018-11-29: qty 20

## 2018-11-29 MED FILL — FOLIC ACID 1 MG TABS: 1 | 30 days supply | Qty: 30 | Fill #3

## 2018-11-29 NOTE — Progress Notes (Signed)
Unable to obtain blood return after several minutes of port aerobics. CathFlow instilled at 11:03. Will continue to monitor  Checked for blood return at 11:33. After pt performing several deep breaths, blood return noted. CathFlow and 5 mLs of bright red blood removed. Treatment orders released.

## 2018-11-29 NOTE — Patient Instructions (Signed)
Coronavirus (COVID-19) Are you at risk?  Are you at risk for the Coronavirus (COVID-19)?  To be considered HIGH RISK for Coronavirus (COVID-19), you have to meet the following criteria:  . Traveled to [place], [place], [place], [place] or [place]; or in the [place] to [place], [place], [place], or [place]; and have fever, cough, and shortness of breath within the last 2 weeks of travel OR . Been in close contact with a person diagnosed with COVID-19 within the last 2 weeks and have fever, cough, and shortness of breath . IF YOU DO NOT MEET THESE CRITERIA, YOU ARE CONSIDERED LOW RISK FOR COVID-19.  What to do if you are HIGH RISK for COVID-19?  . If you are having a medical emergency, call 911. . Seek medical care right away. Before you go to a doctor's office, urgent care or emergency department, call ahead and tell them about your recent travel, contact with someone diagnosed with COVID-19, and your symptoms. You should receive instructions from your physician's office regarding next steps of care.  . When you arrive at healthcare provider, tell the healthcare staff immediately you have returned from visiting [place], [place], [place], [place] or [place]; or traveled in the [place] to [place], [place], [place], or [place]; in the last two weeks or you have been in close contact with a person diagnosed with COVID-19 in the last 2 weeks.   . Tell the health care staff about your symptoms: fever, cough and shortness of breath. . After you have been seen by a medical provider, you will be either: o Tested for (COVID-19) and discharged home on quarantine except to seek medical care if symptoms worsen, and asked to  - Stay home and avoid contact with others until you get your results (4-5 days)  - Avoid travel on public transportation if possible (such as bus, train, or airplane) or o Sent to the Emergency Department by EMS for evaluation, COVID-19 testing, and possible  admission depending on your condition and test results.  What to do if you are LOW RISK for COVID-19?  Reduce your risk of any infection by using the same precautions used for avoiding the common cold or flu:  . Wash your hands often with soap and warm water for at least 20 seconds.  If soap and water are not readily available, use an alcohol-based hand sanitizer with at least 60% alcohol.  . If coughing or sneezing, cover your mouth and nose by coughing or sneezing into the elbow areas of your shirt or coat, into a tissue or into your sleeve (not your hands). . Avoid shaking hands with others and consider head nods or verbal greetings only. . Avoid touching your eyes, nose, or mouth with unwashed hands.  . Avoid close contact with people who are sick. . Avoid places or events with large numbers of people in one location, like concerts or sporting events. . Carefully consider travel plans you have or are making. . If you are planning any travel outside or inside the [place], visit the CDC's Travelers' Health webpage for the latest health notices. . If you have some symptoms but not all symptoms, continue to monitor at home and seek medical attention if your symptoms worsen. . If you are having a medical emergency, call 911.  ADDITIONAL HEALTHCARE OPTIONS FOR PATIENTS  [facility] / e-Visit: [email or web address]         [facility]: [phone]  [facility]: [facility]: [facility] Discharge Instructions for Patients Receiving Chemotherapy  Today you received the following chemotherapy agents: Pembrolizumab (Keytruda) and Pemetrexed (Alimta)  To help prevent nausea and vomiting after your treatment, we encourage you to take your nausea medication as directed.    If you develop nausea and vomiting that is not controlled by your nausea  medication, call the clinic.   BELOW ARE SYMPTOMS THAT SHOULD BE REPORTED IMMEDIATELY:  *FEVER GREATER THAN 100.5 F  *CHILLS WITH OR WITHOUT FEVER  NAUSEA AND VOMITING THAT IS NOT CONTROLLED WITH YOUR NAUSEA MEDICATION  *UNUSUAL SHORTNESS OF BREATH  *UNUSUAL BRUISING OR BLEEDING  TENDERNESS IN MOUTH AND THROAT WITH OR WITHOUT PRESENCE OF ULCERS  *URINARY PROBLEMS  *BOWEL PROBLEMS  UNUSUAL RASH Items with * indicate a potential emergency and should be followed up as soon as possible.  Feel free to call the clinic should you have any questions or concerns. The clinic phone number is [phone].  Please show the [facility] at check-in to the Emergency Department and triage nurse.

## 2018-11-29 NOTE — Progress Notes (Signed)
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  Metastatic sarcoma cannot be also excluded at this point. Molecular studies by [facility] 1 showed no actionable mutations. PDL 1 expression 99%  PRIOR THERAPY: 1) Palliative radiotherapy to the neck mass and hip lesion under the care of Dr. Tammi Klippel  2) palliative radiotherapy to the left adrenal gland and periaortic lymph node under the care of Dr. Tammi Klippel.  CURRENT THERAPY: Systemic chemotherapy with carboplatin for AUC of 5, Alimta 500 mg/M2 and Keytruda 200 mg IV every 3 weeks.  First dose expected April 10, 2018.  Status post 11 cycles.  Starting from cycle #5 the patient is on maintenance treatment with Alimta and Keytruda.  INTERVAL HISTORY: Rebecca Lucas 63 y.o. female returns to the clinic today for follow-up visit.  The patient is feeling fine today with no concerning complaints except for mild fatigue and low back pain.  She completed a course of palliative radiotherapy to the left adrenal gland and periaortic lymph node recently.  She denied having any current chest pain, shortness of breath, cough or hemoptysis.  She denied having any fever or chills.  She has no nausea, vomiting, diarrhea or constipation.  She has no headache or visual changes.  The patient is here today for evaluation before starting cycle #12.   MEDICAL HISTORY:No past medical history on file.  ALLERGIES:  has No Known Allergies.  MEDICATIONS:  Current Outpatient Medications  Medication Sig Dispense Refill  . acetaminophen (TYLENOL) 500 MG tablet Take 1,000 mg by mouth every 6 (six) hours as needed for  mild pain.     Marland Kitchen alendronate (FOSAMAX) 70 MG tablet Take 70 mg by mouth once a week. Wednesday  3  . butalbital-aspirin-caffeine (FIORINAL) 50-325-40 MG capsule Take 1 capsule by mouth every 6 (six) hours as needed for migraine.   1  . Calcium Carbonate-Vitamin D (CALCIUM-D PO) Take 1 tablet by mouth 2 (two) times daily.    . fluconazole (DIFLUCAN) 100 MG tablet Take 1 tablet (100 mg total) by mouth daily. 7 tablet 0  . folic acid (FOLVITE) 1 MG tablet Take 1 tablet (1 mg total) by mouth daily. 90 tablet 1  . ibuprofen (ADVIL,MOTRIN) 200 MG tablet Take 600 mg by mouth every 6 (six) hours as needed for mild pain.     Marland Kitchen lidocaine-prilocaine (EMLA) cream Apply 1 application topically as needed. 30 g 0  . LUTEIN PO Take 1 tablet by mouth daily.    Marland Kitchen omeprazole (PRILOSEC) 20 MG capsule Take 1 capsule (20 mg total) by mouth daily. 90 capsule 1  . ondansetron (ZOFRAN) 8 MG tablet Take 1 tablet (8 mg total) by mouth every 8 (eight) hours as needed for nausea or vomiting. 20 tablet 0  . traMADol (ULTRAM) 50 MG tablet Take 1 tablet (50 mg total) by mouth every 12 (twelve) hours as needed for moderate pain. 60 tablet 0  . zolpidem (AMBIEN) 10 MG tablet Take 1 tablet (10 mg total) by mouth at bedtime as needed for sleep. 30 tablet 0   No current facility-administered medications for this visit.     SURGICAL HISTORY:  Past Surgical History:  Procedure Laterality Date  .  IR IMAGING GUIDED PORT INSERTION  06/26/2018    REVIEW OF SYSTEMS:  A comprehensive review of systems was negative except for: Constitutional: positive for fatigue Musculoskeletal: positive for back pain   PHYSICAL EXAMINATION: General appearance: alert, cooperative, fatigued and no distress Head: Normocephalic, without obvious abnormality, atraumatic Neck: no adenopathy, no JVD, supple, symmetrical, trachea midline and thyroid not enlarged, symmetric, no tenderness/mass/nodules Lymph nodes: Cervical, supraclavicular, and axillary  nodes normal. Resp: clear to auscultation bilaterally Back: symmetric, no curvature. ROM normal. No CVA tenderness. Cardio: regular rate and rhythm, S1, S2 normal, no murmur, click, rub or gallop GI: soft, non-tender; bowel sounds normal; no masses,  no organomegaly Extremities: extremities normal, atraumatic, no cyanosis or edema  ECOG PERFORMANCE STATUS: 1 - Symptomatic but completely ambulatory  Blood pressure 103/65, pulse (!) 111, temperature 98.4 F (36.9 C), temperature source Oral, resp. rate 20, height 5\' 7"  (1.702 m), weight 120 lb 3.2 oz (54.5 kg), SpO2 100 %.  LABORATORY DATA: Lab Results  Component Value Date   WBC 18.4 (H) 11/29/2018   HGB 8.5 (L) 11/29/2018   HCT 27.8 (L) 11/29/2018   MCV 85.8 11/29/2018   PLT 562 (H) 11/29/2018      Chemistry      Component Value Date/Time   NA 138 11/08/2018 1030   K 4.4 11/08/2018 1030   CL 100 11/08/2018 1030   CO2 27 11/08/2018 1030   BUN 11 11/08/2018 1030   CREATININE 0.80 11/08/2018 1030      Component Value Date/Time   CALCIUM 9.2 11/08/2018 1030   ALKPHOS 149 (H) 11/08/2018 1030   AST 31 11/08/2018 1030   ALT 29 11/08/2018 1030   BILITOT <0.2 (L) 11/08/2018 1030       RADIOGRAPHIC STUDIES: No results found.  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 PDL 1 expression was 99%. The molecular studies by [facility] as well as [employer] showed no actionable mutations. The patient was a started on treatment with systemic chemotherapy with carboplatin for AUC of 5, Alimta 500 mg/M2 and Keytruda 200 mg IV every 3 weeks.  She is status post 4 cycles with partial response.  The patient was  started on maintenance treatment with Alimta and Keytruda status post 7 cycles. She continues to tolerate the treatment well except for the fatigue. I recommended for the patient to proceed with cycle #8 of her maintenance treatment today as scheduled. I will see her back for follow-up visit in 3 weeks for evaluation after repeating CT scan of the chest, abdomen and pelvis for restaging of her disease. The patient was advised to take over-the-counter oral iron tablets for the anemia. She was advised to call immediately if she has any concerning symptoms in the interval. The patient voices understanding of current disease status and treatment options and is in agreement with the current care plan. All questions were answered. The patient knows to call the clinic with any problems, questions or concerns. We can certainly see the patient much sooner if necessary.  Disclaimer: This note was dictated with voice recognition software. Similar sounding words can inadvertently be transcribed and may not be corrected upon review.

## 2018-12-04 NOTE — Progress Notes (Signed)
  Radiation Oncology         [phone] ________________________________  Name: Rebecca Lucas MRN: [id number]  Date: 11/23/2018  DOB: 1956-08-12  End of Treatment Note  Diagnosis:   63 y.o. female with metastatic poorly differentiated malignant neoplasm of theright upper lung with disease progression to the left adrenal gland and left periaortic lymph node  Indication for treatment:  Curative       Radiation treatment dates:   11/13/2018, 11/15/2018, 11/19/2018, 11/21/2018, 11/23/2018  Site/dose:   The tumor in the left adrenal gland and the left periaortic node were treated with a course of stereotactic body radiation treatment. The patient received 50 Gy in 5 fractions at 10 Gy per fraction.  Beams/energy:   SBRT/SRT-VMAT // 6X-FFF Photon  Narrative: The patient tolerated radiation treatment relatively well.   She continued with pain in her lower back and reported mild fatigue and occasional abdominal cramping during treatment.   Plan: The patient has completed radiation treatment. The patient will return to radiation oncology clinic for routine followup in one month. I advised the patient to call or return sooner if they have any questions or concerns related to their recovery or treatment.   ------------------------------------------------   Tyler Pita, MD [facility] Director and Director of Stereotactic Radiosurgery Direct Dial: [phone]  Fax: [phone] [email or web address]  Skype  [email or web address]  This document serves as a record of services personally performed by Tyler Pita, MD. It was created on his behalf by Rae Lips, a trained medical scribe. The creation of this record is based on the scribe's personal observations and the provider's statements to them. This document has been checked and approved by the attending provider.

## 2018-12-06 ENCOUNTER — Other Ambulatory Visit: Payer: Self-pay | Admitting: Internal Medicine

## 2018-12-06 ENCOUNTER — Telehealth: Payer: Self-pay | Admitting: Medical Oncology

## 2018-12-06 DIAGNOSIS — R112 Nausea with vomiting, unspecified: Secondary | ICD-10-CM

## 2018-12-06 MED ORDER — TRAMADOL HCL 50 MG PO TABS: 50.0000 mg | ORAL_TABLET | Freq: Two times a day (BID) | ORAL | 0 refills | Status: DC | PRN

## 2018-12-06 MED ORDER — FOLIC ACID 1 MG PO TABS: 1.0000 mg | ORAL_TABLET | Freq: Every day | ORAL | 1 refills | Status: DC

## 2018-12-06 MED ORDER — ONDANSETRON HCL 8 MG PO TABS: 8.0000 mg | ORAL_TABLET | Freq: Three times a day (TID) | ORAL | 0 refills | Status: DC | PRN

## 2018-12-06 NOTE — Telephone Encounter (Signed)
Refills requested  Zofran, Folic acid ( #42) tramadol ( low back pain increasing).  She is asking if Julien Nordmann will refill fosamax and I told her to contact PCP since PCP prescribed it . She uses [facility]

## 2018-12-07 MED FILL — ONDANSETRON HCL 8 MG TABLET: 8 | 7 days supply | Qty: 20 | Fill #0

## 2018-12-07 MED FILL — traMADol HCL 50 MG TABS: 50 | 30 days supply | Qty: 60 | Fill #0

## 2018-12-07 MED FILL — LINZESS 72 MCG CAPSULE: 72 | 30 days supply | Qty: 30 | Fill #1

## 2018-12-07 MED FILL — ALENDRONATE NA 70 MG TAB: 70 | 84 days supply | Qty: 12 | Fill #0

## 2018-12-17 ENCOUNTER — Telehealth: Payer: Self-pay | Admitting: Radiation Oncology

## 2018-12-17 NOTE — Telephone Encounter (Signed)
Phoned patient back. Explained the providers plan to look at her restaging scan tomorrow to determine if there is a cause for the pain she reports. Per Shona Simpson, PA-C instructed patient to alternate tylenol and motrin for now. Patient verbalized understanding and expressed appreciation for the return call.

## 2018-12-17 NOTE — Telephone Encounter (Signed)
Received call from patient requesting to speak with [name], PA-C. Explained she isn't in the office today and offered to assist. Patient expresses concern about sharp shooting pains up and down her legs and her hips intermittently seizing up. Patient states, "this never happened before I received the radiation." Patient reports these symptoms have been present x 2 weeks. Patient denies numbness, tingling to weakness of lower extremities. Patient denies bowel or bladder complaints. Reports taking Tramadol, Tylenol and Motrin for relief. Reports she obtains the most relief with Motrin but "knows Dr. Julien Nordmann wouldn't want her taking too much of it." Patient understands this RN will inform the provider of these findings and phone back with further direction.   Diagnosis:   63 y.o. female with metastatic poorly differentiated malignant neoplasm of theright upper lung with disease progression to the left adrenal gland and left periaortic lymph node  Indication for treatment:  Curative       Radiation treatment dates:   11/13/2018, 11/15/2018, 11/19/2018, 11/21/2018, 11/23/2018

## 2018-12-18 ENCOUNTER — Other Ambulatory Visit: Payer: 59

## 2018-12-18 ENCOUNTER — Inpatient Hospital Stay: Payer: 59

## 2018-12-18 ENCOUNTER — Ambulatory Visit (HOSPITAL_COMMUNITY)
Admission: RE | Admit: 2018-12-18 | Discharge: 2018-12-18 | Disposition: A | Discharge status: Home / Self Care | Payer: 59 | Source: Ambulatory Visit | Attending: Internal Medicine | Admitting: Internal Medicine

## 2018-12-18 ENCOUNTER — Other Ambulatory Visit: Payer: Self-pay

## 2018-12-18 DIAGNOSIS — C3411 Malignant neoplasm of upper lobe, right bronchus or lung: Secondary | ICD-10-CM | POA: Diagnosis not present

## 2018-12-18 DIAGNOSIS — C3490 Malignant neoplasm of unspecified part of unspecified bronchus or lung: Secondary | ICD-10-CM | POA: Diagnosis not present

## 2018-12-18 MED ORDER — IOHEXOL 300 MG/ML  SOLN
100.0000 mL | Freq: Once | INTRAMUSCULAR | Status: AC | PRN
  Administered 2018-12-18: 100 mL via INTRAVENOUS

## 2018-12-18 MED ORDER — HEPARIN SOD (PORK) LOCK FLUSH 100 UNIT/ML IV SOLN
INTRAVENOUS | Status: AC
  Administered 2018-12-18: 500 [IU] via INTRAVENOUS
  Filled 2018-12-18: qty 5

## 2018-12-18 MED ORDER — SODIUM CHLORIDE (PF) 0.9 % IJ SOLN
INTRAMUSCULAR | Status: AC
  Filled 2018-12-18: qty 50

## 2018-12-18 MED ORDER — HEPARIN SOD (PORK) LOCK FLUSH 100 UNIT/ML IV SOLN
500.0000 [IU] | Freq: Once | INTRAVENOUS | Status: AC
  Administered 2018-12-18: 09:00:00 500 [IU] via INTRAVENOUS

## 2018-12-20 ENCOUNTER — Inpatient Hospital Stay: Payer: 59 | Attending: Internal Medicine | Admitting: Internal Medicine

## 2018-12-20 ENCOUNTER — Inpatient Hospital Stay: Payer: 59

## 2018-12-20 ENCOUNTER — Encounter: Payer: Self-pay | Admitting: Internal Medicine

## 2018-12-20 ENCOUNTER — Other Ambulatory Visit: Payer: Self-pay

## 2018-12-20 ENCOUNTER — Other Ambulatory Visit: Payer: 59

## 2018-12-20 VITALS — HR 94

## 2018-12-20 VITALS — BP 120/52 | HR 107 | Temp 98.3°F | Resp 18 | Ht 67.0 in | Wt 122.4 lb

## 2018-12-20 DIAGNOSIS — Z5112 Encounter for antineoplastic immunotherapy: Secondary | ICD-10-CM | POA: Diagnosis not present

## 2018-12-20 DIAGNOSIS — C792 Secondary malignant neoplasm of skin: Secondary | ICD-10-CM | POA: Diagnosis not present

## 2018-12-20 DIAGNOSIS — C7951 Secondary malignant neoplasm of bone: Secondary | ICD-10-CM | POA: Insufficient documentation

## 2018-12-20 DIAGNOSIS — Z5189 Encounter for other specified aftercare: Secondary | ICD-10-CM | POA: Insufficient documentation

## 2018-12-20 DIAGNOSIS — D630 Anemia in neoplastic disease: Secondary | ICD-10-CM | POA: Diagnosis not present

## 2018-12-20 DIAGNOSIS — C7972 Secondary malignant neoplasm of left adrenal gland: Secondary | ICD-10-CM | POA: Diagnosis not present

## 2018-12-20 DIAGNOSIS — M545 Low back pain: Secondary | ICD-10-CM | POA: Diagnosis not present

## 2018-12-20 DIAGNOSIS — Z5111 Encounter for antineoplastic chemotherapy: Secondary | ICD-10-CM | POA: Diagnosis not present

## 2018-12-20 DIAGNOSIS — Z79899 Other long term (current) drug therapy: Secondary | ICD-10-CM | POA: Diagnosis not present

## 2018-12-20 DIAGNOSIS — C799 Secondary malignant neoplasm of unspecified site: Secondary | ICD-10-CM

## 2018-12-20 DIAGNOSIS — C7800 Secondary malignant neoplasm of unspecified lung: Secondary | ICD-10-CM | POA: Diagnosis not present

## 2018-12-20 DIAGNOSIS — C801 Malignant (primary) neoplasm, unspecified: Secondary | ICD-10-CM | POA: Insufficient documentation

## 2018-12-20 DIAGNOSIS — E279 Disorder of adrenal gland, unspecified: Secondary | ICD-10-CM | POA: Insufficient documentation

## 2018-12-20 DIAGNOSIS — M899 Disorder of bone, unspecified: Secondary | ICD-10-CM

## 2018-12-20 DIAGNOSIS — C3411 Malignant neoplasm of upper lobe, right bronchus or lung: Secondary | ICD-10-CM

## 2018-12-20 LAB — CMP (CANCER CENTER ONLY)
ALT: 28 U/L (ref 0–44)
AST: 33 U/L (ref 15–41)
Albumin: 2.2 g/dL — ABNORMAL LOW (ref 3.5–5.0)
Alkaline Phosphatase: 161 U/L — ABNORMAL HIGH (ref 38–126)
Anion gap: 11 (ref 5–15)
BUN: 14 mg/dL (ref 8–23)
CO2: 24 mmol/L (ref 22–32)
Calcium: 8.9 mg/dL (ref 8.9–10.3)
Chloride: 100 mmol/L (ref 98–111)
Creatinine: 0.71 mg/dL (ref 0.44–1.00)
GFR, Est AFR Am: >60 mL/min (ref >60)
GFR, Estimated: >60 mL/min (ref >60)
Glucose, Bld: 118 mg/dL — ABNORMAL HIGH (ref 70–99)
Potassium: 4.5 mmol/L (ref 3.5–5.1)
Sodium: 135 mmol/L (ref 135–145)
Total Bilirubin: <0.2 mg/dL — ABNORMAL LOW (ref 0.3–1.2)
Total Protein: 6.7 g/dL (ref 6.5–8.1)

## 2018-12-20 LAB — CBC WITH DIFFERENTIAL (CANCER CENTER ONLY)
Abs Immature Granulocytes: 0.26 10*3/uL — ABNORMAL HIGH (ref 0.00–0.07)
Basophils Absolute: 0.1 10*3/uL (ref 0.0–0.1)
Basophils Relative: 0 %
Eosinophils Absolute: 0.1 10*3/uL (ref 0.0–0.5)
Eosinophils Relative: 1 %
HCT: 27.5 % — ABNORMAL LOW (ref 36.0–46.0)
Hemoglobin: 8.3 g/dL — ABNORMAL LOW (ref 12.0–15.0)
Immature Granulocytes: 2 %
Lymphocytes Relative: 2 %
Lymphs Abs: 0.3 10*3/uL — ABNORMAL LOW (ref 0.7–4.0)
MCH: 26.6 pg (ref 26.0–34.0)
MCHC: 30.2 g/dL (ref 30.0–36.0)
MCV: 88.1 fL (ref 80.0–100.0)
Monocytes Absolute: 1.2 10*3/uL — ABNORMAL HIGH (ref 0.1–1.0)
Monocytes Relative: 8 %
Neutro Abs: 14.1 10*3/uL — ABNORMAL HIGH (ref 1.7–7.7)
Neutrophils Relative %: 87 %
Platelet Count: 543 10*3/uL — ABNORMAL HIGH (ref 150–400)
RBC: 3.12 MIL/uL — ABNORMAL LOW (ref 3.87–5.11)
RDW: 20.4 % — ABNORMAL HIGH (ref 11.5–15.5)
WBC Count: 16 10*3/uL — ABNORMAL HIGH (ref 4.0–10.5)
nRBC: 0 % (ref 0.0–0.2)

## 2018-12-20 MED ORDER — HEPARIN SOD (PORK) LOCK FLUSH 100 UNIT/ML IV SOLN
500.0000 [IU] | Freq: Once | INTRAVENOUS | Status: AC | PRN
  Administered 2018-12-20: 15:00:00 500 [IU]
  Filled 2018-12-20: qty 5

## 2018-12-20 MED ORDER — SODIUM CHLORIDE 0.9 % IV SOLN
Freq: Once | INTRAVENOUS | Status: AC
  Administered 2018-12-20: 14:00:00 via INTRAVENOUS
  Filled 2018-12-20: qty 250

## 2018-12-20 MED ORDER — DEXAMETHASONE SODIUM PHOSPHATE 10 MG/ML IJ SOLN
10.0000 mg | Freq: Once | INTRAMUSCULAR | Status: AC
  Administered 2018-12-20: 14:00:00 10 mg via INTRAVENOUS

## 2018-12-20 MED ORDER — SODIUM CHLORIDE 0.9 % IV SOLN
490.0000 mg/m2 | Freq: Once | INTRAVENOUS | Status: AC
  Administered 2018-12-20: 15:00:00 800 mg via INTRAVENOUS
  Filled 2018-12-20: qty 12

## 2018-12-20 MED ORDER — DEXAMETHASONE SODIUM PHOSPHATE 10 MG/ML IJ SOLN
INTRAMUSCULAR | Status: AC
  Filled 2018-12-20: qty 1

## 2018-12-20 MED ORDER — ALTEPLASE 2 MG IJ SOLR
INTRAMUSCULAR | Status: AC
  Filled 2018-12-20: qty 2

## 2018-12-20 MED ORDER — SODIUM CHLORIDE 0.9% FLUSH
10.0000 mL | INTRAVENOUS | Status: DC | PRN
  Administered 2018-12-20: 10 mL
  Filled 2018-12-20: qty 10

## 2018-12-20 MED ORDER — SODIUM CHLORIDE 0.9% FLUSH
10.0000 mL | INTRAVENOUS | Status: DC | PRN
  Administered 2018-12-20 (×2): 10 mL
  Filled 2018-12-20: qty 10

## 2018-12-20 MED ORDER — CYANOCOBALAMIN 1000 MCG/ML IJ SOLN
1000.0000 ug | Freq: Once | INTRAMUSCULAR | Status: AC
  Administered 2018-12-20: 14:00:00 1000 ug via INTRAMUSCULAR

## 2018-12-20 MED ORDER — ALTEPLASE 2 MG IJ SOLR
2.0000 mg | Freq: Once | INTRAMUSCULAR | Status: AC | PRN
  Administered 2018-12-20: 12:00:00 2 mg
  Filled 2018-12-20: qty 2

## 2018-12-20 MED ORDER — ONDANSETRON HCL 4 MG/2ML IJ SOLN
INTRAMUSCULAR | Status: AC
  Filled 2018-12-20: qty 4

## 2018-12-20 MED ORDER — SODIUM CHLORIDE 0.9 % IV SOLN
200.0000 mg | Freq: Once | INTRAVENOUS | Status: AC
  Administered 2018-12-20: 14:00:00 200 mg via INTRAVENOUS
  Filled 2018-12-20: qty 8

## 2018-12-20 MED ORDER — CYANOCOBALAMIN 1000 MCG/ML IJ SOLN
INTRAMUSCULAR | Status: AC
  Filled 2018-12-20: qty 1

## 2018-12-20 MED ORDER — ONDANSETRON HCL 4 MG/2ML IJ SOLN
8.0000 mg | Freq: Once | INTRAMUSCULAR | Status: AC
  Administered 2018-12-20: 8 mg via INTRAVENOUS

## 2018-12-20 NOTE — Progress Notes (Signed)
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  Metastatic sarcoma cannot be also excluded at this point. Molecular studies by foundation 1 showed no actionable mutations. PDL 1 expression 99%  PRIOR THERAPY: 1) Palliative radiotherapy to the neck mass and hip lesion under the care of Dr. Tammi Klippel  2) palliative radiotherapy to the left adrenal gland and periaortic lymph node under the care of Dr. Tammi Klippel.  CURRENT THERAPY: Systemic chemotherapy with carboplatin for AUC of 5, Alimta 500 mg/M2 and Keytruda 200 mg IV every 3 weeks.  First dose expected April 10, 2018.  Status post 12 cycles.  Starting from cycle #5 the patient is on maintenance treatment with Alimta and Keytruda.  INTERVAL HISTORY: Rebecca Lucas 63 y.o. female returns to the clinic today for follow-up visit.  The patient is feeling fine today with no concerning complaints except for the persistent low back pain.  She is currently on tramadol twice daily in addition to Tylenol on as-needed basis.  The patient mentions that her pain is not well controlled.  She denied having any current chest pain and her cough has improved.  She denied having any shortness of breath or hemoptysis.  She has no nausea, vomiting, diarrhea or constipation.  She denied having any headache or visual changes.  She has no recent weight loss or night sweats.  She has no fever or chills.  She continues to tolerate her treatment with Alimta and Keytruda fairly well.  The patient had repeat CT scan of the chest, abdomen and pelvis performed recently and she is here for evaluation and discussion of her scan  results.  MEDICAL HISTORY:No past medical history on file.  ALLERGIES:  has No Known Allergies.  MEDICATIONS:  Current Outpatient Medications  Medication Sig Dispense Refill   acetaminophen (TYLENOL) 500 MG tablet Take 1,000 mg by mouth every 6 (six) hours as needed for mild pain.      alendronate (FOSAMAX) 70 MG tablet Take 70 mg by mouth once a week. Wednesday  3   butalbital-aspirin-caffeine (FIORINAL) 50-325-40 MG capsule Take 1 capsule by mouth every 6 (six) hours as needed for migraine.   1   Calcium Carbonate-Vitamin D (CALCIUM-D PO) Take 1 tablet by mouth 2 (two) times daily.     fluconazole (DIFLUCAN) 100 MG tablet Take 1 tablet (100 mg total) by mouth daily. 7 tablet 0   folic acid (FOLVITE) 1 MG tablet Take 1 tablet (1 mg total) by mouth daily. 90 tablet 1   ibuprofen (ADVIL,MOTRIN) 200 MG tablet Take 600 mg by mouth every 6 (six) hours as needed for mild pain.      lidocaine-prilocaine (EMLA) cream Apply 1 application topically as needed. 30 g 0   LUTEIN PO Take 1 tablet by mouth daily.     omeprazole (PRILOSEC) 20 MG capsule Take 1 capsule (20 mg total) by mouth daily. 90 capsule 1   ondansetron (ZOFRAN) 8 MG tablet Take 1 tablet (8 mg total) by mouth every 8 (eight) hours as needed for nausea or vomiting. 20 tablet 0   traMADol (ULTRAM) 50 MG tablet Take 1 tablet (50 mg total) by mouth every 12 (twelve) hours as needed  for moderate pain. 60 tablet 0   zolpidem (AMBIEN) 10 MG tablet Take 1 tablet (10 mg total) by mouth at bedtime as needed for sleep. 30 tablet 0   No current facility-administered medications for this visit.     SURGICAL HISTORY:  Past Surgical History:  Procedure Laterality Date   IR IMAGING GUIDED PORT INSERTION  06/26/2018    REVIEW OF SYSTEMS:  Constitutional: negative Eyes: negative Ears, nose, mouth, throat, and face: negative Respiratory: negative Cardiovascular: negative Gastrointestinal:  negative Genitourinary:negative Integument/breast: negative Hematologic/lymphatic: negative Musculoskeletal:positive for back pain Neurological: negative Behavioral/Psych: negative Endocrine: negative Allergic/Immunologic: negative   PHYSICAL EXAMINATION: General appearance: alert, cooperative, fatigued and no distress Head: Normocephalic, without obvious abnormality, atraumatic Neck: no adenopathy, no JVD, supple, symmetrical, trachea midline and thyroid not enlarged, symmetric, no tenderness/mass/nodules Lymph nodes: Cervical, supraclavicular, and axillary nodes normal. Resp: clear to auscultation bilaterally Back: symmetric, no curvature. ROM normal. No CVA tenderness. Cardio: regular rate and rhythm, S1, S2 normal, no murmur, click, rub or gallop GI: soft, non-tender; bowel sounds normal; no masses,  no organomegaly Extremities: extremities normal, atraumatic, no cyanosis or edema Neurologic: Alert and oriented X 3, normal strength and tone. Normal symmetric reflexes. Normal coordination and gait  ECOG PERFORMANCE STATUS: 1 - Symptomatic but completely ambulatory  Blood pressure (!) 120/52, pulse (!) 107, temperature 98.3 F (36.8 C), temperature source Oral, resp. rate 18, height 5\' 7"  (1.702 m), weight 122 lb 6.4 oz (55.5 kg), SpO2 99 %.  LABORATORY DATA: Lab Results  Component Value Date   WBC 16.0 (H) 12/20/2018   HGB 8.3 (L) 12/20/2018   HCT 27.5 (L) 12/20/2018   MCV 88.1 12/20/2018   PLT 543 (H) 12/20/2018      Chemistry      Component Value Date/Time   NA 137 11/29/2018 0912   K 4.0 11/29/2018 0912   CL 103 11/29/2018 0912   CO2 23 11/29/2018 0912   BUN 11 11/29/2018 0912   CREATININE 0.68 11/29/2018 0912      Component Value Date/Time   CALCIUM 8.8 (L) 11/29/2018 0912   ALKPHOS 136 (H) 11/29/2018 0912   AST 22 11/29/2018 0912   ALT 22 11/29/2018 0912   BILITOT <0.2 (L) 11/29/2018 0912       RADIOGRAPHIC STUDIES: Ct Chest W Contrast  Result Date:  12/18/2018 CLINICAL DATA:  Right lung cancer diagnosed 10 months ago. Radiation therapy completed. Chemotherapy in progress. Bilateral hip pain. EXAM: CT CHEST, ABDOMEN, AND PELVIS WITH CONTRAST TECHNIQUE: Multidetector CT imaging of the chest, abdomen and pelvis was performed following the standard protocol during bolus administration of intravenous contrast. CONTRAST:  131mL OMNIPAQUE IOHEXOL 300 MG/ML  SOLN COMPARISON:  CT 10/16/2018 and 08/02/2018. FINDINGS: CT CHEST FINDINGS Cardiovascular: Mild aortic atherosclerosis. Right IJ Port-A-Cath extends to the superior cavoatrial junction. No acute vascular findings. The heart size is normal. There is no pericardial effusion. Mediastinum/Nodes: There are no enlarged mediastinal, hilar or axillary lymph nodes. Stable mild esophageal dilatation and wall thickening, attributed to radiation therapy. The trachea and thyroid gland appear unremarkable. Lungs/Pleura: A small amount of loculated pleural fluid posteriorly in the upper right hemithorax is stable. The right upper lobe mass remains partially obscured by adjacent radiation changes, although appears slightly smaller, measuring 3.2 x 1.9 cm on image 19/2 (previously 3.9 x 1.9 cm). The paramediastinal radiation changes and biapical scarring are stable. No new or enlarging pulmonary nodules. Musculoskeletal/Chest wall: Stable sclerosis anteriorly in the T10 vertebral body. Stable mildly displaced fracture of the  right 4th rib laterally (image 36/6). Healed fracture of the right 7th rib. No lytic lesions are observed. CT ABDOMEN AND PELVIS FINDINGS Hepatobiliary: Mild diffuse hepatic steatosis without focal lesion or abnormal enhancement. No evidence of gallstones, gallbladder wall thickening or biliary dilatation. Pancreas: Unremarkable. No pancreatic ductal dilatation or surrounding inflammatory changes. Spleen: Normal in size without focal abnormality. Adrenals/Urinary Tract: Interval decreased size of left adrenal  mass, measuring 2.2 x 1.8 cm on image 57/2 (previously 3.0 x 2.2 cm). The right adrenal gland appears normal. The kidneys, ureters and bladder appear normal. Stomach/Bowel: No evidence of bowel wall thickening, distention or surrounding inflammatory change. Vascular/Lymphatic: There are no enlarged abdominal or pelvic lymph nodes. No acute vascular findings. Variant celiac anatomy and mild aortic atherosclerosis noted. Reproductive: The uterus and ovaries appear normal. No adnexal mass. Other: No ascites or peritoneal nodularity. The anterior abdominal wall is intact. Musculoskeletal: There has been slight further anterior collapse at the L2 fracture but no significant osseous retropulsion. Underlying vertebral body sclerosis at L2 appears stable. There is a new anterior paraspinal fluid collection at the level of the fracture, measuring up to 2 cm in thickness on image 61/2. No evidence of epidural fluid collection. The inferior endplate of L1 and the superior endplate of L3 appear intact without evidence of discitis. Sclerotic lesions in the right sacrum (image 90/2) and the left posterior acetabulum (image 107/2) are stable. There is a stable lucent lesion in the right ischial tuberosity (image 118/2). IMPRESSION: 1. Further improvement in treated right upper lobe pulmonary lesion with evolving surrounding radiation changes. 2. Decreased size of left adrenal mass consistent with treated metastasis. 3. Scattered sclerotic osseous lesions are stable. There is progressive loss of vertebral body height anteriorly at L2 fracture with an associated new low-density anterior paraspinal fluid collection. There is no epidural fluid collection or mass effect on the thecal sac. There is no adjacent endplate destruction to suggest discitis, and these findings could be related to radiation therapy of the left adrenal lesion. If concern of infection, lumbar MRI without and with contrast may be helpful. 4. No other disease  progression or new metastases identified. 5. These results were called by telephone at the time of interpretation on 12/18/2018 at 2:27 pm to Dr. Curt Bears , who verbally acknowledged these results. Electronically Signed   By: Richardean Sale M.D.   On: 12/18/2018 14:27   Ct Abdomen Pelvis W Contrast  Result Date: 12/18/2018 CLINICAL DATA:  Right lung cancer diagnosed 10 months ago. Radiation therapy completed. Chemotherapy in progress. Bilateral hip pain. EXAM: CT CHEST, ABDOMEN, AND PELVIS WITH CONTRAST TECHNIQUE: Multidetector CT imaging of the chest, abdomen and pelvis was performed following the standard protocol during bolus administration of intravenous contrast. CONTRAST:  164mL OMNIPAQUE IOHEXOL 300 MG/ML  SOLN COMPARISON:  CT 10/16/2018 and 08/02/2018. FINDINGS: CT CHEST FINDINGS Cardiovascular: Mild aortic atherosclerosis. Right IJ Port-A-Cath extends to the superior cavoatrial junction. No acute vascular findings. The heart size is normal. There is no pericardial effusion. Mediastinum/Nodes: There are no enlarged mediastinal, hilar or axillary lymph nodes. Stable mild esophageal dilatation and wall thickening, attributed to radiation therapy. The trachea and thyroid gland appear unremarkable. Lungs/Pleura: A small amount of loculated pleural fluid posteriorly in the upper right hemithorax is stable. The right upper lobe mass remains partially obscured by adjacent radiation changes, although appears slightly smaller, measuring 3.2 x 1.9 cm on image 19/2 (previously 3.9 x 1.9 cm). The paramediastinal radiation changes and biapical scarring are stable. No  new or enlarging pulmonary nodules. Musculoskeletal/Chest wall: Stable sclerosis anteriorly in the T10 vertebral body. Stable mildly displaced fracture of the right 4th rib laterally (image 36/6). Healed fracture of the right 7th rib. No lytic lesions are observed. CT ABDOMEN AND PELVIS FINDINGS Hepatobiliary: Mild diffuse hepatic steatosis without  focal lesion or abnormal enhancement. No evidence of gallstones, gallbladder wall thickening or biliary dilatation. Pancreas: Unremarkable. No pancreatic ductal dilatation or surrounding inflammatory changes. Spleen: Normal in size without focal abnormality. Adrenals/Urinary Tract: Interval decreased size of left adrenal mass, measuring 2.2 x 1.8 cm on image 57/2 (previously 3.0 x 2.2 cm). The right adrenal gland appears normal. The kidneys, ureters and bladder appear normal. Stomach/Bowel: No evidence of bowel wall thickening, distention or surrounding inflammatory change. Vascular/Lymphatic: There are no enlarged abdominal or pelvic lymph nodes. No acute vascular findings. Variant celiac anatomy and mild aortic atherosclerosis noted. Reproductive: The uterus and ovaries appear normal. No adnexal mass. Other: No ascites or peritoneal nodularity. The anterior abdominal wall is intact. Musculoskeletal: There has been slight further anterior collapse at the L2 fracture but no significant osseous retropulsion. Underlying vertebral body sclerosis at L2 appears stable. There is a new anterior paraspinal fluid collection at the level of the fracture, measuring up to 2 cm in thickness on image 61/2. No evidence of epidural fluid collection. The inferior endplate of L1 and the superior endplate of L3 appear intact without evidence of discitis. Sclerotic lesions in the right sacrum (image 90/2) and the left posterior acetabulum (image 107/2) are stable. There is a stable lucent lesion in the right ischial tuberosity (image 118/2). IMPRESSION: 1. Further improvement in treated right upper lobe pulmonary lesion with evolving surrounding radiation changes. 2. Decreased size of left adrenal mass consistent with treated metastasis. 3. Scattered sclerotic osseous lesions are stable. There is progressive loss of vertebral body height anteriorly at L2 fracture with an associated new low-density anterior paraspinal fluid collection.  There is no epidural fluid collection or mass effect on the thecal sac. There is no adjacent endplate destruction to suggest discitis, and these findings could be related to radiation therapy of the left adrenal lesion. If concern of infection, lumbar MRI without and with contrast may be helpful. 4. No other disease progression or new metastases identified. 5. These results were called by telephone at the time of interpretation on 12/18/2018 at 2:27 pm to Dr. Curt Bears , who verbally acknowledged these results. Electronically Signed   By: Richardean Sale M.D.   On: 12/18/2018 14:27    ASSESSMENT AND PLAN: This is a very pleasant 63 years old white female with metastatic poorly differentiated malignant neoplasm of unknown primary at this point but according to the pathologist at the cancer center it is likely poorly differentiated non-small cell carcinoma of lung primary. 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 PDL 1 expression was 99%. The molecular studies by foundation 1 as well as [employer] showed no actionable mutations. The patient was a started on treatment with systemic chemotherapy with carboplatin for AUC of 5, Alimta 500 mg/M2 and Keytruda 200 mg IV every 3 weeks.  She is status post 4 cycles with partial response.  The patient was started on maintenance treatment with Alimta and Keytruda status post 8 cycles. She continues to tolerate this treatment well with no concerning adverse effects.  She also recently underwent palliative radiotherapy to the enlarging adrenal and mediastinal lymph nodes. The patient had repeat CT scan  of the chest, abdomen and pelvis performed recently.  I personally and independently reviewed the scan images and discussed the results with the patient today. PET scan showed improvement in the treated left adrenal gland lesion.  There was also improvement in the right upper lobe pulmonary lesion. This scan also showed low  density anterior paraspinal fluid collection close to the L2 fracture of unclear etiology but could be concerning for discitis. I recommended for the patient to have MRI of the lumbar spine for further evaluation of this lesion. Depending on the MRI results I may order aspiration of the fluid or other studies for confirmation of the etiology. For pain management I recommended for the patient to increase her tramadol to every 6 hours in addition to Tylenol and occasional ibuprofen if needed. The patient will continue with her current treatment and she will proceed with cycle #9 of her maintenance treatment today. I will see her back for follow-up visit in 3 weeks for evaluation before the next cycle of her treatment. The patient was advised to call immediately if she has any concerning symptoms in the interval. The patient voices understanding of current disease status and treatment options and is in agreement with the current care plan. All questions were answered. The patient knows to call the clinic with any problems, questions or concerns. We can certainly see the patient much sooner if necessary.  Disclaimer: This note was dictated with voice recognition software. Similar sounding words can inadvertently be transcribed and may not be corrected upon review.

## 2018-12-26 ENCOUNTER — Ambulatory Visit: Payer: Self-pay | Admitting: Urology

## 2018-12-27 ENCOUNTER — Other Ambulatory Visit: Payer: Self-pay | Admitting: Internal Medicine

## 2018-12-27 ENCOUNTER — Ambulatory Visit (HOSPITAL_COMMUNITY): Admission: RE | Admit: 2018-12-27 | Payer: 59 | Source: Ambulatory Visit

## 2018-12-27 ENCOUNTER — Telehealth: Payer: Self-pay | Admitting: *Deleted

## 2018-12-27 MED ORDER — FENTANYL 25 MCG/HR TD PT72: 1.0000 | MEDICATED_PATCH | TRANSDERMAL | 0 refills | Status: DC

## 2018-12-27 MED FILL — FOLIC ACID 1 MG TABS: 1 | 30 days supply | Qty: 30 | Fill #4

## 2018-12-27 MED FILL — FENTANYL 25 MCG/HR PT72: 25 | 15 days supply | Qty: 5 | Fill #0

## 2018-12-27 NOTE — Telephone Encounter (Signed)
Fentanyl patch was sent to [facility].  MRI of the lumbar spine should nclude a good portion of the sacrum.

## 2018-12-27 NOTE — Telephone Encounter (Signed)
Pt called and left message about pain not controlled yet.  Spoke with pt and was informed that pt is taking Tramadol every 6 hours as needed - with very little relief.  Stated she noticed swelling at sacral area and Left leg numbness yesterday.   Pt can still walk some.  Pt has MRI Lumbar on 01/02/19. Pt wanted to know if Dr. Julien Nordmann would include MRI of sacral area as well. Per pt, MD and pt had discussed about switching pt to Fentanyl patch for pain.  Pt would like to try the patch for more pain control.  Pt uses  [facility]. Pt's   Phone    [phone].

## 2018-12-28 NOTE — Telephone Encounter (Signed)
TCT patient and informed of prescription for Fentanyl patch at [facility]. And reminded that she has an MRI scheduled at [facility] on 01/02/19.  Pt voiced understanding.Reviewed instructions for applying patch and using tramadol for breakthrough pain as needed. Pt again voiced understanding.

## 2018-12-31 ENCOUNTER — Other Ambulatory Visit: Payer: Self-pay | Admitting: Internal Medicine

## 2018-12-31 ENCOUNTER — Telehealth: Payer: Self-pay | Admitting: *Deleted

## 2018-12-31 DIAGNOSIS — R112 Nausea with vomiting, unspecified: Secondary | ICD-10-CM

## 2018-12-31 MED ORDER — TRAMADOL HCL 50 MG PO TABS: 50.0000 mg | ORAL_TABLET | Freq: Two times a day (BID) | ORAL | 0 refills | Status: DC | PRN

## 2018-12-31 MED ORDER — LINACLOTIDE 72 MCG PO CAPS: 72.0000 ug | ORAL_CAPSULE | Freq: Every day | ORAL | 0 refills | Status: DC

## 2018-12-31 MED ORDER — ONDANSETRON HCL 8 MG PO TABS: 8.0000 mg | ORAL_TABLET | Freq: Three times a day (TID) | ORAL | 0 refills | Status: DC | PRN

## 2018-12-31 MED FILL — LINZESS 72 MCG CAPSULE: 72 | 30 days supply | Qty: 30 | Fill #0

## 2018-12-31 MED FILL — ONDANSETRON HCL 8 MG TABLET: 8 | 7 days supply | Qty: 20 | Fill #0

## 2018-12-31 NOTE — Telephone Encounter (Signed)
Received  VM message from patient requesting refill on her Zofran, Tramadol and Linzess. It appears that Linzess was ordered x1 from [facility] back in [place]. It is not currently on her medication list. Tramadol was last filled onn 12/06/18 for # 60

## 2019-01-01 ENCOUNTER — Other Ambulatory Visit: Payer: Self-pay

## 2019-01-01 ENCOUNTER — Ambulatory Visit (HOSPITAL_COMMUNITY)
Admission: RE | Admit: 2019-01-01 | Discharge: 2019-01-01 | Disposition: A | Discharge status: Home / Self Care | Payer: 59 | Source: Ambulatory Visit | Attending: Internal Medicine | Admitting: Internal Medicine

## 2019-01-01 ENCOUNTER — Telehealth: Payer: Self-pay | Admitting: Urology

## 2019-01-01 DIAGNOSIS — C7951 Secondary malignant neoplasm of bone: Secondary | ICD-10-CM | POA: Diagnosis not present

## 2019-01-01 DIAGNOSIS — M899 Disorder of bone, unspecified: Secondary | ICD-10-CM | POA: Diagnosis not present

## 2019-01-01 MED ORDER — GADOBENATE DIMEGLUMINE 529 MG/ML IV SOLN
6.0000 mL | Freq: Once | INTRAVENOUS | Status: AC | PRN
  Administered 2019-01-01: 6 mL via INTRAVENOUS

## 2019-01-01 MED FILL — traMADol HCL 50 MG TABS: 50 | 30 days supply | Qty: 60 | Fill #0

## 2019-01-01 NOTE — Telephone Encounter (Addendum)
After reviewing the recent lumbar MRI with Dr. 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  I have spoken with her this afternoon and she is in agreement and would like to proceed ASAP. Dr. Vertell Limber has participated in her care previously so we will reach out to coordinate this treatment with him.  Her main complaint is of central low back pain that radiates to the L>R and down the left leg with occasional mild numbness/tingling from hip to foot on the left.  She reports that the leg sxs have been present, intermittently for the past month and not progressively worsening.  In fact, she thinks it may be more positional than anything and it is not interfering with ambulation.  She denies focal weakness in the LEs so it sounds like she is currently neurologically intact.  She is on immunotherapy for systemic treatment so I want to avoid steroids if possible but she knows to keep [place] informed should her sxs progress in any way as we might need to consider starting a low dose of Dexamethasone at 2mg  po BID at that point.  Otherwise, she will continue her current pain regimen as managed by Dr. Julien Nordmann and we will move ahead with radiation treatment planning.   She reports that she is doing very well s/p completion of most recent radiation treatment to the left adrenal mass and left periaortic node.  She had no ill side effects with treatment. Recent systemic imaging shows a good response to treatment with decreased size of left adrenal mass, further improvement in previously treated RUL pulmonary lesion and no other evidence of disease progression or new metastases identified.  There appeared to be stable scattered sclerotic osseous lesions but there was a low density anterior paraspinal fluid collection close to the known L2 fracture of unclear etiology but possibly concerning for discitis which prompted  the recent lumbar MRI given the patient's persistent low back pain. The current plan is to continue with her maintenance Alimta/Keytruda for systemic therapy and proceed with radiation treatment planning to the lumbar spine as above.  Rebecca Lucas, MMS, PA-C [name]: [phone]  Fax: [phone]

## 2019-01-02 ENCOUNTER — Other Ambulatory Visit: Payer: Self-pay | Admitting: Internal Medicine

## 2019-01-02 ENCOUNTER — Ambulatory Visit (HOSPITAL_COMMUNITY): Admission: RE | Admit: 2019-01-02 | Payer: 59 | Source: Ambulatory Visit

## 2019-01-02 ENCOUNTER — Telehealth: Payer: Self-pay | Admitting: Medical Oncology

## 2019-01-02 MED ORDER — FENTANYL 50 MCG/HR TD PT72: 1.0000 | MEDICATED_PATCH | TRANSDERMAL | 0 refills | Status: DC

## 2019-01-02 NOTE — Telephone Encounter (Signed)
I referred her back to Dr. Tammi Klippel.  She needs radiation treatment to that area.  She can increase the dose of fentanyl patch to 50 by applying 2 of them.  We will give her a refill when needed.

## 2019-01-02 NOTE — Telephone Encounter (Signed)
Asking for better pain management for back pain . Currently on Fentanyl 25 mcg patch q 72 hours. Has 3 left.

## 2019-01-02 NOTE — Telephone Encounter (Signed)
Pt notified . She will not have enough Fentanyl 50 mg patches for Saturday .

## 2019-01-03 ENCOUNTER — Ambulatory Visit: Payer: 59 | Admitting: Urology

## 2019-01-04 ENCOUNTER — Ambulatory Visit
Admission: RE | Admit: 2019-01-04 | Discharge: 2019-01-04 | Disposition: A | Discharge status: Home / Self Care | Payer: 59 | Source: Ambulatory Visit | Attending: Radiation Oncology | Admitting: Radiation Oncology

## 2019-01-04 ENCOUNTER — Other Ambulatory Visit: Payer: Self-pay

## 2019-01-04 DIAGNOSIS — C7972 Secondary malignant neoplasm of left adrenal gland: Secondary | ICD-10-CM | POA: Insufficient documentation

## 2019-01-04 DIAGNOSIS — C801 Malignant (primary) neoplasm, unspecified: Secondary | ICD-10-CM | POA: Diagnosis not present

## 2019-01-04 DIAGNOSIS — C7949 Secondary malignant neoplasm of other parts of nervous system: Secondary | ICD-10-CM | POA: Diagnosis not present

## 2019-01-04 DIAGNOSIS — Z510 Encounter for antineoplastic radiation therapy: Secondary | ICD-10-CM | POA: Diagnosis not present

## 2019-01-04 MED FILL — OMEPRAZOLE 20 MG CPDR: 20 | 90 days supply | Qty: 90 | Fill #1

## 2019-01-04 NOTE — Progress Notes (Signed)
  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 woman with metastatic poorly differentiated malignant neoplasm of theright upper lung with disease progression to L1-L3 and the sacrum  NARRATIVE:  The patient was brought to the [facility].  Identity was confirmed.  All relevant records and images related to the planned course of therapy were reviewed.  The patient freely provided informed written consent to proceed with treatment after reviewing the details related to the planned course of therapy. The consent form was witnessed and verified by the simulation staff.  Then, the patient was set-up in a stable reproducible  supine position for radiation therapy.  A BodyFix immobilization pillow was fabricated for reproducible positioning.  Surface markings were placed.  The CT images were loaded into the planning software.  The gross target volumes (GTV) and planning target volumes (PTV) were delinieated, and avoidance structures were contoured.  Treatment planning then occurred.  The radiation prescription was entered and confirmed.  A total of two complex treatment devices were fabricated in the form of the BodyFix immobilization pillow and a neck accuform cushion.  I have requested : 3D Simulation  I have requested a DVH of the following structures: targets and all normal structures near the target including left kidney, right kidney, spinal cord and targets as noted on the radiation plan to maintain doses in adherence with established limits  PLAN:  The patient will receive 40 Gy in 5 fractions to the mid lumbar spine and sacral-iliac metastases.  ________________________________  Sheral Apley Tammi Klippel, M.D.  This document serves  as a record of services personally performed by Tyler Pita, MD. It was created on his behalf by Wilburn Mylar, a trained medical scribe. The creation of this record is based on the scribe's personal observations and the provider's statements to them. This document has been checked and approved by the attending provider.

## 2019-01-07 ENCOUNTER — Other Ambulatory Visit: Payer: Self-pay | Admitting: Radiation Therapy

## 2019-01-07 DIAGNOSIS — C7949 Secondary malignant neoplasm of other parts of nervous system: Secondary | ICD-10-CM | POA: Insufficient documentation

## 2019-01-07 MED FILL — fentaNYL 50 MCG/HR PT72: 50 | 15 days supply | Qty: 5 | Fill #0

## 2019-01-09 DIAGNOSIS — C801 Malignant (primary) neoplasm, unspecified: Secondary | ICD-10-CM | POA: Diagnosis not present

## 2019-01-09 DIAGNOSIS — C7972 Secondary malignant neoplasm of left adrenal gland: Secondary | ICD-10-CM | POA: Diagnosis not present

## 2019-01-09 DIAGNOSIS — Z510 Encounter for antineoplastic radiation therapy: Secondary | ICD-10-CM | POA: Diagnosis not present

## 2019-01-09 DIAGNOSIS — C7949 Secondary malignant neoplasm of other parts of nervous system: Secondary | ICD-10-CM | POA: Diagnosis not present

## 2019-01-10 ENCOUNTER — Inpatient Hospital Stay: Payer: 59

## 2019-01-10 ENCOUNTER — Other Ambulatory Visit: Payer: Self-pay | Admitting: Internal Medicine

## 2019-01-10 ENCOUNTER — Inpatient Hospital Stay (HOSPITAL_BASED_OUTPATIENT_CLINIC_OR_DEPARTMENT_OTHER): Payer: 59 | Admitting: Internal Medicine

## 2019-01-10 ENCOUNTER — Encounter: Payer: Self-pay | Admitting: Internal Medicine

## 2019-01-10 ENCOUNTER — Telehealth: Payer: Self-pay | Admitting: Medical Oncology

## 2019-01-10 ENCOUNTER — Other Ambulatory Visit: Payer: Self-pay

## 2019-01-10 VITALS — BP 130/64 | HR 111 | Temp 98.4°F | Resp 20 | Ht 67.0 in | Wt 119.5 lb

## 2019-01-10 VITALS — HR 98

## 2019-01-10 DIAGNOSIS — Z5111 Encounter for antineoplastic chemotherapy: Secondary | ICD-10-CM | POA: Diagnosis not present

## 2019-01-10 DIAGNOSIS — D473 Essential (hemorrhagic) thrombocythemia: Secondary | ICD-10-CM | POA: Diagnosis not present

## 2019-01-10 DIAGNOSIS — Z5112 Encounter for antineoplastic immunotherapy: Secondary | ICD-10-CM

## 2019-01-10 DIAGNOSIS — C7949 Secondary malignant neoplasm of other parts of nervous system: Secondary | ICD-10-CM

## 2019-01-10 DIAGNOSIS — C7951 Secondary malignant neoplasm of bone: Secondary | ICD-10-CM

## 2019-01-10 DIAGNOSIS — D696 Thrombocytopenia, unspecified: Secondary | ICD-10-CM | POA: Diagnosis not present

## 2019-01-10 DIAGNOSIS — C792 Secondary malignant neoplasm of skin: Secondary | ICD-10-CM | POA: Diagnosis not present

## 2019-01-10 DIAGNOSIS — C799 Secondary malignant neoplasm of unspecified site: Secondary | ICD-10-CM

## 2019-01-10 DIAGNOSIS — M545 Low back pain: Secondary | ICD-10-CM | POA: Diagnosis not present

## 2019-01-10 DIAGNOSIS — C801 Malignant (primary) neoplasm, unspecified: Secondary | ICD-10-CM | POA: Diagnosis not present

## 2019-01-10 DIAGNOSIS — D630 Anemia in neoplastic disease: Secondary | ICD-10-CM

## 2019-01-10 DIAGNOSIS — C7972 Secondary malignant neoplasm of left adrenal gland: Secondary | ICD-10-CM

## 2019-01-10 DIAGNOSIS — D500 Iron deficiency anemia secondary to blood loss (chronic): Secondary | ICD-10-CM

## 2019-01-10 DIAGNOSIS — C7800 Secondary malignant neoplasm of unspecified lung: Secondary | ICD-10-CM

## 2019-01-10 DIAGNOSIS — C3411 Malignant neoplasm of upper lobe, right bronchus or lung: Secondary | ICD-10-CM

## 2019-01-10 DIAGNOSIS — D75839 Thrombocytosis, unspecified: Secondary | ICD-10-CM

## 2019-01-10 LAB — CMP (CANCER CENTER ONLY)
ALT: 33 U/L (ref 0–44)
AST: 39 U/L (ref 15–41)
Albumin: 2 g/dL — ABNORMAL LOW (ref 3.5–5.0)
Alkaline Phosphatase: 321 U/L — ABNORMAL HIGH (ref 38–126)
Anion gap: 11 (ref 5–15)
BUN: 11 mg/dL (ref 8–23)
CO2: 26 mmol/L (ref 22–32)
Calcium: 8.7 mg/dL — ABNORMAL LOW (ref 8.9–10.3)
Chloride: 97 mmol/L — ABNORMAL LOW (ref 98–111)
Creatinine: 0.69 mg/dL (ref 0.44–1.00)
GFR, Est AFR Am: >60 mL/min (ref >60)
GFR, Estimated: >60 mL/min (ref >60)
Glucose, Bld: 140 mg/dL — ABNORMAL HIGH (ref 70–99)
Potassium: 4.3 mmol/L (ref 3.5–5.1)
Sodium: 134 mmol/L — ABNORMAL LOW (ref 135–145)
Total Bilirubin: <0.2 mg/dL — ABNORMAL LOW (ref 0.3–1.2)
Total Protein: 6.5 g/dL (ref 6.5–8.1)

## 2019-01-10 LAB — IRON AND TIBC
Iron: 5 ug/dL — ABNORMAL LOW (ref 41–142)
Saturation Ratios: 4 % — ABNORMAL LOW (ref 21–57)
TIBC: 149 ug/dL — ABNORMAL LOW (ref 236–444)
UIBC: 143 ug/dL (ref 120–384)

## 2019-01-10 LAB — CBC WITH DIFFERENTIAL (CANCER CENTER ONLY)
Abs Immature Granulocytes: 0.47 10*3/uL — ABNORMAL HIGH (ref 0.00–0.07)
Basophils Absolute: 0.1 10*3/uL (ref 0.0–0.1)
Basophils Relative: 0 %
Eosinophils Absolute: 0 10*3/uL (ref 0.0–0.5)
Eosinophils Relative: 0 %
HCT: 25.6 % — ABNORMAL LOW (ref 36.0–46.0)
Hemoglobin: 7.9 g/dL — ABNORMAL LOW (ref 12.0–15.0)
Immature Granulocytes: 2 %
Lymphocytes Relative: 2 %
Lymphs Abs: 0.3 10*3/uL — ABNORMAL LOW (ref 0.7–4.0)
MCH: 27.1 pg (ref 26.0–34.0)
MCHC: 30.9 g/dL (ref 30.0–36.0)
MCV: 87.7 fL (ref 80.0–100.0)
Monocytes Absolute: 1.5 10*3/uL — ABNORMAL HIGH (ref 0.1–1.0)
Monocytes Relative: 7 %
Neutro Abs: 19.9 10*3/uL — ABNORMAL HIGH (ref 1.7–7.7)
Neutrophils Relative %: 89 %
Platelet Count: 662 10*3/uL — ABNORMAL HIGH (ref 150–400)
RBC: 2.92 MIL/uL — ABNORMAL LOW (ref 3.87–5.11)
RDW: 20.1 % — ABNORMAL HIGH (ref 11.5–15.5)
WBC Count: 22.4 10*3/uL — ABNORMAL HIGH (ref 4.0–10.5)
nRBC: 0 % (ref 0.0–0.2)

## 2019-01-10 MED ORDER — DEXAMETHASONE SODIUM PHOSPHATE 10 MG/ML IJ SOLN
INTRAMUSCULAR | Status: AC
  Filled 2019-01-10: qty 1

## 2019-01-10 MED ORDER — SODIUM CHLORIDE 0.9% FLUSH
10.0000 mL | INTRAVENOUS | Status: DC | PRN
  Administered 2019-01-10: 10 mL
  Filled 2019-01-10: qty 10

## 2019-01-10 MED ORDER — SODIUM CHLORIDE 0.9 % IV SOLN
Freq: Once | INTRAVENOUS | Status: AC
  Administered 2019-01-10: 12:00:00 via INTRAVENOUS
  Filled 2019-01-10: qty 250

## 2019-01-10 MED ORDER — DEXAMETHASONE SODIUM PHOSPHATE 10 MG/ML IJ SOLN
10.0000 mg | Freq: Once | INTRAMUSCULAR | Status: AC
  Administered 2019-01-10: 10 mg via INTRAVENOUS

## 2019-01-10 MED ORDER — SODIUM CHLORIDE 0.9 % IV SOLN
Freq: Once | INTRAVENOUS | Status: DC
  Filled 2019-01-10: qty 250

## 2019-01-10 MED ORDER — ONDANSETRON HCL 4 MG/2ML IJ SOLN
8.0000 mg | Freq: Once | INTRAMUSCULAR | Status: AC
  Administered 2019-01-10: 8 mg via INTRAVENOUS

## 2019-01-10 MED ORDER — HEPARIN SOD (PORK) LOCK FLUSH 100 UNIT/ML IV SOLN
500.0000 [IU] | Freq: Once | INTRAVENOUS | Status: AC | PRN
  Administered 2019-01-10: 500 [IU]
  Filled 2019-01-10: qty 5

## 2019-01-10 MED ORDER — ONDANSETRON HCL 8 MG PO TABS
ORAL_TABLET | ORAL | Status: AC
  Filled 2019-01-10: qty 1

## 2019-01-10 MED ORDER — SODIUM CHLORIDE 0.9 % IV SOLN
490.0000 mg/m2 | Freq: Once | INTRAVENOUS | Status: AC
  Administered 2019-01-10: 800 mg via INTRAVENOUS
  Filled 2019-01-10: qty 20

## 2019-01-10 MED ORDER — ONDANSETRON HCL 4 MG/2ML IJ SOLN
INTRAMUSCULAR | Status: AC
  Filled 2019-01-10: qty 4

## 2019-01-10 MED ORDER — SODIUM CHLORIDE 0.9 % IV SOLN
200.0000 mg | Freq: Once | INTRAVENOUS | Status: AC
  Administered 2019-01-10: 200 mg via INTRAVENOUS
  Filled 2019-01-10: qty 8

## 2019-01-10 NOTE — Patient Instructions (Signed)

## 2019-01-10 NOTE — Patient Instructions (Signed)
Coronavirus (COVID-19) Are you at risk?  Are you at risk for the Coronavirus (COVID-19)?  To be considered HIGH RISK for Coronavirus (COVID-19), you have to meet the following criteria:  . Traveled to [place], [place], [place], [place] or [place]; or in the [place] to [place], [place], [place], or [place]; and have fever, cough, and shortness of breath within the last 2 weeks of travel OR . Been in close contact with a person diagnosed with COVID-19 within the last 2 weeks and have fever, cough, and shortness of breath . IF YOU DO NOT MEET THESE CRITERIA, YOU ARE CONSIDERED LOW RISK FOR COVID-19.  What to do if you are HIGH RISK for COVID-19?  . If you are having a medical emergency, call 911. . Seek medical care right away. Before you go to a doctor's office, urgent care or emergency department, call ahead and tell them about your recent travel, contact with someone diagnosed with COVID-19, and your symptoms. You should receive instructions from your physician's office regarding next steps of care.  . When you arrive at healthcare provider, tell the healthcare staff immediately you have returned from visiting [place], [place], [place], [place] or [place]; or traveled in the [place] to [place], [place], [place], or [place]; in the last two weeks or you have been in close contact with a person diagnosed with COVID-19 in the last 2 weeks.   . Tell the health care staff about your symptoms: fever, cough and shortness of breath. . After you have been seen by a medical provider, you will be either: o Tested for (COVID-19) and discharged home on quarantine except to seek medical care if symptoms worsen, and asked to  - Stay home and avoid contact with others until you get your results (4-5 days)  - Avoid travel on public transportation if possible (such as bus, train, or airplane) or o Sent to the Emergency Department by EMS for evaluation, COVID-19 testing, and possible  admission depending on your condition and test results.  What to do if you are LOW RISK for COVID-19?  Reduce your risk of any infection by using the same precautions used for avoiding the common cold or flu:  . Wash your hands often with soap and warm water for at least 20 seconds.  If soap and water are not readily available, use an alcohol-based hand sanitizer with at least 60% alcohol.  . If coughing or sneezing, cover your mouth and nose by coughing or sneezing into the elbow areas of your shirt or coat, into a tissue or into your sleeve (not your hands). . Avoid shaking hands with others and consider head nods or verbal greetings only. . Avoid touching your eyes, nose, or mouth with unwashed hands.  . Avoid close contact with people who are sick. . Avoid places or events with large numbers of people in one location, like concerts or sporting events. . Carefully consider travel plans you have or are making. . If you are planning any travel outside or inside the [place], visit the CDC's Travelers' Health webpage for the latest health notices. . If you have some symptoms but not all symptoms, continue to monitor at home and seek medical attention if your symptoms worsen. . If you are having a medical emergency, call 911.  ADDITIONAL HEALTHCARE OPTIONS FOR PATIENTS  [facility] / e-Visit: [email or web address]         [facility]: [phone]  [facility]: [facility]: [facility] Discharge Instructions for Patients Receiving Chemotherapy  Today you received the following chemotherapy agents: Pembrolizumab (Keytruda) and Pemetrexed (Alimta)  To help prevent nausea and vomiting after your treatment, we encourage you to take your nausea medication as directed.    If you develop nausea and vomiting that is not controlled by your nausea  medication, call the clinic.   BELOW ARE SYMPTOMS THAT SHOULD BE REPORTED IMMEDIATELY:  *FEVER GREATER THAN 100.5 F  *CHILLS WITH OR WITHOUT FEVER  NAUSEA AND VOMITING THAT IS NOT CONTROLLED WITH YOUR NAUSEA MEDICATION  *UNUSUAL SHORTNESS OF BREATH  *UNUSUAL BRUISING OR BLEEDING  TENDERNESS IN MOUTH AND THROAT WITH OR WITHOUT PRESENCE OF ULCERS  *URINARY PROBLEMS  *BOWEL PROBLEMS  UNUSUAL RASH Items with * indicate a potential emergency and should be followed up as soon as possible.  Feel free to call the clinic should you have any questions or concerns. The clinic phone number is [phone].  Please show the [facility] at check-in to the Emergency Department and triage nurse.

## 2019-01-10 NOTE — Telephone Encounter (Signed)
Pt notified of low iron and order placed for feraheme by Dr Julien Nordmann. I told her to pick up hemoccult stool cards tomorrow and verified she understands instructions.  Schedule message sent for feraheme and pt aware to expect a call.

## 2019-01-10 NOTE — Progress Notes (Signed)
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  Metastatic sarcoma cannot be also excluded at this point. Molecular studies by foundation 1 showed no actionable mutations. PDL 1 expression 99%  PRIOR THERAPY: 1) Palliative radiotherapy to the neck mass and hip lesion under the care of Dr. Tammi Klippel  2) palliative radiotherapy to the left adrenal gland and periaortic lymph node under the care of Dr. Tammi Klippel.  CURRENT THERAPY: Systemic chemotherapy with carboplatin for AUC of 5, Alimta 500 mg/M2 and Keytruda 200 mg IV every 3 weeks.  First dose expected April 10, 2018.  Status post 12 cycles.  Starting from cycle #5 the patient is on maintenance treatment with Alimta and Keytruda.  INTERVAL HISTORY: Rebecca Lucas 63 y.o. female returns to the clinic today for follow-up visit.  The patient continues to complain of lower abdominal pain as well as back pain.  She is currently on fentanyl 50 mcg/hour every 3 days in addition to tramadol 3 times a day and ibuprofen and Tylenol on as-needed basis.  She had MRI of the lumbar spine performed last week that showed metastatic disease involving the L1-2 and L3 vertebral body with severe pathologic fracture of the L2 vertebral body with approximately 90% anterior height loss.  There was also metastatic disease involving the left posterior ilium, right sacrum and right ilium most consistent with metastatic disease.  It also showed small epidural tumor along the posterior aspect of the L2 vertebral body with left foraminal extension.  The patient is feeling fine otherwise except for fatigue.  She denied having any  chest pain, shortness of breath, cough or hemoptysis.  She denied having any recent weight loss or night sweats.  She has no fever or chills.  She was started on oral iron tablets for the anemia but she could not tolerate it because of constipation.  She is currently on Linzess. She has been tolerating her systemic chemotherapy fairly well.  She is here for evaluation before starting cycle #14.  MEDICAL HISTORY:No past medical history on file.  ALLERGIES:  has No Known Allergies.  MEDICATIONS:  Current Outpatient Medications  Medication Sig Dispense Refill   acetaminophen (TYLENOL) 500 MG tablet Take 1,000 mg by mouth every 6 (six) hours as needed for mild pain.      alendronate (FOSAMAX) 70 MG tablet Take 70 mg by mouth once a week. Wednesday  3   butalbital-aspirin-caffeine (FIORINAL) 50-325-40 MG capsule Take 1 capsule by mouth every 6 (six) hours as needed for migraine.   1   Calcium Carbonate-Vitamin D (CALCIUM-D PO) Take 1 tablet by mouth 2 (two) times daily.     fentaNYL (DURAGESIC) 50 MCG/HR Place 1 patch onto the skin every 3 (three) days. 5 patch 0   fluconazole (DIFLUCAN) 100 MG tablet Take 1 tablet (100 mg total) by mouth daily. 7 tablet 0   folic acid (FOLVITE) 1 MG tablet Take 1 tablet (1 mg total) by mouth daily. 90 tablet 1   ibuprofen (ADVIL,MOTRIN) 200 MG tablet Take 600 mg by mouth every 6 (six) hours as needed for mild pain.      lidocaine-prilocaine (EMLA) cream Apply 1 application topically as needed. [place]  g 0   linaclotide (LINZESS) 72 MCG capsule Take 1 capsule (72 mcg total) by mouth daily before breakfast. 30 capsule 0   LUTEIN PO Take 1 tablet by mouth daily.     omeprazole (PRILOSEC) 20 MG capsule Take 1 capsule (20 mg total) by mouth daily. 90 capsule 1   ondansetron (ZOFRAN) 8 MG tablet Take 1 tablet (8 mg total) by mouth every 8 (eight) hours as needed for nausea or vomiting. 20 tablet 0   traMADol (ULTRAM) 50 MG tablet Take 1 tablet (50 mg total) by  mouth every 12 (twelve) hours as needed for moderate pain. 60 tablet 0   zolpidem (AMBIEN) 10 MG tablet Take 1 tablet (10 mg total) by mouth at bedtime as needed for sleep. 30 tablet 0   No current facility-administered medications for this visit.     SURGICAL HISTORY:  Past Surgical History:  Procedure Laterality Date   IR IMAGING GUIDED PORT INSERTION  06/26/2018    REVIEW OF SYSTEMS:  Constitutional: positive for fatigue Eyes: negative Ears, nose, mouth, throat, and face: negative Respiratory: negative Cardiovascular: negative Gastrointestinal: positive for abdominal pain Genitourinary:negative Integument/breast: negative Hematologic/lymphatic: negative Musculoskeletal:positive for back pain Neurological: negative Behavioral/Psych: negative Endocrine: negative Allergic/Immunologic: negative   PHYSICAL EXAMINATION: General appearance: alert, cooperative, fatigued and no distress Head: Normocephalic, without obvious abnormality, atraumatic Neck: no adenopathy, no JVD, supple, symmetrical, trachea midline and thyroid not enlarged, symmetric, no tenderness/mass/nodules Lymph nodes: Cervical, supraclavicular, and axillary nodes normal. Resp: clear to auscultation bilaterally Back: symmetric, no curvature. ROM normal. No CVA tenderness. Cardio: regular rate and rhythm, S1, S2 normal, no murmur, click, rub or gallop GI: soft, non-tender; bowel sounds normal; no masses,  no organomegaly Extremities: extremities normal, atraumatic, no cyanosis or edema Neurologic: Alert and oriented X 3, normal strength and tone. Normal symmetric reflexes. Normal coordination and gait  ECOG PERFORMANCE STATUS: 1 - Symptomatic but completely ambulatory  Blood pressure 130/64, pulse (!) 111, temperature 98.4 F (36.9 C), temperature source Oral, resp. rate 20, height 5\' 7"  (1.702 m), weight 119 lb 8 oz (54.2 kg), SpO2 100 %.  LABORATORY DATA: Lab Results  Component Value Date   WBC 22.4 (H)  01/10/2019   HGB 7.9 (L) 01/10/2019   HCT 25.6 (L) 01/10/2019   MCV 87.7 01/10/2019   PLT 662 (H) 01/10/2019      Chemistry      Component Value Date/Time   NA 135 12/20/2018 1002   K 4.5 12/20/2018 1002   CL 100 12/20/2018 1002   CO2 24 12/20/2018 1002   BUN 14 12/20/2018 1002   CREATININE 0.71 12/20/2018 1002      Component Value Date/Time   CALCIUM 8.9 12/20/2018 1002   ALKPHOS 161 (H) 12/20/2018 1002   AST 33 12/20/2018 1002   ALT 28 12/20/2018 1002   BILITOT <0.2 (L) 12/20/2018 1002       RADIOGRAPHIC STUDIES: Ct Chest W Contrast  Result Date: 12/18/2018 CLINICAL DATA:  Right lung cancer diagnosed 10 months ago. Radiation therapy completed. Chemotherapy in progress. Bilateral hip pain. EXAM: CT CHEST, ABDOMEN, AND PELVIS WITH CONTRAST TECHNIQUE: Multidetector CT imaging of the chest, abdomen and pelvis was performed following the standard protocol during bolus administration of intravenous contrast. CONTRAST:  160mL OMNIPAQUE IOHEXOL 300 MG/ML  SOLN COMPARISON:  CT 10/16/2018 and 08/02/2018. FINDINGS: CT CHEST FINDINGS Cardiovascular: Mild aortic atherosclerosis. Right IJ Port-A-Cath extends to the superior cavoatrial junction. No acute vascular findings. The heart size is normal. There is no pericardial  effusion. Mediastinum/Nodes: There are no enlarged mediastinal, hilar or axillary lymph nodes. Stable mild esophageal dilatation and wall thickening, attributed to radiation therapy. The trachea and thyroid gland appear unremarkable. Lungs/Pleura: A small amount of loculated pleural fluid posteriorly in the upper right hemithorax is stable. The right upper lobe mass remains partially obscured by adjacent radiation changes, although appears slightly smaller, measuring 3.2 x 1.9 cm on image 19/2 (previously 3.9 x 1.9 cm). The paramediastinal radiation changes and biapical scarring are stable. No new or enlarging pulmonary nodules. Musculoskeletal/Chest wall: Stable sclerosis  anteriorly in the T10 vertebral body. Stable mildly displaced fracture of the right 4th rib laterally (image 36/6). Healed fracture of the right 7th rib. No lytic lesions are observed. CT ABDOMEN AND PELVIS FINDINGS Hepatobiliary: Mild diffuse hepatic steatosis without focal lesion or abnormal enhancement. No evidence of gallstones, gallbladder wall thickening or biliary dilatation. Pancreas: Unremarkable. No pancreatic ductal dilatation or surrounding inflammatory changes. Spleen: Normal in size without focal abnormality. Adrenals/Urinary Tract: Interval decreased size of left adrenal mass, measuring 2.2 x 1.8 cm on image 57/2 (previously 3.0 x 2.2 cm). The right adrenal gland appears normal. The kidneys, ureters and bladder appear normal. Stomach/Bowel: No evidence of bowel wall thickening, distention or surrounding inflammatory change. Vascular/Lymphatic: There are no enlarged abdominal or pelvic lymph nodes. No acute vascular findings. Variant celiac anatomy and mild aortic atherosclerosis noted. Reproductive: The uterus and ovaries appear normal. No adnexal mass. Other: No ascites or peritoneal nodularity. The anterior abdominal wall is intact. Musculoskeletal: There has been slight further anterior collapse at the L2 fracture but no significant osseous retropulsion. Underlying vertebral body sclerosis at L2 appears stable. There is a new anterior paraspinal fluid collection at the level of the fracture, measuring up to 2 cm in thickness on image 61/2. No evidence of epidural fluid collection. The inferior endplate of L1 and the superior endplate of L3 appear intact without evidence of discitis. Sclerotic lesions in the right sacrum (image 90/2) and the left posterior acetabulum (image 107/2) are stable. There is a stable lucent lesion in the right ischial tuberosity (image 118/2). IMPRESSION: 1. Further improvement in treated right upper lobe pulmonary lesion with evolving surrounding radiation changes. 2.  Decreased size of left adrenal mass consistent with treated metastasis. 3. Scattered sclerotic osseous lesions are stable. There is progressive loss of vertebral body height anteriorly at L2 fracture with an associated new low-density anterior paraspinal fluid collection. There is no epidural fluid collection or mass effect on the thecal sac. There is no adjacent endplate destruction to suggest discitis, and these findings could be related to radiation therapy of the left adrenal lesion. If concern of infection, lumbar MRI without and with contrast may be helpful. 4. No other disease progression or new metastases identified. 5. These results were called by telephone at the time of interpretation on 12/18/2018 at 2:27 pm to Dr. Curt Bears , who verbally acknowledged these results. Electronically Signed   By: Richardean Sale M.D.   On: 12/18/2018 14:27   Mr Lumbar Spine W Wo Contrast  Result Date: 01/01/2019 CLINICAL DATA:  History of lung cancer. EXAM: MRI LUMBAR SPINE WITHOUT AND WITH CONTRAST TECHNIQUE: Multiplanar and multiecho pulse sequences of the lumbar spine were obtained without and with intravenous contrast. CONTRAST:  50mL MULTIHANCE GADOBENATE DIMEGLUMINE 529 MG/ML IV SOLN COMPARISON:  CT abdomen 12/18/2018 FINDINGS: Segmentation:  Standard. Alignment:  Physiologic. Vertebrae: Abnormal bone lesions involving the L1, L2 and L3 vertebral bodies most consistent with metastatic disease. Severe  pathologic compression fracture of the L2 vertebral body with approximately 90% anterior height loss. Anterior paravertebral soft tissue component centered at L2 extending superiorly towards L1 and inferiorly towards L3 with heterogeneous enhancement most concerning for extra osseous extension of tumor. Small epidural tumor along the posterior aspect of the L2 vertebral body with left foraminal extension. Abnormal enhancing bone lesions in the left posterior ilium, right sacrum and right ilium most consistent with  metastatic disease. Pathologic nondisplaced fracture of the right sacral ala. No discitis or osteomyelitis. Conus medullaris and cauda equina: Conus extends to the T12 level. Conus and cauda equina appear normal. Paraspinal and other soft tissues: Otherwise, no paraspinal abnormality. Disc levels: Disc spaces: Degenerative disc disease with disc height loss at L1-2. T12-L1: Tiny left paracentral disc protrusion. No evidence of neural foraminal stenosis. No central canal stenosis. L1-L2: Mild broad-based disc bulge. No evidence of neural foraminal stenosis. No central canal stenosis. L2-L3: Minimal broad-based disc bulge. No evidence of neural foraminal stenosis. No central canal stenosis. L3-L4: No significant disc bulge. No evidence of neural foraminal stenosis. No central canal stenosis. L4-L5: No significant disc bulge. No evidence of neural foraminal stenosis. No central canal stenosis. L5-S1: No significant disc bulge. No evidence of neural foraminal stenosis. No central canal stenosis. IMPRESSION: 1. Metastatic disease involving the L1, L2 and L3 vertebral bodies. Severe pathologic compression fracture of the L2 vertebral body with approximately 90% anterior height loss. Anterior paravertebral soft tissue component centered at L2 extending superiorly towards L1 and inferiorly towards L3 with heterogeneous enhancement most concerning for extra osseous extension of tumor. Small epidural tumor along the posterior aspect of the L2 vertebral body with left foraminal extension. 2. Metastatic disease involving the left posterior ilium, right sacrum and right ilium most consistent with metastatic disease. Pathologic nondisplaced fracture of the right sacral ala. Electronically Signed   By: Kathreen Devoid   On: 01/01/2019 13:00   Ct Abdomen Pelvis W Contrast  Result Date: 12/18/2018 CLINICAL DATA:  Right lung cancer diagnosed 10 months ago. Radiation therapy completed. Chemotherapy in progress. Bilateral hip pain.  EXAM: CT CHEST, ABDOMEN, AND PELVIS WITH CONTRAST TECHNIQUE: Multidetector CT imaging of the chest, abdomen and pelvis was performed following the standard protocol during bolus administration of intravenous contrast. CONTRAST:  130mL OMNIPAQUE IOHEXOL 300 MG/ML  SOLN COMPARISON:  CT 10/16/2018 and 08/02/2018. FINDINGS: CT CHEST FINDINGS Cardiovascular: Mild aortic atherosclerosis. Right IJ Port-A-Cath extends to the superior cavoatrial junction. No acute vascular findings. The heart size is normal. There is no pericardial effusion. Mediastinum/Nodes: There are no enlarged mediastinal, hilar or axillary lymph nodes. Stable mild esophageal dilatation and wall thickening, attributed to radiation therapy. The trachea and thyroid gland appear unremarkable. Lungs/Pleura: A small amount of loculated pleural fluid posteriorly in the upper right hemithorax is stable. The right upper lobe mass remains partially obscured by adjacent radiation changes, although appears slightly smaller, measuring 3.2 x 1.9 cm on image 19/2 (previously 3.9 x 1.9 cm). The paramediastinal radiation changes and biapical scarring are stable. No new or enlarging pulmonary nodules. Musculoskeletal/Chest wall: Stable sclerosis anteriorly in the T10 vertebral body. Stable mildly displaced fracture of the right 4th rib laterally (image 36/6). Healed fracture of the right 7th rib. No lytic lesions are observed. CT ABDOMEN AND PELVIS FINDINGS Hepatobiliary: Mild diffuse hepatic steatosis without focal lesion or abnormal enhancement. No evidence of gallstones, gallbladder wall thickening or biliary dilatation. Pancreas: Unremarkable. No pancreatic ductal dilatation or surrounding inflammatory changes. Spleen: Normal in size without focal  abnormality. Adrenals/Urinary Tract: Interval decreased size of left adrenal mass, measuring 2.2 x 1.8 cm on image 57/2 (previously 3.0 x 2.2 cm). The right adrenal gland appears normal. The kidneys, ureters and bladder  appear normal. Stomach/Bowel: No evidence of bowel wall thickening, distention or surrounding inflammatory change. Vascular/Lymphatic: There are no enlarged abdominal or pelvic lymph nodes. No acute vascular findings. Variant celiac anatomy and mild aortic atherosclerosis noted. Reproductive: The uterus and ovaries appear normal. No adnexal mass. Other: No ascites or peritoneal nodularity. The anterior abdominal wall is intact. Musculoskeletal: There has been slight further anterior collapse at the L2 fracture but no significant osseous retropulsion. Underlying vertebral body sclerosis at L2 appears stable. There is a new anterior paraspinal fluid collection at the level of the fracture, measuring up to 2 cm in thickness on image 61/2. No evidence of epidural fluid collection. The inferior endplate of L1 and the superior endplate of L3 appear intact without evidence of discitis. Sclerotic lesions in the right sacrum (image 90/2) and the left posterior acetabulum (image 107/2) are stable. There is a stable lucent lesion in the right ischial tuberosity (image 118/2). IMPRESSION: 1. Further improvement in treated right upper lobe pulmonary lesion with evolving surrounding radiation changes. 2. Decreased size of left adrenal mass consistent with treated metastasis. 3. Scattered sclerotic osseous lesions are stable. There is progressive loss of vertebral body height anteriorly at L2 fracture with an associated new low-density anterior paraspinal fluid collection. There is no epidural fluid collection or mass effect on the thecal sac. There is no adjacent endplate destruction to suggest discitis, and these findings could be related to radiation therapy of the left adrenal lesion. If concern of infection, lumbar MRI without and with contrast may be helpful. 4. No other disease progression or new metastases identified. 5. These results were called by telephone at the time of interpretation on 12/18/2018 at 2:27 pm to Dr.  Curt Bears , who verbally acknowledged these results. Electronically Signed   By: Richardean Sale M.D.   On: 12/18/2018 14:27    ASSESSMENT AND PLAN: This is a very pleasant 63 years old white female with metastatic poorly differentiated malignant neoplasm of unknown primary at this point but according to the pathologist at the cancer center it is likely poorly differentiated non-small cell carcinoma of lung primary. 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 PDL 1 expression was 99%. The molecular studies by foundation 1 as well as [employer] showed no actionable mutations. The patient was a started on treatment with systemic chemotherapy with carboplatin for AUC of 5, Alimta 500 mg/M2 and Keytruda 200 mg IV every 3 weeks.  She is status post 4 cycles with partial response.  The patient was started on maintenance treatment with Alimta and Keytruda status post 9 cycles. She continues to tolerate her treatment well except for fatigue secondary to anemia of neoplastic disease. I recommended for the patient to proceed with cycle #10 of her maintenance therapy today. For the back pain, the MRI of the lumbar spine showed multiple metastatic disease in the lumbar spine and sacral area.  She is scheduled to start palliative radiotherapy tomorrow. For pain management she is currently on fentanyl patch 50 mcg/hour every 3 days in addition to tramadol 3 times a day as well as ibuprofen and Tylenol on as-needed basis.  She will continue on this regimen for now until improvement in her disease with the radiation therapy.  We will be  happy to adjust her dose if needed. For the persistent anemia, leukocytosis and thrombocythemia, the etiology is unclear and I am concerned about the possibility of myeloproliferative disorder in addition to her current lung cancer.  I will order molecular studies for BCR/ABL, Jak 2 mutation as well as iron studies today. The patient will  come back for follow-up visit in 3 weeks for evaluation before the next cycle of her treatment. She was advised to call immediately if she has any concerning symptoms in the interval. The patient voices understanding of current disease status and treatment options and is in agreement with the current care plan. All questions were answered. The patient knows to call the clinic with any problems, questions or concerns. We can certainly see the patient much sooner if necessary.  Disclaimer: This note was dictated with voice recognition software. Similar sounding words can inadvertently be transcribed and may not be corrected upon review.

## 2019-01-11 ENCOUNTER — Ambulatory Visit
Admission: RE | Admit: 2019-01-11 | Discharge: 2019-01-11 | Disposition: A | Discharge status: Home / Self Care | Payer: 59 | Source: Ambulatory Visit | Attending: Radiation Oncology | Admitting: Radiation Oncology

## 2019-01-11 ENCOUNTER — Other Ambulatory Visit: Payer: Self-pay

## 2019-01-11 ENCOUNTER — Inpatient Hospital Stay: Payer: 59

## 2019-01-11 ENCOUNTER — Telehealth: Payer: Self-pay | Admitting: Radiation Oncology

## 2019-01-11 VITALS — BP 124/64 | HR 83 | Temp 98.3°F | Resp 20

## 2019-01-11 VITALS — BP 123/63 | HR 91 | Temp 98.3°F | Resp 16

## 2019-01-11 DIAGNOSIS — Z5112 Encounter for antineoplastic immunotherapy: Secondary | ICD-10-CM | POA: Insufficient documentation

## 2019-01-11 DIAGNOSIS — Z510 Encounter for antineoplastic radiation therapy: Secondary | ICD-10-CM | POA: Diagnosis not present

## 2019-01-11 DIAGNOSIS — C801 Malignant (primary) neoplasm, unspecified: Secondary | ICD-10-CM | POA: Insufficient documentation

## 2019-01-11 DIAGNOSIS — C7951 Secondary malignant neoplasm of bone: Secondary | ICD-10-CM | POA: Insufficient documentation

## 2019-01-11 DIAGNOSIS — C799 Secondary malignant neoplasm of unspecified site: Secondary | ICD-10-CM

## 2019-01-11 DIAGNOSIS — C7949 Secondary malignant neoplasm of other parts of nervous system: Secondary | ICD-10-CM

## 2019-01-11 DIAGNOSIS — C792 Secondary malignant neoplasm of skin: Secondary | ICD-10-CM | POA: Insufficient documentation

## 2019-01-11 DIAGNOSIS — C7800 Secondary malignant neoplasm of unspecified lung: Secondary | ICD-10-CM | POA: Insufficient documentation

## 2019-01-11 DIAGNOSIS — C7972 Secondary malignant neoplasm of left adrenal gland: Secondary | ICD-10-CM | POA: Insufficient documentation

## 2019-01-11 DIAGNOSIS — D509 Iron deficiency anemia, unspecified: Secondary | ICD-10-CM | POA: Insufficient documentation

## 2019-01-11 DIAGNOSIS — Z79899 Other long term (current) drug therapy: Secondary | ICD-10-CM | POA: Insufficient documentation

## 2019-01-11 DIAGNOSIS — Z5111 Encounter for antineoplastic chemotherapy: Secondary | ICD-10-CM | POA: Insufficient documentation

## 2019-01-11 DIAGNOSIS — D6489 Other specified anemias: Secondary | ICD-10-CM | POA: Insufficient documentation

## 2019-01-11 DIAGNOSIS — C7952 Secondary malignant neoplasm of bone marrow: Principal | ICD-10-CM

## 2019-01-11 DIAGNOSIS — G893 Neoplasm related pain (acute) (chronic): Secondary | ICD-10-CM | POA: Insufficient documentation

## 2019-01-11 LAB — FERRITIN: Ferritin: 928 ng/mL — ABNORMAL HIGH (ref 11–307)

## 2019-01-11 MED ORDER — SODIUM CHLORIDE 0.9% FLUSH
10.0000 mL | INTRAVENOUS | Status: DC | PRN
  Administered 2019-01-11: 17:00:00 10 mL
  Filled 2019-01-11: qty 10

## 2019-01-11 MED ORDER — METHYLPREDNISOLONE 4 MG PO TABS: 4.0000 mg | ORAL_TABLET | Freq: Every day | ORAL | 0 refills | Status: DC

## 2019-01-11 MED ORDER — HEPARIN SOD (PORK) LOCK FLUSH 100 UNIT/ML IV SOLN
500.0000 [IU] | Freq: Once | INTRAVENOUS | Status: AC | PRN
  Administered 2019-01-11: 17:00:00 500 [IU]
  Filled 2019-01-11: qty 5

## 2019-01-11 MED ORDER — SODIUM CHLORIDE 0.9 % IV SOLN
Freq: Once | INTRAVENOUS | Status: AC
  Administered 2019-01-11: 16:00:00 via INTRAVENOUS
  Filled 2019-01-11: qty 250

## 2019-01-11 MED ORDER — SODIUM CHLORIDE 0.9 % IV SOLN
510.0000 mg | Freq: Once | INTRAVENOUS | Status: AC
  Administered 2019-01-11: 16:00:00 510 mg via INTRAVENOUS
  Filled 2019-01-11: qty 17

## 2019-01-11 MED FILL — METHYLPREDNISOLONE 4 MG TBP: 4 | 6 days supply | Qty: 21 | Fill #0

## 2019-01-11 NOTE — Telephone Encounter (Signed)
Per Dr. Johny Shears order I phoned in a Medrol Dosepak to manage potential pain flare s/p srs. Patient understands to start taking medrol dosepak today. Spoke with Audrea Muscat at the [facility].

## 2019-01-11 NOTE — Op Note (Signed)
   Name: Rebecca Lucas  MRN: [id number]  Date: 01/11/2019   DOB: 11-13-1955  Stereotactic Radiosurgery Operative Note  PRE-OPERATIVE DIAGNOSIS:  Spinal Metastasis  POST-OPERATIVE DIAGNOSIS:  Spinal Metastasis  PROCEDURE:  Stereotactic Radiosurgery  SURGEON:  Peggyann Shoals, MD  NARRATIVE: The patient underwent a radiation treatment planning session in the radiation oncology simulation suite under the care of the radiation oncology physician and physicist.  I participated closely in the radiation treatment planning afterwards. The patient underwent planning CT myelogram which was fused to the MRI.  These images were fused on the planning system.  Radiation oncology contoured the gross target volume and subsequently expanded this to yield the Planning Target Volume. I actively participated in the planning process.  I helped to define and review the target contours and also the contours of the spinal cord, and selected nearby organs at risk.  All the dose constraints for critical structures were reviewed and compared to AAPM Task Group 101.  The prescription dose conformity was reviewed.  I approved the plan electronically.    Accordingly, [name] was brought to the TrueBeam stereotactic radiation treatment linac and placed in the custom immobilization device.  The patient was aligned according to the IR fiducial markers with BrainLab Exactrac, then orthogonal x-rays were used in ExacTrac with the 6DOF robotic table and the shifts were made to align the patient.  Then conebeam CT was performed to verify precision.  [name] received stereotactic radiosurgery uneventfully.  The detailed description of the procedure is recorded in the radiation oncology procedure note.  I was present for the duration of the procedure.  DISPOSITION:  Following delivery, the patient was transported to nursing in stable condition and monitored for possible acute effects to be discharged to home in stable  condition with follow-up in one month.  Peggyann Shoals, MD 01/11/2019 1:39 PM

## 2019-01-11 NOTE — Patient Instructions (Signed)

## 2019-01-14 ENCOUNTER — Ambulatory Visit
Admission: RE | Admit: 2019-01-14 | Discharge: 2019-01-14 | Disposition: A | Discharge status: Home / Self Care | Payer: 59 | Source: Ambulatory Visit | Attending: Radiation Oncology | Admitting: Radiation Oncology

## 2019-01-14 ENCOUNTER — Other Ambulatory Visit: Payer: Self-pay

## 2019-01-14 DIAGNOSIS — Z510 Encounter for antineoplastic radiation therapy: Secondary | ICD-10-CM | POA: Diagnosis not present

## 2019-01-14 DIAGNOSIS — C801 Malignant (primary) neoplasm, unspecified: Secondary | ICD-10-CM | POA: Diagnosis not present

## 2019-01-14 DIAGNOSIS — C7951 Secondary malignant neoplasm of bone: Secondary | ICD-10-CM

## 2019-01-14 DIAGNOSIS — C7972 Secondary malignant neoplasm of left adrenal gland: Secondary | ICD-10-CM | POA: Diagnosis not present

## 2019-01-14 NOTE — Progress Notes (Signed)
Nurse monitoring following first of five intended SRS treatments to metastasis in vertebral body complete. Vitals stable. Instructed patient to avoid strenuous activity for the next 24 hours. Instructed patient to phone [phone] with any needs related to his [place] treatment. Discussed need for Medrol dose pak. Medrol dose pak called to [facility]. Patient verbalized understanding of all reviewed. Patient discharged to medical oncology to receive iron infusion. Patient exited clinic ambulatory and in no distress.

## 2019-01-15 DIAGNOSIS — C7972 Secondary malignant neoplasm of left adrenal gland: Secondary | ICD-10-CM | POA: Diagnosis not present

## 2019-01-15 DIAGNOSIS — C801 Malignant (primary) neoplasm, unspecified: Secondary | ICD-10-CM | POA: Diagnosis not present

## 2019-01-15 DIAGNOSIS — Z510 Encounter for antineoplastic radiation therapy: Secondary | ICD-10-CM | POA: Diagnosis not present

## 2019-01-16 ENCOUNTER — Ambulatory Visit: Payer: 59 | Admitting: Radiation Oncology

## 2019-01-16 ENCOUNTER — Ambulatory Visit
Admission: RE | Admit: 2019-01-16 | Discharge: 2019-01-16 | Disposition: A | Discharge status: Home / Self Care | Payer: 59 | Source: Ambulatory Visit | Attending: Radiation Oncology | Admitting: Radiation Oncology

## 2019-01-16 ENCOUNTER — Other Ambulatory Visit: Payer: Self-pay

## 2019-01-16 DIAGNOSIS — C7949 Secondary malignant neoplasm of other parts of nervous system: Secondary | ICD-10-CM

## 2019-01-16 DIAGNOSIS — D500 Iron deficiency anemia secondary to blood loss (chronic): Secondary | ICD-10-CM

## 2019-01-16 DIAGNOSIS — C7972 Secondary malignant neoplasm of left adrenal gland: Secondary | ICD-10-CM | POA: Diagnosis not present

## 2019-01-16 DIAGNOSIS — Z510 Encounter for antineoplastic radiation therapy: Secondary | ICD-10-CM | POA: Diagnosis not present

## 2019-01-16 DIAGNOSIS — C801 Malignant (primary) neoplasm, unspecified: Secondary | ICD-10-CM | POA: Diagnosis not present

## 2019-01-16 LAB — OCCULT BLOOD X 1 CARD TO LAB, STOOL
Fecal Occult Bld: NEGATIVE
Fecal Occult Bld: NEGATIVE
Fecal Occult Bld: NEGATIVE

## 2019-01-18 ENCOUNTER — Other Ambulatory Visit: Payer: Self-pay

## 2019-01-18 ENCOUNTER — Telehealth: Payer: Self-pay | Admitting: *Deleted

## 2019-01-18 ENCOUNTER — Ambulatory Visit
Admission: RE | Admit: 2019-01-18 | Discharge: 2019-01-18 | Disposition: A | Discharge status: Home / Self Care | Payer: 59 | Source: Ambulatory Visit | Attending: Radiation Oncology | Admitting: Radiation Oncology

## 2019-01-18 ENCOUNTER — Other Ambulatory Visit: Payer: Self-pay | Admitting: Internal Medicine

## 2019-01-18 VITALS — BP 121/47 | HR 93 | Temp 98.0°F | Resp 20

## 2019-01-18 DIAGNOSIS — Z510 Encounter for antineoplastic radiation therapy: Secondary | ICD-10-CM | POA: Diagnosis not present

## 2019-01-18 DIAGNOSIS — C7949 Secondary malignant neoplasm of other parts of nervous system: Secondary | ICD-10-CM

## 2019-01-18 DIAGNOSIS — C801 Malignant (primary) neoplasm, unspecified: Secondary | ICD-10-CM | POA: Diagnosis not present

## 2019-01-18 DIAGNOSIS — C7972 Secondary malignant neoplasm of left adrenal gland: Secondary | ICD-10-CM | POA: Diagnosis not present

## 2019-01-18 MED ORDER — FENTANYL 50 MCG/HR TD PT72: 1.0000 | MEDICATED_PATCH | TRANSDERMAL | 0 refills | Status: DC

## 2019-01-18 NOTE — Telephone Encounter (Signed)
Received call from patient requesting refill for him Fentanyl Patch. Last filled on 4/22 for # 5 patches.  She uses [facility]

## 2019-01-18 NOTE — Telephone Encounter (Signed)
Done

## 2019-01-20 NOTE — Progress Notes (Signed)
  Radiation Oncology         [phone] ________________________________  Name: Rebecca Lucas MRN: [id number]  Date: 01/16/2019  DOB: [birth year]  Chart Note:  During the course of her treatment, this patient's set up came into question due to the degree of rotational corrections required.  Accordingly, she returned to the CT simulation suite to repeat simulation imaging.  The image studies from the initial CT on the current CT were fused together confirming a relatively large rotational change for her lower sacroiliac fields.  This seems to be related to patient weight loss.  Given this information, the patient's body positioner will be adjusted by elevating the feet a few inches in an attempt to treat the original plan.  No additional dose calculations were plans are requested at this time.  ________________________________  Sheral Apley Tammi Klippel, M.D.

## 2019-01-21 ENCOUNTER — Other Ambulatory Visit: Payer: Self-pay

## 2019-01-21 ENCOUNTER — Ambulatory Visit
Admission: RE | Admit: 2019-01-21 | Discharge: 2019-01-21 | Disposition: A | Discharge status: Home / Self Care | Payer: 59 | Source: Ambulatory Visit | Attending: Radiation Oncology | Admitting: Radiation Oncology

## 2019-01-21 VITALS — BP 111/57 | HR 100 | Temp 99.8°F | Resp 20

## 2019-01-21 DIAGNOSIS — C7949 Secondary malignant neoplasm of other parts of nervous system: Secondary | ICD-10-CM

## 2019-01-21 DIAGNOSIS — Z510 Encounter for antineoplastic radiation therapy: Secondary | ICD-10-CM | POA: Diagnosis not present

## 2019-01-21 DIAGNOSIS — C801 Malignant (primary) neoplasm, unspecified: Secondary | ICD-10-CM | POA: Diagnosis not present

## 2019-01-21 DIAGNOSIS — C7972 Secondary malignant neoplasm of left adrenal gland: Secondary | ICD-10-CM | POA: Diagnosis not present

## 2019-01-21 NOTE — Progress Notes (Signed)
Pt rested with nursing for 15 minutes after receiving SRS treatment. Pt able to repeat back discharge instructions and knows to call [phone] number with any sudden onset of side effects, questions, or concerns. Loma Sousa, RN BSN

## 2019-01-22 ENCOUNTER — Ambulatory Visit: Payer: 59 | Admitting: Radiation Oncology

## 2019-01-22 LAB — BCR ABL1 FISH (GENPATH)

## 2019-01-22 MED FILL — fentaNYL 50 MCG/HR PT72: 50 | 15 days supply | Qty: 5 | Fill #0

## 2019-01-23 ENCOUNTER — Other Ambulatory Visit: Payer: Self-pay

## 2019-01-23 ENCOUNTER — Ambulatory Visit
Admission: RE | Admit: 2019-01-23 | Discharge: 2019-01-23 | Disposition: A | Discharge status: Home / Self Care | Payer: 59 | Source: Ambulatory Visit | Attending: Radiation Oncology | Admitting: Radiation Oncology

## 2019-01-23 VITALS — BP 97/56 | HR 100 | Temp 98.1°F | Resp 18

## 2019-01-23 DIAGNOSIS — C7949 Secondary malignant neoplasm of other parts of nervous system: Secondary | ICD-10-CM

## 2019-01-23 DIAGNOSIS — Z510 Encounter for antineoplastic radiation therapy: Secondary | ICD-10-CM | POA: Diagnosis not present

## 2019-01-23 DIAGNOSIS — C801 Malignant (primary) neoplasm, unspecified: Secondary | ICD-10-CM | POA: Diagnosis not present

## 2019-01-23 DIAGNOSIS — C7972 Secondary malignant neoplasm of left adrenal gland: Secondary | ICD-10-CM | POA: Diagnosis not present

## 2019-01-23 NOTE — Progress Notes (Signed)
Nurse monitoring following SRS treatments to metastasis in vertebral body complete. Vitals stable. Instructed patient to avoid strenuous activity for the next 24 hours. Instructed patient to phone [phone] with any needs related to his [place] treatment.Patient verbalized understanding of all reviewed. Patient discharged home. Patient exited clinic ambulatory and in no distress.

## 2019-01-24 MED FILL — FOLIC ACID 1 MG TABS: 1 | 90 days supply | Qty: 90 | Fill #0

## 2019-01-25 ENCOUNTER — Ambulatory Visit
Admission: RE | Admit: 2019-01-25 | Discharge: 2019-01-25 | Disposition: A | Discharge status: Home / Self Care | Payer: 59 | Source: Ambulatory Visit | Attending: Radiation Oncology | Admitting: Radiation Oncology

## 2019-01-25 ENCOUNTER — Other Ambulatory Visit: Payer: Self-pay

## 2019-01-25 ENCOUNTER — Ambulatory Visit: Payer: 59 | Admitting: Radiation Oncology

## 2019-01-25 ENCOUNTER — Encounter: Payer: Self-pay | Admitting: Radiation Oncology

## 2019-01-25 VITALS — BP 100/60 | HR 115 | Temp 99.2°F | Resp 20

## 2019-01-25 DIAGNOSIS — C7972 Secondary malignant neoplasm of left adrenal gland: Secondary | ICD-10-CM | POA: Diagnosis not present

## 2019-01-25 DIAGNOSIS — C7949 Secondary malignant neoplasm of other parts of nervous system: Secondary | ICD-10-CM | POA: Diagnosis not present

## 2019-01-25 DIAGNOSIS — C7951 Secondary malignant neoplasm of bone: Secondary | ICD-10-CM

## 2019-01-25 DIAGNOSIS — Z510 Encounter for antineoplastic radiation therapy: Secondary | ICD-10-CM | POA: Diagnosis not present

## 2019-01-25 DIAGNOSIS — C801 Malignant (primary) neoplasm, unspecified: Secondary | ICD-10-CM | POA: Diagnosis not present

## 2019-01-25 NOTE — Progress Notes (Signed)
Nurse monitoring following final SRS treatment to metastasis in vertebral body complete. Vitals stable.Patient reports Medrol dose pak "was a lifesaver" thus pain has been well managed. No evidence of thrush noted. Reports fatigue. Instructed patient to avoid strenuous activity for the next 24 hours. Instructed patient to phone [phone] with any needs related to his [place] treatment.Patient verbalized understanding of all reviewed. Patient discharged home. Patient exited clinicambulatory and in no distress.

## 2019-01-28 ENCOUNTER — Ambulatory Visit: Payer: 59 | Admitting: Radiation Oncology

## 2019-01-31 ENCOUNTER — Inpatient Hospital Stay (HOSPITAL_BASED_OUTPATIENT_CLINIC_OR_DEPARTMENT_OTHER): Payer: 59 | Admitting: Internal Medicine

## 2019-01-31 ENCOUNTER — Inpatient Hospital Stay: Payer: 59

## 2019-01-31 ENCOUNTER — Other Ambulatory Visit: Payer: Self-pay

## 2019-01-31 ENCOUNTER — Encounter: Payer: Self-pay | Admitting: Internal Medicine

## 2019-01-31 VITALS — BP 120/66 | HR 118 | Temp 97.4°F | Resp 18 | Ht 67.0 in | Wt 113.1 lb

## 2019-01-31 VITALS — HR 108 | Temp 98.6°F

## 2019-01-31 DIAGNOSIS — C7951 Secondary malignant neoplasm of bone: Secondary | ICD-10-CM | POA: Diagnosis not present

## 2019-01-31 DIAGNOSIS — C7949 Secondary malignant neoplasm of other parts of nervous system: Secondary | ICD-10-CM

## 2019-01-31 DIAGNOSIS — Z5111 Encounter for antineoplastic chemotherapy: Secondary | ICD-10-CM | POA: Diagnosis not present

## 2019-01-31 DIAGNOSIS — C3411 Malignant neoplasm of upper lobe, right bronchus or lung: Secondary | ICD-10-CM | POA: Diagnosis not present

## 2019-01-31 DIAGNOSIS — C799 Secondary malignant neoplasm of unspecified site: Secondary | ICD-10-CM

## 2019-01-31 DIAGNOSIS — Z510 Encounter for antineoplastic radiation therapy: Secondary | ICD-10-CM | POA: Diagnosis not present

## 2019-01-31 DIAGNOSIS — C3490 Malignant neoplasm of unspecified part of unspecified bronchus or lung: Secondary | ICD-10-CM

## 2019-01-31 DIAGNOSIS — C801 Malignant (primary) neoplasm, unspecified: Secondary | ICD-10-CM | POA: Diagnosis not present

## 2019-01-31 DIAGNOSIS — Z5112 Encounter for antineoplastic immunotherapy: Secondary | ICD-10-CM | POA: Diagnosis not present

## 2019-01-31 DIAGNOSIS — C7972 Secondary malignant neoplasm of left adrenal gland: Secondary | ICD-10-CM | POA: Diagnosis not present

## 2019-01-31 LAB — CBC WITH DIFFERENTIAL (CANCER CENTER ONLY)
Abs Immature Granulocytes: 0.24 10*3/uL — ABNORMAL HIGH (ref 0.00–0.07)
Basophils Absolute: 0 10*3/uL (ref 0.0–0.1)
Basophils Relative: 0 %
Eosinophils Absolute: 0.1 10*3/uL (ref 0.0–0.5)
Eosinophils Relative: 0 %
HCT: 24.1 % — ABNORMAL LOW (ref 36.0–46.0)
Hemoglobin: 7.3 g/dL — ABNORMAL LOW (ref 12.0–15.0)
Immature Granulocytes: 2 %
Lymphocytes Relative: 1 %
Lymphs Abs: 0.2 10*3/uL — ABNORMAL LOW (ref 0.7–4.0)
MCH: 28.4 pg (ref 26.0–34.0)
MCHC: 30.3 g/dL (ref 30.0–36.0)
MCV: 93.8 fL (ref 80.0–100.0)
Monocytes Absolute: 0.7 10*3/uL (ref 0.1–1.0)
Monocytes Relative: 6 %
Neutro Abs: 11.8 10*3/uL — ABNORMAL HIGH (ref 1.7–7.7)
Neutrophils Relative %: 91 %
Platelet Count: 368 10*3/uL (ref 150–400)
RBC: 2.57 MIL/uL — ABNORMAL LOW (ref 3.87–5.11)
RDW: 19.8 % — ABNORMAL HIGH (ref 11.5–15.5)
WBC Count: 13 10*3/uL — ABNORMAL HIGH (ref 4.0–10.5)
nRBC: 0 % (ref 0.0–0.2)

## 2019-01-31 LAB — CMP (CANCER CENTER ONLY)
ALT: 36 U/L (ref 0–44)
AST: 37 U/L (ref 15–41)
Albumin: 2.1 g/dL — ABNORMAL LOW (ref 3.5–5.0)
Alkaline Phosphatase: 239 U/L — ABNORMAL HIGH (ref 38–126)
Anion gap: 10 (ref 5–15)
BUN: 9 mg/dL (ref 8–23)
CO2: 24 mmol/L (ref 22–32)
Calcium: 9 mg/dL (ref 8.9–10.3)
Chloride: 99 mmol/L (ref 98–111)
Creatinine: 0.71 mg/dL (ref 0.44–1.00)
GFR, Est AFR Am: >60 mL/min (ref >60)
GFR, Estimated: >60 mL/min (ref >60)
Glucose, Bld: 141 mg/dL — ABNORMAL HIGH (ref 70–99)
Potassium: 4 mmol/L (ref 3.5–5.1)
Sodium: 133 mmol/L — ABNORMAL LOW (ref 135–145)
Total Bilirubin: <0.2 mg/dL — ABNORMAL LOW (ref 0.3–1.2)
Total Protein: 6.7 g/dL (ref 6.5–8.1)

## 2019-01-31 MED ORDER — SODIUM CHLORIDE 0.9 % IV SOLN
Freq: Once | INTRAVENOUS | Status: DC
  Filled 2019-01-31: qty 250

## 2019-01-31 MED ORDER — SODIUM CHLORIDE 0.9% FLUSH
10.0000 mL | INTRAVENOUS | Status: DC | PRN
  Administered 2019-01-31: 10 mL
  Filled 2019-01-31: qty 10

## 2019-01-31 MED ORDER — DEXAMETHASONE SODIUM PHOSPHATE 10 MG/ML IJ SOLN
INTRAMUSCULAR | Status: AC
  Filled 2019-01-31: qty 1

## 2019-01-31 MED ORDER — SODIUM CHLORIDE 0.9 % IV SOLN
490.0000 mg/m2 | Freq: Once | INTRAVENOUS | Status: AC
  Administered 2019-01-31: 800 mg via INTRAVENOUS
  Filled 2019-01-31: qty 20

## 2019-01-31 MED ORDER — SODIUM CHLORIDE 0.9 % IV SOLN
200.0000 mg | Freq: Once | INTRAVENOUS | Status: AC
  Administered 2019-01-31: 200 mg via INTRAVENOUS
  Filled 2019-01-31: qty 8

## 2019-01-31 MED ORDER — ONDANSETRON HCL 4 MG/2ML IJ SOLN
8.0000 mg | Freq: Once | INTRAMUSCULAR | Status: AC
  Administered 2019-01-31: 8 mg via INTRAVENOUS

## 2019-01-31 MED ORDER — SODIUM CHLORIDE 0.9 % IV SOLN
Freq: Once | INTRAVENOUS | Status: AC
  Administered 2019-01-31: 12:00:00 via INTRAVENOUS
  Filled 2019-01-31: qty 250

## 2019-01-31 MED ORDER — ONDANSETRON HCL 4 MG/2ML IJ SOLN
INTRAMUSCULAR | Status: AC
  Filled 2019-01-31: qty 4

## 2019-01-31 MED ORDER — HEPARIN SOD (PORK) LOCK FLUSH 100 UNIT/ML IV SOLN
500.0000 [IU] | Freq: Once | INTRAVENOUS | Status: AC | PRN
  Administered 2019-01-31: 500 [IU]
  Filled 2019-01-31: qty 5

## 2019-01-31 MED ORDER — DEXAMETHASONE SODIUM PHOSPHATE 10 MG/ML IJ SOLN
10.0000 mg | Freq: Once | INTRAMUSCULAR | Status: AC
  Administered 2019-01-31: 12:00:00 10 mg via INTRAVENOUS

## 2019-01-31 NOTE — Progress Notes (Signed)
Okay to treat-Per Dr Julien Nordmann it is okay to treat pt today with Carboplatin,alimta and [place] and a heart rate of 118 and hgb of 7.3

## 2019-01-31 NOTE — Progress Notes (Signed)
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  Metastatic sarcoma cannot be also excluded at this point. Molecular studies by foundation 1 showed no actionable mutations. PDL 1 expression 99%  PRIOR THERAPY: 1) Palliative radiotherapy to the neck mass and hip lesion under the care of Dr. Tammi Klippel  2) palliative radiotherapy to the left adrenal gland and periaortic lymph node under the care of Dr. Tammi Klippel.  CURRENT THERAPY: Systemic chemotherapy with carboplatin for AUC of 5, Alimta 500 mg/M2 and Keytruda 200 mg IV every 3 weeks.  First dose was April 10, 2018.  Status post 14 cycles.  Starting from cycle #5 the patient is on maintenance treatment with Alimta and Keytruda.  INTERVAL HISTORY: Rebecca Lucas 63 y.o. female returns to the clinic today for follow-up visit.  The patient is feeling fine today with no concerning complaints except for fatigue.  The pain in the lower back has significantly improved after the palliative radiotherapy.  She is currently reduced her dose of fentanyl patch to 25 mcg/hour every 3 days.  She denied having any fever or chills.  She has no nausea, vomiting, diarrhea or constipation.  She is here today for evaluation before starting cycle #15 of her treatment.  MEDICAL HISTORY:No past medical history on file.  ALLERGIES:  has No Known Allergies.  MEDICATIONS:  Current Outpatient Medications  Medication Sig Dispense Refill  . acetaminophen (TYLENOL) 500 MG tablet Take 1,000 mg by mouth every 6 (six) hours as needed for mild pain.     Marland Kitchen alendronate (FOSAMAX) 70 MG tablet Take 70 mg by mouth once a week.  Wednesday  3  . butalbital-aspirin-caffeine (FIORINAL) 50-325-40 MG capsule Take 1 capsule by mouth every 6 (six) hours as needed for migraine.   1  . Calcium Carbonate-Vitamin D (CALCIUM-D PO) Take 1 tablet by mouth 2 (two) times daily.    . fentaNYL (DURAGESIC) 50 MCG/HR Place 1 patch onto the skin every 3 (three) days. 5 patch 0  . fluconazole (DIFLUCAN) 100 MG tablet Take 1 tablet (100 mg total) by mouth daily. 7 tablet 0  . folic acid (FOLVITE) 1 MG tablet Take 1 tablet (1 mg total) by mouth daily. 90 tablet 1  . ibuprofen (ADVIL,MOTRIN) 200 MG tablet Take 600 mg by mouth every 6 (six) hours as needed for mild pain.     Marland Kitchen lidocaine-prilocaine (EMLA) cream Apply 1 application topically as needed. 30 g 0  . linaclotide (LINZESS) 72 MCG capsule Take 1 capsule (72 mcg total) by mouth daily before breakfast. 30 capsule 0  . LUTEIN PO Take 1 tablet by mouth daily.    . methylPREDNISolone (MEDROL) 4 MG tablet Take 1 tablet (4 mg total) by mouth daily. Standard medrol dosepak. Start today to manage potential pain flare s/p srs. 21 tablet 0  . omeprazole (PRILOSEC) 20 MG capsule Take 1 capsule (20 mg total) by mouth daily. 90 capsule 1  . ondansetron (ZOFRAN) 8 MG tablet Take 1 tablet (8 mg total) by mouth every 8 (eight) hours as needed for nausea or vomiting. 20 tablet 0  . traMADol (ULTRAM) 50 MG tablet Take 1 tablet (50 mg total) by mouth every 12 (  twelve) hours as needed for moderate pain. 60 tablet 0  . zolpidem (AMBIEN) 10 MG tablet Take 1 tablet (10 mg total) by mouth at bedtime as needed for sleep. 30 tablet 0   No current facility-administered medications for this visit.     SURGICAL HISTORY:  Past Surgical History:  Procedure Laterality Date  . IR IMAGING GUIDED PORT INSERTION  06/26/2018    REVIEW OF SYSTEMS:  A comprehensive review of systems was negative except for: Constitutional: positive for fatigue Musculoskeletal: positive for back pain   PHYSICAL EXAMINATION: General  appearance: alert, cooperative, fatigued and no distress Head: Normocephalic, without obvious abnormality, atraumatic Neck: no adenopathy, no JVD, supple, symmetrical, trachea midline and thyroid not enlarged, symmetric, no tenderness/mass/nodules Lymph nodes: Cervical, supraclavicular, and axillary nodes normal. Resp: clear to auscultation bilaterally Back: symmetric, no curvature. ROM normal. No CVA tenderness. Cardio: regular rate and rhythm, S1, S2 normal, no murmur, click, rub or gallop GI: soft, non-tender; bowel sounds normal; no masses,  no organomegaly Extremities: extremities normal, atraumatic, no cyanosis or edema  ECOG PERFORMANCE STATUS: 1 - Symptomatic but completely ambulatory  Blood pressure 120/66, pulse (!) 118, temperature (!) 97.4 F (36.3 C), temperature source Oral, resp. rate 18, height 5\' 7"  (1.702 m), weight 113 lb 1.6 oz (51.3 kg), SpO2 100 %.  LABORATORY DATA: Lab Results  Component Value Date   WBC 13.0 (H) 01/31/2019   HGB 7.3 (L) 01/31/2019   HCT 24.1 (L) 01/31/2019   MCV 93.8 01/31/2019   PLT 368 01/31/2019      Chemistry      Component Value Date/Time   NA 134 (L) 01/10/2019 0940   K 4.3 01/10/2019 0940   CL 97 (L) 01/10/2019 0940   CO2 26 01/10/2019 0940   BUN 11 01/10/2019 0940   CREATININE 0.69 01/10/2019 0940      Component Value Date/Time   CALCIUM 8.7 (L) 01/10/2019 0940   ALKPHOS 321 (H) 01/10/2019 0940   AST 39 01/10/2019 0940   ALT 33 01/10/2019 0940   BILITOT <0.2 (L) 01/10/2019 0940       RADIOGRAPHIC STUDIES: Mr Lumbar Spine W Wo Contrast  Result Date: 01/01/2019 CLINICAL DATA:  History of lung cancer. EXAM: MRI LUMBAR SPINE WITHOUT AND WITH CONTRAST TECHNIQUE: Multiplanar and multiecho pulse sequences of the lumbar spine were obtained without and with intravenous contrast. CONTRAST:  79mL MULTIHANCE GADOBENATE DIMEGLUMINE 529 MG/ML IV SOLN COMPARISON:  CT abdomen 12/18/2018 FINDINGS: Segmentation:  Standard. Alignment:   Physiologic. Vertebrae: Abnormal bone lesions involving the L1, L2 and L3 vertebral bodies most consistent with metastatic disease. Severe pathologic compression fracture of the L2 vertebral body with approximately 90% anterior height loss. Anterior paravertebral soft tissue component centered at L2 extending superiorly towards L1 and inferiorly towards L3 with heterogeneous enhancement most concerning for extra osseous extension of tumor. Small epidural tumor along the posterior aspect of the L2 vertebral body with left foraminal extension. Abnormal enhancing bone lesions in the left posterior ilium, right sacrum and right ilium most consistent with metastatic disease. Pathologic nondisplaced fracture of the right sacral ala. No discitis or osteomyelitis. Conus medullaris and cauda equina: Conus extends to the T12 level. Conus and cauda equina appear normal. Paraspinal and other soft tissues: Otherwise, no paraspinal abnormality. Disc levels: Disc spaces: Degenerative disc disease with disc height loss at L1-2. T12-L1: Tiny left paracentral disc protrusion. No evidence of neural foraminal stenosis. No central canal stenosis. L1-L2: Mild broad-based disc bulge. No evidence of neural foraminal stenosis.  No central canal stenosis. L2-L3: Minimal broad-based disc bulge. No evidence of neural foraminal stenosis. No central canal stenosis. L3-L4: No significant disc bulge. No evidence of neural foraminal stenosis. No central canal stenosis. L4-L5: No significant disc bulge. No evidence of neural foraminal stenosis. No central canal stenosis. L5-S1: No significant disc bulge. No evidence of neural foraminal stenosis. No central canal stenosis. IMPRESSION: 1. Metastatic disease involving the L1, L2 and L3 vertebral bodies. Severe pathologic compression fracture of the L2 vertebral body with approximately 90% anterior height loss. Anterior paravertebral soft tissue component centered at L2 extending superiorly towards L1  and inferiorly towards L3 with heterogeneous enhancement most concerning for extra osseous extension of tumor. Small epidural tumor along the posterior aspect of the L2 vertebral body with left foraminal extension. 2. Metastatic disease involving the left posterior ilium, right sacrum and right ilium most consistent with metastatic disease. Pathologic nondisplaced fracture of the right sacral ala. 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 PDL 1 expression was 99%. The molecular studies by foundation 1 as well as [employer] showed no actionable mutations. The patient was a started on treatment with systemic chemotherapy with carboplatin for AUC of 5, Alimta 500 mg/M2 and Keytruda 200 mg IV every 3 weeks.  She is status post 4 cycles with partial response.  The patient was started on maintenance treatment with Alimta and Keytruda status post 10 cycles. She continues to tolerate this treatment well with no concerning complaints except for the fatigue secondary to chemotherapy as well as radiation therapy induced anemia. I recommended for the patient to proceed with cycle #11 of her treatment today. I will see her back for follow-up visit in 3 weeks for evaluation with repeat CT scan of the chest, abdomen and pelvis for restaging of her disease. For the back pain, she underwent palliative radiotherapy to the metastatic lesion in the lumbar spine and she is feeling much better. For pain management she is currently on fentanyl patch 25 mcg/hour every 3 days in addition to tramadol 3 times a day as well as ibuprofen  and Tylenol on as-needed basis.  She plans to wean herself off these medication as tolerated.   For the persistent anemia, leukocytosis and thrombocythemia, the etiology is unclear.  She underwent several studies to rule out myeloproliferative disorder and these studies were unremarkable.  The only concerning findings were the low serum iron and the patient received Feraheme infusion. She was advised to call immediately if she has any concerning symptoms in the interval. The patient voices understanding of current disease status and treatment options and is in agreement with the current care plan. All questions were answered. The patient knows to call the clinic with any problems, questions or concerns. We can certainly see the patient much sooner if necessary.  Disclaimer: This note was dictated with voice recognition software. Similar sounding words can inadvertently be transcribed and may not be corrected upon review.

## 2019-01-31 NOTE — Patient Instructions (Signed)
[facility] Discharge Instructions for Patients Receiving Chemotherapy  Today you received the following chemotherapy agents Pembrolizumab (KEYTRUDA).  To help prevent nausea and vomiting after your treatment, we encourage you to take your nausea medication as prescribed.   If you develop nausea and vomiting that is not controlled by your nausea medication, call the clinic.   BELOW ARE SYMPTOMS THAT SHOULD BE REPORTED IMMEDIATELY:  *FEVER GREATER THAN 100.5 F  *CHILLS WITH OR WITHOUT FEVER  NAUSEA AND VOMITING THAT IS NOT CONTROLLED WITH YOUR NAUSEA MEDICATION  *UNUSUAL SHORTNESS OF BREATH  *UNUSUAL BRUISING OR BLEEDING  TENDERNESS IN MOUTH AND THROAT WITH OR WITHOUT PRESENCE OF ULCERS  *URINARY PROBLEMS  *BOWEL PROBLEMS  UNUSUAL RASH Items with * indicate a potential emergency and should be followed up as soon as possible.  Feel free to call the clinic should you have any questions or concerns. The clinic phone number is [phone].  Please show the CHEMO ALERT CARD at check-in to the Emergency Department and triage nurse.  Coronavirus (COVID-19) Are you at risk?  Are you at risk for the Coronavirus (COVID-19)?  To be considered HIGH RISK for Coronavirus (COVID-19), you have to meet the following criteria:  . Traveled to [place], [place], [place], [place] or [place]; or in the [place] to [place], [place], [place], or [place]; and have fever, cough, and shortness of breath within the last 2 weeks of travel OR . Been in close contact with a person diagnosed with COVID-19 within the last 2 weeks and have fever, cough, and shortness of breath . IF YOU DO NOT MEET THESE CRITERIA, YOU ARE CONSIDERED LOW RISK FOR COVID-19.  What to do if you are HIGH RISK for COVID-19?  . If you are having a medical emergency, call 911. . Seek medical care right away. Before you go to a doctor's office, urgent care or emergency department, call ahead and tell  them about your recent travel, contact with someone diagnosed with COVID-19, and your symptoms. You should receive instructions from your physician's office regarding next steps of care.  . When you arrive at healthcare provider, tell the healthcare staff immediately you have returned from visiting [place], [place], [place], [place] or [place]; or traveled in the [place] to [place], [place], [place], or [place]; in the last two weeks or you have been in close contact with a person diagnosed with COVID-19 in the last 2 weeks.   . Tell the health care staff about your symptoms: fever, cough and shortness of breath. . After you have been seen by a medical provider, you will be either: o Tested for (COVID-19) and discharged home on quarantine except to seek medical care if symptoms worsen, and asked to  - Stay home and avoid contact with others until you get your results (4-5 days)  - Avoid travel on public transportation if possible (such as bus, train, or airplane) or o Sent to the Emergency Department by EMS for evaluation, COVID-19 testing, and possible admission depending on your condition and test results.  What to do if you are LOW RISK for COVID-19?  Reduce your risk of any infection by using the same precautions used for avoiding the common cold or flu:  . Wash your hands often with soap and warm water for at least 20 seconds.  If soap and water are not readily available, use an alcohol-based hand sanitizer with at least 60% alcohol.  . If coughing or   sneezing, cover your mouth and nose by coughing or sneezing into the elbow areas of your shirt or coat, into a tissue or into your sleeve (not your hands). . Avoid shaking hands with others and consider head nods or verbal greetings only. . Avoid touching your eyes, nose, or mouth with unwashed hands.  . Avoid close contact with people who are sick. . Avoid places or events with large numbers of people in one location, like concerts or  sporting events. . Carefully consider travel plans you have or are making. . If you are planning any travel outside or inside the [place], visit the CDC's Travelers' Health webpage for the latest health notices. . If you have some symptoms but not all symptoms, continue to monitor at home and seek medical attention if your symptoms worsen. . If you are having a medical emergency, call 911.   ADDITIONAL HEALTHCARE OPTIONS FOR PATIENTS   Telehealth / e-Visit: [email or web address]         [facility]: [phone]  [facility]: [phone]                   [facility]: [phone]    

## 2019-02-05 ENCOUNTER — Encounter: Payer: Self-pay | Admitting: General Practice

## 2019-02-05 ENCOUNTER — Other Ambulatory Visit: Payer: Self-pay | Admitting: Internal Medicine

## 2019-02-05 DIAGNOSIS — R112 Nausea with vomiting, unspecified: Secondary | ICD-10-CM

## 2019-02-05 MED FILL — ONDANSETRON HCL 8 MG TABLET: 8 | 7 days supply | Qty: 20 | Fill #0

## 2019-02-05 MED FILL — LINZESS 72 MCG CAPSULE: 72 | 30 days supply | Qty: 30 | Fill #0

## 2019-02-05 NOTE — Progress Notes (Signed)
[facility] Spiritual Care Note  Received referral from Inetha's husband Gershon Mussel, who is a physician with employee health, but was unable to meet Rylann on campus during her recent visit. Mailed handwritten note of introduction and ongoing availability, including [facility] programming resources.   [place], [place], [facility] Pager [phone] Voicemail [phone]

## 2019-02-12 ENCOUNTER — Other Ambulatory Visit: Payer: Self-pay | Admitting: Internal Medicine

## 2019-02-12 ENCOUNTER — Telehealth: Payer: Self-pay | Admitting: Medical Oncology

## 2019-02-12 MED ORDER — FENTANYL 50 MCG/HR TD PT72: 1.0000 | MEDICATED_PATCH | TRANSDERMAL | 0 refills | Status: DC

## 2019-02-12 NOTE — Telephone Encounter (Signed)
She stated that she has weaned herself off Fentanyl. She requests tramadol refill.

## 2019-02-13 ENCOUNTER — Other Ambulatory Visit: Payer: Self-pay | Admitting: Medical Oncology

## 2019-02-13 ENCOUNTER — Other Ambulatory Visit: Payer: Self-pay | Admitting: Internal Medicine

## 2019-02-13 ENCOUNTER — Telehealth: Payer: Self-pay | Admitting: Medical Oncology

## 2019-02-13 DIAGNOSIS — C799 Secondary malignant neoplasm of unspecified site: Secondary | ICD-10-CM

## 2019-02-13 DIAGNOSIS — C7951 Secondary malignant neoplasm of bone: Secondary | ICD-10-CM

## 2019-02-13 MED ORDER — TRAMADOL HCL 50 MG PO TABS: 50.0000 mg | ORAL_TABLET | Freq: Two times a day (BID) | ORAL | 0 refills | Status: DC | PRN

## 2019-02-13 MED FILL — traMADol HCL 50 MG TABS: 50 | 30 days supply | Qty: 60 | Fill #0

## 2019-02-13 NOTE — Telephone Encounter (Signed)
Called in tramadol  Refill-ok per [place].

## 2019-02-15 LAB — JAK2 (INCLUDING V617F AND EXON 12), MPL,& CALR-NEXT GEN SEQ

## 2019-02-19 ENCOUNTER — Encounter (HOSPITAL_COMMUNITY): Payer: Self-pay

## 2019-02-19 ENCOUNTER — Ambulatory Visit (HOSPITAL_COMMUNITY)
Admission: RE | Admit: 2019-02-19 | Discharge: 2019-02-19 | Disposition: A | Discharge status: Home / Self Care | Payer: 59 | Source: Ambulatory Visit | Attending: Internal Medicine | Admitting: Internal Medicine

## 2019-02-19 ENCOUNTER — Other Ambulatory Visit: Payer: Self-pay

## 2019-02-19 DIAGNOSIS — C7951 Secondary malignant neoplasm of bone: Secondary | ICD-10-CM | POA: Diagnosis not present

## 2019-02-19 DIAGNOSIS — C3411 Malignant neoplasm of upper lobe, right bronchus or lung: Secondary | ICD-10-CM | POA: Diagnosis not present

## 2019-02-19 DIAGNOSIS — C3490 Malignant neoplasm of unspecified part of unspecified bronchus or lung: Secondary | ICD-10-CM | POA: Diagnosis not present

## 2019-02-19 MED ORDER — IOHEXOL 300 MG/ML  SOLN
100.0000 mL | Freq: Once | INTRAMUSCULAR | Status: AC | PRN
  Administered 2019-02-19: 100 mL via INTRAVENOUS

## 2019-02-19 MED ORDER — HEPARIN SOD (PORK) LOCK FLUSH 100 UNIT/ML IV SOLN
INTRAVENOUS | Status: AC
  Filled 2019-02-19: qty 5

## 2019-02-19 MED ORDER — SODIUM CHLORIDE (PF) 0.9 % IJ SOLN
INTRAMUSCULAR | Status: AC
  Filled 2019-02-19: qty 50

## 2019-02-19 NOTE — Progress Notes (Signed)
  Radiation Oncology         [phone]) [phone] ________________________________  Name: Rebecca Lucas MRN: [id number]  Date: 01/25/2019  DOB: [birth year]  End of Treatment Note  Diagnosis:   63 y.o. female with metastatic poorly differentiated malignant neoplasm of theright upper lung with disease progression to L1-L3 and the sacrum  Indication for treatment:  Curative  Radiation treatment dates:   01/11/2019 - 01/25/2019  Site/dose:   The patient received 40 Gy in 5 fractions to the mid lumbar spine (L1-L3) and 30 Gy in 5 fractions to the bilateral sacroiliac metastases.  Beams/energy:   SBRT/SRT-VMAT, 3 VMAT fields // 10X-FFF Photon for L1-L3  SBRT/SRT-VMAT, 4 VMAT fields // 10X-FFF Photon for bilateral sacroiliac metastases.  Narrative: The patient tolerated radiation treatment relatively well.  A Medrol Dosepak was given to manage potential pain flares s/p SRS. She did experience modest fatigue but otherwise denied any acute ill side effects with treatment.  Plan: The patient has completed radiation treatment. She will return to radiation oncology clinic for routine followup in one month. I advised her to call or return sooner if she has any questions or concerns related to her recovery or treatment. ________________________________  Sheral Apley. Tammi Klippel, M.D.  This document serves as a record of services personally performed by Tyler Pita, MD. It was created on his behalf by Rae Lips, a trained medical scribe. The creation of this record is based on the scribe's personal observations and the provider's statements to them. This document has been checked and approved by the attending provider.

## 2019-02-20 ENCOUNTER — Other Ambulatory Visit: Payer: Self-pay | Admitting: Medical Oncology

## 2019-02-20 DIAGNOSIS — Z5112 Encounter for antineoplastic immunotherapy: Secondary | ICD-10-CM

## 2019-02-20 DIAGNOSIS — C799 Secondary malignant neoplasm of unspecified site: Secondary | ICD-10-CM

## 2019-02-20 MED FILL — ALENDRONATE NA 70 MG TAB: 70 | 84 days supply | Qty: 12 | Fill #1

## 2019-02-21 ENCOUNTER — Inpatient Hospital Stay: Payer: 59 | Attending: Internal Medicine

## 2019-02-21 ENCOUNTER — Inpatient Hospital Stay: Payer: 59

## 2019-02-21 ENCOUNTER — Inpatient Hospital Stay (HOSPITAL_BASED_OUTPATIENT_CLINIC_OR_DEPARTMENT_OTHER): Payer: 59 | Admitting: Internal Medicine

## 2019-02-21 ENCOUNTER — Other Ambulatory Visit: Payer: Self-pay

## 2019-02-21 VITALS — BP 120/66 | HR 110 | Temp 98.6°F | Resp 18 | Ht 67.0 in | Wt 115.8 lb

## 2019-02-21 VITALS — HR 99

## 2019-02-21 DIAGNOSIS — C770 Secondary and unspecified malignant neoplasm of lymph nodes of head, face and neck: Secondary | ICD-10-CM

## 2019-02-21 DIAGNOSIS — C7972 Secondary malignant neoplasm of left adrenal gland: Secondary | ICD-10-CM

## 2019-02-21 DIAGNOSIS — Z5111 Encounter for antineoplastic chemotherapy: Secondary | ICD-10-CM

## 2019-02-21 DIAGNOSIS — C7949 Secondary malignant neoplasm of other parts of nervous system: Secondary | ICD-10-CM

## 2019-02-21 DIAGNOSIS — Z5112 Encounter for antineoplastic immunotherapy: Secondary | ICD-10-CM

## 2019-02-21 DIAGNOSIS — G893 Neoplasm related pain (acute) (chronic): Secondary | ICD-10-CM | POA: Diagnosis not present

## 2019-02-21 DIAGNOSIS — D6481 Anemia due to antineoplastic chemotherapy: Secondary | ICD-10-CM | POA: Insufficient documentation

## 2019-02-21 DIAGNOSIS — C7951 Secondary malignant neoplasm of bone: Secondary | ICD-10-CM | POA: Insufficient documentation

## 2019-02-21 DIAGNOSIS — J90 Pleural effusion, not elsewhere classified: Secondary | ICD-10-CM | POA: Diagnosis not present

## 2019-02-21 DIAGNOSIS — C801 Malignant (primary) neoplasm, unspecified: Secondary | ICD-10-CM

## 2019-02-21 DIAGNOSIS — C799 Secondary malignant neoplasm of unspecified site: Secondary | ICD-10-CM

## 2019-02-21 DIAGNOSIS — Z79899 Other long term (current) drug therapy: Secondary | ICD-10-CM | POA: Insufficient documentation

## 2019-02-21 DIAGNOSIS — D649 Anemia, unspecified: Secondary | ICD-10-CM

## 2019-02-21 DIAGNOSIS — C3411 Malignant neoplasm of upper lobe, right bronchus or lung: Secondary | ICD-10-CM

## 2019-02-21 DIAGNOSIS — C7800 Secondary malignant neoplasm of unspecified lung: Secondary | ICD-10-CM | POA: Diagnosis not present

## 2019-02-21 LAB — CMP (CANCER CENTER ONLY)
ALT: 41 U/L (ref 0–44)
AST: 47 U/L — ABNORMAL HIGH (ref 15–41)
Albumin: 2.2 g/dL — ABNORMAL LOW (ref 3.5–5.0)
Alkaline Phosphatase: 311 U/L — ABNORMAL HIGH (ref 38–126)
Anion gap: 11 (ref 5–15)
BUN: 11 mg/dL (ref 8–23)
CO2: 25 mmol/L (ref 22–32)
Calcium: 8.9 mg/dL (ref 8.9–10.3)
Chloride: 99 mmol/L (ref 98–111)
Creatinine: 0.74 mg/dL (ref 0.44–1.00)
GFR, Est AFR Am: >60 mL/min (ref >60)
GFR, Estimated: >60 mL/min (ref >60)
Glucose, Bld: 148 mg/dL — ABNORMAL HIGH (ref 70–99)
Potassium: 4.6 mmol/L (ref 3.5–5.1)
Sodium: 135 mmol/L (ref 135–145)
Total Bilirubin: <0.2 mg/dL — ABNORMAL LOW (ref 0.3–1.2)
Total Protein: 6.8 g/dL (ref 6.5–8.1)

## 2019-02-21 LAB — CBC WITH DIFFERENTIAL (CANCER CENTER ONLY)
Abs Immature Granulocytes: 0.18 10*3/uL — ABNORMAL HIGH (ref 0.00–0.07)
Basophils Absolute: 0 10*3/uL (ref 0.0–0.1)
Basophils Relative: 0 %
Eosinophils Absolute: 0 10*3/uL (ref 0.0–0.5)
Eosinophils Relative: 0 %
HCT: 26.3 % — ABNORMAL LOW (ref 36.0–46.0)
Hemoglobin: 7.9 g/dL — ABNORMAL LOW (ref 12.0–15.0)
Immature Granulocytes: 2 %
Lymphocytes Relative: 2 %
Lymphs Abs: 0.2 10*3/uL — ABNORMAL LOW (ref 0.7–4.0)
MCH: 29.2 pg (ref 26.0–34.0)
MCHC: 30 g/dL (ref 30.0–36.0)
MCV: 97 fL (ref 80.0–100.0)
Monocytes Absolute: 0.8 10*3/uL (ref 0.1–1.0)
Monocytes Relative: 7 %
Neutro Abs: 10.5 10*3/uL — ABNORMAL HIGH (ref 1.7–7.7)
Neutrophils Relative %: 89 %
Platelet Count: 506 10*3/uL — ABNORMAL HIGH (ref 150–400)
RBC: 2.71 MIL/uL — ABNORMAL LOW (ref 3.87–5.11)
RDW: 18.9 % — ABNORMAL HIGH (ref 11.5–15.5)
WBC Count: 11.7 10*3/uL — ABNORMAL HIGH (ref 4.0–10.5)
nRBC: 0 % (ref 0.0–0.2)

## 2019-02-21 LAB — TSH: TSH: 1.754 u[IU]/mL (ref 0.308–3.960)

## 2019-02-21 MED ORDER — CYANOCOBALAMIN 1000 MCG/ML IJ SOLN
INTRAMUSCULAR | Status: AC
  Filled 2019-02-21: qty 1

## 2019-02-21 MED ORDER — SODIUM CHLORIDE 0.9 % IV SOLN
490.0000 mg/m2 | Freq: Once | INTRAVENOUS | Status: AC
  Administered 2019-02-21: 800 mg via INTRAVENOUS
  Filled 2019-02-21: qty 12

## 2019-02-21 MED ORDER — SODIUM CHLORIDE 0.9 % IV SOLN
200.0000 mg | Freq: Once | INTRAVENOUS | Status: AC
  Administered 2019-02-21: 200 mg via INTRAVENOUS
  Filled 2019-02-21: qty 8

## 2019-02-21 MED ORDER — DEXAMETHASONE SODIUM PHOSPHATE 10 MG/ML IJ SOLN
10.0000 mg | Freq: Once | INTRAMUSCULAR | Status: AC
  Administered 2019-02-21: 13:00:00 10 mg via INTRAVENOUS

## 2019-02-21 MED ORDER — CYANOCOBALAMIN 1000 MCG/ML IJ SOLN
1000.0000 ug | Freq: Once | INTRAMUSCULAR | Status: AC
  Administered 2019-02-21: 1000 ug via INTRAMUSCULAR

## 2019-02-21 MED ORDER — SODIUM CHLORIDE 0.9% FLUSH
10.0000 mL | INTRAVENOUS | Status: DC | PRN
  Administered 2019-02-21: 10 mL
  Filled 2019-02-21: qty 10

## 2019-02-21 MED ORDER — ONDANSETRON HCL 4 MG/2ML IJ SOLN
8.0000 mg | Freq: Once | INTRAMUSCULAR | Status: AC
  Administered 2019-02-21: 8 mg via INTRAVENOUS

## 2019-02-21 MED ORDER — HEPARIN SOD (PORK) LOCK FLUSH 100 UNIT/ML IV SOLN
500.0000 [IU] | Freq: Once | INTRAVENOUS | Status: AC | PRN
  Administered 2019-02-21: 500 [IU]
  Filled 2019-02-21: qty 5

## 2019-02-21 MED ORDER — DEXAMETHASONE SODIUM PHOSPHATE 10 MG/ML IJ SOLN
INTRAMUSCULAR | Status: AC
  Filled 2019-02-21: qty 1

## 2019-02-21 MED ORDER — SODIUM CHLORIDE 0.9 % IV SOLN
Freq: Once | INTRAVENOUS | Status: AC
  Administered 2019-02-21: 13:00:00 via INTRAVENOUS
  Filled 2019-02-21: qty 250

## 2019-02-21 MED ORDER — ONDANSETRON HCL 4 MG/2ML IJ SOLN
INTRAMUSCULAR | Status: AC
  Filled 2019-02-21: qty 4

## 2019-02-21 NOTE — Progress Notes (Signed)
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  Metastatic sarcoma cannot be also excluded at this point. Molecular studies by [facility] 1 showed no actionable mutations. PDL 1 expression 99%  PRIOR THERAPY: 1) Palliative radiotherapy to the neck mass and hip lesion under the care of Dr. Tammi Klippel  2) palliative radiotherapy to the left adrenal gland and periaortic lymph node under the care of Dr. Tammi Klippel.  CURRENT THERAPY: Systemic chemotherapy with carboplatin for AUC of 5, Alimta 500 mg/M2 and Keytruda 200 mg IV every 3 weeks.  First dose was April 10, 2018.  Status post 14 cycles.  Starting from cycle #5 the patient is on maintenance treatment with Alimta and Keytruda.  INTERVAL HISTORY: Rebecca Lucas 63 y.o. female returns to the clinic today for follow-up visit.  The patient is feeling fine today with no concerning complaints.  She denied having any chest pain, shortness of breath, cough or hemoptysis.  She denied having any fever or chills.  She has no headache or visual changes.  She continues to have low back pain.  She was on fentanyl patch and tramadol and she is trying to wean herself off fentanyl patch because of concern about addiction as well as nausea.  She would like a refill of her tramadol and increasing the dose to every 6 hours.  She denied having any weight loss or night sweats.  She continues to tolerate her treatment with maintenance Keytruda and Alimta fairly well.  She had repeat CT scan of the chest, abdomen and pelvis performed recently and she is here for evaluation and discussion of her scan results.  MEDICAL  HISTORY:No past medical history on file.  ALLERGIES:  has No Known Allergies.  MEDICATIONS:  Current Outpatient Medications  Medication Sig Dispense Refill  . acetaminophen (TYLENOL) 500 MG tablet Take 1,000 mg by mouth every 6 (six) hours as needed for mild pain.     Marland Kitchen alendronate (FOSAMAX) 70 MG tablet Take 70 mg by mouth once a week. Wednesday  3  . butalbital-aspirin-caffeine (FIORINAL) 50-325-40 MG capsule Take 1 capsule by mouth every 6 (six) hours as needed for migraine.   1  . Calcium Carbonate-Vitamin D (CALCIUM-D PO) Take 1 tablet by mouth 2 (two) times daily.    . folic acid (FOLVITE) 1 MG tablet Take 1 tablet (1 mg total) by mouth daily. 90 tablet 1  . ibuprofen (ADVIL,MOTRIN) 200 MG tablet Take 600 mg by mouth every 6 (six) hours as needed for mild pain.     Marland Kitchen lidocaine-prilocaine (EMLA) cream Apply 1 application topically as needed. 30 g 0  . LINZESS 72 MCG capsule TAKE 1 CAPSULE (72 MCG TOTAL) BY MOUTH DAILY BEFORE BREAKFAST. 30 capsule 0  . LUTEIN PO Take 1 tablet by mouth daily.    Marland Kitchen omeprazole (PRILOSEC) 20 MG capsule Take 1 capsule (20 mg total) by mouth daily. 90 capsule 1  . ondansetron (ZOFRAN) 8 MG tablet TAKE 1 TABLET (8 MG TOTAL) BY MOUTH EVERY 8 HOURS AS NEEDED FOR NAUSEA OR VOMITING. 20 tablet 0  . traMADol (ULTRAM) 50 MG tablet Take 1 tablet (50 mg total) by mouth every 12 (twelve) hours as needed  for moderate pain. 60 tablet 0  . zolpidem (AMBIEN) 10 MG tablet Take 1 tablet (10 mg total) by mouth at bedtime as needed for sleep. 30 tablet 0   No current facility-administered medications for this visit.     SURGICAL HISTORY:  Past Surgical History:  Procedure Laterality Date  . IR IMAGING GUIDED PORT INSERTION  06/26/2018    REVIEW OF SYSTEMS:  Constitutional: positive for fatigue Eyes: negative Ears, nose, mouth, throat, and face: negative Respiratory: negative Cardiovascular: negative Gastrointestinal: negative Genitourinary:negative  Integument/breast: negative Hematologic/lymphatic: negative Musculoskeletal:positive for back pain Neurological: negative Behavioral/Psych: negative Endocrine: negative Allergic/Immunologic: negative   PHYSICAL EXAMINATION: General appearance: alert, cooperative, fatigued and no distress Head: Normocephalic, without obvious abnormality, atraumatic Neck: no adenopathy, no JVD, supple, symmetrical, trachea midline and thyroid not enlarged, symmetric, no tenderness/mass/nodules Lymph nodes: Cervical, supraclavicular, and axillary nodes normal. Resp: clear to auscultation bilaterally Back: symmetric, no curvature. ROM normal. No CVA tenderness. Cardio: regular rate and rhythm, S1, S2 normal, no murmur, click, rub or gallop GI: soft, non-tender; bowel sounds normal; no masses,  no organomegaly Extremities: extremities normal, atraumatic, no cyanosis or edema Neurologic: Alert and oriented X 3, normal strength and tone. Normal symmetric reflexes. Normal coordination and gait  ECOG PERFORMANCE STATUS: 1 - Symptomatic but completely ambulatory  Blood pressure 120/66, pulse (!) 110, temperature 98.6 F (37 C), temperature source Oral, resp. rate 18, height 5\' 7"  (1.702 m), weight 115 lb 12.8 oz (52.5 kg), SpO2 100 %.  LABORATORY DATA: Lab Results  Component Value Date   WBC 11.7 (H) 02/21/2019   HGB 7.9 (L) 02/21/2019   HCT 26.3 (L) 02/21/2019   MCV 97.0 02/21/2019   PLT 506 (H) 02/21/2019      Chemistry      Component Value Date/Time   NA 133 (L) 01/31/2019 1048   K 4.0 01/31/2019 1048   CL 99 01/31/2019 1048   CO2 24 01/31/2019 1048   BUN 9 01/31/2019 1048   CREATININE 0.71 01/31/2019 1048      Component Value Date/Time   CALCIUM 9.0 01/31/2019 1048   ALKPHOS 239 (H) 01/31/2019 1048   AST 37 01/31/2019 1048   ALT 36 01/31/2019 1048   BILITOT <0.2 (L) 01/31/2019 1048       RADIOGRAPHIC STUDIES: Ct Chest W Contrast  Result Date: 02/19/2019 CLINICAL DATA:  RIGHT lung  cancer diagnosed 2019 with bone metastasis. Chemotherapy in progress. EXAM: CT CHEST, ABDOMEN, AND PELVIS WITH CONTRAST TECHNIQUE: Multidetector CT imaging of the chest, abdomen and pelvis was performed following the standard protocol during bolus administration of intravenous contrast. CONTRAST:  174mL OMNIPAQUE IOHEXOL 300 MG/ML  SOLN COMPARISON:  MRI 01/01/2019, CT 12/18/2018, PET-CT 03/05/2018 FINDINGS: CT CHEST FINDINGS Cardiovascular: Port in the anterior chest wall with tip in distal SVC. No significant vascular findings. Normal heart size. No pericardial effusion. Mediastinum/Nodes: No axillary or supraclavicular adenopathy. No mediastinal hilar adenopathy. No pericardial effusion. Lungs/Pleura: Post radiation change in the RIGHT upper lobe. Band of consolidation with air bronchograms extending from the RIGHT hilum posterolaterally. Within the periphery of the consolidated pattern in 26 mm x 17 mm ovoid intermediate density lesion decreased from 32 x 19 mm on prior. Small RIGHT effusion is increased Musculoskeletal: No aggressive osseous lesion. CT ABDOMEN AND PELVIS FINDINGS Hepatobiliary: No focal hepatic lesion. No biliary ductal dilatation. Gallbladder is normal. Common bile duct is normal. Pancreas: Pancreas is normal. No ductal dilatation. No pancreatic inflammation. Spleen: Normal spleen Adrenals/urinary tract: Adrenal glands and kidneys are normal.  The ureters and bladder normal. Stomach/Bowel: Stomach, small bowel, appendix, and cecum are normal. Large volume stool in the ascending colon. Transverse colon descending colon normal. Vascular/Lymphatic: Abdominal aorta normal caliber. There is a rim of low-attenuation tissue anterior to the L2 vertebral body and posterior the aorta measuring 12 mm in depth. This compares to 18 mm in depth on CT. This collection related to a metastatic lesion at L2 described on comparison MRI. No retroperitoneal adenopathy.  No iliac adenopathy Reproductive: Uterus and  necks are normal. Other: No free fluid. Musculoskeletal: Again demonstrated metastatic lesion at L2 with associated compression fracture not changed from comparison MRI. Evidence of involvement of the L1 and L3 vertebral bodies as seen on comparison MRI. Soft tissue anterior to the metastatic lesions deep to the aorta is decreased in depth measuring 13 mm in axial dimension compared 18 on comparison. Stable sclerotic lesion the RIGHT sacral ala measures 19 mm. IMPRESSION: Chest Impression: 1. Post radiation change in the RIGHT upper lobe without evidence local recurrence. Rounded portion within the periphery of the post treatment consolidation is decreased in size. 2. No evidence of disease recurrence or progression in thorax. 3. New small RIGHT pleural effusion. Abdomen / Pelvis Impression: 1. No evidence of disease progression the abdomen pelvis. 2. Stable metastatic lesion at L2 with pathologic fracture. Additional involvement of L1-L3 as seen on comparison MRI. Soft tissue extension in the retroperitoneum is slightly decreased in thickness from comparison MRI and CT. No evidence of new metastatic skeletal disease. 3. Stable sclerotic lesion in the RIGHT sacral ala. 4. LEFT adrenal gland normal size. Electronically Signed   By: Suzy Bouchard M.D.   On: 02/19/2019 10:25   Ct Abdomen Pelvis W Contrast  Result Date: 02/19/2019 CLINICAL DATA:  RIGHT lung cancer diagnosed 2019 with bone metastasis. Chemotherapy in progress. EXAM: CT CHEST, ABDOMEN, AND PELVIS WITH CONTRAST TECHNIQUE: Multidetector CT imaging of the chest, abdomen and pelvis was performed following the standard protocol during bolus administration of intravenous contrast. CONTRAST:  127mL OMNIPAQUE IOHEXOL 300 MG/ML  SOLN COMPARISON:  MRI 01/01/2019, CT 12/18/2018, PET-CT 03/05/2018 FINDINGS: CT CHEST FINDINGS Cardiovascular: Port in the anterior chest wall with tip in distal SVC. No significant vascular findings. Normal heart size. No  pericardial effusion. Mediastinum/Nodes: No axillary or supraclavicular adenopathy. No mediastinal hilar adenopathy. No pericardial effusion. Lungs/Pleura: Post radiation change in the RIGHT upper lobe. Band of consolidation with air bronchograms extending from the RIGHT hilum posterolaterally. Within the periphery of the consolidated pattern in 26 mm x 17 mm ovoid intermediate density lesion decreased from 32 x 19 mm on prior. Small RIGHT effusion is increased Musculoskeletal: No aggressive osseous lesion. CT ABDOMEN AND PELVIS FINDINGS Hepatobiliary: No focal hepatic lesion. No biliary ductal dilatation. Gallbladder is normal. Common bile duct is normal. Pancreas: Pancreas is normal. No ductal dilatation. No pancreatic inflammation. Spleen: Normal spleen Adrenals/urinary tract: Adrenal glands and kidneys are normal. The ureters and bladder normal. Stomach/Bowel: Stomach, small bowel, appendix, and cecum are normal. Large volume stool in the ascending colon. Transverse colon descending colon normal. Vascular/Lymphatic: Abdominal aorta normal caliber. There is a rim of low-attenuation tissue anterior to the L2 vertebral body and posterior the aorta measuring 12 mm in depth. This compares to 18 mm in depth on CT. This collection related to a metastatic lesion at L2 described on comparison MRI. No retroperitoneal adenopathy.  No iliac adenopathy Reproductive: Uterus and necks are normal. Other: No free fluid. Musculoskeletal: Again demonstrated metastatic lesion at L2 with associated  compression fracture not changed from comparison MRI. Evidence of involvement of the L1 and L3 vertebral bodies as seen on comparison MRI. Soft tissue anterior to the metastatic lesions deep to the aorta is decreased in depth measuring 13 mm in axial dimension compared 18 on comparison. Stable sclerotic lesion the RIGHT sacral ala measures 19 mm. IMPRESSION: Chest Impression: 1. Post radiation change in the RIGHT upper lobe without  evidence local recurrence. Rounded portion within the periphery of the post treatment consolidation is decreased in size. 2. No evidence of disease recurrence or progression in thorax. 3. New small RIGHT pleural effusion. Abdomen / Pelvis Impression: 1. No evidence of disease progression the abdomen pelvis. 2. Stable metastatic lesion at L2 with pathologic fracture. Additional involvement of L1-L3 as seen on comparison MRI. Soft tissue extension in the retroperitoneum is slightly decreased in thickness from comparison MRI and CT. No evidence of new metastatic skeletal disease. 3. Stable sclerotic lesion in the RIGHT sacral ala. 4. LEFT adrenal gland normal size. Electronically Signed   By: Suzy Bouchard M.D.   On: 02/19/2019 10:25    ASSESSMENT AND PLAN: This is a very pleasant 63 years old white female with metastatic poorly differentiated malignant neoplasm of unknown primary at this point but according to the pathologist at the cancer center it is likely poorly differentiated non-small cell carcinoma of lung primary. 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 PDL 1 expression was 99%. The molecular studies by [facility] as well as [employer] showed no actionable mutations. The patient was a started on treatment with systemic chemotherapy with carboplatin for AUC of 5, Alimta 500 mg/M2 and Keytruda 200 mg IV every 3 weeks.  She is status post 4 cycles with partial response.  The patient was started on maintenance treatment with Alimta and Keytruda status post 11 cycles. The patient continues to tolerate this treatment well except for the fatigue secondary to chemotherapy-induced anemia. She had repeat CT scan of the chest, abdomen pelvis performed recently.  I personally and independently reviewed the scans and discussed the results with the patient today. PET scan showed no concerning findings for disease progression. I recommended for the patient to  continue her current maintenance treatment with Alimta and Keytruda. For the back pain, she underwent palliative radiotherapy to the metastatic lesion in the lumbar spine and she is feeling much better. For pain management she decided to stop fentanyl patch and continue on tramadol on as-needed basis.  She is requesting refill of tramadol to be done every 6 hours.  She still have enough for another week. For the anemia, we will continue to monitor her hemoglobin and hematocrit closely and consider the patient for transfusion if her hemoglobin is less than 70 g/dl The patient voices understanding of current disease status and treatment options and is in agreement with the current care plan. All questions were answered. The patient knows to call the clinic with any problems, questions or concerns. We can certainly see the patient much sooner if necessary.  Disclaimer: This note was dictated with voice recognition software. Similar sounding words can inadvertently be transcribed and may not be corrected upon review.

## 2019-02-27 ENCOUNTER — Ambulatory Visit
Admission: RE | Admit: 2019-02-27 | Discharge: 2019-02-27 | Disposition: A | Discharge status: Home / Self Care | Payer: 59 | Source: Ambulatory Visit | Attending: Urology | Admitting: Urology

## 2019-02-27 ENCOUNTER — Other Ambulatory Visit: Payer: Self-pay

## 2019-02-27 DIAGNOSIS — C7949 Secondary malignant neoplasm of other parts of nervous system: Secondary | ICD-10-CM

## 2019-02-27 NOTE — Progress Notes (Signed)
Radiation Oncology         [phone]) [phone] ________________________________  Name: Rebecca Lucas MRN: [id number]  Date: 02/27/2019  DOB: 17-Aug-1956  Post-treatment Note- Conducted via telephone due to current COVID-19 concerns for limiting patient exposure  CC: Donald Prose, MD  Curt Bears, MD  Diagnosis:   63 y.o. 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  11/13/2018, 11/15/2018, 11/19/2018, 11/21/2018, 11/23/2018//SBRT:   50 Gy in 5 fractions at 10 Gy per fraction to the tumor in the left adrenal gland and the left periaortic node   03/29/18 - 04/20/18 : 1. 30 Gy directed to the RIGHT hip delivered in 10 fractions 2. 30 Gy directed to the RIGHT lung delivered in 10 fractions  03/21/2018, 03/23/2018, 03/26/2018, 03/28/2018, 03/30/2018//SBRT:   The cervical spine (C2-3) was treated to 50 Gy in 5 fractions of 10 Gy.  Narrative:  I spoke with the patient to conduct her routine scheduled 1 month follow up visit via telephone to spare the patient unnecessary potential exposure in the healthcare setting during the current COVID-19 pandemic.  The patient was notified in advance and gave permission to proceed with this visit format.  In summary, she was initially diagnosed with metastatic, poorly differentiated neoplasm, questionable for early NSCLC, presenting with a large right upper lobe lung mass and osseous metastases.  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  She elected to proceed with palliative radiation to the cervical spine, right hip and right lung which was completed in July 2019 and tolerated well.  She did experience significant improvement in her neck and hip  pain after completion of radiotherapy.  She is status post 4 cycles of systemic therapy with carboplatin/Alimta/Keytruda under the care and direction of Dr. Earlie Server with a partial response and was transitioned to maintenance treatment with Alimta/Keytruda on 07/04/18, now status post 10 cycles.  She has continued to tolerate treatment well.  On a follow up CT C/A/P on 10/16/2018 for disease restaging whe was noted to have a mixed response to treatment.  Her neck CT showed no evidence of progressive disease but CT C/A/P however, revealed significant increase in size of the left adrenal metastatic lesion, measuring 3cm as compared to 1.6 cm in 07/2018 and a 40mm increase in size of a left periaortic node, now measuring 1.1 cm.  The previously treated right upper lobe mass had decreased in size.  She elected to proceed with palliative radiotherapy/SBRT to the progressive metastases in the left adrenal gland and a left periaortic node which was completed on 11/23/18. She developed persistent central low back pain radiating into the L>R flanks and down the left leg with occasional mild numbness/tingling from hip to foot on the left.  On follow up systemic imaging with CT C/A/P on 12/18/18, there was stable scattered sclerotic osseous lesions but there was a low density anterior paraspinal fluid collection close to the known L2 fracture of unclear etiology but possibly concerning for discitis which prompted further evaluation with lumbar MRI on 01/01/19 given the patient's persistent low back pain.  MRI showed metastatic disease involving the L1, L2 and L3 vertebral bodies with severe pathologic compression fracture of the L2 vertebral body with approximately  90% anterior height loss. No discitis or osteomyelitis but anterior paravertebral soft tissue component centered at L2 extending superiorly towards L1 and inferiorly towards L3 with heterogeneous enhancement most concerning for extra osseous extension of tumor. There was  small epidural tumor along the posterior aspect of the L2 vertebral body with left foraminal extension. Additionally ,there was metastatic disease involving the left posterior ilium, right sacrum and right ilium most consistent with metastatic disease and a pathologic nondisplaced fracture of the right sacral ala.  She was recently treated with SBRT to L1-L3 and bilateral SI jts in 01/2019 which was tolerated very well.  She continues on maintenance systemic therapy with Alimta and Keytruda under the care and direction of Dr. Julien Nordmann with recent restaging systemic imaging from 02/19/19 indicating stable disease.  On review of systems, the patient states that she is doing well overall. She reports significant improvement in her low back pain and hips bilaterally.  She has been able to discontinue the Fentanyl patch and  is now able to manage her pain with alternating doses of Tramadol, tylenol and ibuprofen. She has been able to resume activities with minimal pain.  She specifically denies chest pain, increased shortness of breath, hemoptysis, productive cough, fever, chills or night sweats.  She reports a decent appetite and is maintaining her weight.  She denies abdominal pain, nausea, vomiting, diarrhea or constipation.  She has not had recent headaches, changes in visual or auditory acuity, dizziness, imbalance or tremor.  ALLERGIES:  has No Known Allergies.  Meds: Current Outpatient Medications  Medication Sig Dispense Refill   acetaminophen (TYLENOL) 500 MG tablet Take 1,000 mg by mouth every 6 (six) hours as needed for mild pain.      alendronate (FOSAMAX) 70 MG tablet Take 70 mg by mouth once a week. Wednesday  3   butalbital-aspirin-caffeine (FIORINAL) 50-325-40 MG capsule Take 1 capsule by mouth every 6 (six) hours as needed for migraine.   1   Calcium Carbonate-Vitamin D (CALCIUM-D PO) Take 1 tablet by mouth 2 (two) times daily.     folic acid (FOLVITE) 1 MG tablet Take 1 tablet (1 mg total)  by mouth daily. 90 tablet 1   ibuprofen (ADVIL,MOTRIN) 200 MG tablet Take 600 mg by mouth every 6 (six) hours as needed for mild pain.      lidocaine-prilocaine (EMLA) cream Apply 1 application topically as needed. 30 g 0   LINZESS 72 MCG capsule TAKE 1 CAPSULE (72 MCG TOTAL) BY MOUTH DAILY BEFORE BREAKFAST. 30 capsule 0   LUTEIN PO Take 1 tablet by mouth daily.     omeprazole (PRILOSEC) 20 MG capsule Take 1 capsule (20 mg total) by mouth daily. 90 capsule 1   ondansetron (ZOFRAN) 8 MG tablet TAKE 1 TABLET (8 MG TOTAL) BY MOUTH EVERY 8 HOURS AS NEEDED FOR NAUSEA OR VOMITING. 20 tablet 0   traMADol (ULTRAM) 50 MG tablet Take 1 tablet (50 mg total) by mouth every 12 (twelve) hours as needed for moderate pain. 60 tablet 0   zolpidem (AMBIEN) 10 MG tablet Take 1 tablet (10 mg total) by mouth at bedtime as needed for sleep. 30 tablet 0   No current facility-administered medications for this encounter.     Physical Findings:  vitals were not taken for this visit.   /Unable to assess due to telephone follow up visit format.  Lab Findings: Lab Results  Component Value Date   WBC 11.7 (H) 02/21/2019   HGB 7.9 (L) 02/21/2019   HCT  26.3 (L) 02/21/2019   MCV 97.0 02/21/2019   PLT 506 (H) 02/21/2019     Radiographic Findings: Ct Chest W Contrast  Result Date: 02/19/2019 CLINICAL DATA:  RIGHT lung cancer diagnosed 2019 with bone metastasis. Chemotherapy in progress. EXAM: CT CHEST, ABDOMEN, AND PELVIS WITH CONTRAST TECHNIQUE: Multidetector CT imaging of the chest, abdomen and pelvis was performed following the standard protocol during bolus administration of intravenous contrast. CONTRAST:  142mL OMNIPAQUE IOHEXOL 300 MG/ML  SOLN COMPARISON:  MRI 01/01/2019, CT 12/18/2018, PET-CT 03/05/2018 FINDINGS: CT CHEST FINDINGS Cardiovascular: Port in the anterior chest wall with tip in distal SVC. No significant vascular findings. Normal heart size. No pericardial effusion. Mediastinum/Nodes: No  axillary or supraclavicular adenopathy. No mediastinal hilar adenopathy. No pericardial effusion. Lungs/Pleura: Post radiation change in the RIGHT upper lobe. Band of consolidation with air bronchograms extending from the RIGHT hilum posterolaterally. Within the periphery of the consolidated pattern in 26 mm x 17 mm ovoid intermediate density lesion decreased from 32 x 19 mm on prior. Small RIGHT effusion is increased Musculoskeletal: No aggressive osseous lesion. CT ABDOMEN AND PELVIS FINDINGS Hepatobiliary: No focal hepatic lesion. No biliary ductal dilatation. Gallbladder is normal. Common bile duct is normal. Pancreas: Pancreas is normal. No ductal dilatation. No pancreatic inflammation. Spleen: Normal spleen Adrenals/urinary tract: Adrenal glands and kidneys are normal. The ureters and bladder normal. Stomach/Bowel: Stomach, small bowel, appendix, and cecum are normal. Large volume stool in the ascending colon. Transverse colon descending colon normal. Vascular/Lymphatic: Abdominal aorta normal caliber. There is a rim of low-attenuation tissue anterior to the L2 vertebral body and posterior the aorta measuring 12 mm in depth. This compares to 18 mm in depth on CT. This collection related to a metastatic lesion at L2 described on comparison MRI. No retroperitoneal adenopathy.  No iliac adenopathy Reproductive: Uterus and necks are normal. Other: No free fluid. Musculoskeletal: Again demonstrated metastatic lesion at L2 with associated compression fracture not changed from comparison MRI. Evidence of involvement of the L1 and L3 vertebral bodies as seen on comparison MRI. Soft tissue anterior to the metastatic lesions deep to the aorta is decreased in depth measuring 13 mm in axial dimension compared 18 on comparison. Stable sclerotic lesion the RIGHT sacral ala measures 19 mm. IMPRESSION: Chest Impression: 1. Post radiation change in the RIGHT upper lobe without evidence local recurrence. Rounded portion within  the periphery of the post treatment consolidation is decreased in size. 2. No evidence of disease recurrence or progression in thorax. 3. New small RIGHT pleural effusion. Abdomen / Pelvis Impression: 1. No evidence of disease progression the abdomen pelvis. 2. Stable metastatic lesion at L2 with pathologic fracture. Additional involvement of L1-L3 as seen on comparison MRI. Soft tissue extension in the retroperitoneum is slightly decreased in thickness from comparison MRI and CT. No evidence of new metastatic skeletal disease. 3. Stable sclerotic lesion in the RIGHT sacral ala. 4. LEFT adrenal gland normal size. Electronically Signed   By: Suzy Bouchard M.D.   On: 02/19/2019 10:25   Ct Abdomen Pelvis W Contrast  Result Date: 02/19/2019 CLINICAL DATA:  RIGHT lung cancer diagnosed 2019 with bone metastasis. Chemotherapy in progress. EXAM: CT CHEST, ABDOMEN, AND PELVIS WITH CONTRAST TECHNIQUE: Multidetector CT imaging of the chest, abdomen and pelvis was performed following the standard protocol during bolus administration of intravenous contrast. CONTRAST:  161mL OMNIPAQUE IOHEXOL 300 MG/ML  SOLN COMPARISON:  MRI 01/01/2019, CT 12/18/2018, PET-CT 03/05/2018 FINDINGS: CT CHEST FINDINGS Cardiovascular: Port in the anterior chest wall with  tip in distal SVC. No significant vascular findings. Normal heart size. No pericardial effusion. Mediastinum/Nodes: No axillary or supraclavicular adenopathy. No mediastinal hilar adenopathy. No pericardial effusion. Lungs/Pleura: Post radiation change in the RIGHT upper lobe. Band of consolidation with air bronchograms extending from the RIGHT hilum posterolaterally. Within the periphery of the consolidated pattern in 26 mm x 17 mm ovoid intermediate density lesion decreased from 32 x 19 mm on prior. Small RIGHT effusion is increased Musculoskeletal: No aggressive osseous lesion. CT ABDOMEN AND PELVIS FINDINGS Hepatobiliary: No focal hepatic lesion. No biliary ductal  dilatation. Gallbladder is normal. Common bile duct is normal. Pancreas: Pancreas is normal. No ductal dilatation. No pancreatic inflammation. Spleen: Normal spleen Adrenals/urinary tract: Adrenal glands and kidneys are normal. The ureters and bladder normal. Stomach/Bowel: Stomach, small bowel, appendix, and cecum are normal. Large volume stool in the ascending colon. Transverse colon descending colon normal. Vascular/Lymphatic: Abdominal aorta normal caliber. There is a rim of low-attenuation tissue anterior to the L2 vertebral body and posterior the aorta measuring 12 mm in depth. This compares to 18 mm in depth on CT. This collection related to a metastatic lesion at L2 described on comparison MRI. No retroperitoneal adenopathy.  No iliac adenopathy Reproductive: Uterus and necks are normal. Other: No free fluid. Musculoskeletal: Again demonstrated metastatic lesion at L2 with associated compression fracture not changed from comparison MRI. Evidence of involvement of the L1 and L3 vertebral bodies as seen on comparison MRI. Soft tissue anterior to the metastatic lesions deep to the aorta is decreased in depth measuring 13 mm in axial dimension compared 18 on comparison. Stable sclerotic lesion the RIGHT sacral ala measures 19 mm. IMPRESSION: Chest Impression: 1. Post radiation change in the RIGHT upper lobe without evidence local recurrence. Rounded portion within the periphery of the post treatment consolidation is decreased in size. 2. No evidence of disease recurrence or progression in thorax. 3. New small RIGHT pleural effusion. Abdomen / Pelvis Impression: 1. No evidence of disease progression the abdomen pelvis. 2. Stable metastatic lesion at L2 with pathologic fracture. Additional involvement of L1-L3 as seen on comparison MRI. Soft tissue extension in the retroperitoneum is slightly decreased in thickness from comparison MRI and CT. No evidence of new metastatic skeletal disease. 3. Stable sclerotic  lesion in the RIGHT sacral ala. 4. LEFT adrenal gland normal size. Electronically Signed   By: Suzy Bouchard M.D.   On: 02/19/2019 10:25    Impression/Plan:  This visit was conducted via telephone to spare the patient unnecessary potential exposure in the healthcare setting during the current COVID-19 pandemic.  1.  63 y.o. 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 She has recovered well from the effects of recent radiotherapy and has had significant improvement in her low back and hip pain.  She has been able to discontinue use of Fentanyl patch and is managing her pain with Tramadol alternating with Tylenol and Advil prn.   We discussed that while we are happy to continue to participate in her care if clinically indicated, at this point, we will plan to see her back on an as-needed basis.  She will continue in routine follow-up for continued disease management under the care and direction of Dr. Earlie Server.  The current plan is for the patient to remain on her current maintenance treatment with Alimta and Keytruda.  She will continue with serial systemic imaging and prefers to only have repeat MRI scans  should she develop progressive symptoms or need further evaluation for any concerning findings on her CT scans.  She appears to have a good understanding of her overall disease and our recommendations and is comfortable and in agreement with the stated plan.  She knows to call at anytime with any questions or concerns related to her previous radiotherapy.   Nicholos Johns, MMS, PA-C [facility]: [phone]   Fax: [phone] .

## 2019-02-27 NOTE — Addendum Note (Signed)
Encounter addended by: Freeman Caldron, PA-C on: 02/27/2019 5:28 PM  Actions taken: Level of Service modified

## 2019-03-06 ENCOUNTER — Other Ambulatory Visit: Payer: Self-pay | Admitting: Internal Medicine

## 2019-03-06 ENCOUNTER — Telehealth: Payer: Self-pay | Admitting: Medical Oncology

## 2019-03-06 DIAGNOSIS — C7951 Secondary malignant neoplasm of bone: Secondary | ICD-10-CM

## 2019-03-06 DIAGNOSIS — C799 Secondary malignant neoplasm of unspecified site: Secondary | ICD-10-CM

## 2019-03-06 MED ORDER — TRAMADOL HCL 50 MG PO TABS: 50.0000 mg | ORAL_TABLET | Freq: Four times a day (QID) | ORAL | 0 refills | Status: DC | PRN

## 2019-03-06 NOTE — Telephone Encounter (Signed)
Requests refill and due to her increased  pain she is needing more than every 12 hours as currently prescribed.

## 2019-03-06 NOTE — Telephone Encounter (Signed)
She keeps changing her pain medication and frequency.  I will send the prescription with every 6 hours frequency but not comfortable with the many changes she is making.  Thank you.

## 2019-03-07 ENCOUNTER — Other Ambulatory Visit: Payer: Self-pay | Admitting: Internal Medicine

## 2019-03-07 ENCOUNTER — Other Ambulatory Visit: Payer: Self-pay | Admitting: Medical Oncology

## 2019-03-07 MED FILL — traMADol HCL 50 MG TABS: 50 | 30 days supply | Qty: 120 | Fill #0

## 2019-03-07 MED FILL — LINZESS 72 MCG CAPSULE: 72 | 30 days supply | Qty: 30 | Fill #0

## 2019-03-13 DIAGNOSIS — Z0000 Encounter for general adult medical examination without abnormal findings: Secondary | ICD-10-CM | POA: Diagnosis not present

## 2019-03-13 DIAGNOSIS — Z13220 Encounter for screening for lipoid disorders: Secondary | ICD-10-CM | POA: Diagnosis not present

## 2019-03-14 ENCOUNTER — Inpatient Hospital Stay: Payer: 59

## 2019-03-14 ENCOUNTER — Other Ambulatory Visit: Payer: Self-pay

## 2019-03-14 ENCOUNTER — Encounter: Payer: Self-pay | Admitting: Internal Medicine

## 2019-03-14 ENCOUNTER — Inpatient Hospital Stay (HOSPITAL_BASED_OUTPATIENT_CLINIC_OR_DEPARTMENT_OTHER): Payer: 59 | Admitting: Internal Medicine

## 2019-03-14 VITALS — BP 129/74 | HR 124 | Temp 98.9°F | Resp 18 | Ht 67.0 in | Wt 116.9 lb

## 2019-03-14 VITALS — HR 107

## 2019-03-14 DIAGNOSIS — D649 Anemia, unspecified: Secondary | ICD-10-CM

## 2019-03-14 DIAGNOSIS — Z510 Encounter for antineoplastic radiation therapy: Secondary | ICD-10-CM | POA: Diagnosis not present

## 2019-03-14 DIAGNOSIS — M549 Dorsalgia, unspecified: Secondary | ICD-10-CM

## 2019-03-14 DIAGNOSIS — C801 Malignant (primary) neoplasm, unspecified: Secondary | ICD-10-CM

## 2019-03-14 DIAGNOSIS — C7972 Secondary malignant neoplasm of left adrenal gland: Secondary | ICD-10-CM

## 2019-03-14 DIAGNOSIS — C3490 Malignant neoplasm of unspecified part of unspecified bronchus or lung: Secondary | ICD-10-CM

## 2019-03-14 DIAGNOSIS — C7949 Secondary malignant neoplasm of other parts of nervous system: Secondary | ICD-10-CM | POA: Insufficient documentation

## 2019-03-14 DIAGNOSIS — Z5111 Encounter for antineoplastic chemotherapy: Secondary | ICD-10-CM | POA: Insufficient documentation

## 2019-03-14 DIAGNOSIS — C7951 Secondary malignant neoplasm of bone: Secondary | ICD-10-CM | POA: Insufficient documentation

## 2019-03-14 DIAGNOSIS — C3411 Malignant neoplasm of upper lobe, right bronchus or lung: Secondary | ICD-10-CM | POA: Diagnosis not present

## 2019-03-14 DIAGNOSIS — C7801 Secondary malignant neoplasm of right lung: Secondary | ICD-10-CM | POA: Diagnosis not present

## 2019-03-14 DIAGNOSIS — Z5112 Encounter for antineoplastic immunotherapy: Secondary | ICD-10-CM | POA: Insufficient documentation

## 2019-03-14 DIAGNOSIS — C799 Secondary malignant neoplasm of unspecified site: Secondary | ICD-10-CM

## 2019-03-14 DIAGNOSIS — Z79899 Other long term (current) drug therapy: Secondary | ICD-10-CM | POA: Insufficient documentation

## 2019-03-14 DIAGNOSIS — M545 Low back pain: Secondary | ICD-10-CM | POA: Diagnosis not present

## 2019-03-14 LAB — CBC WITH DIFFERENTIAL (CANCER CENTER ONLY)
Abs Immature Granulocytes: 0.44 10*3/uL — ABNORMAL HIGH (ref 0.00–0.07)
Basophils Absolute: 0.1 10*3/uL (ref 0.0–0.1)
Basophils Relative: 0 %
Eosinophils Absolute: 0.2 10*3/uL (ref 0.0–0.5)
Eosinophils Relative: 1 %
HCT: 27.6 % — ABNORMAL LOW (ref 36.0–46.0)
Hemoglobin: 8.2 g/dL — ABNORMAL LOW (ref 12.0–15.0)
Immature Granulocytes: 2 %
Lymphocytes Relative: 1 %
Lymphs Abs: 0.2 10*3/uL — ABNORMAL LOW (ref 0.7–4.0)
MCH: 29.3 pg (ref 26.0–34.0)
MCHC: 29.7 g/dL — ABNORMAL LOW (ref 30.0–36.0)
MCV: 98.6 fL (ref 80.0–100.0)
Monocytes Absolute: 1.2 10*3/uL — ABNORMAL HIGH (ref 0.1–1.0)
Monocytes Relative: 6 %
Neutro Abs: 18 10*3/uL — ABNORMAL HIGH (ref 1.7–7.7)
Neutrophils Relative %: 90 %
Platelet Count: 634 10*3/uL — ABNORMAL HIGH (ref 150–400)
RBC: 2.8 MIL/uL — ABNORMAL LOW (ref 3.87–5.11)
RDW: 18 % — ABNORMAL HIGH (ref 11.5–15.5)
WBC Count: 20.1 10*3/uL — ABNORMAL HIGH (ref 4.0–10.5)
nRBC: 0 % (ref 0.0–0.2)

## 2019-03-14 LAB — CMP (CANCER CENTER ONLY)
ALT: 50 U/L — ABNORMAL HIGH (ref 0–44)
AST: 56 U/L — ABNORMAL HIGH (ref 15–41)
Albumin: 2.2 g/dL — ABNORMAL LOW (ref 3.5–5.0)
Alkaline Phosphatase: 534 U/L — ABNORMAL HIGH (ref 38–126)
Anion gap: 12 (ref 5–15)
BUN: 12 mg/dL (ref 8–23)
CO2: 24 mmol/L (ref 22–32)
Calcium: 9.2 mg/dL (ref 8.9–10.3)
Chloride: 98 mmol/L (ref 98–111)
Creatinine: 0.74 mg/dL (ref 0.44–1.00)
GFR, Est AFR Am: >60 mL/min (ref >60)
GFR, Estimated: >60 mL/min (ref >60)
Glucose, Bld: 104 mg/dL — ABNORMAL HIGH (ref 70–99)
Potassium: 4.6 mmol/L (ref 3.5–5.1)
Sodium: 134 mmol/L — ABNORMAL LOW (ref 135–145)
Total Bilirubin: <0.2 mg/dL — ABNORMAL LOW (ref 0.3–1.2)
Total Protein: 6.9 g/dL (ref 6.5–8.1)

## 2019-03-14 LAB — TSH: TSH: 1.955 u[IU]/mL (ref 0.308–3.960)

## 2019-03-14 MED ORDER — ALTEPLASE 2 MG IJ SOLR
INTRAMUSCULAR | Status: AC
  Filled 2019-03-14: qty 2

## 2019-03-14 MED ORDER — SODIUM CHLORIDE 0.9 % IV SOLN
490.0000 mg/m2 | Freq: Once | INTRAVENOUS | Status: AC
  Administered 2019-03-14: 800 mg via INTRAVENOUS
  Filled 2019-03-14: qty 20

## 2019-03-14 MED ORDER — DEXAMETHASONE SODIUM PHOSPHATE 10 MG/ML IJ SOLN
INTRAMUSCULAR | Status: AC
  Filled 2019-03-14: qty 1

## 2019-03-14 MED ORDER — DEXAMETHASONE SODIUM PHOSPHATE 10 MG/ML IJ SOLN
10.0000 mg | Freq: Once | INTRAMUSCULAR | Status: AC
  Administered 2019-03-14: 10 mg via INTRAVENOUS

## 2019-03-14 MED ORDER — ONDANSETRON HCL 4 MG/2ML IJ SOLN
8.0000 mg | Freq: Once | INTRAMUSCULAR | Status: AC
  Administered 2019-03-14: 8 mg via INTRAVENOUS

## 2019-03-14 MED ORDER — SODIUM CHLORIDE 0.9% FLUSH
10.0000 mL | INTRAVENOUS | Status: DC | PRN
  Administered 2019-03-14: 10 mL
  Filled 2019-03-14: qty 10

## 2019-03-14 MED ORDER — HEPARIN SOD (PORK) LOCK FLUSH 100 UNIT/ML IV SOLN
500.0000 [IU] | Freq: Once | INTRAVENOUS | Status: AC | PRN
  Administered 2019-03-14: 500 [IU]
  Filled 2019-03-14: qty 5

## 2019-03-14 MED ORDER — ONDANSETRON HCL 4 MG/2ML IJ SOLN
INTRAMUSCULAR | Status: AC
  Filled 2019-03-14: qty 4

## 2019-03-14 MED ORDER — SODIUM CHLORIDE 0.9 % IV SOLN
Freq: Once | INTRAVENOUS | Status: AC
  Administered 2019-03-14: 13:00:00 via INTRAVENOUS
  Filled 2019-03-14: qty 250

## 2019-03-14 MED ORDER — ALTEPLASE 2 MG IJ SOLR
2.0000 mg | Freq: Once | INTRAMUSCULAR | Status: AC | PRN
  Administered 2019-03-14: 2 mg
  Filled 2019-03-14: qty 2

## 2019-03-14 MED ORDER — SODIUM CHLORIDE 0.9 % IV SOLN
200.0000 mg | Freq: Once | INTRAVENOUS | Status: AC
  Administered 2019-03-14: 200 mg via INTRAVENOUS
  Filled 2019-03-14: qty 8

## 2019-03-14 NOTE — Progress Notes (Signed)
OK to tx w/ pulse 107 per Dr. Earlie Server

## 2019-03-14 NOTE — Patient Instructions (Signed)
[facility] Discharge Instructions for Patients Receiving Chemotherapy  Today you received the following chemotherapy agents: Pembrolizumab (Keytruda) and Pemetrexed (Alimta)  To help prevent nausea and vomiting after your treatment, we encourage you to take your nausea medication as directed.    If you develop nausea and vomiting that is not controlled by your nausea medication, call the clinic.   BELOW ARE SYMPTOMS THAT SHOULD BE REPORTED IMMEDIATELY:  *FEVER GREATER THAN 100.5 F  *CHILLS WITH OR WITHOUT FEVER  NAUSEA AND VOMITING THAT IS NOT CONTROLLED WITH YOUR NAUSEA MEDICATION  *UNUSUAL SHORTNESS OF BREATH  *UNUSUAL BRUISING OR BLEEDING  TENDERNESS IN MOUTH AND THROAT WITH OR WITHOUT PRESENCE OF ULCERS  *URINARY PROBLEMS  *BOWEL PROBLEMS  UNUSUAL RASH Items with * indicate a potential emergency and should be followed up as soon as possible.  Feel free to call the clinic should you have any questions or concerns. The clinic phone number is [phone].  Please show the [facility] at check-in to the Emergency Department and triage nurse.

## 2019-03-14 NOTE — Progress Notes (Signed)
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  Metastatic sarcoma cannot be also excluded at this point. Molecular studies by foundation 1 showed no actionable mutations. PDL 1 expression 99%  PRIOR THERAPY: 1) Palliative radiotherapy to the neck mass and hip lesion under the care of Dr. Tammi Klippel  2) palliative radiotherapy to the left adrenal gland and periaortic lymph node under the care of Dr. Tammi Klippel.  CURRENT THERAPY: Systemic chemotherapy with carboplatin for AUC of 5, Alimta 500 mg/M2 and Keytruda 200 mg IV every 3 weeks.  First dose was April 10, 2018.  Status post 16 cycles.  Starting from cycle #5 the patient is on maintenance treatment with Alimta and Keytruda.  INTERVAL HISTORY: Rebecca Lucas 63 y.o. female returns to the clinic today for follow-up visit.  The patient continues to have low back pain with radiation to the left leg.  She is currently on tramadol with good control of her pain except for the recent referred pain to the leg.  She denied having any current chest pain, shortness of breath, cough or hemoptysis.  She denied having any fever or chills.  She has no nausea, vomiting, diarrhea or constipation.  She has no headache or visual changes.  She is here today for evaluation before starting cycle #17 of her treatment.  MEDICAL HISTORY:No past medical history on file.  ALLERGIES:  has No Known Allergies.  MEDICATIONS:  Current Outpatient Medications  Medication Sig Dispense Refill  . acetaminophen (TYLENOL) 500 MG tablet Take 1,000 mg by mouth every 6 (six) hours as needed for mild pain.     Marland Kitchen alendronate  (FOSAMAX) 70 MG tablet Take 70 mg by mouth once a week. Wednesday  3  . butalbital-aspirin-caffeine (FIORINAL) 50-325-40 MG capsule Take 1 capsule by mouth every 6 (six) hours as needed for migraine.   1  . Calcium Carbonate-Vitamin D (CALCIUM-D PO) Take 1 tablet by mouth 2 (two) times daily.    . folic acid (FOLVITE) 1 MG tablet Take 1 tablet (1 mg total) by mouth daily. 90 tablet 1  . ibuprofen (ADVIL,MOTRIN) 200 MG tablet Take 600 mg by mouth every 6 (six) hours as needed for mild pain.     Marland Kitchen lidocaine-prilocaine (EMLA) cream Apply 1 application topically as needed. 30 g 0  . LINZESS 72 MCG capsule TAKE 1 CAPSULE (72 MCG TOTAL) BY MOUTH DAILY BEFORE BREAKFAST. 30 capsule 0  . LUTEIN PO Take 1 tablet by mouth daily.    Marland Kitchen omeprazole (PRILOSEC) 20 MG capsule Take 1 capsule (20 mg total) by mouth daily. 90 capsule 1  . ondansetron (ZOFRAN) 8 MG tablet TAKE 1 TABLET (8 MG TOTAL) BY MOUTH EVERY 8 HOURS AS NEEDED FOR NAUSEA OR VOMITING. 20 tablet 0  . traMADol (ULTRAM) 50 MG tablet Take 1 tablet (50 mg total) by mouth every 6 (six) hours as needed for moderate pain. 120 tablet 0  . zolpidem (AMBIEN) 10 MG tablet Take 1 tablet (10 mg total) by mouth at bedtime as needed for sleep. 30 tablet 0   No current facility-administered medications for this visit.    Facility-Administered Medications Ordered in Other Visits  Medication Dose Route Frequency  Provider Last Rate Last Dose  . sodium chloride flush (NS) 0.9 % injection 10 mL  10 mL Intracatheter PRN Curt Bears, MD   10 mL at 03/14/19 1009    SURGICAL HISTORY:  Past Surgical History:  Procedure Laterality Date  . IR IMAGING GUIDED PORT INSERTION  06/26/2018    REVIEW OF SYSTEMS:  A comprehensive review of systems was negative except for: Constitutional: positive for fatigue Musculoskeletal: positive for back pain   PHYSICAL EXAMINATION: General appearance: alert, cooperative, fatigued and no distress Head: Normocephalic, without  obvious abnormality, atraumatic Neck: no adenopathy, no JVD, supple, symmetrical, trachea midline and thyroid not enlarged, symmetric, no tenderness/mass/nodules Lymph nodes: Cervical, supraclavicular, and axillary nodes normal. Resp: clear to auscultation bilaterally Back: symmetric, no curvature. ROM normal. No CVA tenderness. Cardio: regular rate and rhythm, S1, S2 normal, no murmur, click, rub or gallop GI: soft, non-tender; bowel sounds normal; no masses,  no organomegaly Extremities: extremities normal, atraumatic, no cyanosis or edema  ECOG PERFORMANCE STATUS: 1 - Symptomatic but completely ambulatory  Blood pressure 129/74, pulse (!) 124, temperature 98.9 F (37.2 C), temperature source Oral, resp. rate 18, height 5\' 7"  (1.702 m), weight 116 lb 14.4 oz (53 kg), SpO2 99 %.  LABORATORY DATA: Lab Results  Component Value Date   WBC 11.7 (H) 02/21/2019   HGB 7.9 (L) 02/21/2019   HCT 26.3 (L) 02/21/2019   MCV 97.0 02/21/2019   PLT 506 (H) 02/21/2019      Chemistry      Component Value Date/Time   NA 135 02/21/2019 1031   K 4.6 02/21/2019 1031   CL 99 02/21/2019 1031   CO2 25 02/21/2019 1031   BUN 11 02/21/2019 1031   CREATININE 0.74 02/21/2019 1031      Component Value Date/Time   CALCIUM 8.9 02/21/2019 1031   ALKPHOS 311 (H) 02/21/2019 1031   AST 47 (H) 02/21/2019 1031   ALT 41 02/21/2019 1031   BILITOT <0.2 (L) 02/21/2019 1031       RADIOGRAPHIC STUDIES: Ct Chest W Contrast  Result Date: 02/19/2019 CLINICAL DATA:  RIGHT lung cancer diagnosed 2019 with bone metastasis. Chemotherapy in progress. EXAM: CT CHEST, ABDOMEN, AND PELVIS WITH CONTRAST TECHNIQUE: Multidetector CT imaging of the chest, abdomen and pelvis was performed following the standard protocol during bolus administration of intravenous contrast. CONTRAST:  155mL OMNIPAQUE IOHEXOL 300 MG/ML  SOLN COMPARISON:  MRI 01/01/2019, CT 12/18/2018, PET-CT 03/05/2018 FINDINGS: CT CHEST FINDINGS Cardiovascular: Port  in the anterior chest wall with tip in distal SVC. No significant vascular findings. Normal heart size. No pericardial effusion. Mediastinum/Nodes: No axillary or supraclavicular adenopathy. No mediastinal hilar adenopathy. No pericardial effusion. Lungs/Pleura: Post radiation change in the RIGHT upper lobe. Band of consolidation with air bronchograms extending from the RIGHT hilum posterolaterally. Within the periphery of the consolidated pattern in 26 mm x 17 mm ovoid intermediate density lesion decreased from 32 x 19 mm on prior. Small RIGHT effusion is increased Musculoskeletal: No aggressive osseous lesion. CT ABDOMEN AND PELVIS FINDINGS Hepatobiliary: No focal hepatic lesion. No biliary ductal dilatation. Gallbladder is normal. Common bile duct is normal. Pancreas: Pancreas is normal. No ductal dilatation. No pancreatic inflammation. Spleen: Normal spleen Adrenals/urinary tract: Adrenal glands and kidneys are normal. The ureters and bladder normal. Stomach/Bowel: Stomach, small bowel, appendix, and cecum are normal. Large volume stool in the ascending colon. Transverse colon descending colon normal. Vascular/Lymphatic: Abdominal aorta normal caliber. There is a rim of low-attenuation tissue anterior to the L2 vertebral body  and posterior the aorta measuring 12 mm in depth. This compares to 18 mm in depth on CT. This collection related to a metastatic lesion at L2 described on comparison MRI. No retroperitoneal adenopathy.  No iliac adenopathy Reproductive: Uterus and necks are normal. Other: No free fluid. Musculoskeletal: Again demonstrated metastatic lesion at L2 with associated compression fracture not changed from comparison MRI. Evidence of involvement of the L1 and L3 vertebral bodies as seen on comparison MRI. Soft tissue anterior to the metastatic lesions deep to the aorta is decreased in depth measuring 13 mm in axial dimension compared 18 on comparison. Stable sclerotic lesion the RIGHT sacral ala  measures 19 mm. IMPRESSION: Chest Impression: 1. Post radiation change in the RIGHT upper lobe without evidence local recurrence. Rounded portion within the periphery of the post treatment consolidation is decreased in size. 2. No evidence of disease recurrence or progression in thorax. 3. New small RIGHT pleural effusion. Abdomen / Pelvis Impression: 1. No evidence of disease progression the abdomen pelvis. 2. Stable metastatic lesion at L2 with pathologic fracture. Additional involvement of L1-L3 as seen on comparison MRI. Soft tissue extension in the retroperitoneum is slightly decreased in thickness from comparison MRI and CT. No evidence of new metastatic skeletal disease. 3. Stable sclerotic lesion in the RIGHT sacral ala. 4. LEFT adrenal gland normal size. Electronically Signed   By: Suzy Bouchard M.D.   On: 02/19/2019 10:25   Ct Abdomen Pelvis W Contrast  Result Date: 02/19/2019 CLINICAL DATA:  RIGHT lung cancer diagnosed 2019 with bone metastasis. Chemotherapy in progress. EXAM: CT CHEST, ABDOMEN, AND PELVIS WITH CONTRAST TECHNIQUE: Multidetector CT imaging of the chest, abdomen and pelvis was performed following the standard protocol during bolus administration of intravenous contrast. CONTRAST:  156mL OMNIPAQUE IOHEXOL 300 MG/ML  SOLN COMPARISON:  MRI 01/01/2019, CT 12/18/2018, PET-CT 03/05/2018 FINDINGS: CT CHEST FINDINGS Cardiovascular: Port in the anterior chest wall with tip in distal SVC. No significant vascular findings. Normal heart size. No pericardial effusion. Mediastinum/Nodes: No axillary or supraclavicular adenopathy. No mediastinal hilar adenopathy. No pericardial effusion. Lungs/Pleura: Post radiation change in the RIGHT upper lobe. Band of consolidation with air bronchograms extending from the RIGHT hilum posterolaterally. Within the periphery of the consolidated pattern in 26 mm x 17 mm ovoid intermediate density lesion decreased from 32 x 19 mm on prior. Small RIGHT effusion is  increased Musculoskeletal: No aggressive osseous lesion. CT ABDOMEN AND PELVIS FINDINGS Hepatobiliary: No focal hepatic lesion. No biliary ductal dilatation. Gallbladder is normal. Common bile duct is normal. Pancreas: Pancreas is normal. No ductal dilatation. No pancreatic inflammation. Spleen: Normal spleen Adrenals/urinary tract: Adrenal glands and kidneys are normal. The ureters and bladder normal. Stomach/Bowel: Stomach, small bowel, appendix, and cecum are normal. Large volume stool in the ascending colon. Transverse colon descending colon normal. Vascular/Lymphatic: Abdominal aorta normal caliber. There is a rim of low-attenuation tissue anterior to the L2 vertebral body and posterior the aorta measuring 12 mm in depth. This compares to 18 mm in depth on CT. This collection related to a metastatic lesion at L2 described on comparison MRI. No retroperitoneal adenopathy.  No iliac adenopathy Reproductive: Uterus and necks are normal. Other: No free fluid. Musculoskeletal: Again demonstrated metastatic lesion at L2 with associated compression fracture not changed from comparison MRI. Evidence of involvement of the L1 and L3 vertebral bodies as seen on comparison MRI. Soft tissue anterior to the metastatic lesions deep to the aorta is decreased in depth measuring 13 mm in axial dimension compared  18 on comparison. Stable sclerotic lesion the RIGHT sacral ala measures 19 mm. IMPRESSION: Chest Impression: 1. Post radiation change in the RIGHT upper lobe without evidence local recurrence. Rounded portion within the periphery of the post treatment consolidation is decreased in size. 2. No evidence of disease recurrence or progression in thorax. 3. New small RIGHT pleural effusion. Abdomen / Pelvis Impression: 1. No evidence of disease progression the abdomen pelvis. 2. Stable metastatic lesion at L2 with pathologic fracture. Additional involvement of L1-L3 as seen on comparison MRI. Soft tissue extension in the  retroperitoneum is slightly decreased in thickness from comparison MRI and CT. No evidence of new metastatic skeletal disease. 3. Stable sclerotic lesion in the RIGHT sacral ala. 4. LEFT adrenal gland normal size. Electronically Signed   By: Suzy Bouchard M.D.   On: 02/19/2019 10:25    ASSESSMENT AND PLAN: This is a very pleasant 63 years old white female with metastatic poorly differentiated malignant neoplasm of unknown primary at this point but according to the pathologist at the cancer center it is likely poorly differentiated non-small cell carcinoma of lung primary. 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 PDL 1 expression was 99%. The molecular studies by foundation 1 as well as [employer] showed no actionable mutations. The patient was a started on treatment with systemic chemotherapy with carboplatin for AUC of 5, Alimta 500 mg/M2 and Keytruda 200 mg IV every 3 weeks.  She is status post 4 cycles with partial response.  The patient was started on maintenance treatment with Alimta and Keytruda status post 12 cycles. She tolerated the last cycle of her treatment well with no concerning adverse effects. I recommended for her to proceed with cycle #13 of her maintenance treatment today. For the back pain, we will repeat MRI of the lumbar spine again to rule out any new metastatic lesion especially with the new pain.   For pain management she will continue continue on tramadol on as-needed basis.   For the anemia, we will continue to monitor her hemoglobin and hematocrit closely and consider the patient for transfusion if her hemoglobin is less than 8.0 g/dl The patient voices understanding of current disease status and treatment options and is in agreement with the current care plan. All questions were answered. The patient knows to call the clinic with any problems, questions or concerns. We can certainly see the patient much sooner if necessary.   Disclaimer: This note was dictated with voice recognition software. Similar sounding words can inadvertently be transcribed and may not be corrected upon review.

## 2019-03-20 ENCOUNTER — Other Ambulatory Visit: Payer: Self-pay | Admitting: Internal Medicine

## 2019-03-20 ENCOUNTER — Telehealth: Payer: Self-pay | Admitting: Medical Oncology

## 2019-03-20 DIAGNOSIS — R112 Nausea with vomiting, unspecified: Secondary | ICD-10-CM

## 2019-03-20 MED ORDER — FENTANYL 25 MCG/HR TD PT72: 1.0000 | MEDICATED_PATCH | TRANSDERMAL | 0 refills | Status: DC

## 2019-03-20 MED FILL — ONDANSETRON HCL 8 MG TABLET: 8 | 7 days supply | Qty: 20 | Fill #0

## 2019-03-20 NOTE — Telephone Encounter (Signed)
Pain increased in in L hip and L back with numbness , Tramadol not effective .   Requests refill Fentanyl   MRI on thursday

## 2019-03-20 NOTE — Telephone Encounter (Signed)
I am concerned about the way she keeps changing her pain medicine on her own.  She needs to stick with our recommendation regarding her pain management and we can always discuss changes during the visit.  I will refill at this time but I will not accept any changes in her pain medication in the future without consultation with me.

## 2019-03-21 ENCOUNTER — Other Ambulatory Visit: Payer: Self-pay

## 2019-03-21 ENCOUNTER — Other Ambulatory Visit: Payer: Self-pay | Admitting: Internal Medicine

## 2019-03-21 ENCOUNTER — Other Ambulatory Visit: Payer: Self-pay | Admitting: Medical Oncology

## 2019-03-21 ENCOUNTER — Ambulatory Visit (HOSPITAL_COMMUNITY)
Admission: RE | Admit: 2019-03-21 | Discharge: 2019-03-21 | Disposition: A | Discharge status: Home / Self Care | Payer: 59 | Source: Ambulatory Visit | Attending: Internal Medicine | Admitting: Internal Medicine

## 2019-03-21 DIAGNOSIS — C3490 Malignant neoplasm of unspecified part of unspecified bronchus or lung: Secondary | ICD-10-CM | POA: Insufficient documentation

## 2019-03-21 DIAGNOSIS — M5135 Other intervertebral disc degeneration, thoracolumbar region: Secondary | ICD-10-CM | POA: Diagnosis not present

## 2019-03-21 MED ORDER — FENTANYL 50 MCG/HR TD PT72: 1.0000 | MEDICATED_PATCH | TRANSDERMAL | 0 refills | Status: DC

## 2019-03-21 MED ORDER — GADOBUTROL 1 MMOL/ML IV SOLN
5.0000 mL | Freq: Once | INTRAVENOUS | Status: AC | PRN
  Administered 2019-03-21: 5 mL via INTRAVENOUS

## 2019-03-21 NOTE — Telephone Encounter (Signed)
Pt notified and she understands the concern with the pain medication.I told her if she has pain control issues she needs to schedule an appt to be seen before changing meds.

## 2019-03-22 ENCOUNTER — Telehealth: Payer: Self-pay | Admitting: *Deleted

## 2019-03-22 ENCOUNTER — Other Ambulatory Visit: Payer: Self-pay | Admitting: *Deleted

## 2019-03-22 MED ORDER — DEXAMETHASONE 4 MG PO TABS: 4.0000 mg | ORAL_TABLET | Freq: Two times a day (BID) | ORAL | 0 refills | Status: DC

## 2019-03-22 MED FILL — DEXAMETHASONE 4 MG TABLET: 4 | 15 days supply | Qty: 30 | Fill #0

## 2019-03-22 MED FILL — fentaNYL 50 MCG/HR PT72: 50 | 15 days supply | Qty: 5 | Fill #0

## 2019-03-22 NOTE — Telephone Encounter (Signed)
Received vm message from patient-left @ 8:13 am today regarding her back pain.  Pt states she had an MRI of her spine  Yesterday. She is asking if Dr. Julien Nordmann has reviewed the scan and what is the plan for her pain control. Dr. Julien Nordmann has reviewed the scan and has sent a referral to Dr. Tammi Klippel in [place] as pt has increased tumor growth in lumbar spine areas. Dr. Lindi Adie refilled her Fentanyl 50 mcg patch and ordered Decadron 4 mg BID to [facility]. Pt advised of the above. She voiced understanding and she informed to expect a call from [place] scheduling.

## 2019-03-25 ENCOUNTER — Telehealth: Payer: Self-pay | Admitting: *Deleted

## 2019-03-25 ENCOUNTER — Telehealth: Payer: Self-pay | Admitting: Internal Medicine

## 2019-03-25 NOTE — Telephone Encounter (Signed)
I can see her Wednesday, March 27, 2019 at 1:30 PM.

## 2019-03-25 NOTE — Telephone Encounter (Signed)
Pt called states " The fentanyl patch 82mcg and steroids I was given last week are not helping that much. Can I set up an appt for pain management?" Pt is currently waiting on [facility] to call her regarding an appt.  Next F/U 7/23. Message forward to MD for review.

## 2019-03-25 NOTE — Telephone Encounter (Signed)
Confirmed 7/15 f/u with patient.

## 2019-03-27 ENCOUNTER — Encounter: Payer: Self-pay | Admitting: Internal Medicine

## 2019-03-27 ENCOUNTER — Other Ambulatory Visit: Payer: Self-pay

## 2019-03-27 ENCOUNTER — Inpatient Hospital Stay (HOSPITAL_BASED_OUTPATIENT_CLINIC_OR_DEPARTMENT_OTHER): Payer: 59 | Admitting: Internal Medicine

## 2019-03-27 ENCOUNTER — Other Ambulatory Visit: Payer: Self-pay | Admitting: Internal Medicine

## 2019-03-27 VITALS — BP 151/89 | HR 126 | Temp 98.7°F | Resp 20 | Ht 67.0 in | Wt 111.3 lb

## 2019-03-27 DIAGNOSIS — Z510 Encounter for antineoplastic radiation therapy: Secondary | ICD-10-CM | POA: Diagnosis not present

## 2019-03-27 DIAGNOSIS — G893 Neoplasm related pain (acute) (chronic): Secondary | ICD-10-CM

## 2019-03-27 DIAGNOSIS — M549 Dorsalgia, unspecified: Secondary | ICD-10-CM | POA: Diagnosis not present

## 2019-03-27 DIAGNOSIS — Z79899 Other long term (current) drug therapy: Secondary | ICD-10-CM | POA: Diagnosis not present

## 2019-03-27 DIAGNOSIS — C7972 Secondary malignant neoplasm of left adrenal gland: Secondary | ICD-10-CM | POA: Diagnosis not present

## 2019-03-27 DIAGNOSIS — D649 Anemia, unspecified: Secondary | ICD-10-CM | POA: Diagnosis not present

## 2019-03-27 DIAGNOSIS — Z5111 Encounter for antineoplastic chemotherapy: Secondary | ICD-10-CM | POA: Diagnosis not present

## 2019-03-27 DIAGNOSIS — C3411 Malignant neoplasm of upper lobe, right bronchus or lung: Secondary | ICD-10-CM | POA: Diagnosis not present

## 2019-03-27 DIAGNOSIS — C799 Secondary malignant neoplasm of unspecified site: Secondary | ICD-10-CM

## 2019-03-27 DIAGNOSIS — C801 Malignant (primary) neoplasm, unspecified: Secondary | ICD-10-CM | POA: Diagnosis not present

## 2019-03-27 DIAGNOSIS — C7951 Secondary malignant neoplasm of bone: Secondary | ICD-10-CM | POA: Diagnosis not present

## 2019-03-27 DIAGNOSIS — C7949 Secondary malignant neoplasm of other parts of nervous system: Secondary | ICD-10-CM

## 2019-03-27 DIAGNOSIS — M545 Low back pain: Secondary | ICD-10-CM | POA: Diagnosis not present

## 2019-03-27 MED ORDER — GABAPENTIN 100 MG PO CAPS: 100.0000 mg | ORAL_CAPSULE | Freq: Three times a day (TID) | ORAL | 2 refills | Status: DC

## 2019-03-27 MED ORDER — OXYCODONE-ACETAMINOPHEN 5-325 MG PO TABS: 1.0000 | ORAL_TABLET | Freq: Four times a day (QID) | ORAL | 0 refills | Status: DC | PRN

## 2019-03-27 MED ORDER — DEXAMETHASONE 4 MG PO TABS: 4.0000 mg | ORAL_TABLET | Freq: Four times a day (QID) | ORAL | 0 refills | Status: DC

## 2019-03-27 MED ORDER — FENTANYL 25 MCG/HR TD PT72: 1.0000 | MEDICATED_PATCH | TRANSDERMAL | 0 refills | Status: DC

## 2019-03-27 MED FILL — GABAPENTIN 100 MG CAPSULE: 100 | 30 days supply | Qty: 90 | Fill #0

## 2019-03-27 MED FILL — OXYCODONE-ACETAMINOPHEN 5-3: 5-325 | 15 days supply | Qty: 60 | Fill #0

## 2019-03-27 NOTE — Progress Notes (Signed)
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  Metastatic sarcoma cannot be also excluded at this point. Molecular studies by foundation 1 showed no actionable mutations. PDL 1 expression 99%  PRIOR THERAPY: 1) Palliative radiotherapy to the neck mass and hip lesion under the care of Dr. Tammi Klippel  2) palliative radiotherapy to the left adrenal gland and periaortic lymph node under the care of Dr. Tammi Klippel.  CURRENT THERAPY: Systemic chemotherapy with carboplatin for AUC of 5, Alimta 500 mg/M2 and Keytruda 200 mg IV every 3 weeks.  First dose was April 10, 2018.  Status post 17 cycles.  Starting from cycle #5 the patient is on maintenance treatment with Alimta and Keytruda.  INTERVAL HISTORY: Rebecca Lucas 63 y.o. female returns to the clinic today for follow-up visit.  The patient is feeling weak and tired today.  She continues to have low back pain with radiation to the left leg.  She is currently on fentanyl patch 50 mcg/hour every 3 days in addition to Percocet 5/325 every 6 hours.  She also take some Tylenol in between.  She denied having any weakness in the lower extremities and no incontinence to urine or bowel movement.  She was also started on Decadron 4 mg p.o. twice daily.  She had MRI of the lumbar spine performed recently that showed evidence for disease progression with bulky spinal and paraspinal tumor from L1-L3 measuring 7.8 x 6.2 x 6.7 cm.  There was subtotal involvement of the L2 vertebrae that has increased and circumferential epidural tumor that has substantially increased and results in moderate to severe malignant spinal and  severe left lateral recess stenosis.  The epidural tumor completely infiltrates the left L1 and L2 neural foramina with moderate involvement of the right L2 foramina.  There was also partial erosion of the anterior endplate of L1 and L3 since April 2020.  She has a stable pathologic compression fracture of L2 with mild retropulsion and visible right sacral and left iliac bone metastasis that appears stable.  Her case was discussed at the neuro-oncology tumor board 2 days ago and recommendation was referral for neurosurgery evaluation and surgical intervention.  She has an appointment with Dr. Vertell Limber on April 10, 2019.  The patient denied having any other concerning complaints except for few pounds of weight loss.  She denied having any chest pain, shortness of breath, cough or hemoptysis.  She has no nausea, vomiting, diarrhea or constipation.  She denied having any headache or visual changes.  She is here today for evaluation and discussion of her MRI results and recommendation regarding her pain management.  MEDICAL HISTORY:No past medical history on file.  ALLERGIES:  has No Known Allergies.  MEDICATIONS:  Current Outpatient Medications  Medication Sig Dispense Refill   acetaminophen (TYLENOL) 500 MG tablet Take 1,000 mg by mouth every 6 (six) hours as needed for mild pain.      alendronate (FOSAMAX) 70 MG tablet Take 70 mg by mouth once a week. Wednesday  3   butalbital-aspirin-caffeine (FIORINAL) 50-325-40 MG capsule Take 1 capsule by mouth every 6 (six) hours as needed for migraine.   1   Calcium Carbonate-Vitamin D (CALCIUM-D PO)  Take 1 tablet by mouth 2 (two) times daily.     dexamethasone (DECADRON) 4 MG tablet Take 1 tablet (4 mg total) by mouth 2 (two) times daily with a meal. 30 tablet 0   fentaNYL (DURAGESIC) 50 MCG/HR Place 1 patch onto the skin every 3 (three) days. 5 patch 0   folic acid (FOLVITE) 1 MG tablet Take 1 tablet (1 mg total) by mouth daily. 90 tablet 1   ibuprofen  (ADVIL,MOTRIN) 200 MG tablet Take 600 mg by mouth every 6 (six) hours as needed for mild pain.      lidocaine-prilocaine (EMLA) cream Apply 1 application topically as needed. 30 g 0   LINZESS 72 MCG capsule TAKE 1 CAPSULE (72 MCG TOTAL) BY MOUTH DAILY BEFORE BREAKFAST. 30 capsule 0   LUTEIN PO Take 1 tablet by mouth daily.     omeprazole (PRILOSEC) 20 MG capsule Take 1 capsule (20 mg total) by mouth daily. 90 capsule 1   ondansetron (ZOFRAN) 8 MG tablet TAKE 1 TABLET (8 MG TOTAL) BY MOUTH EVERY 8 HOURS AS NEEDED FOR NAUSEA OR VOMITING. 20 tablet 0   traMADol (ULTRAM) 50 MG tablet Take 1 tablet (50 mg total) by mouth every 6 (six) hours as needed for moderate pain. 120 tablet 0   zolpidem (AMBIEN) 10 MG tablet Take 1 tablet (10 mg total) by mouth at bedtime as needed for sleep. 30 tablet 0   No current facility-administered medications for this visit.     SURGICAL HISTORY:  Past Surgical History:  Procedure Laterality Date   IR IMAGING GUIDED PORT INSERTION  06/26/2018    REVIEW OF SYSTEMS:  Constitutional: positive for fatigue and weight loss Eyes: negative Ears, nose, mouth, throat, and face: negative Respiratory: negative Cardiovascular: negative Gastrointestinal: negative Genitourinary:negative Integument/breast: negative Hematologic/lymphatic: negative Musculoskeletal:positive for back pain and bone pain Neurological: positive for paresthesia Behavioral/Psych: negative Endocrine: negative Allergic/Immunologic: negative   PHYSICAL EXAMINATION: General appearance: alert, cooperative, fatigued and no distress Head: Normocephalic, without obvious abnormality, atraumatic Neck: no adenopathy, no JVD, supple, symmetrical, trachea midline and thyroid not enlarged, symmetric, no tenderness/mass/nodules Lymph nodes: Cervical, supraclavicular, and axillary nodes normal. Resp: clear to auscultation bilaterally Back: symmetric, no curvature. ROM normal. No CVA  tenderness. Cardio: regular rate and rhythm, S1, S2 normal, no murmur, click, rub or gallop GI: soft, non-tender; bowel sounds normal; no masses,  no organomegaly Extremities: extremities normal, atraumatic, no cyanosis or edema Neurologic: Alert and oriented X 3, normal strength and tone. Normal symmetric reflexes. Normal coordination and gait  ECOG PERFORMANCE STATUS: 1 - Symptomatic but completely ambulatory  Blood pressure (!) 151/89, pulse (!) 126, temperature 98.7 F (37.1 C), temperature source Oral, resp. rate 20, height 5\' 7"  (1.702 m), weight 111 lb 4.8 oz (50.5 kg), SpO2 100 %.  LABORATORY DATA: Lab Results  Component Value Date   WBC 20.1 (H) 03/14/2019   HGB 8.2 (L) 03/14/2019   HCT 27.6 (L) 03/14/2019   MCV 98.6 03/14/2019   PLT 634 (H) 03/14/2019      Chemistry      Component Value Date/Time   NA 134 (L) 03/14/2019 1103   K 4.6 03/14/2019 1103   CL 98 03/14/2019 1103   CO2 24 03/14/2019 1103   BUN 12 03/14/2019 1103   CREATININE 0.74 03/14/2019 1103      Component Value Date/Time   CALCIUM 9.2 03/14/2019 1103   ALKPHOS 534 (H) 03/14/2019 1103   AST 56 (H) 03/14/2019 1103   ALT 50 (H) 03/14/2019  50   BILITOT <0.2 (L) 03/14/2019 1103       RADIOGRAPHIC STUDIES: Mr Lumbar Spine W Wo Contrast  Result Date: 03/21/2019 CLINICAL DATA:  63 year old female with metastatic lung cancer. Pathologic compression of L2 with some epidural tumor. Increasing back pain. EXAM: MRI LUMBAR SPINE WITHOUT AND WITH CONTRAST TECHNIQUE: Multiplanar and multiecho pulse sequences of the lumbar spine were obtained without and with intravenous contrast. CONTRAST:  5 milliliters Gadavist COMPARISON:  Lumbar MRI 01/01/2019 and earlier. FINDINGS: Segmentation: Normal as seen on June CTs. Same numbering system as on 01/01/2019. Alignment: Stable L4 and L5 vertebral height and alignment. Stable visible T11 and T12 vertebral height and alignment. Straightening of lumbar lordosis from L1-L3,  with mild retrolisthesis of L2, has not significantly changed. Vertebrae: Some interval erosion of the L1 anterior inferior endplate. Stable severe anterior compression of L2. Some erosion of the L3 anterior superior endplate. These areas demonstrate heterogeneous enhancement and anterior extraosseous extension of tumor. Additional L1-L2 and L2-L3 level epidural tumor is described below. Posterior extension of tumor in the L2 pedicles, and new complete involvement of the left L2 posterior elements (series 6, image 11) since April. New subtotal involvement of the L2 spinous process. No L4 or L5 metastasis.  No T12 metastasis identified. Confluent right sacral ala and left posterior iliac metastases are partially visible and not significantly changed. SI joints remain intact. Conus medullaris and cauda equina: Conus extends to the L1 level. No lower spinal cord or conus signal abnormality. Capacious underlying spinal canal. No abnormal intradural enhancement. Paraspinal and other soft tissues: Abnormal paraspinal soft tissues centered at L2, further detailed below. Visible abdominal viscera remain within normal limits. Posterior paraspinal soft tissues are normal except for abnormal erector spinae muscle STIR signal which is probably denervation related. Disc levels: T12-L1:  Small left paracentral disc protrusion is stable. L1-L2 and L2-L3: Circumferential epidural tumor greater on the left (series 9, image 12). All told, vertebral and nearly circumferential extraosseous extension of tumor here encompasses 78 by 62 by 67 millimeters (AP by transverse by CC). There is new moderate to severe malignant spinal and left lateral recess stenosis (series 9, image 12). There is complete tumor infiltration of the left L1 and L2 neural foramina. There is moderate infiltration of the right L2 neural foramina. The right L1 foramina appear spared at this time. L3-L4:  Negative. L4-L5:  Negative. L5-S1:  Negative. IMPRESSION: 1.  Progressed and bulky spinal and paraspinal tumor now from L1 through L3. - this encompasses 78 x 62 x 67 mm. - subtotal involvement of the L2 vertebra has increased. - circumferential epidural tumor has substantially increased and results in moderate to severe malignant spinal and severe left lateral recess stenosis. See series 9, image 12. - epidural tumor completely infiltrates the left L1 and L2 neural foramina, with moderate involvement of the right L2 foramina. - partial erosion of the anterior endplates of L1 and L3 since April. 2. Stable pathologic compression fracture of L2 with mild retropulsion. 3. Visible right sacral and left iliac bone metastases appear stable. 4. No new lumbar metastasis identified. Electronically Signed   By: Genevie Ann M.D.   On: 03/21/2019 10:04    ASSESSMENT AND PLAN: This is a very pleasant 63 years old white female with metastatic poorly differentiated malignant neoplasm of unknown primary at this point but according to the pathologist at the cancer center it is likely poorly differentiated non-small cell carcinoma of lung primary. The cancer type DX was reported 90%  sarcoma, 5% lung and less than 5% melanoma. PDL 1 expression was 99%. The molecular studies by foundation 1 as well as [employer] showed no actionable mutations. The patient was a started on treatment with systemic chemotherapy with carboplatin for AUC of 5, Alimta 500 mg/M2 and Keytruda 200 mg IV every 3 weeks.  She is status post 4 cycles with partial response.  The patient was started on maintenance treatment with Alimta and Keytruda status post 13 cycles. The patient has been tolerating her treatment well except for fatigue. I recommended for her to hold any further treatment with Alimta and Keytruda for now until we complete the management of the metastatic disease in the lumbar spine. For the back pain, MRI of the lumbar spine showed evidence for disease progression with epidural tumor extension.  The  patient is scheduled to see neurosurgery but unfortunately her appointment is 2 weeks from now.  I discussed with Dr. Tammi Klippel her condition and we will try to get her seen sooner for consideration of surgical intervention to this lesion. For pain management, I will increase her dose of fentanyl patch to 75 mcg/hour every 3 days in addition to Percocet 5/325 mg p.o every 6 hours as needed.  I will also start the patient on gabapentin because of neuropathic pain 100 mg p.o. 3 times daily and may consider increasing the dose if needed.  I will also increase her Decadron to 4 mg p.o. every 6 hours to decrease the swelling around the progressive tumor in the lumbar spine. For the anemia, we will continue to monitor her hemoglobin and hematocrit closely and consider the patient for transfusion if her hemoglobin is less than 8.0 g/dl I will see her back for follow-up visit next week for reevaluation and modification of her pain regimen if needed. She was advised to go immediately to the emergency department if she has any weakness of the lower extremities or incontinence to urine or bowel movement. For the bone disease, I will start the patient on Xgeva 120 mg every 4 weeks.  She has a recent dental evaluation and no issues were reported.  She was advised about the risk of osteonecrosis of the jaw.  She was also advised to increase her calcium intake. The patient voices understanding of current disease status and treatment options and is in agreement with the current care plan. All questions were answered. The patient knows to call the clinic with any problems, questions or concerns. We can certainly see the patient much sooner if necessary.  Disclaimer: This note was dictated with voice recognition software. Similar sounding words can inadvertently be transcribed and may not be corrected upon review.

## 2019-03-28 MED FILL — DEXAMETHASONE 4 MG TABLET: 4 | 15 days supply | Qty: 60 | Fill #0

## 2019-03-29 ENCOUNTER — Telehealth: Payer: Self-pay

## 2019-03-29 ENCOUNTER — Telehealth: Payer: Self-pay | Admitting: Medical Oncology

## 2019-03-29 ENCOUNTER — Telehealth: Payer: Self-pay | Admitting: Internal Medicine

## 2019-03-29 DIAGNOSIS — C3490 Malignant neoplasm of unspecified part of unspecified bronchus or lung: Secondary | ICD-10-CM | POA: Diagnosis not present

## 2019-03-29 NOTE — Telephone Encounter (Signed)
Scheduled appt per 7/15 los - pt aware

## 2019-03-29 NOTE — Telephone Encounter (Signed)
Still waiting for PA for increased Fentanyl dose.Message sent to [facility].

## 2019-03-29 NOTE — Telephone Encounter (Signed)
Called patient to inform that PA for Fentanyl patch has been sent to insurance plan.  No answer yet.  Patient voiced understanding.

## 2019-04-01 ENCOUNTER — Other Ambulatory Visit: Payer: Self-pay | Admitting: Internal Medicine

## 2019-04-01 ENCOUNTER — Telehealth: Payer: Self-pay | Admitting: Medical Oncology

## 2019-04-01 ENCOUNTER — Other Ambulatory Visit: Payer: Self-pay | Admitting: Medical Oncology

## 2019-04-01 MED ORDER — FENTANYL 75 MCG/HR TD PT72: 1.0000 | MEDICATED_PATCH | TRANSDERMAL | 0 refills | Status: DC

## 2019-04-01 MED ORDER — GABAPENTIN 300 MG PO CAPS: 300.0000 mg | ORAL_CAPSULE | Freq: Three times a day (TID) | ORAL | 0 refills | Status: DC

## 2019-04-01 MED FILL — FENTANYL 25 MCG/HR PT72: 25 | 15 days supply | Qty: 5 | Fill #0

## 2019-04-01 NOTE — Telephone Encounter (Signed)
Pain management-  Needs rx for fentanyl 75 mcg patch.  (She  has one Fentanyl  50 mg patch left and zero 25 mcg patches).  Gabapentin- Can it be increased?. If so she needs new rx.  FMLA -husband f/u if we have [employer] request.Message to [employer].

## 2019-04-03 ENCOUNTER — Other Ambulatory Visit: Payer: Self-pay | Admitting: Internal Medicine

## 2019-04-03 MED FILL — LINZESS 72 MCG CAPSULE: 72 | 30 days supply | Qty: 30 | Fill #0

## 2019-04-04 ENCOUNTER — Ambulatory Visit: Payer: 59

## 2019-04-04 ENCOUNTER — Telehealth: Payer: Self-pay | Admitting: Medical Oncology

## 2019-04-04 ENCOUNTER — Inpatient Hospital Stay: Payer: 59

## 2019-04-04 ENCOUNTER — Inpatient Hospital Stay (HOSPITAL_BASED_OUTPATIENT_CLINIC_OR_DEPARTMENT_OTHER): Payer: 59 | Admitting: Internal Medicine

## 2019-04-04 ENCOUNTER — Other Ambulatory Visit: Payer: Self-pay

## 2019-04-04 ENCOUNTER — Telehealth: Payer: Self-pay | Admitting: Urology

## 2019-04-04 ENCOUNTER — Other Ambulatory Visit: Payer: Self-pay | Admitting: Physician Assistant

## 2019-04-04 ENCOUNTER — Other Ambulatory Visit: Payer: Self-pay | Admitting: Internal Medicine

## 2019-04-04 ENCOUNTER — Telehealth: Payer: Self-pay | Admitting: Internal Medicine

## 2019-04-04 ENCOUNTER — Encounter: Payer: Self-pay | Admitting: Internal Medicine

## 2019-04-04 VITALS — BP 113/72 | HR 113 | Temp 98.7°F | Resp 18 | Ht 67.0 in

## 2019-04-04 DIAGNOSIS — C799 Secondary malignant neoplasm of unspecified site: Secondary | ICD-10-CM

## 2019-04-04 DIAGNOSIS — Z5112 Encounter for antineoplastic immunotherapy: Secondary | ICD-10-CM

## 2019-04-04 DIAGNOSIS — C801 Malignant (primary) neoplasm, unspecified: Secondary | ICD-10-CM

## 2019-04-04 DIAGNOSIS — G893 Neoplasm related pain (acute) (chronic): Secondary | ICD-10-CM | POA: Diagnosis not present

## 2019-04-04 DIAGNOSIS — C7949 Secondary malignant neoplasm of other parts of nervous system: Secondary | ICD-10-CM

## 2019-04-04 DIAGNOSIS — Z5111 Encounter for antineoplastic chemotherapy: Secondary | ICD-10-CM | POA: Diagnosis not present

## 2019-04-04 DIAGNOSIS — C3411 Malignant neoplasm of upper lobe, right bronchus or lung: Secondary | ICD-10-CM

## 2019-04-04 DIAGNOSIS — D649 Anemia, unspecified: Secondary | ICD-10-CM | POA: Diagnosis not present

## 2019-04-04 DIAGNOSIS — Z510 Encounter for antineoplastic radiation therapy: Secondary | ICD-10-CM | POA: Diagnosis not present

## 2019-04-04 DIAGNOSIS — C7951 Secondary malignant neoplasm of bone: Secondary | ICD-10-CM | POA: Diagnosis not present

## 2019-04-04 DIAGNOSIS — C7972 Secondary malignant neoplasm of left adrenal gland: Secondary | ICD-10-CM | POA: Diagnosis not present

## 2019-04-04 DIAGNOSIS — M545 Low back pain: Secondary | ICD-10-CM | POA: Diagnosis not present

## 2019-04-04 LAB — CBC WITH DIFFERENTIAL (CANCER CENTER ONLY)
Abs Immature Granulocytes: 2.51 10*3/uL — ABNORMAL HIGH (ref 0.00–0.07)
Basophils Absolute: 0.2 10*3/uL — ABNORMAL HIGH (ref 0.0–0.1)
Basophils Relative: 0 %
Eosinophils Absolute: 0 10*3/uL (ref 0.0–0.5)
Eosinophils Relative: 0 %
HCT: 36.5 % (ref 36.0–46.0)
Hemoglobin: 11.3 g/dL — ABNORMAL LOW (ref 12.0–15.0)
Immature Granulocytes: 5 %
Lymphocytes Relative: 1 %
Lymphs Abs: 0.4 10*3/uL — ABNORMAL LOW (ref 0.7–4.0)
MCH: 30.1 pg (ref 26.0–34.0)
MCHC: 31 g/dL (ref 30.0–36.0)
MCV: 97.1 fL (ref 80.0–100.0)
Monocytes Absolute: 2.5 10*3/uL — ABNORMAL HIGH (ref 0.1–1.0)
Monocytes Relative: 5 %
Neutro Abs: 44.3 10*3/uL — ABNORMAL HIGH (ref 1.7–7.7)
Neutrophils Relative %: 89 %
Platelet Count: 562 10*3/uL — ABNORMAL HIGH (ref 150–400)
RBC: 3.76 MIL/uL — ABNORMAL LOW (ref 3.87–5.11)
RDW: 18.9 % — ABNORMAL HIGH (ref 11.5–15.5)
WBC Count: 49.8 10*3/uL — ABNORMAL HIGH (ref 4.0–10.5)
nRBC: 0 % (ref 0.0–0.2)

## 2019-04-04 LAB — CMP (CANCER CENTER ONLY)
ALT: 505 U/L (ref 0–44)
AST: 166 U/L — ABNORMAL HIGH (ref 15–41)
Albumin: 3 g/dL — ABNORMAL LOW (ref 3.5–5.0)
Alkaline Phosphatase: 399 U/L — ABNORMAL HIGH (ref 38–126)
Anion gap: 12 (ref 5–15)
BUN: 26 mg/dL — ABNORMAL HIGH (ref 8–23)
CO2: 25 mmol/L (ref 22–32)
Calcium: 10 mg/dL (ref 8.9–10.3)
Chloride: 100 mmol/L (ref 98–111)
Creatinine: 0.76 mg/dL (ref 0.44–1.00)
GFR, Est AFR Am: >60 mL/min (ref >60)
GFR, Estimated: >60 mL/min (ref >60)
Glucose, Bld: 104 mg/dL — ABNORMAL HIGH (ref 70–99)
Potassium: 4.8 mmol/L (ref 3.5–5.1)
Sodium: 137 mmol/L (ref 135–145)
Total Bilirubin: 0.3 mg/dL (ref 0.3–1.2)
Total Protein: 7.9 g/dL (ref 6.5–8.1)

## 2019-04-04 LAB — TSH: TSH: 2.428 u[IU]/mL (ref 0.308–3.960)

## 2019-04-04 MED ORDER — SODIUM CHLORIDE 0.9% FLUSH
10.0000 mL | INTRAVENOUS | Status: DC | PRN
  Administered 2019-04-04: 10 mL
  Filled 2019-04-04: qty 10

## 2019-04-04 MED ORDER — HEPARIN SOD (PORK) LOCK FLUSH 100 UNIT/ML IV SOLN
500.0000 [IU] | Freq: Once | INTRAVENOUS | Status: AC | PRN
  Administered 2019-04-04: 11:00:00 500 [IU]
  Filled 2019-04-04: qty 5

## 2019-04-04 MED ORDER — DENOSUMAB 120 MG/1.7ML ~~LOC~~ SOLN
SUBCUTANEOUS | Status: AC
  Filled 2019-04-04: qty 1.7

## 2019-04-04 MED ORDER — DENOSUMAB 120 MG/1.7ML ~~LOC~~ SOLN
120.0000 mg | Freq: Once | SUBCUTANEOUS | Status: AC
  Administered 2019-04-04: 10:00:00 120 mg via SUBCUTANEOUS

## 2019-04-04 MED ORDER — OXYCODONE HCL 5 MG PO TABS: 5.0000 mg | ORAL_TABLET | Freq: Four times a day (QID) | ORAL | 0 refills | Status: DC | PRN

## 2019-04-04 MED FILL — GABAPENTIN 300 MG CAPSULE: 300 | 30 days supply | Qty: 90 | Fill #0

## 2019-04-04 MED FILL — oxyCODONE HCL 5 MG TABS: 5 | 15 days supply | Qty: 60 | Fill #0

## 2019-04-04 NOTE — Telephone Encounter (Signed)
Scheduled appt per 7/23 los - pt aware of next follow up appt . And referral placed in [facility]

## 2019-04-04 NOTE — Progress Notes (Signed)
ALT /AST results called to [facility].

## 2019-04-04 NOTE — Progress Notes (Signed)
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  Metastatic sarcoma cannot be also excluded at this point. Molecular studies by foundation 1 showed no actionable mutations. PDL 1 expression 99%  PRIOR THERAPY: 1) Palliative radiotherapy to the neck mass and hip lesion under the care of Dr. Tammi Klippel  2) palliative radiotherapy to the left adrenal gland and periaortic lymph node under the care of Dr. Tammi Klippel.  CURRENT THERAPY: Systemic chemotherapy with carboplatin for AUC of 5, Alimta 500 mg/M2 and Keytruda 200 mg IV every 3 weeks.  First dose was April 10, 2018.  Status post 17 cycles.  Starting from cycle #5 the patient is on maintenance treatment with Alimta and Keytruda.  INTERVAL HISTORY: Rebecca Lucas 63 y.o. female returns to the clinic today for follow-up visit.  The patient continues to complain of numbness and weakness in the lower extremities.  She had a recent fall earlier today.  She denied having any current chest pain, shortness of breath, cough or hemoptysis.  She denied having any fever or chills.  She eats good.  She is currently on pain medication with fentanyl patch 75 mcg/hour every 3 days in addition to oxycodone for breakthrough pain.  I also recently increased her dose of Neurontin to 300 mg p.o. 3 times daily but she has not started the new dose yet.  The patient is here today for reevaluation regarding her condition.  She was seen by neurosurgery last week but nursing scheduled for intervention yet.  MEDICAL HISTORY:No past medical history on file.  ALLERGIES:  has No Known Allergies.  MEDICATIONS:  Current Outpatient  Medications  Medication Sig Dispense Refill   acetaminophen (TYLENOL) 500 MG tablet Take 1,000 mg by mouth every 6 (six) hours as needed for mild pain.      alendronate (FOSAMAX) 70 MG tablet Take 70 mg by mouth once a week. Wednesday  3   butalbital-aspirin-caffeine (FIORINAL) 50-325-40 MG capsule Take 1 capsule by mouth every 6 (six) hours as needed for migraine.   1   Calcium Carbonate-Vitamin D (CALCIUM-D PO) Take 1 tablet by mouth 2 (two) times daily.     dexamethasone (DECADRON) 4 MG tablet Take 1 tablet (4 mg total) by mouth 4 (four) times daily. 60 tablet 0   fentaNYL (DURAGESIC) 75 MCG/HR Place 1 patch onto the skin every 3 (three) days for 15 days. 5 patch 0   folic acid (FOLVITE) 1 MG tablet Take 1 tablet (1 mg total) by mouth daily. 90 tablet 1   gabapentin (NEURONTIN) 300 MG capsule Take 1 capsule (300 mg total) by mouth 3 (three) times daily. 90 capsule 0   ibuprofen (ADVIL,MOTRIN) 200 MG tablet Take 600 mg by mouth every 6 (six) hours as needed for mild pain.      lidocaine-prilocaine (EMLA) cream Apply 1 application topically as needed. 30 g 0   LINZESS 72 MCG capsule TAKE 1 CAPSULE BY MOUTH ONCE DAILY BEFORE BREAKFAST 30 capsule 0   LUTEIN PO Take 1 tablet by mouth daily.     omeprazole (PRILOSEC) 20 MG capsule Take 1 capsule (20 mg total) by mouth daily. 90 capsule 1   ondansetron (ZOFRAN) 8 MG tablet TAKE  1 TABLET (8 MG TOTAL) BY MOUTH EVERY 8 HOURS AS NEEDED FOR NAUSEA OR VOMITING. 20 tablet 0   oxyCODONE-acetaminophen (PERCOCET/ROXICET) 5-325 MG tablet Take 1 tablet by mouth every 6 (six) hours as needed for severe pain. 60 tablet 0   traMADol (ULTRAM) 50 MG tablet Take 1 tablet (50 mg total) by mouth every 6 (six) hours as needed for moderate pain. 120 tablet 0   zolpidem (AMBIEN) 10 MG tablet Take 1 tablet (10 mg total) by mouth at bedtime as needed for sleep. 30 tablet 0   No current facility-administered medications for this visit.     SURGICAL  HISTORY:  Past Surgical History:  Procedure Laterality Date   IR IMAGING GUIDED PORT INSERTION  06/26/2018    REVIEW OF SYSTEMS:  Constitutional: positive for fatigue Eyes: negative Ears, nose, mouth, throat, and face: negative Respiratory: negative Cardiovascular: negative Gastrointestinal: negative Genitourinary:negative Integument/breast: negative Hematologic/lymphatic: negative Musculoskeletal:positive for back pain, bone pain and muscle weakness Neurological: positive for paresthesia and weakness Behavioral/Psych: negative Endocrine: negative Allergic/Immunologic: negative   PHYSICAL EXAMINATION: General appearance: alert, cooperative, fatigued and no distress Head: Normocephalic, without obvious abnormality, atraumatic Neck: no adenopathy, no JVD, supple, symmetrical, trachea midline and thyroid not enlarged, symmetric, no tenderness/mass/nodules Lymph nodes: Cervical, supraclavicular, and axillary nodes normal. Resp: clear to auscultation bilaterally Back: symmetric, no curvature. ROM normal. No CVA tenderness. Cardio: regular rate and rhythm, S1, S2 normal, no murmur, click, rub or gallop GI: soft, non-tender; bowel sounds normal; no masses,  no organomegaly Extremities: extremities normal, atraumatic, no cyanosis or edema Neurologic: Alert and oriented X 3, normal strength and tone. Normal symmetric reflexes. Normal coordination and gait  ECOG PERFORMANCE STATUS: 1 - Symptomatic but completely ambulatory  Blood pressure 113/72, pulse (!) 113, temperature 98.7 F (37.1 C), temperature source Temporal, resp. rate 18, height 5\' 7"  (1.702 m), SpO2 100 %.  LABORATORY DATA: Lab Results  Component Value Date   WBC 20.1 (H) 03/14/2019   HGB 8.2 (L) 03/14/2019   HCT 27.6 (L) 03/14/2019   MCV 98.6 03/14/2019   PLT 634 (H) 03/14/2019      Chemistry      Component Value Date/Time   NA 134 (L) 03/14/2019 1103   K 4.6 03/14/2019 1103   CL 98 03/14/2019 1103   CO2 24  03/14/2019 1103   BUN 12 03/14/2019 1103   CREATININE 0.74 03/14/2019 1103      Component Value Date/Time   CALCIUM 9.2 03/14/2019 1103   ALKPHOS 534 (H) 03/14/2019 1103   AST 56 (H) 03/14/2019 1103   ALT 50 (H) 03/14/2019 1103   BILITOT <0.2 (L) 03/14/2019 1103       RADIOGRAPHIC STUDIES: Mr Lumbar Spine W Wo Contrast  Result Date: 03/21/2019 CLINICAL DATA:  63 year old female with metastatic lung cancer. Pathologic compression of L2 with some epidural tumor. Increasing back pain. EXAM: MRI LUMBAR SPINE WITHOUT AND WITH CONTRAST TECHNIQUE: Multiplanar and multiecho pulse sequences of the lumbar spine were obtained without and with intravenous contrast. CONTRAST:  5 milliliters Gadavist COMPARISON:  Lumbar MRI 01/01/2019 and earlier. FINDINGS: Segmentation: Normal as seen on June CTs. Same numbering system as on 01/01/2019. Alignment: Stable L4 and L5 vertebral height and alignment. Stable visible T11 and T12 vertebral height and alignment. Straightening of lumbar lordosis from L1-L3, with mild retrolisthesis of L2, has not significantly changed. Vertebrae: Some interval erosion of the L1 anterior inferior endplate. Stable severe anterior compression of L2. Some erosion of the L3 anterior superior endplate. These  areas demonstrate heterogeneous enhancement and anterior extraosseous extension of tumor. Additional L1-L2 and L2-L3 level epidural tumor is described below. Posterior extension of tumor in the L2 pedicles, and new complete involvement of the left L2 posterior elements (series 6, image 11) since April. New subtotal involvement of the L2 spinous process. No L4 or L5 metastasis.  No T12 metastasis identified. Confluent right sacral ala and left posterior iliac metastases are partially visible and not significantly changed. SI joints remain intact. Conus medullaris and cauda equina: Conus extends to the L1 level. No lower spinal cord or conus signal abnormality. Capacious underlying spinal  canal. No abnormal intradural enhancement. Paraspinal and other soft tissues: Abnormal paraspinal soft tissues centered at L2, further detailed below. Visible abdominal viscera remain within normal limits. Posterior paraspinal soft tissues are normal except for abnormal erector spinae muscle STIR signal which is probably denervation related. Disc levels: T12-L1:  Small left paracentral disc protrusion is stable. L1-L2 and L2-L3: Circumferential epidural tumor greater on the left (series 9, image 12). All told, vertebral and nearly circumferential extraosseous extension of tumor here encompasses 78 by 62 by 67 millimeters (AP by transverse by CC). There is new moderate to severe malignant spinal and left lateral recess stenosis (series 9, image 12). There is complete tumor infiltration of the left L1 and L2 neural foramina. There is moderate infiltration of the right L2 neural foramina. The right L1 foramina appear spared at this time. L3-L4:  Negative. L4-L5:  Negative. L5-S1:  Negative. IMPRESSION: 1. Progressed and bulky spinal and paraspinal tumor now from L1 through L3. - this encompasses 78 x 62 x 67 mm. - subtotal involvement of the L2 vertebra has increased. - circumferential epidural tumor has substantially increased and results in moderate to severe malignant spinal and severe left lateral recess stenosis. See series 9, image 12. - epidural tumor completely infiltrates the left L1 and L2 neural foramina, with moderate involvement of the right L2 foramina. - partial erosion of the anterior endplates of L1 and L3 since April. 2. Stable pathologic compression fracture of L2 with mild retropulsion. 3. Visible right sacral and left iliac bone metastases appear stable. 4. No new lumbar metastasis identified. Electronically Signed   By: Genevie Ann M.D.   On: 03/21/2019 10:04    ASSESSMENT AND PLAN: This is a very pleasant 63 years old white female with metastatic poorly differentiated malignant neoplasm of unknown  primary at this point but according to the pathologist at the cancer center it is likely poorly differentiated non-small cell carcinoma of lung primary. 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 PDL 1 expression was 99%. The molecular studies by foundation 1 as well as [employer] showed no actionable mutations. The patient was a started on treatment with systemic chemotherapy with carboplatin for AUC of 5, Alimta 500 mg/M2 and Keytruda 200 mg IV every 3 weeks.  She is status post 4 cycles with partial response.  The patient was started on maintenance treatment with Alimta and Keytruda status post 13 cycles. The patient has been tolerating her treatment well except for fatigue. I recommended for her to hold any further treatment with Alimta and Keytruda for now until we complete the management of the metastatic disease in the lumbar spine. For the back pain, MRI of the lumbar spine showed evidence for disease progression with epidural tumor extension.  She was seen by neurosurgery last week.  I do not have any information about the  plan for surgical intervention yet.  We will reach out to neurosurgery as well as the neuro oncology navigator for details. For pain management, she is currently on fentanyl patch to 75 mcg/hour every 3 days in addition to Percocet 5/325 mg p.o every 6 hours as needed.  I also increase the gabapentin to 300 mg p.o. 3 times daily.  She will continue on Decadron to 4 mg p.o. every 6 hours to decrease the swelling around the progressive tumor in the lumbar spine.  I will also consider referring the patient to Dr. Lovenia Shuck for evaluation and pain management. For the anemia, we will repeat blood work today.  We will continue to monitor her hemoglobin and hematocrit closely and consider the patient for transfusion if her hemoglobin is less than 8.0 g/dl I will see her back for follow-up visit in 2 weeks for evaluation with repeat blood work. She  was advised to go immediately to the emergency department if she has any weakness of the lower extremities or incontinence to urine or bowel movement. For the bone disease, she will start the first dose of Xgeva 120 mg every 4 weeks today.  She has a recent dental evaluation and no issues were reported.  She was advised about the risk of osteonecrosis of the jaw.  She was also advised to increase her calcium intake. The patient voices understanding of current disease status and treatment options and is in agreement with the current care plan. All questions were answered. The patient knows to call the clinic with any problems, questions or concerns. We can certainly see the patient much sooner if necessary.  Disclaimer: This note was dictated with voice recognition software. Similar sounding words can inadvertently be transcribed and may not be corrected upon review.

## 2019-04-04 NOTE — Telephone Encounter (Signed)
Per Julien Nordmann I instructed pt to D/C tylenol and percocet. She needs new rx for oxycodone.

## 2019-04-04 NOTE — Telephone Encounter (Signed)
Cassie sent in rx for oxycodone. Still waiting on PA for Fentanyl. Requested to fax another PA.

## 2019-04-04 NOTE — Telephone Encounter (Signed)
Diagnosis:   63 y.o.woman with metastatic poorly differentiated malignant neoplasm of the right upper lung with disease progression atL1-L3.  Interval Since Last Radiation:  2 months   01/11/2019 - 01/25/2019//SBRT:40Gy in 52fractionsto the mid lumbar spine(L1-L3)and 30 Gy in 5 fractions to thebilateralsacroiliac metastases.  11/13/2018, 11/15/2018, 11/19/2018, 11/21/2018, 11/23/2018//SBRT:50Gy in48fractions at 10Gyper fraction to the tumor in the left adrenal gland and the left periaortic node  03/29/18 - 04/20/18: 1. 30 Gy directed to the RIGHT hip delivered in 10 fractions 2. 30 Gy directed to the RIGHT lung delivered in 10 fractions  03/21/2018, 03/23/2018, 03/26/2018, 03/28/2018, 03/30/2018//SBRT:The cervical spine (C2-3) was treated to 50 Gy in 5 fractions of 10 Gy.  Narrative:  I called and spoke with the patient regarding her ongoing low back pain and recent MRI results.  Her MRI scan from 03/21/2019 shows progressed and bulky spinal and paraspinal tumor with circumferential epidural tumor greater on the left and extraosseous extension of tumor encompassing 78 x 62 x 67 mm from L1 - L3.  There is new moderate to severe malignant spinal and left lateral recess stenosis.  These levels were recently treated with SBRT in 5 fractions in May 2020.    Given the short interval progression, the recommendation was for neurosurgery evaluation.  She was evaluated with Rebecca Lucas in neurosurgery last week on Friday 04/01/19, in Rebecca Lucas absence, to discuss the potential role of surgical intervention.  He felt that surgical intervention would be extensive and instead recommended additional palliative radiation if this was felt to be a safe alternative.  Rebecca Lucas has reviewed her MRI images and agrees that it would be safe to proceed with an additional 10 fractions of conventional radiotherapy directed to the progressive disease in L1-L3.    I have discussed these  recommendations with the patient and she is in agreement to proceed with the recommended radiotherapy.  She reports that her low back pain has been very poorly controlled despite the use of fentanyl 75 mcg patch, Percocet 5/325 every 4 to 6 hours as needed breakthrough pain and Decadron 4 mg p.o. 4 times daily.  She denies any bowel or bladder dysfunction but does report paresthesias in the left lower extremity primarily from her hip to her knee but occasionally extending to the left foot as well as intermittent weakness in the left leg contributing to 2 falls at home recently.  Given her neurologic symptoms and severity of disease on recent MRI, the recommendation is to proceed with CT Sim/treatment planning on Friday, 04/05/2019 with plans to initiate treatment later that same day.  We discussed the available radiation techniques, and focused on the details of logistics and delivery.  The plan is to proceed with an additional 10 fractions of conventional radiotherapy directed to the progressive disease in L1-L3. We reviewed the anticipated acute and late sequelae associated with radiation in this setting. The patient was encouraged to ask questions that were answered to her satisfaction.  She has given verbal consent to proceed today and we will obtain written consent at the time of her CT simulation on Friday, 04/05/2019.  She appears to have a good understanding of her overall disease and our recommendations and is comfortable and in agreement with the stated plan.  We will share this information with Rebecca Lucas and move forward with treatment planning accordingly.  Rebecca Lucas, MMS, PA-C [facility] [place] [facility] [place]: [phone]  Fax: [phone]

## 2019-04-04 NOTE — Progress Notes (Signed)
Per Dr. Julien Nordmann okay to use CMET from 7/2

## 2019-04-05 ENCOUNTER — Ambulatory Visit
Admission: RE | Admit: 2019-04-05 | Discharge: 2019-04-05 | Disposition: A | Discharge status: Home / Self Care | Payer: 59 | Source: Ambulatory Visit | Attending: Radiation Oncology | Admitting: Radiation Oncology

## 2019-04-05 ENCOUNTER — Other Ambulatory Visit: Payer: Self-pay

## 2019-04-05 DIAGNOSIS — M545 Low back pain: Secondary | ICD-10-CM | POA: Insufficient documentation

## 2019-04-05 DIAGNOSIS — Z510 Encounter for antineoplastic radiation therapy: Secondary | ICD-10-CM | POA: Insufficient documentation

## 2019-04-05 DIAGNOSIS — Z5111 Encounter for antineoplastic chemotherapy: Secondary | ICD-10-CM | POA: Insufficient documentation

## 2019-04-05 DIAGNOSIS — C7951 Secondary malignant neoplasm of bone: Secondary | ICD-10-CM | POA: Diagnosis not present

## 2019-04-05 DIAGNOSIS — C3411 Malignant neoplasm of upper lobe, right bronchus or lung: Secondary | ICD-10-CM | POA: Insufficient documentation

## 2019-04-05 MED FILL — fentaNYL 75 MCG/HR PT72: 75 | 15 days supply | Qty: 5 | Fill #0

## 2019-04-05 NOTE — Progress Notes (Signed)
  Radiation Oncology         [phone] ________________________________  Name: Rebecca Lucas MRN: [id number]  Date: 04/05/2019  DOB: Oct 19, 1955  SIMULATION AND TREATMENT PLANNING NOTE    ICD-10-CM   1. Bone metastasis ([facility])  C79.51   2. Primary cancer of right upper lobe of lung ([facility])  C34.11     DIAGNOSIS:  63 y.o. 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  Identity was confirmed.  All relevant records and images related to the planned course of therapy were reviewed.  The patient freely provided informed written consent to proceed with treatment after reviewing the details related to the planned course of therapy. The consent form was witnessed and verified by the simulation staff.  Then, the patient was set-up in a stable reproducible  supine position for radiation therapy.  CT images were obtained.  Surface markings were placed.  The CT images were loaded into the planning software.  Then the target and avoidance structures were contoured including kidneys.  Treatment planning then occurred.  The radiation prescription was entered and confirmed.  Then, I designed and supervised the construction of a total of 3 medically necessary complex treatment devices with VacLoc positioner and 2 MLCs to shield kidneys.  I have requested : 3D Simulation  I have requested a DVH of the following structures: Left Kidney, Right Kidney and target.  SPECIAL TREATMENT PROCEDURE:  The planned course of therapy using radiation constitutes a special treatment procedure. Special care is required in the management of this patient for the following reasons. This treatment constitutes a Special Treatment Procedure for the following reason: [ Retreatment in a previously radiated area requiring careful monitoring of increased risk of toxicity due to overlap of previous  treatment..  The special nature of the planned course of radiotherapy will require increased physician supervision and oversight to ensure patient's safety with optimal treatment outcomes.  PLAN:  The patient will receive 30 Gy in 10 fractions.  ________________________________  Sheral Apley Tammi Klippel, M.D.

## 2019-04-08 ENCOUNTER — Telehealth: Payer: Self-pay | Admitting: *Deleted

## 2019-04-08 ENCOUNTER — Inpatient Hospital Stay: Payer: 59

## 2019-04-08 ENCOUNTER — Other Ambulatory Visit: Payer: Self-pay

## 2019-04-08 ENCOUNTER — Other Ambulatory Visit: Payer: Self-pay | Admitting: Internal Medicine

## 2019-04-08 ENCOUNTER — Ambulatory Visit
Admission: RE | Admit: 2019-04-08 | Discharge: 2019-04-08 | Disposition: A | Discharge status: Home / Self Care | Payer: 59 | Source: Ambulatory Visit | Attending: Radiation Oncology | Admitting: Radiation Oncology

## 2019-04-08 DIAGNOSIS — M545 Low back pain: Secondary | ICD-10-CM | POA: Diagnosis not present

## 2019-04-08 DIAGNOSIS — C3411 Malignant neoplasm of upper lobe, right bronchus or lung: Secondary | ICD-10-CM | POA: Diagnosis not present

## 2019-04-08 DIAGNOSIS — C799 Secondary malignant neoplasm of unspecified site: Secondary | ICD-10-CM

## 2019-04-08 DIAGNOSIS — Z5112 Encounter for antineoplastic immunotherapy: Secondary | ICD-10-CM

## 2019-04-08 DIAGNOSIS — C7951 Secondary malignant neoplasm of bone: Secondary | ICD-10-CM | POA: Diagnosis not present

## 2019-04-08 DIAGNOSIS — Z5111 Encounter for antineoplastic chemotherapy: Secondary | ICD-10-CM | POA: Diagnosis not present

## 2019-04-08 DIAGNOSIS — Z510 Encounter for antineoplastic radiation therapy: Secondary | ICD-10-CM | POA: Diagnosis not present

## 2019-04-08 LAB — CBC WITH DIFFERENTIAL (CANCER CENTER ONLY)
Abs Immature Granulocytes: 1.77 10*3/uL — ABNORMAL HIGH (ref 0.00–0.07)
Basophils Absolute: 0.1 10*3/uL (ref 0.0–0.1)
Basophils Relative: 0 %
Eosinophils Absolute: 0 10*3/uL (ref 0.0–0.5)
Eosinophils Relative: 0 %
HCT: 33.6 % — ABNORMAL LOW (ref 36.0–46.0)
Hemoglobin: 10.3 g/dL — ABNORMAL LOW (ref 12.0–15.0)
Immature Granulocytes: 3 %
Lymphocytes Relative: 0 %
Lymphs Abs: 0.2 10*3/uL — ABNORMAL LOW (ref 0.7–4.0)
MCH: 30.5 pg (ref 26.0–34.0)
MCHC: 30.7 g/dL (ref 30.0–36.0)
MCV: 99.4 fL (ref 80.0–100.0)
Monocytes Absolute: 1.1 10*3/uL — ABNORMAL HIGH (ref 0.1–1.0)
Monocytes Relative: 2 %
Neutro Abs: 50 10*3/uL — ABNORMAL HIGH (ref 1.7–7.7)
Neutrophils Relative %: 95 %
Platelet Count: 400 10*3/uL (ref 150–400)
RBC: 3.38 MIL/uL — ABNORMAL LOW (ref 3.87–5.11)
RDW: 18.6 % — ABNORMAL HIGH (ref 11.5–15.5)
WBC Count: 53.2 10*3/uL (ref 4.0–10.5)
nRBC: 0 % (ref 0.0–0.2)

## 2019-04-08 LAB — CMP (CANCER CENTER ONLY)
ALT: 439 U/L (ref 0–44)
AST: 147 U/L — ABNORMAL HIGH (ref 15–41)
Albumin: 2.7 g/dL — ABNORMAL LOW (ref 3.5–5.0)
Alkaline Phosphatase: 311 U/L — ABNORMAL HIGH (ref 38–126)
Anion gap: 9 (ref 5–15)
BUN: 22 mg/dL (ref 8–23)
CO2: 24 mmol/L (ref 22–32)
Calcium: 8.9 mg/dL (ref 8.9–10.3)
Chloride: 103 mmol/L (ref 98–111)
Creatinine: 0.71 mg/dL (ref 0.44–1.00)
GFR, Est AFR Am: >60 mL/min (ref >60)
GFR, Estimated: >60 mL/min (ref >60)
Glucose, Bld: 128 mg/dL — ABNORMAL HIGH (ref 70–99)
Potassium: 4.8 mmol/L (ref 3.5–5.1)
Sodium: 136 mmol/L (ref 135–145)
Total Bilirubin: 0.2 mg/dL — ABNORMAL LOW (ref 0.3–1.2)
Total Protein: 6.9 g/dL (ref 6.5–8.1)

## 2019-04-08 MED FILL — OMEPRAZOLE 20 MG CAP: 20 | 90 days supply | Qty: 90 | Fill #0

## 2019-04-08 NOTE — Telephone Encounter (Signed)
1:57 pm: Received call report from [facility].  "Today's ALT = 439."  Note to collaborative with results.

## 2019-04-08 NOTE — Telephone Encounter (Signed)
Received call report from [facility].  "Today's WBC = 53.2."  Result note to collaborative nurse for provider.

## 2019-04-08 NOTE — Telephone Encounter (Signed)
Per Dr Rebecca Lucas pt was notified of AST and ALT result and to continue to avoid acetaminophen.Lab recheck next wed.

## 2019-04-09 ENCOUNTER — Ambulatory Visit
Admission: RE | Admit: 2019-04-09 | Discharge: 2019-04-09 | Disposition: A | Discharge status: Home / Self Care | Payer: 59 | Source: Ambulatory Visit | Attending: Radiation Oncology | Admitting: Radiation Oncology

## 2019-04-09 ENCOUNTER — Other Ambulatory Visit: Payer: Self-pay

## 2019-04-09 DIAGNOSIS — Z510 Encounter for antineoplastic radiation therapy: Secondary | ICD-10-CM | POA: Diagnosis not present

## 2019-04-09 DIAGNOSIS — M545 Low back pain: Secondary | ICD-10-CM | POA: Diagnosis not present

## 2019-04-09 DIAGNOSIS — C3411 Malignant neoplasm of upper lobe, right bronchus or lung: Secondary | ICD-10-CM | POA: Diagnosis not present

## 2019-04-09 DIAGNOSIS — Z5111 Encounter for antineoplastic chemotherapy: Secondary | ICD-10-CM | POA: Diagnosis not present

## 2019-04-09 DIAGNOSIS — C7951 Secondary malignant neoplasm of bone: Secondary | ICD-10-CM | POA: Diagnosis not present

## 2019-04-10 ENCOUNTER — Ambulatory Visit
Admission: RE | Admit: 2019-04-10 | Discharge: 2019-04-10 | Disposition: A | Discharge status: Home / Self Care | Payer: 59 | Source: Ambulatory Visit | Attending: Radiation Oncology | Admitting: Radiation Oncology

## 2019-04-10 ENCOUNTER — Other Ambulatory Visit: Payer: Self-pay

## 2019-04-10 DIAGNOSIS — Z510 Encounter for antineoplastic radiation therapy: Secondary | ICD-10-CM | POA: Diagnosis not present

## 2019-04-10 DIAGNOSIS — Z681 Body mass index (BMI) 19 or less, adult: Secondary | ICD-10-CM | POA: Diagnosis not present

## 2019-04-10 DIAGNOSIS — C3490 Malignant neoplasm of unspecified part of unspecified bronchus or lung: Secondary | ICD-10-CM | POA: Diagnosis not present

## 2019-04-10 DIAGNOSIS — C7951 Secondary malignant neoplasm of bone: Secondary | ICD-10-CM | POA: Diagnosis not present

## 2019-04-10 DIAGNOSIS — M5416 Radiculopathy, lumbar region: Secondary | ICD-10-CM | POA: Diagnosis not present

## 2019-04-10 DIAGNOSIS — C3411 Malignant neoplasm of upper lobe, right bronchus or lung: Secondary | ICD-10-CM | POA: Diagnosis not present

## 2019-04-10 DIAGNOSIS — R030 Elevated blood-pressure reading, without diagnosis of hypertension: Secondary | ICD-10-CM | POA: Diagnosis not present

## 2019-04-10 DIAGNOSIS — M545 Low back pain: Secondary | ICD-10-CM | POA: Diagnosis not present

## 2019-04-10 DIAGNOSIS — Z5111 Encounter for antineoplastic chemotherapy: Secondary | ICD-10-CM | POA: Diagnosis not present

## 2019-04-11 ENCOUNTER — Ambulatory Visit
Admission: RE | Admit: 2019-04-11 | Discharge: 2019-04-11 | Disposition: A | Discharge status: Home / Self Care | Payer: 59 | Source: Ambulatory Visit | Attending: Radiation Oncology | Admitting: Radiation Oncology

## 2019-04-11 ENCOUNTER — Other Ambulatory Visit: Payer: Self-pay

## 2019-04-11 DIAGNOSIS — Z510 Encounter for antineoplastic radiation therapy: Secondary | ICD-10-CM | POA: Diagnosis not present

## 2019-04-11 DIAGNOSIS — C3411 Malignant neoplasm of upper lobe, right bronchus or lung: Secondary | ICD-10-CM | POA: Diagnosis not present

## 2019-04-11 DIAGNOSIS — M545 Low back pain: Secondary | ICD-10-CM | POA: Diagnosis not present

## 2019-04-11 DIAGNOSIS — Z5111 Encounter for antineoplastic chemotherapy: Secondary | ICD-10-CM | POA: Diagnosis not present

## 2019-04-11 DIAGNOSIS — C7951 Secondary malignant neoplasm of bone: Secondary | ICD-10-CM | POA: Diagnosis not present

## 2019-04-12 ENCOUNTER — Other Ambulatory Visit: Payer: Self-pay

## 2019-04-12 ENCOUNTER — Ambulatory Visit
Admission: RE | Admit: 2019-04-12 | Discharge: 2019-04-12 | Disposition: A | Discharge status: Home / Self Care | Payer: 59 | Source: Ambulatory Visit | Attending: Radiation Oncology | Admitting: Radiation Oncology

## 2019-04-12 DIAGNOSIS — Z510 Encounter for antineoplastic radiation therapy: Secondary | ICD-10-CM | POA: Diagnosis not present

## 2019-04-12 DIAGNOSIS — C3411 Malignant neoplasm of upper lobe, right bronchus or lung: Secondary | ICD-10-CM | POA: Diagnosis not present

## 2019-04-12 DIAGNOSIS — C7951 Secondary malignant neoplasm of bone: Secondary | ICD-10-CM | POA: Diagnosis not present

## 2019-04-12 DIAGNOSIS — Z5111 Encounter for antineoplastic chemotherapy: Secondary | ICD-10-CM | POA: Diagnosis not present

## 2019-04-12 DIAGNOSIS — M545 Low back pain: Secondary | ICD-10-CM | POA: Diagnosis not present

## 2019-04-15 ENCOUNTER — Telehealth: Payer: Self-pay | Admitting: Medical Oncology

## 2019-04-15 ENCOUNTER — Other Ambulatory Visit: Payer: Self-pay | Admitting: Internal Medicine

## 2019-04-15 ENCOUNTER — Other Ambulatory Visit: Payer: Self-pay

## 2019-04-15 ENCOUNTER — Ambulatory Visit
Admission: RE | Admit: 2019-04-15 | Discharge: 2019-04-15 | Disposition: A | Discharge status: Home / Self Care | Payer: 59 | Source: Ambulatory Visit | Attending: Radiation Oncology | Admitting: Radiation Oncology

## 2019-04-15 DIAGNOSIS — C3411 Malignant neoplasm of upper lobe, right bronchus or lung: Secondary | ICD-10-CM | POA: Insufficient documentation

## 2019-04-15 DIAGNOSIS — C7951 Secondary malignant neoplasm of bone: Secondary | ICD-10-CM | POA: Insufficient documentation

## 2019-04-15 DIAGNOSIS — C7972 Secondary malignant neoplasm of left adrenal gland: Secondary | ICD-10-CM | POA: Diagnosis not present

## 2019-04-15 DIAGNOSIS — C7949 Secondary malignant neoplasm of other parts of nervous system: Secondary | ICD-10-CM | POA: Diagnosis not present

## 2019-04-15 DIAGNOSIS — Z5111 Encounter for antineoplastic chemotherapy: Secondary | ICD-10-CM | POA: Insufficient documentation

## 2019-04-15 DIAGNOSIS — Z79899 Other long term (current) drug therapy: Secondary | ICD-10-CM | POA: Diagnosis not present

## 2019-04-15 DIAGNOSIS — M545 Low back pain: Secondary | ICD-10-CM | POA: Insufficient documentation

## 2019-04-15 DIAGNOSIS — Z5112 Encounter for antineoplastic immunotherapy: Secondary | ICD-10-CM | POA: Diagnosis not present

## 2019-04-15 DIAGNOSIS — C801 Malignant (primary) neoplasm, unspecified: Secondary | ICD-10-CM | POA: Diagnosis not present

## 2019-04-15 DIAGNOSIS — Z510 Encounter for antineoplastic radiation therapy: Secondary | ICD-10-CM | POA: Insufficient documentation

## 2019-04-15 MED ORDER — DEXAMETHASONE 4 MG PO TABS: 4.0000 mg | ORAL_TABLET | Freq: Four times a day (QID) | ORAL | 0 refills | Status: DC

## 2019-04-15 MED ORDER — FENTANYL 75 MCG/HR TD PT72: 1.0000 | MEDICATED_PATCH | TRANSDERMAL | 0 refills | Status: DC

## 2019-04-15 MED FILL — DEXAMETHASONE 4 MG TABLET: 4 | 15 days supply | Qty: 60 | Fill #0

## 2019-04-15 NOTE — Telephone Encounter (Signed)
Requested Refills- If  she needs to continue decadron her last pill is tomorrow. Fentanyl-last patch she will put on Thursday .

## 2019-04-16 ENCOUNTER — Ambulatory Visit
Admission: RE | Admit: 2019-04-16 | Discharge: 2019-04-16 | Disposition: A | Discharge status: Home / Self Care | Payer: 59 | Source: Ambulatory Visit | Attending: Radiation Oncology | Admitting: Radiation Oncology

## 2019-04-16 ENCOUNTER — Other Ambulatory Visit: Payer: Self-pay

## 2019-04-16 DIAGNOSIS — C7972 Secondary malignant neoplasm of left adrenal gland: Secondary | ICD-10-CM | POA: Diagnosis not present

## 2019-04-16 DIAGNOSIS — Z5112 Encounter for antineoplastic immunotherapy: Secondary | ICD-10-CM | POA: Diagnosis not present

## 2019-04-16 DIAGNOSIS — C801 Malignant (primary) neoplasm, unspecified: Secondary | ICD-10-CM | POA: Diagnosis not present

## 2019-04-16 DIAGNOSIS — Z5111 Encounter for antineoplastic chemotherapy: Secondary | ICD-10-CM | POA: Diagnosis not present

## 2019-04-16 DIAGNOSIS — C7949 Secondary malignant neoplasm of other parts of nervous system: Secondary | ICD-10-CM | POA: Diagnosis not present

## 2019-04-16 DIAGNOSIS — C7951 Secondary malignant neoplasm of bone: Secondary | ICD-10-CM | POA: Diagnosis not present

## 2019-04-16 DIAGNOSIS — Z79899 Other long term (current) drug therapy: Secondary | ICD-10-CM | POA: Diagnosis not present

## 2019-04-17 ENCOUNTER — Inpatient Hospital Stay: Payer: 59

## 2019-04-17 ENCOUNTER — Ambulatory Visit
Admission: RE | Admit: 2019-04-17 | Discharge: 2019-04-17 | Disposition: A | Discharge status: Home / Self Care | Payer: 59 | Source: Ambulatory Visit | Attending: Radiation Oncology | Admitting: Radiation Oncology

## 2019-04-17 ENCOUNTER — Other Ambulatory Visit: Payer: Self-pay

## 2019-04-17 ENCOUNTER — Other Ambulatory Visit: Payer: Self-pay | Admitting: Internal Medicine

## 2019-04-17 ENCOUNTER — Inpatient Hospital Stay: Payer: 59 | Attending: Internal Medicine | Admitting: Physician Assistant

## 2019-04-17 VITALS — BP 116/65 | HR 113 | Temp 98.5°F | Resp 18 | Ht 67.0 in | Wt 115.1 lb

## 2019-04-17 DIAGNOSIS — C801 Malignant (primary) neoplasm, unspecified: Secondary | ICD-10-CM | POA: Diagnosis not present

## 2019-04-17 DIAGNOSIS — C3411 Malignant neoplasm of upper lobe, right bronchus or lung: Secondary | ICD-10-CM | POA: Diagnosis not present

## 2019-04-17 DIAGNOSIS — Z79899 Other long term (current) drug therapy: Secondary | ICD-10-CM | POA: Diagnosis not present

## 2019-04-17 DIAGNOSIS — G893 Neoplasm related pain (acute) (chronic): Secondary | ICD-10-CM | POA: Diagnosis not present

## 2019-04-17 DIAGNOSIS — Z5112 Encounter for antineoplastic immunotherapy: Secondary | ICD-10-CM | POA: Diagnosis not present

## 2019-04-17 DIAGNOSIS — C7949 Secondary malignant neoplasm of other parts of nervous system: Secondary | ICD-10-CM | POA: Insufficient documentation

## 2019-04-17 DIAGNOSIS — Z7189 Other specified counseling: Secondary | ICD-10-CM | POA: Diagnosis not present

## 2019-04-17 DIAGNOSIS — C7972 Secondary malignant neoplasm of left adrenal gland: Secondary | ICD-10-CM | POA: Insufficient documentation

## 2019-04-17 DIAGNOSIS — C7951 Secondary malignant neoplasm of bone: Secondary | ICD-10-CM | POA: Diagnosis not present

## 2019-04-17 DIAGNOSIS — C799 Secondary malignant neoplasm of unspecified site: Secondary | ICD-10-CM

## 2019-04-17 DIAGNOSIS — Z5111 Encounter for antineoplastic chemotherapy: Secondary | ICD-10-CM | POA: Diagnosis not present

## 2019-04-17 LAB — CBC WITH DIFFERENTIAL (CANCER CENTER ONLY)
Abs Immature Granulocytes: 1.27 10*3/uL — ABNORMAL HIGH (ref 0.00–0.07)
Basophils Absolute: 0.1 10*3/uL (ref 0.0–0.1)
Basophils Relative: 0 %
Eosinophils Absolute: 0 10*3/uL (ref 0.0–0.5)
Eosinophils Relative: 0 %
HCT: 31.8 % — ABNORMAL LOW (ref 36.0–46.0)
Hemoglobin: 9.8 g/dL — ABNORMAL LOW (ref 12.0–15.0)
Immature Granulocytes: 4 %
Lymphocytes Relative: 0 %
Lymphs Abs: 0.1 10*3/uL — ABNORMAL LOW (ref 0.7–4.0)
MCH: 30.3 pg (ref 26.0–34.0)
MCHC: 30.8 g/dL (ref 30.0–36.0)
MCV: 98.5 fL (ref 80.0–100.0)
Monocytes Absolute: 1.2 10*3/uL — ABNORMAL HIGH (ref 0.1–1.0)
Monocytes Relative: 4 %
Neutro Abs: 30.4 10*3/uL — ABNORMAL HIGH (ref 1.7–7.7)
Neutrophils Relative %: 92 %
Platelet Count: 256 10*3/uL (ref 150–400)
RBC: 3.23 MIL/uL — ABNORMAL LOW (ref 3.87–5.11)
RDW: 17.7 % — ABNORMAL HIGH (ref 11.5–15.5)
WBC Count: 33 10*3/uL — ABNORMAL HIGH (ref 4.0–10.5)
nRBC: 0 % (ref 0.0–0.2)

## 2019-04-17 LAB — CMP (CANCER CENTER ONLY)
ALT: 196 U/L — ABNORMAL HIGH (ref 0–44)
AST: 79 U/L — ABNORMAL HIGH (ref 15–41)
Albumin: 2.4 g/dL — ABNORMAL LOW (ref 3.5–5.0)
Alkaline Phosphatase: 189 U/L — ABNORMAL HIGH (ref 38–126)
Anion gap: 11 (ref 5–15)
BUN: 22 mg/dL (ref 8–23)
CO2: 23 mmol/L (ref 22–32)
Calcium: 8.7 mg/dL — ABNORMAL LOW (ref 8.9–10.3)
Chloride: 102 mmol/L (ref 98–111)
Creatinine: 0.63 mg/dL (ref 0.44–1.00)
GFR, Est AFR Am: >60 mL/min (ref >60)
GFR, Estimated: >60 mL/min (ref >60)
Glucose, Bld: 94 mg/dL (ref 70–99)
Potassium: 4.6 mmol/L (ref 3.5–5.1)
Sodium: 136 mmol/L (ref 135–145)
Total Bilirubin: <0.2 mg/dL — ABNORMAL LOW (ref 0.3–1.2)
Total Protein: 6.1 g/dL — ABNORMAL LOW (ref 6.5–8.1)

## 2019-04-17 MED ORDER — DEXAMETHASONE 4 MG PO TABS: ORAL_TABLET | ORAL | 2 refills | Status: DC

## 2019-04-17 MED ORDER — FENTANYL 75 MCG/HR TD PT72: 1.0000 | MEDICATED_PATCH | TRANSDERMAL | 0 refills | Status: DC

## 2019-04-17 MED ORDER — SODIUM CHLORIDE 0.9% FLUSH
10.0000 mL | INTRAVENOUS | Status: DC | PRN
  Administered 2019-04-17: 10 mL
  Filled 2019-04-17: qty 10

## 2019-04-17 MED ORDER — OXYCODONE HCL 5 MG PO TABS: 5.0000 mg | ORAL_TABLET | Freq: Four times a day (QID) | ORAL | 0 refills | Status: DC | PRN

## 2019-04-17 MED ORDER — HEPARIN SOD (PORK) LOCK FLUSH 100 UNIT/ML IV SOLN
500.0000 [IU] | Freq: Once | INTRAVENOUS | Status: AC | PRN
  Administered 2019-04-17: 500 [IU]
  Filled 2019-04-17: qty 5

## 2019-04-17 MED FILL — fentaNYL 75 MCG/HR PT72: 75 | 15 days supply | Qty: 5 | Fill #0

## 2019-04-17 MED FILL — oxyCODONE HCL 5 MG TABS: 5 | 15 days supply | Qty: 60 | Fill #0

## 2019-04-17 NOTE — Patient Instructions (Signed)
-New treatment is with chemotherapy called Docetaxel and Cyramza.  -Treatment is one day every 3 weeks with an injection appointment (the same day or different day depending on insurance). We will monitor your labs every week to make sure your blood counts don't drop significantly.  -I will arrange for advance home care and physical therapy. Be on the look out for a phone call from them.  -I have sent a refill for the pain medication. I also have sent the steroids to your pharmacy.  -Possible adverse effect of this treatment include but not limited to alopecia, myelosuppression, nausea and vomiting, peripheral neuropathy, liver or renal dysfunction as well as the adverse effect of Cyramza including increased risk for hemorrhage and GI perforation.  The patient was provided a handout with information to continue treatment. -Start taping your steroid. Start taking 3 tablets per day for 1 week. Then take 2 tablets a day for 1 week. Then take 1 tablet a day for one week. Of course, on the day before chemo, the day chemo, and the day after chemo, take 2 tablets of decadron TWICE a day (total of 4 tablets for 3 consecuative days. Total 12 tablets).   Ramucirumab injection What is this medicine? RAMUCIRUMAB (ra mue SIR ue mab) is a monoclonal antibody. It is used to treat stomach cancer, colorectal cancer, liver cancer, and lung cancer. This medicine may be used for other purposes; ask your health care provider or pharmacist if you have questions. COMMON BRAND NAME(S): Cyramza What should I tell my health care provider before I take this medicine? They need to know if you have any of these conditions:  bleeding disorders  blood clots  heart disease, including heart failure, heart attack, or chest pain (angina)  high blood pressure  infection (especially a virus infection such as chickenpox, cold sores, or herpes)  protein in your urine  recent or planning to have surgery  stroke  an unusual  or allergic reaction to ramucirumab, other medicines, foods, dyes, or preservatives  pregnant or trying to get pregnant  breast-feeding How should I use this medicine? This medicine is for infusion into a vein. It is given by a health care professional in a hospital or clinic setting. Talk to your pediatrician regarding the use of this medicine in children. Special care may be needed. Overdosage: If you think you have taken too much of this medicine contact a poison control center or emergency room at once. NOTE: This medicine is only for you. Do not share this medicine with others. What if I miss a dose? It is important not to miss your dose. Call your doctor or health care professional if you are unable to keep an appointment. What may interact with this medicine? Interactions have not been studied. This list may not describe all possible interactions. Give your health care provider a list of all the medicines, herbs, non-prescription drugs, or dietary supplements you use. Also tell them if you smoke, drink alcohol, or use illegal drugs. Some items may interact with your medicine. What should I watch for while using this medicine? Your condition will be monitored carefully while you are receiving this medicine. You will need to to check your blood pressure and have your blood and urine tested while you are taking this medicine. Your condition will be monitored carefully while you are receiving this medicine. This medicine may increase your risk to bruise or bleed. Call your doctor or health care professional if you notice any unusual bleeding.  This medicine may rarely cause 'gastrointestinal perforation' (holes in the stomach, intestines or colon), a serious side effect requiring surgery to repair. This medicine should be started at least 2 weeks following major surgery and the site of the surgery should be totally healed. Check with your doctor before scheduling dental work or surgery while  you are receiving this treatment. Talk to your doctor if you have recently had surgery or if you have a wound that has not healed. Do not become pregnant while taking this medicine or for 3 months after stopping it. Women should inform their doctor if they wish to become pregnant or think they might be pregnant. There is a potential for serious side effects to an unborn child. Talk to your health care professional or pharmacist for more information. Do not breast-feed an infant while taking this medicine or for 2 months after stopping it. This medicine may interfere with the ability to have a child. Talk with your doctor or health care professional if you are concerned about your fertility. What side effects may I notice from receiving this medicine? Side effects that you should report to your doctor or health care professional as soon as possible:  allergic reactions like skin rash, itching or hives, breathing problems, swelling of the face, lips, or tongue  signs of infection - fever or chills, cough, sore throat  chest pain or chest tightness  confusion  dizziness  feeling faint or lightheaded, falls  severe abdominal pain  severe nausea, vomiting  signs and symptoms of bleeding such as bloody or black, tarry stools; red or dark-brown urine; spitting up blood or brown material that looks like coffee grounds; red spots on the skin; unusual bruising or bleeding from the eye, gums, or nose  signs and symptoms of a blood clot such as breathing problems; changes in vision; chest pain; severe, sudden headache; pain, swelling, warmth in the leg; trouble speaking; sudden numbness or weakness of the face, arm or leg  symptoms of a stroke: change in mental awareness, inability to talk or move one side of the body  trouble walking, dizziness, loss of balance or coordination Side effects that usually do not require medical attention (report to your doctor or health care professional if they  continue or are bothersome):  cold, clammy skin  constipation  diarrhea  headache  nausea, vomiting  stomach pain  unusually slow heartbeat  unusually weak or tired This list may not describe all possible side effects. Call your doctor for medical advice about side effects. You may report side effects to FDA at 1-800-FDA-1088. Where should I keep my medicine? This drug is given in a hospital or clinic and will not be stored at home. NOTE: This sheet is a summary. It may not cover all possible information. If you have questions about this medicine, talk to your doctor, pharmacist, or health care provider.  2020 [employer]/Gold Standard (2018-11-05 11:44:01) Docetaxel injection What is this medicine? DOCETAXEL (doe se TAX el) is a chemotherapy drug. It targets fast dividing cells, like cancer cells, and causes these cells to die. This medicine is used to treat many types of cancers like breast cancer, certain stomach cancers, head and neck cancer, lung cancer, and prostate cancer. This medicine may be used for other purposes; ask your health care provider or pharmacist if you have questions. COMMON BRAND NAME(S): Docefrez, Taxotere What should I tell my health care provider before I take this medicine? They need to know if you have any of  these conditions:  infection (especially a virus infection such as chickenpox, cold sores, or herpes)  liver disease  low blood counts, like low white cell, platelet, or red cell counts  an unusual or allergic reaction to docetaxel, polysorbate 80, other chemotherapy agents, other medicines, foods, dyes, or preservatives  pregnant or trying to get pregnant  breast-feeding How should I use this medicine? This drug is given as an infusion into a vein. It is administered in a hospital or clinic by a specially trained health care professional. Talk to your pediatrician regarding the use of this medicine in children. Special care may be  needed. Overdosage: If you think you have taken too much of this medicine contact a poison control center or emergency room at once. NOTE: This medicine is only for you. Do not share this medicine with others. What if I miss a dose? It is important not to miss your dose. Call your doctor or health care professional if you are unable to keep an appointment. What may interact with this medicine?  aprepitant  certain antibiotics like erythromycin or clarithromycin  certain antivirals for HIV or hepatitis  certain medicines for fungal infections like fluconazole, itraconazole, ketoconazole, posaconazole, or voriconazole  cimetidine  ciprofloxacin  conivaptan  cyclosporine  dronedarone  fluvoxamine  grapefruit juice  imatinib  verapamil This list may not describe all possible interactions. Give your health care provider a list of all the medicines, herbs, non-prescription drugs, or dietary supplements you use. Also tell them if you smoke, drink alcohol, or use illegal drugs. Some items may interact with your medicine. What should I watch for while using this medicine? Your condition will be monitored carefully while you are receiving this medicine. You will need important blood work done while you are taking this medicine. Call your doctor or health care professional for advice if you get a fever, chills or sore throat, or other symptoms of a cold or flu. Do not treat yourself. This drug decreases your body's ability to fight infections. Try to avoid being around people who are sick. Some products may contain alcohol. Ask your health care professional if this medicine contains alcohol. Be sure to tell all health care professionals you are taking this medicine. Certain medicines, like metronidazole and disulfiram, can cause an unpleasant reaction when taken with alcohol. The reaction includes flushing, headache, nausea, vomiting, sweating, and increased thirst. The reaction can last  from 30 minutes to several hours. You may get drowsy or dizzy. Do not drive, use machinery, or do anything that needs mental alertness until you know how this medicine affects you. Do not stand or sit up quickly, especially if you are an older patient. This reduces the risk of dizzy or fainting spells. Alcohol may interfere with the effect of this medicine. Talk to your health care professional about your risk of cancer. You may be more at risk for certain types of cancer if you take this medicine. Do not become pregnant while taking this medicine or for 6 months after stopping it. Women should inform their doctor if they wish to become pregnant or think they might be pregnant. There is a potential for serious side effects to an unborn child. Talk to your health care professional or pharmacist for more information. Do not breast-feed an infant while taking this medicine or for 1 to 2 weeks after stopping it. Males who get this medicine must use a condom during sex with females who can get pregnant. If you get a woman  pregnant, the baby could have birth defects. The baby could die before they are born. You will need to continue wearing a condom for 3 months after stopping the medicine. Tell your health care provider right away if your partner becomes pregnant while you are taking this medicine. This may interfere with the ability to father a child. You should talk to your doctor or health care professional if you are concerned about your fertility. What side effects may I notice from receiving this medicine? Side effects that you should report to your doctor or health care professional as soon as possible:  allergic reactions like skin rash, itching or hives, swelling of the face, lips, or tongue  blurred vision  breathing problems  changes in vision  low blood counts - This drug may decrease the number of white blood cells, red blood cells and platelets. You may be at increased risk for infections  and bleeding.  nausea and vomiting  pain, redness or irritation at site where injected  pain, tingling, numbness in the hands or feet  redness, blistering, peeling, or loosening of the skin, including inside the mouth  signs of decreased platelets or bleeding - bruising, pinpoint red spots on the skin, black, tarry stools, nosebleeds  signs of decreased red blood cells - unusually weak or tired, fainting spells, lightheadedness  signs of infection - fever or chills, cough, sore throat, pain or difficulty passing urine  swelling of the ankle, feet, hands Side effects that usually do not require medical attention (report to your doctor or health care professional if they continue or are bothersome):  constipation  diarrhea  fingernail or toenail changes  hair loss  loss of appetite  mouth sores  muscle pain This list may not describe all possible side effects. Call your doctor for medical advice about side effects. You may report side effects to FDA at 1-800-FDA-1088. Where should I keep my medicine? This drug is given in a hospital or clinic and will not be stored at home. NOTE: This sheet is a summary. It may not cover all possible information. If you have questions about this medicine, talk to your doctor, pharmacist, or health care provider.  2020 [employer]/Gold Standard (2018-11-02 12:23:11)

## 2019-04-17 NOTE — Progress Notes (Signed)
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 There is also epidural invasion into L1-L3. Metastatic sarcoma cannot be also excluded at this point.  Molecular studies by foundation 1 showed no actionable mutations. PDL 1 expression 99%  PRIOR THERAPY:  1) Palliative radiotherapy to the neck mass and hip lesion under the care of Dr. Tammi Klippel  2) palliative radiotherapy to the left adrenal gland and periaortic lymph node under the care of Dr. Tammi Klippel. 3) Palliative radiotherapy to the lumbar metastasis under the care of Dr. Tammi Klippel. Completed on 04/18/2019. 4) Systemic chemotherapy with carboplatin for AUC of 5, Alimta 500 mg/M2 and Keytruda 200 mg IV every 3 weeks. Status post 17 cycles. Starting from cycle #5 the patient was on maintenance treatment with Alimta and Keytruda. Last dose on 03/14/2019  CURRENT THERAPY: Palliative systemic chemotherapy with Docetaxel 75 mg/m2 and Cyramza 10 mg/kg IV every 3 weeks with neulasta support. First dose expected on 04/24/2019.  INTERVAL HISTORY: Rebecca Lucas 63 y.o. female returns to the clinic for a follow-up visit.  The patient was previously undergoing chemotherapy with Alimta and Keytruda. This treatment is on hold secondary to a recent development of epidural tumor extension near L1-L3.  The patient is currently receiving palliative radiation therapy to this area and is status post 9 treatments. Her last radiation treatment is scheduled for tomorrow.  She also has been seen by neurosurgery and has a follow-up appointment with them in a couple of weeks.  The patient is currently taking 4 mg p.o. every 6 hours of Decadron to decrease swelling around the progressive  tumor in the lumbar spine.  Since the patient has been receiving radiation treatment, she states that her pain is significantly improved.  Her current pain regimen consists of 75 mcg fentanyl patches as well as oxycodone every 6 hours as needed.  The patient is no longer taking medication with Tylenol due to her recent elevations in her liver enzymes. 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  The patient states that her leg is basically "dead". She expresses concern with needing assistance with personal cares at home. She uses a walker for assistance.  The patient has great family support and her son was recently in town to assist her the last 2 weeks and her father is coming to town to provide assistance for a few days.  Her husband also provides assistance when he is able. However, the patient's family is not always available at all times.   Otherwise, She denied having any current chest pain, shortness of breath, cough or hemoptysis.  She denied having any fever or chills.  She denies any nausea, vomiting, diarrhea, constipation.  She is here today for reevaluate of her current condition and treatment options.  MEDICAL HISTORY:No past medical history on file.  ALLERGIES:  has No Known Allergies.  MEDICATIONS:  Current Outpatient Medications  Medication Sig Dispense Refill  . acetaminophen (TYLENOL) 500 MG tablet Take 1,000 mg by mouth every 6 (six) hours as needed for mild pain.     Marland Kitchen alendronate (FOSAMAX) 70 MG tablet Take 70 mg by mouth once a week. Wednesday  3  . butalbital-aspirin-caffeine (FIORINAL) 50-325-40 MG  capsule Take 1 capsule by mouth every 6 (six) hours as needed for migraine.   1  . Calcium Carbonate-Vitamin D (CALCIUM-D PO) Take 1 tablet by mouth 2 (two) times daily.    Marland Kitchen dexamethasone (DECADRON) 4 MG tablet Take 1 tablet (4 mg total) by mouth 4 (four) times daily. 60 tablet 0   . fentaNYL (DURAGESIC) 75 MCG/HR Place 1 patch onto the skin every 3 (three) days for 15 days. 5 patch 0  . folic acid (FOLVITE) 1 MG tablet Take 1 tablet (1 mg total) by mouth daily. 90 tablet 1  . gabapentin (NEURONTIN) 300 MG capsule Take 1 capsule (300 mg total) by mouth 3 (three) times daily. 90 capsule 0  . ibuprofen (ADVIL,MOTRIN) 200 MG tablet Take 600 mg by mouth every 6 (six) hours as needed for mild pain.     Marland Kitchen lidocaine-prilocaine (EMLA) cream Apply 1 application topically as needed. 30 g 0  . LINZESS 72 MCG capsule TAKE 1 CAPSULE BY MOUTH ONCE DAILY BEFORE BREAKFAST 30 capsule 0  . LUTEIN PO Take 1 tablet by mouth daily.    Marland Kitchen omeprazole (PRILOSEC) 20 MG capsule TAKE 1 CAPSULE (20 MG TOTAL) BY MOUTH DAILY. 90 capsule 1  . ondansetron (ZOFRAN) 8 MG tablet TAKE 1 TABLET (8 MG TOTAL) BY MOUTH EVERY 8 HOURS AS NEEDED FOR NAUSEA OR VOMITING. 20 tablet 0  . oxyCODONE (OXY IR/ROXICODONE) 5 MG immediate release tablet Take 1 tablet (5 mg total) by mouth every 6 (six) hours as needed for severe pain. 60 tablet 0  . traMADol (ULTRAM) 50 MG tablet Take 1 tablet (50 mg total) by mouth every 6 (six) hours as needed for moderate pain. 120 tablet 0  . zolpidem (AMBIEN) 10 MG tablet Take 1 tablet (10 mg total) by mouth at bedtime as needed for sleep. 30 tablet 0  . dexamethasone (DECADRON) 4 MG tablet Take 2 tablets twice a day (4 tablets total per day) the day before, the day of, and the day after chemotherapy 40 tablet 2   No current facility-administered medications for this visit.     SURGICAL HISTORY:  Past Surgical History:  Procedure Laterality Date  . IR IMAGING GUIDED PORT INSERTION  06/26/2018    REVIEW OF SYSTEMS:   Review of Systems  Constitutional: Positive for fatigue. Negative for appetite change, chills, fever and unexpected weight change.  HENT: Negative for mouth sores, nosebleeds, sore throat and trouble swallowing.   Eyes: Negative for eye problems and icterus.   Respiratory: Negative for cough, hemoptysis, shortness of breath and wheezing.   Cardiovascular: Negative for chest pain and leg swelling.  Gastrointestinal: Negative for abdominal pain, constipation, diarrhea, nausea and vomiting.  Genitourinary: Negative for bladder incontinence, difficulty urinating, dysuria, frequency and hematuria.   Musculoskeletal: Positive for back pain and gait abnormalities secondary to her leg weakness. Negative for neck pain and neck stiffness.  Skin: Negative for itching and rash.  Neurological: Positive for muscle weakness and lower extremity weakness, particularly in the left leg. Negative for dizziness, headaches, light-headedness and seizures.  Hematological: Negative for adenopathy. Does not bruise/bleed easily.  Psychiatric/Behavioral: Negative for confusion, depression and sleep disturbance. The patient is not nervous/anxious.     PHYSICAL EXAMINATION:  Blood pressure 116/65, pulse (!) 113, temperature 98.5 F (36.9 C), temperature source Oral, resp. rate 18, height 5\' 7"  (1.702 m), weight 115 lb 1.6 oz (52.2 kg), SpO2 100 %.  ECOG PERFORMANCE STATUS: 2 - Symptomatic, <50% confined to bed  Physical Exam  Constitutional: Oriented to person, place, and time and ill-appearing female and in no distress.  HENT:  Head: Normocephalic and atraumatic.  Mouth/Throat: Oropharynx is clear and moist. No oropharyngeal exudate.  Eyes: Conjunctivae are normal. Right eye exhibits no discharge. Left eye exhibits no discharge. No scleral icterus.  Neck: Normal range of motion. Neck supple.  Cardiovascular: Normal rate, regular rhythm, normal heart sounds and intact distal pulses.   Pulmonary/Chest: Effort normal and breath sounds normal. No respiratory distress. No wheezes. No rales.  Abdominal: Soft. Bowel sounds are normal. Exhibits no distension and no mass. There is no tenderness.  Musculoskeletal: Exhibits no edema.  Lymphadenopathy:    No cervical adenopathy.   Neurological: Alert and oriented to person, place, and time. Weakness in her lower extremities. Coordination normal.  Skin: Skin is warm and dry. No rash noted. Not diaphoretic. No erythema. No pallor.  Psychiatric: Mood, memory and judgment normal.  Vitals reviewed.  LABORATORY DATA: Lab Results  Component Value Date   WBC 33.0 (H) 04/17/2019   HGB 9.8 (L) 04/17/2019   HCT 31.8 (L) 04/17/2019   MCV 98.5 04/17/2019   PLT 256 04/17/2019      Chemistry      Component Value Date/Time   NA 136 04/17/2019 1221   K 4.6 04/17/2019 1221   CL 102 04/17/2019 1221   CO2 23 04/17/2019 1221   BUN 22 04/17/2019 1221   CREATININE 0.63 04/17/2019 1221      Component Value Date/Time   CALCIUM 8.7 (L) 04/17/2019 1221   ALKPHOS 189 (H) 04/17/2019 1221   AST 79 (H) 04/17/2019 1221   ALT 196 (H) 04/17/2019 1221   BILITOT <0.2 (L) 04/17/2019 1221       RADIOGRAPHIC STUDIES:  Mr Lumbar Spine W Wo Contrast  Result Date: 03/21/2019 CLINICAL DATA:  64 year old female with metastatic lung cancer. Pathologic compression of L2 with some epidural tumor. Increasing back pain. EXAM: MRI LUMBAR SPINE WITHOUT AND WITH CONTRAST TECHNIQUE: Multiplanar and multiecho pulse sequences of the lumbar spine were obtained without and with intravenous contrast. CONTRAST:  5 milliliters Gadavist COMPARISON:  Lumbar MRI 01/01/2019 and earlier. FINDINGS: Segmentation: Normal as seen on June CTs. Same numbering system as on 01/01/2019. Alignment: Stable L4 and L5 vertebral height and alignment. Stable visible T11 and T12 vertebral height and alignment. Straightening of lumbar lordosis from L1-L3, with mild retrolisthesis of L2, has not significantly changed. Vertebrae: Some interval erosion of the L1 anterior inferior endplate. Stable severe anterior compression of L2. Some erosion of the L3 anterior superior endplate. These areas demonstrate heterogeneous enhancement and anterior extraosseous extension of tumor. Additional  L1-L2 and L2-L3 level epidural tumor is described below. Posterior extension of tumor in the L2 pedicles, and new complete involvement of the left L2 posterior elements (series 6, image 11) since April. New subtotal involvement of the L2 spinous process. No L4 or L5 metastasis.  No T12 metastasis identified. Confluent right sacral ala and left posterior iliac metastases are partially visible and not significantly changed. SI joints remain intact. Conus medullaris and cauda equina: Conus extends to the L1 level. No lower spinal cord or conus signal abnormality. Capacious underlying spinal canal. No abnormal intradural enhancement. Paraspinal and other soft tissues: Abnormal paraspinal soft tissues centered at L2, further detailed below. Visible abdominal viscera remain within normal limits. Posterior paraspinal soft tissues are normal except for abnormal erector spinae muscle STIR signal which is probably denervation related. Disc levels: T12-L1:  Small left paracentral disc protrusion is stable.  L1-L2 and L2-L3: Circumferential epidural tumor greater on the left (series 9, image 12). All told, vertebral and nearly circumferential extraosseous extension of tumor here encompasses 78 by 62 by 67 millimeters (AP by transverse by CC). There is new moderate to severe malignant spinal and left lateral recess stenosis (series 9, image 12). There is complete tumor infiltration of the left L1 and L2 neural foramina. There is moderate infiltration of the right L2 neural foramina. The right L1 foramina appear spared at this time. L3-L4:  Negative. L4-L5:  Negative. L5-S1:  Negative. IMPRESSION: 1. Progressed and bulky spinal and paraspinal tumor now from L1 through L3. - this encompasses 78 x 62 x 67 mm. - subtotal involvement of the L2 vertebra has increased. - circumferential epidural tumor has substantially increased and results in moderate to severe malignant spinal and severe left lateral recess stenosis. See series 9,  image 12. - epidural tumor completely infiltrates the left L1 and L2 neural foramina, with moderate involvement of the right L2 foramina. - partial erosion of the anterior endplates of L1 and L3 since April. 2. Stable pathologic compression fracture of L2 with mild retropulsion. 3. Visible right sacral and left iliac bone metastases appear stable. 4. No new lumbar metastasis identified. Electronically Signed   By: Genevie Ann M.D.   On: 03/21/2019 10:04     ASSESSMENT/PLAN:  This is a very pleasant 63 year old Caucasian female with metastatic poorly differentiated malignant neoplasm of unknown primary at this point but according to the pathologist at the cancer center it is likely poorly differentiated non-small cell carcinoma of lung primary.  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 PDL 1 expression was 99%. The molecular studies by foundation 1 as well as [employer] showed no actionable mutations. The patient was a started on treatment with systemic chemotherapy with carboplatin for AUC of 5, Alimta 500 mg/M2 and Keytruda 200 mg IV every 3 weeks.  She is status post 4 cycles with partial response.  The patient was started on maintenance treatment with Alimta and Keytruda status post 13 cycles. This was discontinued to to evidence of disease progression with disease progression with epidural extension of her tumor in the lumbar spine. She is currently receiving palliative radiotherapy to this area under the care of Dr. Tammi Klippel. Her last radiation treatment is scheduled for 04/18/2019.   The patient was seen with Dr. Julien Nordmann today.  Dr. Julien Nordmann had a lengthy discussion today with the patient and her husband, was available by phone, about her current condition and treatment options.  She is was given the option of changing her palliative systemic chemotherapy to docetaxel and Cyramza vs. Chemotherapy with gemcitabine vs. A referral hospice/palliative care.  The  patient is interested in proceeding with treatment with docetaxel and Cyramza. She is expected to start her first dose of treatment on April 24, 2019.  We discussed with the patient the adverse effect of this treatment including but not limited to alopecia, myelosuppression, nausea and vomiting, peripheral neuropathy, liver or renal dysfunction as well as the adverse effect of Cyramza including increased risk for hemorrhage and GI perforation.  The patient was provided a handout with information regarding this treatment.   The patient will come back for a follow-up visit in 2 weeks for evaluation to manage any adverse side effects of treatment.  I have sent a prescription for 4 mg of Decadron to the patient's pharmacy.  The patient was instructed to  take 2 tablets twice a day the day before, the day of, and the day after chemotherapy.  The patient was recently taking 4 mg of Decadron 4 times a day for the swelling of the epidural expansion of the tumor.  She was instructed to start tapering her dose of Decadron daily. She was instructed to take 3 tablets by mouth for 1 week followed by 2 tablets by mouth for 1 week followed by 1 tablet by mouth for 1 week.   Regarding patient's progressive lower extremity weakness and need of extra assistance at home I will place referral to advance home [facility] for for assistance at home as well as physical therapy  I sent a refill for the patient's fentanyl patches 75 mcg/hr every 3 days in addition to oxycodone p.o. every 6 hours as needed for pain management.  She was advised to go immediately to the emergency department if she has any weakness of the lower extremities or incontinence to urine or bowel movement. For the bone disease, she will start the first dose of Xgeva 120 mg every 4 weeks today.  She has a recent dental evaluation and no issues were reported.  She was advised about the risk of osteonecrosis of the jaw.  She was also advised to increase her  calcium intake.  The patient was advised to call immediately if she has any concerning symptoms in the interval. The patient voices understanding of current disease status and treatment options and is in agreement with the current care plan. All questions were answered. The patient knows to call the clinic with any problems, questions or concerns. We can certainly see the patient much sooner if necessary   Orders Placed This Encounter  Procedures  . Total Protein, Urine dipstick    Standing Status:   Standing    Number of Occurrences:   10    Standing Expiration Date:   04/16/2020  . CBC with Differential (Cancer Center Only)    Standing Status:   Standing    Number of Occurrences:   20    Standing Expiration Date:   04/16/2020  . CMP ([facility] only)    Standing Status:   Standing    Number of Occurrences:   20    Standing Expiration Date:   04/16/2020  . Ambulatory referral to Home Health    Referral Priority:   Routine    Referral Type:   Home Health Care    Referral Reason:   Specialty Services Required    Requested Specialty:   [place]    Number of Visits Requested:   [place], PA-C 04/17/19  ADDENDUM: Hematology/Oncology Attending: I had a face-to-face encounter with the patient.  I recommended her care plan.  Her husband Dr. Drema Pry was available by phone during the visit.  This is a very pleasant 63 years old white female with metastatic non-small cell lung cancer, adenocarcinoma status post palliative radiotherapy several times to the metastatic bone disease in the neck as well as the lower back and hip.  The patient recently completed a course of palliative radiotherapy to the metastatic disease in the lumbar and sacral spine.  She is feeling much better and her pain is better controlled but she continues to have weakness in the lower extremities more on the left side.  The patient was also treated with systemic chemotherapy with induction  carboplatin, Alimta and Keytruda followed by maintenance treatment with Alimta and [facility]  status post 13 cycles.  She has been tolerating the treatment well except for the fatigue. Unfortunately with the persistent disease progression especially in the bone, I had a lengthy discussion with the patient and her husband today about her current treatment options.  We discussed several options including palliative care and hospice referral versus consideration of palliative systemic chemotherapy with docetaxel 75 mg/m2 as well as Cyramza 10 mg/KG every 3 weeks with Neulasta support versus treatment with single agent gemcitabine. After discussion of all the treatment options the patient would like to proceed with a treatment with docetaxel and Cyramza.  We discussed with her the adverse effect of this treatment and the patient was given handout about the medications. She is expected to start the first cycle of this treatment next week. We will see the patient back for follow-up visit in 2 weeks for evaluation and management of any adverse effect of her treatment. For the weakness in the lower extremities, will refer the patient to advance home care for consideration of physical therapy. She was advised to call immediately if she has any other concerning symptoms in the interval.  Disclaimer: This note was dictated with voice recognition software. Similar sounding words can inadvertently be transcribed and may be missed upon review. Eilleen Kempf, MD 04/18/19

## 2019-04-17 NOTE — Progress Notes (Signed)
Called home health referral to  [facility] spoke with [name].

## 2019-04-17 NOTE — Progress Notes (Signed)
DISCONTINUE ON PATHWAY REGIMEN - Non-Small Cell Lung     A cycle is every 21 days:     Pembrolizumab      Pemetrexed      Carboplatin   **Always confirm dose/schedule in your pharmacy ordering system**  REASON: Disease Progression PRIOR TREATMENT: [id number]: Pembrolizumab 200 mg + Pemetrexed 500 mg/m2 + Carboplatin AUC=5 q21 Days x 4-6 Cycles TREATMENT RESPONSE: Partial Response (PR)  START ON PATHWAY REGIMEN - Non-Small Cell Lung     A cycle is every 21 days:     Ramucirumab      Docetaxel   **Always confirm dose/schedule in your pharmacy ordering system**  Patient Characteristics: Stage IV Metastatic, Nonsquamous, Second Line - Chemotherapy/Immunotherapy, PS = 0, 1, No Prior PD-1/PD-L1  Inhibitor or Prior PD-1/PD-L1 Inhibitor + Chemotherapy, and Not a Candidate for Immunotherapy AJCC T Category: T3 Current Disease Status: Distant Metastases AJCC N Category: N2 AJCC M Category: M1c AJCC 8 Stage Grouping: IVB Histology: Nonsquamous Cell ROS1 Rearrangement Status: Negative T790M Mutation Status: Not Applicable - EGFR Mutation Negative/Unknown Other Mutations/Biomarkers: No Other Actionable Mutations NTRK Gene Fusion Status: Negative PD-L1 Expression Status: PD-L1 Positive ? 50% (TPS) Chemotherapy/Immunotherapy LOT: Second Line Chemotherapy/Immunotherapy Molecular Targeted Therapy: Not Appropriate ALK Rearrangement Status: Negative EGFR Mutation Status: Negative/Wild Type BRAF V600E Mutation Status: Negative ECOG Performance Status: 1 Immunotherapy Candidate Status: Not a Candidate for Immunotherapy Prior Immunotherapy Status: Prior PD-1/PD-L1 Inhibitor + Chemotherapy Intent of Therapy: Non-Curative / Palliative Intent, Discussed with Patient

## 2019-04-18 ENCOUNTER — Ambulatory Visit
Admission: RE | Admit: 2019-04-18 | Discharge: 2019-04-18 | Disposition: A | Discharge status: Home / Self Care | Payer: 59 | Source: Ambulatory Visit | Attending: Radiation Oncology | Admitting: Radiation Oncology

## 2019-04-18 ENCOUNTER — Telehealth: Payer: Self-pay | Admitting: *Deleted

## 2019-04-18 ENCOUNTER — Encounter: Payer: Self-pay | Admitting: Radiation Oncology

## 2019-04-18 ENCOUNTER — Other Ambulatory Visit: Payer: Self-pay

## 2019-04-18 DIAGNOSIS — Z79899 Other long term (current) drug therapy: Secondary | ICD-10-CM | POA: Diagnosis not present

## 2019-04-18 DIAGNOSIS — C7972 Secondary malignant neoplasm of left adrenal gland: Secondary | ICD-10-CM | POA: Diagnosis not present

## 2019-04-18 DIAGNOSIS — C7949 Secondary malignant neoplasm of other parts of nervous system: Secondary | ICD-10-CM | POA: Diagnosis not present

## 2019-04-18 DIAGNOSIS — C7951 Secondary malignant neoplasm of bone: Secondary | ICD-10-CM | POA: Diagnosis not present

## 2019-04-18 DIAGNOSIS — C801 Malignant (primary) neoplasm, unspecified: Secondary | ICD-10-CM | POA: Diagnosis not present

## 2019-04-18 DIAGNOSIS — Z5111 Encounter for antineoplastic chemotherapy: Secondary | ICD-10-CM | POA: Diagnosis not present

## 2019-04-18 DIAGNOSIS — Z5112 Encounter for antineoplastic immunotherapy: Secondary | ICD-10-CM | POA: Diagnosis not present

## 2019-04-18 NOTE — Telephone Encounter (Signed)
[facility] unable to fufill home health agency request (at capacity).    Faxed referral to [facility] [phone]

## 2019-04-18 NOTE — Telephone Encounter (Signed)
Received call from [facility].  They are not in network with [facility] patients insurance.  She advised that according to the denial they were told that [place], [place], [place].   Faxed referral to [facility] [phone]

## 2019-04-19 ENCOUNTER — Telehealth: Payer: Self-pay | Admitting: Internal Medicine

## 2019-04-19 NOTE — Telephone Encounter (Signed)
Scheduled appt per 8/05 los - pt aware of appts being added and will check my chart

## 2019-04-22 ENCOUNTER — Telehealth: Payer: Self-pay | Admitting: *Deleted

## 2019-04-22 NOTE — Telephone Encounter (Signed)
Pt called [facility] states " I spoke with my insurance, the patch on the arm for my WBC counts is approved. Also, do I need to continue Folic Acid with the new treatment and what is the status of home health coming out to help me?" Discussed with pt , Onpro is part of her treatment plan, she will not need to continue taking Folic Acid for the new treatments she will receive.  Referral to Home health made on 8/5, call placed to [facility] with [facility] [phone]. They will look into assisting pt and call back with additional information. Notified pt of above, pt thanked me for call, no further concerns.

## 2019-04-22 NOTE — Telephone Encounter (Signed)
Referral was faxed to [facility]. Since no response was received as of today, I called and spoke with [name].  She stated that they could take the patient on under [place] but after they ran insurance quotes they found that the patient had only met $2,800 of deductible as of Friday, still had $8,000 out of pocket to cover and then would have to pay 40% of co insurance.  Reviewed findings with patient, she declined proceeding with home health referral at this time due to cost.  This was the 4th agency I attempted to gain services with on behalf of the patient.  Encouraged her to reach out to her case manager with [facility] to confirm findings provided by [facility] and to figure out her plan benefits if she should need to proceed with Home health referral in the future.    MD notified.

## 2019-04-24 ENCOUNTER — Telehealth: Payer: Self-pay | Admitting: *Deleted

## 2019-04-24 NOTE — Telephone Encounter (Signed)
Received call from patient regarding the claritin she is to take with her new treatment regime. Reviewed the reason for the medication and when to start it (tomorrow-the day of her treatment) and to take it for 5-7 days to help with side effects from the Neulasta/OnPro she will get tomorrow.  Pt voiced understanding.

## 2019-04-24 NOTE — Telephone Encounter (Signed)
Received call from patient inquiring about her home health referral for PT.  Advised that Armanda Magic, RN had called the contact person for [facility] on 04/22/19 and that bthe referral was placed on 04/17/19. Advised that I would call thema gain to check on the status of the referral  TCT [phone] and spoke with Butch Penny (covering for [facility]). She states she will look into the status of the referral and call back.  Provided # for her to return  Call [phone]

## 2019-04-25 ENCOUNTER — Other Ambulatory Visit: Payer: Self-pay

## 2019-04-25 ENCOUNTER — Inpatient Hospital Stay: Payer: 59

## 2019-04-25 ENCOUNTER — Ambulatory Visit: Payer: 59 | Admitting: Internal Medicine

## 2019-04-25 VITALS — BP 123/64 | HR 91 | Temp 98.6°F | Resp 18

## 2019-04-25 DIAGNOSIS — C7951 Secondary malignant neoplasm of bone: Secondary | ICD-10-CM | POA: Diagnosis not present

## 2019-04-25 DIAGNOSIS — C7949 Secondary malignant neoplasm of other parts of nervous system: Secondary | ICD-10-CM | POA: Diagnosis not present

## 2019-04-25 DIAGNOSIS — C3411 Malignant neoplasm of upper lobe, right bronchus or lung: Secondary | ICD-10-CM

## 2019-04-25 DIAGNOSIS — Z5112 Encounter for antineoplastic immunotherapy: Secondary | ICD-10-CM | POA: Diagnosis not present

## 2019-04-25 DIAGNOSIS — C801 Malignant (primary) neoplasm, unspecified: Secondary | ICD-10-CM | POA: Diagnosis not present

## 2019-04-25 DIAGNOSIS — C7972 Secondary malignant neoplasm of left adrenal gland: Secondary | ICD-10-CM | POA: Diagnosis not present

## 2019-04-25 DIAGNOSIS — Z5111 Encounter for antineoplastic chemotherapy: Secondary | ICD-10-CM | POA: Diagnosis not present

## 2019-04-25 DIAGNOSIS — C799 Secondary malignant neoplasm of unspecified site: Secondary | ICD-10-CM

## 2019-04-25 DIAGNOSIS — Z79899 Other long term (current) drug therapy: Secondary | ICD-10-CM | POA: Diagnosis not present

## 2019-04-25 LAB — CMP (CANCER CENTER ONLY)
ALT: 148 U/L — ABNORMAL HIGH (ref 0–44)
AST: 58 U/L — ABNORMAL HIGH (ref 15–41)
Albumin: 2.6 g/dL — ABNORMAL LOW (ref 3.5–5.0)
Alkaline Phosphatase: 179 U/L — ABNORMAL HIGH (ref 38–126)
Anion gap: 10 (ref 5–15)
BUN: 20 mg/dL (ref 8–23)
CO2: 24 mmol/L (ref 22–32)
Calcium: 9.1 mg/dL (ref 8.9–10.3)
Chloride: 103 mmol/L (ref 98–111)
Creatinine: 0.65 mg/dL (ref 0.44–1.00)
GFR, Est AFR Am: >60 mL/min (ref >60)
GFR, Estimated: >60 mL/min (ref >60)
Glucose, Bld: 69 mg/dL — ABNORMAL LOW (ref 70–99)
Potassium: 4.3 mmol/L (ref 3.5–5.1)
Sodium: 137 mmol/L (ref 135–145)
Total Bilirubin: <0.2 mg/dL — ABNORMAL LOW (ref 0.3–1.2)
Total Protein: 6.7 g/dL (ref 6.5–8.1)

## 2019-04-25 LAB — CBC WITH DIFFERENTIAL (CANCER CENTER ONLY)
Abs Immature Granulocytes: 1.55 10*3/uL — ABNORMAL HIGH (ref 0.00–0.07)
Basophils Absolute: 0.2 10*3/uL — ABNORMAL HIGH (ref 0.0–0.1)
Basophils Relative: 1 %
Eosinophils Absolute: 0 10*3/uL (ref 0.0–0.5)
Eosinophils Relative: 0 %
HCT: 34.2 % — ABNORMAL LOW (ref 36.0–46.0)
Hemoglobin: 10.7 g/dL — ABNORMAL LOW (ref 12.0–15.0)
Immature Granulocytes: 6 %
Lymphocytes Relative: 0 %
Lymphs Abs: 0.1 10*3/uL — ABNORMAL LOW (ref 0.7–4.0)
MCH: 30.7 pg (ref 26.0–34.0)
MCHC: 31.3 g/dL (ref 30.0–36.0)
MCV: 98 fL (ref 80.0–100.0)
Monocytes Absolute: 0.9 10*3/uL (ref 0.1–1.0)
Monocytes Relative: 3 %
Neutro Abs: 25.2 10*3/uL — ABNORMAL HIGH (ref 1.7–7.7)
Neutrophils Relative %: 90 %
Platelet Count: 170 10*3/uL (ref 150–400)
RBC: 3.49 MIL/uL — ABNORMAL LOW (ref 3.87–5.11)
RDW: 17.2 % — ABNORMAL HIGH (ref 11.5–15.5)
WBC Count: 27.8 10*3/uL — ABNORMAL HIGH (ref 4.0–10.5)
nRBC: 0 % (ref 0.0–0.2)

## 2019-04-25 MED ORDER — DIPHENHYDRAMINE HCL 50 MG/ML IJ SOLN
INTRAMUSCULAR | Status: AC
  Filled 2019-04-25: qty 1

## 2019-04-25 MED ORDER — SODIUM CHLORIDE 0.9% FLUSH
10.0000 mL | INTRAVENOUS | Status: DC | PRN
  Filled 2019-04-25: qty 10

## 2019-04-25 MED ORDER — ACETAMINOPHEN 325 MG PO TABS
ORAL_TABLET | ORAL | Status: AC
  Filled 2019-04-25: qty 2

## 2019-04-25 MED ORDER — HEPARIN SOD (PORK) LOCK FLUSH 100 UNIT/ML IV SOLN
500.0000 [IU] | Freq: Once | INTRAVENOUS | Status: DC | PRN
  Filled 2019-04-25: qty 5

## 2019-04-25 MED ORDER — PEGFILGRASTIM 6 MG/0.6ML ~~LOC~~ PSKT
6.0000 mg | PREFILLED_SYRINGE | Freq: Once | SUBCUTANEOUS | Status: AC
  Administered 2019-04-25: 17:00:00 6 mg via SUBCUTANEOUS

## 2019-04-25 MED ORDER — DEXAMETHASONE SODIUM PHOSPHATE 10 MG/ML IJ SOLN
10.0000 mg | Freq: Once | INTRAMUSCULAR | Status: AC
  Administered 2019-04-25: 13:00:00 10 mg via INTRAVENOUS

## 2019-04-25 MED ORDER — SODIUM CHLORIDE 0.9 % IV SOLN
10.0000 mg/kg | Freq: Once | INTRAVENOUS | Status: AC
  Administered 2019-04-25: 500 mg via INTRAVENOUS
  Filled 2019-04-25: qty 50

## 2019-04-25 MED ORDER — ACETAMINOPHEN 325 MG PO TABS
650.0000 mg | ORAL_TABLET | Freq: Once | ORAL | Status: AC
  Administered 2019-04-25: 13:00:00 650 mg via ORAL

## 2019-04-25 MED ORDER — DEXAMETHASONE SODIUM PHOSPHATE 10 MG/ML IJ SOLN
INTRAMUSCULAR | Status: AC
  Filled 2019-04-25: qty 1

## 2019-04-25 MED ORDER — ALTEPLASE 2 MG IJ SOLR
2.0000 mg | Freq: Once | INTRAMUSCULAR | Status: AC | PRN
  Administered 2019-04-25: 2 mg
  Filled 2019-04-25: qty 2

## 2019-04-25 MED ORDER — ALTEPLASE 2 MG IJ SOLR
INTRAMUSCULAR | Status: AC
  Filled 2019-04-25: qty 2

## 2019-04-25 MED ORDER — SODIUM CHLORIDE 0.9 % IV SOLN
Freq: Once | INTRAVENOUS | Status: AC
  Administered 2019-04-25: 13:00:00 via INTRAVENOUS
  Filled 2019-04-25: qty 250

## 2019-04-25 MED ORDER — DIPHENHYDRAMINE HCL 50 MG/ML IJ SOLN
50.0000 mg | Freq: Once | INTRAMUSCULAR | Status: AC
  Administered 2019-04-25: 13:00:00 50 mg via INTRAVENOUS

## 2019-04-25 MED ORDER — SODIUM CHLORIDE 0.9 % IV SOLN: 10.0000 mg | Freq: Once | INTRAVENOUS | Status: DC

## 2019-04-25 MED ORDER — SODIUM CHLORIDE 0.9% FLUSH
10.0000 mL | INTRAVENOUS | Status: DC | PRN
  Administered 2019-04-25: 10 mL
  Filled 2019-04-25: qty 10

## 2019-04-25 MED ORDER — SODIUM CHLORIDE 0.9 % IV SOLN
75.0000 mg/m2 | Freq: Once | INTRAVENOUS | Status: AC
  Administered 2019-04-25: 15:00:00 120 mg via INTRAVENOUS
  Filled 2019-04-25: qty 12

## 2019-04-25 MED ORDER — PEGFILGRASTIM 6 MG/0.6ML ~~LOC~~ PSKT
PREFILLED_SYRINGE | SUBCUTANEOUS | Status: AC
  Filled 2019-04-25: qty 0.6

## 2019-04-25 NOTE — Progress Notes (Signed)
Attempted to drawn blood twice and was unsuccessful.

## 2019-04-25 NOTE — Patient Instructions (Signed)
Docetaxel injection What is this medicine? DOCETAXEL (doe se TAX el) is a chemotherapy drug. It targets fast dividing cells, like cancer cells, and causes these cells to die. This medicine is used to treat many types of cancers like breast cancer, certain stomach cancers, head and neck cancer, lung cancer, and prostate cancer. This medicine may be used for other purposes; ask your health care provider or pharmacist if you have questions. COMMON BRAND NAME(S): Docefrez, Taxotere What should I tell my health care provider before I take this medicine? They need to know if you have any of these conditions:  infection (especially a virus infection such as chickenpox, cold sores, or herpes)  liver disease  low blood counts, like low white cell, platelet, or red cell counts  an unusual or allergic reaction to docetaxel, polysorbate 80, other chemotherapy agents, other medicines, foods, dyes, or preservatives  pregnant or trying to get pregnant  breast-feeding How should I use this medicine? This drug is given as an infusion into a vein. It is administered in a hospital or clinic by a specially trained health care professional. Talk to your pediatrician regarding the use of this medicine in children. Special care may be needed. Overdosage: If you think you have taken too much of this medicine contact a poison control center or emergency room at once. NOTE: This medicine is only for you. Do not share this medicine with others. What if I miss a dose? It is important not to miss your dose. Call your doctor or health care professional if you are unable to keep an appointment. What may interact with this medicine?  aprepitant  certain antibiotics like erythromycin or clarithromycin  certain antivirals for HIV or hepatitis  certain medicines for fungal infections like fluconazole, itraconazole, ketoconazole, posaconazole, or  voriconazole  cimetidine  ciprofloxacin  conivaptan  cyclosporine  dronedarone  fluvoxamine  grapefruit juice  imatinib  verapamil This list may not describe all possible interactions. Give your health care provider a list of all the medicines, herbs, non-prescription drugs, or dietary supplements you use. Also tell them if you smoke, drink alcohol, or use illegal drugs. Some items may interact with your medicine. What should I watch for while using this medicine? Your condition will be monitored carefully while you are receiving this medicine. You will need important blood work done while you are taking this medicine. Call your doctor or health care professional for advice if you get a fever, chills or sore throat, or other symptoms of a cold or flu. Do not treat yourself. This drug decreases your body's ability to fight infections. Try to avoid being around people who are sick. Some products may contain alcohol. Ask your health care professional if this medicine contains alcohol. Be sure to tell all health care professionals you are taking this medicine. Certain medicines, like metronidazole and disulfiram, can cause an unpleasant reaction when taken with alcohol. The reaction includes flushing, headache, nausea, vomiting, sweating, and increased thirst. The reaction can last from 30 minutes to several hours. You may get drowsy or dizzy. Do not drive, use machinery, or do anything that needs mental alertness until you know how this medicine affects you. Do not stand or sit up quickly, especially if you are an older patient. This reduces the risk of dizzy or fainting spells. Alcohol may interfere with the effect of this medicine. Talk to your health care professional about your risk of cancer. You may be more at risk for certain types of cancer if  you take this medicine. Do not become pregnant while taking this medicine or for 6 months after stopping it. Women should inform their doctor if  they wish to become pregnant or think they might be pregnant. There is a potential for serious side effects to an unborn child. Talk to your health care professional or pharmacist for more information. Do not breast-feed an infant while taking this medicine or for 1 to 2 weeks after stopping it. Males who get this medicine must use a condom during sex with females who can get pregnant. If you get a woman pregnant, the baby could have birth defects. The baby could die before they are born. You will need to continue wearing a condom for 3 months after stopping the medicine. Tell your health care provider right away if your partner becomes pregnant while you are taking this medicine. This may interfere with the ability to father a child. You should talk to your doctor or health care professional if you are concerned about your fertility. What side effects may I notice from receiving this medicine? Side effects that you should report to your doctor or health care professional as soon as possible:  allergic reactions like skin rash, itching or hives, swelling of the face, lips, or tongue  blurred vision  breathing problems  changes in vision  low blood counts - This drug may decrease the number of white blood cells, red blood cells and platelets. You may be at increased risk for infections and bleeding.  nausea and vomiting  pain, redness or irritation at site where injected  pain, tingling, numbness in the hands or feet  redness, blistering, peeling, or loosening of the skin, including inside the mouth  signs of decreased platelets or bleeding - bruising, pinpoint red spots on the skin, black, tarry stools, nosebleeds  signs of decreased red blood cells - unusually weak or tired, fainting spells, lightheadedness  signs of infection - fever or chills, cough, sore throat, pain or difficulty passing urine  swelling of the ankle, feet, hands Side effects that usually do not require medical  attention (report to your doctor or health care professional if they continue or are bothersome):  constipation  diarrhea  fingernail or toenail changes  hair loss  loss of appetite  mouth sores  muscle pain This list may not describe all possible side effects. Call your doctor for medical advice about side effects. You may report side effects to FDA at 1-800-FDA-1088. Where should I keep my medicine? This drug is given in a hospital or clinic and will not be stored at home. NOTE: This sheet is a summary. It may not cover all possible information. If you have questions about this medicine, talk to your doctor, pharmacist, or health care provider.  2020 [employer]/Gold Standard (2018-11-02 12:23:11)   Ramucirumab injection What is this medicine? RAMUCIRUMAB (ra mue SIR ue mab) is a monoclonal antibody. It is used to treat stomach cancer, colorectal cancer, liver cancer, and lung cancer. This medicine may be used for other purposes; ask your health care provider or pharmacist if you have questions. COMMON BRAND NAME(S): Cyramza What should I tell my health care provider before I take this medicine? They need to know if you have any of these conditions:  bleeding disorders  blood clots  heart disease, including heart failure, heart attack, or chest pain (angina)  high blood pressure  infection (especially a virus infection such as chickenpox, cold sores, or herpes)  protein in your urine  recent or planning to have surgery  stroke  an unusual or allergic reaction to ramucirumab, other medicines, foods, dyes, or preservatives  pregnant or trying to get pregnant  breast-feeding How should I use this medicine? This medicine is for infusion into a vein. It is given by a health care professional in a hospital or clinic setting. Talk to your pediatrician regarding the use of this medicine in children. Special care may be needed. Overdosage: If you think you have taken too  much of this medicine contact a poison control center or emergency room at once. NOTE: This medicine is only for you. Do not share this medicine with others. What if I miss a dose? It is important not to miss your dose. Call your doctor or health care professional if you are unable to keep an appointment. What may interact with this medicine? Interactions have not been studied. This list may not describe all possible interactions. Give your health care provider a list of all the medicines, herbs, non-prescription drugs, or dietary supplements you use. Also tell them if you smoke, drink alcohol, or use illegal drugs. Some items may interact with your medicine. What should I watch for while using this medicine? Your condition will be monitored carefully while you are receiving this medicine. You will need to to check your blood pressure and have your blood and urine tested while you are taking this medicine. Your condition will be monitored carefully while you are receiving this medicine. This medicine may increase your risk to bruise or bleed. Call your doctor or health care professional if you notice any unusual bleeding. This medicine may rarely cause 'gastrointestinal perforation' (holes in the stomach, intestines or colon), a serious side effect requiring surgery to repair. This medicine should be started at least 2 weeks following major surgery and the site of the surgery should be totally healed. Check with your doctor before scheduling dental work or surgery while you are receiving this treatment. Talk to your doctor if you have recently had surgery or if you have a wound that has not healed. Do not become pregnant while taking this medicine or for 3 months after stopping it. Women should inform their doctor if they wish to become pregnant or think they might be pregnant. There is a potential for serious side effects to an unborn child. Talk to your health care professional or pharmacist for more  information. Do not breast-feed an infant while taking this medicine or for 2 months after stopping it. This medicine may interfere with the ability to have a child. Talk with your doctor or health care professional if you are concerned about your fertility. What side effects may I notice from receiving this medicine? Side effects that you should report to your doctor or health care professional as soon as possible:  allergic reactions like skin rash, itching or hives, breathing problems, swelling of the face, lips, or tongue  signs of infection - fever or chills, cough, sore throat  chest pain or chest tightness  confusion  dizziness  feeling faint or lightheaded, falls  severe abdominal pain  severe nausea, vomiting  signs and symptoms of bleeding such as bloody or black, tarry stools; red or dark-brown urine; spitting up blood or brown material that looks like coffee grounds; red spots on the skin; unusual bruising or bleeding from the eye, gums, or nose  signs and symptoms of a blood clot such as breathing problems; changes in vision; chest pain; severe, sudden headache; pain, swelling,  warmth in the leg; trouble speaking; sudden numbness or weakness of the face, arm or leg  symptoms of a stroke: change in mental awareness, inability to talk or move one side of the body  trouble walking, dizziness, loss of balance or coordination Side effects that usually do not require medical attention (report to your doctor or health care professional if they continue or are bothersome):  cold, clammy skin  constipation  diarrhea  headache  nausea, vomiting  stomach pain  unusually slow heartbeat  unusually weak or tired This list may not describe all possible side effects. Call your doctor for medical advice about side effects. You may report side effects to FDA at 1-800-FDA-1088. Where should I keep my medicine? This drug is given in a hospital or clinic and will not be stored  at home. NOTE: This sheet is a summary. It may not cover all possible information. If you have questions about this medicine, talk to your doctor, pharmacist, or health care provider.  2020 [employer]/Gold Standard (2018-11-05 11:44:01)

## 2019-04-25 NOTE — Progress Notes (Signed)
Cbc and cmet reviewed by MD, ok to treat despite labs.

## 2019-04-26 ENCOUNTER — Telehealth: Payer: Self-pay | Admitting: *Deleted

## 2019-04-26 ENCOUNTER — Other Ambulatory Visit: Payer: Self-pay | Admitting: Physician Assistant

## 2019-04-26 DIAGNOSIS — B370 Candidal stomatitis: Secondary | ICD-10-CM

## 2019-04-26 MED ORDER — FLUCONAZOLE 100 MG PO TABS: 100.0000 mg | ORAL_TABLET | Freq: Every day | ORAL | 0 refills | Status: DC

## 2019-04-26 MED FILL — FLUCONAZOLE 100 MG TAB: 100 | 7 days supply | Qty: 7 | Fill #0

## 2019-04-26 NOTE — Telephone Encounter (Signed)
Received vm message from patient stating that her mouth and throat are very sore and her tongue has a white coating on it  She is asking for something to take for this. Advised Cassie Heilingoetter, PA. She is ordering Difucan for this per pt request. Pt made aware of this. Also spoke to patient regarding home health services for PT.  Advised that we had tried 4 different agencies and all denied services either for lack of staffing or out of network and they don't accept [facility]. Pt asked if she could be put on a waiting list. Advised that I would contact [facility] about this possibility and get back with her next week.  She is good with this. No other questions or concerns

## 2019-04-27 ENCOUNTER — Inpatient Hospital Stay: Payer: 59

## 2019-04-27 MED FILL — DEXAMETHASONE 4 MG TABLET: 4 | 62 days supply | Qty: 36 | Fill #0

## 2019-05-02 ENCOUNTER — Other Ambulatory Visit: Payer: Self-pay

## 2019-05-02 ENCOUNTER — Inpatient Hospital Stay: Payer: 59

## 2019-05-02 ENCOUNTER — Encounter: Payer: Self-pay | Admitting: Physician Assistant

## 2019-05-02 ENCOUNTER — Inpatient Hospital Stay (HOSPITAL_BASED_OUTPATIENT_CLINIC_OR_DEPARTMENT_OTHER): Payer: 59 | Admitting: Physician Assistant

## 2019-05-02 DIAGNOSIS — Z79899 Other long term (current) drug therapy: Secondary | ICD-10-CM | POA: Diagnosis not present

## 2019-05-02 DIAGNOSIS — G893 Neoplasm related pain (acute) (chronic): Secondary | ICD-10-CM

## 2019-05-02 DIAGNOSIS — C3411 Malignant neoplasm of upper lobe, right bronchus or lung: Secondary | ICD-10-CM

## 2019-05-02 DIAGNOSIS — C7972 Secondary malignant neoplasm of left adrenal gland: Secondary | ICD-10-CM | POA: Diagnosis not present

## 2019-05-02 DIAGNOSIS — C801 Malignant (primary) neoplasm, unspecified: Secondary | ICD-10-CM | POA: Diagnosis not present

## 2019-05-02 DIAGNOSIS — C7949 Secondary malignant neoplasm of other parts of nervous system: Secondary | ICD-10-CM | POA: Diagnosis not present

## 2019-05-02 DIAGNOSIS — C799 Secondary malignant neoplasm of unspecified site: Secondary | ICD-10-CM

## 2019-05-02 DIAGNOSIS — Z5111 Encounter for antineoplastic chemotherapy: Secondary | ICD-10-CM | POA: Diagnosis not present

## 2019-05-02 DIAGNOSIS — Z5112 Encounter for antineoplastic immunotherapy: Secondary | ICD-10-CM | POA: Diagnosis not present

## 2019-05-02 DIAGNOSIS — C7951 Secondary malignant neoplasm of bone: Secondary | ICD-10-CM | POA: Diagnosis not present

## 2019-05-02 LAB — CBC WITH DIFFERENTIAL (CANCER CENTER ONLY)
Abs Immature Granulocytes: 0.1 10*3/uL — ABNORMAL HIGH (ref 0.00–0.07)
Band Neutrophils: 25 %
Basophils Absolute: 0 10*3/uL (ref 0.0–0.1)
Basophils Relative: 0 %
Eosinophils Absolute: 0 10*3/uL (ref 0.0–0.5)
Eosinophils Relative: 0 %
HCT: 30.4 % — ABNORMAL LOW (ref 36.0–46.0)
Hemoglobin: 9.5 g/dL — ABNORMAL LOW (ref 12.0–15.0)
Lymphocytes Relative: 1 %
Lymphs Abs: 0.1 10*3/uL — ABNORMAL LOW (ref 0.7–4.0)
MCH: 30.3 pg (ref 26.0–34.0)
MCHC: 31.3 g/dL (ref 30.0–36.0)
MCV: 96.8 fL (ref 80.0–100.0)
Metamyelocytes Relative: 1 %
Monocytes Absolute: 0.9 10*3/uL (ref 0.1–1.0)
Monocytes Relative: 7 %
Neutro Abs: 11.6 10*3/uL (ref 1.7–17.7)
Neutrophils Relative %: 66 %
Platelet Count: 65 10*3/uL — ABNORMAL LOW (ref 150–400)
RBC: 3.14 MIL/uL — ABNORMAL LOW (ref 3.87–5.11)
RDW: 16.8 % — ABNORMAL HIGH (ref 11.5–15.5)
WBC Count: 12.8 10*3/uL — ABNORMAL HIGH (ref 4.0–10.5)
nRBC: 0.4 % — ABNORMAL HIGH (ref 0.0–0.2)

## 2019-05-02 LAB — CMP (CANCER CENTER ONLY)
ALT: 171 U/L — ABNORMAL HIGH (ref 0–44)
AST: 69 U/L — ABNORMAL HIGH (ref 15–41)
Albumin: 2.7 g/dL — ABNORMAL LOW (ref 3.5–5.0)
Alkaline Phosphatase: 172 U/L — ABNORMAL HIGH (ref 38–126)
Anion gap: 9 (ref 5–15)
BUN: 24 mg/dL — ABNORMAL HIGH (ref 8–23)
CO2: 24 mmol/L (ref 22–32)
Calcium: 8.4 mg/dL — ABNORMAL LOW (ref 8.9–10.3)
Chloride: 103 mmol/L (ref 98–111)
Creatinine: 0.7 mg/dL (ref 0.44–1.00)
GFR, Est AFR Am: >60 mL/min (ref >60)
GFR, Estimated: >60 mL/min (ref >60)
Glucose, Bld: 136 mg/dL — ABNORMAL HIGH (ref 70–99)
Potassium: 4.9 mmol/L (ref 3.5–5.1)
Sodium: 136 mmol/L (ref 135–145)
Total Bilirubin: 0.2 mg/dL — ABNORMAL LOW (ref 0.3–1.2)
Total Protein: 5.7 g/dL — ABNORMAL LOW (ref 6.5–8.1)

## 2019-05-02 LAB — TOTAL PROTEIN, URINE DIPSTICK: Protein, ur: <30 mg/dL — AB

## 2019-05-02 MED ORDER — FENTANYL 75 MCG/HR TD PT72: 1.0000 | MEDICATED_PATCH | TRANSDERMAL | 0 refills | Status: DC

## 2019-05-02 MED ORDER — OXYCODONE HCL 5 MG PO TABS: 5.0000 mg | ORAL_TABLET | Freq: Four times a day (QID) | ORAL | 0 refills | Status: DC | PRN

## 2019-05-02 MED ORDER — HEPARIN SOD (PORK) LOCK FLUSH 100 UNIT/ML IV SOLN
500.0000 [IU] | Freq: Once | INTRAVENOUS | Status: AC | PRN
  Administered 2019-05-02: 500 [IU]
  Filled 2019-05-02: qty 5

## 2019-05-02 MED ORDER — GABAPENTIN 300 MG PO CAPS: 300.0000 mg | ORAL_CAPSULE | Freq: Three times a day (TID) | ORAL | 0 refills | Status: DC

## 2019-05-02 MED ORDER — SODIUM CHLORIDE 0.9% FLUSH
10.0000 mL | INTRAVENOUS | Status: DC | PRN
  Administered 2019-05-02: 10 mL
  Filled 2019-05-02: qty 10

## 2019-05-02 MED ORDER — LINACLOTIDE 72 MCG PO CAPS: ORAL_CAPSULE | ORAL | 0 refills | Status: DC

## 2019-05-02 MED FILL — fentaNYL 75 MCG/HR PT72: 75 | 15 days supply | Qty: 5 | Fill #0

## 2019-05-02 MED FILL — oxyCODONE HCL 5 MG TABS: 5 | 15 days supply | Qty: 60 | Fill #0

## 2019-05-02 MED FILL — LINZESS 72 MCG CAPSULE: 72 | 30 days supply | Qty: 30 | Fill #0

## 2019-05-02 NOTE — Patient Instructions (Signed)

## 2019-05-02 NOTE — Progress Notes (Signed)
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 There is also epidural invasion into L1-L3.Metastatic sarcoma cannot be also excluded at this point.  Molecular studies by foundation 1showed no actionable mutations. PDL 1 expression 99%  PRIOR THERAPY: 1) Palliative radiotherapy to the neck mass and hip lesion under the care of Dr. Tammi Klippel  2) palliative radiotherapy to the left adrenal gland and periaortic lymph node under the care of Dr. Tammi Klippel. 3) Palliative radiotherapy to the lumbar metastasis under the care of Dr. Tammi Klippel. Completed on 04/18/2019. 4) Systemic chemotherapy with carboplatin for AUC of 5, Alimta 500 mg/M2 and Keytruda 200 mg IV every 3 weeks. Status post 17 cycles. Starting from cycle #5 the patient was on maintenance treatment with Alimta and Keytruda. Last dose on 03/14/2019  CURRENT THERAPY: Palliative systemic chemotherapy with Docetaxel 75 mg/m2 and Cyramza 10 mg/kg IV every 3 weeks with neulasta support. First dose expected on 04/24/2019. Status post 1 cycle.   INTERVAL HISTORY: Rebecca Lucas 63 y.o. female returns to the clinic for a follow up visit. The patient had her first cycle of chemotherapy last week with Docetaxel and Cyramza with neulasta support. She tolerated it well without any adverse side effects. She states that she "wouldn't even have known that she had chemotherapy". She states she required a little bit more assistance for two days following treatment but no other side effects. She denies any fevers, chills, or weight loss. She occasionally has night sweats but states that this has been improving over the last few weeks. She denies any chest pain, shortness of  breath, cough, or hemoptyosis. She denies any nausa, vomiting, or diarrhea. She has been experiencing constipation for which she takes Linzess. She denies any headaches or visual changes. She denies any bleeding or bruising including hematuria, melena, epistaxis, hematochezia, or gingival bleeding. She denies any headaches or visual changes. She did develop thrush a few days following treatment which was managed with diflucan. She denies any oral lesions or anymore tongue coating or odynophagia. She notes some gingival sensitivity though.   Regarding the epidural tumor extension near L1-L3, the patient states that her pain is improving. She had received palliative radiotherapy to this region. She is also prescribed fentanyl patches (75 mcg), oxycodone, and gabapentin. The patient states that she uses her fentanyl patches and 2 ibuprofen to manage the pain. She only uses 1 tablet of oxycodone of breakthrough pain which she only needs once every couple of days.  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  Home health/PT was ordered to provide assistance and exercises at her home; however, there was some challenges with the patient needing to pay too much out of pocket for this service. The patient opted out of this service at this time. Her husband and family have been an excellent support system and they have been helping her perform exercises at home. She states that her weakness is improving and she is regaining some sensation back in her left lower leg. She is currently tapering her decadron which was prescribed to decrease the swelling around the tumor in the spine. The patient also mentioned left lower extremity swelling in the foot/ankle. This has been  present for several weeks. She states that the swelling almost completely resolves with elevation of her leg and is worse by the end of the day. She denies any calf  pain. The patient is here today for evaluation and to manage any adverse side effects following her first cycle of docetaxel and cyramza.   MEDICAL HISTORY:No past medical history on file.  ALLERGIES:  has No Known Allergies.  MEDICATIONS:  Current Outpatient Medications  Medication Sig Dispense Refill  . acetaminophen (TYLENOL) 500 MG tablet Take 1,000 mg by mouth every 6 (six) hours as needed for mild pain.     Marland Kitchen alendronate (FOSAMAX) 70 MG tablet Take 70 mg by mouth once a week. Wednesday  3  . butalbital-aspirin-caffeine (FIORINAL) 50-325-40 MG capsule Take 1 capsule by mouth every 6 (six) hours as needed for migraine.   1  . Calcium Carbonate-Vitamin D (CALCIUM-D PO) Take 1 tablet by mouth 2 (two) times daily.    Marland Kitchen dexamethasone (DECADRON) 4 MG tablet Take 2 tablets twice a day (4 tablets total per day) the day before, the day of, and the day after chemotherapy 40 tablet 2  . fentaNYL (DURAGESIC) 75 MCG/HR Place 1 patch onto the skin every 3 (three) days for 15 days. 5 patch 0  . fluconazole (DIFLUCAN) 100 MG tablet Take 1 tablet (100 mg total) by mouth daily. 7 tablet 0  . gabapentin (NEURONTIN) 300 MG capsule Take 1 capsule (300 mg total) by mouth 3 (three) times daily. 90 capsule 0  . ibuprofen (ADVIL,MOTRIN) 200 MG tablet Take 600 mg by mouth every 6 (six) hours as needed for mild pain.     Marland Kitchen lidocaine-prilocaine (EMLA) cream Apply 1 application topically as needed. 30 g 0  . linaclotide (LINZESS) 72 MCG capsule TAKE 1 CAPSULE BY MOUTH ONCE DAILY BEFORE BREAKFAST 30 capsule 0  . LUTEIN PO Take 1 tablet by mouth daily.    Marland Kitchen omeprazole (PRILOSEC) 20 MG capsule TAKE 1 CAPSULE (20 MG TOTAL) BY MOUTH DAILY. 90 capsule 1  . ondansetron (ZOFRAN) 8 MG tablet TAKE 1 TABLET (8 MG TOTAL) BY MOUTH EVERY 8 HOURS AS NEEDED FOR NAUSEA OR VOMITING. 20 tablet 0  . oxyCODONE (OXY IR/ROXICODONE) 5 MG immediate release tablet Take 1 tablet (5 mg total) by mouth every 6 (six) hours as needed for severe  pain. 60 tablet 0  . traMADol (ULTRAM) 50 MG tablet Take 1 tablet (50 mg total) by mouth every 6 (six) hours as needed for moderate pain. 120 tablet 0  . zolpidem (AMBIEN) 10 MG tablet Take 1 tablet (10 mg total) by mouth at bedtime as needed for sleep. 30 tablet 0   No current facility-administered medications for this visit.     SURGICAL HISTORY:  Past Surgical History:  Procedure Laterality Date  . IR IMAGING GUIDED PORT INSERTION  06/26/2018    REVIEW OF SYSTEMS:   Review of Systems  Constitutional: Negative for appetite change, chills, fatigue, fever and unexpected weight change.  HENT: Positive for odynophagia secondary to thrush (resolved). Negative for mouth sores, nosebleeds, oral lesions and gingival bleeding.  Eyes: Negative for eye problems and icterus.  Respiratory: Negative for cough, hemoptysis, shortness of breath and wheezing.   Cardiovascular: Positive for left leg foot/ankle swelling. Negative for chest pain  Gastrointestinal: Positive for constipation. Negative for abdominal pain, diarrhea, nausea and vomiting.  Genitourinary: Negative for bladder incontinence, difficulty urinating, dysuria, frequency and hematuria.   Musculoskeletal: Positive for low back pain and gait problem associated  with left leg weakness. Negative for neck pain and neck stiffness.  Skin: Negative for itching and rash.  Neurological: Positive for left lower leg weakness and decreased sensation. Negative for dizziness, headaches, light-headedness and seizures.  Hematological: Negative for adenopathy. Does not bruise/bleed easily.  Psychiatric/Behavioral: Negative for confusion, depression and sleep disturbance. The patient is not nervous/anxious.     PHYSICAL EXAMINATION:  Blood pressure 106/65, pulse (!) 110, temperature 98.7 F (37.1 C), resp. rate 18, height 5\' 7"  (1.702 m), weight 115 lb 6.4 oz (52.3 kg), SpO2 100 %.  ECOG PERFORMANCE STATUS: 2 - Symptomatic, <50% confined to  bed  Physical Exam  Constitutional: Oriented to person, place, and time and frail appearing female and in no distress.  HENT:  Head: Normocephalic and atraumatic.  Mouth/Throat: Oropharynx is clear and moist. No oropharyngeal exudate.  Eyes: Conjunctivae are normal. Right eye exhibits no discharge. Left eye exhibits no discharge. No scleral icterus.  Neck: Normal range of motion. Neck supple.  Cardiovascular: Normal rate, regular rhythm, normal heart sounds and intact distal pulses.   Pulmonary/Chest: Effort normal and breath sounds normal. No respiratory distress. No wheezes. No rales.  Abdominal: Soft. Bowel sounds are normal. Exhibits no distension and no mass. There is no tenderness.  Musculoskeletal: Left lower foot swelling. Decreased range of motion of her left lower extremity. Lymphadenopathy:    No cervical adenopathy.  Neurological: Alert and oriented to person, place, and time. Decreased sensation in her left leg and weakness in her left lower extremity.  Skin: Skin is warm and dry. No rash noted. Not diaphoretic. No erythema. No pallor.  Psychiatric: Mood, memory and judgment normal.  Vitals reviewed.  LABORATORY DATA: Lab Results  Component Value Date   WBC 12.8 (H) 05/02/2019   HGB 9.5 (L) 05/02/2019   HCT 30.4 (L) 05/02/2019   MCV 96.8 05/02/2019   PLT 65 (L) 05/02/2019      Chemistry      Component Value Date/Time   NA 136 05/02/2019 1028   K 4.9 05/02/2019 1028   CL 103 05/02/2019 1028   CO2 24 05/02/2019 1028   BUN 24 (H) 05/02/2019 1028   CREATININE 0.70 05/02/2019 1028      Component Value Date/Time   CALCIUM 8.4 (L) 05/02/2019 1028   ALKPHOS 172 (H) 05/02/2019 1028   AST 69 (H) 05/02/2019 1028   ALT 171 (H) 05/02/2019 1028   BILITOT 0.2 (L) 05/02/2019 1028       RADIOGRAPHIC STUDIES:  No results found.   ASSESSMENT/PLAN:  This is a very pleasant 63 year old Caucasian female with metastatic poorly differentiated malignant neoplasm of unknown  primary at this point but according to the pathologist at the cancer center it is likely poorly differentiated non-small cell carcinoma of lung primary.  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 PDL 1 expression was 99%. The molecular studies by foundation 1 as well as [employer] showed no actionable mutations. The patient was a started on treatment with systemic chemotherapy with carboplatin for AUC of 5, Alimta 500 mg/M2 and Keytruda 200 mg IV every 3 weeks. She is status post 4 cycles with partial response.  The patient was started on maintenance treatment with Alimta and Keytruda status post 13 cycles. This was discontinued to to evidence of disease progression with disease progression with epidural extension of her tumor in the lumbar spine. She subsequently received palliative radiotherapy to this area under the care of Dr.  Manning. Her last radiation treatment was on 04/18/2019.   The patient is currently undergoing treatment with Docetaxel 75 mg/m2 and Cyramza 10 mg/kg IV every 3 weeks with neulasta support. She is status post her first cycle. She tolerated treatment well without any adverse side effects.   The patient was seen with Dr. Julien Nordmann today. Labs were reviewed. Her platelet count is 65,000 today. She denies any bleeding at this time. We will continue to monitor her labs weekly. She was advised to seek medical attention if she develops any significant bleeding.   We will see the patient back for a follow up visit in 2 weeks for evaluation before starting cycle #2.   She will continue performing exercises at home for her lower extremity weakness. She will continue with the current pain regimen for now consisting of 75 mcg patches of fentanyl, ibuprofen two tablets per day, and oxycodone as needed for breakthrough pain. She will discontinue taking gabapentin as part of her pain management. We discussed that she can restart it in the future if she  feels as though she needs it. She knows to avoid tylenol due to her elevated LFTs  I have sent refills of Linzess, oxycodone, gabapentin, and fentanyl patches to her pharmacy.   She will continue to taper her decadron.   Regarding her left foot swelling, this could possibly be secondary to her metastatic tumor to her lumbar spine and associated damage from her recent radiation treatment. She states that her swelling completely resolves with elevation of her left lower extremity. We will continue to monitor this for now and consider a doppler ultrasound if her lower extremity swelling worsens or fails to improve.   She was advised that she may use salt water rinses and biotene for her gingival sensitivity.   The patient was advised to call immediately if she has any concerning symptoms in the interval. The patient voices understanding of current disease status and treatment options and is in agreement with the current care plan. All questions were answered. The patient knows to call the clinic with any problems, questions or concerns. We can certainly see the patient much sooner if necessary  No orders of the defined types were placed in this encounter.    Alani Sabbagh L Orpheus [place], PA-C 05/02/19  ADDENDUM: Hematology/Oncology Attending: I had a face-to-face encounter with the patient today.  I recommended her care plan.  This is a very pleasant 63 years old white female with metastatic non-small cell lung cancer, adenocarcinoma status post several radiotherapy treatment as well as systemic chemotherapy with induction carboplatin, Alimta and Keytruda followed by maintenance treatment with Alimta and Keytruda status post 13 cycles discontinued secondary to disease progression. The patient started last week second line treatment with docetaxel and Cyramza.  She tolerated the first week of her treatment well with no concerning adverse effects.  She continues to have mild pain and neuropathy in the  lower extremities especially the left side with some improvement compare to few weeks ago. I recommended for the patient to continue her current systemic chemotherapy and she will start cycle #2 in 2 weeks. For the pain management she will continue on her current treatment with gabapentin fentanyl patch as well as oxycodone. She will also continue to taper her current dose of Decadron. We will see the patient back for follow-up visit in 2 weeks for evaluation before the next cycle of her treatment. She was advised to call immediately if she has any concerning symptoms in the interval.  Disclaimer: This note was dictated with voice recognition software. Similar sounding words can inadvertently be transcribed and may be missed upon review. Eilleen Kempf, MD 05/02/19

## 2019-05-05 NOTE — Progress Notes (Signed)
  Radiation Oncology         [phone] ________________________________  Name: Rebecca Lucas MRN: [id number]  Date: 04/18/2019  DOB: 1956-05-17  End of Treatment Note  Diagnosis:   63 y.o. patient with previously irradiated recurrent/progressive lumbar metastasis from cancer of the right upper lung     Indication for treatment:  Palliation       Radiation treatment dates:   7/24-04/18/19  Site/dose:   L1-L4 lumbar spine was treated to 30 Gy in 10 fractions  Beams/energy:   The first fraction was treated with 3D AP/PA fields, and then switched to IMRT due to re-irradiation  Narrative: The patient tolerated radiation treatment relatively well.     Plan: The patient has completed radiation treatment. The patient will return to radiation oncology clinic for routine followup in one month. I advised her to call or return sooner if she has any questions or concerns related to her recovery or treatment. ________________________________  Sheral Apley. Tammi Klippel, M.D.

## 2019-05-07 ENCOUNTER — Emergency Department (HOSPITAL_COMMUNITY): Payer: 59

## 2019-05-07 ENCOUNTER — Inpatient Hospital Stay (HOSPITAL_COMMUNITY)
Admission: EM | Admit: 2019-05-07 | Discharge: 2019-05-14 | DRG: 457 | Disposition: A | Discharge status: Rehabilitation Facility | Payer: 59 | Attending: Internal Medicine | Admitting: Internal Medicine

## 2019-05-07 ENCOUNTER — Inpatient Hospital Stay (HOSPITAL_COMMUNITY): Payer: 59

## 2019-05-07 ENCOUNTER — Encounter (HOSPITAL_COMMUNITY): Payer: Self-pay

## 2019-05-07 ENCOUNTER — Other Ambulatory Visit: Payer: Self-pay

## 2019-05-07 DIAGNOSIS — N390 Urinary tract infection, site not specified: Secondary | ICD-10-CM | POA: Diagnosis not present

## 2019-05-07 DIAGNOSIS — R270 Ataxia, unspecified: Secondary | ICD-10-CM | POA: Diagnosis not present

## 2019-05-07 DIAGNOSIS — E871 Hypo-osmolality and hyponatremia: Secondary | ICD-10-CM | POA: Diagnosis not present

## 2019-05-07 DIAGNOSIS — Z03818 Encounter for observation for suspected exposure to other biological agents ruled out: Secondary | ICD-10-CM | POA: Diagnosis not present

## 2019-05-07 DIAGNOSIS — G9581 Conus medullaris syndrome: Secondary | ICD-10-CM | POA: Diagnosis not present

## 2019-05-07 DIAGNOSIS — Z4789 Encounter for other orthopedic aftercare: Secondary | ICD-10-CM | POA: Diagnosis not present

## 2019-05-07 DIAGNOSIS — C7949 Secondary malignant neoplasm of other parts of nervous system: Secondary | ICD-10-CM | POA: Diagnosis not present

## 2019-05-07 DIAGNOSIS — C3411 Malignant neoplasm of upper lobe, right bronchus or lung: Secondary | ICD-10-CM | POA: Diagnosis present

## 2019-05-07 DIAGNOSIS — G629 Polyneuropathy, unspecified: Secondary | ICD-10-CM | POA: Diagnosis present

## 2019-05-07 DIAGNOSIS — M898X8 Other specified disorders of bone, other site: Secondary | ICD-10-CM | POA: Diagnosis not present

## 2019-05-07 DIAGNOSIS — D62 Acute posthemorrhagic anemia: Secondary | ICD-10-CM | POA: Diagnosis not present

## 2019-05-07 DIAGNOSIS — Z8669 Personal history of other diseases of the nervous system and sense organs: Secondary | ICD-10-CM | POA: Diagnosis not present

## 2019-05-07 DIAGNOSIS — R609 Edema, unspecified: Secondary | ICD-10-CM | POA: Diagnosis not present

## 2019-05-07 DIAGNOSIS — M4325 Fusion of spine, thoracolumbar region: Secondary | ICD-10-CM | POA: Diagnosis not present

## 2019-05-07 DIAGNOSIS — C7951 Secondary malignant neoplasm of bone: Principal | ICD-10-CM | POA: Diagnosis present

## 2019-05-07 DIAGNOSIS — R000 Tachycardia, unspecified: Secondary | ICD-10-CM | POA: Diagnosis not present

## 2019-05-07 DIAGNOSIS — R29898 Other symptoms and signs involving the musculoskeletal system: Secondary | ICD-10-CM | POA: Diagnosis not present

## 2019-05-07 DIAGNOSIS — R338 Other retention of urine: Secondary | ICD-10-CM | POA: Diagnosis present

## 2019-05-07 DIAGNOSIS — R140 Abdominal distension (gaseous): Secondary | ICD-10-CM | POA: Diagnosis not present

## 2019-05-07 DIAGNOSIS — Z20828 Contact with and (suspected) exposure to other viral communicable diseases: Secondary | ICD-10-CM | POA: Diagnosis present

## 2019-05-07 DIAGNOSIS — D492 Neoplasm of unspecified behavior of bone, soft tissue, and skin: Secondary | ICD-10-CM | POA: Diagnosis not present

## 2019-05-07 DIAGNOSIS — D72828 Other elevated white blood cell count: Secondary | ICD-10-CM | POA: Diagnosis not present

## 2019-05-07 DIAGNOSIS — M48061 Spinal stenosis, lumbar region without neurogenic claudication: Secondary | ICD-10-CM | POA: Diagnosis present

## 2019-05-07 DIAGNOSIS — R339 Retention of urine, unspecified: Secondary | ICD-10-CM

## 2019-05-07 DIAGNOSIS — G8918 Other acute postprocedural pain: Secondary | ICD-10-CM | POA: Diagnosis not present

## 2019-05-07 DIAGNOSIS — R739 Hyperglycemia, unspecified: Secondary | ICD-10-CM | POA: Diagnosis not present

## 2019-05-07 DIAGNOSIS — C3490 Malignant neoplasm of unspecified part of unspecified bronchus or lung: Secondary | ICD-10-CM | POA: Diagnosis not present

## 2019-05-07 DIAGNOSIS — G834 Cauda equina syndrome: Secondary | ICD-10-CM | POA: Diagnosis present

## 2019-05-07 DIAGNOSIS — R740 Nonspecific elevation of levels of transaminase and lactic acid dehydrogenase [LDH]: Secondary | ICD-10-CM | POA: Diagnosis not present

## 2019-05-07 DIAGNOSIS — D696 Thrombocytopenia, unspecified: Secondary | ICD-10-CM | POA: Diagnosis present

## 2019-05-07 DIAGNOSIS — M545 Low back pain: Secondary | ICD-10-CM | POA: Diagnosis not present

## 2019-05-07 DIAGNOSIS — M21372 Foot drop, left foot: Secondary | ICD-10-CM | POA: Diagnosis not present

## 2019-05-07 DIAGNOSIS — Z419 Encounter for procedure for purposes other than remedying health state, unspecified: Secondary | ICD-10-CM | POA: Diagnosis not present

## 2019-05-07 DIAGNOSIS — M4804 Spinal stenosis, thoracic region: Secondary | ICD-10-CM | POA: Diagnosis present

## 2019-05-07 DIAGNOSIS — D497 Neoplasm of unspecified behavior of endocrine glands and other parts of nervous system: Secondary | ICD-10-CM | POA: Diagnosis not present

## 2019-05-07 DIAGNOSIS — Z7983 Long term (current) use of bisphosphonates: Secondary | ICD-10-CM

## 2019-05-07 DIAGNOSIS — R945 Abnormal results of liver function studies: Secondary | ICD-10-CM | POA: Diagnosis not present

## 2019-05-07 DIAGNOSIS — Z23 Encounter for immunization: Secondary | ICD-10-CM | POA: Diagnosis not present

## 2019-05-07 DIAGNOSIS — M8458XA Pathological fracture in neoplastic disease, other specified site, initial encounter for fracture: Secondary | ICD-10-CM | POA: Diagnosis not present

## 2019-05-07 DIAGNOSIS — R1084 Generalized abdominal pain: Secondary | ICD-10-CM | POA: Diagnosis not present

## 2019-05-07 DIAGNOSIS — G893 Neoplasm related pain (acute) (chronic): Secondary | ICD-10-CM | POA: Diagnosis present

## 2019-05-07 DIAGNOSIS — R7989 Other specified abnormal findings of blood chemistry: Secondary | ICD-10-CM

## 2019-05-07 DIAGNOSIS — D630 Anemia in neoplastic disease: Secondary | ICD-10-CM | POA: Diagnosis present

## 2019-05-07 DIAGNOSIS — G9529 Other cord compression: Secondary | ICD-10-CM | POA: Diagnosis present

## 2019-05-07 DIAGNOSIS — K5903 Drug induced constipation: Secondary | ICD-10-CM | POA: Diagnosis not present

## 2019-05-07 DIAGNOSIS — T402X5A Adverse effect of other opioids, initial encounter: Secondary | ICD-10-CM | POA: Diagnosis not present

## 2019-05-07 DIAGNOSIS — T380X5A Adverse effect of glucocorticoids and synthetic analogues, initial encounter: Secondary | ICD-10-CM | POA: Diagnosis not present

## 2019-05-07 DIAGNOSIS — D72825 Bandemia: Secondary | ICD-10-CM | POA: Diagnosis not present

## 2019-05-07 DIAGNOSIS — E876 Hypokalemia: Secondary | ICD-10-CM | POA: Diagnosis not present

## 2019-05-07 DIAGNOSIS — Z01818 Encounter for other preprocedural examination: Secondary | ICD-10-CM | POA: Diagnosis not present

## 2019-05-07 DIAGNOSIS — K592 Neurogenic bowel, not elsewhere classified: Secondary | ICD-10-CM | POA: Diagnosis not present

## 2019-05-07 DIAGNOSIS — B370 Candidal stomatitis: Secondary | ICD-10-CM | POA: Diagnosis not present

## 2019-05-07 DIAGNOSIS — C801 Malignant (primary) neoplasm, unspecified: Secondary | ICD-10-CM | POA: Diagnosis not present

## 2019-05-07 DIAGNOSIS — R188 Other ascites: Secondary | ICD-10-CM | POA: Diagnosis not present

## 2019-05-07 DIAGNOSIS — M5416 Radiculopathy, lumbar region: Secondary | ICD-10-CM | POA: Diagnosis not present

## 2019-05-07 HISTORY — DX: Malignant neoplasm of upper lobe, right bronchus or lung: C34.11

## 2019-05-07 LAB — COMPREHENSIVE METABOLIC PANEL
ALT: 79 U/L — ABNORMAL HIGH (ref 0–44)
AST: 47 U/L — ABNORMAL HIGH (ref 15–41)
Albumin: 2.5 g/dL — ABNORMAL LOW (ref 3.5–5.0)
Alkaline Phosphatase: 200 U/L — ABNORMAL HIGH (ref 38–126)
Anion gap: 8 (ref 5–15)
BUN: 21 mg/dL (ref 8–23)
CO2: 24 mmol/L (ref 22–32)
Calcium: 8.8 mg/dL — ABNORMAL LOW (ref 8.9–10.3)
Chloride: 104 mmol/L (ref 98–111)
Creatinine, Ser: 0.73 mg/dL (ref 0.44–1.00)
GFR calc Af Amer: >60 mL/min (ref >60)
GFR calc non Af Amer: >60 mL/min (ref >60)
Glucose, Bld: 124 mg/dL — ABNORMAL HIGH (ref 70–99)
Potassium: 4 mmol/L (ref 3.5–5.1)
Sodium: 136 mmol/L (ref 135–145)
Total Bilirubin: <0.1 mg/dL — ABNORMAL LOW (ref 0.3–1.2)
Total Protein: 5.6 g/dL — ABNORMAL LOW (ref 6.5–8.1)

## 2019-05-07 LAB — CBC WITH DIFFERENTIAL/PLATELET
Abs Immature Granulocytes: 0.9 10*3/uL — ABNORMAL HIGH (ref 0.00–0.07)
Band Neutrophils: 7 %
Basophils Absolute: 0 10*3/uL (ref 0.0–0.1)
Basophils Relative: 0 %
Eosinophils Absolute: 0 10*3/uL (ref 0.0–0.5)
Eosinophils Relative: 0 %
HCT: 28.3 % — ABNORMAL LOW (ref 36.0–46.0)
Hemoglobin: 8.8 g/dL — ABNORMAL LOW (ref 12.0–15.0)
Lymphocytes Relative: 2 %
Lymphs Abs: 0.3 10*3/uL — ABNORMAL LOW (ref 0.7–4.0)
MCH: 31.5 pg (ref 26.0–34.0)
MCHC: 31.1 g/dL (ref 30.0–36.0)
MCV: 101.4 fL — ABNORMAL HIGH (ref 80.0–100.0)
Metamyelocytes Relative: 1 %
Monocytes Absolute: 0.4 10*3/uL (ref 0.1–1.0)
Monocytes Relative: 3 %
Myelocytes: 5 %
Neutro Abs: 13.1 10*3/uL — ABNORMAL HIGH (ref 1.7–7.7)
Neutrophils Relative %: 82 %
Platelets: UNDETERMINED 10*3/uL (ref 150–400)
RBC: 2.79 MIL/uL — ABNORMAL LOW (ref 3.87–5.11)
RDW: 17.2 % — ABNORMAL HIGH (ref 11.5–15.5)
WBC: 14.7 10*3/uL — ABNORMAL HIGH (ref 4.0–10.5)
nRBC: 0 % (ref 0.0–0.2)

## 2019-05-07 LAB — URINALYSIS, ROUTINE W REFLEX MICROSCOPIC
Bilirubin Urine: NEGATIVE
Glucose, UA: NEGATIVE mg/dL
Ketones, ur: NEGATIVE mg/dL
Leukocytes,Ua: NEGATIVE
Nitrite: NEGATIVE
Protein, ur: 30 mg/dL — AB
Specific Gravity, Urine: 1.013 (ref 1.005–1.030)
pH: 6 (ref 5.0–8.0)

## 2019-05-07 LAB — PLATELET COUNT: Platelets: 68 10*3/uL — ABNORMAL LOW (ref 150–400)

## 2019-05-07 MED ORDER — SODIUM CHLORIDE 0.9% FLUSH
3.0000 mL | Freq: Two times a day (BID) | INTRAVENOUS | Status: DC
  Administered 2019-05-07: 3 mL via INTRAVENOUS
  Administered 2019-05-08: 5 mL via INTRAVENOUS
  Administered 2019-05-08 – 2019-05-13 (×5): 3 mL via INTRAVENOUS

## 2019-05-07 MED ORDER — LINACLOTIDE 72 MCG PO CAPS
72.0000 ug | ORAL_CAPSULE | Freq: Every day | ORAL | Status: DC
  Administered 2019-05-10 – 2019-05-14 (×5): 72 ug via ORAL
  Filled 2019-05-07 (×7): qty 1

## 2019-05-07 MED ORDER — ACETAMINOPHEN 650 MG RE SUPP: 650.0000 mg | Freq: Four times a day (QID) | RECTAL | Status: DC | PRN

## 2019-05-07 MED ORDER — MORPHINE SULFATE (PF) 2 MG/ML IV SOLN
1.0000 mg | INTRAVENOUS | Status: DC | PRN
  Administered 2019-05-09 – 2019-05-10 (×7): 2 mg via INTRAVENOUS
  Filled 2019-05-07 (×7): qty 1

## 2019-05-07 MED ORDER — DEXAMETHASONE SODIUM PHOSPHATE 10 MG/ML IJ SOLN
6.0000 mg | Freq: Four times a day (QID) | INTRAMUSCULAR | Status: DC
  Administered 2019-05-07 – 2019-05-14 (×27): 6 mg via INTRAVENOUS
  Filled 2019-05-07 (×5): qty 0.6
  Filled 2019-05-07: qty 1
  Filled 2019-05-07 (×26): qty 0.6

## 2019-05-07 MED ORDER — OXYCODONE HCL 5 MG PO TABS
5.0000 mg | ORAL_TABLET | Freq: Four times a day (QID) | ORAL | Status: DC | PRN
  Administered 2019-05-09 – 2019-05-10 (×3): 5 mg via ORAL
  Filled 2019-05-07 (×3): qty 1

## 2019-05-07 MED ORDER — SENNOSIDES-DOCUSATE SODIUM 8.6-50 MG PO TABS: 1.0000 | ORAL_TABLET | Freq: Every evening | ORAL | Status: DC | PRN

## 2019-05-07 MED ORDER — FENTANYL 75 MCG/HR TD PT72: 1.0000 | MEDICATED_PATCH | TRANSDERMAL | Status: DC

## 2019-05-07 MED ORDER — ONDANSETRON HCL 4 MG/2ML IJ SOLN
4.0000 mg | Freq: Four times a day (QID) | INTRAMUSCULAR | Status: DC | PRN
  Administered 2019-05-09: 4 mg via INTRAVENOUS

## 2019-05-07 MED ORDER — ZOLPIDEM TARTRATE 5 MG PO TABS
5.0000 mg | ORAL_TABLET | Freq: Every evening | ORAL | Status: DC | PRN
  Administered 2019-05-09 – 2019-05-11 (×3): 5 mg via ORAL
  Filled 2019-05-07 (×3): qty 1

## 2019-05-07 MED ORDER — ONDANSETRON HCL 4 MG PO TABS: 4.0000 mg | ORAL_TABLET | Freq: Four times a day (QID) | ORAL | Status: DC | PRN

## 2019-05-07 MED ORDER — GABAPENTIN 300 MG PO CAPS
300.0000 mg | ORAL_CAPSULE | Freq: Three times a day (TID) | ORAL | Status: DC
  Administered 2019-05-07 – 2019-05-14 (×18): 300 mg via ORAL
  Filled 2019-05-07 (×18): qty 1

## 2019-05-07 MED ORDER — PANTOPRAZOLE SODIUM 40 MG PO TBEC
40.0000 mg | DELAYED_RELEASE_TABLET | Freq: Every day | ORAL | Status: DC
  Administered 2019-05-08 – 2019-05-14 (×7): 40 mg via ORAL
  Filled 2019-05-07 (×8): qty 1

## 2019-05-07 MED ORDER — ACETAMINOPHEN 325 MG PO TABS: 650.0000 mg | ORAL_TABLET | Freq: Four times a day (QID) | ORAL | Status: DC | PRN

## 2019-05-07 MED ORDER — GADOBUTROL 1 MMOL/ML IV SOLN
5.0000 mL | Freq: Once | INTRAVENOUS | Status: AC | PRN
  Administered 2019-05-07: 5 mL via INTRAVENOUS

## 2019-05-07 NOTE — ED Notes (Signed)
Pt states her Fentanyl patch on her left stomach that it is not due until Thursday

## 2019-05-07 NOTE — H&P (Addendum)
History and Physical    Rebecca Lucas:[id number] DOB: 1956-05-17 DOA: 05/07/2019  PCP: Caren Macadam, MD  Patient coming from: Home  I have personally briefly reviewed patient's old medical records in [place]  Chief Complaint: Urinary retention  HPI: Rebecca Lucas is a 63 y.o. 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  Patient states she has developed new difficulty with urination about 4-5 days ago.  Initially had difficulty initiating her stream.  Yesterday she was unable to void urine at all and began to develop urgency and suprapubic pressure therefore presented to the ED for further evaluation.  She states she has had weakness and numbness in her left lower extremity but has been able to ambulate with a walker until recently.  Her weakness has worsened in the left leg and now she is requiring the use of a wheelchair.  She reports an injury to her ankle/foot several weeks ago and since that time has had swelling in her left leg which is unchanged.  She has been having some tingling sensation in her fingertips.  She has been on oral dexamethasone which she has weaned off of with last dose taken 2 or 3 days ago.  She otherwise denies any subjective fevers, chills, diaphoresis, dyspnea, chest pain, abdominal pain, diarrhea, or obvious bleeding including epistaxis, hemoptysis, hematemesis, hematuria, hematochezia, or melena.  ED Course:  Initial vitals showed BP 103/57, pulse 120, RR 17, temp 99.0 Fahrenheit, SPO2 100% on room air.  Labs notable for WBC 14.7, hemoglobin 8.8, platelet clumps noted on smear, AST 47, ALT 79, alk phos 200, total bilirubin <0.1, BUN 21, creatinine 0.73, sodium 136, potassium 4.0, bicarb  24.  Foley catheter was placed with reported >600 cc urine output.  Urinalysis was negative for UTI.  SARS-CoV-2 screening test was collected and pending.  MRI cervical/thoracic/lumbar spine with and without contrast showed: -Progressive bulky spinal paraspinal tumor from L1-L3 with greater involvement of the L3 vertebral body and increased left-sided epidural tumor resulting in progressive moderate-severe spinal canal stenosis.   -C2 and C3 osseous metastatic disease appeared unchanged compared to neck CT from February 2020 with significantly decrease in size compared to MRI C-spine June 2019.   -There was no evidence of osseous metastatic disease in the thoracic spine.  EDP discussed the case with oncology, Dr. Julien Nordmann, who recommended starting Decadron 6 mg q6h, neurosurgery consult, and will see tomorrow.  Neurosurgery reviewed the images and recommended admission to [facility] for L2 laminectomy decompression.  The hospitalist service was consulted to admit.   Review of Systems: All systems reviewed and are negative except as documented in history of present illness above.   Past Medical History:  Diagnosis Date   Cancer of upper lobe of right lung [facility])     Past Surgical History:  Procedure Laterality Date   IR IMAGING GUIDED PORT INSERTION  06/26/2018    Social History:  reports that she has never smoked. She has never used smokeless tobacco. She reports current alcohol use. She reports that she does not use drugs.  No Known Allergies  Family History  Problem Relation Age of Onset   ALS Mother 12       non genetic   Cancer Neg Hx      Prior  to Admission medications   Medication Sig Start Date End Date Taking? Authorizing Provider  acetaminophen (TYLENOL) 500 MG tablet Take 1,000 mg by mouth every 6 (six) hours as needed for mild pain.    Yes [provider]  butalbital-aspirin-caffeine Acquanetta Chain) 50-325-40 MG capsule Take 1 capsule by mouth  every 6 (six) hours as needed for migraine.  12/05/17  Yes [provider]  Calcium Carbonate-Vitamin D (CALCIUM-D PO) Take 1 tablet by mouth 2 (two) times daily.   Yes [provider]  dexamethasone (DECADRON) 4 MG tablet Take 2 tablets twice a day (4 tablets total per day) the day before, the day of, and the day after chemotherapy 04/17/19  Yes Heilingoetter, Cassandra L, PA-C  fentaNYL (DURAGESIC) 75 MCG/HR Place 1 patch onto the skin every 3 (three) days for 15 days. 05/02/19 05/17/19 Yes Heilingoetter, Cassandra L, PA-C  gabapentin (NEURONTIN) 300 MG capsule Take 1 capsule (300 mg total) by mouth 3 (three) times daily. 05/02/19  Yes Heilingoetter, Cassandra L, PA-C  ibuprofen (ADVIL,MOTRIN) 200 MG tablet Take 600 mg by mouth every 6 (six) hours as needed for mild pain.    Yes [provider]  lidocaine-prilocaine (EMLA) cream Apply 1 application topically as needed. Patient taking differently: Apply 1 application topically as needed (pain).  06/13/18  Yes Curt Bears, MD  linaclotide [facility]) 72 MCG capsule TAKE 1 CAPSULE BY MOUTH ONCE DAILY BEFORE BREAKFAST Patient taking differently: Take 72 mcg by mouth daily before breakfast.  05/02/19  Yes Heilingoetter, Cassandra L, PA-C  LUTEIN PO Take 1 tablet by mouth daily.   Yes [provider]  omeprazole (PRILOSEC) 20 MG capsule TAKE 1 CAPSULE (20 MG TOTAL) BY MOUTH DAILY. 04/08/19  Yes Curt Bears, MD  ondansetron (ZOFRAN) 8 MG tablet TAKE 1 TABLET (8 MG TOTAL) BY MOUTH EVERY 8 HOURS AS NEEDED FOR NAUSEA OR VOMITING. Patient taking differently: Take 8 mg by mouth every 8 (eight) hours as needed for nausea or vomiting.  03/20/19  Yes Curt Bears, MD  oxyCODONE (OXY IR/ROXICODONE) 5 MG immediate release tablet Take 1 tablet (5 mg total) by mouth every 6 (six) hours as needed for severe pain. 05/02/19  Yes Heilingoetter, Cassandra L, PA-C  traMADol (ULTRAM) 50 MG tablet Take 1 tablet (50 mg total) by mouth every 6  (six) hours as needed for moderate pain. 03/06/19  Yes Curt Bears, MD  zolpidem (AMBIEN) 10 MG tablet Take 1 tablet (10 mg total) by mouth at bedtime as needed for sleep. 03/21/18  Yes Curt Bears, MD    Physical Exam: Vitals:   05/07/19 1700 05/07/19 1800 05/07/19 1815 05/07/19 1900  BP: (!) 106/53 (!) 110/58  (!) 111/59  Pulse: (!) 106  (!) 106 (!) 111  Resp:    17  Temp:      TempSrc:      SpO2: 96%  100% 93%    Constitutional: Resting supine in bed, NAD, calm, comfortable Eyes: PERRL, EOMI, lids and conjunctivae normal ENMT: Mucous membranes are moist. Posterior pharynx clear of any exudate or lesions.Normal dentition.  Neck: normal, supple, no masses. Respiratory: clear to auscultation bilaterally, no wheezing, no crackles. Normal respiratory effort. No accessory muscle use.  Cardiovascular: Regular rate and rhythm, no murmurs / rubs / gallops.  +1 left lower extremity edema and trace edema around right ankle. 2+ pedal pulses on the right, difficult to palpate on the left. Abdomen: no tenderness, no masses palpated. No hepatosplenomegaly. Bowel sounds positive.  GU: Foley catheter in place  Musculoskeletal: no clubbing / cyanosis. No joint deformity upper and lower extremities.  ROM diminished left lower extremity at the hip, knee, and ankle  Skin: no rashes, lesions, ulcers. No induration Neurologic: CN 2-12 grossly intact. Sensation is diminished in the left lower extremity otherwise intact in all other extremities.  Strength 0/5 left lower extremity at the hip and with dorsiflexion at the left ankle, 2/5 with plantar flexion at the left ankle, strength 5/5 otherwise in all other extremities. Psychiatric: Normal judgment and insight. Alert and oriented x 3. Normal mood.   Labs on Admission: I have personally reviewed following labs and imaging studies  CBC: Recent Labs  Lab 05/02/19 1028 05/07/19 1005  WBC 12.8* 14.7*  NEUTROABS 11.6 13.1*  HGB 9.5* 8.8*  HCT  30.4* 28.3*  MCV 96.8 101.4*  PLT 65* PLATELET CLUMPS NOTED ON SMEAR, UNABLE TO ESTIMATE   Basic Metabolic Panel: Recent Labs  Lab 05/02/19 1028 05/07/19 1005  NA 136 136  K 4.9 4.0  CL 103 104  CO2 24 24  GLUCOSE 136* 124*  BUN 24* 21  CREATININE 0.70 0.73  CALCIUM 8.4* 8.8*   GFR: Estimated Creatinine Clearance: 61 mL/min (by C-G formula based on SCr of 0.73 mg/dL). Liver Function Tests: Recent Labs  Lab 05/02/19 1028 05/07/19 1005  AST 69* 47*  ALT 171* 79*  ALKPHOS 172* 200*  BILITOT 0.2* <0.1*  PROT 5.7* 5.6*  ALBUMIN 2.7* 2.5*   No results for input(s): LIPASE, AMYLASE in the last 168 hours. No results for input(s): AMMONIA in the last 168 hours. Coagulation Profile: No results for input(s): INR, PROTIME in the last 168 hours. Cardiac Enzymes: No results for input(s): CKTOTAL, CKMB, CKMBINDEX, TROPONINI in the last 168 hours. BNP (last 3 results) No results for input(s): PROBNP in the last 8760 hours. HbA1C: No results for input(s): HGBA1C in the last 72 hours. CBG: No results for input(s): GLUCAP in the last 168 hours. Lipid Profile: No results for input(s): CHOL, HDL, LDLCALC, TRIG, CHOLHDL, LDLDIRECT in the last 72 hours. Thyroid Function Tests: No results for input(s): TSH, T4TOTAL, FREET4, T3FREE, THYROIDAB in the last 72 hours. Anemia Panel: No results for input(s): VITAMINB12, FOLATE, FERRITIN, TIBC, IRON, RETICCTPCT in the last 72 hours. Urine analysis:    Component Value Date/Time   COLORURINE YELLOW 05/07/2019 1005   APPEARANCEUR CLEAR 05/07/2019 1005   LABSPEC 1.013 05/07/2019 1005   PHURINE 6.0 05/07/2019 1005   GLUCOSEU NEGATIVE 05/07/2019 1005   HGBUR SMALL (A) 05/07/2019 1005   BILIRUBINUR NEGATIVE 05/07/2019 1005   KETONESUR NEGATIVE 05/07/2019 1005   PROTEINUR 30 (A) 05/07/2019 1005   UROBILINOGEN 0.2 08/09/2010 1026   NITRITE NEGATIVE 05/07/2019 1005   LEUKOCYTESUR NEGATIVE 05/07/2019 1005    Radiological Exams on  Admission: Mr Cervical Spine W Or Wo Contrast  Result Date: 05/07/2019 CLINICAL DATA:  Urinary retention since 5 a.m. History of metastatic poorly differentiated neoplasm, possibly lung cancer. Currently on chemotherapy. EXAM: MRI CERVICAL, THORACIC AND LUMBAR SPINE WITHOUT AND WITH CONTRAST TECHNIQUE: Multiplanar and multiecho pulse sequences of the cervical spine, to include the craniocervical junction and cervicothoracic junction, and thoracic and lumbar spine, were obtained without and with intravenous contrast. CONTRAST:  5 mL Gadavist intravenous contrast. COMPARISON:  MRI lumbar spine dated March 21, 2019. CT chest, abdomen, and pelvis dated February 19, 2019. CT neck dated October 16, 2018. MRI cervical spine dated February 24, 2018. FINDINGS: MRI CERVICAL SPINE FINDINGS Alignment: New 2 mm anterolisthesis at C2-C3. Vertebrae: Enhancing expansile  lesion in the C2 and C3 posterior elements is similar to CT neck from February, and significantly decreased in size since cervical spine MRI and June 2019. No new suspicious bone lesion. No fracture or evidence of discitis. Cord: Normal signal and morphology.  No intradural enhancement. Posterior Fossa, vertebral arteries, paraspinal tissues: Negative. Disc levels: C2-C3: Negative disc. Mild bilateral facet arthropathy. No stenosis. C3-C4: Unchanged small left paracentral disc protrusion. No stenosis. C4-C5: Unchanged mild disc bulging eccentric to the left and mild bilateral facet uncovertebral hypertrophy. No stenosis. C5-C6: Unchanged mild disc bulging and bilateral facet uncovertebral hypertrophy. Unchanged mild left neuroforaminal stenosis. No spinal canal or right neuroforaminal stenosis. C6-C7:  Unchanged small central disc protrusion.  No stenosis. C7-T1:  Negative. MRI THORACIC SPINE FINDINGS Alignment:  Physiologic. Vertebrae: No fracture, evidence of discitis, or bone lesion. Cord:  Normal signal and morphology.  No intradural enhancement. Paraspinal and other  soft tissues: Stable radiation changes in the posterior right upper lobe. Slightly increased small right pleural effusion. New trace left pleural effusion. Disc levels: Small central disc protrusions and minimal disc bulging from T7-T8 through T11-T12. No spinal canal or neuroforaminal stenosis. MRI LUMBAR SPINE FINDINGS Segmentation:  Standard. Alignment:  Unchanged mild retrolisthesis of L2. Vertebrae: Again seen is tumor involving L1, L2, and L3 vertebral bodies. Tumor involvement of the L3 vertebral body appears increased. Unchanged severe pathologic L2 compression fracture. Unchanged tumor involving the L2 posterior elements. Anterior extraosseous extension of tumor is grossly unchanged. Conus medullaris and cauda equina: Conus extends to the L1 level. Conus and cauda equina appear normal. No intradural enhancement. Paraspinal and other soft tissues: Right sacral ala and left posterior iliac metastases are stable to slightly decreased in size. Disc levels: T12-L1: Unchanged small left paracentral disc protrusion. No stenosis. L1-L2 and L2-L3: Circumferential epidural tumor, worse on the left, has progressed since the prior study, with worsening moderate to severe spinal canal stenosis and severe left lateral recess stenosis at both levels. Unchanged complete tumor infiltration of the left L1-L2 and L2-L3 neural foramina and moderate infiltration of the right L2-L3 neural foramen. L3-L4:  Negative. L4-L5:  Negative. L5-S1:  Negative. IMPRESSION: Cervical spine: 1. Grossly unchanged osseous metastatic disease involving the C2 and C3 posterior elements when compared to neck CT from February 2020. Lesions have significantly decreased in size since MRI cervical spine from June 2019. 2. No progressive metastatic disease. 3. Unchanged mild multilevel cervical spondylosis as described above. No high-grade stenosis. Thoracic spine: 1. No evidence of osseous metastatic disease. 2. Stable radiation changes in the  posterior right upper lobe. 3. Slightly increased small right pleural effusion. New trace left pleural effusion. Lumbar spine: 1. Progressive bulky spinal and paraspinal tumor from L1 through L3, with greater involvement of the L3 vertebral body and increased left-sided epidural tumor resulting in progressive moderate to severe spinal canal stenosis and severe left lateral recess stenosis at L1-L2 and L2-L3. 2. Grossly unchanged involvement of the left L1-L2 and bilateral L2-L3 neural foramina. 3. Stable pathologic compression fracture of L2 with mild retropulsion. 4. Right sacral ala and left posterior iliac metastases are stable to slightly decreased in size. Electronically Signed   By: Titus Dubin M.D.   On: 05/07/2019 16:58   Mr Thoracic Spine W Wo Contrast  Result Date: 05/07/2019 CLINICAL DATA:  Urinary retention since 5 a.m. History of metastatic poorly differentiated neoplasm, possibly lung cancer. Currently on chemotherapy. EXAM: MRI CERVICAL, THORACIC AND LUMBAR SPINE WITHOUT AND WITH CONTRAST TECHNIQUE: Multiplanar and multiecho pulse  sequences of the cervical spine, to include the craniocervical junction and cervicothoracic junction, and thoracic and lumbar spine, were obtained without and with intravenous contrast. CONTRAST:  5 mL Gadavist intravenous contrast. COMPARISON:  MRI lumbar spine dated March 21, 2019. CT chest, abdomen, and pelvis dated February 19, 2019. CT neck dated October 16, 2018. MRI cervical spine dated February 24, 2018. FINDINGS: MRI CERVICAL SPINE FINDINGS Alignment: New 2 mm anterolisthesis at C2-C3. Vertebrae: Enhancing expansile lesion in the C2 and C3 posterior elements is similar to CT neck from February, and significantly decreased in size since cervical spine MRI and June 2019. No new suspicious bone lesion. No fracture or evidence of discitis. Cord: Normal signal and morphology.  No intradural enhancement. Posterior Fossa, vertebral arteries, paraspinal tissues: Negative. Disc  levels: C2-C3: Negative disc. Mild bilateral facet arthropathy. No stenosis. C3-C4: Unchanged small left paracentral disc protrusion. No stenosis. C4-C5: Unchanged mild disc bulging eccentric to the left and mild bilateral facet uncovertebral hypertrophy. No stenosis. C5-C6: Unchanged mild disc bulging and bilateral facet uncovertebral hypertrophy. Unchanged mild left neuroforaminal stenosis. No spinal canal or right neuroforaminal stenosis. C6-C7:  Unchanged small central disc protrusion.  No stenosis. C7-T1:  Negative. MRI THORACIC SPINE FINDINGS Alignment:  Physiologic. Vertebrae: No fracture, evidence of discitis, or bone lesion. Cord:  Normal signal and morphology.  No intradural enhancement. Paraspinal and other soft tissues: Stable radiation changes in the posterior right upper lobe. Slightly increased small right pleural effusion. New trace left pleural effusion. Disc levels: Small central disc protrusions and minimal disc bulging from T7-T8 through T11-T12. No spinal canal or neuroforaminal stenosis. MRI LUMBAR SPINE FINDINGS Segmentation:  Standard. Alignment:  Unchanged mild retrolisthesis of L2. Vertebrae: Again seen is tumor involving L1, L2, and L3 vertebral bodies. Tumor involvement of the L3 vertebral body appears increased. Unchanged severe pathologic L2 compression fracture. Unchanged tumor involving the L2 posterior elements. Anterior extraosseous extension of tumor is grossly unchanged. Conus medullaris and cauda equina: Conus extends to the L1 level. Conus and cauda equina appear normal. No intradural enhancement. Paraspinal and other soft tissues: Right sacral ala and left posterior iliac metastases are stable to slightly decreased in size. Disc levels: T12-L1: Unchanged small left paracentral disc protrusion. No stenosis. L1-L2 and L2-L3: Circumferential epidural tumor, worse on the left, has progressed since the prior study, with worsening moderate to severe spinal canal stenosis and severe  left lateral recess stenosis at both levels. Unchanged complete tumor infiltration of the left L1-L2 and L2-L3 neural foramina and moderate infiltration of the right L2-L3 neural foramen. L3-L4:  Negative. L4-L5:  Negative. L5-S1:  Negative. IMPRESSION: Cervical spine: 1. Grossly unchanged osseous metastatic disease involving the C2 and C3 posterior elements when compared to neck CT from February 2020. Lesions have significantly decreased in size since MRI cervical spine from June 2019. 2. No progressive metastatic disease. 3. Unchanged mild multilevel cervical spondylosis as described above. No high-grade stenosis. Thoracic spine: 1. No evidence of osseous metastatic disease. 2. Stable radiation changes in the posterior right upper lobe. 3. Slightly increased small right pleural effusion. New trace left pleural effusion. Lumbar spine: 1. Progressive bulky spinal and paraspinal tumor from L1 through L3, with greater involvement of the L3 vertebral body and increased left-sided epidural tumor resulting in progressive moderate to severe spinal canal stenosis and severe left lateral recess stenosis at L1-L2 and L2-L3. 2. Grossly unchanged involvement of the left L1-L2 and bilateral L2-L3 neural foramina. 3. Stable pathologic compression fracture of L2 with mild retropulsion.  4. Right sacral ala and left posterior iliac metastases are stable to slightly decreased in size. Electronically Signed   By: Titus Dubin M.D.   On: 05/07/2019 16:58   Mr Lumbar Spine W Wo Contrast  Result Date: 05/07/2019 CLINICAL DATA:  Urinary retention since 5 a.m. History of metastatic poorly differentiated neoplasm, possibly lung cancer. Currently on chemotherapy. EXAM: MRI CERVICAL, THORACIC AND LUMBAR SPINE WITHOUT AND WITH CONTRAST TECHNIQUE: Multiplanar and multiecho pulse sequences of the cervical spine, to include the craniocervical junction and cervicothoracic junction, and thoracic and lumbar spine, were obtained without and  with intravenous contrast. CONTRAST:  5 mL Gadavist intravenous contrast. COMPARISON:  MRI lumbar spine dated March 21, 2019. CT chest, abdomen, and pelvis dated February 19, 2019. CT neck dated October 16, 2018. MRI cervical spine dated February 24, 2018. FINDINGS: MRI CERVICAL SPINE FINDINGS Alignment: New 2 mm anterolisthesis at C2-C3. Vertebrae: Enhancing expansile lesion in the C2 and C3 posterior elements is similar to CT neck from February, and significantly decreased in size since cervical spine MRI and June 2019. No new suspicious bone lesion. No fracture or evidence of discitis. Cord: Normal signal and morphology.  No intradural enhancement. Posterior Fossa, vertebral arteries, paraspinal tissues: Negative. Disc levels: C2-C3: Negative disc. Mild bilateral facet arthropathy. No stenosis. C3-C4: Unchanged small left paracentral disc protrusion. No stenosis. C4-C5: Unchanged mild disc bulging eccentric to the left and mild bilateral facet uncovertebral hypertrophy. No stenosis. C5-C6: Unchanged mild disc bulging and bilateral facet uncovertebral hypertrophy. Unchanged mild left neuroforaminal stenosis. No spinal canal or right neuroforaminal stenosis. C6-C7:  Unchanged small central disc protrusion.  No stenosis. C7-T1:  Negative. MRI THORACIC SPINE FINDINGS Alignment:  Physiologic. Vertebrae: No fracture, evidence of discitis, or bone lesion. Cord:  Normal signal and morphology.  No intradural enhancement. Paraspinal and other soft tissues: Stable radiation changes in the posterior right upper lobe. Slightly increased small right pleural effusion. New trace left pleural effusion. Disc levels: Small central disc protrusions and minimal disc bulging from T7-T8 through T11-T12. No spinal canal or neuroforaminal stenosis. MRI LUMBAR SPINE FINDINGS Segmentation:  Standard. Alignment:  Unchanged mild retrolisthesis of L2. Vertebrae: Again seen is tumor involving L1, L2, and L3 vertebral bodies. Tumor involvement of the L3  vertebral body appears increased. Unchanged severe pathologic L2 compression fracture. Unchanged tumor involving the L2 posterior elements. Anterior extraosseous extension of tumor is grossly unchanged. Conus medullaris and cauda equina: Conus extends to the L1 level. Conus and cauda equina appear normal. No intradural enhancement. Paraspinal and other soft tissues: Right sacral ala and left posterior iliac metastases are stable to slightly decreased in size. Disc levels: T12-L1: Unchanged small left paracentral disc protrusion. No stenosis. L1-L2 and L2-L3: Circumferential epidural tumor, worse on the left, has progressed since the prior study, with worsening moderate to severe spinal canal stenosis and severe left lateral recess stenosis at both levels. Unchanged complete tumor infiltration of the left L1-L2 and L2-L3 neural foramina and moderate infiltration of the right L2-L3 neural foramen. L3-L4:  Negative. L4-L5:  Negative. L5-S1:  Negative. IMPRESSION: Cervical spine: 1. Grossly unchanged osseous metastatic disease involving the C2 and C3 posterior elements when compared to neck CT from February 2020. Lesions have significantly decreased in size since MRI cervical spine from June 2019. 2. No progressive metastatic disease. 3. Unchanged mild multilevel cervical spondylosis as described above. No high-grade stenosis. Thoracic spine: 1. No evidence of osseous metastatic disease. 2. Stable radiation changes in the posterior right upper lobe. 3. Slightly  increased small right pleural effusion. New trace left pleural effusion. Lumbar spine: 1. Progressive bulky spinal and paraspinal tumor from L1 through L3, with greater involvement of the L3 vertebral body and increased left-sided epidural tumor resulting in progressive moderate to severe spinal canal stenosis and severe left lateral recess stenosis at L1-L2 and L2-L3. 2. Grossly unchanged involvement of the left L1-L2 and bilateral L2-L3 neural foramina. 3.  Stable pathologic compression fracture of L2 with mild retropulsion. 4. Right sacral ala and left posterior iliac metastases are stable to slightly decreased in size. Electronically Signed   By: Titus Dubin M.D.   On: 05/07/2019 16:58    EKG: Not performed.  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  S/p first cycle systemic chemotherapy with docetaxel and Cyramza with Neulasta support.  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  She is symptomatic with left lower extremity numbness and worsening weakness as well as new onset urinary retention.  Neurosurgery consulted and plan for decompressive laminectomy and requesting admission at [place] discussed with oncology, Dr. Julien Nordmann, will see tomorrow. -Admit to [facility] hospital -Continue IV Decadron 6 mg q6h -Will keep n.p.o. at midnight -Neurosurgery plan for decompressive laminectomy -Oncology to follow -Continue neuro checks  Urinary retention: Secondary to above.  Foley placed with good urine output and improvement suprapubic pressure. -Continue Foley care  Anemia and thrombocytopenia: Hemoglobin slightly decreased from recent labs, no obvious bleeding.  Platelet  clumps noted on smear, previously 65,000 on 05/02/2019.  Patient denies any obvious bleeding.  Suspect secondary to chemotherapy. -Repeat platelet count -Follow labs and for signs/symptoms of bleeding and transfuse as needed -Hold pharmacologic VTE prophylaxis  Elevated LFTs: Mildly elevated AST/ALT however improved from prior.  Alk phos slightly increased from prior, likely due to metastatic osseous involvement.  Will continue to monitor.  Cancer associated pain and neuropathy: Patient reports pain is controlled well on home regimen. -Continue Tylenol, fentanyl patch, as needed OxyIR -Continue gabapentin -IV morphine as needed for severe pain  DVT prophylaxis: SCDs Code Status: Full code, confirmed with patient Family Communication: Discussed with husband, Dr. [name], at bedside Disposition Plan: Admit to [facility] per neurosurgery recommendations Consults called: Oncology, neurosurgery Admission status: Inpatient for management of malignant neoplasm with progressive epidural tumor burden in the lumbar region with neurological complications requiring neurosurgical and oncology evaluations and intervention.   Zada Finders MD [facility]  If 7PM-7AM, please contact night-coverage [email or web address]  05/07/2019, 7:47 PM

## 2019-05-07 NOTE — ED Notes (Addendum)
ED TO INPATIENT HANDOFF REPORT  Name/Age/Gender Rebecca Lucas 63 y.o. female  Code Status    Code Status Orders  (From admission, onward)         Start     Ordered   05/07/19 1933  Full code  Continuous     05/07/19 1934        Code Status History    This patient has a current code status but no historical code status.   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 Cancer of upper lobe of right lung ([place])     Allergies No Known Allergies  IV Location/Drains/Wounds Patient Lines/Drains/Airways Status   Active Line/Drains/Airways    Name:   Placement date:   Placement time:   Site:   Days:   Implanted Port 06/28/18   06/28/18    0000    -   313   Peripheral IV 03/23/18 Left;Lateral Forearm   03/23/18    1925    Forearm   410   Urethral Catheter [place], NT Straight-tip 16 Fr.   05/07/19    1108    Straight-tip   less than 1   Incision (Closed) 03/08/18 Neck Mid   03/08/18    1216     425   Incision (Closed) 03/22/18 Back Right;Upper   03/22/18    1446     411          Labs/Imaging Results for orders placed or performed during the hospital encounter of 05/07/19 (from the past 48  hour(s))  CBC with Differential     Status: Abnormal   Collection Time: 05/07/19 10:05 AM  Result Value Ref Range   WBC 14.7 (H) 4.0 - 10.5 K/uL   RBC 2.79 (L) 3.87 - 5.11 MIL/uL   Hemoglobin 8.8 (L) 12.0 - 15.0 g/dL   HCT 28.3 (L) 36.0 - 46.0 %   MCV 101.4 (H) 80.0 - 100.0 fL   MCH 31.5 26.0 - 34.0 pg   MCHC 31.1 30.0 - 36.0 g/dL   RDW 17.2 (H) 11.5 - 15.5 %   Platelets PLATELET CLUMPS NOTED ON SMEAR, UNABLE TO ESTIMATE 150 - 400 K/uL    Comment: Immature Platelet Fraction may be clinically indicated, consider ordering this additional test [id number]    nRBC 0.0 0.0 - 0.2 %   Neutrophils Relative % 82 %   Neutro Abs 13.1 (H) 1.7 - 7.7 K/uL   Band Neutrophils 7 %   Lymphocytes Relative 2 %   Lymphs Abs 0.3 (L) 0.7 - 4.0 K/uL   Monocytes Relative 3 %   Monocytes Absolute 0.4 0.1 - 1.0 K/uL   Eosinophils Relative 0 %   Eosinophils Absolute 0.0 0.0 - 0.5 K/uL  Basophils Relative 0 %   Basophils Absolute 0.0 0.0 - 0.1 K/uL   WBC Morphology      MODERATE LEFT SHIFT (>5% METAS AND MYELOS,OCC PRO NOTED)    Comment: TOXIC GRANULATION   Metamyelocytes Relative 1 %   Myelocytes 5 %   Abs Immature Granulocytes 0.90 (H) 0.00 - 0.07 K/uL   Polychromasia PRESENT     Comment: Performed at [facility], [place] [address], [place], [place] [address]  Urinalysis, Routine w reflex microscopic     Status: Abnormal   Collection Time: 05/07/19 10:05 AM  Result Value Ref Range   Color, Urine YELLOW YELLOW   APPearance CLEAR CLEAR   Specific Gravity, Urine 1.013 1.005 - 1.030   pH 6.0 5.0 - 8.0   Glucose, UA NEGATIVE NEGATIVE mg/dL   Hgb urine dipstick SMALL (A) NEGATIVE   Bilirubin Urine NEGATIVE NEGATIVE   Ketones, ur NEGATIVE NEGATIVE mg/dL   Protein, ur 30 (A) NEGATIVE mg/dL   Nitrite NEGATIVE NEGATIVE   Leukocytes,Ua NEGATIVE NEGATIVE   RBC / HPF 11-20 0 - 5 RBC/hpf   WBC, UA 0-5 0 - 5 WBC/hpf   Bacteria, UA RARE (A) NONE SEEN   Squamous Epithelial / LPF 0-5 0 - 5    Mucus PRESENT    Hyaline Casts, UA PRESENT     Comment: Performed at [facility], [place] [address] [place], [place] [address]  Comprehensive metabolic panel     Status: Abnormal   Collection Time: 05/07/19 10:05 AM  Result Value Ref Range   Sodium 136 135 - 145 mmol/L   Potassium 4.0 3.5 - 5.1 mmol/L   Chloride 104 98 - 111 mmol/L   CO2 24 22 - 32 mmol/L   Glucose, Bld 124 (H) 70 - 99 mg/dL   BUN 21 8 - 23 mg/dL   Creatinine, Ser 0.73 0.44 - 1.00 mg/dL   Calcium 8.8 (L) 8.9 - 10.3 mg/dL   Total Protein 5.6 (L) 6.5 - 8.1 g/dL   Albumin 2.5 (L) 3.5 - 5.0 g/dL   AST 47 (H) 15 - 41 U/L   ALT 79 (H) 0 - 44 U/L   Alkaline Phosphatase 200 (H) 38 - 126 U/L   Total Bilirubin <0.1 (L) 0.3 - 1.2 mg/dL    Comment: REPEATED TO VERIFY   GFR calc non Af Amer >60 >60 mL/min   GFR calc Af Amer >60 >60 mL/min   Anion gap 8 5 - 15    Comment: Performed at [facility], [place] [address], [place], [place] [address]   Mr Cervical Spine W Or Wo Contrast  Result Date: 05/07/2019 CLINICAL DATA:  Urinary retention since 5 a.m. History of metastatic poorly differentiated neoplasm, possibly lung cancer. Currently on chemotherapy. EXAM: MRI CERVICAL, THORACIC AND LUMBAR SPINE WITHOUT AND WITH CONTRAST TECHNIQUE: Multiplanar and multiecho pulse sequences of the cervical spine, to include the craniocervical junction and cervicothoracic junction, and thoracic and lumbar spine, were obtained without and with intravenous contrast. CONTRAST:  5 mL Gadavist intravenous contrast. COMPARISON:  MRI lumbar spine dated March 21, 2019. CT chest, abdomen, and pelvis dated February 19, 2019. CT neck dated October 16, 2018. MRI cervical spine dated February 24, 2018. FINDINGS: MRI CERVICAL SPINE FINDINGS Alignment: New 2 mm anterolisthesis at C2-C3. Vertebrae: Enhancing expansile lesion in the C2 and C3 posterior elements is similar to CT neck from February, and significantly decreased in size since  cervical spine MRI and June 2019. No new  suspicious bone lesion. No fracture or evidence of discitis. Cord: Normal signal and morphology.  No intradural enhancement. Posterior Fossa, vertebral arteries, paraspinal tissues: Negative. Disc levels: C2-C3: Negative disc. Mild bilateral facet arthropathy. No stenosis. C3-C4: Unchanged small left paracentral disc protrusion. No stenosis. C4-C5: Unchanged mild disc bulging eccentric to the left and mild bilateral facet uncovertebral hypertrophy. No stenosis. C5-C6: Unchanged mild disc bulging and bilateral facet uncovertebral hypertrophy. Unchanged mild left neuroforaminal stenosis. No spinal canal or right neuroforaminal stenosis. C6-C7:  Unchanged small central disc protrusion.  No stenosis. C7-T1:  Negative. MRI THORACIC SPINE FINDINGS Alignment:  Physiologic. Vertebrae: No fracture, evidence of discitis, or bone lesion. Cord:  Normal signal and morphology.  No intradural enhancement. Paraspinal and other soft tissues: Stable radiation changes in the posterior right upper lobe. Slightly increased small right pleural effusion. New trace left pleural effusion. Disc levels: Small central disc protrusions and minimal disc bulging from T7-T8 through T11-T12. No spinal canal or neuroforaminal stenosis. MRI LUMBAR SPINE FINDINGS Segmentation:  Standard. Alignment:  Unchanged mild retrolisthesis of L2. Vertebrae: Again seen is tumor involving L1, L2, and L3 vertebral bodies. Tumor involvement of the L3 vertebral body appears increased. Unchanged severe pathologic L2 compression fracture. Unchanged tumor involving the L2 posterior elements. Anterior extraosseous extension of tumor is grossly unchanged. Conus medullaris and cauda equina: Conus extends to the L1 level. Conus and cauda equina appear normal. No intradural enhancement. Paraspinal and other soft tissues: Right sacral ala and left posterior iliac metastases are stable to slightly decreased in size. Disc levels: T12-L1:  Unchanged small left paracentral disc protrusion. No stenosis. L1-L2 and L2-L3: Circumferential epidural tumor, worse on the left, has progressed since the prior study, with worsening moderate to severe spinal canal stenosis and severe left lateral recess stenosis at both levels. Unchanged complete tumor infiltration of the left L1-L2 and L2-L3 neural foramina and moderate infiltration of the right L2-L3 neural foramen. L3-L4:  Negative. L4-L5:  Negative. L5-S1:  Negative. IMPRESSION: Cervical spine: 1. Grossly unchanged osseous metastatic disease involving the C2 and C3 posterior elements when compared to neck CT from February 2020. Lesions have significantly decreased in size since MRI cervical spine from June 2019. 2. No progressive metastatic disease. 3. Unchanged mild multilevel cervical spondylosis as described above. No high-grade stenosis. Thoracic spine: 1. No evidence of osseous metastatic disease. 2. Stable radiation changes in the posterior right upper lobe. 3. Slightly increased small right pleural effusion. New trace left pleural effusion. Lumbar spine: 1. Progressive bulky spinal and paraspinal tumor from L1 through L3, with greater involvement of the L3 vertebral body and increased left-sided epidural tumor resulting in progressive moderate to severe spinal canal stenosis and severe left lateral recess stenosis at L1-L2 and L2-L3. 2. Grossly unchanged involvement of the left L1-L2 and bilateral L2-L3 neural foramina. 3. Stable pathologic compression fracture of L2 with mild retropulsion. 4. Right sacral ala and left posterior iliac metastases are stable to slightly decreased in size. Electronically Signed   By: Titus Dubin M.D.   On: 05/07/2019 16:58   Mr Thoracic Spine W Wo Contrast  Result Date: 05/07/2019 CLINICAL DATA:  Urinary retention since 5 a.m. History of metastatic poorly differentiated neoplasm, possibly lung cancer. Currently on chemotherapy. EXAM: MRI CERVICAL, THORACIC AND  LUMBAR SPINE WITHOUT AND WITH CONTRAST TECHNIQUE: Multiplanar and multiecho pulse sequences of the cervical spine, to include the craniocervical junction and cervicothoracic junction, and thoracic and lumbar spine, were obtained without and with intravenous contrast. CONTRAST:  5 mL  Gadavist intravenous contrast. COMPARISON:  MRI lumbar spine dated March 21, 2019. CT chest, abdomen, and pelvis dated February 19, 2019. CT neck dated October 16, 2018. MRI cervical spine dated February 24, 2018. FINDINGS: MRI CERVICAL SPINE FINDINGS Alignment: New 2 mm anterolisthesis at C2-C3. Vertebrae: Enhancing expansile lesion in the C2 and C3 posterior elements is similar to CT neck from February, and significantly decreased in size since cervical spine MRI and June 2019. No new suspicious bone lesion. No fracture or evidence of discitis. Cord: Normal signal and morphology.  No intradural enhancement. Posterior Fossa, vertebral arteries, paraspinal tissues: Negative. Disc levels: C2-C3: Negative disc. Mild bilateral facet arthropathy. No stenosis. C3-C4: Unchanged small left paracentral disc protrusion. No stenosis. C4-C5: Unchanged mild disc bulging eccentric to the left and mild bilateral facet uncovertebral hypertrophy. No stenosis. C5-C6: Unchanged mild disc bulging and bilateral facet uncovertebral hypertrophy. Unchanged mild left neuroforaminal stenosis. No spinal canal or right neuroforaminal stenosis. C6-C7:  Unchanged small central disc protrusion.  No stenosis. C7-T1:  Negative. MRI THORACIC SPINE FINDINGS Alignment:  Physiologic. Vertebrae: No fracture, evidence of discitis, or bone lesion. Cord:  Normal signal and morphology.  No intradural enhancement. Paraspinal and other soft tissues: Stable radiation changes in the posterior right upper lobe. Slightly increased small right pleural effusion. New trace left pleural effusion. Disc levels: Small central disc protrusions and minimal disc bulging from T7-T8 through T11-T12. No  spinal canal or neuroforaminal stenosis. MRI LUMBAR SPINE FINDINGS Segmentation:  Standard. Alignment:  Unchanged mild retrolisthesis of L2. Vertebrae: Again seen is tumor involving L1, L2, and L3 vertebral bodies. Tumor involvement of the L3 vertebral body appears increased. Unchanged severe pathologic L2 compression fracture. Unchanged tumor involving the L2 posterior elements. Anterior extraosseous extension of tumor is grossly unchanged. Conus medullaris and cauda equina: Conus extends to the L1 level. Conus and cauda equina appear normal. No intradural enhancement. Paraspinal and other soft tissues: Right sacral ala and left posterior iliac metastases are stable to slightly decreased in size. Disc levels: T12-L1: Unchanged small left paracentral disc protrusion. No stenosis. L1-L2 and L2-L3: Circumferential epidural tumor, worse on the left, has progressed since the prior study, with worsening moderate to severe spinal canal stenosis and severe left lateral recess stenosis at both levels. Unchanged complete tumor infiltration of the left L1-L2 and L2-L3 neural foramina and moderate infiltration of the right L2-L3 neural foramen. L3-L4:  Negative. L4-L5:  Negative. L5-S1:  Negative. IMPRESSION: Cervical spine: 1. Grossly unchanged osseous metastatic disease involving the C2 and C3 posterior elements when compared to neck CT from February 2020. Lesions have significantly decreased in size since MRI cervical spine from June 2019. 2. No progressive metastatic disease. 3. Unchanged mild multilevel cervical spondylosis as described above. No high-grade stenosis. Thoracic spine: 1. No evidence of osseous metastatic disease. 2. Stable radiation changes in the posterior right upper lobe. 3. Slightly increased small right pleural effusion. New trace left pleural effusion. Lumbar spine: 1. Progressive bulky spinal and paraspinal tumor from L1 through L3, with greater involvement of the L3 vertebral body and increased  left-sided epidural tumor resulting in progressive moderate to severe spinal canal stenosis and severe left lateral recess stenosis at L1-L2 and L2-L3. 2. Grossly unchanged involvement of the left L1-L2 and bilateral L2-L3 neural foramina. 3. Stable pathologic compression fracture of L2 with mild retropulsion. 4. Right sacral ala and left posterior iliac metastases are stable to slightly decreased in size. Electronically Signed   By: Titus Dubin M.D.   On: 05/07/2019  16:58   Mr Lumbar Spine W Wo Contrast  Result Date: 05/07/2019 CLINICAL DATA:  Urinary retention since 5 a.m. History of metastatic poorly differentiated neoplasm, possibly lung cancer. Currently on chemotherapy. EXAM: MRI CERVICAL, THORACIC AND LUMBAR SPINE WITHOUT AND WITH CONTRAST TECHNIQUE: Multiplanar and multiecho pulse sequences of the cervical spine, to include the craniocervical junction and cervicothoracic junction, and thoracic and lumbar spine, were obtained without and with intravenous contrast. CONTRAST:  5 mL Gadavist intravenous contrast. COMPARISON:  MRI lumbar spine dated March 21, 2019. CT chest, abdomen, and pelvis dated February 19, 2019. CT neck dated October 16, 2018. MRI cervical spine dated February 24, 2018. FINDINGS: MRI CERVICAL SPINE FINDINGS Alignment: New 2 mm anterolisthesis at C2-C3. Vertebrae: Enhancing expansile lesion in the C2 and C3 posterior elements is similar to CT neck from February, and significantly decreased in size since cervical spine MRI and June 2019. No new suspicious bone lesion. No fracture or evidence of discitis. Cord: Normal signal and morphology.  No intradural enhancement. Posterior Fossa, vertebral arteries, paraspinal tissues: Negative. Disc levels: C2-C3: Negative disc. Mild bilateral facet arthropathy. No stenosis. C3-C4: Unchanged small left paracentral disc protrusion. No stenosis. C4-C5: Unchanged mild disc bulging eccentric to the left and mild bilateral facet uncovertebral hypertrophy. No  stenosis. C5-C6: Unchanged mild disc bulging and bilateral facet uncovertebral hypertrophy. Unchanged mild left neuroforaminal stenosis. No spinal canal or right neuroforaminal stenosis. C6-C7:  Unchanged small central disc protrusion.  No stenosis. C7-T1:  Negative. MRI THORACIC SPINE FINDINGS Alignment:  Physiologic. Vertebrae: No fracture, evidence of discitis, or bone lesion. Cord:  Normal signal and morphology.  No intradural enhancement. Paraspinal and other soft tissues: Stable radiation changes in the posterior right upper lobe. Slightly increased small right pleural effusion. New trace left pleural effusion. Disc levels: Small central disc protrusions and minimal disc bulging from T7-T8 through T11-T12. No spinal canal or neuroforaminal stenosis. MRI LUMBAR SPINE FINDINGS Segmentation:  Standard. Alignment:  Unchanged mild retrolisthesis of L2. Vertebrae: Again seen is tumor involving L1, L2, and L3 vertebral bodies. Tumor involvement of the L3 vertebral body appears increased. Unchanged severe pathologic L2 compression fracture. Unchanged tumor involving the L2 posterior elements. Anterior extraosseous extension of tumor is grossly unchanged. Conus medullaris and cauda equina: Conus extends to the L1 level. Conus and cauda equina appear normal. No intradural enhancement. Paraspinal and other soft tissues: Right sacral ala and left posterior iliac metastases are stable to slightly decreased in size. Disc levels: T12-L1: Unchanged small left paracentral disc protrusion. No stenosis. L1-L2 and L2-L3: Circumferential epidural tumor, worse on the left, has progressed since the prior study, with worsening moderate to severe spinal canal stenosis and severe left lateral recess stenosis at both levels. Unchanged complete tumor infiltration of the left L1-L2 and L2-L3 neural foramina and moderate infiltration of the right L2-L3 neural foramen. L3-L4:  Negative. L4-L5:  Negative. L5-S1:  Negative. IMPRESSION:  Cervical spine: 1. Grossly unchanged osseous metastatic disease involving the C2 and C3 posterior elements when compared to neck CT from February 2020. Lesions have significantly decreased in size since MRI cervical spine from June 2019. 2. No progressive metastatic disease. 3. Unchanged mild multilevel cervical spondylosis as described above. No high-grade stenosis. Thoracic spine: 1. No evidence of osseous metastatic disease. 2. Stable radiation changes in the posterior right upper lobe. 3. Slightly increased small right pleural effusion. New trace left pleural effusion. Lumbar spine: 1. Progressive bulky spinal and paraspinal tumor from L1 through L3, with greater involvement of the L3  vertebral body and increased left-sided epidural tumor resulting in progressive moderate to severe spinal canal stenosis and severe left lateral recess stenosis at L1-L2 and L2-L3. 2. Grossly unchanged involvement of the left L1-L2 and bilateral L2-L3 neural foramina. 3. Stable pathologic compression fracture of L2 with mild retropulsion. 4. Right sacral ala and left posterior iliac metastases are stable to slightly decreased in size. Electronically Signed   By: Titus Dubin M.D.   On: 05/07/2019 16:58    Pending Labs Unresulted Labs (From admission, onward)    Start     Ordered   05/08/19 0500  Comprehensive metabolic panel  Tomorrow morning,   R     05/07/19 1934   05/08/19 0500  CBC  Tomorrow morning,   R     05/07/19 1934   05/07/19 1934  HIV antibody (Routine Testing)  Add-on,   AD     05/07/19 1934   05/07/19 1934  Platelet count  Once,   STAT     05/07/19 1934   05/07/19 1821  SARS CORONAVIRUS 2 (TAT 6-12 HRS) Nasal Swab Aptima Multi Swab  (Asymptomatic/Tier 2 Patients Labs)  Once,   STAT    Question Answer Comment  Is this test for diagnosis or screening Screening   Symptomatic for COVID-19 as defined by CDC No   Hospitalized for COVID-19 No   Admitted to ICU for COVID-19 No   Previously tested for  COVID-19 No   Resident in a congregate (group) care setting No   Employed in healthcare setting No   Pregnant No      05/07/19 1820   05/07/19 0917  Urine culture  ONCE - STAT,   STAT     05/07/19 0916          Vitals/Pain Today's Vitals   05/07/19 1800 05/07/19 1815 05/07/19 1900 05/07/19 2021  BP: (!) 110/58  (!) 111/59 127/64  Pulse:  (!) 106 (!) 111 (!) 107  Resp:   17 18  Temp:      TempSrc:      SpO2:  100% 93% 96%  PainSc:        Isolation Precautions No active isolations  Medications Medications  dexamethasone (DECADRON) injection 6 mg (6 mg Intravenous Given 05/07/19 1854)  sodium chloride flush (NS) 0.9 % injection 3 mL (has no administration in time range)  acetaminophen (TYLENOL) tablet 650 mg (has no administration in time range)    Or  acetaminophen (TYLENOL) suppository 650 mg (has no administration in time range)  ondansetron (ZOFRAN) tablet 4 mg (has no administration in time range)    Or  ondansetron (ZOFRAN) injection 4 mg (has no administration in time range)  senna-docusate (Senokot-S) tablet 1 tablet (has no administration in time range)  fentaNYL (DURAGESIC) 75 MCG/HR 1 patch (has no administration in time range)  gabapentin (NEURONTIN) capsule 300 mg (has no administration in time range)  linaclotide (LINZESS) capsule 72 mcg (has no administration in time range)  pantoprazole (PROTONIX) EC tablet 40 mg (has no administration in time range)  oxyCODONE (Oxy IR/ROXICODONE) immediate release tablet 5 mg (has no administration in time range)  zolpidem (AMBIEN) tablet 10 mg (has no administration in time range)  morphine 2 MG/ML injection 1-2 mg (has no administration in time range)  gadobutrol (GADAVIST) 1 MMOL/ML injection 5 mL (5 mLs Intravenous Contrast Given 05/07/19 1501)    Mobility Walks with walker and assistance or uses wheelchair

## 2019-05-07 NOTE — ED Notes (Signed)
Patient transported to MRI 

## 2019-05-07 NOTE — ED Triage Notes (Addendum)
Patient reports urinary retention since 5 pm yesterday.   7/10 suprapubic pressure pain   Patient states she had issues with urination X4 days.   Denies n/v or diarrhea    A/Ox4 Wheelchair in triage.    A/ox4 Patient uses walker and wheelchair at home.   Patient reports falling yesterday. Denies trauma, loc, or hitting head.  Denies blood thinners.    Hx. Lung cancer

## 2019-05-07 NOTE — ED Provider Notes (Signed)
[facility] DEPT Provider Note   CSN: [id number] Arrival date & time: 05/07/19  5638     History   Chief Complaint Chief Complaint  Patient presents with  . Urinary Retention    HPI Rebecca Lucas is a 63 y.o. female.     The history is provided by the patient and medical records. No language interpreter was used.   Rebecca Lucas is a 63 y.o. female who presents to the Emergency Department complaining of urinary retention. She has a history of cancer, currently undergoing chemotherapy. She developed difficulty urinating about 4 to 5 days ago. She states it is difficult to initiate her stream and it seems to cut off midstream. She has not been able to urinate at all since 5 PM yesterday. She denies any fevers, nausea, vomiting. She does have bladder discomfort. No prior similar symptoms. 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 She has experienced weakness and numbness in her left leg since that time, which is unchanged. History reviewed. No pertinent past medical history.  Patient Active Problem List   Diagnosis Date Noted  . 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 Oral thrush 04/04/2018  . Goals of care, counseling/discussion 04/04/2018  . Encounter for antineoplastic chemotherapy 04/04/2018  . Encounter for antineoplastic immunotherapy 04/04/2018  . 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 Metastatic malignant neoplasm ([place]) 03/06/2018  . Primary cancer of right upper lobe of lung ([place]) 03/06/2018  . Bone metastasis ([place]) 03/06/2018    Past Surgical History:  Procedure Laterality Date  . IR IMAGING GUIDED PORT INSERTION  06/26/2018     OB History   No obstetric history on file.      Home Medications    Prior to Admission medications   Medication Sig Start Date End Date Taking? Authorizing Provider   acetaminophen (TYLENOL) 500 MG tablet Take 1,000 mg by mouth every 6 (six) hours as needed for mild pain.     [provider]  alendronate (FOSAMAX) 70 MG tablet Take 70 mg by mouth once a week. Wednesday 01/12/18   [provider]  butalbital-aspirin-caffeine Acquanetta Chain) 50-325-40 MG capsule Take 1 capsule by mouth every 6 (six) hours as needed for migraine.  12/05/17   [provider]  Calcium Carbonate-Vitamin D (CALCIUM-D PO) Take 1 tablet by mouth 2 (two) times daily.    [provider]  dexamethasone (DECADRON) 4 MG tablet Take 2 tablets twice a day (4 tablets total per day) the day before, the day of, and the day after chemotherapy 04/17/19   Heilingoetter, Cassandra L, PA-C  fentaNYL (DURAGESIC) 75 MCG/HR Place 1 patch onto the skin every 3 (three) days for 15 days. 05/02/19 05/17/19  Heilingoetter, Cassandra L, PA-C  fluconazole (DIFLUCAN) 100 MG tablet Take 1 tablet (100 mg total) by mouth daily. 04/26/19   Heilingoetter, Cassandra L, PA-C  gabapentin (NEURONTIN) 300 MG capsule Take 1 capsule (300 mg total) by mouth 3 (three) times daily. 05/02/19   Heilingoetter, Cassandra L, PA-C  ibuprofen (ADVIL,MOTRIN) 200 MG tablet Take 600 mg by mouth every 6 (six) hours as needed for mild pain.     [provider]  lidocaine-prilocaine (EMLA) cream Apply 1 application topically as needed. 06/13/18   Curt Bears, MD  linaclotide [facility]) 72 MCG capsule TAKE 1 CAPSULE BY MOUTH ONCE DAILY BEFORE BREAKFAST 05/02/19   Heilingoetter, Cassandra  L, PA-C  LUTEIN PO Take 1 tablet by mouth daily.    [provider]  omeprazole (PRILOSEC) 20 MG capsule TAKE 1 CAPSULE (20 MG TOTAL) BY MOUTH DAILY. 04/08/19   Curt Bears, MD  ondansetron (ZOFRAN) 8 MG tablet TAKE 1 TABLET (8 MG TOTAL) BY MOUTH EVERY 8 HOURS AS NEEDED FOR NAUSEA OR VOMITING. 03/20/19   Curt Bears, MD  oxyCODONE (OXY IR/ROXICODONE) 5 MG immediate release tablet Take 1 tablet (5 mg total) by  mouth every 6 (six) hours as needed for severe pain. 05/02/19   Heilingoetter, Cassandra L, PA-C  traMADol (ULTRAM) 50 MG tablet Take 1 tablet (50 mg total) by mouth every 6 (six) hours as needed for moderate pain. 03/06/19   Curt Bears, MD  zolpidem (AMBIEN) 10 MG tablet Take 1 tablet (10 mg total) by mouth at bedtime as needed for sleep. 03/21/18   Curt Bears, MD    Family History Family History  Problem Relation Age of Onset  . ALS Mother 93       non genetic  . Cancer Neg Hx     Social History Social History   Tobacco Use  . Smoking status: Never Smoker  . Smokeless tobacco: Never Used  Substance Use Topics  . Alcohol use: Yes    Comment: occasionally  . Drug use: Never     Allergies   Patient has no known allergies.   Review of Systems Review of Systems  All other systems reviewed and are negative.    Physical Exam Updated Vital Signs BP (!) 103/57 (BP Location: Right Arm)   Pulse (!) 120   Temp 99 F (37.2 C) (Oral)   Resp 17   SpO2 100%   Physical Exam Vitals signs and nursing note reviewed.  Constitutional:      Appearance: She is well-developed.  HENT:     Head: Normocephalic and atraumatic.  Cardiovascular:     Rate and Rhythm: Regular rhythm. Tachycardia present.     Heart sounds: No murmur.  Pulmonary:     Effort: Pulmonary effort is normal. No respiratory distress.     Breath sounds: Normal breath sounds.  Abdominal:     General: There is distension.     Tenderness: There is no guarding or rebound.     Comments: Distention and tenderness over the lower abdomen to the level of the umbilicus.  Musculoskeletal:     Comments: 2 to 3+ pitting edema to the left lower extremity.  Skin:    General: Skin is warm and dry.  Neurological:     Mental Status: She is alert and oriented to person, place, and time.     Comments: 3/5 strength in the left lower extremity. Altered sensation to light touch over the proximal left lower extremity   Psychiatric:        Behavior: Behavior normal.      ED Treatments / Results  Labs (all labs ordered are listed, but only abnormal results are displayed) Labs Reviewed  URINE CULTURE  BASIC METABOLIC PANEL  CBC WITH DIFFERENTIAL/PLATELET  URINALYSIS, ROUTINE W REFLEX MICROSCOPIC    EKG None  Radiology No results found.  Procedures Procedures (including critical care time)  Medications Ordered in ED Medications - No data to display   Initial Impression / Assessment and Plan / ED Course  I have reviewed the triage vital signs and the nursing notes.  Pertinent labs & imaging results that were available during my care of the patient were reviewed by  me and considered in my medical decision making (see chart for details).       Patient with history of metastatic cancer here for evaluation of urinary retention. She does have altered sensation to light touch, weakness to the left upper extremity as well as urinary retention. UA is not consistent with UTI. In setting of urinary retention plan to obtain MRI cervical, thoracic and lumbar spine to look for recurrent disease. Patient care transferred pending MRI. Final Clinical Impressions(s) / ED Diagnoses   Final diagnoses:  None    ED Discharge Orders    None       Quintella Reichert, MD 05/07/19 1506

## 2019-05-07 NOTE — ED Notes (Signed)
Pt is alert and oriented x 4 and is verbally responsive. Pt spouse is at bedside. Pt has 7/10 abdominal pressure, abdomen is tender with touch x 4 quads with abdominal  Distention.  Pt reports that she has not voided since 5 pm yesterday. Pt bladder scanned and showed approx. 588 ml of urine retention, a  31 F foley cath  Was placed  And yielded > 657ml of urinary out put. Pt reports relief 0/10 pain at this time. Pt does also have left foot 3+ pitting edema.

## 2019-05-07 NOTE — ED Provider Notes (Addendum)
  Physical Exam  BP (!) 111/58   Pulse (!) 102   Temp 99 F (37.2 C) (Oral)   Resp 17   SpO2 99%   Physical Exam  ED Course/Procedures     .Critical Care Performed by: Varney Biles, MD Authorized by: Varney Biles, MD   Critical care provider statement:    Critical care time (minutes):  30   Critical care was necessary to treat or prevent imminent or life-threatening deterioration of the following conditions:  CNS failure or compromise (ONCOLOGIC EMERGENCY)   Critical care was time spent personally by me on the following activities:  Discussions with consultants, evaluation of patient's response to treatment, examination of patient, ordering and performing treatments and interventions, ordering and review of laboratory studies, ordering and review of radiographic studies, pulse oximetry, re-evaluation of patient's condition, obtaining history from patient or surrogate and review of old charts    MDM   Assuming care of patient from Dr. Ralene Bathe.   Patient in the ED for urinary retention. No back pain. Workup thus far shows elevated white count and bandemia which is chronic. 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 Foley is in place for PVUR > 500 cc.  Concerning findings are as following: none. Important pending results are MRI.  According to Dr. Ralene Bathe, plan is to f.u with Dr. Rogue Jury once the MRI results.  Patient had no complains, no concerns from the nursing side. Will continue to monitor.   Varney Biles, MD 05/07/19 1519  6:50 PM MRI results reviewed. MRI results discussed with the patient and her husband.  It appears that there is encasement of the tumor within the epidural space in the lumbar spine, affecting the spinal canal.  No extension of the tumor into the spinal cord itself it seems like.  Likely the symptoms can be attributed to this extension of the tumor.  I discussed the case with Dr. Julien Nordmann.  He informs me that he would like [place]  to consult neurosurgery to see if they have anything to offer.  Patient's last radiation was few weeks ago now.  Medicine to admit. Dr. Julien Nordmann recommended that we start Decadron 6 mg every 6, which has been initiated.  Neurosurgery team has called me back.  The APP informed me that they will review the MRI with the neurosurgeon, and that the patient might need to go to [facility] for admission.      Varney Biles, MD 05/07/19 [phone]

## 2019-05-07 NOTE — Consult Note (Addendum)
Chief Complaint   Chief Complaint  Patient presents with   Urinary Retention    HPI   Consult requested by: Dr Kathrynn Humble Reason for consult: lumbar epidural tumor, acute urinary retention  HPI: Rebecca Lucas is a 63 y.o. 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 She is followed by Dr Julien Nordmann (oncology). She is s/p palliative radiation to spine in May 2020 with Dr Vertell Limber & remains on chemotherapy. She presented to [facility] ED due to urinary retention. Describes inability to urinate starting yesterday evening around 5pm. She still hadn't urinated today and because of abd discomfort came to ED. Foley placed by ED personnel. She has chronic low back pain, LLE weakness and N/T in LLE. No changes acutely associated with urinary retention. Denies RLE symptoms. Complete MRI spine obtained by EDP. Primary findings are in the lumbar spine where there is worsening lumbar epidural tumor. NSY consultation requested.  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 No pertinent past medical history.  PSH: Past Surgical History:  Procedure Laterality Date   IR IMAGING GUIDED PORT INSERTION  06/26/2018    (Not in a hospital admission)   SH: Social History   Tobacco Use    Smoking status: Never Smoker   Smokeless tobacco: Never Used  Substance Use Topics   Alcohol use: Yes    Comment: occasionally   Drug use: Never    MEDS: Prior to Admission medications   Medication Sig Start Date End Date Taking? Authorizing Provider  acetaminophen (TYLENOL) 500 MG tablet Take 1,000 mg by mouth every 6 (six) hours as needed for mild pain.    Yes [provider]  butalbital-aspirin-caffeine Acquanetta Chain) 50-325-40 MG capsule Take 1 capsule by mouth every 6 (six) hours as needed for migraine.  12/05/17  Yes [provider]  Calcium Carbonate-Vitamin D (CALCIUM-D PO) Take 1 tablet by mouth 2 (two) times daily.   Yes [provider]  dexamethasone (DECADRON) 4 MG tablet Take 2 tablets twice a day (4 tablets total per day) the day before, the day of, and the day after chemotherapy 04/17/19  Yes Heilingoetter, Cassandra L, PA-C  fentaNYL (DURAGESIC) 75 MCG/HR Place 1 patch onto the skin every 3 (three) days for 15 days. 05/02/19 05/17/19 Yes Heilingoetter, Cassandra L, PA-C  gabapentin (NEURONTIN) 300 MG capsule Take 1 capsule (300 mg total) by mouth 3 (three) times daily. 05/02/19  Yes Heilingoetter, Cassandra L, PA-C  ibuprofen (ADVIL,MOTRIN) 200 MG tablet Take 600 mg by mouth every 6 (six) hours as needed for mild pain.    Yes [provider]  lidocaine-prilocaine (EMLA) cream Apply 1 application topically as needed. Patient taking differently: Apply 1 application topically as needed (pain).  06/13/18  Yes Curt Bears, MD  linaclotide [facility]) 72 MCG capsule TAKE 1 CAPSULE BY MOUTH ONCE DAILY BEFORE BREAKFAST Patient taking differently:  Take 72 mcg by mouth daily before breakfast.  05/02/19  Yes Heilingoetter, Cassandra L, PA-C  LUTEIN PO Take 1 tablet by mouth daily.   Yes [provider]  omeprazole (PRILOSEC) 20 MG capsule TAKE 1 CAPSULE (20 MG TOTAL) BY MOUTH DAILY. 04/08/19  Yes Curt Bears, MD  ondansetron (ZOFRAN) 8 MG  tablet TAKE 1 TABLET (8 MG TOTAL) BY MOUTH EVERY 8 HOURS AS NEEDED FOR NAUSEA OR VOMITING. Patient taking differently: Take 8 mg by mouth every 8 (eight) hours as needed for nausea or vomiting.  03/20/19  Yes Curt Bears, MD  oxyCODONE (OXY IR/ROXICODONE) 5 MG immediate release tablet Take 1 tablet (5 mg total) by mouth every 6 (six) hours as needed for severe pain. 05/02/19  Yes Heilingoetter, Cassandra L, PA-C  traMADol (ULTRAM) 50 MG tablet Take 1 tablet (50 mg total) by mouth every 6 (six) hours as needed for moderate pain. 03/06/19  Yes Curt Bears, MD  zolpidem (AMBIEN) 10 MG tablet Take 1 tablet (10 mg total) by mouth at bedtime as needed for sleep. 03/21/18  Yes Curt Bears, MD    ALLERGY: No Known Allergies  Social History   Tobacco Use   Smoking status: Never Smoker   Smokeless tobacco: Never Used  Substance Use Topics   Alcohol use: Yes    Comment: occasionally     Family History  Problem Relation Age of Onset   ALS Mother 39       non genetic   Cancer Neg Hx      ROS   Review of Systems  Genitourinary:       Urinary retention  Musculoskeletal: Positive for back pain, falls (yesterday, no injury) and myalgias.  Neurological: Positive for tingling (LLE), sensory change (LLE), focal weakness (LLE) and weakness. Negative for dizziness, tremors, speech change, seizures, loss of consciousness and headaches.    Exam   Vitals:   05/07/19 1000 05/07/19 1646  BP: (!) 111/58 (!) 107/54  Pulse: (!) 102 (!) 109  Resp:  17  Temp:    SpO2: 99% 97%   General appearance: WDWN, NAD Eyes: No scleral injection Musculoskeletal:     Muscle tone upper extremities: Normal    Muscle tone lower extremities: Normal    Motor exam:  Lower Extremity IP Quad PF DF EHL  Right 5/5 5/5 5/5 5/5 5/5  Left 1/5 2/5 3/5 0/5 0/5   Neurological Mental Status:    - Patient is awake, alert, oriented to person, place, month, year, and situation    - Patient is able to give  a clear and coherent history.    - No signs of aphasia or neglect Cranial Nerves: grossly normal  Sensory: Sensation decreased to LT LLE  Results - Imaging/Labs   Results for orders placed or performed during the hospital encounter of 05/07/19 (from the past 48 hour(s))  CBC with Differential     Status: Abnormal   Collection Time: 05/07/19 10:05 AM  Result Value Ref Range   WBC 14.7 (H) 4.0 - 10.5 K/uL   RBC 2.79 (L) 3.87 - 5.11 MIL/uL   Hemoglobin 8.8 (L) 12.0 - 15.0 g/dL   HCT 28.3 (L) 36.0 - 46.0 %   MCV 101.4 (H) 80.0 - 100.0 fL   MCH 31.5 26.0 - 34.0 pg   MCHC 31.1 30.0 - 36.0 g/dL   RDW 17.2 (H) 11.5 - 15.5 %   Platelets PLATELET CLUMPS NOTED ON SMEAR, UNABLE TO ESTIMATE 150 - 400 K/uL  Comment: Immature Platelet Fraction may be clinically indicated, consider ordering this additional test [id number]    nRBC 0.0 0.0 - 0.2 %   Neutrophils Relative % 82 %   Neutro Abs 13.1 (H) 1.7 - 7.7 K/uL   Band Neutrophils 7 %   Lymphocytes Relative 2 %   Lymphs Abs 0.3 (L) 0.7 - 4.0 K/uL   Monocytes Relative 3 %   Monocytes Absolute 0.4 0.1 - 1.0 K/uL   Eosinophils Relative 0 %   Eosinophils Absolute 0.0 0.0 - 0.5 K/uL   Basophils Relative 0 %   Basophils Absolute 0.0 0.0 - 0.1 K/uL   WBC Morphology      MODERATE LEFT SHIFT (>5% METAS AND MYELOS,OCC PRO NOTED)    Comment: TOXIC GRANULATION   Metamyelocytes Relative 1 %   Myelocytes 5 %   Abs Immature Granulocytes 0.90 (H) 0.00 - 0.07 K/uL   Polychromasia PRESENT     Comment: Performed at [facility], [place] [address], [place], [place] [address]  Urinalysis, Routine w reflex microscopic     Status: Abnormal   Collection Time: 05/07/19 10:05 AM  Result Value Ref Range   Color, Urine YELLOW YELLOW   APPearance CLEAR CLEAR   Specific Gravity, Urine 1.013 1.005 - 1.030   pH 6.0 5.0 - 8.0   Glucose, UA NEGATIVE NEGATIVE mg/dL   Hgb urine dipstick SMALL (A) NEGATIVE   Bilirubin Urine NEGATIVE NEGATIVE    Ketones, ur NEGATIVE NEGATIVE mg/dL   Protein, ur 30 (A) NEGATIVE mg/dL   Nitrite NEGATIVE NEGATIVE   Leukocytes,Ua NEGATIVE NEGATIVE   RBC / HPF 11-20 0 - 5 RBC/hpf   WBC, UA 0-5 0 - 5 WBC/hpf   Bacteria, UA RARE (A) NONE SEEN   Squamous Epithelial / LPF 0-5 0 - 5   Mucus PRESENT    Hyaline Casts, UA PRESENT     Comment: Performed at [facility], [place] [address] [place], [place] [address]  Comprehensive metabolic panel     Status: Abnormal   Collection Time: 05/07/19 10:05 AM  Result Value Ref Range   Sodium 136 135 - 145 mmol/L   Potassium 4.0 3.5 - 5.1 mmol/L   Chloride 104 98 - 111 mmol/L   CO2 24 22 - 32 mmol/L   Glucose, Bld 124 (H) 70 - 99 mg/dL   BUN 21 8 - 23 mg/dL   Creatinine, Ser 0.73 0.44 - 1.00 mg/dL   Calcium 8.8 (L) 8.9 - 10.3 mg/dL   Total Protein 5.6 (L) 6.5 - 8.1 g/dL   Albumin 2.5 (L) 3.5 - 5.0 g/dL   AST 47 (H) 15 - 41 U/L   ALT 79 (H) 0 - 44 U/L   Alkaline Phosphatase 200 (H) 38 - 126 U/L   Total Bilirubin <0.1 (L) 0.3 - 1.2 mg/dL    Comment: REPEATED TO VERIFY   GFR calc non Af Amer >60 >60 mL/min   GFR calc Af Amer >60 >60 mL/min   Anion gap 8 5 - 15    Comment: Performed at [facility], [place] [address], [place],  [address]    Mr Cervical Spine W Or Wo Contrast  Result Date: 05/07/2019 CLINICAL DATA:  Urinary retention since 5 a.m. History of metastatic poorly differentiated neoplasm, possibly lung cancer. Currently on chemotherapy. EXAM: MRI CERVICAL, THORACIC AND LUMBAR SPINE WITHOUT AND WITH CONTRAST TECHNIQUE: Multiplanar and multiecho pulse sequences of the cervical spine, to include the craniocervical junction and cervicothoracic junction,  and thoracic and lumbar spine, were obtained without and with intravenous contrast. CONTRAST:  5 mL Gadavist intravenous contrast. COMPARISON:  MRI lumbar spine dated March 21, 2019. CT chest, abdomen, and pelvis dated February 19, 2019. CT neck dated October 16, 2018. MRI  cervical spine dated February 24, 2018. FINDINGS: MRI CERVICAL SPINE FINDINGS Alignment: New 2 mm anterolisthesis at C2-C3. Vertebrae: Enhancing expansile lesion in the C2 and C3 posterior elements is similar to CT neck from February, and significantly decreased in size since cervical spine MRI and June 2019. No new suspicious bone lesion. No fracture or evidence of discitis. Cord: Normal signal and morphology.  No intradural enhancement. Posterior Fossa, vertebral arteries, paraspinal tissues: Negative. Disc levels: C2-C3: Negative disc. Mild bilateral facet arthropathy. No stenosis. C3-C4: Unchanged small left paracentral disc protrusion. No stenosis. C4-C5: Unchanged mild disc bulging eccentric to the left and mild bilateral facet uncovertebral hypertrophy. No stenosis. C5-C6: Unchanged mild disc bulging and bilateral facet uncovertebral hypertrophy. Unchanged mild left neuroforaminal stenosis. No spinal canal or right neuroforaminal stenosis. C6-C7:  Unchanged small central disc protrusion.  No stenosis. C7-T1:  Negative. MRI THORACIC SPINE FINDINGS Alignment:  Physiologic. Vertebrae: No fracture, evidence of discitis, or bone lesion. Cord:  Normal signal and morphology.  No intradural enhancement. Paraspinal and other soft tissues: Stable radiation changes in the posterior right upper lobe. Slightly increased small right pleural effusion. New trace left pleural effusion. Disc levels: Small central disc protrusions and minimal disc bulging from T7-T8 through T11-T12. No spinal canal or neuroforaminal stenosis. MRI LUMBAR SPINE FINDINGS Segmentation:  Standard. Alignment:  Unchanged mild retrolisthesis of L2. Vertebrae: Again seen is tumor involving L1, L2, and L3 vertebral bodies. Tumor involvement of the L3 vertebral body appears increased. Unchanged severe pathologic L2 compression fracture. Unchanged tumor involving the L2 posterior elements. Anterior extraosseous extension of tumor is grossly unchanged. Conus  medullaris and cauda equina: Conus extends to the L1 level. Conus and cauda equina appear normal. No intradural enhancement. Paraspinal and other soft tissues: Right sacral ala and left posterior iliac metastases are stable to slightly decreased in size. Disc levels: T12-L1: Unchanged small left paracentral disc protrusion. No stenosis. L1-L2 and L2-L3: Circumferential epidural tumor, worse on the left, has progressed since the prior study, with worsening moderate to severe spinal canal stenosis and severe left lateral recess stenosis at both levels. Unchanged complete tumor infiltration of the left L1-L2 and L2-L3 neural foramina and moderate infiltration of the right L2-L3 neural foramen. L3-L4:  Negative. L4-L5:  Negative. L5-S1:  Negative. IMPRESSION: Cervical spine: 1. Grossly unchanged osseous metastatic disease involving the C2 and C3 posterior elements when compared to neck CT from February 2020. Lesions have significantly decreased in size since MRI cervical spine from June 2019. 2. No progressive metastatic disease. 3. Unchanged mild multilevel cervical spondylosis as described above. No high-grade stenosis. Thoracic spine: 1. No evidence of osseous metastatic disease. 2. Stable radiation changes in the posterior right upper lobe. 3. Slightly increased small right pleural effusion. New trace left pleural effusion. Lumbar spine: 1. Progressive bulky spinal and paraspinal tumor from L1 through L3, with greater involvement of the L3 vertebral body and increased left-sided epidural tumor resulting in progressive moderate to severe spinal canal stenosis and severe left lateral recess stenosis at L1-L2 and L2-L3. 2. Grossly unchanged involvement of the left L1-L2 and bilateral L2-L3 neural foramina. 3. Stable pathologic compression fracture of L2 with mild retropulsion. 4. Right sacral ala and left posterior iliac metastases are stable to slightly  decreased in size. Electronically Signed   By: Titus Dubin  M.D.   On: 05/07/2019 16:58   Mr Thoracic Spine W Wo Contrast  Result Date: 05/07/2019 CLINICAL DATA:  Urinary retention since 5 a.m. History of metastatic poorly differentiated neoplasm, possibly lung cancer. Currently on chemotherapy. EXAM: MRI CERVICAL, THORACIC AND LUMBAR SPINE WITHOUT AND WITH CONTRAST TECHNIQUE: Multiplanar and multiecho pulse sequences of the cervical spine, to include the craniocervical junction and cervicothoracic junction, and thoracic and lumbar spine, were obtained without and with intravenous contrast. CONTRAST:  5 mL Gadavist intravenous contrast. COMPARISON:  MRI lumbar spine dated March 21, 2019. CT chest, abdomen, and pelvis dated February 19, 2019. CT neck dated October 16, 2018. MRI cervical spine dated February 24, 2018. FINDINGS: MRI CERVICAL SPINE FINDINGS Alignment: New 2 mm anterolisthesis at C2-C3. Vertebrae: Enhancing expansile lesion in the C2 and C3 posterior elements is similar to CT neck from February, and significantly decreased in size since cervical spine MRI and June 2019. No new suspicious bone lesion. No fracture or evidence of discitis. Cord: Normal signal and morphology.  No intradural enhancement. Posterior Fossa, vertebral arteries, paraspinal tissues: Negative. Disc levels: C2-C3: Negative disc. Mild bilateral facet arthropathy. No stenosis. C3-C4: Unchanged small left paracentral disc protrusion. No stenosis. C4-C5: Unchanged mild disc bulging eccentric to the left and mild bilateral facet uncovertebral hypertrophy. No stenosis. C5-C6: Unchanged mild disc bulging and bilateral facet uncovertebral hypertrophy. Unchanged mild left neuroforaminal stenosis. No spinal canal or right neuroforaminal stenosis. C6-C7:  Unchanged small central disc protrusion.  No stenosis. C7-T1:  Negative. MRI THORACIC SPINE FINDINGS Alignment:  Physiologic. Vertebrae: No fracture, evidence of discitis, or bone lesion. Cord:  Normal signal and morphology.  No intradural enhancement.  Paraspinal and other soft tissues: Stable radiation changes in the posterior right upper lobe. Slightly increased small right pleural effusion. New trace left pleural effusion. Disc levels: Small central disc protrusions and minimal disc bulging from T7-T8 through T11-T12. No spinal canal or neuroforaminal stenosis. MRI LUMBAR SPINE FINDINGS Segmentation:  Standard. Alignment:  Unchanged mild retrolisthesis of L2. Vertebrae: Again seen is tumor involving L1, L2, and L3 vertebral bodies. Tumor involvement of the L3 vertebral body appears increased. Unchanged severe pathologic L2 compression fracture. Unchanged tumor involving the L2 posterior elements. Anterior extraosseous extension of tumor is grossly unchanged. Conus medullaris and cauda equina: Conus extends to the L1 level. Conus and cauda equina appear normal. No intradural enhancement. Paraspinal and other soft tissues: Right sacral ala and left posterior iliac metastases are stable to slightly decreased in size. Disc levels: T12-L1: Unchanged small left paracentral disc protrusion. No stenosis. L1-L2 and L2-L3: Circumferential epidural tumor, worse on the left, has progressed since the prior study, with worsening moderate to severe spinal canal stenosis and severe left lateral recess stenosis at both levels. Unchanged complete tumor infiltration of the left L1-L2 and L2-L3 neural foramina and moderate infiltration of the right L2-L3 neural foramen. L3-L4:  Negative. L4-L5:  Negative. L5-S1:  Negative. IMPRESSION: Cervical spine: 1. Grossly unchanged osseous metastatic disease involving the C2 and C3 posterior elements when compared to neck CT from February 2020. Lesions have significantly decreased in size since MRI cervical spine from June 2019. 2. No progressive metastatic disease. 3. Unchanged mild multilevel cervical spondylosis as described above. No high-grade stenosis. Thoracic spine: 1. No evidence of osseous metastatic disease. 2. Stable radiation  changes in the posterior right upper lobe. 3. Slightly increased small right pleural effusion. New trace left pleural effusion. Lumbar spine:  1. Progressive bulky spinal and paraspinal tumor from L1 through L3, with greater involvement of the L3 vertebral body and increased left-sided epidural tumor resulting in progressive moderate to severe spinal canal stenosis and severe left lateral recess stenosis at L1-L2 and L2-L3. 2. Grossly unchanged involvement of the left L1-L2 and bilateral L2-L3 neural foramina. 3. Stable pathologic compression fracture of L2 with mild retropulsion. 4. Right sacral ala and left posterior iliac metastases are stable to slightly decreased in size. Electronically Signed   By: Titus Dubin M.D.   On: 05/07/2019 16:58   Mr Lumbar Spine W Wo Contrast  Result Date: 05/07/2019 CLINICAL DATA:  Urinary retention since 5 a.m. History of metastatic poorly differentiated neoplasm, possibly lung cancer. Currently on chemotherapy. EXAM: MRI CERVICAL, THORACIC AND LUMBAR SPINE WITHOUT AND WITH CONTRAST TECHNIQUE: Multiplanar and multiecho pulse sequences of the cervical spine, to include the craniocervical junction and cervicothoracic junction, and thoracic and lumbar spine, were obtained without and with intravenous contrast. CONTRAST:  5 mL Gadavist intravenous contrast. COMPARISON:  MRI lumbar spine dated March 21, 2019. CT chest, abdomen, and pelvis dated February 19, 2019. CT neck dated October 16, 2018. MRI cervical spine dated February 24, 2018. FINDINGS: MRI CERVICAL SPINE FINDINGS Alignment: New 2 mm anterolisthesis at C2-C3. Vertebrae: Enhancing expansile lesion in the C2 and C3 posterior elements is similar to CT neck from February, and significantly decreased in size since cervical spine MRI and June 2019. No new suspicious bone lesion. No fracture or evidence of discitis. Cord: Normal signal and morphology.  No intradural enhancement. Posterior Fossa, vertebral arteries, paraspinal tissues:  Negative. Disc levels: C2-C3: Negative disc. Mild bilateral facet arthropathy. No stenosis. C3-C4: Unchanged small left paracentral disc protrusion. No stenosis. C4-C5: Unchanged mild disc bulging eccentric to the left and mild bilateral facet uncovertebral hypertrophy. No stenosis. C5-C6: Unchanged mild disc bulging and bilateral facet uncovertebral hypertrophy. Unchanged mild left neuroforaminal stenosis. No spinal canal or right neuroforaminal stenosis. C6-C7:  Unchanged small central disc protrusion.  No stenosis. C7-T1:  Negative. MRI THORACIC SPINE FINDINGS Alignment:  Physiologic. Vertebrae: No fracture, evidence of discitis, or bone lesion. Cord:  Normal signal and morphology.  No intradural enhancement. Paraspinal and other soft tissues: Stable radiation changes in the posterior right upper lobe. Slightly increased small right pleural effusion. New trace left pleural effusion. Disc levels: Small central disc protrusions and minimal disc bulging from T7-T8 through T11-T12. No spinal canal or neuroforaminal stenosis. MRI LUMBAR SPINE FINDINGS Segmentation:  Standard. Alignment:  Unchanged mild retrolisthesis of L2. Vertebrae: Again seen is tumor involving L1, L2, and L3 vertebral bodies. Tumor involvement of the L3 vertebral body appears increased. Unchanged severe pathologic L2 compression fracture. Unchanged tumor involving the L2 posterior elements. Anterior extraosseous extension of tumor is grossly unchanged. Conus medullaris and cauda equina: Conus extends to the L1 level. Conus and cauda equina appear normal. No intradural enhancement. Paraspinal and other soft tissues: Right sacral ala and left posterior iliac metastases are stable to slightly decreased in size. Disc levels: T12-L1: Unchanged small left paracentral disc protrusion. No stenosis. L1-L2 and L2-L3: Circumferential epidural tumor, worse on the left, has progressed since the prior study, with worsening moderate to severe spinal canal  stenosis and severe left lateral recess stenosis at both levels. Unchanged complete tumor infiltration of the left L1-L2 and L2-L3 neural foramina and moderate infiltration of the right L2-L3 neural foramen. L3-L4:  Negative. L4-L5:  Negative. L5-S1:  Negative. IMPRESSION: Cervical spine: 1. Grossly unchanged osseous metastatic disease  involving the C2 and C3 posterior elements when compared to neck CT from February 2020. Lesions have significantly decreased in size since MRI cervical spine from June 2019. 2. No progressive metastatic disease. 3. Unchanged mild multilevel cervical spondylosis as described above. No high-grade stenosis. Thoracic spine: 1. No evidence of osseous metastatic disease. 2. Stable radiation changes in the posterior right upper lobe. 3. Slightly increased small right pleural effusion. New trace left pleural effusion. Lumbar spine: 1. Progressive bulky spinal and paraspinal tumor from L1 through L3, with greater involvement of the L3 vertebral body and increased left-sided epidural tumor resulting in progressive moderate to severe spinal canal stenosis and severe left lateral recess stenosis at L1-L2 and L2-L3. 2. Grossly unchanged involvement of the left L1-L2 and bilateral L2-L3 neural foramina. 3. Stable pathologic compression fracture of L2 with mild retropulsion. 4. Right sacral ala and left posterior iliac metastases are stable to slightly decreased in size. Electronically Signed   By: Titus Dubin M.D.   On: 05/07/2019 16:58   Impression/Plan   63 y.o. 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 Imaging reviewed with Dr Kathyrn Sheriff. Patient will need to undergo L2 laminectomy for decompression with stabilization from T11-L5.  Plan to tentatively do this tomorrow afternoon. Briefly discussed procedure with patient and her husband at bedside. They state understanding and wish to proceed. - NPO at midnight - Decadron per oncology - CT TL spine mazoor protocol for surgical planning.  Dr Kathyrn Sheriff to follow up tomorrow am to discuss further with husband and wife.  Ferne Reus, PA-C [facility] [employer]

## 2019-05-07 NOTE — ED Notes (Signed)
Attempted to call report again, nurse states she will call back when her med pass is complete. Made aware [facility] will be on the way.

## 2019-05-07 NOTE — ED Notes (Signed)
Attempted to call report, nurse unable to take report at this time.

## 2019-05-07 NOTE — ED Notes (Signed)
[facility] called for transport. 

## 2019-05-08 ENCOUNTER — Inpatient Hospital Stay (HOSPITAL_COMMUNITY): Payer: 59

## 2019-05-08 DIAGNOSIS — G893 Neoplasm related pain (acute) (chronic): Secondary | ICD-10-CM

## 2019-05-08 DIAGNOSIS — R338 Other retention of urine: Secondary | ICD-10-CM

## 2019-05-08 LAB — SARS CORONAVIRUS 2 (TAT 6-24 HRS): SARS Coronavirus 2: NEGATIVE

## 2019-05-08 LAB — URINE CULTURE: Culture: NO GROWTH

## 2019-05-08 LAB — GLUCOSE, CAPILLARY: Glucose-Capillary: 109 mg/dL — ABNORMAL HIGH (ref 70–99)

## 2019-05-08 LAB — CBC
HCT: 25.4 % — ABNORMAL LOW (ref 36.0–46.0)
Hemoglobin: 8.2 g/dL — ABNORMAL LOW (ref 12.0–15.0)
MCH: 31.4 pg (ref 26.0–34.0)
MCHC: 32.3 g/dL (ref 30.0–36.0)
MCV: 97.3 fL (ref 80.0–100.0)
Platelets: 160 10*3/uL (ref 150–400)
RBC: 2.61 MIL/uL — ABNORMAL LOW (ref 3.87–5.11)
RDW: 16.7 % — ABNORMAL HIGH (ref 11.5–15.5)
WBC: 24.1 10*3/uL — ABNORMAL HIGH (ref 4.0–10.5)
nRBC: 0 % (ref 0.0–0.2)

## 2019-05-08 LAB — HIV ANTIBODY (ROUTINE TESTING W REFLEX): HIV Screen 4th Generation wRfx: NONREACTIVE

## 2019-05-08 LAB — ABO/RH: ABO/RH(D): O NEG

## 2019-05-08 MED ORDER — SODIUM CHLORIDE 0.9% IV SOLUTION
Freq: Once | INTRAVENOUS | Status: AC
  Administered 2019-05-08: 13:00:00 via INTRAVENOUS

## 2019-05-08 NOTE — Progress Notes (Signed)
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  Molecular studies by [facility] 1showed no actionable mutations. PDL 1 expression 99%  NARRATIVE:  Dr. Tammi Klippel and I were informed of this patient's recent admission for AUR secondary to disease progression in the lumbar spine and have reviewed this patient's most recent findings and wanted to take a minute to document our recommendations.  Rebecca Lucas is well known to our service, having had multiple courses of palliative radiation for her metastatic disease with her most recent treatments being SBRT x5 (40 Gy) to the metastatic disease with epidural involvement at the L1-L3 level and 30 Gy in 5 fractions to thebilateralsacroiliac metastases in 01/2019 followed by an additional 30 Gy in 10 fxs of conventional palliative radiotherapy to progressive disease at L1-L3 which was just completed 04/18/2019.  Rebecca Lucas had significant improvement in her low back pain following radiation treatments but continued with persistent LLE weakness and paraesthesias.  Rebecca Lucas reports that the left lower extremity weakness and paresthesias have been gradually progressive over the past week but more recently, Rebecca Lucas developed acute urinary retention which prompted emergency evaluation and admission on 05/07/2019.  Total spine MRI imaging was performed on admission which revealed stable to improved previously treated metastatic disease in the cervical spine, no metastatic lesions in the thoracic spine but increased circumferential epidural  tumor at L1-L2 and L2-L3 with increased tumor involvement of the L3 vertebral body which appears refractory to her recent radiotherapy.  There is worsening moderate to severe spinal canal stenosis and severe left lateral recess stenosis at both levels.  Neurosurgery has been consulted and Rebecca Lucas has met with Dr. Kathyrn Sheriff who plans to take her to surgery later today or tomorrow for surgical decompression/tumor debulking and spinal stabilization.  Of note, the patient's systemic therapy with maintenance Alimta and Keytruda was recently discontinued due to evidence of disease progression in the spine and Rebecca Lucas was recently started on a new regimen of palliative systemic chemotherapy with docetaxel and Cyramza with her first dose on 04/24/2019 which Rebecca Lucas tolerated well.  Rebecca Lucas continues in close follow-up under the care and direction of Dr. Earlie Server for management of her systemic disease.  Lab Findings: Lab Results  Component Value Date   WBC 14.7 (H) 05/07/2019   HGB 8.8 (L) 05/07/2019   HCT 28.3 (L) 05/07/2019   MCV 101.4 (H) 05/07/2019   PLT 68 (L) 05/07/2019    _0 @  Radiographic Findings: Mr Cervical Spine W Or Wo Contrast  Result Date: 05/07/2019 CLINICAL DATA:  Urinary retention since 5 a.m. History of metastatic poorly differentiated neoplasm, possibly lung cancer. Currently on chemotherapy. EXAM: MRI CERVICAL, THORACIC AND LUMBAR SPINE WITHOUT AND WITH CONTRAST TECHNIQUE: Multiplanar and multiecho pulse sequences of the cervical spine, to include the craniocervical junction and cervicothoracic junction, and thoracic and lumbar spine, were obtained without and with intravenous contrast. CONTRAST:  5 mL Gadavist intravenous contrast. COMPARISON:  MRI lumbar spine dated March 21, 2019. CT chest, abdomen, and pelvis dated February 19, 2019. CT neck dated October 16, 2018. MRI cervical spine dated February 24, 2018. FINDINGS: MRI CERVICAL SPINE FINDINGS Alignment: New  2 mm anterolisthesis at C2-C3. Vertebrae:  Enhancing expansile lesion in the C2 and C3 posterior elements is similar to CT neck from February, and significantly decreased in size since cervical spine MRI and June 2019. No new suspicious bone lesion. No fracture or evidence of discitis. Cord: Normal signal and morphology.  No intradural enhancement. Posterior Fossa, vertebral arteries, paraspinal tissues: Negative. Disc levels: C2-C3: Negative disc. Mild bilateral facet arthropathy. No stenosis. C3-C4: Unchanged small left paracentral disc protrusion. No stenosis. C4-C5: Unchanged mild disc bulging eccentric to the left and mild bilateral facet uncovertebral hypertrophy. No stenosis. C5-C6: Unchanged mild disc bulging and bilateral facet uncovertebral hypertrophy. Unchanged mild left neuroforaminal stenosis. No spinal canal or right neuroforaminal stenosis. C6-C7:  Unchanged small central disc protrusion.  No stenosis. C7-T1:  Negative. MRI THORACIC SPINE FINDINGS Alignment:  Physiologic. Vertebrae: No fracture, evidence of discitis, or bone lesion. Cord:  Normal signal and morphology.  No intradural enhancement. Paraspinal and other soft tissues: Stable radiation changes in the posterior right upper lobe. Slightly increased small right pleural effusion. New trace left pleural effusion. Disc levels: Small central disc protrusions and minimal disc bulging from T7-T8 through T11-T12. No spinal canal or neuroforaminal stenosis. MRI LUMBAR SPINE FINDINGS Segmentation:  Standard. Alignment:  Unchanged mild retrolisthesis of L2. Vertebrae: Again seen is tumor involving L1, L2, and L3 vertebral bodies. Tumor involvement of the L3 vertebral body appears increased. Unchanged severe pathologic L2 compression fracture. Unchanged tumor involving the L2 posterior elements. Anterior extraosseous extension of tumor is grossly unchanged. Conus medullaris and cauda equina: Conus extends to the L1 level. Conus and cauda equina appear normal. No intradural enhancement.  Paraspinal and other soft tissues: Right sacral ala and left posterior iliac metastases are stable to slightly decreased in size. Disc levels: T12-L1: Unchanged small left paracentral disc protrusion. No stenosis. L1-L2 and L2-L3: Circumferential epidural tumor, worse on the left, has progressed since the prior study, with worsening moderate to severe spinal canal stenosis and severe left lateral recess stenosis at both levels. Unchanged complete tumor infiltration of the left L1-L2 and L2-L3 neural foramina and moderate infiltration of the right L2-L3 neural foramen. L3-L4:  Negative. L4-L5:  Negative. L5-S1:  Negative. IMPRESSION: Cervical spine: 1. Grossly unchanged osseous metastatic disease involving the C2 and C3 posterior elements when compared to neck CT from February 2020. Lesions have significantly decreased in size since MRI cervical spine from June 2019. 2. No progressive metastatic disease. 3. Unchanged mild multilevel cervical spondylosis as described above. No high-grade stenosis. Thoracic spine: 1. No evidence of osseous metastatic disease. 2. Stable radiation changes in the posterior right upper lobe. 3. Slightly increased small right pleural effusion. New trace left pleural effusion. Lumbar spine: 1. Progressive bulky spinal and paraspinal tumor from L1 through L3, with greater involvement of the L3 vertebral body and increased left-sided epidural tumor resulting in progressive moderate to severe spinal canal stenosis and severe left lateral recess stenosis at L1-L2 and L2-L3. 2. Grossly unchanged involvement of the left L1-L2 and bilateral L2-L3 neural foramina. 3. Stable pathologic compression fracture of L2 with mild retropulsion. 4. Right sacral ala and left posterior iliac metastases are stable to slightly decreased in size. Electronically Signed   By: Titus Dubin M.D.   On: 05/07/2019 16:58   Mr Thoracic Spine W Wo Contrast  Result Date: 05/07/2019 CLINICAL DATA:  Urinary retention  since 5 a.m. History of metastatic poorly differentiated neoplasm, possibly lung cancer. Currently on chemotherapy. EXAM: MRI CERVICAL, THORACIC AND LUMBAR SPINE  WITHOUT AND WITH CONTRAST TECHNIQUE: Multiplanar and multiecho pulse sequences of the cervical spine, to include the craniocervical junction and cervicothoracic junction, and thoracic and lumbar spine, were obtained without and with intravenous contrast. CONTRAST:  5 mL Gadavist intravenous contrast. COMPARISON:  MRI lumbar spine dated March 21, 2019. CT chest, abdomen, and pelvis dated February 19, 2019. CT neck dated October 16, 2018. MRI cervical spine dated February 24, 2018. FINDINGS: MRI CERVICAL SPINE FINDINGS Alignment: New 2 mm anterolisthesis at C2-C3. Vertebrae: Enhancing expansile lesion in the C2 and C3 posterior elements is similar to CT neck from February, and significantly decreased in size since cervical spine MRI and June 2019. No new suspicious bone lesion. No fracture or evidence of discitis. Cord: Normal signal and morphology.  No intradural enhancement. Posterior Fossa, vertebral arteries, paraspinal tissues: Negative. Disc levels: C2-C3: Negative disc. Mild bilateral facet arthropathy. No stenosis. C3-C4: Unchanged small left paracentral disc protrusion. No stenosis. C4-C5: Unchanged mild disc bulging eccentric to the left and mild bilateral facet uncovertebral hypertrophy. No stenosis. C5-C6: Unchanged mild disc bulging and bilateral facet uncovertebral hypertrophy. Unchanged mild left neuroforaminal stenosis. No spinal canal or right neuroforaminal stenosis. C6-C7:  Unchanged small central disc protrusion.  No stenosis. C7-T1:  Negative. MRI THORACIC SPINE FINDINGS Alignment:  Physiologic. Vertebrae: No fracture, evidence of discitis, or bone lesion. Cord:  Normal signal and morphology.  No intradural enhancement. Paraspinal and other soft tissues: Stable radiation changes in the posterior right upper lobe. Slightly increased small right  pleural effusion. New trace left pleural effusion. Disc levels: Small central disc protrusions and minimal disc bulging from T7-T8 through T11-T12. No spinal canal or neuroforaminal stenosis. MRI LUMBAR SPINE FINDINGS Segmentation:  Standard. Alignment:  Unchanged mild retrolisthesis of L2. Vertebrae: Again seen is tumor involving L1, L2, and L3 vertebral bodies. Tumor involvement of the L3 vertebral body appears increased. Unchanged severe pathologic L2 compression fracture. Unchanged tumor involving the L2 posterior elements. Anterior extraosseous extension of tumor is grossly unchanged. Conus medullaris and cauda equina: Conus extends to the L1 level. Conus and cauda equina appear normal. No intradural enhancement. Paraspinal and other soft tissues: Right sacral ala and left posterior iliac metastases are stable to slightly decreased in size. Disc levels: T12-L1: Unchanged small left paracentral disc protrusion. No stenosis. L1-L2 and L2-L3: Circumferential epidural tumor, worse on the left, has progressed since the prior study, with worsening moderate to severe spinal canal stenosis and severe left lateral recess stenosis at both levels. Unchanged complete tumor infiltration of the left L1-L2 and L2-L3 neural foramina and moderate infiltration of the right L2-L3 neural foramen. L3-L4:  Negative. L4-L5:  Negative. L5-S1:  Negative. IMPRESSION: Cervical spine: 1. Grossly unchanged osseous metastatic disease involving the C2 and C3 posterior elements when compared to neck CT from February 2020. Lesions have significantly decreased in size since MRI cervical spine from June 2019. 2. No progressive metastatic disease. 3. Unchanged mild multilevel cervical spondylosis as described above. No high-grade stenosis. Thoracic spine: 1. No evidence of osseous metastatic disease. 2. Stable radiation changes in the posterior right upper lobe. 3. Slightly increased small right pleural effusion. New trace left pleural effusion.  Lumbar spine: 1. Progressive bulky spinal and paraspinal tumor from L1 through L3, with greater involvement of the L3 vertebral body and increased left-sided epidural tumor resulting in progressive moderate to severe spinal canal stenosis and severe left lateral recess stenosis at L1-L2 and L2-L3. 2. Grossly unchanged involvement of the left L1-L2 and bilateral L2-L3 neural foramina. 3.  Stable pathologic compression fracture of L2 with mild retropulsion. 4. Right sacral ala and left posterior iliac metastases are stable to slightly decreased in size. Electronically Signed   By: Titus Dubin M.D.   On: 05/07/2019 16:58   Mr Lumbar Spine W Wo Contrast  Result Date: 05/07/2019 CLINICAL DATA:  Urinary retention since 5 a.m. History of metastatic poorly differentiated neoplasm, possibly lung cancer. Currently on chemotherapy. EXAM: MRI CERVICAL, THORACIC AND LUMBAR SPINE WITHOUT AND WITH CONTRAST TECHNIQUE: Multiplanar and multiecho pulse sequences of the cervical spine, to include the craniocervical junction and cervicothoracic junction, and thoracic and lumbar spine, were obtained without and with intravenous contrast. CONTRAST:  5 mL Gadavist intravenous contrast. COMPARISON:  MRI lumbar spine dated March 21, 2019. CT chest, abdomen, and pelvis dated February 19, 2019. CT neck dated October 16, 2018. MRI cervical spine dated February 24, 2018. FINDINGS: MRI CERVICAL SPINE FINDINGS Alignment: New 2 mm anterolisthesis at C2-C3. Vertebrae: Enhancing expansile lesion in the C2 and C3 posterior elements is similar to CT neck from February, and significantly decreased in size since cervical spine MRI and June 2019. No new suspicious bone lesion. No fracture or evidence of discitis. Cord: Normal signal and morphology.  No intradural enhancement. Posterior Fossa, vertebral arteries, paraspinal tissues: Negative. Disc levels: C2-C3: Negative disc. Mild bilateral facet arthropathy. No stenosis. C3-C4: Unchanged small left  paracentral disc protrusion. No stenosis. C4-C5: Unchanged mild disc bulging eccentric to the left and mild bilateral facet uncovertebral hypertrophy. No stenosis. C5-C6: Unchanged mild disc bulging and bilateral facet uncovertebral hypertrophy. Unchanged mild left neuroforaminal stenosis. No spinal canal or right neuroforaminal stenosis. C6-C7:  Unchanged small central disc protrusion.  No stenosis. C7-T1:  Negative. MRI THORACIC SPINE FINDINGS Alignment:  Physiologic. Vertebrae: No fracture, evidence of discitis, or bone lesion. Cord:  Normal signal and morphology.  No intradural enhancement. Paraspinal and other soft tissues: Stable radiation changes in the posterior right upper lobe. Slightly increased small right pleural effusion. New trace left pleural effusion. Disc levels: Small central disc protrusions and minimal disc bulging from T7-T8 through T11-T12. No spinal canal or neuroforaminal stenosis. MRI LUMBAR SPINE FINDINGS Segmentation:  Standard. Alignment:  Unchanged mild retrolisthesis of L2. Vertebrae: Again seen is tumor involving L1, L2, and L3 vertebral bodies. Tumor involvement of the L3 vertebral body appears increased. Unchanged severe pathologic L2 compression fracture. Unchanged tumor involving the L2 posterior elements. Anterior extraosseous extension of tumor is grossly unchanged. Conus medullaris and cauda equina: Conus extends to the L1 level. Conus and cauda equina appear normal. No intradural enhancement. Paraspinal and other soft tissues: Right sacral ala and left posterior iliac metastases are stable to slightly decreased in size. Disc levels: T12-L1: Unchanged small left paracentral disc protrusion. No stenosis. L1-L2 and L2-L3: Circumferential epidural tumor, worse on the left, has progressed since the prior study, with worsening moderate to severe spinal canal stenosis and severe left lateral recess stenosis at both levels. Unchanged complete tumor infiltration of the left L1-L2 and  L2-L3 neural foramina and moderate infiltration of the right L2-L3 neural foramen. L3-L4:  Negative. L4-L5:  Negative. L5-S1:  Negative. IMPRESSION: Cervical spine: 1. Grossly unchanged osseous metastatic disease involving the C2 and C3 posterior elements when compared to neck CT from February 2020. Lesions have significantly decreased in size since MRI cervical spine from June 2019. 2. No progressive metastatic disease. 3. Unchanged mild multilevel cervical spondylosis as described above. No high-grade stenosis. Thoracic spine: 1. No evidence of osseous metastatic disease. 2. Stable radiation  changes in the posterior right upper lobe. 3. Slightly increased small right pleural effusion. New trace left pleural effusion. Lumbar spine: 1. Progressive bulky spinal and paraspinal tumor from L1 through L3, with greater involvement of the L3 vertebral body and increased left-sided epidural tumor resulting in progressive moderate to severe spinal canal stenosis and severe left lateral recess stenosis at L1-L2 and L2-L3. 2. Grossly unchanged involvement of the left L1-L2 and bilateral L2-L3 neural foramina. 3. Stable pathologic compression fracture of L2 with mild retropulsion. 4. Right sacral ala and left posterior iliac metastases are stable to slightly decreased in size. Electronically Signed   By: Titus Dubin M.D.   On: 05/07/2019 16:58    Impression:  I called and spoke with the patient by phone this morning and shared that in light of this information, and given her recent radiation refractory progressive metastatic disease in the spine, we agree with proceeding with surgical decompression and spinal stabilization for management of her disease.  Rebecca Lucas will need sturdy surgical closure given her recent high-dose radiotherapy in the surgical field.  There does not appear to be a further role for radiation at present.   Plan:  At this point, the patient is set up to proceed with surgical decompression/tumor  debulking and spinal stabilization with Dr. Kathyrn Sheriff which we are in agreement with.  Rebecca Lucas will also continue under the care and direction of Dr. Earlie Server for continued management of her systemic disease.  The patient appears to have a good understanding of her disease and treatment recommendations and is comfortable and in agreement with the stated plan.  We will continue to follow peripherally and would be more than happy to continue to participate in her care should there be any indication for further radiotherapy in the future.    Nicholos Johns, PA-C    Tyler Pita, MD  [facility] Direct Dial: [phone]   Fax: [phone] [email or web address]   Skype   LinkedIn

## 2019-05-08 NOTE — Progress Notes (Signed)
PROGRESS NOTE    Rebecca Lucas  ENI:[id number] DOB: 04-27-1956 DOA: 05/07/2019 PCP: Caren Macadam, MD    Brief Narrative:  63 year old female who presented with urinary retention.  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  Patient status post palliative radiotherapy to the lumbar area and currently on chemotherapy.  Patient reported 4 to 5 days of progressive difficulty urinating, 24 hours prior to hospitalization she developed suprapubic pain and urgency.  Her symptoms were associated) to weakness and numbness, difficulty ambulation.  On her initial physical examination blood pressure 103/57, heart rate 120, respiratory rate 17, temperature 99, oxygen saturation 100%.  Moist mucous membranes, lungs clear to auscultation bilaterally, heart S1-S2 present rhythm, abdomen soft, no lower extremity edema.  Left lower extremity strength 0 out of 5, proximal, distal 2-5.  MRI of the lumbar spine with progressive bulky spinal paraspinal tumor, L1-L3.  Progressive moderate to severe spinal canal stenosis and severe left lateral recess stenosis at L1-L2 and L2-L3.  Patient was admitted to the hospital with a working diagnosis of focal neurologic deficit due malignancy related spinal cord compression.   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  Malignancy related cord compression. L1-L3. Patient continue to have left lower extremity weakness and dull back pain. Continue pain control and systemic steroids. Plan for surgical decompression in am per neurosurgery.   2. Urinary retention. Continue to monitor urinary output. Since admission 600 ml.   3. 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 Patient currently under chemotherapy.   DVT prophylaxis: scd   Code Status: full Family Communication: no family at the bedside  Disposition Plan/ discharge barriers: pending surgical intervention.   Body mass index is 20.34 kg/m. Malnutrition Type:      Malnutrition Characteristics:      Nutrition Interventions:     RN Pressure Injury Documentation:     Consultants:   Neurosurgery   Oncology   Procedures:     Antimicrobials:       Subjective: Patient continue to have left lower extremity weakness and back pain. No nausea or vomiting, no chest pain or dyspnea.   Objective: Vitals:   05/08/19 0403 05/08/19 0425 05/08/19 0700 05/08/19 1100  BP: 101/61  (!) 99/57 111/65  Pulse: 88  88 90  Resp: 14  14 14   Temp: 98.6 F (37 C)  98.6 F (37 C) 99.1 F (37.3 C)  TempSrc: Oral  Oral Oral  SpO2: 98%  98% 98%  Weight:  58.9 kg    Height:        Intake/Output Summary (Last 24 hours) at 05/08/2019 1226 Last data filed at 05/08/2019 0900 Gross per 24 hour  Intake -  Output 1000 ml  Net -1000 ml   Filed Weights   05/07/19 2321 05/08/19 0425  Weight: 58.3 kg 58.9 kg    Examination:   General: Not in pain or dyspnea, deconditioned and ill looking appearing  Neurology: Awake and alert, non focal  E ENT: mild pallor, no icterus, oral mucosa moist Cardiovascular: No JVD. S1-S2 present, rhythmic, no gallops, rubs, or murmurs. Left  lower extremity depending pitting edema ++. Pulmonary: vesicular breath sounds  bilaterally, adequate air movement, no wheezing, rhonchi or rales. Gastrointestinal. Abdomen with no organomegaly, non tender, no rebound or guarding Skin. No rashes Musculoskeletal: no joint deformities     Data Reviewed: I have personally reviewed following labs and imaging studies  CBC: Recent Labs  Lab 05/02/19 1028 05/07/19 1005 05/07/19 2219  WBC 12.8* 14.7*  --   NEUTROABS 11.6  13.1*  --   HGB 9.5* 8.8*  --   HCT 30.4* 28.3*  --   MCV 96.8 101.4*  --   PLT 65* PLATELET CLUMPS NOTED ON SMEAR, UNABLE TO ESTIMATE 68*   Basic Metabolic Panel: Recent Labs  Lab 05/02/19 1028 05/07/19 1005  NA 136 136  K 4.9 4.0  CL 103 104  CO2 24 24  GLUCOSE 136* 124*  BUN 24* 21  CREATININE 0.70 0.73  CALCIUM 8.4* 8.8*   GFR: Estimated Creatinine Clearance: 68.7 mL/min (by C-G formula based on SCr of 0.73 mg/dL). Liver Function Tests: Recent Labs  Lab 05/02/19 1028 05/07/19 1005  AST 69* 47*  ALT 171* 79*  ALKPHOS 172* 200*  BILITOT 0.2* <0.1*  PROT 5.7* 5.6*  ALBUMIN 2.7* 2.5*   No results for input(s): LIPASE, AMYLASE in the last 168 hours. No results for input(s): AMMONIA in the last 168 hours. Coagulation Profile: No results for input(s): INR, PROTIME in the last 168 hours. Cardiac Enzymes: No results for input(s): CKTOTAL, CKMB, CKMBINDEX, TROPONINI in the last 168 hours. BNP (last 3 results) No results for input(s): PROBNP in the last 8760 hours. HbA1C: No results for input(s): HGBA1C in the last 72 hours. CBG: Recent Labs  Lab 05/08/19 1139  GLUCAP 109*   Lipid Profile: No results for input(s): CHOL, HDL, LDLCALC, TRIG, CHOLHDL, LDLDIRECT in the last 72 hours. Thyroid Function Tests: No results for input(s): TSH, T4TOTAL, FREET4, T3FREE, THYROIDAB in the last 72 hours. Anemia Panel: No results for input(s): VITAMINB12, FOLATE, FERRITIN, TIBC, IRON, RETICCTPCT in the last 72 hours.    Radiology Studies: I have reviewed all of the imaging during this hospital visit personally     Scheduled Meds: . dexamethasone (DECADRON) injection  6 mg Intravenous Q6H  . [START ON 05/10/2019] fentaNYL  1 patch Transdermal Q72H  . gabapentin  300 mg Oral TID  . linaclotide  72 mcg Oral QAC breakfast  . pantoprazole  40 mg Oral Daily  . sodium chloride flush  3 mL Intravenous Q12H   Continuous Infusions:   LOS: 1 day        Mauricio Gerome Apley, MD

## 2019-05-08 NOTE — Progress Notes (Signed)
HEMATOLOGY-ONCOLOGY PROGRESS NOTE  SUBJECTIVE: Patient was admitted secondary to urinary retention.  She has been having difficulty with urinary retention for about 4 to 5 days.  Yesterday, she was unable to void urine at all and also developed urgency and suprapubic pressure and presented to the emergency room for evaluation.  He also has some lower extremity weakness and numbness primarily on the left side.  She has been using a walker recently but has needed a wheelchair as well.  She recently stopped dexamethasone.  An MRI of the cervical, thoracic, lumbar spine performed on admission showed progressive bulky spinal and paraspinal tumor from L1-L3 with greater involvement of the L3 vertebral body and increased left-sided epidural tumor resulting in progressive moderate to severe spinal canal stenosis and severe left lateral recess stenosis at L1-2 and L2-3.  Neurosurgery has seen the patient who plans to bring her to the OR tomorrow for L2 laminectomy for decompression with stabilization from T11-L5.  When seen today, the patient reports some mild discomfort in her lower back.  This is well controlled with her current dose of fentanyl patch and now with the addition of steroids.  Reports some numbness and swelling primarily in the left lower extremity.  She has no other complaints today.  Oncology History  Metastatic malignant neoplasm ([place])  03/06/2018 Initial Diagnosis   Metastatic malignant neoplasm ([place])   04/11/2018 - 04/03/2019 Chemotherapy   The patient had palonosetron (ALOXI) injection 0.25 mg, 0.25 mg, Intravenous,  Once, 4 of 4 cycles Administration: 0.25 mg (04/11/2018), 0.25 mg (05/02/2018), 0.25 mg (05/22/2018), 0.25 mg (06/13/2018) PEMEtrexed (ALIMTA) 800 mg in sodium chloride 0.9 % 100 mL chemo infusion, 825 mg, Intravenous,  Once, 17 of 21 cycles Administration: 800 mg (04/11/2018), 800 mg (07/04/2018), 800 mg (05/02/2018), 800 mg (05/22/2018), 800 mg (07/24/2018), 800 mg (06/13/2018), 800 mg  (08/16/2018), 800 mg (09/06/2018), 800 mg (09/27/2018), 800 mg (10/18/2018), 800 mg (11/08/2018), 800 mg (11/29/2018), 800 mg (12/20/2018), 800 mg (01/10/2019), 800 mg (01/31/2019), 800 mg (02/21/2019), 800 mg (03/14/2019) CARBOplatin (PARAPLATIN) 450 mg in sodium chloride 0.9 % 250 mL chemo infusion, 450 mg (100 % of original dose 450 mg), Intravenous,  Once, 4 of 4 cycles Dose modification: 450 mg (original dose 450 mg, Cycle 1), 462 mg (original dose 462 mg, Cycle 2), 462 mg (original dose 462 mg, Cycle 3), 462 mg (original dose 462 mg, Cycle 4) Administration: 450 mg (04/11/2018), 460 mg (05/02/2018), 460 mg (05/22/2018), 460 mg (06/13/2018) ondansetron (ZOFRAN) 8 mg, dexamethasone (DECADRON) 10 mg in sodium chloride 0.9 % 50 mL IVPB, , Intravenous,  Once, 1 of 1 cycle pembrolizumab (KEYTRUDA) 200 mg in sodium chloride 0.9 % 50 mL chemo infusion, 200 mg, Intravenous, Once, 17 of 21 cycles Administration: 200 mg (04/11/2018), 200 mg (07/04/2018), 200 mg (05/02/2018), 200 mg (05/22/2018), 200 mg (07/24/2018), 200 mg (06/13/2018), 200 mg (08/16/2018), 200 mg (09/06/2018), 200 mg (09/27/2018), 200 mg (10/18/2018), 200 mg (11/08/2018), 200 mg (11/29/2018), 200 mg (12/20/2018), 200 mg (01/10/2019), 200 mg (01/31/2019), 200 mg (02/21/2019), 200 mg (03/14/2019) fosaprepitant (EMEND) 150 mg, dexamethasone (DECADRON) 12 mg in sodium chloride 0.9 % 145 mL IVPB, , Intravenous,  Once, 4 of 4 cycles Administration:  (04/11/2018),  (05/02/2018),  (05/22/2018),  (06/13/2018)  for chemotherapy treatment.    04/25/2019 -  Chemotherapy   The patient had pegfilgrastim (NEULASTA ONPRO KIT) injection 6 mg, 6 mg, Subcutaneous, Once, 1 of 6 cycles DOCEtaxel (TAXOTERE) 120 mg in sodium chloride 0.9 % 250 mL chemo infusion, 75 mg/m2 =  120 mg, Intravenous,  Once, 1 of 6 cycles Administration: 120 mg (04/25/2019) ramucirumab (CYRAMZA) 500 mg in sodium chloride 0.9 % 200 mL chemo infusion, 10 mg/kg = 500 mg, Intravenous, Once, 1 of 6 cycles Administration: 500 mg  (04/25/2019)  for chemotherapy treatment.    Primary cancer of right upper lobe of lung ([place])  03/06/2018 Initial Diagnosis   Primary cancer of right upper lobe of lung ([place])   04/25/2019 -  Chemotherapy   The patient had pegfilgrastim (NEULASTA ONPRO KIT) injection 6 mg, 6 mg, Subcutaneous, Once, 1 of 6 cycles DOCEtaxel (TAXOTERE) 120 mg in sodium chloride 0.9 % 250 mL chemo infusion, 75 mg/m2 = 120 mg, Intravenous,  Once, 1 of 6 cycles Administration: 120 mg (04/25/2019) ramucirumab (CYRAMZA) 500 mg in sodium chloride 0.9 % 200 mL chemo infusion, 10 mg/kg = 500 mg, Intravenous, Once, 1 of 6 cycles Administration: 500 mg (04/25/2019)  for chemotherapy treatment.       REVIEW OF SYSTEMS:   As noted in the HPI.  I have reviewed the past medical history, past surgical history, social history and family history with the patient and they are unchanged from previous note.   PHYSICAL EXAMINATION: ECOG PERFORMANCE STATUS: 2 - Symptomatic, <50% confined to bed  Vitals:   05/08/19 0700 05/08/19 1100  BP: (!) 99/57 111/65  Pulse: 88 90  Resp: 14 14  Temp: 98.6 F (37 C) 99.1 F (37.3 C)  SpO2: 98% 98%   Filed Weights   05/07/19 2321 05/08/19 0425  Weight: 128 lb 8.5 oz (58.3 kg) 129 lb 13.6 oz (58.9 kg)    Intake/Output from previous day: 08/25 0701 - 08/26 0700 In: -  Out: 600 [Urine:600]  GENERAL:alert, no distress and comfortable SKIN: skin color, texture, turgor are normal, no rashes or significant lesions EYES: normal, Conjunctiva are pink and non-injected, sclera clear OROPHARYNX:no exudate, no erythema and lips, buccal mucosa, and tongue normal  NECK: supple, thyroid normal size, non-tender, without nodularity LYMPH:  no palpable lymphadenopathy in the cervical, axillary or inguinal LUNGS: clear to auscultation and percussion with normal breathing effort HEART: regular rate & rhythm and no murmurs.  Lower extremity edema, left greater than right. ABDOMEN:abdomen soft,  non-tender and normal bowel sounds Musculoskeletal:no cyanosis of digits and no clubbing  NEURO: alert & oriented x 3.  Diminished sensation in her left foot and leg.  LABORATORY DATA:  I have reviewed the data as listed CMP Latest Ref Rng & Units 05/07/2019 05/02/2019 04/25/2019  Glucose 70 - 99 mg/dL 124(H) 136(H) 69(L)  BUN 8 - 23 mg/dL 21 24(H) 20  Creatinine 0.44 - 1.00 mg/dL 0.73 0.70 0.65  Sodium 135 - 145 mmol/L 136 136 137  Potassium 3.5 - 5.1 mmol/L 4.0 4.9 4.3  Chloride 98 - 111 mmol/L 104 103 103  CO2 22 - 32 mmol/L '24 24 24  ' Calcium 8.9 - 10.3 mg/dL 8.8(L) 8.4(L) 9.1  Total Protein 6.5 - 8.1 g/dL 5.6(L) 5.7(L) 6.7  Total Bilirubin 0.3 - 1.2 mg/dL <0.1(L) 0.2(L) <0.2(L)  Alkaline Phos 38 - 126 U/L 200(H) 172(H) 179(H)  AST 15 - 41 U/L 47(H) 69(H) 58(H)  ALT 0 - 44 U/L 79(H) 171(H) 148(H)    Lab Results  Component Value Date   WBC 14.7 (H) 05/07/2019   HGB 8.8 (L) 05/07/2019   HCT 28.3 (L) 05/07/2019   MCV 101.4 (H) 05/07/2019   PLT 68 (L) 05/07/2019   NEUTROABS 13.1 (H) 05/07/2019    Ct Lumbar Spine Wo  Contrast  Result Date: 05/08/2019 CLINICAL DATA:  Metastatic cancer lumbar spine. Preop evaluation. Lung cancer. EXAM: CT LUMBAR SPINE WITHOUT CONTRAST TECHNIQUE: Multidetector CT imaging of the lumbar spine was performed without intravenous contrast administration. Multiplanar CT image reconstructions were also generated. COMPARISON:  Lumbar MRI 02/04/2019 FINDINGS: Segmentation: Normal Alignment: Mild anterolisthesis L1-2 related to fracture. Otherwise normal alignment. Vertebrae: Metastatic disease L1, L2, and L3 vertebral bodies as noted on MRI. Severe pathologic fracture of L2. Tumor is prominently sclerotic in appearance by CT. There is destructive change in the anterior superior L3 vertebral body. Pathologic fracture of the pedicle of L2 bilaterally. Nonpathologic appearing fracture of the right L2 transverse process. Stable metastatic disease in the right sacral ala.  Paraspinal and other soft tissues: Extensive paraspinous soft tissue thickening around the pathologic fracture of L1 compatible with hemorrhage and tumor extending into the soft tissues and retroperitoneum. No enlarged lymph nodes in the retroperitoneum. Disc levels: T12-L1: Negative L1-2: Epidural tumor in the spinal canal left greater than right is better seen by MRI than CT. There is tumor in the left foramen with left foraminal encroachment as well as moderate spinal stenosis as noted on MRI. L2-3: Epidural tumor extending to the left is best seen on MRI and is extending the foramen causing left foraminal encroachment. Moderate spinal stenosis. L3-4: Mild disc bulging without stenosis L4-5: Negative L5-S1: Negative IMPRESSION: Metastatic disease L1, L2, and L3 vertebral bodies with pathologic fracture of L2. Epidural tumor in the canal central and left-sided at L1-2 and L2-3 causing spinal stenosis best visualized on MRI. Pathologic fracture L2 pedicle bilaterally. Nonpathologic fracture right L2 transverse process Metastatic disease right sacral ala stable. Electronically Signed   By: Franchot Gallo M.D.   On: 05/08/2019 13:18   Mr Cervical Spine W Or Wo Contrast  Result Date: 05/07/2019 CLINICAL DATA:  Urinary retention since 5 a.m. History of metastatic poorly differentiated neoplasm, possibly lung cancer. Currently on chemotherapy. EXAM: MRI CERVICAL, THORACIC AND LUMBAR SPINE WITHOUT AND WITH CONTRAST TECHNIQUE: Multiplanar and multiecho pulse sequences of the cervical spine, to include the craniocervical junction and cervicothoracic junction, and thoracic and lumbar spine, were obtained without and with intravenous contrast. CONTRAST:  5 mL Gadavist intravenous contrast. COMPARISON:  MRI lumbar spine dated March 21, 2019. CT chest, abdomen, and pelvis dated February 19, 2019. CT neck dated October 16, 2018. MRI cervical spine dated February 24, 2018. FINDINGS: MRI CERVICAL SPINE FINDINGS Alignment: New 2 mm  anterolisthesis at C2-C3. Vertebrae: Enhancing expansile lesion in the C2 and C3 posterior elements is similar to CT neck from February, and significantly decreased in size since cervical spine MRI and June 2019. No new suspicious bone lesion. No fracture or evidence of discitis. Cord: Normal signal and morphology.  No intradural enhancement. Posterior Fossa, vertebral arteries, paraspinal tissues: Negative. Disc levels: C2-C3: Negative disc. Mild bilateral facet arthropathy. No stenosis. C3-C4: Unchanged small left paracentral disc protrusion. No stenosis. C4-C5: Unchanged mild disc bulging eccentric to the left and mild bilateral facet uncovertebral hypertrophy. No stenosis. C5-C6: Unchanged mild disc bulging and bilateral facet uncovertebral hypertrophy. Unchanged mild left neuroforaminal stenosis. No spinal canal or right neuroforaminal stenosis. C6-C7:  Unchanged small central disc protrusion.  No stenosis. C7-T1:  Negative. MRI THORACIC SPINE FINDINGS Alignment:  Physiologic. Vertebrae: No fracture, evidence of discitis, or bone lesion. Cord:  Normal signal and morphology.  No intradural enhancement. Paraspinal and other soft tissues: Stable radiation changes in the posterior right upper lobe. Slightly increased small right pleural effusion.  New trace left pleural effusion. Disc levels: Small central disc protrusions and minimal disc bulging from T7-T8 through T11-T12. No spinal canal or neuroforaminal stenosis. MRI LUMBAR SPINE FINDINGS Segmentation:  Standard. Alignment:  Unchanged mild retrolisthesis of L2. Vertebrae: Again seen is tumor involving L1, L2, and L3 vertebral bodies. Tumor involvement of the L3 vertebral body appears increased. Unchanged severe pathologic L2 compression fracture. Unchanged tumor involving the L2 posterior elements. Anterior extraosseous extension of tumor is grossly unchanged. Conus medullaris and cauda equina: Conus extends to the L1 level. Conus and cauda equina appear  normal. No intradural enhancement. Paraspinal and other soft tissues: Right sacral ala and left posterior iliac metastases are stable to slightly decreased in size. Disc levels: T12-L1: Unchanged small left paracentral disc protrusion. No stenosis. L1-L2 and L2-L3: Circumferential epidural tumor, worse on the left, has progressed since the prior study, with worsening moderate to severe spinal canal stenosis and severe left lateral recess stenosis at both levels. Unchanged complete tumor infiltration of the left L1-L2 and L2-L3 neural foramina and moderate infiltration of the right L2-L3 neural foramen. L3-L4:  Negative. L4-L5:  Negative. L5-S1:  Negative. IMPRESSION: Cervical spine: 1. Grossly unchanged osseous metastatic disease involving the C2 and C3 posterior elements when compared to neck CT from February 2020. Lesions have significantly decreased in size since MRI cervical spine from June 2019. 2. No progressive metastatic disease. 3. Unchanged mild multilevel cervical spondylosis as described above. No high-grade stenosis. Thoracic spine: 1. No evidence of osseous metastatic disease. 2. Stable radiation changes in the posterior right upper lobe. 3. Slightly increased small right pleural effusion. New trace left pleural effusion. Lumbar spine: 1. Progressive bulky spinal and paraspinal tumor from L1 through L3, with greater involvement of the L3 vertebral body and increased left-sided epidural tumor resulting in progressive moderate to severe spinal canal stenosis and severe left lateral recess stenosis at L1-L2 and L2-L3. 2. Grossly unchanged involvement of the left L1-L2 and bilateral L2-L3 neural foramina. 3. Stable pathologic compression fracture of L2 with mild retropulsion. 4. Right sacral ala and left posterior iliac metastases are stable to slightly decreased in size. Electronically Signed   By: Titus Dubin M.D.   On: 05/07/2019 16:58   Mr Thoracic Spine W Wo Contrast  Result Date:  05/07/2019 CLINICAL DATA:  Urinary retention since 5 a.m. History of metastatic poorly differentiated neoplasm, possibly lung cancer. Currently on chemotherapy. EXAM: MRI CERVICAL, THORACIC AND LUMBAR SPINE WITHOUT AND WITH CONTRAST TECHNIQUE: Multiplanar and multiecho pulse sequences of the cervical spine, to include the craniocervical junction and cervicothoracic junction, and thoracic and lumbar spine, were obtained without and with intravenous contrast. CONTRAST:  5 mL Gadavist intravenous contrast. COMPARISON:  MRI lumbar spine dated March 21, 2019. CT chest, abdomen, and pelvis dated February 19, 2019. CT neck dated October 16, 2018. MRI cervical spine dated February 24, 2018. FINDINGS: MRI CERVICAL SPINE FINDINGS Alignment: New 2 mm anterolisthesis at C2-C3. Vertebrae: Enhancing expansile lesion in the C2 and C3 posterior elements is similar to CT neck from February, and significantly decreased in size since cervical spine MRI and June 2019. No new suspicious bone lesion. No fracture or evidence of discitis. Cord: Normal signal and morphology.  No intradural enhancement. Posterior Fossa, vertebral arteries, paraspinal tissues: Negative. Disc levels: C2-C3: Negative disc. Mild bilateral facet arthropathy. No stenosis. C3-C4: Unchanged small left paracentral disc protrusion. No stenosis. C4-C5: Unchanged mild disc bulging eccentric to the left and mild bilateral facet uncovertebral hypertrophy. No stenosis. C5-C6: Unchanged mild  disc bulging and bilateral facet uncovertebral hypertrophy. Unchanged mild left neuroforaminal stenosis. No spinal canal or right neuroforaminal stenosis. C6-C7:  Unchanged small central disc protrusion.  No stenosis. C7-T1:  Negative. MRI THORACIC SPINE FINDINGS Alignment:  Physiologic. Vertebrae: No fracture, evidence of discitis, or bone lesion. Cord:  Normal signal and morphology.  No intradural enhancement. Paraspinal and other soft tissues: Stable radiation changes in the posterior right  upper lobe. Slightly increased small right pleural effusion. New trace left pleural effusion. Disc levels: Small central disc protrusions and minimal disc bulging from T7-T8 through T11-T12. No spinal canal or neuroforaminal stenosis. MRI LUMBAR SPINE FINDINGS Segmentation:  Standard. Alignment:  Unchanged mild retrolisthesis of L2. Vertebrae: Again seen is tumor involving L1, L2, and L3 vertebral bodies. Tumor involvement of the L3 vertebral body appears increased. Unchanged severe pathologic L2 compression fracture. Unchanged tumor involving the L2 posterior elements. Anterior extraosseous extension of tumor is grossly unchanged. Conus medullaris and cauda equina: Conus extends to the L1 level. Conus and cauda equina appear normal. No intradural enhancement. Paraspinal and other soft tissues: Right sacral ala and left posterior iliac metastases are stable to slightly decreased in size. Disc levels: T12-L1: Unchanged small left paracentral disc protrusion. No stenosis. L1-L2 and L2-L3: Circumferential epidural tumor, worse on the left, has progressed since the prior study, with worsening moderate to severe spinal canal stenosis and severe left lateral recess stenosis at both levels. Unchanged complete tumor infiltration of the left L1-L2 and L2-L3 neural foramina and moderate infiltration of the right L2-L3 neural foramen. L3-L4:  Negative. L4-L5:  Negative. L5-S1:  Negative. IMPRESSION: Cervical spine: 1. Grossly unchanged osseous metastatic disease involving the C2 and C3 posterior elements when compared to neck CT from February 2020. Lesions have significantly decreased in size since MRI cervical spine from June 2019. 2. No progressive metastatic disease. 3. Unchanged mild multilevel cervical spondylosis as described above. No high-grade stenosis. Thoracic spine: 1. No evidence of osseous metastatic disease. 2. Stable radiation changes in the posterior right upper lobe. 3. Slightly increased small right pleural  effusion. New trace left pleural effusion. Lumbar spine: 1. Progressive bulky spinal and paraspinal tumor from L1 through L3, with greater involvement of the L3 vertebral body and increased left-sided epidural tumor resulting in progressive moderate to severe spinal canal stenosis and severe left lateral recess stenosis at L1-L2 and L2-L3. 2. Grossly unchanged involvement of the left L1-L2 and bilateral L2-L3 neural foramina. 3. Stable pathologic compression fracture of L2 with mild retropulsion. 4. Right sacral ala and left posterior iliac metastases are stable to slightly decreased in size. Electronically Signed   By: Titus Dubin M.D.   On: 05/07/2019 16:58   Mr Lumbar Spine W Wo Contrast  Result Date: 05/07/2019 CLINICAL DATA:  Urinary retention since 5 a.m. History of metastatic poorly differentiated neoplasm, possibly lung cancer. Currently on chemotherapy. EXAM: MRI CERVICAL, THORACIC AND LUMBAR SPINE WITHOUT AND WITH CONTRAST TECHNIQUE: Multiplanar and multiecho pulse sequences of the cervical spine, to include the craniocervical junction and cervicothoracic junction, and thoracic and lumbar spine, were obtained without and with intravenous contrast. CONTRAST:  5 mL Gadavist intravenous contrast. COMPARISON:  MRI lumbar spine dated March 21, 2019. CT chest, abdomen, and pelvis dated February 19, 2019. CT neck dated October 16, 2018. MRI cervical spine dated February 24, 2018. FINDINGS: MRI CERVICAL SPINE FINDINGS Alignment: New 2 mm anterolisthesis at C2-C3. Vertebrae: Enhancing expansile lesion in the C2 and C3 posterior elements is similar to CT neck from February, and  significantly decreased in size since cervical spine MRI and June 2019. No new suspicious bone lesion. No fracture or evidence of discitis. Cord: Normal signal and morphology.  No intradural enhancement. Posterior Fossa, vertebral arteries, paraspinal tissues: Negative. Disc levels: C2-C3: Negative disc. Mild bilateral facet arthropathy. No  stenosis. C3-C4: Unchanged small left paracentral disc protrusion. No stenosis. C4-C5: Unchanged mild disc bulging eccentric to the left and mild bilateral facet uncovertebral hypertrophy. No stenosis. C5-C6: Unchanged mild disc bulging and bilateral facet uncovertebral hypertrophy. Unchanged mild left neuroforaminal stenosis. No spinal canal or right neuroforaminal stenosis. C6-C7:  Unchanged small central disc protrusion.  No stenosis. C7-T1:  Negative. MRI THORACIC SPINE FINDINGS Alignment:  Physiologic. Vertebrae: No fracture, evidence of discitis, or bone lesion. Cord:  Normal signal and morphology.  No intradural enhancement. Paraspinal and other soft tissues: Stable radiation changes in the posterior right upper lobe. Slightly increased small right pleural effusion. New trace left pleural effusion. Disc levels: Small central disc protrusions and minimal disc bulging from T7-T8 through T11-T12. No spinal canal or neuroforaminal stenosis. MRI LUMBAR SPINE FINDINGS Segmentation:  Standard. Alignment:  Unchanged mild retrolisthesis of L2. Vertebrae: Again seen is tumor involving L1, L2, and L3 vertebral bodies. Tumor involvement of the L3 vertebral body appears increased. Unchanged severe pathologic L2 compression fracture. Unchanged tumor involving the L2 posterior elements. Anterior extraosseous extension of tumor is grossly unchanged. Conus medullaris and cauda equina: Conus extends to the L1 level. Conus and cauda equina appear normal. No intradural enhancement. Paraspinal and other soft tissues: Right sacral ala and left posterior iliac metastases are stable to slightly decreased in size. Disc levels: T12-L1: Unchanged small left paracentral disc protrusion. No stenosis. L1-L2 and L2-L3: Circumferential epidural tumor, worse on the left, has progressed since the prior study, with worsening moderate to severe spinal canal stenosis and severe left lateral recess stenosis at both levels. Unchanged complete  tumor infiltration of the left L1-L2 and L2-L3 neural foramina and moderate infiltration of the right L2-L3 neural foramen. L3-L4:  Negative. L4-L5:  Negative. L5-S1:  Negative. IMPRESSION: Cervical spine: 1. Grossly unchanged osseous metastatic disease involving the C2 and C3 posterior elements when compared to neck CT from February 2020. Lesions have significantly decreased in size since MRI cervical spine from June 2019. 2. No progressive metastatic disease. 3. Unchanged mild multilevel cervical spondylosis as described above. No high-grade stenosis. Thoracic spine: 1. No evidence of osseous metastatic disease. 2. Stable radiation changes in the posterior right upper lobe. 3. Slightly increased small right pleural effusion. New trace left pleural effusion. Lumbar spine: 1. Progressive bulky spinal and paraspinal tumor from L1 through L3, with greater involvement of the L3 vertebral body and increased left-sided epidural tumor resulting in progressive moderate to severe spinal canal stenosis and severe left lateral recess stenosis at L1-L2 and L2-L3. 2. Grossly unchanged involvement of the left L1-L2 and bilateral L2-L3 neural foramina. 3. Stable pathologic compression fracture of L2 with mild retropulsion. 4. Right sacral ala and left posterior iliac metastases are stable to slightly decreased in size. Electronically Signed   By: Titus Dubin M.D.   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 PDL 1  expression was 99%. The molecular studies by [facility] as well as [employer] showed no actionable mutations. The patient was a started on treatment  with systemic chemotherapy with carboplatin for AUC of 5, Alimta 500 mg/M2 and Keytruda 200 mg IV every 3 weeks. She is status post 4 cycles with partial response.  The patient was started on maintenance treatment with Alimta and Keytruda status post 13 cycles.This was discontinued to to evidence of disease progression with disease progression with epidural extension of her tumor in the lumbar spine. She subsequently received palliative radiotherapy to this area under the care of Dr. Tammi Klippel. Her last radiation treatment was on 04/18/2019.  The patient recently started on docetaxel 75 mg meter squared and Cyramza 10 mg/kg every 3 weeks.  Her first cycle was given on 04/25/2019.  She tolerated her first cycle very well.    Unfortunately, she has now developed urinary retention and MRI shows progressive bulky tumor from L1-L3.  Neurosurgery has seen the patient and plans to bring her to the OR tomorrow for laminectomy and decompression of the tumor.  This plan has been discussed with neurosurgery, medical oncology, radiation oncology who all agree with proceeding with surgery.  She is scheduled for next cycle of chemotherapy next week and will likely need to delay this to allow her additional time to heal following surgery.   LOS: 1 day   Mikey Bussing, DNP, AGPCNP-BC, AOCNP 05/08/19

## 2019-05-09 ENCOUNTER — Inpatient Hospital Stay: Payer: 59

## 2019-05-09 ENCOUNTER — Inpatient Hospital Stay (HOSPITAL_COMMUNITY): Payer: 59

## 2019-05-09 ENCOUNTER — Encounter (HOSPITAL_COMMUNITY): Payer: Self-pay | Admitting: Certified Registered Nurse Anesthetist

## 2019-05-09 ENCOUNTER — Inpatient Hospital Stay (HOSPITAL_COMMUNITY): Payer: 59 | Admitting: Certified Registered Nurse Anesthetist

## 2019-05-09 ENCOUNTER — Encounter (HOSPITAL_COMMUNITY)
Admission: EM | Disposition: A | Discharge status: Rehabilitation Facility | Payer: Self-pay | Source: Home / Self Care | Attending: Internal Medicine

## 2019-05-09 DIAGNOSIS — Z419 Encounter for procedure for purposes other than remedying health state, unspecified: Secondary | ICD-10-CM

## 2019-05-09 DIAGNOSIS — R339 Retention of urine, unspecified: Secondary | ICD-10-CM

## 2019-05-09 HISTORY — PX: POSTERIOR LUMBAR FUSION 4 LEVEL: SHX6037

## 2019-05-09 HISTORY — PX: APPLICATION OF ROBOTIC ASSISTANCE FOR SPINAL PROCEDURE: SHX6753

## 2019-05-09 LAB — POCT I-STAT 7, (LYTES, BLD GAS, ICA,H+H)
Bicarbonate: 25.1 mmol/L (ref 20.0–28.0)
Calcium, Ion: 1.19 mmol/L (ref 1.15–1.40)
HCT: 20 % — ABNORMAL LOW (ref 36.0–46.0)
Hemoglobin: 6.8 g/dL — CL (ref 12.0–15.0)
O2 Saturation: 100 %
Patient temperature: 37
Potassium: 4.3 mmol/L (ref 3.5–5.1)
Sodium: 140 mmol/L (ref 135–145)
TCO2: 26 mmol/L (ref 22–32)
pCO2 arterial: 39.8 mmHg (ref 32.0–48.0)
pH, Arterial: 7.408 (ref 7.350–7.450)
pO2, Arterial: 288 mmHg — ABNORMAL HIGH (ref 83.0–108.0)

## 2019-05-09 LAB — BPAM PLATELET PHERESIS
Blood Product Expiration Date: 202008282359
ISSUE DATE / TIME: 202008261512
Unit Type and Rh: 5100

## 2019-05-09 LAB — POCT I-STAT 4, (NA,K, GLUC, HGB,HCT)
Glucose, Bld: 133 mg/dL — ABNORMAL HIGH (ref 70–99)
HCT: 30 % — ABNORMAL LOW (ref 36.0–46.0)
Hemoglobin: 10.2 g/dL — ABNORMAL LOW (ref 12.0–15.0)
Potassium: 4.8 mmol/L (ref 3.5–5.1)
Sodium: 138 mmol/L (ref 135–145)

## 2019-05-09 LAB — PREPARE PLATELET PHERESIS: Unit division: 0

## 2019-05-09 LAB — PREPARE RBC (CROSSMATCH)

## 2019-05-09 LAB — MRSA PCR SCREENING: MRSA by PCR: NEGATIVE

## 2019-05-09 LAB — GLUCOSE, CAPILLARY: Glucose-Capillary: 124 mg/dL — ABNORMAL HIGH (ref 70–99)

## 2019-05-09 SURGERY — POSTERIOR LUMBAR FUSION 4 LEVEL
Anesthesia: General | Site: Spine Lumbar

## 2019-05-09 MED ORDER — FENTANYL CITRATE (PF) 250 MCG/5ML IJ SOLN
INTRAMUSCULAR | Status: AC
  Filled 2019-05-09: qty 5

## 2019-05-09 MED ORDER — ACETAMINOPHEN 160 MG/5ML PO SOLN: 1000.0000 mg | Freq: Once | ORAL | Status: DC | PRN

## 2019-05-09 MED ORDER — OXYCODONE HCL 5 MG PO TABS
ORAL_TABLET | ORAL | Status: AC
  Filled 2019-05-09: qty 1

## 2019-05-09 MED ORDER — MIDAZOLAM HCL 2 MG/2ML IJ SOLN
INTRAMUSCULAR | Status: DC | PRN
  Administered 2019-05-09: 2 mg via INTRAVENOUS

## 2019-05-09 MED ORDER — BUPIVACAINE HCL (PF) 0.5 % IJ SOLN
INTRAMUSCULAR | Status: DC | PRN
  Administered 2019-05-09: 10 mL

## 2019-05-09 MED ORDER — PROPOFOL 10 MG/ML IV BOLUS
INTRAVENOUS | Status: AC
  Filled 2019-05-09: qty 20

## 2019-05-09 MED ORDER — CEFAZOLIN SODIUM-DEXTROSE 2-4 GM/100ML-% IV SOLN
2.0000 g | INTRAVENOUS | Status: AC
  Administered 2019-05-09: 2 g via INTRAVENOUS
  Filled 2019-05-09: qty 100

## 2019-05-09 MED ORDER — ROCURONIUM BROMIDE 10 MG/ML (PF) SYRINGE
PREFILLED_SYRINGE | INTRAVENOUS | Status: DC | PRN
  Administered 2019-05-09: 60 mg via INTRAVENOUS
  Administered 2019-05-09: 20 mg via INTRAVENOUS
  Administered 2019-05-09 (×2): 10 mg via INTRAVENOUS
  Administered 2019-05-09: 20 mg via INTRAVENOUS

## 2019-05-09 MED ORDER — SODIUM CHLORIDE 0.9% FLUSH
10.0000 mL | Freq: Two times a day (BID) | INTRAVENOUS | Status: DC
  Administered 2019-05-09: 20 mL
  Administered 2019-05-10 – 2019-05-14 (×9): 10 mL

## 2019-05-09 MED ORDER — SODIUM CHLORIDE 0.9% FLUSH: 3.0000 mL | INTRAVENOUS | Status: DC | PRN

## 2019-05-09 MED ORDER — LIDOCAINE 2% (20 MG/ML) 5 ML SYRINGE
INTRAMUSCULAR | Status: AC
  Filled 2019-05-09: qty 15

## 2019-05-09 MED ORDER — 0.9 % SODIUM CHLORIDE (POUR BTL) OPTIME
TOPICAL | Status: DC | PRN
  Administered 2019-05-09: 1000 mL

## 2019-05-09 MED ORDER — SODIUM CHLORIDE 0.9% FLUSH: 10.0000 mL | INTRAVENOUS | Status: DC | PRN

## 2019-05-09 MED ORDER — HYDROMORPHONE HCL 1 MG/ML IJ SOLN
INTRAMUSCULAR | Status: DC | PRN
  Administered 2019-05-09: 0.5 mg via INTRAVENOUS

## 2019-05-09 MED ORDER — SODIUM CHLORIDE 0.9 % IV SOLN
INTRAVENOUS | Status: DC | PRN
  Administered 2019-05-09: 15:00:00 via INTRAVENOUS

## 2019-05-09 MED ORDER — ACETAMINOPHEN 10 MG/ML IV SOLN
1000.0000 mg | Freq: Once | INTRAVENOUS | Status: DC | PRN
  Administered 2019-05-09: 1000 mg via INTRAVENOUS

## 2019-05-09 MED ORDER — SODIUM CHLORIDE 0.9 % IV SOLN
INTRAVENOUS | Status: DC | PRN
  Administered 2019-05-09: 12:00:00 500 mL

## 2019-05-09 MED ORDER — ONDANSETRON HCL 4 MG/2ML IJ SOLN
INTRAMUSCULAR | Status: AC
  Filled 2019-05-09: qty 4

## 2019-05-09 MED ORDER — THROMBIN 5000 UNITS EX SOLR
OROMUCOSAL | Status: DC | PRN
  Administered 2019-05-09 (×2): 5 mL

## 2019-05-09 MED ORDER — FENTANYL CITRATE (PF) 250 MCG/5ML IJ SOLN
INTRAMUSCULAR | Status: DC | PRN
  Administered 2019-05-09 (×3): 50 ug via INTRAVENOUS
  Administered 2019-05-09 (×2): 100 ug via INTRAVENOUS
  Administered 2019-05-09 (×7): 50 ug via INTRAVENOUS
  Administered 2019-05-09: 100 ug via INTRAVENOUS
  Administered 2019-05-09 (×2): 50 ug via INTRAVENOUS

## 2019-05-09 MED ORDER — MIDAZOLAM HCL 2 MG/2ML IJ SOLN
INTRAMUSCULAR | Status: AC
  Filled 2019-05-09: qty 2

## 2019-05-09 MED ORDER — HYDROMORPHONE HCL 1 MG/ML IJ SOLN
INTRAMUSCULAR | Status: AC
  Filled 2019-05-09: qty 0.5

## 2019-05-09 MED ORDER — SODIUM CHLORIDE 0.9% FLUSH
3.0000 mL | Freq: Two times a day (BID) | INTRAVENOUS | Status: DC
  Administered 2019-05-09 – 2019-05-13 (×4): 3 mL via INTRAVENOUS

## 2019-05-09 MED ORDER — PROPOFOL 10 MG/ML IV BOLUS
INTRAVENOUS | Status: DC | PRN
  Administered 2019-05-09: 60 mg via INTRAVENOUS
  Administered 2019-05-09: 30 mg via INTRAVENOUS

## 2019-05-09 MED ORDER — FENTANYL CITRATE (PF) 100 MCG/2ML IJ SOLN
25.0000 ug | INTRAMUSCULAR | Status: DC | PRN
  Administered 2019-05-09 (×2): 50 ug via INTRAVENOUS

## 2019-05-09 MED ORDER — ROCURONIUM BROMIDE 10 MG/ML (PF) SYRINGE
PREFILLED_SYRINGE | INTRAVENOUS | Status: AC
  Filled 2019-05-09: qty 40

## 2019-05-09 MED ORDER — THROMBIN 5000 UNITS EX SOLR
CUTANEOUS | Status: AC
  Filled 2019-05-09: qty 5000

## 2019-05-09 MED ORDER — HEPARIN SOD (PORK) LOCK FLUSH 100 UNIT/ML IV SOLN
500.0000 [IU] | INTRAVENOUS | Status: DC
  Filled 2019-05-09: qty 5

## 2019-05-09 MED ORDER — DEXAMETHASONE SODIUM PHOSPHATE 10 MG/ML IJ SOLN
INTRAMUSCULAR | Status: AC
  Filled 2019-05-09: qty 1

## 2019-05-09 MED ORDER — HEPARIN SOD (PORK) LOCK FLUSH 100 UNIT/ML IV SOLN
500.0000 [IU] | INTRAVENOUS | Status: DC | PRN
  Administered 2019-05-09: 500 [IU]

## 2019-05-09 MED ORDER — OXYCODONE HCL 5 MG PO TABS
5.0000 mg | ORAL_TABLET | Freq: Once | ORAL | Status: AC | PRN
  Administered 2019-05-09: 5 mg via ORAL

## 2019-05-09 MED ORDER — LACTATED RINGERS IV SOLN
INTRAVENOUS | Status: DC
  Administered 2019-05-09 (×2): via INTRAVENOUS

## 2019-05-09 MED ORDER — ACETAMINOPHEN 500 MG PO TABS: 1000.0000 mg | ORAL_TABLET | Freq: Once | ORAL | Status: DC | PRN

## 2019-05-09 MED ORDER — PHENYLEPHRINE 40 MCG/ML (10ML) SYRINGE FOR IV PUSH (FOR BLOOD PRESSURE SUPPORT)
PREFILLED_SYRINGE | INTRAVENOUS | Status: AC
  Filled 2019-05-09: qty 20

## 2019-05-09 MED ORDER — BUPIVACAINE HCL (PF) 0.5 % IJ SOLN
INTRAMUSCULAR | Status: AC
  Filled 2019-05-09: qty 30

## 2019-05-09 MED ORDER — MENTHOL 3 MG MT LOZG: 1.0000 | LOZENGE | OROMUCOSAL | Status: DC | PRN

## 2019-05-09 MED ORDER — FENTANYL 75 MCG/HR TD PT72
1.0000 | MEDICATED_PATCH | TRANSDERMAL | Status: DC
  Administered 2019-05-09 – 2019-05-12 (×2): 1 via TRANSDERMAL
  Filled 2019-05-09 (×2): qty 1

## 2019-05-09 MED ORDER — PHENYLEPHRINE 40 MCG/ML (10ML) SYRINGE FOR IV PUSH (FOR BLOOD PRESSURE SUPPORT)
PREFILLED_SYRINGE | INTRAVENOUS | Status: DC | PRN
  Administered 2019-05-09 (×2): 80 ug via INTRAVENOUS

## 2019-05-09 MED ORDER — ACETAMINOPHEN 10 MG/ML IV SOLN
INTRAVENOUS | Status: AC
  Filled 2019-05-09: qty 100

## 2019-05-09 MED ORDER — OXYCODONE HCL 5 MG/5ML PO SOLN: 5.0000 mg | Freq: Once | ORAL | Status: AC | PRN

## 2019-05-09 MED ORDER — LIDOCAINE-EPINEPHRINE 1 %-1:100000 IJ SOLN
INTRAMUSCULAR | Status: DC | PRN
  Administered 2019-05-09: 10 mL

## 2019-05-09 MED ORDER — SODIUM CHLORIDE 0.9 % IV SOLN: 250.0000 mL | INTRAVENOUS | Status: DC

## 2019-05-09 MED ORDER — CEFAZOLIN SODIUM-DEXTROSE 2-4 GM/100ML-% IV SOLN
2.0000 g | Freq: Three times a day (TID) | INTRAVENOUS | Status: AC
  Administered 2019-05-09 – 2019-05-10 (×2): 2 g via INTRAVENOUS
  Filled 2019-05-09 (×2): qty 100

## 2019-05-09 MED ORDER — LIDOCAINE-EPINEPHRINE 1 %-1:100000 IJ SOLN
INTRAMUSCULAR | Status: AC
  Filled 2019-05-09: qty 1

## 2019-05-09 MED ORDER — FENTANYL CITRATE (PF) 100 MCG/2ML IJ SOLN
INTRAMUSCULAR | Status: AC
  Filled 2019-05-09: qty 2

## 2019-05-09 MED ORDER — LIDOCAINE 2% (20 MG/ML) 5 ML SYRINGE
INTRAMUSCULAR | Status: DC | PRN
  Administered 2019-05-09: 60 mg via INTRAVENOUS

## 2019-05-09 MED ORDER — PHENOL 1.4 % MT LIQD: 1.0000 | OROMUCOSAL | Status: DC | PRN

## 2019-05-09 MED ORDER — SODIUM CHLORIDE 0.9 % IV SOLN
INTRAVENOUS | Status: DC
  Administered 2019-05-09: 18:00:00 via INTRAVENOUS

## 2019-05-09 SURGICAL SUPPLY — 90 items
ADH SKN CLS APL DERMABOND .7 (GAUZE/BANDAGES/DRESSINGS) ×1
APL SKNCLS STERI-STRIP NONHPOA (GAUZE/BANDAGES/DRESSINGS)
BAG DECANTER FOR FLEXI CONT (MISCELLANEOUS) ×2 IMPLANT
BASKET BONE COLLECTION (BASKET) ×2 IMPLANT
BENZOIN TINCTURE PRP APPL 2/3 (GAUZE/BANDAGES/DRESSINGS) IMPLANT
BIT DRILL LONG 3.0X30 (BIT) IMPLANT
BIT DRILL LONG 3X80 (BIT) IMPLANT
BIT DRILL LONG 4X80 (BIT) IMPLANT
BIT DRILL SHORT 3.0X30 (BIT) ×1 IMPLANT
BIT DRILL SHORT 3X80 (BIT) IMPLANT
BLADE CLIPPER SURG (BLADE) IMPLANT
BLADE SURG 11 STRL SS (BLADE) ×4 IMPLANT
BUR MATCHSTICK NEURO 3.0 LAGG (BURR) ×2 IMPLANT
BUR PRECISION FLUTE 5.0 (BURR) ×2 IMPLANT
CANISTER SUCT 3000ML PPV (MISCELLANEOUS) ×2 IMPLANT
CARTRIDGE OIL MAESTRO DRILL (MISCELLANEOUS) ×1 IMPLANT
CATH FOLEY 2WAY SLVR  5CC 14FR (CATHETERS) ×1
CATH FOLEY 2WAY SLVR 5CC 14FR (CATHETERS) ×1 IMPLANT
CONT SPEC 4OZ CLIKSEAL STRL BL (MISCELLANEOUS) ×2 IMPLANT
COVER BACK TABLE 60X90IN (DRAPES) ×2 IMPLANT
DECANTER SPIKE VIAL GLASS SM (MISCELLANEOUS) ×2 IMPLANT
DERMABOND ADVANCED (GAUZE/BANDAGES/DRESSINGS) ×1
DERMABOND ADVANCED .7 DNX12 (GAUZE/BANDAGES/DRESSINGS) ×1 IMPLANT
DIFFUSER DRILL AIR PNEUMATIC (MISCELLANEOUS) ×2 IMPLANT
DRAPE C-ARM 42X72 X-RAY (DRAPES) ×2 IMPLANT
DRAPE C-ARMOR (DRAPES) ×2 IMPLANT
DRAPE LAPAROTOMY 100X72X124 (DRAPES) ×2 IMPLANT
DRAPE SHEET LG 3/4 BI-LAMINATE (DRAPES) ×2 IMPLANT
DRAPE SURG 17X23 STRL (DRAPES) ×2 IMPLANT
DRSG OPSITE POSTOP 4X10 (GAUZE/BANDAGES/DRESSINGS) ×1 IMPLANT
DURAPREP 26ML APPLICATOR (WOUND CARE) ×2 IMPLANT
ELECT REM PT RETURN 9FT ADLT (ELECTROSURGICAL) ×2
ELECTRODE REM PT RTRN 9FT ADLT (ELECTROSURGICAL) ×1 IMPLANT
GAUZE 4X4 16PLY RFD (DISPOSABLE) ×1 IMPLANT
GAUZE SPONGE 4X4 12PLY STRL (GAUZE/BANDAGES/DRESSINGS) ×1 IMPLANT
GAUZE SPONGE 4X4 16PLY XRAY LF (GAUZE/BANDAGES/DRESSINGS) ×1 IMPLANT
GLOVE BIO SURGEON STRL SZ7 (GLOVE) ×1 IMPLANT
GLOVE BIO SURGEON STRL SZ7.5 (GLOVE) ×1 IMPLANT
GLOVE BIOGEL PI IND STRL 7.0 (GLOVE) IMPLANT
GLOVE BIOGEL PI IND STRL 7.5 (GLOVE) ×2 IMPLANT
GLOVE BIOGEL PI INDICATOR 7.0 (GLOVE) ×1
GLOVE BIOGEL PI INDICATOR 7.5 (GLOVE) ×2
GLOVE ECLIPSE 7.0 STRL STRAW (GLOVE) ×4 IMPLANT
GOWN STRL REUS W/ TWL LRG LVL3 (GOWN DISPOSABLE) ×2 IMPLANT
GOWN STRL REUS W/ TWL XL LVL3 (GOWN DISPOSABLE) IMPLANT
GOWN STRL REUS W/TWL 2XL LVL3 (GOWN DISPOSABLE) IMPLANT
GOWN STRL REUS W/TWL LRG LVL3 (GOWN DISPOSABLE) ×8
GOWN STRL REUS W/TWL XL LVL3 (GOWN DISPOSABLE)
GUIDEWIRE TROC TIP NIT 18 (WIRE) ×10 IMPLANT
HEMOSTAT POWDER KIT SURGIFOAM (HEMOSTASIS) ×3 IMPLANT
KIT BASIN OR (CUSTOM PROCEDURE TRAY) ×2 IMPLANT
KIT POSITION SURG JACKSON T1 (MISCELLANEOUS) ×2 IMPLANT
KIT SPINE MAZOR X ROBO DISP (MISCELLANEOUS) ×2 IMPLANT
KIT TURNOVER KIT B (KITS) ×2 IMPLANT
MILL MEDIUM DISP (BLADE) ×1 IMPLANT
NDL HYPO 18GX1.5 BLUNT FILL (NEEDLE) IMPLANT
NDL SPNL 18GX3.5 QUINCKE PK (NEEDLE) IMPLANT
NEEDLE HYPO 18GX1.5 BLUNT FILL (NEEDLE) IMPLANT
NEEDLE HYPO 22GX1.5 SAFETY (NEEDLE) ×2 IMPLANT
NEEDLE SPNL 18GX3.5 QUINCKE PK (NEEDLE) ×2 IMPLANT
NS IRRIG 1000ML POUR BTL (IV SOLUTION) ×2 IMPLANT
OIL CARTRIDGE MAESTRO DRILL (MISCELLANEOUS) ×2
PACK LAMINECTOMY NEURO (CUSTOM PROCEDURE TRAY) ×2 IMPLANT
PAD ARMBOARD 7.5X6 YLW CONV (MISCELLANEOUS) ×6 IMPLANT
PIN HEAD 2.5X60MM (PIN) IMPLANT
PUTTY DBM 10CC ×1 IMPLANT
ROD LORD MIS 300 (Rod) ×2 IMPLANT
SCREW CANN MIS PA 4.5X45 (Screw) ×2 IMPLANT
SCREW CANN MIS PA 5.5X50 (Screw) ×1 IMPLANT
SCREW CANN MIS PA 6.5X45 (Screw) ×2 IMPLANT
SCREW CANN PA INVICTUS 5.5X45 (Screw) ×3 IMPLANT
SCREW SCHANZ SA 4.0MM (MISCELLANEOUS) ×1 IMPLANT
SET SCREW (Screw) ×16 IMPLANT
SET SCREW SPNE (Screw) IMPLANT
SPONGE LAP 4X18 RFD (DISPOSABLE) IMPLANT
SPONGE SURGIFOAM ABS GEL 100 (HEMOSTASIS) IMPLANT
STRIP CLOSURE SKIN 1/2X4 (GAUZE/BANDAGES/DRESSINGS) IMPLANT
SUT PROLENE 0 CT 1 30 (SUTURE) ×2 IMPLANT
SUT VIC AB 0 CT1 18XCR BRD8 (SUTURE) ×2 IMPLANT
SUT VIC AB 0 CT1 8-18 (SUTURE) ×4
SUT VIC AB 1 CT1 18XBRD ANBCTR (SUTURE) IMPLANT
SUT VIC AB 1 CT1 8-18 (SUTURE) ×2
SUT VIC AB 3-0 SH 8-18 (SUTURE) ×3 IMPLANT
SUT VICRYL 3-0 RB1 18 ABS (SUTURE) ×2 IMPLANT
SYR 3ML LL SCALE MARK (SYRINGE) ×6 IMPLANT
TOWEL GREEN STERILE (TOWEL DISPOSABLE) ×2 IMPLANT
TOWEL GREEN STERILE FF (TOWEL DISPOSABLE) ×2 IMPLANT
TRAY FOLEY MTR SLVR 16FR STAT (SET/KITS/TRAYS/PACK) ×4 IMPLANT
TUBE MAZOR SA REDUCTION (TUBING) ×2 IMPLANT
WATER STERILE IRR 1000ML POUR (IV SOLUTION) ×2 IMPLANT

## 2019-05-09 NOTE — Progress Notes (Signed)
PROGRESS NOTE    ELLIETT GUARISCO  PQZ:[id number] DOB: 11-Feb-1956 DOA: 05/07/2019 PCP: Caren Macadam, MD    Brief Narrative:  63 year old female who presented with urinary retention.  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  Patient status post palliative radiotherapy to the lumbar area and currently on chemotherapy.  Patient reported 4 to 5 days of progressive difficulty urinating, 24 hours prior to hospitalization she developed suprapubic pain and urgency.  Her symptoms were associated) to weakness and numbness, difficulty ambulation.  On her initial physical examination blood pressure 103/57, heart rate 120, respiratory rate 17, temperature 99, oxygen saturation 100%.  Moist mucous membranes, lungs clear to auscultation bilaterally, heart S1-S2 present rhythm, abdomen soft, no lower extremity edema.  Left lower extremity strength 0 out of 5, proximal, distal 2-5.  MRI of the lumbar spine with progressive bulky spinal paraspinal tumor, L1-L3.  Progressive moderate to severe spinal canal stenosis and severe left lateral recess stenosis at L1-L2 and L2-L3.  Patient was admitted to the hospital with a working diagnosis of focal neurologic deficit due malignancy related spinal cord compression.   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  Malignancy related cord compression. L1-L3.  Pain control and systemic steroids.For surgical decompression today.   2. Urinary retention. No further urinary retention.  3. 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 On chemotherapy per oncology recommendations.   DVT prophylaxis: scd   Code  Status: full Family Communication: I spoke with patient's husband at the bedside and all questions were addressed.  Disposition Plan/ discharge barriers: pending surgical intervention.    Body mass index is 20.06 kg/m. Malnutrition Type:      Malnutrition Characteristics:      Nutrition Interventions:     RN Pressure Injury Documentation:     Consultants:   Neurosurgery   Procedures:     Antimicrobials:       Subjective: Stable back pain, improved with analgesics, no nausea or vomiting, no chest pain or dyspnea.   Objective: Vitals:   05/08/19 2334 05/09/19 0343 05/09/19 0500 05/09/19 0824  BP: 106/74 110/73  111/65  Pulse: (!) 105 98  88  Resp: 15 14  20   Temp: 98.4 F (36.9 C)   98.4 F (36.9 C)  TempSrc: Oral   Oral  SpO2: 98% 97%  98%  Weight:   58.1 kg   Height:        Intake/Output Summary (Last 24 hours) at 05/09/2019 3354 Last data filed at 05/08/2019 1700 Gross per 24 hour  Intake 416.67 ml  Output 200 ml  Net 216.67 ml   Filed Weights   05/07/19 2321 05/08/19 0425 05/09/19 0500  Weight: 58.3 kg 58.9 kg 58.1 kg    Examination:   General: Not in pain or dyspnea, deconditioned  Neurology: Awake and alert, non focal  E ENT: mild pallor, no icterus, oral mucosa moist Cardiovascular: No JVD. S1-S2 present, rhythmic, no gallops, rubs, or murmurs. Left lower extremity dependent edema. Pulmonary: positive breath sounds bilaterally, adequate air movement, no wheezing, rhonchi or rales. Gastrointestinal. Abdomen with no organomegaly, non tender, no rebound or guarding Skin. No rashes Musculoskeletal: no  joint deformities     Data Reviewed: I have personally reviewed following labs and imaging studies  CBC: Recent Labs  Lab 05/02/19 1028 05/07/19 1005 05/07/19 2219 05/08/19 1752  WBC 12.8* 14.7*  --  24.1*  NEUTROABS 11.6 13.1*  --   --   HGB 9.5* 8.8*  --  8.2*  HCT 30.4* 28.3*  --  25.4*  MCV 96.8 101.4*  --  97.3  PLT 65*  PLATELET CLUMPS NOTED ON SMEAR, UNABLE TO ESTIMATE 68* 568   Basic Metabolic Panel: Recent Labs  Lab 05/02/19 1028 05/07/19 1005  NA 136 136  K 4.9 4.0  CL 103 104  CO2 24 24  GLUCOSE 136* 124*  BUN 24* 21  CREATININE 0.70 0.73  CALCIUM 8.4* 8.8*   GFR: Estimated Creatinine Clearance: 67.7 mL/min (by C-G formula based on SCr of 0.73 mg/dL). Liver Function Tests: Recent Labs  Lab 05/02/19 1028 05/07/19 1005  AST 69* 47*  ALT 171* 79*  ALKPHOS 172* 200*  BILITOT 0.2* <0.1*  PROT 5.7* 5.6*  ALBUMIN 2.7* 2.5*   No results for input(s): LIPASE, AMYLASE in the last 168 hours. No results for input(s): AMMONIA in the last 168 hours. Coagulation Profile: No results for input(s): INR, PROTIME in the last 168 hours. Cardiac Enzymes: No results for input(s): CKTOTAL, CKMB, CKMBINDEX, TROPONINI in the last 168 hours. BNP (last 3 results) No results for input(s): PROBNP in the last 8760 hours. HbA1C: No results for input(s): HGBA1C in the last 72 hours. CBG: Recent Labs  Lab 05/08/19 1139 05/09/19 0821  GLUCAP 109* 124*   Lipid Profile: No results for input(s): CHOL, HDL, LDLCALC, TRIG, CHOLHDL, LDLDIRECT in the last 72 hours. Thyroid Function Tests: No results for input(s): TSH, T4TOTAL, FREET4, T3FREE, THYROIDAB in the last 72 hours. Anemia Panel: No results for input(s): VITAMINB12, FOLATE, FERRITIN, TIBC, IRON, RETICCTPCT in the last 72 hours.    Radiology Studies: I have reviewed all of the imaging during this hospital visit personally     Scheduled Meds: . dexamethasone (DECADRON) injection  6 mg Intravenous Q6H  . [START ON 05/10/2019] fentaNYL  1 patch Transdermal Q72H  . gabapentin  300 mg Oral TID  . linaclotide  72 mcg Oral QAC breakfast  . pantoprazole  40 mg Oral Daily  . sodium chloride flush  3 mL Intravenous Q12H   Continuous Infusions: .  ceFAZolin (ANCEF) IV       LOS: 2 days        Pinky Ravan Gerome Apley, MD

## 2019-05-09 NOTE — Progress Notes (Signed)
  NEUROSURGERY PROGRESS NOTE   No issues overnight. No new complaints this am  EXAM:  BP 111/65 (BP Location: Right Arm)   Pulse 88   Temp 98.4 F (36.9 C) (Oral)   Resp 20   Ht 5\' 7"  (1.702 m)   Wt 58.1 kg   SpO2 98%   BMI 20.06 kg/m   Awake, alert, oriented  Speech fluent, appropriate  CN grossly intact  5/5 BUE/BLE   IMAGING: MRI of the lumbar spine was personally reviewed and compared with most recent MRI of July 2020.  This demonstrates progression of tumor primarily at L2 within the ventral and left lateral epidural space with progressive stenosis now severe.  There is also tumor infiltration at L1 and L3 which appears to be somewhat stable.  There is associated pathologic fracture at L2.  IMPRESSION:  63 y.o. female with history of poorly differentiated adenocarcinoma and L1-L3 tumor who has undergone stereotactic radiosurgery in May with progression followed by salvage fractionated radiation treatment completed a few weeks ago.  Unfortunately she has had continued tumor progression and now presents with 4 to 5 days of urinary retention.  The only remaining option would be surgical decompression.  PLAN: -We will plan on proceeding with operative decompression at L2 with instrumented stabilization from T11-12 to L3-4 -We will plan on platelet transfusion for her thrombocytopenia prior to surgery  I did review the situation with Dr. Earlie Server who agrees that surgical decompression would be a reasonable option at this point in an attempt to preserve function and reduce pain.  I therefore did review the treatment options at this point with the patient and her husband who is a physician at [employer].  We discussed my recommendation for surgical decompression and instrumented stabilization as above.  Risks of the surgery were reviewed in great detail including risk of nerve root injury leading to worsening weakness, infection especially given her prior radiation treatment and current  chemotherapy, CSF leak, bleeding, and general risks of anesthesia including heart attack, stroke, and blood clots.  They appear to understand our discussion.  All their questions were answered.  They are willing to proceed with surgery.

## 2019-05-09 NOTE — Anesthesia Preprocedure Evaluation (Signed)
Anesthesia Evaluation  Patient identified by MRN, date of birth, ID band Patient awake    Reviewed: Allergy & Precautions, NPO status , Patient's Chart, lab work & pertinent test results  History of Anesthesia Complications Negative for: history of anesthetic complications  Airway Mallampati: II  TM Distance: >3 FB Neck ROM: Full    Dental  (+) Dental Advisory Given   Pulmonary  Cancer of upper lobe of right lung (HCC)   breath sounds clear to auscultation       Cardiovascular negative cardio ROS   Rhythm:Regular     Neuro/Psych Epidural tumor  Neuromuscular disease negative psych ROS   GI/Hepatic negative GI ROS, Neg liver ROS,   Endo/Other  negative endocrine ROS  Renal/GU negative Renal ROS     Musculoskeletal negative musculoskeletal ROS (+)   Abdominal   Peds  Hematology negative hematology ROS (+)   Anesthesia Other Findings   Reproductive/Obstetrics                             Anesthesia Physical Anesthesia Plan  ASA: III  Anesthesia Plan: General   Post-op Pain Management:    Induction: Intravenous  PONV Risk Score and Plan: 3 and Ondansetron and Dexamethasone  Airway Management Planned: Oral ETT  Additional Equipment: None  Intra-op Plan:   Post-operative Plan: Extubation in OR  Informed Consent: I have reviewed the patients History and Physical, chart, labs and discussed the procedure including the risks, benefits and alternatives for the proposed anesthesia with the patient or authorized representative who has indicated his/her understanding and acceptance.     Dental advisory given  Plan Discussed with: CRNA and Surgeon  Anesthesia Plan Comments:         Anesthesia Quick Evaluation

## 2019-05-09 NOTE — Progress Notes (Addendum)
  NEUROSURGERY PROGRESS NOTE   No issues overnight, no new complaints this am.  EXAM:  BP 111/65 (BP Location: Right Arm)   Pulse 88   Temp 98.4 F (36.9 C) (Oral)   Resp 20   Ht 5\' 7"  (1.702 m)   Wt 58.1 kg   SpO2 98%   BMI 20.06 kg/m   Awake, alert, oriented  Speech fluent, appropriate  CN grossly intact  1/5 proximal LLE strength, 4/5 distal  IMPRESSION:  63 y.o. female with radiation failure and progression of L1-3 tumor with several days of urinary retention. Thrombocytopenia likely related to new chemo has responded appropriately to plt transfusion.  PLAN: - Will proceed with L2 decompression and instrumented stabilization  Procedure has been extensively reviewed with the patient and her husband. No questions today, consent obtained.

## 2019-05-09 NOTE — Anesthesia Procedure Notes (Signed)
Arterial Line Insertion Start/End8/27/2020 10:30 AM Performed by: Elayne Snare, CRNA, CRNA  Preanesthetic checklist: patient identified, IV checked, risks and benefits discussed, surgical consent and monitors and equipment checked Lidocaine 1% used for infiltration Right, radial was placed Catheter size: 20 G Hand hygiene performed  and maximum sterile barriers used  Allen's test indicative of satisfactory collateral circulation Attempts: 1 Procedure performed without using ultrasound guided technique. Following insertion, Biopatch and dressing applied. Post procedure assessment: normal  Patient tolerated the procedure well with no immediate complications.

## 2019-05-09 NOTE — Anesthesia Procedure Notes (Signed)
Procedure Name: Intubation Date/Time: 05/09/2019 11:33 AM Performed by: Elayne Snare, CRNA Pre-anesthesia Checklist: Patient identified, Emergency Drugs available, Suction available and Patient being monitored Patient Re-evaluated:Patient Re-evaluated prior to induction Oxygen Delivery Method: Circle System Utilized Preoxygenation: Pre-oxygenation with 100% oxygen Induction Type: IV induction Ventilation: Mask ventilation without difficulty Laryngoscope Size: Mac and 3 Grade View: Grade I Tube type: Oral Tube size: 7.0 mm Number of attempts: 1 Airway Equipment and Method: Stylet Placement Confirmation: ETT inserted through vocal cords under direct vision,  positive ETCO2 and breath sounds checked- equal and bilateral Secured at: 22 cm Tube secured with: Tape Dental Injury: Teeth and Oropharynx as per pre-operative assessment

## 2019-05-09 NOTE — Op Note (Signed)
NEUROSURGERY OPERATIVE NOTE   PREOP DIAGNOSIS:  1. Metastatic spine tumor, L1-L3 2. Spinal stenosis with cauda equina syndrome   POSTOP DIAGNOSIS: Same  PROCEDURE: 1. L2 laminectomy, bilateral pediculotomy (biltaeral transpedicular approach) for decompression of thecal sac and nerve roots 2. Posterior segmental instrumentation, T12-L4 using alphatec screws 3. Posterolateral arthrodesis, L1-L3 4. Use of morcellized bone allograft  SURGEON: Dr. Consuella Lose, MD  ASSISTANT: Dr. Jovita Gamma, MD  ANESTHESIA: General Endotracheal  EBL: 600cc  SPECIMENS: Epidural tumor  DRAINS: None  COMPLICATIONS: None immediate  CONDITION: Hemodynamically stable to PACU  HISTORY: Rebecca Lucas is a 63 y.o. female with a history of metastatic poorly differentiated carcinoma with metastases to L1-L3.  She initially underwent treatment with stereotactic radiosurgery back in May of this year.  Unfortunately she had treatment failure with progression of epidural tumor on repeat MRI.  She underwent salvage radiation treatment with 10 fractions to a total of 30 Gray to the same region which completed 3 or 4 weeks ago.  She presented to the emergency department with several days of progressive urinary retention.  MRI was done which demonstrated worsening of epidural tumor specifically at L2 with severe spinal stenosis.  After discussion with the patient and her family as well as her oncologist and radiation oncologist it was felt that surgical decompression was the only remaining option.  The risks and benefits of the surgery were reviewed in detail with both the patient and her family.  After all questions were answered informed consent was obtained and witnessed.  PROCEDURE IN DETAIL: The patient was brought to the operating room. After induction of general anesthesia, the patient was positioned on the operative table in the prone position. All pressure points were meticulously padded. Skin  incision was then marked out and prepped and draped in the usual sterile fashion.  Midline skin incision was infiltrated with local anesthetic with epinephrine.  A Schanz pin was then placed in the posterior superior iliac spine on the left side.  The Mazor robot was then attached to the bed and secured to the patient.  AP and oblique fluoroscopy was then co-registered with the previously obtained stereotactic CT scan, and satisfactory accuracy was achieved.  Midline skin incision was then opened and carried down to the lumbodorsal fascia.  The skin was then undermined.  Using preplanned pedicle trajectories, K-wires were placed percutaneously through the fascia bilaterally at T12, L1, L3, and L4.  These were then secured.  AP and lateral fluoroscopy was then used to confirm good placement of the K wires.  At this point attention was turned to decompression.  The fascia was opened in the midline overlying the L2 spinous process.  Subperiosteal dissection was carried out across the lamina of L2 to identify the pars interarticularis as well as the L1-2 and L2-3 facet complexes and the transverse process at L2.  Self-retaining retractor was then placed.  Stann Mainland were used to remove the spinous process.  High-speed drill was then used to remove the lamina as well as the inferior articulating process at L2.  This allowed identification of normal dura on the patient's right side.  The L2 pedicles were then identified bilaterally and removed using a combination of rongeurs and high-speed drill.  This did require cutting the L2 transverse processes bilaterally.  It also required removal of the inferior articulating process of L1.  Once the bony decompression was completed, attempt was made to dissect the epidural tumor away from the thecal sac especially on the patient's  left side.  Unfortunately, no clearly identifiable cleavage plane was identified in order to safely dissect the tumor away from the thecal sac without  risking durotomy.  I was able to pass a ball-tipped dissector within the ventral epidural space and there did not appear to be any significant ventral compression.  With good circumferential bony and ligamentous decompression I elected not to aggressively dissect the epidural tumor away from the thecal sac.  At this point attention was turned to placement of the pedicle screws.  Using the previously placed K wires, screws were inserted bilaterally at T12, L1, L3, and L4.  Good purchase was noted at all the screws except for perhaps less than ideal purchase on the left at L1.  Intermittent lateral lateral fluoroscopy was used to ensure K wires were not advanced anteriorly.  Once pedicle screws were placed at all levels, a rod was sized to approximately 150 mm, and bent by hand to accommodate mild lumbar lordosis as well as minimal thoracic kyphosis.  Rods were then passed subfascially.  Setscrews were placed and final tightened.  Reduction towers were then removed.  Final AP and lateral fluoroscopy confirmed good location of the implanted hardware.  At this point, the high-speed drill was used to decorticate the remaining bone surfaces primarily at L2-3 and L1-2.  Demineralized bone matrix was then used to assist with posterolateral arthrodesis.  At this point hemostasis was secured using a combination of bipolar electrocautery and morselized Gelfoam and thrombin.  The midline fascial opening was then closed initially by reapproximating the muscle with interrupted 0 Vicryl stitches.  Fascia was then closed primarily with interrupted 0 Vicryl stitches including fascial stab incisions for the percutaneous screws.  Deep subcuticular interrupted 3-0 Vicryl stitches were placed, and the skin was closed with a running 0 Prolene stitch.  Bacitracin ointment and sterile dressings were applied.  At the end of the case all sponge, needle, and instrument counts were correct. The patient was then transferred to the  stretcher, extubated, and taken to the post-anesthesia care unit in stable hemodynamic condition.

## 2019-05-09 NOTE — Transfer of Care (Signed)
Immediate Anesthesia Transfer of Care Note  Patient: Rebecca Lucas  Procedure(s) Performed: Lumbar Two Laminectomy; Thoracic Twelve-Lumbar Four instrumented stabilization (N/A Spine Lumbar) APPLICATION OF ROBOTIC ASSISTANCE FOR SPINAL PROCEDURE (N/A Spine Lumbar)  Patient Location: PACU  Anesthesia Type:General  Level of Consciousness: awake, alert  and patient cooperative  Airway & Oxygen Therapy: Patient Spontanous Breathing  Post-op Assessment: Report given to RN and Post -op Vital signs reviewed and stable  Post vital signs: Reviewed and stable  Last Vitals:  Vitals Value Taken Time  BP 139/88 05/09/19 1547  Temp    Pulse 104 05/09/19 1552  Resp 14 05/09/19 1552  SpO2 95 % 05/09/19 1552  Vitals shown include unvalidated device data.  Last Pain:  Vitals:   05/09/19 0824  TempSrc: Oral  PainSc:       Patients Stated Pain Goal: 0 (79/44/46 1901)  Complications: No apparent anesthesia complications

## 2019-05-10 DIAGNOSIS — R945 Abnormal results of liver function studies: Secondary | ICD-10-CM

## 2019-05-10 LAB — BASIC METABOLIC PANEL
Anion gap: 9 (ref 5–15)
BUN: 21 mg/dL (ref 8–23)
CO2: 24 mmol/L (ref 22–32)
Calcium: 7.6 mg/dL — ABNORMAL LOW (ref 8.9–10.3)
Chloride: 107 mmol/L (ref 98–111)
Creatinine, Ser: 0.74 mg/dL (ref 0.44–1.00)
GFR calc Af Amer: >60 mL/min (ref >60)
GFR calc non Af Amer: >60 mL/min (ref >60)
Glucose, Bld: 115 mg/dL — ABNORMAL HIGH (ref 70–99)
Potassium: 4.9 mmol/L (ref 3.5–5.1)
Sodium: 140 mmol/L (ref 135–145)

## 2019-05-10 LAB — CBC
HCT: 34.2 % — ABNORMAL LOW (ref 36.0–46.0)
Hemoglobin: 11.3 g/dL — ABNORMAL LOW (ref 12.0–15.0)
MCH: 30.8 pg (ref 26.0–34.0)
MCHC: 33 g/dL (ref 30.0–36.0)
MCV: 93.2 fL (ref 80.0–100.0)
Platelets: 149 10*3/uL — ABNORMAL LOW (ref 150–400)
RBC: 3.67 MIL/uL — ABNORMAL LOW (ref 3.87–5.11)
RDW: 16.9 % — ABNORMAL HIGH (ref 11.5–15.5)
WBC: 26 10*3/uL — ABNORMAL HIGH (ref 4.0–10.5)
nRBC: 0.2 % (ref 0.0–0.2)

## 2019-05-10 LAB — GLUCOSE, CAPILLARY: Glucose-Capillary: 107 mg/dL — ABNORMAL HIGH (ref 70–99)

## 2019-05-10 LAB — PROTIME-INR
INR: 1 (ref 0.8–1.2)
Prothrombin Time: 13.5 seconds (ref 11.4–15.2)

## 2019-05-10 LAB — APTT: aPTT: 28 seconds (ref 24–36)

## 2019-05-10 MED ORDER — OXYCODONE HCL 5 MG PO TABS
10.0000 mg | ORAL_TABLET | ORAL | Status: DC | PRN
  Administered 2019-05-10 – 2019-05-14 (×18): 10 mg via ORAL
  Filled 2019-05-10 (×18): qty 2

## 2019-05-10 MED ORDER — IBUPROFEN 200 MG PO TABS
400.0000 mg | ORAL_TABLET | Freq: Three times a day (TID) | ORAL | Status: DC
  Administered 2019-05-10 – 2019-05-11 (×2): 400 mg via ORAL
  Filled 2019-05-10 (×7): qty 2

## 2019-05-10 MED ORDER — ACETAMINOPHEN 325 MG PO TABS
650.0000 mg | ORAL_TABLET | Freq: Four times a day (QID) | ORAL | Status: DC
  Administered 2019-05-10 – 2019-05-12 (×2): 650 mg via ORAL
  Filled 2019-05-10 (×5): qty 2

## 2019-05-10 NOTE — Progress Notes (Signed)
Orthopedic Tech Progress Note Patient Details:  Rebecca Lucas 11/13/1955 [id number]  Patient ID: [name], female   DOB: 1956-05-30, 63 y.o.   MRN: [id number]  Called in order to Williamson Karolee Stamps 05/10/2019, 9:36 AM

## 2019-05-10 NOTE — Evaluation (Signed)
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 S/P lumbar fusion/descompression, posterior segmental instrumentation T12-L4 with screws, posterolateral arthrodesis L1-L3, allograft. Came to hospital due to urinary retention. Patient has h/o radiation and chemotherapy.   Clinical Impression   Pt PTA: Pt was increasing need for ADL assist and mobility mostly transfers due to weakness and pain in LLE. Pt performing log roll with modA +2 and sit to stand with minA+2 and RW- manually moving LLE as pt only able to draw it backwards toward chair, not forward. Pt set-upA to minA for UB ADL and maxA to [place] for LB ADL. Pt taking a few steps from bed to recliner. Pt requires increased assist for all ADL and mobility. Pt would greatly benefit from continued OT skilled services for ADL, mobility and safety in CIR setting. OT following acutely.    Follow Up Recommendations  CIR    Equipment Recommendations  3 in 1 bedside commode    Recommendations for Other Services       Precautions / Restrictions Precautions Precautions: Fall;Back Precaution Booklet Issued: No Precaution Comments: educated patient on back precations and log rolling Required Braces or Orthoses: Spinal Brace Spinal Brace: Lumbar corset;Applied in sitting position Restrictions Weight Bearing Restrictions: No      Mobility Bed Mobility Overal bed mobility: Needs Assistance Bed Mobility: Rolling;Sidelying to Sit Rolling: +2 for physical assistance;Mod assist Sidelying to sit: +2 for physical assistance;Mod assist       General bed mobility comments: limited by pain  Transfers Overall transfer level: Needs assistance Equipment used: Rolling walker (2 wheeled) Transfers: Sit to/from Stand Sit to Stand: From elevated surface;+2 physical  assistance;Min assist              Balance Overall balance assessment: Needs assistance Sitting-balance support: Bilateral upper extremity supported;Feet supported Sitting balance-Leahy Scale: Fair     Standing balance support: Bilateral upper extremity supported;During functional activity Standing balance-Leahy Scale: Fair                             ADL either performed or assessed with clinical judgement   ADL Overall ADL's : Needs assistance/impaired Eating/Feeding: Set up;Sitting   Grooming: Set up;Sitting   Upper Body Bathing: Minimal assistance;Sitting   Lower Body Bathing: Maximal assistance;Total assistance;Sitting/lateral leans;Sit to/from stand;Bed level   Upper Body Dressing : Minimal assistance;Sitting   Lower Body Dressing: Maximal assistance;Sitting/lateral leans;Sit to/from stand   Toilet Transfer: Minimal assistance;+2 for physical assistance;+2 for safety/equipment;Stand-pivot;BSC   Toileting- Clothing Manipulation and Hygiene: Moderate assistance;+2 for physical assistance;+2 for safety/equipment;Sitting/lateral lean;Sit to/from stand       Functional mobility during ADLs: Minimal assistance;+2 for physical assistance;+2 for safety/equipment;Rolling walker General ADL Comments: Pt with increased pain, decreased mobility and increased assist required for all ADL.     Vision Baseline Vision/History: No visual deficits Vision Assessment?: No apparent visual deficits     Perception     Praxis      Pertinent Vitals/Pain Pain Assessment: 0-10 Pain Score: 8  Pain Descriptors / Indicators: Grimacing;Guarding;Discomfort;Operative site guarding;Aching;Constant Pain Intervention(s): Limited activity within patient's tolerance     Hand Dominance Right   Extremity/Trunk Assessment Upper Extremity Assessment Upper Extremity Assessment: Generalized weakness   Lower Extremity Assessment Lower Extremity Assessment: Defer to PT  evaluation LLE Deficits / Details:  R LE WNL. L LE weak/painful and requires assistance to move. LLE: Unable to fully assess due to pain LLE Sensation: WNL LLE Coordination: decreased gross motor;decreased fine motor   Cervical / Trunk Assessment Cervical / Trunk Assessment: Other exceptions Cervical / Trunk Exceptions: s/p back surgery   Communication Communication Communication: No difficulties   Cognition Arousal/Alertness: Awake/alert Behavior During Therapy: WFL for tasks assessed/performed Overall Cognitive Status: Within Functional Limits for tasks assessed                                     General Comments  Pt appeared anxious and when combing hair pt stating "do I have enough hair or should I wear my scarf?" Pt appeared in good spirits over condition.l    Exercises     Shoulder Instructions      Home Living Family/patient expects to be discharged to:: Private residence Living Arrangements: Spouse/significant other Available Help at Discharge: Family;Available PRN/intermittently                         Home Equipment: Walker - 2 wheels;Wheelchair - manual          Prior Functioning/Environment Level of Independence: Needs assistance  Gait / Transfers Assistance Needed: prior to admission patient reports she was unable to walk due to left LE weakness. She was able to transfer to wheelchair with assist. ADL's / Homemaking Assistance Needed: assistance needed Communication / Swallowing Assistance Needed: no assist needed          OT Problem List: Decreased strength;Decreased activity tolerance;Impaired balance (sitting and/or standing);Decreased safety awareness;Pain      OT Treatment/Interventions: Self-care/ADL training;Neuromuscular education;Energy conservation;Therapeutic exercise;DME and/or AE instruction;Therapeutic activities;Patient/family education;Balance training    OT Goals(Current goals can be found in the care plan  section) Acute Rehab OT Goals Patient Stated Goal: to decrease pain, get stronger OT Goal Formulation: With patient Time For Goal Achievement: 05/24/19 Potential to Achieve Goals: Good ADL Goals Pt Will Perform Grooming: with min guard assist;standing Pt Will Perform Upper Body Dressing: with min guard assist;standing Pt Will Perform Lower Body Dressing: with min guard assist;sitting/lateral leans;with adaptive equipment Pt Will Transfer to Toilet: with min guard assist;stand pivot transfer;bedside commode Pt Will Perform Toileting - Clothing Manipulation and hygiene: with min guard assist;sit to/from stand Additional ADL Goal #1: Pt will increase to minA overall for all OOB ADL with no verbal cues for precautions.  OT Frequency: Min 2X/week   Barriers to D/C:            Co-evaluation PT/OT/SLP Co-Evaluation/Treatment: Yes Reason for Co-Treatment: Complexity of the patient's impairments (multi-system involvement);For patient/therapist safety PT goals addressed during session: Mobility/safety with mobility;Proper use of DME OT goals addressed during session: ADL's and self-care      AM-PAC OT "6 Clicks" Daily Activity     Outcome Measure Help from another person eating meals?: None Help from another person taking care of personal grooming?: A Little Help from another person toileting, which includes using toliet, bedpan, or urinal?: A Lot Help from another person bathing (including washing, rinsing, drying)?: A Lot Help from another person to put on and taking off regular upper body clothing?: A Little Help from another person to put on and taking off regular lower body clothing?: A Lot 6 Click Score: 16   End of Session Equipment Utilized During Treatment: Gait belt;Rolling walker;Back brace Nurse Communication: Mobility  status  Activity Tolerance: Patient limited by pain Patient left: in chair;with call bell/phone within reach;with chair alarm set  OT Visit Diagnosis:  Unsteadiness on feet (R26.81);Muscle weakness (generalized) (M62.81);Pain Pain - part of body: (back)                Time: 1029-1100 OT Time Calculation (min): 31 min Charges:  OT General Charges $OT Visit: 1 Visit OT Evaluation $OT Eval Moderate Complexity: 1 Mod  Darryl Nestle) Marsa Aris OTR/L Acute Rehabilitation Services Pager: [phone] Office: [place] 05/10/2019, 2:30 PM

## 2019-05-10 NOTE — Progress Notes (Signed)
  NEUROSURGERY PROGRESS NOTE   No issues overnight.  Complains of significant back pain, unchanged from pre op although well controled with current regimen Nervous to work with therapy today No change in motor function  EXAM:  BP 126/83 (BP Location: Right Arm)   Pulse 100   Temp 98.9 F (37.2 C) (Axillary)   Resp (!) 22   Ht 5\' 7"  (1.702 m)   Wt 64.5 kg   SpO2 100%   BMI 22.27 kg/m   Awake, alert, oriented  Speech fluent, appropriate  CN grossly intact  5/5 BUE/RLE, LLE 0-1/5 proximal, 4-/5 distal   IMPRESSION/PLAN 63 y.o. female s/p L2 laminectomy for decompression and T12-L2 stabilization for tumor resection. Stable neurologically - Work with PT/OT

## 2019-05-10 NOTE — Progress Notes (Signed)
Rehab Admissions Coordinator Note:  Patient was screened by Michel Santee for appropriateness for an Inpatient Acute Rehab Consult.  At this time, we are recommending Inpatient Rehab consult. Please place an IP Rehab MD consult order if pt would like to be considered for program.  Michel Santee 05/10/2019, 3:29 PM  I can be reached at [phone].

## 2019-05-10 NOTE — Evaluation (Signed)
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 S/P lumbar fusion/descompression, posterior segmental instrumentation T12-L4 with screws, posterolateral arthrodesis L1-L3, allograft. Came to hospital due to urinary retention. Patient has h/o radiation and chemotherapy.  Clinical Impression  Patient received in bed, reports she is in a lot of pain 8/10. Had morphine prior to visit. Patient requires max cues for mobility at this time. She requires +2 assist due to pain and for safety with mobility. Required mod +2 assist for long rolling and sidelying to sit. Max assist to don lumbar brace in sitting. She is able to perform sit to stand transfer with bed elevated and min +2 assist. Ambulated from bed to recliner with max cues, assistance to advance L LE due to weakness +2 min assist for safety. Patient will benefit from continued skilled PT to address her weakness, decreased independence with bed mobility, transfers and ambulation.      Follow Up Recommendations CIR    Equipment Recommendations  None recommended by PT    Recommendations for Other Services Rehab consult     Precautions / Restrictions Precautions Precautions: Fall;Back Precaution Booklet Issued: No Precaution Comments: educated patient on back precations and log rolling Required Braces or Orthoses: Spinal Brace Spinal Brace: Lumbar corset;Applied in sitting position Restrictions Weight Bearing Restrictions: No      Mobility  Bed Mobility Overal bed mobility: Needs Assistance Bed Mobility: Rolling;Sidelying to Sit Rolling: +2 for physical assistance;Mod assist Sidelying to sit: +2 for physical assistance;Mod assist       General bed mobility comments: limited by pain  Transfers Overall transfer level: Needs assistance Equipment used:  Rolling walker (2 wheeled) Transfers: Sit to/from Stand Sit to Stand: From elevated surface;+2 physical assistance;Min assist            Ambulation/Gait Ambulation/Gait assistance: +2 physical assistance;Min assist;+2 safety/equipment Gait Distance (Feet): 4 Feet Assistive device: Rolling walker (2 wheeled) Gait Pattern/deviations: Step-to pattern;Decreased step length - left;Shuffle Gait velocity: decreased   General Gait Details: patient required assistance to advance left LE and to position L foot on floor during ambulation. May benefit from shoes next visit. She does not currently have a brace for left ankle/foot. Max cues for sequencing.  Stairs            Wheelchair Mobility    Modified Rankin (Stroke Patients Only)       Balance Overall balance assessment: Needs assistance Sitting-balance support: Bilateral upper extremity supported;Feet supported Sitting balance-Leahy Scale: Fair     Standing balance support: Bilateral upper extremity supported;During functional activity Standing balance-Leahy Scale: Fair                               Pertinent Vitals/Pain Pain Assessment: 0-10 Pain Score: 8  Pain Descriptors / Indicators: Grimacing;Guarding;Discomfort;Operative site guarding;Aching;Constant Pain Intervention(s): Limited activity within patient's tolerance;Monitored during session;Repositioned;Premedicated before session    [place] expects to be discharged to:: Private residence Living Arrangements: Spouse/significant other Available Help at Discharge: Family;Available PRN/intermittently           Home Equipment: Walker - 2 wheels;Wheelchair - manual      Prior Function Level of Independence: Needs assistance   Gait / Transfers Assistance Needed: prior to admission patient reports she was unable to walk due to left LE weakness.  She was able to transfer to wheelchair with assist.  ADL's / Homemaking Assistance  Needed: assistance needed        Hand Dominance        Extremity/Trunk Assessment   Upper Extremity Assessment Upper Extremity Assessment: Defer to OT evaluation    Lower Extremity Assessment Lower Extremity Assessment: LLE deficits/detail LLE Deficits / Details: R LE WNL. L LE weak/painful and requires assistance to move. LLE: Unable to fully assess due to pain LLE Sensation: WNL LLE Coordination: decreased gross motor;decreased fine motor    Cervical / Trunk Assessment Cervical / Trunk Assessment: (surgical incision, minimal drainage present on bandage. RN observed when patient sat up.)  Communication   Communication: No difficulties  Cognition Arousal/Alertness: Awake/alert Behavior During Therapy: WFL for tasks assessed/performed Overall Cognitive Status: Within Functional Limits for tasks assessed                                        General Comments      Exercises     Assessment/Plan    PT Assessment Patient needs continued PT services  PT Problem List Decreased strength;Decreased mobility;Decreased activity tolerance;Decreased balance;Pain;Decreased knowledge of precautions;Decreased range of motion       PT Treatment Interventions DME instruction;Therapeutic activities;Gait training;Therapeutic exercise;Patient/family education;Functional mobility training;Balance training    PT Goals (Current goals can be found in the Care Plan section)  Acute Rehab PT Goals Patient Stated Goal: to decrease pain, get stronger PT Goal Formulation: With patient Time For Goal Achievement: 05/17/19 Potential to Achieve Goals: Good    Frequency Min 4X/week   Barriers to discharge Decreased caregiver support      Co-evaluation PT/OT/SLP Co-Evaluation/Treatment: Yes Reason for Co-Treatment: For patient/therapist safety;Complexity of the patient's impairments (multi-system involvement);To address functional/ADL transfers PT goals addressed during  session: Mobility/safety with mobility;Proper use of DME         AM-PAC PT "6 Clicks" Mobility  Outcome Measure Help needed turning from your back to your side while in a flat bed without using bedrails?: A Lot Help needed moving from lying on your back to sitting on the side of a flat bed without using bedrails?: A Lot Help needed moving to and from a bed to a chair (including a wheelchair)?: A Lot Help needed standing up from a chair using your arms (e.g., wheelchair or bedside chair)?: A Little Help needed to walk in hospital room?: A Lot Help needed climbing 3-5 steps with a railing? : A Lot 6 Click Score: 13    End of Session Equipment Utilized During Treatment: Gait belt;Back brace Activity Tolerance: Patient limited by pain;Patient tolerated treatment well Patient left: in chair;with chair alarm set;with call bell/phone within reach Nurse Communication: Mobility status PT Visit Diagnosis: Other abnormalities of gait and mobility (R26.89);Muscle weakness (generalized) (M62.81);Difficulty in walking, not elsewhere classified (R26.2);Pain;Unsteadiness on feet (R26.81) Pain - Right/Left: (back)    Time: 1030-1058 PT Time Calculation (min) (ACUTE ONLY): 28 min   Charges:   PT Evaluation $PT Eval High Complexity: 1 High PT Treatments $Gait Training: 8-22 mins        Brissia Delisa, PT, GCS 05/10/19,12:07 PM

## 2019-05-10 NOTE — Anesthesia Postprocedure Evaluation (Signed)
Anesthesia Post Note  Patient: RAECHELL SINGLETON  Procedure(s) Performed: Lumbar Two Laminectomy; Thoracic Twelve-Lumbar Four instrumented stabilization (N/A Spine Lumbar) APPLICATION OF ROBOTIC ASSISTANCE FOR SPINAL PROCEDURE (N/A Spine Lumbar)     Patient location during evaluation: PACU Anesthesia Type: General Level of consciousness: awake and alert Pain management: pain level controlled Vital Signs Assessment: post-procedure vital signs reviewed and stable Respiratory status: spontaneous breathing, nonlabored ventilation, respiratory function stable and patient connected to nasal cannula oxygen Cardiovascular status: blood pressure returned to baseline and stable Postop Assessment: no apparent nausea or vomiting Anesthetic complications: no    Last Vitals:  Vitals:   05/09/19 2359 05/10/19 0419  BP: 121/78 128/84  Pulse: 93 87  Resp: 16 17  Temp: 37.1 C 36.9 C  SpO2: 98% 98%    Last Pain:  Vitals:   05/10/19 0635  TempSrc:   PainSc: 8                  Markevion Lattin

## 2019-05-10 NOTE — Progress Notes (Signed)
PROGRESS NOTE    Rebecca Lucas  UMP:[id number] DOB: [birth year] DOA: 05/07/2019 PCP: Caren Macadam, MD    Brief Narrative:  63 year old female who presented with urinary retention. 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 Patient status post palliative radiotherapy to the lumbar area and currently on chemotherapy. Patient reported 4 to 5 days of progressive difficulty urinating, 24 hours prior to hospitalization she developed suprapubic pain and urgency. Her symptoms were associated) to weakness and numbness, difficulty ambulation. On her initial physical examination blood pressure 103/57, heart rate 120, respiratory rate 17, temperature 99, oxygen saturation 100%. Moist mucous membranes, lungs clear to auscultation bilaterally, heart S1-S2 present rhythm, abdomen soft, no lower extremity edema.Left lower extremity strength 0 out of 5, proximal, distal 2-5. MRI of the lumbar spine with progressive bulky spinal paraspinal tumor, L1-L3. Progressive moderate to severe spinal canal stenosis and severe left lateral recess stenosis at L1-L2 and L2-L3.  Patient was admitted to the hospital with a working diagnosis of focal neurologic deficit duemalignancyrelated spinal cord compression.    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 Malignancy related cord compression. L1-L3.  Uncontrolled pain, despite fentanyl patch as needed oxycodone and IV morphine. Will schedule acetaminophen and ibuprofen, increase dose and frequency of oxycodone to 10 mg as needed q 4 H, and continue with as needed IV morphine. Continue with systemic steroids. Physical therapy per neurosurgery recommendations.    2.  Urinary retention. Continue urine output monitoring.   3. 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 Oncology follow up.  4. Reactive leukocytosis. WBC 26, likely steroid induced, continue to hold on antibiotic therapy, follow cell count in am.   DVT prophylaxis:scd Code Status:full Family Communication:I spoke with patient's husband at the bedside and all questions were addressed.    Body mass index is 22.27 kg/m. Malnutrition Type:      Malnutrition Characteristics:      Nutrition Interventions:     RN Pressure Injury Documentation:     Consultants:   Neurosurgery   Oncology   Procedures:   L2 laminectomy   Antimicrobials:       Subjective: Patient continue to have back pain 8/10 constant, despite fentanyl patch, no nausea or vomiting, tolerating po well. No chest pain or dyspnea.   Objective: Vitals:   05/10/19 0804 05/10/19 0902 05/10/19 1255 05/10/19 1317  BP: (!) 141/92 138/85 126/83   Pulse: (!) 101 96 (!) 111 100  Resp: 20  (!) 28 (!) 22  Temp: 99.1 F (37.3 C)  98.9 F (37.2 C)   TempSrc: Oral  Axillary   SpO2: 97%  100% 100%  Weight:      Height:        Intake/Output Summary (Last 24 hours) at 05/10/2019 1340 Last data filed at 05/10/2019 1122 Gross per 24 hour  Intake 1704.58 ml  Output 1210 ml  Net 494.58 ml   Filed Weights   05/08/19 0425 05/09/19 0500 05/10/19 0415  Weight: 58.9 kg 58.1 kg 64.5 kg    Examination:   General: Not in pain or dyspnea, deconditioned and in pain.  Neurology:  Awake and alert, non focal  E ENT: positive pallor, no icterus, oral mucosa moist Cardiovascular: No JVD. S1-S2 present, rhythmic, no gallops, rubs, or murmurs. No lower extremity edema. Pulmonary:  positive breath sounds bilaterally, adequate air movement, no wheezing, rhonchi or rales. Gastrointestinal. Abdomen with no  organomegaly, non tender, no rebound or guarding Skin. No rashes Musculoskeletal: no joint deformities     Data Reviewed: I have personally reviewed following labs and imaging studies  CBC: Recent Labs  Lab 05/07/19 1005 05/07/19 2219 05/08/19 1752 05/09/19 1350 05/09/19 1522 05/10/19 0424  WBC 14.7*  --  24.1*  --   --  26.0*  NEUTROABS 13.1*  --   --   --   --   --   HGB 8.8*  --  8.2* 6.8* 10.2* 11.3*  HCT 28.3*  --  25.4* 20.0* 30.0* 34.2*  MCV 101.4*  --  97.3  --   --  93.2  PLT PLATELET CLUMPS NOTED ON SMEAR, UNABLE TO ESTIMATE 68* 160  --   --  557*   Basic Metabolic Panel: Recent Labs  Lab 05/07/19 1005 05/09/19 1350 05/09/19 1522 05/10/19 0424  NA 136 140 138 140  K 4.0 4.3 4.8 4.9  CL 104  --   --  107  CO2 24  --   --  24  GLUCOSE 124*  --  133* 115*  BUN 21  --   --  21  CREATININE 0.73  --   --  0.74  CALCIUM 8.8*  --   --  7.6*   GFR: Estimated Creatinine Clearance: 71.8 mL/min (by C-G formula based on SCr of 0.74 mg/dL). Liver Function Tests: Recent Labs  Lab 05/07/19 1005  AST 47*  ALT 79*  ALKPHOS 200*  BILITOT <0.1*  PROT 5.6*  ALBUMIN 2.5*   No results for input(s): LIPASE, AMYLASE in the last 168 hours. No results for input(s): AMMONIA in the last 168 hours. Coagulation Profile: Recent Labs  Lab 05/10/19 0424  INR 1.0   Cardiac Enzymes: No results for input(s): CKTOTAL, CKMB, CKMBINDEX, TROPONINI in the last 168 hours. BNP (last 3 results) No results for input(s): PROBNP in the last 8760 hours. HbA1C: No results for input(s): HGBA1C in the last 72 hours. CBG: Recent Labs  Lab 05/08/19 1139 05/09/19 0821 05/10/19 0607  GLUCAP 109* 124* 107*   Lipid Profile: No results for input(s): CHOL, HDL, LDLCALC, TRIG, CHOLHDL, LDLDIRECT in the last 72 hours. Thyroid Function Tests: No results for input(s): TSH, T4TOTAL, FREET4, T3FREE, THYROIDAB in the last 72 hours. Anemia Panel: No results for input(s): VITAMINB12, FOLATE,  FERRITIN, TIBC, IRON, RETICCTPCT in the last 72 hours.    Radiology Studies: I have reviewed all of the imaging during this hospital visit personally     Scheduled Meds: . dexamethasone (DECADRON) injection  6 mg Intravenous Q6H  . fentaNYL  1 patch Transdermal Q72H  . gabapentin  300 mg Oral TID  . heparin lock flush  500 Units Intracatheter Q30 days  . linaclotide  72 mcg Oral QAC breakfast  . pantoprazole  40 mg Oral Daily  . sodium chloride flush  10-40 mL Intracatheter Q12H  . sodium chloride flush  3 mL Intravenous Q12H  . sodium chloride flush  3 mL Intravenous Q12H   Continuous Infusions: . sodium chloride Stopped (05/10/19 1033)  . sodium chloride    . lactated ringers 10 mL/hr at 05/09/19 0942     LOS: 3 days  Georgianne Gritz Gerome Apley, MD

## 2019-05-11 LAB — CBC WITH DIFFERENTIAL/PLATELET
Abs Immature Granulocytes: 1.05 10*3/uL — ABNORMAL HIGH (ref 0.00–0.07)
Basophils Absolute: 0.1 10*3/uL (ref 0.0–0.1)
Basophils Relative: 0 %
Eosinophils Absolute: 0 10*3/uL (ref 0.0–0.5)
Eosinophils Relative: 0 %
HCT: 29.3 % — ABNORMAL LOW (ref 36.0–46.0)
Hemoglobin: 9.7 g/dL — ABNORMAL LOW (ref 12.0–15.0)
Immature Granulocytes: 4 %
Lymphocytes Relative: 0 %
Lymphs Abs: 0.1 10*3/uL — ABNORMAL LOW (ref 0.7–4.0)
MCH: 31.4 pg (ref 26.0–34.0)
MCHC: 33.1 g/dL (ref 30.0–36.0)
MCV: 94.8 fL (ref 80.0–100.0)
Monocytes Absolute: 0.7 10*3/uL (ref 0.1–1.0)
Monocytes Relative: 2 %
Neutro Abs: 25 10*3/uL — ABNORMAL HIGH (ref 1.7–7.7)
Neutrophils Relative %: 94 %
Platelets: UNDETERMINED 10*3/uL (ref 150–400)
RBC: 3.09 MIL/uL — ABNORMAL LOW (ref 3.87–5.11)
RDW: 16.9 % — ABNORMAL HIGH (ref 11.5–15.5)
WBC: 27 10*3/uL — ABNORMAL HIGH (ref 4.0–10.5)
nRBC: 0.1 % (ref 0.0–0.2)

## 2019-05-11 LAB — BASIC METABOLIC PANEL
Anion gap: 5 (ref 5–15)
BUN: 18 mg/dL (ref 8–23)
CO2: 24 mmol/L (ref 22–32)
Calcium: 7.3 mg/dL — ABNORMAL LOW (ref 8.9–10.3)
Chloride: 110 mmol/L (ref 98–111)
Creatinine, Ser: 0.57 mg/dL (ref 0.44–1.00)
GFR calc Af Amer: >60 mL/min (ref >60)
GFR calc non Af Amer: >60 mL/min (ref >60)
Glucose, Bld: 119 mg/dL — ABNORMAL HIGH (ref 70–99)
Potassium: 4.8 mmol/L (ref 3.5–5.1)
Sodium: 139 mmol/L (ref 135–145)

## 2019-05-11 LAB — GLUCOSE, CAPILLARY: Glucose-Capillary: 101 mg/dL — ABNORMAL HIGH (ref 70–99)

## 2019-05-11 MED ORDER — POLYETHYLENE GLYCOL 3350 17 G PO PACK
17.0000 g | PACK | Freq: Two times a day (BID) | ORAL | Status: DC
  Administered 2019-05-11 – 2019-05-13 (×4): 17 g via ORAL
  Filled 2019-05-11 (×6): qty 1

## 2019-05-11 NOTE — Progress Notes (Signed)
Patient ID: Rebecca Lucas, female   DOB: 11-14-1955, 63 y.o.   MRN: [id number] Vital signs are stable Motor function is good in the lower extremities Hoping to get Foley catheter out today Doing well postop day 1 from spinal decompression

## 2019-05-11 NOTE — Progress Notes (Signed)
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 S/P lumbar fusion/descompression, posterior segmental instrumentation T12-L4 with screws, posterolateral arthrodesis L1-L3, allograft. Came to hospital due to urinary retention. Patient has h/o radiation and chemotherapy.    PT Comments    Patient seen for mobility progression. Current plan remains appropriate.    Follow Up Recommendations  CIR     Equipment Recommendations  None recommended by PT    Recommendations for Other Services       Precautions / Restrictions Precautions Precautions: Fall;Back Precaution Booklet Issued: No Precaution Comments: educated patient on back precations and log rolling Required Braces or Orthoses: Spinal Brace Spinal Brace: Lumbar corset;Applied in sitting position Restrictions Weight Bearing Restrictions: No    Mobility  Bed Mobility Overal bed mobility: Needs Assistance Bed Mobility: Rolling;Sidelying to Sit Rolling: +2 for physical assistance;Min assist Sidelying to sit: +2 for physical assistance;Mod assist       General bed mobility comments: cues for sequencing and technique; assistance at trunk and bilat LE required  Transfers Overall transfer level: Needs assistance Equipment used: Rolling walker (2 wheeled) Transfers: Sit to/from Stand Sit to Stand: From elevated surface;+2 physical assistance;Min assist         General transfer comment: cues for safe hand placement; assist to position L LE prior to stand and to power up   Ambulation/Gait Ambulation/Gait assistance: +2 physical assistance;+2 safety/equipment;Mod assist;Min assist Gait Distance (Feet): (~4 ft total) Assistive device: Rolling walker (2 wheeled) Gait Pattern/deviations: Step-to pattern;Decreased step length -  left;Shuffle;Decreased dorsiflexion - left Gait velocity: decreased   General Gait Details: cues for upright posture and sequencing; pt requires physical assistance to advance L LE when stepping forward and slides L foot when going backwards; assist to steady and guide RW    Stairs             Wheelchair Mobility    Modified Rankin (Stroke Patients Only)       Balance Overall balance assessment: Needs assistance Sitting-balance support: Bilateral upper extremity supported;Feet supported Sitting balance-Leahy Scale: Poor     Standing balance support: Bilateral upper extremity supported;During functional activity Standing balance-Leahy Scale: Poor                              Cognition Arousal/Alertness: Awake/alert Behavior During Therapy: WFL for tasks assessed/performed Overall Cognitive Status: Within Functional Limits for tasks assessed                                        Exercises      General Comments        Pertinent Vitals/Pain Pain Assessment: 0-10 Pain Score: 8  Pain Location: grossly at surgical site/back; pain in L LE with some movements Pain Descriptors / Indicators: Grimacing;Guarding;Aching;Operative site guarding;Sore Pain Intervention(s): Limited activity within patient's tolerance;Monitored during session;Repositioned;Premedicated before session    Home Living                      Prior Function            PT Goals (current goals can now be found in the care plan section) Acute Rehab PT Goals Patient Stated Goal: to decrease pain, get  stronger Progress towards PT goals: Progressing toward goals    Frequency    Min 4X/week      PT Plan Current plan remains appropriate    Co-evaluation              AM-PAC PT "6 Clicks" Mobility   Outcome Measure  Help needed turning from your back to your side while in a flat bed without using bedrails?: A Lot Help needed moving from lying on  your back to sitting on the side of a flat bed without using bedrails?: A Lot Help needed moving to and from a bed to a chair (including a wheelchair)?: A Lot Help needed standing up from a chair using your arms (e.g., wheelchair or bedside chair)?: A Little Help needed to walk in hospital room?: A Lot Help needed climbing 3-5 steps with a railing? : Total 6 Click Score: 12    End of Session Equipment Utilized During Treatment: Gait belt;Back brace Activity Tolerance: Patient limited by pain Patient left: in chair;with chair alarm set;with call bell/phone within reach Nurse Communication: Mobility status PT Visit Diagnosis: Other abnormalities of gait and mobility (R26.89);Muscle weakness (generalized) (M62.81);Difficulty in walking, not elsewhere classified (R26.2);Pain;Unsteadiness on feet (R26.81) Pain - Right/Left: (back)     Time: 2409-7353 PT Time Calculation (min) (ACUTE ONLY): 22 min  Charges:  $Gait Training: 8-22 mins                     Earney Navy, PTA Acute Rehabilitation Services Pager: [phone] Office: [phone]     Darliss Cheney 05/11/2019, 10:36 AM

## 2019-05-11 NOTE — Progress Notes (Addendum)
PROGRESS NOTE    Rebecca Lucas  JWJ:[id number] DOB: [birth year] DOA: 05/07/2019 PCP: Caren Macadam, MD    Brief Narrative:  63 year old female who presented with urinary retention. 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 Patient status post palliative radiotherapy to the lumbar area and currently on chemotherapy. Patient reported 4 to 5 days of progressive difficulty urinating, 24 hours prior to hospitalization she developed suprapubic pain and urgency. Her symptoms were associated) to weakness and numbness, difficulty ambulation. On her initial physical examination blood pressure 103/57, heart rate 120, respiratory rate 17, temperature 99, oxygen saturation 100%. Moist mucous membranes, lungs clear to auscultation bilaterally, heart S1-S2 present rhythm, abdomen soft, no lower extremity edema.Left lower extremity strength 0 out of 5, proximal, distal 2-5. MRI of the lumbar spine with progressive bulky spinal paraspinal tumor, L1-L3. Progressive moderate to severe spinal canal stenosis and severe left lateral recess stenosis at L1-L2 and L2-L3.  Patient was admitted to the hospital with a working diagnosis of focal neurologic deficit duemalignancyrelated spinal cord compression.    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 Malignancy related cord compression. L1-L3.Improved pain control with fentanyl patch, scheduled acetaminophen and ibuprofen, continue as needed morphine and oxycodone. On systemic steroids. Continue dvt prophylaxis and physical therapy evaluation. Follow with neurosurgery recommendations, when to resume enoxaparin. Will add bowel regimen with bid miralax.   2.  Urinary retention.Foley catheter removed today, continue to monitor bladder scans.   3. 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  4. Reactive leukocytosis. Persistent leukocytosis at WBC 27, suspected steroid induced. Continue to hold on antibiotic therapy.    5. 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 Stable cell count with Hgb at 9,7 and Hct at 29.3  DVT prophylaxis:scd Code Status:full Family Communication:I spoke with patient'shusbandat the bedside and all questions were addressed.    Body mass index is 22.34 kg/m. Malnutrition Type:      Malnutrition Characteristics:      Nutrition Interventions:     RN Pressure Injury Documentation:     Consultants:   Neurosurgery   Oncology   Procedures:   L2 Laminectomy   Antimicrobials:       Subjective: Patient is feeling better, her back pain has improved but not yet back to baseline, today at 8/10, worse with movement and dull in nature. Foley catheter has been removed.   Objective: Vitals:   05/11/19 0324 05/11/19 0358 05/11/19 0815 05/11/19 1119  BP:  111/64 132/78 128/82  Pulse:  89 (!) 107 (!) 105  Resp:  20 20 20   Temp:  98.5 F (36.9 C) 98.9 F (37.2 C) 98.1 F (36.7 C)  TempSrc:  Oral Oral Oral  SpO2:  97% 98% 98%  Weight: 64.7 kg     Height:        Intake/Output Summary (Last 24 hours) at 05/11/2019 1318 Last data filed at 05/11/2019 1119 Gross per 24 hour  Intake -  Output 1900 ml  Net -1900 ml   Filed Weights   05/09/19 0500 05/10/19 0415 05/11/19 0324  Weight: 58.1 kg 64.5 kg 64.7 kg    Examination:   General: Not in pain or dyspnea, deconditioned  Neurology: Awake and alert, non focal  E ENT: mild pallor, no icterus, oral mucosa moist Cardiovascular: No JVD. S1-S2 present, rhythmic,  no gallops, rubs, or murmurs. Pitting bilateral lower extremity edema, more left than right.  Pulmonary: positive breath sounds bilaterally, adequate air movement, no wheezing, rhonchi or rales. Gastrointestinal. Abdomen with no organomegaly, non tender, no rebound or guarding Skin. No rashes Musculoskeletal: no joint deformities     Data Reviewed: I have personally reviewed following labs and imaging studies  CBC: Recent Labs  Lab 05/07/19 1005 05/07/19 2219 05/08/19 1752 05/09/19 1350 05/09/19 1522 05/10/19 0424 05/11/19 0451  WBC 14.7*  --  24.1*  --   --  26.0* 27.0*  NEUTROABS 13.1*  --   --   --   --   --  25.0*  HGB 8.8*  --  8.2* 6.8* 10.2* 11.3* 9.7*  HCT 28.3*  --  25.4* 20.0* 30.0* 34.2* 29.3*  MCV 101.4*  --  97.3  --   --  93.2 94.8  PLT PLATELET CLUMPS NOTED ON SMEAR, UNABLE TO ESTIMATE 68* 160  --   --  149* PLATELET CLUMPS NOTED ON SMEAR, UNABLE TO ESTIMATE   Basic Metabolic Panel: Recent Labs  Lab 05/07/19 1005 05/09/19 1350 05/09/19 1522 05/10/19 0424 05/11/19 0451  NA 136 140 138 140 139  K 4.0 4.3 4.8 4.9 4.8  CL 104  --   --  107 110  CO2 24  --   --  24 24  GLUCOSE 124*  --  133* 115* 119*  BUN 21  --   --  21 18  CREATININE 0.73  --   --  0.74 0.57  CALCIUM 8.8*  --   --  7.6* 7.3*   GFR: Estimated Creatinine Clearance: 71.8 mL/min (by C-G formula based on SCr of 0.57 mg/dL). Liver Function Tests: Recent Labs  Lab 05/07/19 1005  AST 47*  ALT 79*  ALKPHOS 200*  BILITOT <0.1*  PROT 5.6*  ALBUMIN 2.5*   No results for input(s): LIPASE, AMYLASE in the last 168 hours. No results for input(s): AMMONIA in the last 168 hours. Coagulation Profile: Recent Labs  Lab 05/10/19 0424  INR 1.0   Cardiac Enzymes: No results for input(s): CKTOTAL, CKMB, CKMBINDEX, TROPONINI in the last 168 hours. BNP (last 3 results) No results for input(s): PROBNP in the last 8760 hours. HbA1C: No results for input(s): HGBA1C in the last 72 hours. CBG:  Recent Labs  Lab 05/08/19 1139 05/09/19 0821 05/10/19 0607 05/11/19 0742  GLUCAP 109* 124* 107* 101*   Lipid Profile: No results for input(s): CHOL, HDL, LDLCALC, TRIG, CHOLHDL, LDLDIRECT in the last 72 hours. Thyroid Function Tests: No results for input(s): TSH, T4TOTAL, FREET4, T3FREE, THYROIDAB in the last 72 hours. Anemia Panel: No results for input(s): VITAMINB12, FOLATE, FERRITIN, TIBC, IRON, RETICCTPCT in the last 72 hours.    Radiology Studies: I have reviewed all of the imaging during this hospital visit personally     Scheduled Meds: . acetaminophen  650 mg Oral Q6H  . dexamethasone (DECADRON) injection  6 mg Intravenous Q6H  . fentaNYL  1 patch Transdermal Q72H  . gabapentin  300 mg Oral TID  . heparin lock flush  500 Units Intracatheter Q30 days  . ibuprofen  400 mg Oral TID  . linaclotide  72 mcg Oral QAC breakfast  . pantoprazole  40 mg Oral Daily  . sodium chloride flush  10-40 mL Intracatheter Q12H  . sodium chloride flush  3 mL Intravenous Q12H  . sodium chloride flush  3 mL Intravenous Q12H   Continuous Infusions: . sodium chloride Stopped (05/10/19 1033)  . sodium chloride    . lactated ringers 10 mL/hr at 05/09/19 2376     LOS: 4 days         Gerome Apley, MD

## 2019-05-11 NOTE — Plan of Care (Signed)
  Problem: Activity: Goal: Risk for activity intolerance will decrease Outcome: Progressing Note: Pt has been able to sit in chair for a few hours today during my care.    Problem: Elimination: Goal: Will not experience complications related to urinary retention Outcome: Progressing Note: Pt requested for foley to be d/c'd and trial voiding during my shift. Foley catheter was d/c'd at 1140 and currently waiting for pt to void.    Problem: Pain Managment: Goal: General experience of comfort will improve Outcome: Progressing Note: Pt continues to require PRN pain medications when they are due during my care. Pt rates her pain at a 6/10.    Problem: Safety: Goal: Ability to remain free from injury will improve Outcome: Progressing Note: Pt has remained free from falls during my care. Pt has called appropriately to use restroom and any other assistance needed.

## 2019-05-12 DIAGNOSIS — R29898 Other symptoms and signs involving the musculoskeletal system: Secondary | ICD-10-CM

## 2019-05-12 DIAGNOSIS — R270 Ataxia, unspecified: Secondary | ICD-10-CM

## 2019-05-12 LAB — CBC WITH DIFFERENTIAL/PLATELET
Abs Immature Granulocytes: 0.3 10*3/uL — ABNORMAL HIGH (ref 0.00–0.07)
Basophils Absolute: 0 10*3/uL (ref 0.0–0.1)
Basophils Relative: 0 %
Eosinophils Absolute: 0 10*3/uL (ref 0.0–0.5)
Eosinophils Relative: 0 %
HCT: 29.5 % — ABNORMAL LOW (ref 36.0–46.0)
Hemoglobin: 9.5 g/dL — ABNORMAL LOW (ref 12.0–15.0)
Lymphocytes Relative: 5 %
Lymphs Abs: 1.4 10*3/uL (ref 0.7–4.0)
MCH: 31.4 pg (ref 26.0–34.0)
MCHC: 32.2 g/dL (ref 30.0–36.0)
MCV: 97.4 fL (ref 80.0–100.0)
Metamyelocytes Relative: 1 %
Monocytes Absolute: 0.3 10*3/uL (ref 0.1–1.0)
Monocytes Relative: 1 %
Neutro Abs: 26.3 10*3/uL — ABNORMAL HIGH (ref 1.7–7.7)
Neutrophils Relative %: 93 %
Platelets: 129 10*3/uL — ABNORMAL LOW (ref 150–400)
RBC: 3.03 MIL/uL — ABNORMAL LOW (ref 3.87–5.11)
RDW: 16.5 % — ABNORMAL HIGH (ref 11.5–15.5)
WBC: 28.3 10*3/uL — ABNORMAL HIGH (ref 4.0–10.5)
nRBC: 0 /100 WBC
nRBC: 0.1 % (ref 0.0–0.2)

## 2019-05-12 LAB — TYPE AND SCREEN
ABO/RH(D): O NEG
Antibody Screen: NEGATIVE
Unit division: 0
Unit division: 0
Unit division: 0
Unit division: 0

## 2019-05-12 LAB — BASIC METABOLIC PANEL
Anion gap: 6 (ref 5–15)
BUN: 17 mg/dL (ref 8–23)
CO2: 23 mmol/L (ref 22–32)
Calcium: 7.3 mg/dL — ABNORMAL LOW (ref 8.9–10.3)
Chloride: 108 mmol/L (ref 98–111)
Creatinine, Ser: 0.54 mg/dL (ref 0.44–1.00)
GFR calc Af Amer: >60 mL/min (ref >60)
GFR calc non Af Amer: >60 mL/min (ref >60)
Glucose, Bld: 118 mg/dL — ABNORMAL HIGH (ref 70–99)
Potassium: 4.8 mmol/L (ref 3.5–5.1)
Sodium: 137 mmol/L (ref 135–145)

## 2019-05-12 LAB — BPAM RBC
Blood Product Expiration Date: 202009022359
Blood Product Expiration Date: 202009092359
Blood Product Expiration Date: 202009122359
Blood Product Expiration Date: 202009122359
ISSUE DATE / TIME: 202008271359
ISSUE DATE / TIME: 202008271359
Unit Type and Rh: 9500
Unit Type and Rh: 9500
Unit Type and Rh: 9500
Unit Type and Rh: 9500

## 2019-05-12 LAB — GLUCOSE, CAPILLARY: Glucose-Capillary: 118 mg/dL — ABNORMAL HIGH (ref 70–99)

## 2019-05-12 NOTE — Progress Notes (Signed)
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 S/P lumbar fusion/descompression, posterior segmental instrumentation T12-L4 with screws, posterolateral arthrodesis L1-L3, allograft. Came to hospital due to urinary retention. Patient has h/o radiation and chemotherapy.   OT comments  Pt. Seen for skilled OT treatment session.  Motivated for participation and making gains.  Bed mobility requiring varying levels of mod/max a for eob then back to bed mostly to manage BLEs. L greater than R.  Remains guarded with sitting balance but able to maintain with single ue support this session.  Remains excellent CIR candidate. Is motivated and expresses wanting to continue to progress and get more "confident" with mobility and ease burden of care on her husband.    Follow Up Recommendations  CIR    Equipment Recommendations  3 in 1 bedside commode    Recommendations for Other Services      Precautions / Restrictions Precautions Precautions: Fall;Back Precaution Comments: educated patient on back precations and log rolling-pt. also stated 3/3 precautions Required Braces or Orthoses: Spinal Brace Spinal Brace: Lumbar corset;Applied in sitting position       Mobility Bed Mobility Overal bed mobility: Needs Assistance Bed Mobility: Rolling;Sidelying to Sit Rolling: Mod assist Sidelying to sit: Mod assist       General bed mobility comments: cues for sequencing and technique; pt. able to reach for bed rails to initiate roll to R.  therapist asst. guided L LE and pt. was able to manage RLE off of the bed. pt. was able to bring trunk upright without physical assistance.  mod a to position BLEs around tray table for seated grooming task. pt. able to transition into side lying use of bed rail, max a to bring b les into  bed. min a to roll into supine. cues not to twist. max a to position LLE once supine.  Transfers                 General transfer comment: pt. completed multiple partial sit/stands with mod support of bles on flooe to scoot towards hob in preparation to lie back down at end of session    Balance Overall balance assessment: Needs assistance Sitting-balance support: Single extremity supported;Feet supported;Feet unsupported Sitting balance-Leahy Scale: Fair                                     ADL either performed or assessed with clinical judgement   ADL Overall ADL's : Needs assistance/impaired     Grooming: Wash/dry face;Oral care;Brushing hair;Min guard;Sitting;Cueing for compensatory techniques Grooming Details (indicate cue type and reason): pt. reports only feeling comfortable at this time having UE support while completing tasks.  would keep mostly RUE support on bed while using LUE to comb hair, brush teeth, wash face         Upper Body Dressing : Minimal assistance;Sitting;Cueing for sequencing Upper Body Dressing Details (indicate cue type and reason): cues for sequencing of straps and velcros of brace                   General ADL Comments: Pt. progressing.  states "i have to get more confident" in regards to sitting eob and challenging balance.  relies heavy on ue support due to pain and also not feeling  steady     Manufacturing systems engineer      Cognition Arousal/Alertness: Awake/alert Behavior During Therapy: WFL for tasks assessed/performed Overall Cognitive Status: Within Functional Limits for tasks assessed                                          Exercises     Shoulder Instructions       General Comments  spoke about loss of hair. Is eager to "shave it all off already" spoke of not wanting to have bald spots or be stuck "in between stages" does not like or wants wigs. Says she has very pretty  scarves that she is looking forward to wearing. Spoke highly of her husband and talked about the burden of being a caregiver and wanting to make sure he has time for himself too.  Also discussed wanting to get more confident and trust her balance to participate and do more for herself.  Loves to read, Renetta Chalk is her favorite. Also likes her morning coffee.      Pertinent Vitals/ Pain       Pain Assessment: 0-10 Pain Score: 5  Pain Location: back Pain Descriptors / Indicators: Aching;Grimacing Pain Intervention(s): Limited activity within patient's tolerance;Monitored during session;Premedicated before session;Repositioned  Home Living                                          Prior Functioning/Environment              Frequency  Min 2X/week        Progress Toward Goals  OT Goals(current goals can now be found in the care plan section)  Progress towards OT goals: Progressing toward goals     Plan      Co-evaluation                 AM-PAC OT "6 Clicks" Daily Activity     Outcome Measure   Help from another person eating meals?: None Help from another person taking care of personal grooming?: A Little Help from another person toileting, which includes using toliet, bedpan, or urinal?: A Lot Help from another person bathing (including washing, rinsing, drying)?: A Lot Help from another person to put on and taking off regular upper body clothing?: A Little Help from another person to put on and taking off regular lower body clothing?: A Lot 6 Click Score: 16    End of Session Equipment Utilized During Treatment: Back brace  OT Visit Diagnosis: Unsteadiness on feet (R26.81);Muscle weakness (generalized) (M62.81);Pain   Activity Tolerance Patient tolerated treatment well   Patient Left in bed;with call bell/phone within reach;with bed alarm set   Nurse Communication          Time: 7824-2353 OT Time Calculation (min): 22  min  Charges: OT General Charges $OT Visit: 1 Visit OT Treatments $Self Care/Home Management : 8-22 mins   Janice Coffin, COTA/L 05/12/2019, 10:40 AM

## 2019-05-12 NOTE — Progress Notes (Signed)
Overall patient continues to do well.  Pain well controlled.  No new lower extremity symptoms.  Continue to have some left lower extremity sensory dysfunction and ataxia.  Mobilizing slowly.  She is afebrile.  Her vital signs are stable.  Her motor and sensory exam are stable.  Wound is clean and dry.  Status post thoracic laminectomy for tumor.  Patient still with some moderate spinal cord dysfunction.  Continue efforts at therapy.  Likely would benefit from inpatient rehab prior to discharge home.  No new recommendations.

## 2019-05-12 NOTE — Progress Notes (Signed)
PROGRESS NOTE    Rebecca Lucas  POE:[id number] DOB: [birth year] DOA: 05/07/2019 PCP: Caren Macadam, MD    Brief Narrative:  62 year old female who presented with urinary retention. 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 Patient status post palliative radiotherapy to the lumbar area and currently on chemotherapy. Patient reported 4 to 5 days of progressive difficulty urinating, 24 hours prior to hospitalization she developed suprapubic pain and urgency. Her symptoms were associated) to weakness and numbness, difficulty ambulation. On her initial physical examination blood pressure 103/57, heart rate 120, respiratory rate 17, temperature 99, oxygen saturation 100%. Moist mucous membranes, lungs clear to auscultation bilaterally, heart S1-S2 present rhythm, abdomen soft, no lower extremity edema.Left lower extremity strength 0 out of 5, proximal, distal 2-5. MRI of the lumbar spine with progressive bulky spinal paraspinal tumor, L1-L3. Progressive moderate to severe spinal canal stenosis and severe left lateral recess stenosis at L1-L2 and L2-L3.  Patient was admitted to the hospital with a working diagnosis of focal neurologic deficit duemalignancyrelated spinal cord compression.    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 Malignancy related cord compression. L1-L3. -Improved pain control with fentanyl patch, scheduled acetaminophen and ibuprofen. -will also continue as needed morphine and oxycodone.  -continue systemic steroids.  -Continue SCD's for dvt prophylaxis and physical therapy evaluation. -Follow with neurosurgery recommendations, when to resume enoxaparin.   2.  Urinary retention.Foley catheter removed on 8/29; but unfortunately failed to void and was replace. -will continue following and attempting voiding trial.  today, continue to monitor bladder scans.   3. 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  4. Reactive leukocytosis and mild hyperglycemia.  -Persistent leukocytosis at WBC 27 and CBG's 118-120 range -suspected steroid-induced. -Continue to follow trend. -No cough and on antibiotics.   5. 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  -Stable cell count with Hgb at 9,7  -No signs of apparent bleeding -Continue to follow hemoglobin trend.    DVT prophylaxis:scd's Code Status:full Family Communication:I spoke with patient'shusbandat the bedside and all questions were addressed to patient and husband satisfaction..    Body mass index is 22.2 kg/m.   Consultants:   Neurosurgery   Oncology   Procedures:   L2 Laminectomy   Antimicrobials:    none    Subjective: Afebrile, no chest pain, no shortness of breath, no nausea, no vomiting.  Reports no new symptoms in her lower extremity and express improvement in her back pain.  She continued to suppress sensory dysfunction no ataxia especially affecting her left lower extremity.  Objective: Vitals:   05/12/19 0111 05/12/19 0404 05/12/19 0801 05/12/19 1234  BP:  130/68 132/81 125/68  Pulse:  87 79 87  Resp:  17 17 16   Temp:  97.9 F (36.6 C) 98.5 F (36.9 C) 98.2 F (36.8 C)  TempSrc:  Oral Oral Oral  SpO2:  97% 99% 98%  Weight: 64.3 kg     Height:        Intake/Output Summary (Last 24 hours) at 05/12/2019 1533 Last data filed at 05/12/2019 0400  Gross per 24 hour  Intake 120 ml  Output -  Net 120 ml   Filed Weights   05/10/19 0415 05/11/19 0324 05/12/19 0111  Weight: 64.5  kg 64.7 kg 64.3 kg    Examination:  General exam: Alert, awake, oriented x 3, in no acute distress, denies significant pain.  No new changes on her lower extremity sensation.  No fever, no shortness of breath or chest pain. Respiratory system: Clear to auscultation. Respiratory effort normal. Cardiovascular system:RRR. No murmurs, rubs, gallops. Gastrointestinal system: Abdomen is nondistended, soft and nontender. No organomegaly or masses felt. Normal bowel sounds heard. Central nervous system: Alert and oriented. No new focal neurological deficits. Extremities: No cyanosis or clubbing; trace edema bilaterally.  SCDs in place. Skin: No rashes, lesions or ulcers Psychiatry: Judgement and insight appear normal. Mood & affect appropriate.    Data Reviewed: I have personally reviewed following labs and imaging studies  CBC: Recent Labs  Lab 05/07/19 1005 05/07/19 2219 05/08/19 1752 05/09/19 1350 05/09/19 1522 05/10/19 0424 05/11/19 0451 05/12/19 0545  WBC 14.7*  --  24.1*  --   --  26.0* 27.0* 28.3*  NEUTROABS 13.1*  --   --   --   --   --  25.0* 26.3*  HGB 8.8*  --  8.2* 6.8* 10.2* 11.3* 9.7* 9.5*  HCT 28.3*  --  25.4* 20.0* 30.0* 34.2* 29.3* 29.5*  MCV 101.4*  --  97.3  --   --  93.2 94.8 97.4  PLT PLATELET CLUMPS NOTED ON SMEAR, UNABLE TO ESTIMATE 68* 160  --   --  149* PLATELET CLUMPS NOTED ON SMEAR, UNABLE TO ESTIMATE 846*   Basic Metabolic Panel: Recent Labs  Lab 05/07/19 1005 05/09/19 1350 05/09/19 1522 05/10/19 0424 05/11/19 0451 05/12/19 0545  NA 136 140 138 140 139 137  K 4.0 4.3 4.8 4.9 4.8 4.8  CL 104  --   --  107 110 108  CO2 24  --   --  24 24 23   GLUCOSE 124*  --  133* 115* 119* 118*  BUN 21  --   --  21 18 17   CREATININE 0.73  --   --  0.74 0.57 0.54  CALCIUM 8.8*  --   --  7.6* 7.3* 7.3*   GFR: Estimated Creatinine Clearance: 71.8 mL/min (by C-G formula based on SCr of 0.54 mg/dL).   Liver Function Tests: Recent Labs  Lab 05/07/19 1005  AST  47*  ALT 79*  ALKPHOS 200*  BILITOT <0.1*  PROT 5.6*  ALBUMIN 2.5*   Coagulation Profile: Recent Labs  Lab 05/10/19 0424  INR 1.0   CBG: Recent Labs  Lab 05/08/19 1139 05/09/19 0821 05/10/19 0607 05/11/19 0742 05/12/19 0633  GLUCAP 109* 124* 107* 101* 118*    Scheduled Meds: . acetaminophen  650 mg Oral Q6H  . dexamethasone (DECADRON) injection  6 mg Intravenous Q6H  . fentaNYL  1 patch Transdermal Q72H  . gabapentin  300 mg Oral TID  . heparin lock flush  500 Units Intracatheter Q30 days  . ibuprofen  400 mg Oral TID  . linaclotide  72 mcg Oral QAC breakfast  . pantoprazole  40 mg Oral Daily  . polyethylene glycol  17 g Oral BID  . sodium chloride flush  10-40 mL Intracatheter Q12H  . sodium chloride flush  3 mL Intravenous Q12H  . sodium chloride flush  3 mL Intravenous Q12H   Continuous Infusions: . sodium chloride Stopped (05/10/19 1033)  .  sodium chloride    . lactated ringers 10 mL/hr at 05/09/19 0942     LOS: 5 days    Barton Dubois, MD [phone]

## 2019-05-13 ENCOUNTER — Encounter (HOSPITAL_COMMUNITY): Payer: Self-pay | Admitting: Neurosurgery

## 2019-05-13 LAB — GLUCOSE, CAPILLARY: Glucose-Capillary: 99 mg/dL (ref 70–99)

## 2019-05-13 NOTE — Progress Notes (Signed)
  NEUROSURGERY PROGRESS NOTE   No issues overnight.  Pain much improved even compared to preop No change in motor function Foley removed, voiding normal  EXAM:  BP 123/66 (BP Location: Right Arm)   Pulse 72   Temp 98.6 F (37 C) (Oral)   Resp 17   Ht 5\' 7"  (1.702 m)   Wt 65 kg   SpO2 99%   BMI 22.44 kg/m   Awake, alert, oriented  Speech fluent, appropriate  CN grossly intact  5/5 BUE/RLE, LLE 0-1/5 proximal, 4-/5 distal  IMPRESSION/PLAN 63 y.o. female s/p L2 laminectomy for decompression and T12-L2 stabilization for tumor resection. Retention resolved. Doing well. - Excellent CIR candidate - No new NS recs - Sutures will need to be removed in about 14 days. If patient is still in house, please call and I will assess readiness for removal. Otherwise, will plan to remove outpt in office  Please call for any concerns  Ferne Reus, [facility]

## 2019-05-13 NOTE — Progress Notes (Signed)
PROGRESS NOTE    Rebecca Lucas  XBL:[id number] DOB: [birth year] DOA: 05/07/2019 PCP: Caren Macadam, MD    Brief Narrative:  63 year old female who presented with urinary retention. 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 Patient status post palliative radiotherapy to the lumbar area and currently on chemotherapy. Patient reported 4 to 5 days of progressive difficulty urinating, 24 hours prior to hospitalization she developed suprapubic pain and urgency. Her symptoms were associated) to weakness and numbness, difficulty ambulation. On her initial physical examination blood pressure 103/57, heart rate 120, respiratory rate 17, temperature 99, oxygen saturation 100%. Moist mucous membranes, lungs clear to auscultation bilaterally, heart S1-S2 present rhythm, abdomen soft, no lower extremity edema.Left lower extremity strength 0 out of 5, proximal, distal 2-5. MRI of the lumbar spine with progressive bulky spinal paraspinal tumor, L1-L3. Progressive moderate to severe spinal canal stenosis and severe left lateral recess stenosis at L1-L2 and L2-L3.  Patient was admitted to the hospital with a working diagnosis of focal neurologic deficit duemalignancyrelated spinal cord compression.   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 Malignancy related moderate to severe spinal canal stenosis and severe left lateral recess stenosis at L1-L2 and L2-L3/ cauda equina syndrome.Pain with improved control with fentanyl patch, scheduled acetaminophen and ibuprofen, as neededoxycodone and IV morphine. Systemic steroids per surgery recommendations. Possible CIR in am. Continue pantoprazole and bowel  regimen.   2. Urinary retention.Tolerating well being off foley catheter   3. 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  4. Reactive leukocytosis. Reactive leukocytosis due to systemic steroids. Follow cell count as outpatient when steroids being tapered off.   DVT prophylaxis:scd Code Status:full Family Communication: no family at the bedside.     Body mass index is 22.44 kg/m. Malnutrition Type:      Malnutrition Characteristics:      Nutrition Interventions:     RN Pressure Injury Documentation:     Consultants:   Neurosurgery   Procedures:  1. L2 laminectomy, bilateral pediculotomy (biltaeral transpedicular approach) for decompression of thecal sac and nerve roots 2. Posterior segmental instrumentation, T12-L4 using alphatec screws 3. Posterolateral arthrodesis, L1-L3 4. Use of morcellized bone allograft  Antimicrobials:       Subjective: Patient is feeling better, she is out of bed to chair. Her back pain has improved and is controlled with analgesics, no recurrent urinary retention. Continue to be very weak and deconditioned.   Objective: Vitals:   05/13/19 0326 05/13/19 0328 05/13/19 0700 05/13/19 1100  BP: 123/66  122/77 130/79  Pulse: 72  66 84  Resp: 17  17 17   Temp: 98.6 F (37 C)  97.7 F (36.5 C) 98.1 F (36.7 C)  TempSrc: Oral  Axillary Axillary  SpO2: 99%  100% 99%  Weight:  65 kg    Height:        Intake/Output Summary (Last 24 hours) at 05/13/2019 1153 Last data filed at 05/13/2019 0600 Gross per 24 hour  Intake 120 ml  Output -  Net 120 ml   Filed Weights   05/11/19 0324 05/12/19 0111 05/13/19  0328  Weight: 64.7 kg 64.3 kg 65 kg    Examination:   General: Not in pain or dyspnea,. Neurology: Awake and alert, non focal  E ENT: no pallor, no icterus, oral mucosa  moist Cardiovascular: No JVD. S1-S2 present, rhythmic, no gallops, rubs, or murmurs. Trace lower extremity edema. Pulmonary: vesicular breath sounds bilaterally, adequate air movement, no wheezing, rhonchi or rales. Gastrointestinal. Abdomen with, no organomegaly, non tender, no rebound or guarding Skin. No rashes Musculoskeletal: no joint deformities     Data Reviewed: I have personally reviewed following labs and imaging studies  CBC: Recent Labs  Lab 05/07/19 1005 05/07/19 2219 05/08/19 1752 05/09/19 1350 05/09/19 1522 05/10/19 0424 05/11/19 0451 05/12/19 0545  WBC 14.7*  --  24.1*  --   --  26.0* 27.0* 28.3*  NEUTROABS 13.1*  --   --   --   --   --  25.0* 26.3*  HGB 8.8*  --  8.2* 6.8* 10.2* 11.3* 9.7* 9.5*  HCT 28.3*  --  25.4* 20.0* 30.0* 34.2* 29.3* 29.5*  MCV 101.4*  --  97.3  --   --  93.2 94.8 97.4  PLT PLATELET CLUMPS NOTED ON SMEAR, UNABLE TO ESTIMATE 68* 160  --   --  149* PLATELET CLUMPS NOTED ON SMEAR, UNABLE TO ESTIMATE 119*   Basic Metabolic Panel: Recent Labs  Lab 05/07/19 1005 05/09/19 1350 05/09/19 1522 05/10/19 0424 05/11/19 0451 05/12/19 0545  NA 136 140 138 140 139 137  K 4.0 4.3 4.8 4.9 4.8 4.8  CL 104  --   --  107 110 108  CO2 24  --   --  24 24 23   GLUCOSE 124*  --  133* 115* 119* 118*  BUN 21  --   --  21 18 17   CREATININE 0.73  --   --  0.74 0.57 0.54  CALCIUM 8.8*  --   --  7.6* 7.3* 7.3*   GFR: Estimated Creatinine Clearance: 71.8 mL/min (by C-G formula based on SCr of 0.54 mg/dL). Liver Function Tests: Recent Labs  Lab 05/07/19 1005  AST 47*  ALT 79*  ALKPHOS 200*  BILITOT <0.1*  PROT 5.6*  ALBUMIN 2.5*   No results for input(s): LIPASE, AMYLASE in the last 168 hours. No results for input(s): AMMONIA in the last 168 hours. Coagulation Profile: Recent Labs  Lab 05/10/19 0424  INR 1.0   Cardiac Enzymes: No results for input(s): CKTOTAL, CKMB, CKMBINDEX, TROPONINI in the last 168 hours. BNP (last 3 results) No  results for input(s): PROBNP in the last 8760 hours. HbA1C: No results for input(s): HGBA1C in the last 72 hours. CBG: Recent Labs  Lab 05/09/19 0821 05/10/19 0607 05/11/19 0742 05/12/19 0633 05/13/19 0619  GLUCAP 124* 107* 101* 118* 99   Lipid Profile: No results for input(s): CHOL, HDL, LDLCALC, TRIG, CHOLHDL, LDLDIRECT in the last 72 hours. Thyroid Function Tests: No results for input(s): TSH, T4TOTAL, FREET4, T3FREE, THYROIDAB in the last 72 hours. Anemia Panel: No results for input(s): VITAMINB12, FOLATE, FERRITIN, TIBC, IRON, RETICCTPCT in the last 72 hours.    Radiology Studies: I have reviewed all of the imaging during this hospital visit personally     Scheduled Meds: . acetaminophen  650 mg Oral Q6H  . dexamethasone (DECADRON) injection  6 mg Intravenous Q6H  . fentaNYL  1 patch Transdermal Q72H  . gabapentin  300 mg Oral TID  . heparin lock flush  500 Units Intracatheter Q30 days  . ibuprofen  400  mg Oral TID  . linaclotide  72 mcg Oral QAC breakfast  . pantoprazole  40 mg Oral Daily  . polyethylene glycol  17 g Oral BID  . sodium chloride flush  10-40 mL Intracatheter Q12H  . sodium chloride flush  3 mL Intravenous Q12H  . sodium chloride flush  3 mL Intravenous Q12H   Continuous Infusions: . sodium chloride Stopped (05/10/19 1033)  . sodium chloride    . lactated ringers 10 mL/hr at 05/09/19 3817     LOS: 6 days        Milik Gilreath Gerome Apley, MD

## 2019-05-13 NOTE — Progress Notes (Signed)
Physical Therapy Treatment Patient Details Name: Rebecca Lucas MRN: [id number] DOB: [birth year] Today's Date: 05/13/2019    History of Present Illness Patient is a 63 year old female with malignant neoplasm to epidural space. S/P lumbar fusion/descompression, posterior segmental instrumentation T12-L4 with screws, posterolateral arthrodesis L1-L3, allograft. Came to hospital due to urinary retention. Patient has h/o radiation and chemotherapy.    PT Comments    Patient seen for mobility progression. Pt is making progress toward PT goals and tolerated increased mobility. Pt with c/o 5/10 pain with mobility this session. L AFO donned for gait training. Pt requires min/mod A for all mobility including gait training distance of 10 ft. Continue to recommend CIR for further skilled PT services to maximize independence and safety with mobility.    Follow Up Recommendations  CIR     Equipment Recommendations  None recommended by PT    Recommendations for Other Services       Precautions / Restrictions Precautions Precautions: Fall;Back Precaution Booklet Issued: No Required Braces or Orthoses: Spinal Brace Spinal Brace: Lumbar corset;Applied in sitting position Restrictions Weight Bearing Restrictions: No    Mobility  Bed Mobility Overal bed mobility: Needs Assistance Bed Mobility: Sit to Sidelying         Sit to sidelying: Min assist General bed mobility comments: cues for sequencing; assist to bring L LE into bed; pt positioned in side lying   Transfers Overall transfer level: Needs assistance Equipment used: Rolling walker (2 wheeled) Transfers: Sit to/from Stand Sit to Stand: Mod assist         General transfer comment: cues for safe hand placement; pt stood from [facility] with assist to power up into standing   Ambulation/Gait Ambulation/Gait assistance: Min assist Gait Distance (Feet): 10 Feet Assistive device: Rolling walker (2 wheeled) Gait Pattern/deviations:  Step-to pattern;Decreased step length - left;Decreased dorsiflexion - left;Trunk flexed Gait velocity: decreased   General Gait Details: multimodal cues for L hip/knee flexion; pt with tendency to swing L LE forward from hip and unable to flex knee during swing phase; AFO donned    Stairs             Wheelchair Mobility    Modified Rankin (Stroke Patients Only)       Balance Overall balance assessment: Needs assistance Sitting-balance support: Single extremity supported;Feet supported Sitting balance-Leahy Scale: Fair     Standing balance support: Bilateral upper extremity supported;During functional activity Standing balance-Leahy Scale: Poor                              Cognition Arousal/Alertness: Awake/alert Behavior During Therapy: WFL for tasks assessed/performed Overall Cognitive Status: Within Functional Limits for tasks assessed                                        Exercises      General Comments        Pertinent Vitals/Pain Pain Assessment: 0-10 Pain Score: 5  Pain Location: back Pain Descriptors / Indicators: Guarding;Sore Pain Intervention(s): Limited activity within patient's tolerance;Monitored during session;Repositioned    Home Living                      Prior Function            PT Goals (current goals can now be found in the care plan section)  Acute Rehab PT Goals Patient Stated Goal: to decrease pain, get stronger Progress towards PT goals: Progressing toward goals    Frequency    Min 4X/week      PT Plan Current plan remains appropriate    Co-evaluation              AM-PAC PT "6 Clicks" Mobility   Outcome Measure  Help needed turning from your back to your side while in a flat bed without using bedrails?: A Little Help needed moving from lying on your back to sitting on the side of a flat bed without using bedrails?: A Lot Help needed moving to and from a bed to a chair  (including a wheelchair)?: A Lot Help needed standing up from a chair using your arms (e.g., wheelchair or bedside chair)?: A Little Help needed to walk in hospital room?: A Little Help needed climbing 3-5 steps with a railing? : A Lot 6 Click Score: 15    End of Session Equipment Utilized During Treatment: Gait belt;Back brace;Other (comment)(L AFO) Activity Tolerance: Patient tolerated treatment well Patient left: with call bell/phone within reach;in bed;with SCD's reapplied Nurse Communication: Mobility status PT Visit Diagnosis: Other abnormalities of gait and mobility (R26.89);Muscle weakness (generalized) (M62.81);Difficulty in walking, not elsewhere classified (R26.2);Pain;Unsteadiness on feet (R26.81) Pain - Right/Left: (back)     Time: 1410-1440 PT Time Calculation (min) (ACUTE ONLY): 30 min  Charges:  $Gait Training: 23-37 mins                     Earney Navy, PTA Acute Rehabilitation Services Pager: [phone] Office: [phone]     Darliss Cheney 05/13/2019, 3:12 PM

## 2019-05-13 NOTE — Progress Notes (Signed)
HEMATOLOGY-ONCOLOGY PROGRESS NOTE  SUBJECTIVE: Tolerated surgery well. Pain is improving daily. Working with PT and has been able to ambulate some. Thinks left foot numbness and swelling are improving. She has not other complaints today.  Oncology History  Metastatic malignant neoplasm ([place])  03/06/2018 Initial Diagnosis   Metastatic malignant neoplasm ([place])   04/11/2018 - 04/03/2019 Chemotherapy   The patient had palonosetron (ALOXI) injection 0.25 mg, 0.25 mg, Intravenous,  Once, 4 of 4 cycles Administration: 0.25 mg (04/11/2018), 0.25 mg (05/02/2018), 0.25 mg (05/22/2018), 0.25 mg (06/13/2018) PEMEtrexed (ALIMTA) 800 mg in sodium chloride 0.9 % 100 mL chemo infusion, 825 mg, Intravenous,  Once, 17 of 21 cycles Administration: 800 mg (04/11/2018), 800 mg (07/04/2018), 800 mg (05/02/2018), 800 mg (05/22/2018), 800 mg (07/24/2018), 800 mg (06/13/2018), 800 mg (08/16/2018), 800 mg (09/06/2018), 800 mg (09/27/2018), 800 mg (10/18/2018), 800 mg (11/08/2018), 800 mg (11/29/2018), 800 mg (12/20/2018), 800 mg (01/10/2019), 800 mg (01/31/2019), 800 mg (02/21/2019), 800 mg (03/14/2019) CARBOplatin (PARAPLATIN) 450 mg in sodium chloride 0.9 % 250 mL chemo infusion, 450 mg (100 % of original dose 450 mg), Intravenous,  Once, 4 of 4 cycles Dose modification: 450 mg (original dose 450 mg, Cycle 1), 462 mg (original dose 462 mg, Cycle 2), 462 mg (original dose 462 mg, Cycle 3), 462 mg (original dose 462 mg, Cycle 4) Administration: 450 mg (04/11/2018), 460 mg (05/02/2018), 460 mg (05/22/2018), 460 mg (06/13/2018) ondansetron (ZOFRAN) 8 mg, dexamethasone (DECADRON) 10 mg in sodium chloride 0.9 % 50 mL IVPB, , Intravenous,  Once, 1 of 1 cycle pembrolizumab (KEYTRUDA) 200 mg in sodium chloride 0.9 % 50 mL chemo infusion, 200 mg, Intravenous, Once, 17 of 21 cycles Administration: 200 mg (04/11/2018), 200 mg (07/04/2018), 200 mg (05/02/2018), 200 mg (05/22/2018), 200 mg (07/24/2018), 200 mg (06/13/2018), 200 mg (08/16/2018), 200 mg (09/06/2018),  200 mg (09/27/2018), 200 mg (10/18/2018), 200 mg (11/08/2018), 200 mg (11/29/2018), 200 mg (12/20/2018), 200 mg (01/10/2019), 200 mg (01/31/2019), 200 mg (02/21/2019), 200 mg (03/14/2019) fosaprepitant (EMEND) 150 mg, dexamethasone (DECADRON) 12 mg in sodium chloride 0.9 % 145 mL IVPB, , Intravenous,  Once, 4 of 4 cycles Administration:  (04/11/2018),  (05/02/2018),  (05/22/2018),  (06/13/2018)  for chemotherapy treatment.    04/25/2019 -  Chemotherapy   The patient had pegfilgrastim (NEULASTA ONPRO KIT) injection 6 mg, 6 mg, Subcutaneous, Once, 1 of 6 cycles DOCEtaxel (TAXOTERE) 120 mg in sodium chloride 0.9 % 250 mL chemo infusion, 75 mg/m2 = 120 mg, Intravenous,  Once, 1 of 6 cycles Administration: 120 mg (04/25/2019) ramucirumab (CYRAMZA) 500 mg in sodium chloride 0.9 % 200 mL chemo infusion, 10 mg/kg = 500 mg, Intravenous, Once, 1 of 6 cycles Administration: 500 mg (04/25/2019)  for chemotherapy treatment.    Primary cancer of right upper lobe of lung ([place])  03/06/2018 Initial Diagnosis   Primary cancer of right upper lobe of lung ([place])   04/25/2019 -  Chemotherapy   The patient had pegfilgrastim (NEULASTA ONPRO KIT) injection 6 mg, 6 mg, Subcutaneous, Once, 1 of 6 cycles DOCEtaxel (TAXOTERE) 120 mg in sodium chloride 0.9 % 250 mL chemo infusion, 75 mg/m2 = 120 mg, Intravenous,  Once, 1 of 6 cycles Administration: 120 mg (04/25/2019) ramucirumab (CYRAMZA) 500 mg in sodium chloride 0.9 % 200 mL chemo infusion, 10 mg/kg = 500 mg, Intravenous, Once, 1 of 6 cycles Administration: 500 mg (04/25/2019)  for chemotherapy treatment.       REVIEW OF SYSTEMS:   As noted in the HPI.  I have reviewed the  past medical history, past surgical history, social history and family history with the patient and they are unchanged from previous note.   PHYSICAL EXAMINATION: ECOG PERFORMANCE STATUS: 2 - Symptomatic, <50% confined to bed  Vitals:   05/13/19 0700 05/13/19 1100  BP: 122/77 130/79  Pulse: 66 84  Resp: 17  17  Temp: 97.7 F (36.5 C) 98.1 F (36.7 C)  SpO2: 100% 99%   Filed Weights   05/11/19 0324 05/12/19 0111 05/13/19 0328  Weight: 142 lb 10.2 oz (64.7 kg) 141 lb 12.1 oz (64.3 kg) 143 lb 4.8 oz (65 kg)    Intake/Output from previous day: 08/30 0701 - 08/31 0700 In: 120 [P.O.:120] Out: -   GENERAL:alert, no distress and comfortable SKIN: skin color, texture, turgor are normal, no rashes or significant lesions EYES: normal, Conjunctiva are pink and non-injected, sclera clear OROPHARYNX:no exudate, no erythema and lips, buccal mucosa, and tongue normal  NECK: supple, thyroid normal size, non-tender, without nodularity LYMPH:  no palpable lymphadenopathy in the cervical, axillary or inguinal LUNGS: clear to auscultation and percussion with normal breathing effort HEART: regular rate & rhythm and no murmurs.  Lower extremity edema, left greater than right; improved. ABDOMEN:abdomen soft, non-tender and normal bowel sounds Musculoskeletal:no cyanosis of digits and no clubbing  NEURO: alert & oriented x 3.  Diminished sensation in her left foot and leg.  LABORATORY DATA:  I have reviewed the data as listed CMP Latest Ref Rng & Units 05/12/2019 05/11/2019 05/10/2019  Glucose 70 - 99 mg/dL 118(H) 119(H) 115(H)  BUN 8 - 23 mg/dL _0 Creatinine 0.44 - 1.00 mg/dL 0.54 0.57 0.74  Sodium 135 - 145 mmol/L 137 139 140  Potassium 3.5 - 5.1 mmol/L 4.8 4.8 4.9  Chloride 98 - 111 mmol/L 108 110 107  CO2 22 - 32 mmol/L _1 Calcium 8.9 - 10.3 mg/dL 7.3(L) 7.3(L) 7.6(L)  Total Protein 6.5 - 8.1 g/dL - - -  Total Bilirubin 0.3 - 1.2 mg/dL - - -  Alkaline Phos 38 - 126 U/L - - -  AST 15 - 41 U/L - - -  ALT 0 - 44 U/L - - -    Lab Results  Component Value Date   WBC 28.3 (H) 05/12/2019   HGB 9.5 (L) 05/12/2019   HCT 29.5 (L) 05/12/2019   MCV 97.4 05/12/2019   PLT 129 (L) 05/12/2019   NEUTROABS 26.3 (H) 05/12/2019    Dg Thoracolumabar Spine  Result Date: 05/09/2019 CLINICAL  DATA:  Posterior fusion EXAM: DG C-ARM 1-60 MIN; THORACOLUMBAR SPINE - 2 VIEW COMPARISON:  CT 08/08/2019 FINDINGS: Short intraoperative spot images demonstrate fusion across the L2 compression fracture from T12 to L4. No hardware complicating feature. IMPRESSION: Posterior fusion as above.  No visible complicating feature. Electronically Signed   By: Rolm Baptise M.D.   On: 05/09/2019 15:57   Ct Lumbar Spine Wo Contrast  Result Date: 05/08/2019 CLINICAL DATA:  Metastatic cancer lumbar spine. Preop evaluation. Lung cancer. EXAM: CT LUMBAR SPINE WITHOUT CONTRAST TECHNIQUE: Multidetector CT imaging of the lumbar spine was performed without intravenous contrast administration. Multiplanar CT image reconstructions were also generated. COMPARISON:  Lumbar MRI 02/04/2019 FINDINGS: Segmentation: Normal Alignment: Mild anterolisthesis L1-2 related to fracture. Otherwise normal alignment. Vertebrae: Metastatic disease L1, L2, and L3 vertebral bodies as noted on MRI. Severe pathologic fracture of L2. Tumor is prominently sclerotic in appearance by CT. There is destructive change in the anterior superior L3 vertebral body. Pathologic fracture of  the pedicle of L2 bilaterally. Nonpathologic appearing fracture of the right L2 transverse process. Stable metastatic disease in the right sacral ala. Paraspinal and other soft tissues: Extensive paraspinous soft tissue thickening around the pathologic fracture of L1 compatible with hemorrhage and tumor extending into the soft tissues and retroperitoneum. No enlarged lymph nodes in the retroperitoneum. Disc levels: T12-L1: Negative L1-2: Epidural tumor in the spinal canal left greater than right is better seen by MRI than CT. There is tumor in the left foramen with left foraminal encroachment as well as moderate spinal stenosis as noted on MRI. L2-3: Epidural tumor extending to the left is best seen on MRI and is extending the foramen causing left foraminal encroachment. Moderate  spinal stenosis. L3-4: Mild disc bulging without stenosis L4-5: Negative L5-S1: Negative IMPRESSION: Metastatic disease L1, L2, and L3 vertebral bodies with pathologic fracture of L2. Epidural tumor in the canal central and left-sided at L1-2 and L2-3 causing spinal stenosis best visualized on MRI. Pathologic fracture L2 pedicle bilaterally. Nonpathologic fracture right L2 transverse process Metastatic disease right sacral ala stable. Electronically Signed   By: Franchot Gallo M.D.   On: 05/08/2019 13:18   Mr Cervical Spine W Or Wo Contrast  Result Date: 05/07/2019 CLINICAL DATA:  Urinary retention since 5 a.m. History of metastatic poorly differentiated neoplasm, possibly lung cancer. Currently on chemotherapy. EXAM: MRI CERVICAL, THORACIC AND LUMBAR SPINE WITHOUT AND WITH CONTRAST TECHNIQUE: Multiplanar and multiecho pulse sequences of the cervical spine, to include the craniocervical junction and cervicothoracic junction, and thoracic and lumbar spine, were obtained without and with intravenous contrast. CONTRAST:  5 mL Gadavist intravenous contrast. COMPARISON:  MRI lumbar spine dated March 21, 2019. CT chest, abdomen, and pelvis dated February 19, 2019. CT neck dated October 16, 2018. MRI cervical spine dated February 24, 2018. FINDINGS: MRI CERVICAL SPINE FINDINGS Alignment: New 2 mm anterolisthesis at C2-C3. Vertebrae: Enhancing expansile lesion in the C2 and C3 posterior elements is similar to CT neck from February, and significantly decreased in size since cervical spine MRI and June 2019. No new suspicious bone lesion. No fracture or evidence of discitis. Cord: Normal signal and morphology.  No intradural enhancement. Posterior Fossa, vertebral arteries, paraspinal tissues: Negative. Disc levels: C2-C3: Negative disc. Mild bilateral facet arthropathy. No stenosis. C3-C4: Unchanged small left paracentral disc protrusion. No stenosis. C4-C5: Unchanged mild disc bulging eccentric to the left and mild bilateral facet  uncovertebral hypertrophy. No stenosis. C5-C6: Unchanged mild disc bulging and bilateral facet uncovertebral hypertrophy. Unchanged mild left neuroforaminal stenosis. No spinal canal or right neuroforaminal stenosis. C6-C7:  Unchanged small central disc protrusion.  No stenosis. C7-T1:  Negative. MRI THORACIC SPINE FINDINGS Alignment:  Physiologic. Vertebrae: No fracture, evidence of discitis, or bone lesion. Cord:  Normal signal and morphology.  No intradural enhancement. Paraspinal and other soft tissues: Stable radiation changes in the posterior right upper lobe. Slightly increased small right pleural effusion. New trace left pleural effusion. Disc levels: Small central disc protrusions and minimal disc bulging from T7-T8 through T11-T12. No spinal canal or neuroforaminal stenosis. MRI LUMBAR SPINE FINDINGS Segmentation:  Standard. Alignment:  Unchanged mild retrolisthesis of L2. Vertebrae: Again seen is tumor involving L1, L2, and L3 vertebral bodies. Tumor involvement of the L3 vertebral body appears increased. Unchanged severe pathologic L2 compression fracture. Unchanged tumor involving the L2 posterior elements. Anterior extraosseous extension of tumor is grossly unchanged. Conus medullaris and cauda equina: Conus extends to the L1 level. Conus and cauda equina appear normal. No intradural enhancement.  Paraspinal and other soft tissues: Right sacral ala and left posterior iliac metastases are stable to slightly decreased in size. Disc levels: T12-L1: Unchanged small left paracentral disc protrusion. No stenosis. L1-L2 and L2-L3: Circumferential epidural tumor, worse on the left, has progressed since the prior study, with worsening moderate to severe spinal canal stenosis and severe left lateral recess stenosis at both levels. Unchanged complete tumor infiltration of the left L1-L2 and L2-L3 neural foramina and moderate infiltration of the right L2-L3 neural foramen. L3-L4:  Negative. L4-L5:  Negative. L5-S1:   Negative. IMPRESSION: Cervical spine: 1. Grossly unchanged osseous metastatic disease involving the C2 and C3 posterior elements when compared to neck CT from February 2020. Lesions have significantly decreased in size since MRI cervical spine from June 2019. 2. No progressive metastatic disease. 3. Unchanged mild multilevel cervical spondylosis as described above. No high-grade stenosis. Thoracic spine: 1. No evidence of osseous metastatic disease. 2. Stable radiation changes in the posterior right upper lobe. 3. Slightly increased small right pleural effusion. New trace left pleural effusion. Lumbar spine: 1. Progressive bulky spinal and paraspinal tumor from L1 through L3, with greater involvement of the L3 vertebral body and increased left-sided epidural tumor resulting in progressive moderate to severe spinal canal stenosis and severe left lateral recess stenosis at L1-L2 and L2-L3. 2. Grossly unchanged involvement of the left L1-L2 and bilateral L2-L3 neural foramina. 3. Stable pathologic compression fracture of L2 with mild retropulsion. 4. Right sacral ala and left posterior iliac metastases are stable to slightly decreased in size. Electronically Signed   By: Titus Dubin M.D.   On: 05/07/2019 16:58   Mr Thoracic Spine W Wo Contrast  Result Date: 05/07/2019 CLINICAL DATA:  Urinary retention since 5 a.m. History of metastatic poorly differentiated neoplasm, possibly lung cancer. Currently on chemotherapy. EXAM: MRI CERVICAL, THORACIC AND LUMBAR SPINE WITHOUT AND WITH CONTRAST TECHNIQUE: Multiplanar and multiecho pulse sequences of the cervical spine, to include the craniocervical junction and cervicothoracic junction, and thoracic and lumbar spine, were obtained without and with intravenous contrast. CONTRAST:  5 mL Gadavist intravenous contrast. COMPARISON:  MRI lumbar spine dated March 21, 2019. CT chest, abdomen, and pelvis dated February 19, 2019. CT neck dated October 16, 2018. MRI cervical spine dated  February 24, 2018. FINDINGS: MRI CERVICAL SPINE FINDINGS Alignment: New 2 mm anterolisthesis at C2-C3. Vertebrae: Enhancing expansile lesion in the C2 and C3 posterior elements is similar to CT neck from February, and significantly decreased in size since cervical spine MRI and June 2019. No new suspicious bone lesion. No fracture or evidence of discitis. Cord: Normal signal and morphology.  No intradural enhancement. Posterior Fossa, vertebral arteries, paraspinal tissues: Negative. Disc levels: C2-C3: Negative disc. Mild bilateral facet arthropathy. No stenosis. C3-C4: Unchanged small left paracentral disc protrusion. No stenosis. C4-C5: Unchanged mild disc bulging eccentric to the left and mild bilateral facet uncovertebral hypertrophy. No stenosis. C5-C6: Unchanged mild disc bulging and bilateral facet uncovertebral hypertrophy. Unchanged mild left neuroforaminal stenosis. No spinal canal or right neuroforaminal stenosis. C6-C7:  Unchanged small central disc protrusion.  No stenosis. C7-T1:  Negative. MRI THORACIC SPINE FINDINGS Alignment:  Physiologic. Vertebrae: No fracture, evidence of discitis, or bone lesion. Cord:  Normal signal and morphology.  No intradural enhancement. Paraspinal and other soft tissues: Stable radiation changes in the posterior right upper lobe. Slightly increased small right pleural effusion. New trace left pleural effusion. Disc levels: Small central disc protrusions and minimal disc bulging from T7-T8 through T11-T12. No spinal canal or  neuroforaminal stenosis. MRI LUMBAR SPINE FINDINGS Segmentation:  Standard. Alignment:  Unchanged mild retrolisthesis of L2. Vertebrae: Again seen is tumor involving L1, L2, and L3 vertebral bodies. Tumor involvement of the L3 vertebral body appears increased. Unchanged severe pathologic L2 compression fracture. Unchanged tumor involving the L2 posterior elements. Anterior extraosseous extension of tumor is grossly unchanged. Conus medullaris and cauda  equina: Conus extends to the L1 level. Conus and cauda equina appear normal. No intradural enhancement. Paraspinal and other soft tissues: Right sacral ala and left posterior iliac metastases are stable to slightly decreased in size. Disc levels: T12-L1: Unchanged small left paracentral disc protrusion. No stenosis. L1-L2 and L2-L3: Circumferential epidural tumor, worse on the left, has progressed since the prior study, with worsening moderate to severe spinal canal stenosis and severe left lateral recess stenosis at both levels. Unchanged complete tumor infiltration of the left L1-L2 and L2-L3 neural foramina and moderate infiltration of the right L2-L3 neural foramen. L3-L4:  Negative. L4-L5:  Negative. L5-S1:  Negative. IMPRESSION: Cervical spine: 1. Grossly unchanged osseous metastatic disease involving the C2 and C3 posterior elements when compared to neck CT from February 2020. Lesions have significantly decreased in size since MRI cervical spine from June 2019. 2. No progressive metastatic disease. 3. Unchanged mild multilevel cervical spondylosis as described above. No high-grade stenosis. Thoracic spine: 1. No evidence of osseous metastatic disease. 2. Stable radiation changes in the posterior right upper lobe. 3. Slightly increased small right pleural effusion. New trace left pleural effusion. Lumbar spine: 1. Progressive bulky spinal and paraspinal tumor from L1 through L3, with greater involvement of the L3 vertebral body and increased left-sided epidural tumor resulting in progressive moderate to severe spinal canal stenosis and severe left lateral recess stenosis at L1-L2 and L2-L3. 2. Grossly unchanged involvement of the left L1-L2 and bilateral L2-L3 neural foramina. 3. Stable pathologic compression fracture of L2 with mild retropulsion. 4. Right sacral ala and left posterior iliac metastases are stable to slightly decreased in size. Electronically Signed   By: Titus Dubin M.D.   On: 05/07/2019  16:58   Mr Lumbar Spine W Wo Contrast  Result Date: 05/07/2019 CLINICAL DATA:  Urinary retention since 5 a.m. History of metastatic poorly differentiated neoplasm, possibly lung cancer. Currently on chemotherapy. EXAM: MRI CERVICAL, THORACIC AND LUMBAR SPINE WITHOUT AND WITH CONTRAST TECHNIQUE: Multiplanar and multiecho pulse sequences of the cervical spine, to include the craniocervical junction and cervicothoracic junction, and thoracic and lumbar spine, were obtained without and with intravenous contrast. CONTRAST:  5 mL Gadavist intravenous contrast. COMPARISON:  MRI lumbar spine dated March 21, 2019. CT chest, abdomen, and pelvis dated February 19, 2019. CT neck dated October 16, 2018. MRI cervical spine dated February 24, 2018. FINDINGS: MRI CERVICAL SPINE FINDINGS Alignment: New 2 mm anterolisthesis at C2-C3. Vertebrae: Enhancing expansile lesion in the C2 and C3 posterior elements is similar to CT neck from February, and significantly decreased in size since cervical spine MRI and June 2019. No new suspicious bone lesion. No fracture or evidence of discitis. Cord: Normal signal and morphology.  No intradural enhancement. Posterior Fossa, vertebral arteries, paraspinal tissues: Negative. Disc levels: C2-C3: Negative disc. Mild bilateral facet arthropathy. No stenosis. C3-C4: Unchanged small left paracentral disc protrusion. No stenosis. C4-C5: Unchanged mild disc bulging eccentric to the left and mild bilateral facet uncovertebral hypertrophy. No stenosis. C5-C6: Unchanged mild disc bulging and bilateral facet uncovertebral hypertrophy. Unchanged mild left neuroforaminal stenosis. No spinal canal or right neuroforaminal stenosis. C6-C7:  Unchanged small  central disc protrusion.  No stenosis. C7-T1:  Negative. MRI THORACIC SPINE FINDINGS Alignment:  Physiologic. Vertebrae: No fracture, evidence of discitis, or bone lesion. Cord:  Normal signal and morphology.  No intradural enhancement. Paraspinal and other soft  tissues: Stable radiation changes in the posterior right upper lobe. Slightly increased small right pleural effusion. New trace left pleural effusion. Disc levels: Small central disc protrusions and minimal disc bulging from T7-T8 through T11-T12. No spinal canal or neuroforaminal stenosis. MRI LUMBAR SPINE FINDINGS Segmentation:  Standard. Alignment:  Unchanged mild retrolisthesis of L2. Vertebrae: Again seen is tumor involving L1, L2, and L3 vertebral bodies. Tumor involvement of the L3 vertebral body appears increased. Unchanged severe pathologic L2 compression fracture. Unchanged tumor involving the L2 posterior elements. Anterior extraosseous extension of tumor is grossly unchanged. Conus medullaris and cauda equina: Conus extends to the L1 level. Conus and cauda equina appear normal. No intradural enhancement. Paraspinal and other soft tissues: Right sacral ala and left posterior iliac metastases are stable to slightly decreased in size. Disc levels: T12-L1: Unchanged small left paracentral disc protrusion. No stenosis. L1-L2 and L2-L3: Circumferential epidural tumor, worse on the left, has progressed since the prior study, with worsening moderate to severe spinal canal stenosis and severe left lateral recess stenosis at both levels. Unchanged complete tumor infiltration of the left L1-L2 and L2-L3 neural foramina and moderate infiltration of the right L2-L3 neural foramen. L3-L4:  Negative. L4-L5:  Negative. L5-S1:  Negative. IMPRESSION: Cervical spine: 1. Grossly unchanged osseous metastatic disease involving the C2 and C3 posterior elements when compared to neck CT from February 2020. Lesions have significantly decreased in size since MRI cervical spine from June 2019. 2. No progressive metastatic disease. 3. Unchanged mild multilevel cervical spondylosis as described above. No high-grade stenosis. Thoracic spine: 1. No evidence of osseous metastatic disease. 2. Stable radiation changes in the posterior  right upper lobe. 3. Slightly increased small right pleural effusion. New trace left pleural effusion. Lumbar spine: 1. Progressive bulky spinal and paraspinal tumor from L1 through L3, with greater involvement of the L3 vertebral body and increased left-sided epidural tumor resulting in progressive moderate to severe spinal canal stenosis and severe left lateral recess stenosis at L1-L2 and L2-L3. 2. Grossly unchanged involvement of the left L1-L2 and bilateral L2-L3 neural foramina. 3. Stable pathologic compression fracture of L2 with mild retropulsion. 4. Right sacral ala and left posterior iliac metastases are stable to slightly decreased in size. Electronically Signed   By: Titus Dubin M.D.   On: 05/07/2019 16:58   Dg C-arm 1-60 Min  Result Date: 05/09/2019 CLINICAL DATA:  Posterior fusion EXAM: DG C-ARM 1-60 MIN; THORACOLUMBAR SPINE - 2 VIEW COMPARISON:  CT 08/08/2019 FINDINGS: Short intraoperative spot images demonstrate fusion across the L2 compression fracture from T12 to L4. No hardware complicating feature. IMPRESSION: Posterior fusion as above.  No visible complicating feature. Electronically Signed   By: Rolm Baptise M.D.   On: 05/09/2019 15:57   PATHOLOGY:  Diagnosis Bone, biopsy, Lumbar Two - FRAGMENTS OF REACTIVE BONE WITH MINIMAL BONE MARROW ELEMENTS - DENSE FIBROUS TISSUE - NO EXTRINSIC CELL POPULATION IDENTIFIED. SEE NOTE Diagnosis Note Dr. Melina Copa has reviewed the case and concurs with the above diagnosis. 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 PDL 1 expression  was 99%. The molecular studies by [facility] as well as [employer] showed no actionable mutations. The patient was a started on treatment with systemic chemotherapy with carboplatin for AUC of 5, Alimta 500 mg/M2 and Keytruda 200 mg IV every 3 weeks. She is status post 4 cycles with partial response.  The patient was started on maintenance treatment with Alimta and Keytruda status post 13 cycles.This was discontinued to to evidence of disease progression with disease progression with epidural extension of her tumor in the lumbar spine. She subsequently received palliative radiotherapy to this area under the care of Dr. Tammi Klippel. Her last radiation treatment was on 04/18/2019.  The patient recently started on docetaxel 75 mg meter squared and Cyramza 10 mg/kg every 3 weeks.  Her first cycle was given on 04/25/2019.  She tolerated her first cycle very well.    Unfortunately, she has now developed urinary retention and MRI shows progressive bulky tumor from L1-L3.  Status post L2 laminectomy, bilateral pediculotomy for decompression of thecal sac and nerve roots.  She no longer has urinary retention and is able ambulate short distances.  The numbness in her lower extremity has improved.  She is being considered for inpatient rehabilitation.  She is currently scheduled for chemotherapy later this week on 05/16/2019.  Will discuss with Dr. Julien Nordmann delaying her chemotherapy until after she fully recovers and completes inpatient rehabilitation.   LOS: 6 days   Mikey Bussing, DNP, AGPCNP-BC, AOCNP 05/13/19

## 2019-05-13 NOTE — Progress Notes (Signed)
Inpatient Rehabilitation Admissions Coordinator  Inpatient rehab consult received. I met with patient at bedside for rehab assessment. We discussed goals and expectations of an inpt rehab admit. She is in agreement. I will begin insurance approval with [employer] for a possible admit Tuesday.  Danne Baxter, RN, MSN Rehab Admissions Coordinator [phone] 05/13/2019 12:21 PM

## 2019-05-14 ENCOUNTER — Inpatient Hospital Stay (HOSPITAL_COMMUNITY): Payer: 59

## 2019-05-14 ENCOUNTER — Encounter (HOSPITAL_COMMUNITY): Payer: Self-pay

## 2019-05-14 ENCOUNTER — Other Ambulatory Visit: Payer: Self-pay

## 2019-05-14 ENCOUNTER — Inpatient Hospital Stay (HOSPITAL_COMMUNITY)
Admission: RE | Admit: 2019-05-14 | Discharge: 2019-06-07 | DRG: 560 | Disposition: A | Discharge status: Home Health | Payer: 59 | Source: Intra-hospital | Attending: Physical Medicine and Rehabilitation | Admitting: Physical Medicine and Rehabilitation

## 2019-05-14 DIAGNOSIS — K5903 Drug induced constipation: Secondary | ICD-10-CM | POA: Diagnosis not present

## 2019-05-14 DIAGNOSIS — G8918 Other acute postprocedural pain: Secondary | ICD-10-CM | POA: Diagnosis present

## 2019-05-14 DIAGNOSIS — R000 Tachycardia, unspecified: Secondary | ICD-10-CM | POA: Diagnosis not present

## 2019-05-14 DIAGNOSIS — K592 Neurogenic bowel, not elsewhere classified: Secondary | ICD-10-CM | POA: Diagnosis not present

## 2019-05-14 DIAGNOSIS — N390 Urinary tract infection, site not specified: Secondary | ICD-10-CM | POA: Diagnosis not present

## 2019-05-14 DIAGNOSIS — R609 Edema, unspecified: Secondary | ICD-10-CM

## 2019-05-14 DIAGNOSIS — K5900 Constipation, unspecified: Secondary | ICD-10-CM | POA: Diagnosis not present

## 2019-05-14 DIAGNOSIS — R739 Hyperglycemia, unspecified: Secondary | ICD-10-CM | POA: Diagnosis present

## 2019-05-14 DIAGNOSIS — R339 Retention of urine, unspecified: Secondary | ICD-10-CM | POA: Diagnosis present

## 2019-05-14 DIAGNOSIS — E876 Hypokalemia: Secondary | ICD-10-CM | POA: Diagnosis not present

## 2019-05-14 DIAGNOSIS — B9620 Unspecified Escherichia coli [E. coli] as the cause of diseases classified elsewhere: Secondary | ICD-10-CM | POA: Diagnosis not present

## 2019-05-14 DIAGNOSIS — Z923 Personal history of irradiation: Secondary | ICD-10-CM | POA: Diagnosis not present

## 2019-05-14 DIAGNOSIS — T380X5A Adverse effect of glucocorticoids and synthetic analogues, initial encounter: Secondary | ICD-10-CM | POA: Diagnosis present

## 2019-05-14 DIAGNOSIS — M7061 Trochanteric bursitis, right hip: Secondary | ICD-10-CM | POA: Diagnosis present

## 2019-05-14 DIAGNOSIS — M8458XA Pathological fracture in neoplastic disease, other specified site, initial encounter for fracture: Secondary | ICD-10-CM | POA: Diagnosis present

## 2019-05-14 DIAGNOSIS — D72825 Bandemia: Secondary | ICD-10-CM | POA: Diagnosis present

## 2019-05-14 DIAGNOSIS — R7401 Elevation of levels of liver transaminase levels: Secondary | ICD-10-CM | POA: Diagnosis present

## 2019-05-14 DIAGNOSIS — Z20828 Contact with and (suspected) exposure to other viral communicable diseases: Secondary | ICD-10-CM | POA: Diagnosis present

## 2019-05-14 DIAGNOSIS — R140 Abdominal distension (gaseous): Secondary | ICD-10-CM | POA: Diagnosis not present

## 2019-05-14 DIAGNOSIS — N319 Neuromuscular dysfunction of bladder, unspecified: Secondary | ICD-10-CM | POA: Diagnosis not present

## 2019-05-14 DIAGNOSIS — R238 Other skin changes: Secondary | ICD-10-CM | POA: Diagnosis not present

## 2019-05-14 DIAGNOSIS — G893 Neoplasm related pain (acute) (chronic): Secondary | ICD-10-CM | POA: Diagnosis present

## 2019-05-14 DIAGNOSIS — K3189 Other diseases of stomach and duodenum: Secondary | ICD-10-CM | POA: Diagnosis not present

## 2019-05-14 DIAGNOSIS — D539 Nutritional anemia, unspecified: Secondary | ICD-10-CM | POA: Diagnosis not present

## 2019-05-14 DIAGNOSIS — K828 Other specified diseases of gallbladder: Secondary | ICD-10-CM | POA: Diagnosis not present

## 2019-05-14 DIAGNOSIS — R1013 Epigastric pain: Secondary | ICD-10-CM | POA: Diagnosis not present

## 2019-05-14 DIAGNOSIS — D72828 Other elevated white blood cell count: Secondary | ICD-10-CM | POA: Diagnosis not present

## 2019-05-14 DIAGNOSIS — C7951 Secondary malignant neoplasm of bone: Secondary | ICD-10-CM | POA: Diagnosis present

## 2019-05-14 DIAGNOSIS — D72829 Elevated white blood cell count, unspecified: Secondary | ICD-10-CM | POA: Diagnosis not present

## 2019-05-14 DIAGNOSIS — T402X5A Adverse effect of other opioids, initial encounter: Secondary | ICD-10-CM | POA: Diagnosis not present

## 2019-05-14 DIAGNOSIS — B370 Candidal stomatitis: Secondary | ICD-10-CM | POA: Diagnosis not present

## 2019-05-14 DIAGNOSIS — D696 Thrombocytopenia, unspecified: Secondary | ICD-10-CM | POA: Diagnosis present

## 2019-05-14 DIAGNOSIS — Z9221 Personal history of antineoplastic chemotherapy: Secondary | ICD-10-CM

## 2019-05-14 DIAGNOSIS — K567 Ileus, unspecified: Secondary | ICD-10-CM | POA: Diagnosis not present

## 2019-05-14 DIAGNOSIS — K219 Gastro-esophageal reflux disease without esophagitis: Secondary | ICD-10-CM | POA: Diagnosis present

## 2019-05-14 DIAGNOSIS — K229 Disease of esophagus, unspecified: Secondary | ICD-10-CM | POA: Diagnosis not present

## 2019-05-14 DIAGNOSIS — C7949 Secondary malignant neoplasm of other parts of nervous system: Secondary | ICD-10-CM | POA: Diagnosis not present

## 2019-05-14 DIAGNOSIS — Z79899 Other long term (current) drug therapy: Secondary | ICD-10-CM

## 2019-05-14 DIAGNOSIS — C3411 Malignant neoplasm of upper lobe, right bronchus or lung: Secondary | ICD-10-CM

## 2019-05-14 DIAGNOSIS — C7801 Secondary malignant neoplasm of right lung: Secondary | ICD-10-CM | POA: Diagnosis present

## 2019-05-14 DIAGNOSIS — R109 Unspecified abdominal pain: Secondary | ICD-10-CM | POA: Diagnosis not present

## 2019-05-14 DIAGNOSIS — R740 Nonspecific elevation of levels of transaminase and lactic acid dehydrogenase [LDH]: Secondary | ICD-10-CM | POA: Diagnosis not present

## 2019-05-14 DIAGNOSIS — G834 Cauda equina syndrome: Secondary | ICD-10-CM | POA: Diagnosis not present

## 2019-05-14 DIAGNOSIS — M21372 Foot drop, left foot: Secondary | ICD-10-CM | POA: Diagnosis present

## 2019-05-14 DIAGNOSIS — R188 Other ascites: Secondary | ICD-10-CM | POA: Diagnosis not present

## 2019-05-14 DIAGNOSIS — C801 Malignant (primary) neoplasm, unspecified: Secondary | ICD-10-CM | POA: Diagnosis present

## 2019-05-14 DIAGNOSIS — K259 Gastric ulcer, unspecified as acute or chronic, without hemorrhage or perforation: Secondary | ICD-10-CM | POA: Diagnosis not present

## 2019-05-14 DIAGNOSIS — D62 Acute posthemorrhagic anemia: Secondary | ICD-10-CM | POA: Diagnosis present

## 2019-05-14 DIAGNOSIS — E871 Hypo-osmolality and hyponatremia: Secondary | ICD-10-CM | POA: Diagnosis not present

## 2019-05-14 DIAGNOSIS — Z23 Encounter for immunization: Secondary | ICD-10-CM

## 2019-05-14 DIAGNOSIS — Z4789 Encounter for other orthopedic aftercare: Secondary | ICD-10-CM | POA: Diagnosis present

## 2019-05-14 DIAGNOSIS — R338 Other retention of urine: Secondary | ICD-10-CM | POA: Diagnosis not present

## 2019-05-14 DIAGNOSIS — C412 Malignant neoplasm of vertebral column: Secondary | ICD-10-CM | POA: Diagnosis not present

## 2019-05-14 DIAGNOSIS — R799 Abnormal finding of blood chemistry, unspecified: Secondary | ICD-10-CM | POA: Diagnosis not present

## 2019-05-14 DIAGNOSIS — R1084 Generalized abdominal pain: Secondary | ICD-10-CM | POA: Diagnosis not present

## 2019-05-14 DIAGNOSIS — G9581 Conus medullaris syndrome: Secondary | ICD-10-CM

## 2019-05-14 HISTORY — DX: Acute posthemorrhagic anemia: D62

## 2019-05-14 MED ORDER — ACETAMINOPHEN 325 MG PO TABS
650.0000 mg | ORAL_TABLET | Freq: Four times a day (QID) | ORAL | Status: DC
  Administered 2019-05-15 – 2019-06-07 (×65): 650 mg via ORAL
  Filled 2019-05-14 (×83): qty 2

## 2019-05-14 MED ORDER — DEXAMETHASONE 4 MG PO TABS
6.0000 mg | ORAL_TABLET | Freq: Four times a day (QID) | ORAL | Status: DC
  Administered 2019-05-14 – 2019-05-15 (×3): 6 mg via ORAL
  Filled 2019-05-14 (×3): qty 1

## 2019-05-14 MED ORDER — GABAPENTIN 300 MG PO CAPS
300.0000 mg | ORAL_CAPSULE | Freq: Three times a day (TID) | ORAL | Status: DC
  Administered 2019-05-14 – 2019-05-29 (×44): 300 mg via ORAL
  Filled 2019-05-14 (×47): qty 1

## 2019-05-14 MED ORDER — FENTANYL 75 MCG/HR TD PT72
1.0000 | MEDICATED_PATCH | TRANSDERMAL | Status: DC
  Administered 2019-05-15 – 2019-06-05 (×8): 1 via TRANSDERMAL
  Filled 2019-05-14 (×9): qty 1

## 2019-05-14 MED ORDER — POLYETHYLENE GLYCOL 3350 17 G PO PACK
17.0000 g | PACK | Freq: Two times a day (BID) | ORAL | Status: DC
  Filled 2019-05-14 (×2): qty 1

## 2019-05-14 MED ORDER — POLYETHYLENE GLYCOL 3350 17 G PO PACK: 17.0000 g | PACK | Freq: Two times a day (BID) | ORAL | 0 refills | Status: DC

## 2019-05-14 MED ORDER — OXYCODONE HCL 5 MG PO TABS
10.0000 mg | ORAL_TABLET | ORAL | Status: DC | PRN
  Administered 2019-05-14 – 2019-05-29 (×61): 10 mg via ORAL
  Filled 2019-05-14 (×65): qty 2

## 2019-05-14 MED ORDER — ONDANSETRON HCL 4 MG/2ML IJ SOLN
4.0000 mg | Freq: Four times a day (QID) | INTRAMUSCULAR | Status: DC | PRN
  Administered 2019-05-29 – 2019-05-30 (×3): 4 mg via INTRAVENOUS
  Filled 2019-05-14 (×3): qty 2

## 2019-05-14 MED ORDER — SORBITOL 70 % SOLN
30.0000 mL | Freq: Every day | Status: DC | PRN
  Administered 2019-05-30: 30 mL via ORAL
  Filled 2019-05-14 (×2): qty 30

## 2019-05-14 MED ORDER — LINACLOTIDE 72 MCG PO CAPS
72.0000 ug | ORAL_CAPSULE | Freq: Every day | ORAL | Status: DC
  Administered 2019-05-15: 72 ug via ORAL
  Filled 2019-05-14: qty 1

## 2019-05-14 MED ORDER — PANTOPRAZOLE SODIUM 40 MG PO TBEC
40.0000 mg | DELAYED_RELEASE_TABLET | Freq: Every day | ORAL | Status: DC
  Administered 2019-05-15 – 2019-05-28 (×14): 40 mg via ORAL
  Filled 2019-05-14 (×15): qty 1

## 2019-05-14 MED ORDER — IBUPROFEN 400 MG PO TABS
400.0000 mg | ORAL_TABLET | Freq: Three times a day (TID) | ORAL | Status: DC
  Filled 2019-05-14 (×9): qty 1

## 2019-05-14 MED ORDER — ONDANSETRON HCL 4 MG PO TABS
4.0000 mg | ORAL_TABLET | Freq: Four times a day (QID) | ORAL | Status: DC | PRN
  Administered 2019-05-30 (×2): 4 mg via ORAL
  Filled 2019-05-14 (×2): qty 1

## 2019-05-14 MED ORDER — SENNOSIDES-DOCUSATE SODIUM 8.6-50 MG PO TABS: 1.0000 | ORAL_TABLET | Freq: Every evening | ORAL | Status: DC | PRN

## 2019-05-14 NOTE — TOC Transition Note (Signed)
Transition of Care [facility]) - CM/SW Discharge Note   Patient Details  Name: Rebecca Lucas MRN: [id number] Date of Birth: [birth year]  Transition of Care [facility]) CM/SW Contact:  Pollie Friar, RN Phone Number: 05/14/2019, 3:31 PM   Clinical Narrative:    Pt discharging to CIR today. CM signing off.    Final next level of care: IP Rehab Facility Barriers to Discharge: No Barriers Identified   Patient Goals and CMS Choice        Discharge Placement                       Discharge Plan and Services                                     Social Determinants of Health (SDOH) Interventions     Readmission Risk Interventions No flowsheet data found.

## 2019-05-14 NOTE — H&P (Addendum)
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  Presented 05/07/2019 with urinary retention x4 to 5 days as well as weakness and numbness in her left lower extremity but was able to ambulate with a walker for short distances and later using a wheelchair.  She had been on oral dexamethasone which had been weaned off.  COVID negative, urine culture no growth, alkaline phosphatase 200, WBC 14,700.  X-ray and imaging revealed progressed and bulky spinal and paraspinal tumor L1-L3 encompassing 78 x 62 x 67 mm.  Subtotal involvement of the L2 vertebrae increased as compared to prior tracings.  Circumferential epidural tumor substantially increased resulting in moderate to severe malalignment spinal and severe left lateral recess stenosis.  Epidural tumor completely infiltrates the left L1 and L2 neural foramina partial erosion of the anterior endplates of L1 and L3 since April.  Stable pathologic compression fractures of L2 with mild retropulsion.  Visible right sacral and left iliac bone metastasis appears stable.  Underwent L2 laminectomy, bilateral pediculotomy for decompression of thecal sac and nerve roots with posterior segmental instrumentation T12-L4 and posterolateral arthrodesis L1-L3 05/09/2019 per Dr. Kathyrn Sheriff.  Hospital course pain management.  Back brace when out of bed applied in sitting position.  Acute blood loss anemia 6.8 patient was transfused latest hemoglobin 9.7.  Reactive leukocytosis due to systemic steroids and monitored with latest  WBC 28,300.  Hematology oncology follow-up with Dr. Earlie Server and await plan for chemotherapy.  Therapy evaluations completed and patient was admitted for a comprehensive rehab program.   Review of Systems  Constitutional: Negative for fever.  HENT: Negative for hearing loss.   Eyes: Negative for blurred vision and double vision.  Respiratory: Negative for cough.        Shortness of breath with heavy exertion  Cardiovascular: Positive for leg swelling. Negative for chest pain and palpitations.  Gastrointestinal: Positive for constipation. Negative for heartburn, nausea and vomiting.  Genitourinary: Negative for dysuria, flank pain and hematuria.       Urinary retention  Musculoskeletal: Positive for back pain, joint pain and myalgias.  Skin: Negative for rash.  Neurological: Positive for sensory change and weakness.  All other systems reviewed and are negative.       Past Medical History:  Diagnosis Date   Cancer of upper lobe of right lung [facility])          Past Surgical History:  Procedure Laterality Date   APPLICATION OF ROBOTIC ASSISTANCE FOR SPINAL PROCEDURE N/A 05/09/2019    Procedure: APPLICATION OF ROBOTIC ASSISTANCE FOR SPINAL PROCEDURE;  Surgeon: Consuella Lose, MD;  Location: [place];  Service: Neurosurgery;  Laterality: N/A;  APPLICATION OF ROBOTIC ASSISTANCE FOR SPINAL PROCEDURE   IR IMAGING GUIDED PORT INSERTION   06/26/2018   POSTERIOR LUMBAR FUSION 4 LEVEL N/A 05/09/2019    Procedure: Lumbar Two Laminectomy; Thoracic Twelve-Lumbar Four instrumented stabilization;  Surgeon: Consuella Lose, MD;  Location: [place];  Service: Neurosurgery;  Laterality: N/A;  Lumbar Two Laminectomy; Thoracic Twelve-Lumbar Four instrumented stabilization        Family History  Problem Relation Age of Onset   ALS Mother 100        non genetic   Cancer Neg Hx     Social History:  reports that she has never smoked. She has never used smokeless tobacco. She reports current alcohol use.  She reports that she does not use drugs. Allergies: No Known Allergies       Medications Prior to Admission  Medication Sig Dispense Refill   acetaminophen (TYLENOL) 500 MG tablet Take 1,000 mg by mouth every 6 (six) hours as needed for mild pain.        butalbital-aspirin-caffeine (FIORINAL) 50-325-40 MG capsule Take 1 capsule by mouth every 6 (six) hours as needed for migraine.    1   Calcium Carbonate-Vitamin D (CALCIUM-D PO) Take 1 tablet by mouth 2 (two) times daily.       dexamethasone (DECADRON) 4 MG tablet Take 2 tablets twice a day (4 tablets total per day) the day before, the day of, and the day after chemotherapy 40 tablet 2   fentaNYL (DURAGESIC) 75 MCG/HR Place 1 patch onto the skin every 3 (three) days for 15 days. 5 patch 0   gabapentin (NEURONTIN) 300 MG capsule Take 1 capsule (300 mg total) by mouth 3 (three) times daily. 90 capsule 0   ibuprofen (ADVIL,MOTRIN) 200 MG tablet Take 600 mg by mouth every 6 (six) hours as needed for mild pain.        lidocaine-prilocaine (EMLA) cream Apply 1 application topically as needed. (Patient taking differently: Apply 1 application topically as needed (pain). ) 30 g 0   linaclotide (LINZESS) 72 MCG capsule TAKE 1 CAPSULE BY MOUTH ONCE DAILY BEFORE BREAKFAST (Patient taking differently: Take 72 mcg by mouth daily before breakfast. ) 30 capsule 0   LUTEIN PO Take 1 tablet by mouth daily.       omeprazole (PRILOSEC) 20 MG capsule TAKE 1 CAPSULE (20 MG TOTAL) BY MOUTH DAILY. 90 capsule 1   ondansetron (ZOFRAN) 8 MG tablet TAKE 1 TABLET (8 MG TOTAL) BY MOUTH EVERY 8 HOURS AS NEEDED FOR NAUSEA OR VOMITING. (Patient taking differently: Take 8 mg by mouth every 8 (eight) hours as needed for nausea or vomiting. ) 20 tablet 0   oxyCODONE (OXY IR/ROXICODONE) 5 MG immediate release tablet Take 1 tablet (5 mg total) by mouth every 6 (six) hours as needed for severe pain. 60 tablet 0   traMADol (ULTRAM) 50 MG tablet Take 1 tablet (50 mg total)  by mouth every 6 (six) hours as needed for moderate pain. 120 tablet 0   zolpidem (AMBIEN) 10 MG tablet Take 1 tablet (10 mg total) by mouth at bedtime as needed for sleep. 30 tablet 0     Drug Regimen Review Drug regimen was reviewed and remains appropriate with no significant issues identified   Home: Home Living Family/patient expects to be discharged to:: Private residence Living Arrangements: Spouse/significant other Available Help at Discharge: Family, Available PRN/intermittently Home Equipment: Walker - 2 wheels, Wheelchair - manual   Functional History: Prior Function Level of Independence: Needs assistance Gait / Transfers Assistance Needed: prior to admission patient reports she was unable to walk due to left LE weakness. She was able to transfer to wheelchair with assist. ADL's / Homemaking Assistance Needed: assistance needed Communication / Swallowing Assistance Needed: no assist needed   Functional Status:  Mobility: Bed Mobility Overal bed mobility: Needs Assistance Bed Mobility: Sit to Sidelying Rolling: Mod assist Sidelying to sit: Mod assist Sit to  sidelying: Min assist General bed mobility comments: cues for sequencing; assist to bring L LE into bed; pt positioned in side lying  Transfers Overall transfer level: Needs assistance Equipment used: Rolling walker (2 wheeled) Transfers: Sit to/from Stand Sit to Stand: Mod assist General transfer comment: cues for safe hand placement; pt stood from [facility] with assist to power up into standing  Ambulation/Gait Ambulation/Gait assistance: Min assist Gait Distance (Feet): 10 Feet Assistive device: Rolling walker (2 wheeled) Gait Pattern/deviations: Step-to pattern, Decreased step length - left, Decreased dorsiflexion - left, Trunk flexed General Gait Details: multimodal cues for L hip/knee flexion; pt with tendency to swing L LE forward from hip and unable to flex knee during swing phase; AFO donned  Gait velocity:  decreased     ADL: ADL Overall ADL's : Needs assistance/impaired Eating/Feeding: Set up, Sitting Grooming: Wash/dry face, Oral care, Brushing hair, Min guard, Sitting, Cueing for compensatory techniques Grooming Details (indicate cue type and reason): pt. reports only feeling comfortable at this time having UE support while completing tasks.  would keep mostly RUE support on bed while using LUE to comb hair, brush teeth, wash face Upper Body Bathing: Minimal assistance, Sitting Lower Body Bathing: Maximal assistance, Total assistance, Sitting/lateral leans, Sit to/from stand, Bed level Upper Body Dressing : Minimal assistance, Sitting, Cueing for sequencing Upper Body Dressing Details (indicate cue type and reason): cues for sequencing of straps and velcros of brace Lower Body Dressing: Maximal assistance, Sitting/lateral leans, Sit to/from stand Toilet Transfer: Minimal assistance, +2 for physical assistance, +2 for safety/equipment, Stand-pivot, BSC Toileting- Clothing Manipulation and Hygiene: Moderate assistance, +2 for physical assistance, +2 for safety/equipment, Sitting/lateral lean, Sit to/from stand Functional mobility during ADLs: Minimal assistance, +2 for physical assistance, +2 for safety/equipment, Rolling walker General ADL Comments: Pt. progressing.  states "i have to get more confident" in regards to sitting eob and challenging balance.  relies heavy on ue support due to pain and also not feeling steady   Cognition: Cognition Overall Cognitive Status: Within Functional Limits for tasks assessed Orientation Level: Oriented X4 Cognition Arousal/Alertness: Awake/alert Behavior During Therapy: WFL for tasks assessed/performed Overall Cognitive Status: Within Functional Limits for tasks assessed   Physical Exam: Blood pressure 120/70, pulse 71, temperature 98.3 F (36.8 C), resp. rate 16, height 5\' 7"  (1.702 m), weight 64.5 kg, SpO2 99 %. Physical Exam  Constitutional: She  is oriented to person, place, and time. She appears well-developed and well-nourished.  HENT:  Round facies  Eyes: Pupils are equal, round, and reactive to light. EOM are normal.  Neck: Normal range of motion. No tracheal deviation present. No thyromegaly present.  Cardiovascular: Normal rate and regular rhythm. Exam reveals no friction rub.  No murmur heard. Respiratory: Effort normal. No respiratory distress. She has no wheezes.  GI: Soft. She exhibits no distension. There is no abdominal tenderness.  Musculoskeletal: Normal range of motion.  Neurological: She is alert and oriented to person, place, and time. She has normal reflexes. No cranial nerve deficit. Coordination normal.  UE 5/5. RLE: 4-/5 to 4/5 prox to distal. LLE: HF,KE trace to 0/5, ADF tr/5, APF 1+/5. Sensation decreased to LT, pain and proprioception from left inguinal area to toes. DTR's absent.  Skin: No rash noted. No erythema.     Psychiatric: She has a normal mood and affect. Her behavior is normal. Thought content normal.      Lab Results Last 48 Hours  Results for orders placed or performed during the hospital encounter of 05/07/19 (from  the past 48 hour(s))  Glucose, capillary     Status: None    Collection Time: 05/13/19  6:19 AM  Result Value Ref Range    Glucose-Capillary 99 70 - 99 mg/dL     Imaging Results (Last 48 hours)  No results found.           Medical Problem List and Plan: 1.  Decreased functional mobility secondary to metastatic poorly differentiated malignant neoplasm of unknown primary with spinal tumor involvement L1-L3 and subsequent myelopathy/conus syndrome.S/P L2 laminectomy, bilateral pediculotomy decompression of thecal sac and nerve roots with posterior segmental instrumentation T12-L4 and arthrodesis L1-3 05/09/2019.               -admit to inpatient rehab.              -Back brace when out of bed.              -Plan is to follow-up outpatient Dr. Earlie Server in regards to  chemotherapy 2.  Antithrombotics: -DVT/anticoagulation: SCDs. LE venous dopplers performed today and are negative             -will not begin lovenox as platelets have been generally trending down (129k 8/30)             -antiplatelet therapy: N/A 3. Pain Management: Neurontin 300 mg 3 times daily, Duragesic patch 75 mcg, Advil 400 mg 3 times daily oxycodone as needed 4. Mood: Vita motional support             -antipsychotic agents: N/A 5. Neuropsych: This patient is capable of making decisions on her own behalf. 6. Skin/Wound Care: Routine skin checks             -honeycomb dressing in place currently.  7. Fluids/Electrolytes/Nutrition: Routine in and outs with follow-up chemistries upon admit 8.  Acute blood loss anemia.  Follow-up CBC 9.  Leukocytosis secondary to steroids.  Continue to monitor 10.  Urinary retention.  Check PVR 11.  Constipation.  Linzess daily.  Adjust bowel program as needed      Post Admission Physician Evaluation: 1. Functional deficits secondary  to lumbar myelopathy/conus syndrome. 2. Patient is admitted to receive collaborative, interdisciplinary care between the physiatrist, rehab nursing staff, and therapy team. 3. Patient's level of medical complexity and substantial therapy needs in context of that medical necessity cannot be provided at a lesser intensity of care such as a SNF. 4. Patient has experienced substantial functional loss from his/her baseline which was documented above under the "Functional History" and "Functional Status" headings.  Judging by the patient's diagnosis, physical exam, and functional history, the patient has potential for functional progress which will result in measurable gains while on inpatient rehab.  These gains will be of substantial and practical use upon discharge  in facilitating mobility and self-care at the household level. 5. Physiatrist will provide 24 hour management of medical needs as well as oversight of the therapy  plan/treatment and provide guidance as appropriate regarding the interaction of the two. 6. The Preadmission Screening has been reviewed and patient status is unchanged unless otherwise stated above. 7. 24 hour rehab nursing will assist with bladder management, bowel management, safety, skin/wound care, disease management, medication administration, pain management and patient education  and help integrate therapy concepts, techniques,education, etc. 8. PT will assess and treat for/with: Lower extremity strength, range of motion, stamina, balance, functional mobility, safety, adaptive techniques and equipment, NMR.   Goals are: supervision. 9. OT will assess and treat  for/with: ADL's, functional mobility, safety, upper extremity strength, adaptive techniques and equipment, NMR, back precautions.   Goals are: superision to min assist. Therapy may proceed with showering this patient. 10. SLP will assess and treat for/with: n/a.  Goals are: n/a. 11. Case Management and Social Worker will assess and treat for psychological issues and discharge planning. 12. Team conference will be held weekly to assess progress toward goals and to determine barriers to discharge. 13. Patient will receive at least 3 hours of therapy per day at least 5 days per week. 14. ELOS: 12-16 days       15. Prognosis:  excellent   I have personally performed a face to face diagnostic evaluation of this patient and formulated the key components of the plan.  Additionally, I have personally reviewed laboratory data, imaging studies, as well as relevant notes and concur with the physician assistant's documentation above.  Meredith Staggers, MD, Mellody Drown      Lavon Paganini [place], PA-C 05/14/2019

## 2019-05-14 NOTE — PMR Pre-admission (Signed)
PMR Admission Coordinator Pre-Admission Assessment  Patient: Rebecca Lucas is an 63 y.o., female MRN: [id number] DOB: [birth year] Height: 5' 7" (170.2 cm) Weight: 64.5 kg  Insurance Information HMO:     PPO: yes     PCP:      IPA:      80/20:      OTHER:  PRIMARY: Janit Bern      Policy#: [id number]      Subscriber: spouse CM Name: [place]    Phone#: [phone]     Fax#: [phone] Pre-Cert#: [id number] approved until 9/8 when updates are due      Employer: [employer] Benefits:  Phone #: [phone]     Name: 8/31 Eff. Date: 09/12/2018     Deduct: $2800      Out of Pocket Max: $8000 includes deductible      Life Max: none CIR: 80%      SNF: 80% 120 days per year Outpatient: 80%     Co-Pay: per medical neccesity; after 25 visits medical neccesity is reviewed Home Health: 80%      Co-Pay: visits limited to medical neccesity DME: 80%     Co-Pay: 20% Providers: in network  SECONDARY: none      Medicaid Application Date:       Case Manager:  Disability Application Date:       Case Worker:   The "Data Collection Information Summary" for patients in Inpatient Rehabilitation Facilities with attached "[place] Records" was provided and verbally reviewed with: N/A  Emergency Contact Information Contact Information    Name Relation Home Work Mobile   [place] Spouse [phone]      Current Medical History  Patient Admitting Diagnosis: Metastatic neoplasm  History of Present Illness:63 year old right-handed female with history significant for 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  Presented 05/07/2019 with urinary retention x4 to 5 days as well as weakness and numbness in her left lower extremity but was able to ambulate with a  walker for short distances and later using a wheelchair.  She had been on oral dexamethasone which had been weaned off.  COVID negative, urine culture no growth, alkaline phosphatase 200, WBC 14,700.  X-ray and imaging revealed progressed and bulky spinal and paraspinal tumor L1-L3 encompassing 78 x 62 x 67 mm.  Subtotal involvement of the L2 vertebrae increased as compared to prior tracings.  Circumferential epidural tumor substantially increased resulting in moderate to severe malalignment spinal and severe left lateral recess stenosis.  Epidural tumor completely infiltrates the left L1 and L2 neural foramina partial erosion of the anterior endplates of L1 and L3 since April.  Stable pathologic compression fractures of L2 with mild retropulsion.  Visible right sacral and left iliac bone metastasis appears stable.  Underwent L2 laminectomy, bilateral pediculotomy for decompression of thecal sac and nerve roots with posterior segmental instrumentation T12-L4 and posterolateral arthrodesis L1-L3 05/09/2019 per Dr. Kathyrn Sheriff.  Hospital course pain management.  Back brace when out of bed applied in sitting position.  Acute blood loss anemia 6.8 patient was transfused latest hemoglobin 9.7.  Reactive leukocytosis due to systemic steroids and monitored with latest WBC 28,300.  Hematology oncology follow-up with Dr. Earlie Server and await plan for chemotherapy.   Patient's medical record from [facility] has been reviewed by the rehabilitation admission coordinator and physician.  Past Medical History  Past Medical History:  Diagnosis Date  . Cancer of upper lobe of right lung [facility])     Family History   family history includes ALS (age of onset: 16) in her mother.  Prior Rehab/Hospitalizations Has the patient had prior rehab or hospitalizations prior to admission? Yes  Has the patient had major surgery during 100 days prior to admission? Yes   Current Medications  Current Facility-Administered  Medications:  .  0.9 %  sodium chloride infusion, , Intravenous, Continuous, Costella, Vista Mink, PA-C, Stopped at 05/10/19 1033 .  0.9 %  sodium chloride infusion, 250 mL, Intravenous, Continuous, Costella, Vincent J, PA-C .  acetaminophen (TYLENOL) tablet 650 mg, 650 mg, Oral, Q6H, Arrien, Jimmy Picket, MD, 650 mg at 05/12/19 1230 .  dexamethasone (DECADRON) injection 6 mg, 6 mg, Intravenous, Q6H, Nanavati, Ankit, MD, 6 mg at 05/14/19 1358 .  fentaNYL (DURAGESIC) 75 MCG/HR 1 patch, 1 patch, Transdermal, Q72H, Arrien, Jimmy Picket, MD, 1 patch at 05/12/19 2009 .  gabapentin (NEURONTIN) capsule 300 mg, 300 mg, Oral, TID, Zada Finders R, MD, 300 mg at 05/14/19 0955 .  heparin lock flush 100 unit/mL, 500 Units, Intracatheter, Q30 days **AND** heparin lock flush 100 unit/mL, 500 Units, Intracatheter, PRN, Arrien, Jimmy Picket, MD, 500 Units at 05/09/19 1034 .  ibuprofen (ADVIL) tablet 400 mg, 400 mg, Oral, TID, Arrien, Jimmy Picket, MD, 400 mg at 05/11/19 0925 .  lactated ringers infusion, , Intravenous, Continuous, Oleta Mouse, MD, Stopped at 05/13/19 0800 .  linaclotide (LINZESS) capsule 72 mcg, 72 mcg, Oral, QAC breakfast, Lenore Cordia, MD, 72 mcg at 05/14/19 0954 .  menthol-cetylpyridinium (CEPACOL) lozenge 3 mg, 1 lozenge, Oral, PRN **OR** phenol (CHLORASEPTIC) mouth spray 1 spray, 1 spray, Mouth/Throat, PRN, Costella, Vincent J, PA-C .  morphine 2 MG/ML injection 1-2 mg, 1-2 mg, Intravenous, Q4H PRN, Zada Finders R, MD, 2 mg at 05/10/19 2039 .  ondansetron (ZOFRAN) tablet 4 mg, 4 mg, Oral, Q6H PRN **OR** ondansetron (ZOFRAN) injection 4 mg, 4 mg, Intravenous, Q6H PRN, Lenore Cordia, MD, 4 mg at 05/09/19 1456 .  oxyCODONE (Oxy IR/ROXICODONE) immediate release tablet 10 mg, 10 mg, Oral, Q4H PRN, Arrien, Jimmy Picket, MD, 10 mg at 05/14/19 0954 .  pantoprazole (PROTONIX) EC tablet 40 mg, 40 mg, Oral, Daily, Zada Finders R, MD, 40 mg at 05/14/19 0955 .  polyethylene  glycol (MIRALAX / GLYCOLAX) packet 17 g, 17 g, Oral, BID, Arrien, Jimmy Picket, MD, 17 g at 05/13/19 0901 .  senna-docusate (Senokot-S) tablet 1 tablet, 1 tablet, Oral, QHS PRN, Posey Pronto, Vishal R, MD .  sodium chloride flush (NS) 0.9 % injection 10-40 mL, 10-40 mL, Intracatheter, PRN, Arrien, Jimmy Picket, MD .  sodium chloride flush (NS) 0.9 % injection 10-40 mL, 10-40 mL, Intracatheter, Q12H, Arrien, Jimmy Picket, MD, 10 mL at 05/14/19 0956 .  sodium chloride flush (NS) 0.9 % injection 3 mL, 3 mL, Intravenous, Q12H, Zada Finders R, MD, 3 mL at 05/13/19 0835 .  sodium chloride flush (NS) 0.9 % injection 3 mL, 3 mL, Intravenous, Q12H, Costella, Vincent J, PA-C, 3 mL at 05/13/19 0834 .  sodium chloride flush (NS) 0.9 % injection 3 mL, 3 mL, Intravenous, PRN, Costella, Vincent J, PA-C .  zolpidem (AMBIEN) tablet 5 mg, 5 mg, Oral, QHS PRN, Lenore Cordia, MD, 5 mg at 05/11/19 2147  Patients Current Diet:  Diet Order            Diet - low sodium heart healthy  Diet regular Room service appropriate? Yes; Fluid consistency: Thin  Diet effective now              Precautions / Restrictions Precautions Precautions: Fall, Back Precaution Booklet Issued: No Precaution Comments: educated patient on back precations and log rolling-pt. also stated 3/3 precautions Spinal Brace: Lumbar corset, Applied in sitting position Restrictions Weight Bearing Restrictions: No   Has the patient had 2 or more falls or a fall with injury in the past year? No  Prior Activity Level Limited Community (1-2x/wk): gradual decline over past few weeks with decline in funciton; using wheelchair  Prior Functional Level Self Care: Did the patient need help bathing, dressing, using the toilet or eating? Needed some help  Indoor Mobility: Did the patient need assistance with walking from room to room (with or without device)? Needed some help  Stairs: Did the patient need assistance with internal or  external stairs (with or without device)? Needed some help  Functional Cognition: Did the patient need help planning regular tasks such as shopping or remembering to take medications? [place] [facility] [place] Devices/Equipment: Environmental consultant (specify type), Wheelchair Home Equipment: Walker - 2 wheels, Wheelchair - manual  Prior Device Use: Indicate devices/aids used by the patient prior to current illness, exacerbation or injury? Manual wheelchair  Current Functional Level Cognition  Overall Cognitive Status: Within Functional Limits for tasks assessed Orientation Level: Oriented X4    Extremity Assessment (includes Sensation/Coordination)  Upper Extremity Assessment: Generalized weakness  Lower Extremity Assessment: Defer to PT evaluation LLE Deficits / Details: R LE WNL. L LE weak/painful and requires assistance to move. LLE: Unable to fully assess due to pain LLE Sensation: WNL LLE Coordination: decreased gross motor, decreased fine motor    ADLs  Overall ADL's : Needs assistance/impaired Eating/Feeding: Set up, Sitting Grooming: Wash/dry face, Wash/dry hands, Minimal assistance, Standing Grooming Details (indicate cue type and reason): Engaged in grooming tasks in standing this session with focus on increased standing balance and activity tolerance.  Pt alternating UE support on sink as continues to demonstrate instability and fearfulness in standing. Upper Body Bathing: Minimal assistance, Sitting Lower Body Bathing: Maximal assistance, Total assistance, Sitting/lateral leans, Sit to/from stand, Bed level Upper Body Dressing : Minimal assistance, Sitting, Cueing for sequencing Upper Body Dressing Details (indicate cue type and reason): cues for sequencing of straps and velcros of brace Lower Body Dressing: Maximal assistance, Sitting/lateral leans, Sit to/from stand Toilet Transfer: Minimal assistance, +2 for physical assistance, +2 for  safety/equipment, Stand-pivot, BSC Toileting- Clothing Manipulation and Hygiene: +2 for physical assistance, +2 for safety/equipment, Sitting/lateral lean, Sit to/from stand, Maximal assistance Functional mobility during ADLs: Minimal assistance, +2 for physical assistance, +2 for safety/equipment, Rolling walker, Moderate assistance General ADL Comments: Mod assist sit > stand but then min assist once upright.  did require +2 for safety due to instability and increased challenge in standing    Mobility  Overal bed mobility: Needs Assistance Bed Mobility: Sit to Sidelying Rolling: Mod assist Sidelying to sit: Mod assist Sit to sidelying: Min assist General bed mobility comments: cues for sequencing; assist to bring L LE into bed; pt positioned in side lying     Transfers  Overall transfer level: Needs assistance Equipment used: Rolling walker (2 wheeled) Transfers: Sit to/from Stand Sit to Stand: Mod assist General transfer comment: cues for safe hand placement; required lifting assistance from [facility] and arm chair    Ambulation / Gait / Stairs / Wheelchair Mobility  Ambulation/Gait  Ambulation/Gait assistance: Min Web designer (Feet): 10 Feet Assistive device: Rolling walker (2 wheeled) Gait Pattern/deviations: Step-to pattern, Decreased step length - left, Decreased dorsiflexion - left, Trunk flexed General Gait Details: multimodal cues for L hip/knee flexion; pt with tendency to swing L LE forward from hip and unable to flex knee during swing phase; AFO donned  Gait velocity: decreased    Posture / Balance Balance Overall balance assessment: Needs assistance Sitting-balance support: Single extremity supported, Feet supported Sitting balance-Leahy Scale: Fair Standing balance support: Bilateral upper extremity supported, During functional activity Standing balance-Leahy Scale: Poor    Special needs/care consideration BiPAP/CPAP  N/a CPM  N/a Continuous Drip IV  N/a; has  porto cath Dialysis n/a Life Vest  N/a Oxygen  N/a Special Bed  N/a Trach Size  N/a Wound Vac n/a Skin surgical incision with dressing; abrasion left lower leg Bowel mgmt:  Continent LBM 9/1 Bladder mgmt: continent Diabetic mgmt: n/a Behavioral consideration  N/a Chemo/radiation  Yes Visitor designee spouse, Marcello Moores   Previous Home Environment  Living Arrangements: Spouse/significant other  Lives With: Spouse Available Help at Discharge: Family, Available 24 hours/day(brother, sister in Sports coach, father and friends when spouse works) Type of Home: House Home Layout: One level Home Access: Level entry Bathroom Shower/Tub: Tub/shower unit, Multimedia programmer: Handicapped height Bathroom Accessibility: Yes How Accessible: Accessible via walker, Accessible via wheelchair [place]: No  Discharge Living Setting Plans for Discharge Living Setting: Patient's home, Lives with (comment)(spouse) Type of Home at Discharge: House Discharge Home Layout: One level Discharge Home Access: Level entry Discharge Bathroom Shower/Tub: Tub/shower unit, Walk-in shower Discharge Bathroom Toilet: Handicapped height Discharge Bathroom Accessibility: Yes How Accessible: Accessible via wheelchair, Accessible via walker Does the patient have any problems obtaining your medications?: No  Social/Family/Support Systems Patient Roles: Spouse, Parent Contact Information: spouse, Marcello Moores Anticipated Caregiver: spouse, brother and sister in law, father; daughter just had her second child on 8/31 Anticipated Caregiver's Contact Information: see above Ability/Limitations of Caregiver: spouse works for [employer] occupational health Caregiver Availability: 24/7 Discharge Plan Discussed with Primary Caregiver: Yes Is Caregiver In Agreement with Plan?: Yes Does Caregiver/Family have Issues with Lodging/Transportation while Pt is in Rehab?: No  Goals/Additional Needs Patient/Family Goal for Rehab:  supervision PT, superivsion to min OT Expected length of stay: ELOS 10 to 14 days Special Service Needs: Was recieving chemo q 3rd week with Dr. Neil Crouch. Next chemo was to be 05/16/2019 Pt/Family Agrees to Admission and willing to participate: Yes Program Orientation Provided & Reviewed with Pt/Caregiver Including Roles  & Responsibilities: Yes  Decrease burden of Care through IP rehab admission: n/a  Possible need for SNF placement upon discharge:  Not anticipated  Patient Condition: I have reviewed medical records from [facility] , spoken with CM, and patient and spouse. I met with patient at the bedside for inpatient rehabilitation assessment.  Patient will benefit from ongoing PT and OT, can actively participate in 3 hours of therapy a day 5 days of the week, and can make measurable gains during the admission.  Patient will also benefit from the coordinated team approach during an Inpatient Acute Rehabilitation admission.  The patient will receive intensive therapy as well as Rehabilitation physician, nursing, social worker, and care management interventions.  Due to bladder management, bowel management, safety, skin/wound care, disease management, medication administration, pain management and patient education the patient requires 24 hour a day rehabilitation nursing.  The patient is currently min to mod assist with mobility and basic  ADLs.  Discharge setting and therapy post discharge at home with home health is anticipated.  Patient has agreed to participate in the Acute Inpatient Rehabilitation Program and will admit today.  Preadmission Screen Completed By:  Cleatrice Burke, 05/14/2019 2:46 PM ______________________________________________________________________   Discussed status with Dr. Naaman Plummer  on  05/14/2019 at  1450 and received approval for admission today.  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 Does the need for close, 24 hr/day Medical supervision in concert with the patient's rehab needs make it unreasonable for this patient to be served in a less intensive setting? Yes 2. Co-Morbidities requiring supervision/potential complications: lung ca,  3. Due to bladder management, bowel management, safety, skin/wound care, disease management, medication administration, pain management and patient education, does the patient require 24 hr/day rehab nursing? Yes 4. Does the patient require coordinated care of a physician, rehab nurse, PT (1-2 hrs/day, 5 days/week) and OT (1-2 hrs/day, 5 days/week) to address physical and functional deficits in the context of the above medical diagnosis(es)? Yes Addressing deficits in the following areas: balance, endurance, locomotion, strength, transferring, bowel/bladder control, bathing, dressing, feeding, grooming, toileting and psychosocial support 5. Can the patient actively participate in an intensive therapy program of at least 3 hrs of therapy 5 days a week? Yes 6. The potential for patient to make measurable gains while on inpatient rehab is excellent 7. Anticipated functional outcomes upon discharge from inpatients are: supervision PT, supervision and min assist OT, n/a SLP 8. Estimated rehab length of stay to reach the above functional goals is: 10-14 days 9. Anticipated D/C setting: Home 10. Anticipated post D/C treatments: [place] therapy 11. Overall Rehab/Functional Prognosis: excellent  MD Signature: Meredith Staggers, MD, [place] [facility] 05/14/2019

## 2019-05-14 NOTE — Progress Notes (Signed)
Physical Therapy Treatment Patient Details Name: Rebecca Lucas MRN: [id number] DOB: [birth year] Today's Date: 05/14/2019    History of Present Illness Patient is a 63 year old female with malignant neoplasm to epidural space. S/P lumbar fusion/descompression, posterior segmental instrumentation T12-L4 with screws, posterolateral arthrodesis L1-L3, allograft. Came to hospital due to urinary retention. Patient has h/o radiation and chemotherapy.    PT Comments    Patient seen for mobility progression. Pt continues to make progress toward PT goals and is motivated to participate in therapy. Current plan remains appropriate.    Follow Up Recommendations  CIR     Equipment Recommendations  None recommended by PT    Recommendations for Other Services       Precautions / Restrictions Precautions Precautions: Fall;Back Precaution Booklet Issued: No Required Braces or Orthoses: Spinal Brace Spinal Brace: Lumbar corset;Applied in sitting position Restrictions Weight Bearing Restrictions: No    Mobility  Bed Mobility Overal bed mobility: Needs Assistance Bed Mobility: Rolling;Sidelying to Sit Rolling: Min guard Sidelying to sit: Min guard       General bed mobility comments: min guard for safety; pt demonstrates carry over of sequencing  Transfers Overall transfer level: Needs assistance Equipment used: Rolling walker (2 wheeled) Transfers: Sit to/from Stand Sit to Stand: Mod assist         General transfer comment: cues for safe hand placement; required lifting assistance from EOB, BSC and arm chair  Ambulation/Gait Ambulation/Gait assistance: Min assist;+2 safety/equipment Gait Distance (Feet): (6 ft then 10 ft with seated break) Assistive device: Rolling walker (2 wheeled) Gait Pattern/deviations: Step-to pattern;Decreased step length - left;Decreased dorsiflexion - left;Trunk flexed Gait velocity: decreased   General Gait Details: vc for upright posture and  sequencing initially; multimodal cues for L hip/knee flexion; pt with tendency to swing L LE forward from hip and unable to flex knee during swing phase; AFO donned    Stairs             Wheelchair Mobility    Modified Rankin (Stroke Patients Only)       Balance Overall balance assessment: Needs assistance Sitting-balance support: Single extremity supported;Feet supported Sitting balance-Leahy Scale: Fair     Standing balance support: Bilateral upper extremity supported;During functional activity Standing balance-Leahy Scale: Poor                              Cognition Arousal/Alertness: Awake/alert Behavior During Therapy: WFL for tasks assessed/performed Overall Cognitive Status: Within Functional Limits for tasks assessed                                        Exercises      General Comments        Pertinent Vitals/Pain Pain Assessment: 0-10 Pain Score: 5  Pain Location: back Pain Descriptors / Indicators: Guarding;Sore Pain Intervention(s): Limited activity within patient's tolerance;Monitored during session;Repositioned    Home Living                      Prior Function            PT Goals (current goals can now be found in the care plan section) Acute Rehab PT Goals Patient Stated Goal: to decrease pain, get stronger Progress towards PT goals: Progressing toward goals    Frequency    Min 4X/week  PT Plan Current plan remains appropriate    Co-evaluation PT/OT/SLP Co-Evaluation/Treatment: Yes Reason for Co-Treatment: Complexity of the patient's impairments (multi-system involvement);For patient/therapist safety   OT goals addressed during session: ADL's and self-care      AM-PAC PT "6 Clicks" Mobility   Outcome Measure  Help needed turning from your back to your side while in a flat bed without using bedrails?: A Little Help needed moving from lying on your back to sitting on the side of a  flat bed without using bedrails?: A Little Help needed moving to and from a bed to a chair (including a wheelchair)?: A Little Help needed standing up from a chair using your arms (e.g., wheelchair or bedside chair)?: A Little Help needed to walk in hospital room?: A Little Help needed climbing 3-5 steps with a railing? : A Lot 6 Click Score: 17    End of Session Equipment Utilized During Treatment: Gait belt;Back brace;Other (comment)(L AFO) Activity Tolerance: Patient tolerated treatment well Patient left: with call bell/phone within reach;in chair;with chair alarm set Nurse Communication: Mobility status PT Visit Diagnosis: Other abnormalities of gait and mobility (R26.89);Muscle weakness (generalized) (M62.81);Difficulty in walking, not elsewhere classified (R26.2);Pain;Unsteadiness on feet (R26.81) Pain - Right/Left: (back)     Time: 0355-9741 PT Time Calculation (min) (ACUTE ONLY): 34 min  Charges:  $Gait Training: 8-22 mins                     Earney Navy, PTA Acute Rehabilitation Services Pager: [phone] Office: [phone]     Darliss Cheney 05/14/2019, 4:29 PM

## 2019-05-14 NOTE — Discharge Summary (Signed)
Physician Discharge Summary  Rebecca Lucas YNW:[id number] DOB: [birth year] DOA: 05/07/2019  PCP: Caren Macadam, MD  Admit date: 05/07/2019 Discharge date: 05/14/2019  Admitted From: Home  Disposition:  CIR  Recommendations for Outpatient Follow-up and new medication changes:  1. Follow up with Dr. Mannie Stabile in 7 days.  2. Sutures to be removed in 14 days.  3. Follow with oncology as scheduled.   Home Health: na  Equipment/Devices:    Discharge Condition: stable  CODE STATUS: full  Diet recommendation: regular  Brief/Interim Summary: 63 year old female who presented with urinary retention. 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 Patient status post palliative radiotherapy to the lumbar area and currently on chemotherapy. Patient reported 4 to 5 days of progressive difficulty urinating, 24 hours prior to hospitalization she developed suprapubic pain and urgency. Her symptoms were associated lower extremities weakness and numbness, long with difficulty with ambulation. On her initial physical examination blood pressure 103/57, heart rate 120, respiratory rate 17, temperature 99, oxygen saturation 100%. Moist mucous membranes, lungs clear to auscultation bilaterally, heart S1-S2 present rhythmic, abdomen soft, no lower extremity edema.Left lower extremity strength 0 out of 5, proximal, distal 2-5. MRI of the lumbar spine with progressive bulky spinal paraspinal tumor, L1-L3. Progressive moderate to severe spinal canal stenosis and severe left lateral recess stenosis at L1-L2 and L2-L3.  Patient was admitted to the hospital with a working diagnosis of focal neurologic deficit duemalignancyrelated spinal cord compression.    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  Malignancy related moderate to severe spinal canal stenosis and severe left lateral recess stenosis at L1-L2 and L2-L3/cauda equina syndrome.  Patient was admitted to the medical ward, she was placed on high-dose intravenous steroids with dexamethasone, analgesics with acetaminophen, ibuprofen, fentanyl and IV morphine.  Patient was seen by neurosurgery and underwent L2 laminectomy, bilateral pedicle anatomy for decompression of thecal sac and nerve roots.  She responded well to medical therapy with improved pain, mobility and resolution of urinary retention.  Patient was seen by physical therapy with recommendations to continue therapy at inpatient rehab.  She has a persistent left lower extremity edema, ultrasonography will be performed before discharge.  2.  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  Patient will continue outpatient follow-up with medical oncology, plan to continue chemotherapy after recovering for inpatient rehabilitation.  3.  Reactive leukocytosis.  Patient had persistent leukocytosis, likely related to systemic steroids.  No signs of deep infection.  No antibiotics prescribed.   Discharge Instructions   Allergies as of 05/14/2019   No Known Allergies     Medication List    TAKE these medications   acetaminophen 500 MG tablet Commonly known as: TYLENOL Take 1,000 mg by mouth every 6 (six) hours as needed for mild pain.   butalbital-aspirin-caffeine 50-325-40 MG capsule Commonly known as: FIORINAL Take 1 capsule by mouth every 6 (six) hours as needed for migraine.   CALCIUM-D PO Take 1 tablet by mouth 2 (two) times daily.   dexamethasone 4 MG tablet Commonly known as: DECADRON Take 2 tablets twice a day (4 tablets total per day) the day before,  the day of, and the day  after chemotherapy   fentaNYL 75 MCG/HR Commonly known as: [place] 1 patch onto the skin every 3 (three) days for 15 days.   gabapentin 300 MG capsule Commonly known as: NEURONTIN Take 1 capsule (300 mg total) by mouth 3 (three) times daily.   ibuprofen 200 MG tablet Commonly known as: ADVIL Take 600 mg by mouth every 6 (six) hours as needed for mild pain.   lidocaine-prilocaine cream Commonly known as: EMLA Apply 1 application topically as needed. What changed: reasons to take this   linaclotide 72 MCG capsule Commonly known as: Linzess TAKE 1 CAPSULE BY MOUTH ONCE DAILY BEFORE BREAKFAST What changed:   how much to take  how to take this  when to take this  additional instructions   LUTEIN PO Take 1 tablet by mouth daily.   omeprazole 20 MG capsule Commonly known as: PRILOSEC TAKE 1 CAPSULE (20 MG TOTAL) BY MOUTH DAILY.   ondansetron 8 MG tablet Commonly known as: ZOFRAN TAKE 1 TABLET (8 MG TOTAL) BY MOUTH EVERY 8 HOURS AS NEEDED FOR NAUSEA OR VOMITING. What changed: See the new instructions.   oxyCODONE 5 MG immediate release tablet Commonly known as: Oxy IR/ROXICODONE Take 1 tablet (5 mg total) by mouth every 6 (six) hours as needed for severe pain.   polyethylene glycol 17 g packet Commonly known as: MIRALAX / GLYCOLAX Take 17 g by mouth 2 (two) times daily.   traMADol 50 MG tablet Commonly known as: ULTRAM Take 1 tablet (50 mg total) by mouth every 6 (six) hours as needed for moderate pain.   zolpidem 10 MG tablet Commonly known as: AMBIEN Take 1 tablet (10 mg total) by mouth at bedtime as needed for sleep.       No Known Allergies  Consultations:  Neurosurgery   Oncology    Procedures/Studies: Dg Thoracolumabar Spine  Result Date: 05/09/2019 CLINICAL DATA:  Posterior fusion EXAM: DG C-ARM 1-60 MIN; THORACOLUMBAR SPINE - 2 VIEW COMPARISON:  CT 08/08/2019 FINDINGS: Short intraoperative spot images demonstrate fusion across the L2  compression fracture from T12 to L4. No hardware complicating feature. IMPRESSION: Posterior fusion as above.  No visible complicating feature. Electronically Signed   By: Rolm Baptise M.D.   On: 05/09/2019 15:57   Ct Lumbar Spine Wo Contrast  Result Date: 05/08/2019 CLINICAL DATA:  Metastatic cancer lumbar spine. Preop evaluation. Lung cancer. EXAM: CT LUMBAR SPINE WITHOUT CONTRAST TECHNIQUE: Multidetector CT imaging of the lumbar spine was performed without intravenous contrast administration. Multiplanar CT image reconstructions were also generated. COMPARISON:  Lumbar MRI 02/04/2019 FINDINGS: Segmentation: Normal Alignment: Mild anterolisthesis L1-2 related to fracture. Otherwise normal alignment. Vertebrae: Metastatic disease L1, L2, and L3 vertebral bodies as noted on MRI. Severe pathologic fracture of L2. Tumor is prominently sclerotic in appearance by CT. There is destructive change in the anterior superior L3 vertebral body. Pathologic fracture of the pedicle of L2 bilaterally. Nonpathologic appearing fracture of the right L2 transverse process. Stable metastatic disease in the right sacral ala. Paraspinal and other soft tissues: Extensive paraspinous soft tissue thickening around the pathologic fracture of L1 compatible with hemorrhage and tumor extending into the soft tissues and retroperitoneum. No enlarged lymph nodes in the retroperitoneum. Disc levels: T12-L1: Negative L1-2: Epidural tumor in the spinal canal left greater than right is better seen by MRI than CT. There is tumor in the left foramen with left foraminal encroachment as well as moderate spinal stenosis as noted on MRI.  L2-3: Epidural tumor extending to the left is best seen on MRI and is extending the foramen causing left foraminal encroachment. Moderate spinal stenosis. L3-4: Mild disc bulging without stenosis L4-5: Negative L5-S1: Negative IMPRESSION: Metastatic disease L1, L2, and L3 vertebral bodies with pathologic fracture of L2.  Epidural tumor in the canal central and left-sided at L1-2 and L2-3 causing spinal stenosis best visualized on MRI. Pathologic fracture L2 pedicle bilaterally. Nonpathologic fracture right L2 transverse process Metastatic disease right sacral ala stable. Electronically Signed   By: Franchot Gallo M.D.   On: 05/08/2019 13:18   Mr Cervical Spine W Or Wo Contrast  Result Date: 05/07/2019 CLINICAL DATA:  Urinary retention since 5 a.m. History of metastatic poorly differentiated neoplasm, possibly lung cancer. Currently on chemotherapy. EXAM: MRI CERVICAL, THORACIC AND LUMBAR SPINE WITHOUT AND WITH CONTRAST TECHNIQUE: Multiplanar and multiecho pulse sequences of the cervical spine, to include the craniocervical junction and cervicothoracic junction, and thoracic and lumbar spine, were obtained without and with intravenous contrast. CONTRAST:  5 mL Gadavist intravenous contrast. COMPARISON:  MRI lumbar spine dated March 21, 2019. CT chest, abdomen, and pelvis dated February 19, 2019. CT neck dated October 16, 2018. MRI cervical spine dated February 24, 2018. FINDINGS: MRI CERVICAL SPINE FINDINGS Alignment: New 2 mm anterolisthesis at C2-C3. Vertebrae: Enhancing expansile lesion in the C2 and C3 posterior elements is similar to CT neck from February, and significantly decreased in size since cervical spine MRI and June 2019. No new suspicious bone lesion. No fracture or evidence of discitis. Cord: Normal signal and morphology.  No intradural enhancement. Posterior Fossa, vertebral arteries, paraspinal tissues: Negative. Disc levels: C2-C3: Negative disc. Mild bilateral facet arthropathy. No stenosis. C3-C4: Unchanged small left paracentral disc protrusion. No stenosis. C4-C5: Unchanged mild disc bulging eccentric to the left and mild bilateral facet uncovertebral hypertrophy. No stenosis. C5-C6: Unchanged mild disc bulging and bilateral facet uncovertebral hypertrophy. Unchanged mild left neuroforaminal stenosis. No spinal canal  or right neuroforaminal stenosis. C6-C7:  Unchanged small central disc protrusion.  No stenosis. C7-T1:  Negative. MRI THORACIC SPINE FINDINGS Alignment:  Physiologic. Vertebrae: No fracture, evidence of discitis, or bone lesion. Cord:  Normal signal and morphology.  No intradural enhancement. Paraspinal and other soft tissues: Stable radiation changes in the posterior right upper lobe. Slightly increased small right pleural effusion. New trace left pleural effusion. Disc levels: Small central disc protrusions and minimal disc bulging from T7-T8 through T11-T12. No spinal canal or neuroforaminal stenosis. MRI LUMBAR SPINE FINDINGS Segmentation:  Standard. Alignment:  Unchanged mild retrolisthesis of L2. Vertebrae: Again seen is tumor involving L1, L2, and L3 vertebral bodies. Tumor involvement of the L3 vertebral body appears increased. Unchanged severe pathologic L2 compression fracture. Unchanged tumor involving the L2 posterior elements. Anterior extraosseous extension of tumor is grossly unchanged. Conus medullaris and cauda equina: Conus extends to the L1 level. Conus and cauda equina appear normal. No intradural enhancement. Paraspinal and other soft tissues: Right sacral ala and left posterior iliac metastases are stable to slightly decreased in size. Disc levels: T12-L1: Unchanged small left paracentral disc protrusion. No stenosis. L1-L2 and L2-L3: Circumferential epidural tumor, worse on the left, has progressed since the prior study, with worsening moderate to severe spinal canal stenosis and severe left lateral recess stenosis at both levels. Unchanged complete tumor infiltration of the left L1-L2 and L2-L3 neural foramina and moderate infiltration of the right L2-L3 neural foramen. L3-L4:  Negative. L4-L5:  Negative. L5-S1:  Negative. IMPRESSION: Cervical spine: 1. Grossly unchanged  osseous metastatic disease involving the C2 and C3 posterior elements when compared to neck CT from February 2020. Lesions  have significantly decreased in size since MRI cervical spine from June 2019. 2. No progressive metastatic disease. 3. Unchanged mild multilevel cervical spondylosis as described above. No high-grade stenosis. Thoracic spine: 1. No evidence of osseous metastatic disease. 2. Stable radiation changes in the posterior right upper lobe. 3. Slightly increased small right pleural effusion. New trace left pleural effusion. Lumbar spine: 1. Progressive bulky spinal and paraspinal tumor from L1 through L3, with greater involvement of the L3 vertebral body and increased left-sided epidural tumor resulting in progressive moderate to severe spinal canal stenosis and severe left lateral recess stenosis at L1-L2 and L2-L3. 2. Grossly unchanged involvement of the left L1-L2 and bilateral L2-L3 neural foramina. 3. Stable pathologic compression fracture of L2 with mild retropulsion. 4. Right sacral ala and left posterior iliac metastases are stable to slightly decreased in size. Electronically Signed   By: Titus Dubin M.D.   On: 05/07/2019 16:58   Mr Thoracic Spine W Wo Contrast  Result Date: 05/07/2019 CLINICAL DATA:  Urinary retention since 5 a.m. History of metastatic poorly differentiated neoplasm, possibly lung cancer. Currently on chemotherapy. EXAM: MRI CERVICAL, THORACIC AND LUMBAR SPINE WITHOUT AND WITH CONTRAST TECHNIQUE: Multiplanar and multiecho pulse sequences of the cervical spine, to include the craniocervical junction and cervicothoracic junction, and thoracic and lumbar spine, were obtained without and with intravenous contrast. CONTRAST:  5 mL Gadavist intravenous contrast. COMPARISON:  MRI lumbar spine dated March 21, 2019. CT chest, abdomen, and pelvis dated February 19, 2019. CT neck dated October 16, 2018. MRI cervical spine dated February 24, 2018. FINDINGS: MRI CERVICAL SPINE FINDINGS Alignment: New 2 mm anterolisthesis at C2-C3. Vertebrae: Enhancing expansile lesion in the C2 and C3 posterior elements is  similar to CT neck from February, and significantly decreased in size since cervical spine MRI and June 2019. No new suspicious bone lesion. No fracture or evidence of discitis. Cord: Normal signal and morphology.  No intradural enhancement. Posterior Fossa, vertebral arteries, paraspinal tissues: Negative. Disc levels: C2-C3: Negative disc. Mild bilateral facet arthropathy. No stenosis. C3-C4: Unchanged small left paracentral disc protrusion. No stenosis. C4-C5: Unchanged mild disc bulging eccentric to the left and mild bilateral facet uncovertebral hypertrophy. No stenosis. C5-C6: Unchanged mild disc bulging and bilateral facet uncovertebral hypertrophy. Unchanged mild left neuroforaminal stenosis. No spinal canal or right neuroforaminal stenosis. C6-C7:  Unchanged small central disc protrusion.  No stenosis. C7-T1:  Negative. MRI THORACIC SPINE FINDINGS Alignment:  Physiologic. Vertebrae: No fracture, evidence of discitis, or bone lesion. Cord:  Normal signal and morphology.  No intradural enhancement. Paraspinal and other soft tissues: Stable radiation changes in the posterior right upper lobe. Slightly increased small right pleural effusion. New trace left pleural effusion. Disc levels: Small central disc protrusions and minimal disc bulging from T7-T8 through T11-T12. No spinal canal or neuroforaminal stenosis. MRI LUMBAR SPINE FINDINGS Segmentation:  Standard. Alignment:  Unchanged mild retrolisthesis of L2. Vertebrae: Again seen is tumor involving L1, L2, and L3 vertebral bodies. Tumor involvement of the L3 vertebral body appears increased. Unchanged severe pathologic L2 compression fracture. Unchanged tumor involving the L2 posterior elements. Anterior extraosseous extension of tumor is grossly unchanged. Conus medullaris and cauda equina: Conus extends to the L1 level. Conus and cauda equina appear normal. No intradural enhancement. Paraspinal and other soft tissues: Right sacral ala and left posterior  iliac metastases are stable to slightly decreased in size. Disc  levels: T12-L1: Unchanged small left paracentral disc protrusion. No stenosis. L1-L2 and L2-L3: Circumferential epidural tumor, worse on the left, has progressed since the prior study, with worsening moderate to severe spinal canal stenosis and severe left lateral recess stenosis at both levels. Unchanged complete tumor infiltration of the left L1-L2 and L2-L3 neural foramina and moderate infiltration of the right L2-L3 neural foramen. L3-L4:  Negative. L4-L5:  Negative. L5-S1:  Negative. IMPRESSION: Cervical spine: 1. Grossly unchanged osseous metastatic disease involving the C2 and C3 posterior elements when compared to neck CT from February 2020. Lesions have significantly decreased in size since MRI cervical spine from June 2019. 2. No progressive metastatic disease. 3. Unchanged mild multilevel cervical spondylosis as described above. No high-grade stenosis. Thoracic spine: 1. No evidence of osseous metastatic disease. 2. Stable radiation changes in the posterior right upper lobe. 3. Slightly increased small right pleural effusion. New trace left pleural effusion. Lumbar spine: 1. Progressive bulky spinal and paraspinal tumor from L1 through L3, with greater involvement of the L3 vertebral body and increased left-sided epidural tumor resulting in progressive moderate to severe spinal canal stenosis and severe left lateral recess stenosis at L1-L2 and L2-L3. 2. Grossly unchanged involvement of the left L1-L2 and bilateral L2-L3 neural foramina. 3. Stable pathologic compression fracture of L2 with mild retropulsion. 4. Right sacral ala and left posterior iliac metastases are stable to slightly decreased in size. Electronically Signed   By: Titus Dubin M.D.   On: 05/07/2019 16:58   Mr Lumbar Spine W Wo Contrast  Result Date: 05/07/2019 CLINICAL DATA:  Urinary retention since 5 a.m. History of metastatic poorly differentiated neoplasm, possibly  lung cancer. Currently on chemotherapy. EXAM: MRI CERVICAL, THORACIC AND LUMBAR SPINE WITHOUT AND WITH CONTRAST TECHNIQUE: Multiplanar and multiecho pulse sequences of the cervical spine, to include the craniocervical junction and cervicothoracic junction, and thoracic and lumbar spine, were obtained without and with intravenous contrast. CONTRAST:  5 mL Gadavist intravenous contrast. COMPARISON:  MRI lumbar spine dated March 21, 2019. CT chest, abdomen, and pelvis dated February 19, 2019. CT neck dated October 16, 2018. MRI cervical spine dated February 24, 2018. FINDINGS: MRI CERVICAL SPINE FINDINGS Alignment: New 2 mm anterolisthesis at C2-C3. Vertebrae: Enhancing expansile lesion in the C2 and C3 posterior elements is similar to CT neck from February, and significantly decreased in size since cervical spine MRI and June 2019. No new suspicious bone lesion. No fracture or evidence of discitis. Cord: Normal signal and morphology.  No intradural enhancement. Posterior Fossa, vertebral arteries, paraspinal tissues: Negative. Disc levels: C2-C3: Negative disc. Mild bilateral facet arthropathy. No stenosis. C3-C4: Unchanged small left paracentral disc protrusion. No stenosis. C4-C5: Unchanged mild disc bulging eccentric to the left and mild bilateral facet uncovertebral hypertrophy. No stenosis. C5-C6: Unchanged mild disc bulging and bilateral facet uncovertebral hypertrophy. Unchanged mild left neuroforaminal stenosis. No spinal canal or right neuroforaminal stenosis. C6-C7:  Unchanged small central disc protrusion.  No stenosis. C7-T1:  Negative. MRI THORACIC SPINE FINDINGS Alignment:  Physiologic. Vertebrae: No fracture, evidence of discitis, or bone lesion. Cord:  Normal signal and morphology.  No intradural enhancement. Paraspinal and other soft tissues: Stable radiation changes in the posterior right upper lobe. Slightly increased small right pleural effusion. New trace left pleural effusion. Disc levels: Small central  disc protrusions and minimal disc bulging from T7-T8 through T11-T12. No spinal canal or neuroforaminal stenosis. MRI LUMBAR SPINE FINDINGS Segmentation:  Standard. Alignment:  Unchanged mild retrolisthesis of L2. Vertebrae: Again seen is tumor  involving L1, L2, and L3 vertebral bodies. Tumor involvement of the L3 vertebral body appears increased. Unchanged severe pathologic L2 compression fracture. Unchanged tumor involving the L2 posterior elements. Anterior extraosseous extension of tumor is grossly unchanged. Conus medullaris and cauda equina: Conus extends to the L1 level. Conus and cauda equina appear normal. No intradural enhancement. Paraspinal and other soft tissues: Right sacral ala and left posterior iliac metastases are stable to slightly decreased in size. Disc levels: T12-L1: Unchanged small left paracentral disc protrusion. No stenosis. L1-L2 and L2-L3: Circumferential epidural tumor, worse on the left, has progressed since the prior study, with worsening moderate to severe spinal canal stenosis and severe left lateral recess stenosis at both levels. Unchanged complete tumor infiltration of the left L1-L2 and L2-L3 neural foramina and moderate infiltration of the right L2-L3 neural foramen. L3-L4:  Negative. L4-L5:  Negative. L5-S1:  Negative. IMPRESSION: Cervical spine: 1. Grossly unchanged osseous metastatic disease involving the C2 and C3 posterior elements when compared to neck CT from February 2020. Lesions have significantly decreased in size since MRI cervical spine from June 2019. 2. No progressive metastatic disease. 3. Unchanged mild multilevel cervical spondylosis as described above. No high-grade stenosis. Thoracic spine: 1. No evidence of osseous metastatic disease. 2. Stable radiation changes in the posterior right upper lobe. 3. Slightly increased small right pleural effusion. New trace left pleural effusion. Lumbar spine: 1. Progressive bulky spinal and paraspinal tumor from L1 through  L3, with greater involvement of the L3 vertebral body and increased left-sided epidural tumor resulting in progressive moderate to severe spinal canal stenosis and severe left lateral recess stenosis at L1-L2 and L2-L3. 2. Grossly unchanged involvement of the left L1-L2 and bilateral L2-L3 neural foramina. 3. Stable pathologic compression fracture of L2 with mild retropulsion. 4. Right sacral ala and left posterior iliac metastases are stable to slightly decreased in size. Electronically Signed   By: Titus Dubin M.D.   On: 05/07/2019 16:58   Dg C-arm 1-60 Min  Result Date: 05/09/2019 CLINICAL DATA:  Posterior fusion EXAM: DG C-ARM 1-60 MIN; THORACOLUMBAR SPINE - 2 VIEW COMPARISON:  CT 08/08/2019 FINDINGS: Short intraoperative spot images demonstrate fusion across the L2 compression fracture from T12 to L4. No hardware complicating feature. IMPRESSION: Posterior fusion as above.  No visible complicating feature. Electronically Signed   By: Rolm Baptise M.D.   On: 05/09/2019 15:57      Procedures: 05/09/19.  1. L2 laminectomy, bilateral pediculotomy (biltaeral transpedicular approach) for decompression of thecal sac and nerve roots 2. Posterior segmental instrumentation, T12-L4 using alphatec screws 3. Posterolateral arthrodesis, L1-L3 4. Use of morcellized bone allograft   Subjective: Patient is feeling better, but not yet back to her baseline, no nausea or vomiting, no chest pain or dyspnea. Back pain is controlled, positive left lower extremity edema, persistent.   Discharge Exam: Vitals:   05/14/19 0425 05/14/19 0700  BP: 120/70 (!) 141/79  Pulse: 71 69  Resp: 16 17  Temp: 98.3 F (36.8 C) 98.1 F (36.7 C)  SpO2: 99% 100%   Vitals:   05/14/19 0032 05/14/19 0425 05/14/19 0426 05/14/19 0700  BP: 131/68 120/70  (!) 141/79  Pulse: 80 71  69  Resp: 14 16  17   Temp: 98.5 F (36.9 C) 98.3 F (36.8 C)  98.1 F (36.7 C)  TempSrc: Oral   Oral  SpO2: 99% 99%  100%  Weight:   64.5  kg   Height:        General: Not in  pain or dyspnea.  Neurology: Awake and alert, non focal  E ENT: mild pallor, no icterus, oral mucosa moist Cardiovascular: No JVD. S1-S2 present, rhythmic, no gallops, rubs, or murmurs. Left pitting lower extremity edema ++.  Pulmonary: positive breath sounds bilaterally, adequate air movement, no wheezing, rhonchi or rales. Gastrointestinal. Abdomen with  no organomegaly, non tender, no rebound or guarding Skin. No rashes Musculoskeletal: no joint deformities   The results of significant diagnostics from this hospitalization (including imaging, microbiology, ancillary and laboratory) are listed below for reference.     Microbiology: Recent Results (from the past 240 hour(s))  Urine culture     Status: None   Collection Time: 05/07/19 10:05 AM   Specimen: Urine, Clean Catch  Result Value Ref Range Status   Specimen Description   Final    URINE, CLEAN CATCH Performed at [facility], [place] [address], [place], [place] [address]    Special Requests   Final    NONE Performed at [facility], [place] [address] [place], [place] [address]    Culture   Final    NO GROWTH Performed at [facility], [place] [address] [place], [place] [address]    Report Status 05/08/2019 FINAL  Final  SARS CORONAVIRUS 2 (TAT 6-12 HRS) Nasal Swab Aptima Multi Swab     Status: None   Collection Time: 05/07/19  6:54 PM   Specimen: Aptima Multi Swab; Nasal Swab  Result Value Ref Range Status   SARS Coronavirus 2 NEGATIVE NEGATIVE Final    Comment: (NOTE) SARS-CoV-2 target nucleic acids are NOT DETECTED. The SARS-CoV-2 RNA is generally detectable in upper and lower respiratory specimens during the acute phase of infection. Negative results do not preclude SARS-CoV-2 infection, do not rule out co-infections with other pathogens, and should not be used as the sole basis for treatment or other patient management  decisions. Negative results must be combined with clinical observations, patient history, and epidemiological information. The expected result is Negative. Fact Sheet for Patients: SugarRoll.be Fact Sheet for Healthcare Providers: https://www.woods-mathews.com/ This test is not yet approved or cleared by the [place] FDA and  has been authorized for detection and/or diagnosis of SARS-CoV-2 by FDA under an Emergency Use Authorization (EUA). This EUA will remain  in effect (meaning this test can be used) for the duration of the COVID-19 declaration under Section 56 4(b)(1) of the Act, 21 U.S.C. section 360bbb-3(b)(1), unless the authorization is terminated or revoked sooner. Performed at [facility], [place] [address], [place], [place] [address]   MRSA PCR Screening     Status: None   Collection Time: 05/09/19  8:45 AM   Specimen: Nasal Mucosa; Nasopharyngeal  Result Value Ref Range Status   MRSA by PCR NEGATIVE NEGATIVE Final    Comment:        The GeneXpert MRSA Assay (FDA approved for NASAL specimens only), is one component of a comprehensive MRSA colonization surveillance program. It is not intended to diagnose MRSA infection nor to guide or monitor treatment for MRSA infections. Performed at [facility], [place] [address] [place], [place] [address]      Labs: BNP (last 3 results) No results for input(s): BNP in the last 8760 hours. Basic Metabolic Panel: Recent Labs  Lab 05/09/19 1350 05/09/19 1522 05/10/19 0424 05/11/19 0451 05/12/19 0545  NA 140 138 140 139 137  K 4.3 4.8 4.9 4.8 4.8  CL  --   --  107 110  108  CO2  --   --  24 24 23   GLUCOSE  --  133* 115* 119* 118*  BUN  --   --  21 18 17   CREATININE  --   --  0.74 0.57 0.54  CALCIUM  --   --  7.6* 7.3* 7.3*   Liver Function Tests: No results for input(s): AST, ALT, ALKPHOS, BILITOT, PROT, ALBUMIN in the last 168 hours. No results for input(s):  LIPASE, AMYLASE in the last 168 hours. No results for input(s): AMMONIA in the last 168 hours. CBC: Recent Labs  Lab 05/07/19 2219  05/08/19 1752 05/09/19 1350 05/09/19 1522 05/10/19 0424 05/11/19 0451 05/12/19 0545  WBC  --   --  24.1*  --   --  26.0* 27.0* 28.3*  NEUTROABS  --   --   --   --   --   --  25.0* 26.3*  HGB  --    < > 8.2* 6.8* 10.2* 11.3* 9.7* 9.5*  HCT  --    < > 25.4* 20.0* 30.0* 34.2* 29.3* 29.5*  MCV  --   --  97.3  --   --  93.2 94.8 97.4  PLT 68*  --  160  --   --  149* PLATELET CLUMPS NOTED ON SMEAR, UNABLE TO ESTIMATE 129*   < > = values in this interval not displayed.   Cardiac Enzymes: No results for input(s): CKTOTAL, CKMB, CKMBINDEX, TROPONINI in the last 168 hours. BNP: Invalid input(s): POCBNP CBG: Recent Labs  Lab 05/09/19 0821 05/10/19 0607 05/11/19 0742 05/12/19 0633 05/13/19 0619  GLUCAP 124* 107* 101* 118* 99   D-Dimer No results for input(s): DDIMER in the last 72 hours. Hgb A1c No results for input(s): HGBA1C in the last 72 hours. Lipid Profile No results for input(s): CHOL, HDL, LDLCALC, TRIG, CHOLHDL, LDLDIRECT in the last 72 hours. Thyroid function studies No results for input(s): TSH, T4TOTAL, T3FREE, THYROIDAB in the last 72 hours.  Invalid input(s): FREET3 Anemia work up No results for input(s): VITAMINB12, FOLATE, FERRITIN, TIBC, IRON, RETICCTPCT in the last 72 hours. Urinalysis    Component Value Date/Time   COLORURINE YELLOW 05/07/2019 1005   APPEARANCEUR CLEAR 05/07/2019 1005   LABSPEC 1.013 05/07/2019 1005   PHURINE 6.0 05/07/2019 1005   GLUCOSEU NEGATIVE 05/07/2019 1005   HGBUR SMALL (A) 05/07/2019 1005   BILIRUBINUR NEGATIVE 05/07/2019 1005   KETONESUR NEGATIVE 05/07/2019 1005   PROTEINUR 30 (A) 05/07/2019 1005   UROBILINOGEN 0.2 08/09/2010 1026   NITRITE NEGATIVE 05/07/2019 1005   LEUKOCYTESUR NEGATIVE 05/07/2019 1005   Sepsis Labs Invalid input(s): PROCALCITONIN,  WBC,  LACTICIDVEN Microbiology Recent  Results (from the past 240 hour(s))  Urine culture     Status: None   Collection Time: 05/07/19 10:05 AM   Specimen: Urine, Clean Catch  Result Value Ref Range Status   Specimen Description   Final    URINE, CLEAN CATCH Performed at [facility], [place] [address], [place], [place] [address]    Special Requests   Final    NONE Performed at [facility], [place] [address] [place], [place] [address]    Culture   Final    NO GROWTH Performed at [facility], [place] [address], [place],  [address]    Report Status 05/08/2019 FINAL  Final  SARS CORONAVIRUS 2 (TAT 6-12 HRS) Nasal Swab Aptima Multi Swab     Status: None   Collection Time: 05/07/19  6:54  PM   Specimen: Aptima Multi Swab; Nasal Swab  Result Value Ref Range Status   SARS Coronavirus 2 NEGATIVE NEGATIVE Final    Comment: (NOTE) SARS-CoV-2 target nucleic acids are NOT DETECTED. The SARS-CoV-2 RNA is generally detectable in upper and lower respiratory specimens during the acute phase of infection. Negative results do not preclude SARS-CoV-2 infection, do not rule out co-infections with other pathogens, and should not be used as the sole basis for treatment or other patient management decisions. Negative results must be combined with clinical observations, patient history, and epidemiological information. The expected result is Negative. Fact Sheet for Patients: SugarRoll.be Fact Sheet for Healthcare Providers: https://www.woods-mathews.com/ This test is not yet approved or cleared by the [place] FDA and  has been authorized for detection and/or diagnosis of SARS-CoV-2 by FDA under an Emergency Use Authorization (EUA). This EUA will remain  in effect (meaning this test can be used) for the duration of the COVID-19 declaration under Section 56 4(b)(1) of the Act, 21 U.S.C. section 360bbb-3(b)(1), unless the authorization is terminated  or revoked sooner. Performed at [facility], [place] [address] [place], [place] [address]   MRSA PCR Screening     Status: None   Collection Time: 05/09/19  8:45 AM   Specimen: Nasal Mucosa; Nasopharyngeal  Result Value Ref Range Status   MRSA by PCR NEGATIVE NEGATIVE Final    Comment:        The GeneXpert MRSA Assay (FDA approved for NASAL specimens only), is one component of a comprehensive MRSA colonization surveillance program. It is not intended to diagnose MRSA infection nor to guide or monitor treatment for MRSA infections. Performed at [facility], [place] [address], [place], [place] [address]      Time coordinating discharge: 45 minutes  SIGNED:   Tawni Millers, MD  [facility] 05/14/2019, 10:21 AM

## 2019-05-14 NOTE — Progress Notes (Signed)
Lower extremity venous has been completed.   Preliminary results in CV Proc.   Abram Sander 05/14/2019 2:32 PM

## 2019-05-14 NOTE — Progress Notes (Signed)
Rebecca Staggers, MD  Physician  Physical Medicine and Rehabilitation  PMR Pre-admission  Signed  Date of Service:  05/14/2019 10:04 AM      Related encounter: ED to Hosp-Admission (Discharged) from 05/07/2019 in [facility]      Signed         Show:Clear all '[x]' Manual'[x]' Template'[x]' Copied  Added by: '[x]' Cristina Gong, RN'[x]' Rebecca Staggers, MD  '[]' Hover for details PMR Admission Coordinator Pre-Admission Assessment  Patient: Rebecca Lucas is an 63 y.o., female MRN: [id number] DOB: 12/22/1955 Height: '5\' 7"'  (170.2 cm) Weight: 64.5 kg  Insurance Information HMO:     PPO: yes     PCP:      IPA:      80/20:      OTHER:  PRIMARYJanit Bern      Policy#: [id number]      Subscriber: spouse CM Name: [place]    Phone#: [phone] ext [phone]     Fax#: [phone] Pre-Cert#: [id number] approved until 9/8 when updates are due      Employer: [employer] occupational Health Benefits:  Phone #: [phone]     Name: 8/31 Eff. Date: 09/12/2018     Deduct: $2800      Out of Pocket Max: $8000 includes deductible      Life Max: none CIR: 80%      SNF: 80% 120 days per year Outpatient: 80%     Co-Pay: per medical neccesity; after 25 visits medical neccesity is reviewed Home Health: 80%      Co-Pay: visits limited to medical neccesity DME: 80%     Co-Pay: 20% Providers: in network  SECONDARY: none      Medicaid Application Date:       Case Manager:  Disability Application Date:       Case Worker:   The "Data Collection Information Summary" for patients in Inpatient Rehabilitation Facilities with attached "[facility] Records" was provided and verbally reviewed with: N/A  Emergency Contact Information         Contact Information    Name Relation Home Work Mobile   [place] Spouse [phone]      Current Medical History  Patient Admitting Diagnosis: Metastatic neoplasm  History  of Present Illness:63 year old right-handed female with history significant for 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 She had been on oral dexamethasone which had been weaned off. COVID negative, urine culture no growth, alkaline phosphatase 200, WBC 14,700. X-ray and imaging revealed progressed and bulky spinal and paraspinal tumor L1-L3 encompassing 78 x 62 x 67 mm. Subtotal involvement of the L2 vertebrae increased as compared to prior tracings. Circumferential epidural tumor substantially increased resulting in moderate to severe malalignment spinal and severe left lateral recess stenosis. Epidural tumor completely infiltrates the left L1 and L2 neural foramina partial erosion of the anterior endplates of L1 and L3 since April. Stable pathologic compression fractures of L2 with mild retropulsion. Visible right sacral and left iliac bone metastasis appears stable. Underwent L2 laminectomy, bilateral pediculotomyfor decompression of thecal sac and nerve roots with posterior segmental instrumentation T12-L4 and posterolateral arthrodesis L1-L3 05/09/2019 per Dr. Kathyrn Sheriff. Hospital course pain  management. Back brace when out of bed applied in sitting position. Acute blood loss anemia 6.8 patient was transfused latest hemoglobin 9.7. Reactive leukocytosis due to systemic steroids and monitored with latest WBC 28,300. Hematology oncology follow-up with Dr. Earlie Server and await plan for chemotherapy.   Patient's medical record from [facility] has been reviewed by the rehabilitation admission coordinator and physician.  Past  Medical History      Past Medical History:  Diagnosis Date  . Cancer of upper lobe of right lung [facility])     Family History   family history includes ALS (age of onset: 65) in her mother.  Prior Rehab/Hospitalizations Has the patient had prior rehab or hospitalizations prior to admission? Yes  Has the patient had major surgery during 100 days prior to admission? Yes             Current Medications  Current Facility-Administered Medications:  .  0.9 %  sodium chloride infusion, , Intravenous, Continuous, Costella, Vista Mink, PA-C, Stopped at 05/10/19 1033 .  0.9 %  sodium chloride infusion, 250 mL, Intravenous, Continuous, Costella, Vincent J, PA-C .  acetaminophen (TYLENOL) tablet 650 mg, 650 mg, Oral, Q6H, Arrien, Jimmy Picket, MD, 650 mg at 05/12/19 1230 .  dexamethasone (DECADRON) injection 6 mg, 6 mg, Intravenous, Q6H, Nanavati, Ankit, MD, 6 mg at 05/14/19 1358 .  fentaNYL (DURAGESIC) 75 MCG/HR 1 patch, 1 patch, Transdermal, Q72H, Arrien, Jimmy Picket, MD, 1 patch at 05/12/19 2009 .  gabapentin (NEURONTIN) capsule 300 mg, 300 mg, Oral, TID, Zada Finders R, MD, 300 mg at 05/14/19 0955 .  heparin lock flush 100 unit/mL, 500 Units, Intracatheter, Q30 days **AND** heparin lock flush 100 unit/mL, 500 Units, Intracatheter, PRN, Arrien, Jimmy Picket, MD, 500 Units at 05/09/19 1034 .  ibuprofen (ADVIL) tablet 400 mg, 400 mg, Oral, TID, Arrien, Jimmy Picket, MD, 400 mg at 05/11/19 0925 .  lactated ringers infusion, , Intravenous, Continuous, Oleta Mouse, MD, Stopped at 05/13/19 0800 .  linaclotide (LINZESS) capsule 72 mcg, 72 mcg, Oral, QAC breakfast, Lenore Cordia, MD, 72 mcg at 05/14/19 0954 .  menthol-cetylpyridinium (CEPACOL) lozenge 3 mg, 1 lozenge, Oral, PRN **OR** phenol (CHLORASEPTIC) mouth spray 1 spray, 1 spray, Mouth/Throat, PRN, Costella, Vincent J, PA-C .  morphine 2 MG/ML injection 1-2 mg, 1-2 mg, Intravenous, Q4H PRN, Zada Finders R, MD, 2 mg at  05/10/19 2039 .  ondansetron (ZOFRAN) tablet 4 mg, 4 mg, Oral, Q6H PRN **OR** ondansetron (ZOFRAN) injection 4 mg, 4 mg, Intravenous, Q6H PRN, Lenore Cordia, MD, 4 mg at 05/09/19 1456 .  oxyCODONE (Oxy IR/ROXICODONE) immediate release tablet 10 mg, 10 mg, Oral, Q4H PRN, Arrien, Jimmy Picket, MD, 10 mg at 05/14/19 0954 .  pantoprazole (PROTONIX) EC tablet 40 mg, 40 mg, Oral, Daily, Zada Finders R, MD, 40 mg at 05/14/19 0955 .  polyethylene glycol (MIRALAX / GLYCOLAX) packet 17 g, 17 g, Oral, BID, Arrien, Jimmy Picket, MD, 17 g at 05/13/19 0901 .  senna-docusate (Senokot-S) tablet 1 tablet, 1 tablet, Oral, QHS PRN, Posey Pronto, Vishal R, MD .  sodium chloride flush (NS) 0.9 % injection 10-40 mL, 10-40 mL, Intracatheter, PRN, Arrien, Jimmy Picket, MD .  sodium chloride flush (NS) 0.9 % injection 10-40 mL, 10-40 mL, Intracatheter, Q12H, Arrien, Jimmy Picket, MD, 10 mL at 05/14/19 0956 .  sodium chloride flush (NS) 0.9 % injection 3 mL, 3 mL, Intravenous, Q12H, Zada Finders R, MD, 3 mL at 05/13/19 0835 .  sodium chloride flush (NS) 0.9 % injection  3 mL, 3 mL, Intravenous, Q12H, Costella, Vincent J, PA-C, 3 mL at 05/13/19 0834 .  sodium chloride flush (NS) 0.9 % injection 3 mL, 3 mL, Intravenous, PRN, Costella, Vincent J, PA-C .  zolpidem (AMBIEN) tablet 5 mg, 5 mg, Oral, QHS PRN, Lenore Cordia, MD, 5 mg at 05/11/19 2147  Patients Current Diet:     Diet Order                  Diet - low sodium heart healthy         Diet regular Room service appropriate? Yes; Fluid consistency: Thin  Diet effective now               Precautions / Restrictions Precautions Precautions: Fall, Back Precaution Booklet Issued: No Precaution Comments: educated patient on back precations and log rolling-pt. also stated 3/3 precautions Spinal Brace: Lumbar corset, Applied in sitting position Restrictions Weight Bearing Restrictions: No   Has the patient had 2 or more falls or a fall with  injury in the past year? No  Prior Activity Level Limited Community (1-2x/wk): gradual decline over past few weeks with decline in funciton; using wheelchair  Prior Functional Level Self Care: Did the patient need help bathing, dressing, using the toilet or eating? Needed some help  Indoor Mobility: Did the patient need assistance with walking from room to room (with or without device)? Needed some help  Stairs: Did the patient need assistance with internal or external stairs (with or without device)? Needed some help  Functional Cognition: Did the patient need help planning regular tasks such as shopping or remembering to take medications? [place] Devices/Equipment: Environmental consultant (specify type), Wheelchair Home Equipment: Walker - 2 wheels, Wheelchair - manual  Prior Device Use: Indicate devices/aids used by the patient prior to current illness, exacerbation or injury? Manual wheelchair  Current Functional Level Cognition  Overall Cognitive Status: Within Functional Limits for tasks assessed Orientation Level: Oriented X4    Extremity Assessment (includes Sensation/Coordination)  Upper Extremity Assessment: Generalized weakness  Lower Extremity Assessment: Defer to PT evaluation LLE Deficits / Details: R LE WNL. L LE weak/painful and requires assistance to move. LLE: Unable to fully assess due to pain LLE Sensation: WNL LLE Coordination: decreased gross motor, decreased fine motor    ADLs  Overall ADL's : Needs assistance/impaired Eating/Feeding: Set up, Sitting Grooming: Wash/dry face, Wash/dry hands, Minimal assistance, Standing Grooming Details (indicate cue type and reason): Engaged in grooming tasks in standing this session with focus on increased standing balance and activity tolerance.  Pt alternating UE support on sink as continues to demonstrate instability and fearfulness in standing. Upper Body Bathing:  Minimal assistance, Sitting Lower Body Bathing: Maximal assistance, Total assistance, Sitting/lateral leans, Sit to/from stand, Bed level Upper Body Dressing : Minimal assistance, Sitting, Cueing for sequencing Upper Body Dressing Details (indicate cue type and reason): cues for sequencing of straps and velcros of brace Lower Body Dressing: Maximal assistance, Sitting/lateral leans, Sit to/from stand Toilet Transfer: Minimal assistance, +2 for physical assistance, +2 for safety/equipment, Stand-pivot, BSC Toileting- Clothing Manipulation and Hygiene: +2 for physical assistance, +2 for safety/equipment, Sitting/lateral lean, Sit to/from stand, Maximal assistance Functional mobility during ADLs: Minimal assistance, +2 for physical assistance, +2 for safety/equipment, Rolling walker, Moderate assistance General ADL Comments: Mod assist sit > stand but then min assist once upright.  did require +2 for safety due to instability and increased challenge in standing    Mobility  Overal bed mobility: Needs Assistance Bed Mobility: Sit to Sidelying Rolling: Mod assist Sidelying to sit: Mod assist Sit to sidelying: Min assist General bed mobility comments: cues for sequencing; assist to bring L LE into bed; pt positioned in side lying     Transfers  Overall transfer level: Needs assistance Equipment used: Rolling walker (2 wheeled) Transfers: Sit to/from Stand Sit to Stand: Mod assist General transfer comment: cues for safe hand placement; required lifting assistance from [facility] and arm chair    Ambulation / Gait / Stairs / Wheelchair Mobility  Ambulation/Gait Ambulation/Gait assistance: Herbalist (Feet): 10 Feet Assistive device: Rolling walker (2 wheeled) Gait Pattern/deviations: Step-to pattern, Decreased step length - left, Decreased dorsiflexion - left, Trunk flexed General Gait Details: multimodal cues for L hip/knee flexion; pt with tendency to swing L LE forward from hip  and unable to flex knee during swing phase; AFO donned  Gait velocity: decreased    Posture / Balance Balance Overall balance assessment: Needs assistance Sitting-balance support: Single extremity supported, Feet supported Sitting balance-Leahy Scale: Fair Standing balance support: Bilateral upper extremity supported, During functional activity Standing balance-Leahy Scale: Poor    Special needs/care consideration BiPAP/CPAP  N/a CPM  N/a Continuous Drip IV  N/a; has porto cath Dialysis n/a Life Vest  N/a Oxygen  N/a Special Bed  N/a Trach Size  N/a Wound Vac n/a Skin surgical incision with dressing; abrasion left lower leg Bowel mgmt:  Continent LBM 9/1 Bladder mgmt: continent Diabetic mgmt: n/a Behavioral consideration  N/a Chemo/radiation  Yes Visitor designee spouse, Marcello Moores   Previous Home Environment  Living Arrangements: Spouse/significant other  Lives With: Spouse Available Help at Discharge: Family, Available 24 hours/day(brother, sister in Sports coach, father and friends when spouse works) Type of Home: House Home Layout: One level Home Access: Level entry Bathroom Shower/Tub: Tub/shower unit, Multimedia programmer: Handicapped height Bathroom Accessibility: Yes How Accessible: Accessible via walker, Accessible via wheelchair [place]: No  Discharge Living Setting Plans for Discharge Living Setting: Patient's home, Lives with (comment)(spouse) Type of Home at Discharge: House Discharge Home Layout: One level Discharge Home Access: Level entry Discharge Bathroom Shower/Tub: Tub/shower unit, Walk-in shower Discharge Bathroom Toilet: Handicapped height Discharge Bathroom Accessibility: Yes How Accessible: Accessible via wheelchair, Accessible via walker Does the patient have any problems obtaining your medications?: No  Social/Family/Support Systems Patient Roles: Spouse, Parent Contact Information: spouse, Marcello Moores Anticipated Caregiver:  spouse, brother and sister in law, father; daughter just had her second child on 8/31 Anticipated Caregiver's Contact Information: see above Ability/Limitations of Caregiver: spouse works for [employer] occupational health Caregiver Availability: 24/7 Discharge Plan Discussed with Primary Caregiver: Yes Is Caregiver In Agreement with Plan?: Yes Does Caregiver/Family have Issues with Lodging/Transportation while Pt is in Rehab?: No  Goals/Additional Needs Patient/Family Goal for Rehab: supervision PT, superivsion to min OT Expected length of stay: ELOS 10 to 14 days Special Service Needs: Was recieving chemo q 3rd week with Dr. Neil Crouch. Next chemo was to be 05/16/2019 Pt/Family Agrees to Admission and willing to participate: Yes Program Orientation Provided & Reviewed with Pt/Caregiver Including Roles  & Responsibilities: Yes  Decrease burden of Care through IP rehab admission: n/a  Possible need for SNF placement upon discharge:  Not anticipated  Patient Condition: I have reviewed medical records from [facility] , spoken with CM, and patient and spouse. I met with patient at the bedside for inpatient rehabilitation assessment.  Patient will benefit from ongoing PT and OT, can actively  participate in 3 hours of therapy a day 5 days of the week, and can make measurable gains during the admission.  Patient will also benefit from the coordinated team approach during an Inpatient Acute Rehabilitation admission.  The patient will receive intensive therapy as well as Rehabilitation physician, nursing, social worker, and care management interventions.  Due to bladder management, bowel management, safety, skin/wound care, disease management, medication administration, pain management and patient education the patient requires 24 hour a day rehabilitation nursing.  The patient is currently min to mod assist with mobility and basic ADLs.  Discharge setting and therapy post discharge at home with home  health is anticipated.  Patient has agreed to participate in the Acute Inpatient Rehabilitation Program and will admit today.  Preadmission Screen Completed By:  Cleatrice Burke, 05/14/2019 2:46 PM ______________________________________________________________________   Discussed status with Dr. Naaman Plummer  on  05/14/2019 at  1450 and received approval for admission today.  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 Does the need for close, 24 hr/day Medical supervision in concert with the patient's rehab needs make it unreasonable for this patient to be served in a less intensive setting? Yes 2. Co-Morbidities requiring supervision/potential complications: lung ca,  3. Due to bladder management, bowel management, safety, skin/wound care, disease management, medication administration, pain management and patient education, does the patient require 24 hr/day rehab nursing? Yes 4. Does the patient require coordinated care of a physician, rehab nurse, PT (1-2 hrs/day, 5 days/week) and OT (1-2 hrs/day, 5 days/week) to address physical and functional deficits in the context of the above medical diagnosis(es)? Yes Addressing deficits in the following areas: balance, endurance, locomotion, strength, transferring, bowel/bladder control, bathing, dressing, feeding, grooming, toileting and psychosocial support 5. Can the patient actively participate in an intensive therapy program of at least 3 hrs of therapy 5 days a week? Yes 6. The potential for patient to make measurable gains while on inpatient rehab is excellent 7. Anticipated functional outcomes upon discharge from inpatients are: supervision PT, supervision and min assist OT, n/a SLP 8. Estimated rehab length of stay to reach the above functional goals is: 10-14 days 9. Anticipated D/C setting: Home 10. Anticipated post D/C treatments:  [facility] therapy 11. Overall Rehab/Functional Prognosis: excellent  MD Signature: Rebecca Staggers, MD, [facility] 05/14/2019         Revision History

## 2019-05-14 NOTE — Progress Notes (Signed)
Occupational Therapy Treatment Patient Details Name: Rebecca Lucas MRN: [id number] DOB: 07-May-1956 Today's Date: 05/14/2019    History of present illness Patient is a 63 year old female with malignant neoplasm to epidural space. S/P lumbar fusion/descompression, posterior segmental instrumentation T12-L4 with screws, posterolateral arthrodesis L1-L3, allograft. Came to hospital due to urinary retention. Patient has h/o radiation and chemotherapy.   OT comments  Treatment session with focus on standing balance and increased activity tolerance.  Pt required mod assist sit > stand from [facility] and arm chair with cues for hand placement and sequencing.  Engaged in grooming tasks in standing to challenge standing balance and endurance.  Pt fearful in standing, but able to support self on LUE while washing face with RUE as pt reports too unsteady to alternate or use BUE.  Pt will continue to benefit from OT services to increase independence/decrease burden of care with self-care tasks and functional mobility.  Follow Up Recommendations  CIR    Equipment Recommendations  3 in 1 bedside commode       Precautions / Restrictions Precautions Precautions: Fall;Back Required Braces or Orthoses: Spinal Brace Spinal Brace: Lumbar corset;Applied in sitting position Restrictions Weight Bearing Restrictions: No       Mobility Bed Mobility  Up with PT on arrival                Transfers     Transfers: Sit to/from Stand Sit to Stand: Mod assist         General transfer comment: cues for safe hand placement; required lifting assistance from [facility] and arm chair        ADL either performed or assessed with clinical judgement   ADL Overall ADL's : Needs assistance/impaired     Grooming: Wash/dry face;Wash/dry hands;Minimal assistance;Standing Grooming Details (indicate cue type and reason): Engaged in grooming tasks in standing this session with focus on increased standing balance and  activity tolerance.  Pt alternating UE support on sink as continues to demonstrate instability and fearfulness in standing.             Lower Body Dressing: Maximal assistance;Sitting/lateral leans;Sit to/from stand   Toilet Transfer: Minimal assistance;+2 for physical assistance;+2 for safety/equipment;Stand-pivot;BSC   Toileting- Clothing Manipulation and Hygiene: +2 for physical assistance;+2 for safety/equipment;Sitting/lateral lean;Sit to/from stand;Maximal assistance       Functional mobility during ADLs: Minimal assistance;+2 for physical assistance;+2 for safety/equipment;Rolling walker;Moderate assistance General ADL Comments: Mod assist sit > stand but then min assist once upright.  did require +2 for safety due to instability and increased challenge in standing     Vision Baseline Vision/History: No visual deficits Vision Assessment?: No apparent visual deficits          Cognition   Behavior During Therapy: WFL for tasks assessed/performed Overall Cognitive Status: Within Functional Limits for tasks assessed                                                     Pertinent Vitals/ Pain       Pain Assessment: 0-10 Pain Score: 5  Pain Location: back Pain Descriptors / Indicators: Guarding;Sore Pain Intervention(s): Limited activity within patient's tolerance;Monitored during session;Repositioned;Premedicated before session  Home Living     Available Help at Discharge: Family;Available 24 hours/day(brother, sister in law, father and friends when spouse works) Type of Home: [employer]  Home Access: Level entry     Home Layout: One level     Bathroom Shower/Tub: Tub/shower unit;Walk-in shower   Bathroom Toilet: Handicapped height Bathroom Accessibility: Yes How Accessible: Accessible via walker;Accessible via wheelchair        Lives With: Spouse        Frequency  Min 2X/week        Progress Toward Goals  OT Goals(current goals  can now be found in the care plan section)  Progress towards OT goals: Progressing toward goals  Acute Rehab OT Goals Patient Stated Goal: to decrease pain, get stronger OT Goal Formulation: With patient Time For Goal Achievement: 05/24/19 Potential to Achieve Goals: Good  Plan Discharge plan remains appropriate    Co-evaluation      Reason for Co-Treatment: Complexity of the patient's impairments (multi-system involvement);For patient/therapist safety   OT goals addressed during session: ADL's and self-care      AM-PAC OT "6 Clicks" Daily Activity     Outcome Measure   Help from another person eating meals?: None Help from another person taking care of personal grooming?: A Little Help from another person toileting, which includes using toliet, bedpan, or urinal?: A Lot Help from another person bathing (including washing, rinsing, drying)?: A Lot Help from another person to put on and taking off regular upper body clothing?: A Little Help from another person to put on and taking off regular lower body clothing?: A Lot 6 Click Score: 16    End of Session Equipment Utilized During Treatment: Back brace;Gait belt;Rolling walker  OT Visit Diagnosis: Unsteadiness on feet (R26.81);Muscle weakness (generalized) (M62.81);Pain Pain - part of body: (back)   Activity Tolerance Patient tolerated treatment well   Patient Left in chair;with call bell/phone within reach;with chair alarm set   Nurse Communication Mobility status      Time: 7062-3762 OT Time Calculation (min): 25 min  Charges: OT General Charges $OT Visit: 1 Visit OT Treatments $Self Care/Home Management : 8-22 mins     Simonne Come, [phone] 05/14/2019, 1:11 PM

## 2019-05-14 NOTE — H&P (Signed)
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  Presented 05/07/2019 with urinary retention x4 to 5 days as well as weakness and numbness in her left lower extremity but was able to ambulate with a walker for short distances and later using a wheelchair.  She had been on oral dexamethasone which had been weaned off.  COVID negative, urine culture no growth, alkaline phosphatase 200, WBC 14,700.  X-ray and imaging revealed progressed and bulky spinal and paraspinal tumor L1-L3 encompassing 78 x 62 x 67 mm.  Subtotal involvement of the L2 vertebrae increased as compared to prior tracings.  Circumferential epidural tumor substantially increased resulting in moderate to severe malalignment spinal and severe left lateral recess stenosis.  Epidural tumor completely infiltrates the left L1 and L2 neural foramina partial erosion of the anterior endplates of L1 and L3 since April.  Stable pathologic compression fractures of L2 with mild retropulsion.  Visible right sacral and left iliac bone metastasis appears stable.  Underwent L2 laminectomy, bilateral pediculotomy for decompression of thecal sac and nerve roots with posterior segmental instrumentation T12-L4 and posterolateral arthrodesis L1-L3 05/09/2019 per Dr. Kathyrn Sheriff.  Hospital course pain management.  Back brace when out of bed applied in sitting position.  Acute blood loss anemia 6.8 patient was transfused latest hemoglobin 9.7.  Reactive leukocytosis due to systemic steroids and monitored with latest WBC  28,300.  Hematology oncology follow-up with Dr. Earlie Server and await plan for chemotherapy.  Therapy evaluations completed and patient was admitted for a comprehensive rehab program.  Review of Systems  Constitutional: Negative for fever.  HENT: Negative for hearing loss.   Eyes: Negative for blurred vision and double vision.  Respiratory: Negative for cough.        Shortness of breath with heavy exertion  Cardiovascular: Positive for leg swelling. Negative for chest pain and palpitations.  Gastrointestinal: Positive for constipation. Negative for heartburn, nausea and vomiting.  Genitourinary: Negative for dysuria, flank pain and hematuria.       Urinary retention  Musculoskeletal: Positive for back pain, joint pain and myalgias.  Skin: Negative for rash.  Neurological: Positive for sensory change and weakness.  All other systems reviewed and are negative.  Past Medical History:  Diagnosis Date   Cancer of upper lobe of right lung [facility])    Past Surgical History:  Procedure Laterality Date   APPLICATION OF ROBOTIC ASSISTANCE FOR SPINAL PROCEDURE N/A 05/09/2019   Procedure: APPLICATION OF ROBOTIC ASSISTANCE FOR SPINAL PROCEDURE;  Surgeon: Consuella Lose, MD;  Location: [place];  Service: Neurosurgery;  Laterality: N/A;  APPLICATION OF ROBOTIC ASSISTANCE FOR SPINAL PROCEDURE   IR IMAGING GUIDED PORT INSERTION  06/26/2018   POSTERIOR LUMBAR FUSION 4 LEVEL N/A 05/09/2019   Procedure: Lumbar Two Laminectomy; Thoracic Twelve-Lumbar Four instrumented stabilization;  Surgeon: Consuella Lose, MD;  Location: [place];  Service: Neurosurgery;  Laterality: N/A;  Lumbar Two Laminectomy; Thoracic Twelve-Lumbar Four instrumented stabilization   Family History  Problem Relation Age of Onset   ALS Mother 72       non genetic   Cancer Neg Hx  Social History:  reports that she has never smoked. She has never used smokeless tobacco. She reports current alcohol use. She reports that she does not  use drugs. Allergies: No Known Allergies Medications Prior to Admission  Medication Sig Dispense Refill   acetaminophen (TYLENOL) 500 MG tablet Take 1,000 mg by mouth every 6 (six) hours as needed for mild pain.      butalbital-aspirin-caffeine (FIORINAL) 50-325-40 MG capsule Take 1 capsule by mouth every 6 (six) hours as needed for migraine.   1   Calcium Carbonate-Vitamin D (CALCIUM-D PO) Take 1 tablet by mouth 2 (two) times daily.     dexamethasone (DECADRON) 4 MG tablet Take 2 tablets twice a day (4 tablets total per day) the day before, the day of, and the day after chemotherapy 40 tablet 2   fentaNYL (DURAGESIC) 75 MCG/HR Place 1 patch onto the skin every 3 (three) days for 15 days. 5 patch 0   gabapentin (NEURONTIN) 300 MG capsule Take 1 capsule (300 mg total) by mouth 3 (three) times daily. 90 capsule 0   ibuprofen (ADVIL,MOTRIN) 200 MG tablet Take 600 mg by mouth every 6 (six) hours as needed for mild pain.      lidocaine-prilocaine (EMLA) cream Apply 1 application topically as needed. (Patient taking differently: Apply 1 application topically as needed (pain). ) 30 g 0   linaclotide (LINZESS) 72 MCG capsule TAKE 1 CAPSULE BY MOUTH ONCE DAILY BEFORE BREAKFAST (Patient taking differently: Take 72 mcg by mouth daily before breakfast. ) 30 capsule 0   LUTEIN PO Take 1 tablet by mouth daily.     omeprazole (PRILOSEC) 20 MG capsule TAKE 1 CAPSULE (20 MG TOTAL) BY MOUTH DAILY. 90 capsule 1   ondansetron (ZOFRAN) 8 MG tablet TAKE 1 TABLET (8 MG TOTAL) BY MOUTH EVERY 8 HOURS AS NEEDED FOR NAUSEA OR VOMITING. (Patient taking differently: Take 8 mg by mouth every 8 (eight) hours as needed for nausea or vomiting. ) 20 tablet 0   oxyCODONE (OXY IR/ROXICODONE) 5 MG immediate release tablet Take 1 tablet (5 mg total) by mouth every 6 (six) hours as needed for severe pain. 60 tablet 0   traMADol (ULTRAM) 50 MG tablet Take 1 tablet (50 mg total) by mouth every 6 (six) hours as needed for  moderate pain. 120 tablet 0   zolpidem (AMBIEN) 10 MG tablet Take 1 tablet (10 mg total) by mouth at bedtime as needed for sleep. 30 tablet 0    Drug Regimen Review Drug regimen was reviewed and remains appropriate with no significant issues identified  Home: Home Living Family/patient expects to be discharged to:: Private residence Living Arrangements: Spouse/significant other Available Help at Discharge: Family, Available PRN/intermittently Home Equipment: Walker - 2 wheels, Wheelchair - manual   Functional History: Prior Function Level of Independence: Needs assistance Gait / Transfers Assistance Needed: prior to admission patient reports she was unable to walk due to left LE weakness. She was able to transfer to wheelchair with assist. ADL's / Homemaking Assistance Needed: assistance needed Communication / Swallowing Assistance Needed: no assist needed  Functional Status:  Mobility: Bed Mobility Overal bed mobility: Needs Assistance Bed Mobility: Sit to Sidelying Rolling: Mod assist Sidelying to sit: Mod assist Sit to sidelying: Min assist General bed mobility comments: cues for sequencing; assist to bring L LE into bed; pt positioned in side lying  Transfers Overall transfer level: Needs assistance Equipment used: Rolling walker (2 wheeled) Transfers: Sit to/from Stand Sit to Stand: Mod assist General transfer  comment: cues for safe hand placement; pt stood from [facility] with assist to power up into standing  Ambulation/Gait Ambulation/Gait assistance: Min assist Gait Distance (Feet): 10 Feet Assistive device: Rolling walker (2 wheeled) Gait Pattern/deviations: Step-to pattern, Decreased step length - left, Decreased dorsiflexion - left, Trunk flexed General Gait Details: multimodal cues for L hip/knee flexion; pt with tendency to swing L LE forward from hip and unable to flex knee during swing phase; AFO donned  Gait velocity: decreased    ADL: ADL Overall ADL's :  Needs assistance/impaired Eating/Feeding: Set up, Sitting Grooming: Wash/dry face, Oral care, Brushing hair, Min guard, Sitting, Cueing for compensatory techniques Grooming Details (indicate cue type and reason): pt. reports only feeling comfortable at this time having UE support while completing tasks.  would keep mostly RUE support on bed while using LUE to comb hair, brush teeth, wash face Upper Body Bathing: Minimal assistance, Sitting Lower Body Bathing: Maximal assistance, Total assistance, Sitting/lateral leans, Sit to/from stand, Bed level Upper Body Dressing : Minimal assistance, Sitting, Cueing for sequencing Upper Body Dressing Details (indicate cue type and reason): cues for sequencing of straps and velcros of brace Lower Body Dressing: Maximal assistance, Sitting/lateral leans, Sit to/from stand Toilet Transfer: Minimal assistance, +2 for physical assistance, +2 for safety/equipment, Stand-pivot, BSC Toileting- Clothing Manipulation and Hygiene: Moderate assistance, +2 for physical assistance, +2 for safety/equipment, Sitting/lateral lean, Sit to/from stand Functional mobility during ADLs: Minimal assistance, +2 for physical assistance, +2 for safety/equipment, Rolling walker General ADL Comments: Pt. progressing.  states "i have to get more confident" in regards to sitting eob and challenging balance.  relies heavy on ue support due to pain and also not feeling steady  Cognition: Cognition Overall Cognitive Status: Within Functional Limits for tasks assessed Orientation Level: Oriented X4 Cognition Arousal/Alertness: Awake/alert Behavior During Therapy: WFL for tasks assessed/performed Overall Cognitive Status: Within Functional Limits for tasks assessed  Physical Exam: Blood pressure 120/70, pulse 71, temperature 98.3 F (36.8 C), resp. rate 16, height 5\' 7"  (1.702 m), weight 64.5 kg, SpO2 99 %. Physical Exam  Constitutional: She is oriented to person, place, and time. She  appears well-developed and well-nourished.  HENT:  Round facies  Eyes: Pupils are equal, round, and reactive to light. EOM are normal.  Neck: Normal range of motion. No tracheal deviation present. No thyromegaly present.  Cardiovascular: Normal rate and regular rhythm. Exam reveals no friction rub.  No murmur heard. Respiratory: Effort normal. No respiratory distress. She has no wheezes.  GI: Soft. She exhibits no distension. There is no abdominal tenderness.  Musculoskeletal: Normal range of motion.  Neurological: She is alert and oriented to person, place, and time. She has normal reflexes. No cranial nerve deficit. Coordination normal.  UE 5/5. RLE: 4-/5 to 4/5 prox to distal. LLE: HF,KE trace to 0/5, ADF tr/5, APF 1+/5. Sensation decreased to LT, pain and proprioception from left inguinal area to toes. DTR's absent.  Skin: No rash noted. No erythema.  Psychiatric: She has a normal mood and affect. Her behavior is normal. Thought content normal.    Results for orders placed or performed during the hospital encounter of 05/07/19 (from the past 48 hour(s))  Glucose, capillary     Status: None   Collection Time: 05/13/19  6:19 AM  Result Value Ref Range   Glucose-Capillary 99 70 - 99 mg/dL   No results found.     Medical Problem List and Plan: 1.  Decreased functional mobility secondary to metastatic poorly differentiated malignant neoplasm  of unknown primary with spinal tumor involvement L1-L3/cauda equina syndrome.S/P L2 laminectomy, bilateral pediculotomy decompression of thecal sac and nerve roots with posterior segmental instrumentation T12-L4 and arthrodesis L1-3 05/09/2019.    -admit to inpatient rehab.   -Back brace when out of bed.   -Plan is to follow-up outpatient Dr. Earlie Server in regards to chemotherapy 2.  Antithrombotics: -DVT/anticoagulation: SCDs. LE venous dopplers performed today and are negative  -add lovenox 40mg  qd as pt is high risk for thrombus  formation  -antiplatelet therapy: N/A 3. Pain Management: Neurontin 300 mg 3 times daily, Duragesic patch 75 mcg, Advil 400 mg 3 times daily oxycodone as needed 4. Mood: Vita motional support  -antipsychotic agents: N/A 5. Neuropsych: This patient is capable of making decisions on her own behalf. 6. Skin/Wound Care: Routine skin checks  -honeycomb dressing in place currently.  7. Fluids/Electrolytes/Nutrition: Routine in and outs with follow-up chemistries upon admit 8.  Acute blood loss anemia.  Follow-up CBC 9.  Leukocytosis secondary to steroids.  Continue to monitor 10.  Urinary retention.  Check PVR 11.  Constipation.  Linzess daily.  Adjust bowel program as needed       Cathlyn Parsons, PA-C 05/14/2019

## 2019-05-15 ENCOUNTER — Inpatient Hospital Stay (HOSPITAL_COMMUNITY): Payer: 59 | Admitting: Physical Therapy

## 2019-05-15 ENCOUNTER — Telehealth: Payer: Self-pay | Admitting: Medical Oncology

## 2019-05-15 ENCOUNTER — Inpatient Hospital Stay (HOSPITAL_COMMUNITY): Payer: 59 | Admitting: Occupational Therapy

## 2019-05-15 DIAGNOSIS — G834 Cauda equina syndrome: Secondary | ICD-10-CM

## 2019-05-15 DIAGNOSIS — M21372 Foot drop, left foot: Secondary | ICD-10-CM | POA: Diagnosis present

## 2019-05-15 DIAGNOSIS — K592 Neurogenic bowel, not elsewhere classified: Secondary | ICD-10-CM | POA: Diagnosis present

## 2019-05-15 LAB — CBC WITH DIFFERENTIAL/PLATELET
Band Neutrophils: 0 %
Basophils Absolute: 0.6 10*3/uL — ABNORMAL HIGH (ref 0.0–0.1)
Basophils Relative: 2 %
Blasts: 0 %
Eosinophils Absolute: 0 10*3/uL (ref 0.0–0.5)
Eosinophils Relative: 0 %
HCT: 34.2 % — ABNORMAL LOW (ref 36.0–46.0)
Hemoglobin: 10.8 g/dL — ABNORMAL LOW (ref 12.0–15.0)
Lymphocytes Relative: 4 %
Lymphs Abs: 1.2 10*3/uL (ref 0.7–4.0)
MCH: 31.5 pg (ref 26.0–34.0)
MCHC: 31.6 g/dL (ref 30.0–36.0)
MCV: 99.7 fL (ref 80.0–100.0)
Metamyelocytes Relative: 1 %
Monocytes Absolute: 3.1 10*3/uL — ABNORMAL HIGH (ref 0.1–1.0)
Monocytes Relative: 10 %
Myelocytes: 4 %
Neutro Abs: 25.9 10*3/uL — ABNORMAL HIGH (ref 1.7–7.7)
Neutrophils Relative %: 76 %
Other: 0 %
Platelets: 177 10*3/uL (ref 150–400)
Promyelocytes Relative: 3 %
RBC: 3.43 MIL/uL — ABNORMAL LOW (ref 3.87–5.11)
RDW: 16.4 % — ABNORMAL HIGH (ref 11.5–15.5)
WBC: 30.8 10*3/uL — ABNORMAL HIGH (ref 4.0–10.5)
nRBC: 0.3 % — ABNORMAL HIGH (ref 0.0–0.2)
nRBC: 1 /100 WBC — ABNORMAL HIGH

## 2019-05-15 LAB — COMPREHENSIVE METABOLIC PANEL
ALT: 68 U/L — ABNORMAL HIGH (ref 0–44)
AST: 61 U/L — ABNORMAL HIGH (ref 15–41)
Albumin: 2.1 g/dL — ABNORMAL LOW (ref 3.5–5.0)
Alkaline Phosphatase: 137 U/L — ABNORMAL HIGH (ref 38–126)
Anion gap: 10 (ref 5–15)
BUN: 20 mg/dL (ref 8–23)
CO2: 23 mmol/L (ref 22–32)
Calcium: 7.9 mg/dL — ABNORMAL LOW (ref 8.9–10.3)
Chloride: 103 mmol/L (ref 98–111)
Creatinine, Ser: 0.63 mg/dL (ref 0.44–1.00)
GFR calc Af Amer: >60 mL/min (ref >60)
GFR calc non Af Amer: >60 mL/min (ref >60)
Glucose, Bld: 158 mg/dL — ABNORMAL HIGH (ref 70–99)
Potassium: 4.6 mmol/L (ref 3.5–5.1)
Sodium: 136 mmol/L (ref 135–145)
Total Bilirubin: 0.3 mg/dL (ref 0.3–1.2)
Total Protein: 4.8 g/dL — ABNORMAL LOW (ref 6.5–8.1)

## 2019-05-15 MED ORDER — POLYETHYLENE GLYCOL 3350 17 G PO PACK
17.0000 g | PACK | Freq: Every day | ORAL | Status: DC
  Filled 2019-05-15 (×2): qty 1

## 2019-05-15 MED ORDER — BISACODYL 10 MG RE SUPP
10.0000 mg | Freq: Every day | RECTAL | Status: DC
  Administered 2019-05-15: 19:00:00 10 mg via RECTAL
  Filled 2019-05-15 (×4): qty 1

## 2019-05-15 MED ORDER — SODIUM CHLORIDE 0.9% FLUSH
10.0000 mL | INTRAVENOUS | Status: DC | PRN
  Administered 2019-06-03 – 2019-06-06 (×3): 10 mL
  Filled 2019-05-15 (×3): qty 40

## 2019-05-15 MED ORDER — DEXAMETHASONE 4 MG PO TABS
6.0000 mg | ORAL_TABLET | Freq: Two times a day (BID) | ORAL | Status: DC
  Administered 2019-05-15 – 2019-05-23 (×16): 6 mg via ORAL
  Filled 2019-05-15 (×17): qty 1

## 2019-05-15 MED ORDER — LINACLOTIDE 72 MCG PO CAPS
72.0000 ug | ORAL_CAPSULE | Freq: Every day | ORAL | Status: DC
  Administered 2019-05-17 – 2019-05-30 (×14): 72 ug via ORAL
  Filled 2019-05-15 (×15): qty 1

## 2019-05-15 NOTE — Progress Notes (Signed)
Prepped patient for her evening bowel program.  First, RN checked patients rectal vault for stool digitally.  Patient noted to have adequate rectal tone and full sensation.  No stool noted in rectal vault.  Suppository placed.  Will allow adequate time for medication to work and then perform digital stimulation.  Brita Romp, RN

## 2019-05-15 NOTE — Progress Notes (Signed)
Spoke with Dr. Earlie Server in regards to current dosing of Decadron recommendations are to taper Decadron to off until follow-up in outpatient office with oncology

## 2019-05-15 NOTE — Plan of Care (Signed)
  Problem: Consults Goal: RH SPINAL CORD INJURY PATIENT EDUCATION Description:  See Patient Education module for education specifics.  Outcome: Progressing Goal: Skin Care Protocol Initiated - if Braden Score 18 or less Description: If consults are not indicated, leave blank or document N/A Outcome: Progressing   Problem: SCI BOWEL ELIMINATION Goal: RH STG MANAGE BOWEL WITH ASSISTANCE Description: STG Manage Bowel with min Assistance. Outcome: Progressing Goal: RH STG SCI MANAGE BOWEL WITH MEDICATION WITH ASSISTANCE Description: STG SCI Manage bowel with medication with min assistance. Outcome: Progressing Goal: RH STG SCI MANAGE BOWEL PROGRAM W/ASSIST OR AS APPROPRIATE Description: STG SCI Manage bowel program w/min assist or as appropriate. Outcome: Progressing   Problem: RH SKIN INTEGRITY Goal: RH STG MAINTAIN SKIN INTEGRITY WITH ASSISTANCE Description: STG Maintain Skin Integrity With mod Assistance. Outcome: Progressing   Problem: RH PAIN MANAGEMENT Goal: RH STG PAIN MANAGED AT OR BELOW PT'S PAIN GOAL Description: < 4 Outcome: Progressing

## 2019-05-15 NOTE — Telephone Encounter (Signed)
Med onc appts- Spoke with pt and told her Rebecca Lucas is  aware of her recent surgery and getting inpt rehab. He is cancelling  chemo for 2-3 weeks .   I cancelled all med onc appts 9/3-9/17. Teagan is aware and will call us closer to 9/24 appt for update.

## 2019-05-15 NOTE — Progress Notes (Signed)
Patient information reviewed and entered into [facility] System by Becky Million Maharaj, PPS coordinator. Information including medical coding, function ability, and quality indicators will be reviewed and updated through discharge.   

## 2019-05-15 NOTE — Progress Notes (Signed)
Orthopedic Tech Progress Note Patient Details:  Rebecca Lucas 27-Jul-1956 [id number] Called in order to [employer] for a PRAFO BOOT Patient ID: Rebecca Lucas, female   DOB: 1955/09/18, 63 y.o.   MRN: [id number]   Janit Pagan 05/15/2019, 11:47 AM

## 2019-05-15 NOTE — Evaluation (Signed)
Physical Therapy Assessment and Plan  Patient Details  Name: Rebecca Lucas MRN: [id number] Date of Birth: 24-Apr-1956  PT Diagnosis: Abnormality of gait, Difficulty walking, Impaired sensation and Muscle weakness Rehab Potential: Good ELOS: 14-16 days   Today's Date: 05/15/2019 PT Individual Time: 0830-0930 PT Individual Time Calculation (min): 60 min    Problem List:  Patient Active Problem List   Diagnosis Date Noted  . Foot drop, left   . Neurogenic bowel   . Cauda equina syndrome ([place]) 05/14/2019  . Urinary retention   . Surgery, elective   . 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 Acute urinary retention 05/07/2019  . Elevated LFTs 05/07/2019  . 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 Oral thrush 04/04/2018  . Goals of care, counseling/discussion 04/04/2018  . Encounter for antineoplastic chemotherapy 04/04/2018  . Encounter for antineoplastic immunotherapy 04/04/2018  . 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 Metastatic malignant neoplasm ([place]) 03/06/2018  . Primary cancer of right upper lobe of lung ([place]) 03/06/2018  . Bone metastasis ([place]) 03/06/2018    Past Medical History:  Past Medical History:  Diagnosis Date  . Cancer of upper lobe of right lung [facility])    Past Surgical History:  Past Surgical History:  Procedure Laterality Date  . APPLICATION OF ROBOTIC ASSISTANCE FOR SPINAL PROCEDURE N/A 05/09/2019   Procedure: APPLICATION OF ROBOTIC ASSISTANCE FOR SPINAL PROCEDURE;  Surgeon: Consuella Lose, MD;  Location: [place];  Service: Neurosurgery;  Laterality: N/A;  APPLICATION OF ROBOTIC ASSISTANCE FOR SPINAL PROCEDURE  . IR IMAGING GUIDED PORT INSERTION  06/26/2018  . 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 She had been on oral dexamethasone which had been weaned off. COVID negative, urine culture no growth, alkaline phosphatase 200, WBC 14,700. X-ray and imaging revealed progressed and bulky spinal and paraspinal tumor L1-L3 encompassing 78 x 62 x 67 mm. Subtotal involvement of the L2 vertebrae increased as compared to prior tracings. Circumferential epidural tumor substantially increased resulting in moderate to severe malalignment spinal and severe left lateral recess stenosis. Epidural tumor completely infiltrates the left L1 and L2 neural foramina partial erosion of the anterior endplates of L1 and L3 since April. Stable pathologic compression fractures of L2 with mild retropulsion. Visible right sacral and left iliac bone metastasis appears stable. Underwent L2 laminectomy, bilateral pediculotomyfor decompression of thecal sac and nerve roots with posterior segmental instrumentation T12-L4 and posterolateral arthrodesis L1-L3 05/09/2019 per Dr. Kathyrn Sheriff. Hospital course pain management. Back brace when out of bed applied in sitting position. Acute blood loss anemia 6.8 patient was transfused latest hemoglobin 9.7. Reactive leukocytosis due to systemic steroids  and monitored  with latest WBC 28,300. Hematology oncology follow-up with Dr. Earlie Server and await plan for chemotherapy. Therapy evaluations completed and patient was admitted for a comprehensive rehab program. Patient transferred to CIR on 05/14/2019 .   Patient currently requires mod with mobility secondary to muscle weakness, abnormal tone and unbalanced muscle activation and decreased sitting balance, decreased standing balance and decreased balance strategies.  Prior to hospitalization, patient was supervision with mobility and lived with Spouse in a House home.  Home access is  Level entry.  Patient will benefit from skilled PT intervention to maximize safe functional mobility, minimize fall risk and decrease caregiver burden for planned discharge home with 24 hour assist.  Anticipate patient will benefit from follow up [facility] at discharge.  PT - End of Session Activity Tolerance: Tolerates 30+ min activity with multiple rests Endurance Deficit: Yes Endurance Deficit Description: frequent rest breaks during functionl activities PT Assessment Rehab Potential (ACUTE/IP ONLY): Good PT Barriers to Discharge: Medical stability;Pending chemo/radiation PT Patient demonstrates impairments in the following area(s): Endurance;Motor;Pain;Safety;Sensory PT Transfers Functional Problem(s): Bed Mobility;Bed to Chair;Car;Furniture;Floor PT Locomotion Functional Problem(s): Ambulation;Wheelchair Mobility;Stairs PT Plan PT Intensity: Minimum of 1-2 x/day ,45 to 90 minutes PT Frequency: 5 out of 7 days PT Duration Estimated Length of Stay: 14-16 days PT Treatment/Interventions: Ambulation/gait training;Community reintegration;Discharge planning;Disease management/prevention;DME/adaptive equipment instruction;Functional mobility training;Neuromuscular re-education;Pain management;Patient/family education;Psychosocial support;Splinting/orthotics;Stair training;Therapeutic Activities;Therapeutic Exercise;UE/LE  Strength taining/ROM;UE/LE Coordination activities;Wheelchair propulsion/positioning PT Transfers Anticipated Outcome(s): Supervision PT Locomotion Anticipated Outcome(s): Supervision with RW PT Recommendation Recommendations for Other Services: Neuropsych consult;Therapeutic Recreation consult Therapeutic Recreation Interventions: Stress management Follow Up Recommendations: Home health PT Patient destination: Home Equipment Recommended: Rolling walker with 5" wheels Equipment Details: owns w/c and rollator  Skilled Therapeutic Intervention Evaluation completed (see details above and below) with education on PT POC and goals and individual treatment initiated with focus on functional transfer and gait assessment. Pt received seated in bed, agreeable to PT session. See pain details below. Supine to sit with mod A. Max A to don LSO while seated EOB. Pt reports urge to urinate. Sit to stand with mod A to RW. Stand pivot transfer bed to [facility] with RW and min A. Pt is able to continently void urine while seated on BSC. Stand pivot transfer to w/c with min A and RW. Ambulation x 30 ft with RW and mod A. Pt exhibits decreased L ankle DF and knee flexion during gait and uses hip flexors to swing leg to advance it. Pt would benefit from AFO assessment to determine best brace to assist with gait. Pt requests to return to bed at end of session. Stand pivot transfer w/c to bed with mod A. Sit to supine mod A for BLE management. Pt left sidelying in bed with needs in reach, bed alarm in place at end of session.  PT Evaluation Precautions/Restrictions Precautions Precautions: Fall;Back Precaution Comments: reviewed spinal precautions Required Braces or Orthoses: Spinal Brace Spinal Brace: Lumbar corset;Applied in sitting position Restrictions Weight Bearing Restrictions: No Pain Pain Assessment Pain Scale: 0-10 Pain Score: 3  Pain Type: Surgical pain Pain Location: Back Pain Orientation:  Posterior Pain Descriptors / Indicators: Throbbing Pain Frequency: Occasional Pain Onset: On-going Home Living/Prior Functioning Home Living Available Help at Discharge: Family;Available 24 hours/day Type of Home: House Home Access: Level entry Home Layout: One level Bathroom Shower/Tub: Multimedia programmer: Handicapped height Bathroom Accessibility: Yes  Lives With: Spouse Prior Function Level of Independence: Needs assistance with ADLs;Needs assistance with gait  Able to Take Stairs?: No Driving:  No Vision/Perception  Perception Perception: Within Functional Limits Praxis Praxis: Intact  Cognition Overall Cognitive Status: Within Functional Limits for tasks assessed Arousal/Alertness: Awake/alert Orientation Level: Oriented X4 Attention: Sustained Sustained Attention: Appears intact Divided Attention: Appears intact Memory: Appears intact Immediate Memory Recall: Sock;Blue;Bed Memory Recall Sock: Without Cue Memory Recall Blue: Without Cue Memory Recall Bed: Without Cue Awareness: Appears intact Problem Solving: Appears intact Safety/Judgment: Appears intact Sensation Sensation Light Touch: Impaired Detail Light Touch Impaired Details: Impaired LLE;Impaired RUE;Impaired LUE(tingling in fingertips, LLE N/T, improved since surgery) Proprioception: Impaired Detail Proprioception Impaired Details: Impaired LLE(distal > proximal) Coordination Gross Motor Movements are Fluid and Coordinated: No Fine Motor Movements are Fluid and Coordinated: Yes Coordination and Movement Description: impaired 2/2 pain and LLE weakness Motor  Motor Motor: Abnormal postural alignment and control Motor - Skilled Clinical Observations: impaired 2/2 pain and weakness  Mobility Bed Mobility Bed Mobility: Rolling Right;Rolling Left;Supine to Sit;Sit to Supine Rolling Right: Minimal Assistance - Patient > 75% Rolling Left: Minimal Assistance - Patient > 75% Supine to Sit:  Moderate Assistance - Patient 50-74% Sit to Supine: Moderate Assistance - Patient 50-74% Transfers Transfers: Sit to Stand;Stand Pivot Transfers Sit to Stand: Moderate Assistance - Patient 50-74% Stand Pivot Transfers: Moderate Assistance - Patient 50 - 74% Stand Pivot Transfer Details: Verbal cues for technique;Verbal cues for precautions/safety;Verbal cues for safe use of DME/AE Transfer (Assistive device): Rolling walker Locomotion  Gait Assistive device: Rolling walker Gait Gait Pattern: Impaired(no L knee flex, no L DF, swings L hip forwards) Gait velocity: decreased Stairs / Additional Locomotion Stairs: No Wheelchair Mobility Wheelchair Mobility: No  Trunk/Postural Assessment  Cervical Assessment Cervical Assessment: Within Functional Limits Thoracic Assessment Thoracic Assessment: Exceptions to WFL(back precautions) Lumbar Assessment Lumbar Assessment: Exceptions to WFL(back precautions, lumbar corset) Postural Control Postural Control: Within Functional Limits  Balance Balance Balance Assessed: Yes Static Sitting Balance Static Sitting - Balance Support: No upper extremity supported;Feet supported Static Sitting - Level of Assistance: 5: Stand by assistance Static Sitting - Comment/# of Minutes: Sitting EOB Dynamic Sitting Balance Dynamic Sitting - Balance Support: No upper extremity supported;Feet supported;During functional activity Dynamic Sitting - Level of Assistance: 5: Stand by assistance;4: Min assist Sitting balance - Comments: Sitting on BSC to complete bathing task Static Standing Balance Static Standing - Balance Support: Bilateral upper extremity supported;During functional activity Static Standing - Level of Assistance: 4: Min assist Dynamic Standing Balance Dynamic Standing - Balance Support: Bilateral upper extremity supported;During functional activity Dynamic Standing - Level of Assistance: 3: Mod assist Dynamic Standing - Comments: Standing to  pull up pants Extremity Assessment  RUE Assessment RUE Assessment: Within Functional Limits LUE Assessment LUE Assessment: Within Functional Limits RLE Assessment RLE Assessment: Exceptions to [facility] Passive Range of Motion (PROM) Comments: [facility] General Strength Comments: impaired, see below RLE Strength Right Hip Flexion: 3+/5 Right Knee Flexion: 4/5 Right Knee Extension: 4/5 Right Ankle Dorsiflexion: 4/5 LLE Assessment LLE Assessment: Exceptions to [facility] Passive Range of Motion (PROM) Comments: WFL General Strength Comments: impaired, see below LLE Strength Left Hip Flexion: 2-/5 Left Knee Flexion: 2-/5 Left Knee Extension: 2-/5 Left Ankle Dorsiflexion: 1/5    Refer to Care Plan for Long Term Goals  Recommendations for other services: Neuropsych and Therapeutic Recreation  Stress management  Discharge Criteria: Patient will be discharged from PT if patient refuses treatment 3 consecutive times without medical reason, if treatment goals not met, if there is a change in medical status, if patient makes no progress towards goals or if patient  is discharged from hospital.  The above assessment, treatment plan, treatment alternatives and goals were discussed and mutually agreed upon: by patient   Excell Seltzer, PT, DPT 05/15/2019, 3:51 PM

## 2019-05-15 NOTE — Progress Notes (Signed)
[name] PHYSICAL MEDICINE & REHABILITATION PROGRESS NOTE   Subjective/Complaints:  Pt reports:- having bowel incontinence at night- "squirts" of loose/liquid type stools- no significant BM x 2  Days, and incontinence is only occurring at night- not during day. Feels like lost all strength in LLE 6 weeks ago when had "hard fall"- no ability to move LLE anymore except wiggle toes and push foot down somewhat.  Most of pain is incisional- otherwise, pain is "good".  Said when pushes, can pee- void/stop stream/empty- per pt, PVRs were basically empty.  Willing to try and stop Linzess since at lowest dose- since having such loose stools- discussed bowel program ot try and stop bowel accidents- pt is willing to start- will do in evening.  (-) for DVT- Dopplers   Objective:   Vas [place] Lower Extremity Venous (dvt)  Result Date: 05/14/2019  Lower Venous Study Indications: Edema.  Comparison Study: no prior Performing Technologist: Abram Sander RVS  Examination Guidelines: A complete evaluation includes B-mode imaging, spectral Doppler, color Doppler, and power Doppler as needed of all accessible portions of each vessel. Bilateral testing is considered an integral part of a complete examination. Limited examinations for reoccurring indications may be performed as noted.  +---------+---------------+---------+-----------+----------+--------------+ RIGHT    CompressibilityPhasicitySpontaneityPropertiesThrombus Aging +---------+---------------+---------+-----------+----------+--------------+ CFV      Full           Yes      Yes                                 +---------+---------------+---------+-----------+----------+--------------+ SFJ      Full                                                        +---------+---------------+---------+-----------+----------+--------------+ FV Prox  Full                                                         +---------+---------------+---------+-----------+----------+--------------+ FV Mid   Full                                                        +---------+---------------+---------+-----------+----------+--------------+ FV DistalFull                                                        +---------+---------------+---------+-----------+----------+--------------+ PFV      Full                                                        +---------+---------------+---------+-----------+----------+--------------+ POP      Full           Yes  Yes                                 +---------+---------------+---------+-----------+----------+--------------+ PTV      Full                                                        +---------+---------------+---------+-----------+----------+--------------+ PERO     Full                                                        +---------+---------------+---------+-----------+----------+--------------+   +---------+---------------+---------+-----------+----------+--------------+ LEFT     CompressibilityPhasicitySpontaneityPropertiesThrombus Aging +---------+---------------+---------+-----------+----------+--------------+ CFV      Full           Yes      Yes                                 +---------+---------------+---------+-----------+----------+--------------+ SFJ      Full                                                        +---------+---------------+---------+-----------+----------+--------------+ FV Prox  Full                                                        +---------+---------------+---------+-----------+----------+--------------+ FV Mid   Full                                                        +---------+---------------+---------+-----------+----------+--------------+ FV DistalFull                                                         +---------+---------------+---------+-----------+----------+--------------+ PFV      Full                                                        +---------+---------------+---------+-----------+----------+--------------+ POP      Full           Yes      Yes                                 +---------+---------------+---------+-----------+----------+--------------+ PTV      Full                                                        +---------+---------------+---------+-----------+----------+--------------+  PERO                                                  Not visualized +---------+---------------+---------+-----------+----------+--------------+     Summary: Right: There is no evidence of deep vein thrombosis in the lower extremity. No cystic structure found in the popliteal fossa. Left: There is no evidence of deep vein thrombosis in the lower extremity. No cystic structure found in the popliteal fossa.  *See table(s) above for measurements and observations. Electronically signed by Harold Barban MD on 05/14/2019 at 3:19:19 PM.    Final    Recent Labs    05/15/19 0806  WBC 30.8*  HGB 10.8*  HCT 34.2*  PLT 177   Recent Labs    05/15/19 0806  NA 136  K 4.6  CL 103  CO2 23  GLUCOSE 158*  BUN 20  CREATININE 0.63  CALCIUM 7.9*    Intake/Output Summary (Last 24 hours) at 05/15/2019 1038 Last data filed at 05/15/2019 0730 Gross per 24 hour  Intake 460 ml  Output -  Net 460 ml     Physical Exam: Vital Signs Blood pressure (!) 141/72, pulse 69, temperature 98 F (36.7 C), temperature source Oral, resp. rate 15, height 5\' 7"  (1.702 m), weight 66.2 kg, SpO2 100 %.  Constitutional:  Vitals, labs and imaging reviewed Awake, alert, appropriate, sitting up in bed; needed assistance to lean forward to assess incision, NAD HENT: Round facies- stable Eyes:EOMs intact Neck:Normal range of motion.No tracheal deviationpresent. No thyromegalypresent.   Cardiovascular:Normal rateand regular rhythm.   Respiratory:CTA B/L GB:TDVV. She exhibitsno distension. There isno abdominal tenderness.  Musculoskeletal:Normal range of motion.  Losing a little bit of ankle DF on LLE- can get to 90 degrees, but couldn't get >90 degrees with passive ROM UE 5/5. RLE: 4-/5 to 4/5 prox to distal. LLE: HF,KE trace to 0/5, ADF tr/5, APF 1+/5. Sensation decreased to LT, pain and proprioception from left inguinal area to toes. DTR's absent. Skin:No rashnoted. No erythema.  Incision covered with honeycomb dressing (said it was replaced yesterday)- it has a little/minimal draining near top and near bottom- stable based on picture from yesterday.   Assessment/Plan: 1. 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  Physiatrist is providing close team supervision and 24 hour management of active medical problems listed below.  Physiatrist and rehab team continue to assess barriers to discharge/monitor patient progress toward functional and medical goals  Care Tool:  Bathing              Bathing assist       Upper Body Dressing/Undressing Upper body dressing   What is the patient wearing?: Hospital gown only    Upper body assist Assist Level: Minimal Assistance - Patient > 75%    Lower Body Dressing/Undressing Lower body dressing      What is the patient wearing?: Underwear/pull up     Lower body assist Assist for lower body dressing: Moderate Assistance - Patient 50 - 74%     Toileting Toileting    Toileting assist Assist for toileting: Moderate Assistance - Patient 50 - 74%     Transfers Chair/bed transfer  Transfers assist  Chair/bed transfer assist level: Moderate Assistance - Patient 50 - 74%      Locomotion Ambulation   Ambulation assist              Walk 10 feet activity   Assist           Walk 50 feet activity   Assist           Walk 150 feet activity   Assist           Walk 10 feet on uneven surface  activity   Assist           Wheelchair     Assist               Wheelchair 50 feet with 2 turns activity    Assist            Wheelchair 150 feet activity     Assist          Blood pressure (!) 141/72, pulse 69, temperature 98 F (36.7 C), temperature source Oral, resp. rate 15, height 5\' 7"  (1.702 m), weight 66.2 kg, SpO2 100 %.  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  -admit to inpatient rehab.  -Back brace when out of bed. -Plan is to follow-up outpatient Dr. Earlie Server in regards to chemotherapy 2. Antithrombotics: -DVT/anticoagulation:SCDs.LE venous dopplers performed today and are negative -will not begin lovenox as platelets have been generally trending down (129k 8/30) -antiplatelet therapy: N/A 3. Pain Management:Neurontin 300 mg 3 times daily, Duragesic patch 75 mcg, Advil 400 mg 3 times daily oxycodone as needed 4. Mood:motivational support -antipsychotic agents: N/A 5. Neuropsych: This patientiscapable of making decisions on herown behalf. 6. Skin/Wound Care:Routine skin checks -honeycomb dressing in place currently.  9/2- looking stable 7. Fluids/Electrolytes/Nutrition:Routine in and outs with follow-up chemistriesupon  admit 8. Acute blood loss anemia. Follow-up CBC 9. Leukocytosis secondary to steroids. Continue to monitor  9/2- WBC up to 30.8, from 28.3k- however tapering Decadron as of today- will con't to monitor- recheck Friday. NO signs of infection. 10. Urinary retention. Check PVR  9/2- pt had low PVRs- sounds like can void and empty. 11. Constipation in setting of Neurogenic bowel. Linzess daily. Adjust bowel program as needed  9/2- will con't Linzess since such a low dose and will push stool through colon, but reduce Miralax to daily and start bowel program with dig stim at night/evening.   12/ Dispo- 9/2- team conference today- we are thinking 2 weeks, but don't have exact date as of yet since all evaluations not done yet.    LOS: 1 days A FACE TO FACE EVALUATION WAS PERFORMED  Rebecca Lucas 05/15/2019, 10:38 AM

## 2019-05-15 NOTE — Telephone Encounter (Signed)
Does Rebecca Lucas want to taper Decadron? Per Dr Julien Nordmann I informed [name] to start tapering decadron 6 mg dose.

## 2019-05-15 NOTE — Evaluation (Signed)
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 Foot drop, left   . Neurogenic bowel   . Cauda equina syndrome ([place]) 05/14/2019  . Urinary retention   . Surgery, elective   . 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 Acute urinary retention 05/07/2019  . Elevated LFTs 05/07/2019  . 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 Oral thrush 04/04/2018  . Goals of care, counseling/discussion 04/04/2018  . Encounter for antineoplastic chemotherapy 04/04/2018  . Encounter for antineoplastic immunotherapy 04/04/2018  . 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 Metastatic malignant neoplasm ([place]) 03/06/2018  . Primary cancer of right upper lobe of lung ([place]) 03/06/2018  . Bone metastasis ([place]) 03/06/2018    Past Medical History:  Past Medical History:  Diagnosis Date  . Cancer of upper lobe of right lung [facility])    Past Surgical History:  Past Surgical History:  Procedure Laterality Date  . APPLICATION OF ROBOTIC ASSISTANCE FOR SPINAL PROCEDURE N/A 05/09/2019   Procedure: APPLICATION OF ROBOTIC ASSISTANCE FOR SPINAL PROCEDURE;  Surgeon: Consuella Lose, MD;  Location: [place];  Service: Neurosurgery;  Laterality: N/A;  APPLICATION OF ROBOTIC ASSISTANCE FOR SPINAL PROCEDURE  . IR IMAGING GUIDED PORT INSERTION  06/26/2018  . 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  Presented 05/07/2019 with urinary retention x4 to 5 days as well as weakness and numbness in her left lower extremity but was able to ambulate with a walker for short distances and later using a wheelchair.  She had been on oral dexamethasone which had been weaned off.  COVID negative, urine culture no growth, alkaline phosphatase 200, WBC 14,700.  X-ray and imaging revealed progressed and bulky spinal and paraspinal tumor L1-L3 encompassing 78 x 62 x 67 mm.  Subtotal involvement of the L2 vertebrae increased as compared to prior tracings.  Circumferential epidural tumor substantially increased resulting in moderate to severe malalignment spinal and severe left lateral recess stenosis.  Epidural tumor completely infiltrates the left L1 and L2 neural foramina partial erosion of the anterior endplates of L1 and L3 since April.  Stable pathologic compression fractures of L2 with mild retropulsion.  Visible right sacral and left iliac bone metastasis appears stable.  Underwent L2 laminectomy, bilateral pediculotomy for decompression of thecal sac and nerve roots with posterior segmental instrumentation T12-L4 and posterolateral arthrodesis L1-L3 05/09/2019 per Dr. Kathyrn Sheriff.  Hospital course pain management.  Back brace when out of bed  applied in sitting position.  Acute blood loss anemia 6.8 patient was transfused latest hemoglobin 9.7.   Reactive leukocytosis due to systemic steroids and monitored with latest WBC 28,300.  Hematology oncology follow-up with Dr. Earlie Server and await plan for chemotherapy.  Therapy evaluations completed and patient was admitted for a comprehensive rehab program. Patient transferred to CIR on 05/14/2019 .    Patient currently requires mod with basic self-care skills secondary to muscle weakness and muscle paralysis, decreased cardiorespiratoy endurance and decreased sitting balance, decreased standing balance, decreased postural control, decreased balance strategies and difficulty maintaining precautions.  Prior to hospitalization, patient could complete ADLs with min.  Patient will benefit from skilled intervention to decrease level of assist with basic self-care skills and increase independence with basic self-care skills prior to discharge home with care partner.  Anticipate patient will require 24 hour supervision and follow up home health.  OT - End of Session Activity Tolerance: Tolerates 10 - 20 min activity with multiple rests Endurance Deficit: Yes Endurance Deficit Description: Rest breaks required thorughout seated level ADLs OT Assessment Rehab Potential (ACUTE ONLY): Good OT Barriers to Discharge: Neurogenic Bowel & Bladder OT Barriers to Discharge Comments: Pt currently receiving chemotherapy OT Patient demonstrates impairments in the following area(s): Balance;Safety;Sensory;Edema;Endurance;Motor;Pain;Perception OT Basic ADL's Functional Problem(s): Grooming;Bathing;Dressing;Toileting OT Transfers Functional Problem(s): Toilet;Tub/Shower OT Additional Impairment(s): None OT Plan OT Intensity: Minimum of 1-2 x/day, 45 to 90 minutes OT Frequency: 5 out of 7 days OT Duration/Estimated Length of Stay: 10-12 days OT Treatment/Interventions: Balance/vestibular training;Discharge planning;Pain management;Self Care/advanced ADL retraining;Therapeutic Activities;UE/LE Coordination  activities;Therapeutic Exercise;Skin care/wound managment;Patient/family education;Functional mobility training;Community reintegration;DME/adaptive equipment instruction;Neuromuscular re-education;Psychosocial support;UE/LE Strength taining/ROM;Splinting/orthotics;Wheelchair propulsion/positioning OT Self Feeding Anticipated Outcome(s): Indep OT Basic Self-Care Anticipated Outcome(s): Supervision/set-up OT Toileting Anticipated Outcome(s): Supervision OT Bathroom Transfers Anticipated Outcome(s): Supervision OT Recommendation Recommendations for Other Services: Neuropsych consult;Therapeutic Recreation consult Therapeutic Recreation Interventions: Stress management;Outing/community reintergration Patient destination: Home Follow Up Recommendations: Home health OT Equipment Recommended: 3 in 1 bedside comode   Skilled Therapeutic Intervention Session One: Pt seen for OT eval and ADL bathing/dressing session. Pt in supine upon arrival, agreeable to tx session. Complaints of mild surgical pain, however, denied need for intervention and willing to participate as able.  She transferred to sitting EOB with min A for management of L LE. Min-mod A stand pivot transfer with RW to w/c. Noted limited to no WBing through L foot during transfers. Mod A stand pivot to BSC in shower using grab bars. Unable to perform stand pivot to exit shower 2/2 fatigue, max A squat pivot to exit shower. She bathed with mod A overall with assist for LEs. Used features of BSC to complete pericare/buttock hygiene. Max A LB dressing, standing with RW and mod A for dynamic balance while pt assisted in pulling pants up. Hospital gown donned with set-up. Pt left sitting in w/c at end of session, obtained L elevating leg rest 2/2 edema. All needs in reach.  Education provided throughout session regarding role of OT, POC, SCI, DME, and d/c planning.   Session Two: Pt seen for OT session focusing on functional transfers and AE  training. Pt in supine upon arrival, voiced fatigue from previous tx sessions, however, willing to participate as able. She transferred to EOB with min A using bed rails and VCs for log rolling technique. Completed min A squat pivot transfer to w/c with VCs for technique and hand placement. Stand pivot with mod A to BSC over toilet, pt unable to bear weight through L  LE and dragging foot throughout transfer. Total A for clothing management, pt completing hygiene from seated position.  She self propelled w/c with min A to therapy gym. Education and demonstration provided regarding use of reacher for LB dressing techniques. Pt able to return demonstrate use of reacher to doff B socks with min A for L. Attempted to don socks/shoes, however, unable due to B LE edema L>R. Discussed having husband bring in larger pair of shoes as she needs support and likely AFO.  Pt returned to room and min A squat pivot to return to EOB, mod A to return to side-lying. Pt left in side-lying with all needs in reach, bed alarm on and RN present.   OT Evaluation Precautions/Restrictions  Precautions Precautions: Fall;Back Precaution Comments: reviewed spinal precautions Required Braces or Orthoses: Spinal Brace Spinal Brace: Lumbar corset;Applied in sitting position Restrictions Weight Bearing Restrictions: No General Chart Reviewed: Yes Additional Pertinent History: Currently receiving chemotherapy Home Living/Prior Functioning Home Living Family/patient expects to be discharged to:: Private residence Living Arrangements: Spouse/significant other Available Help at Discharge: Family, Available 24 hours/day Type of Home: House Home Access: Level entry Home Layout: One level Bathroom Shower/Tub: Multimedia programmer: Handicapped height Bathroom Accessibility: Yes  Lives With: Spouse IADL History Occupation: Retired Leisure and Hobbies: Hiking, puzzles, reading Prior Function Level of Independence:  Needs assistance with ADLs, Needs assistance with gait  Able to Take Stairs?: No Driving: No Vision Baseline Vision/History: Wears glasses Wears Glasses: Reading only Patient Visual Report: No change from baseline Vision Assessment?: No apparent visual deficits Perception  Perception: Within Functional Limits Praxis Praxis: Intact Cognition Overall Cognitive Status: Within Functional Limits for tasks assessed Arousal/Alertness: Awake/alert Orientation Level: Person;Place;Situation Person: Oriented Place: Oriented Situation: Oriented Year: 2020 Month: September Day of Week: Correct Memory: Appears intact Immediate Memory Recall: Sock;Blue;Bed Memory Recall Sock: Without Cue Memory Recall Blue: Without Cue Memory Recall Bed: Without Cue Attention: Divided Sustained Attention: Appears intact Divided Attention: Appears intact Awareness: Appears intact Problem Solving: Appears intact Safety/Judgment: Appears intact Sensation Sensation Light Touch: Impaired Detail Light Touch Impaired Details: Impaired LLE;Impaired RUE;Impaired LUE(Numbness/tingling in B finger tips and L LE foot/ankle) Proprioception: Impaired Detail Proprioception Impaired Details: Impaired LLE Coordination Gross Motor Movements are Fluid and Coordinated: No Fine Motor Movements are Fluid and Coordinated: Yes Coordination and Movement Description: impaired 2/2 pain and LLE weakness Motor  Motor Motor: Abnormal postural alignment and control;Paraplegia(L LE> R) Motor - Skilled Clinical Observations: impaired 2/2 pain and weakness Mobility  Bed Mobility Bed Mobility: Rolling Right;Rolling Left;Supine to Sit;Sit to Supine Rolling Right: Minimal Assistance - Patient > 75% Rolling Left: Minimal Assistance - Patient > 75% Supine to Sit: Moderate Assistance - Patient 50-74% Sit to Supine: Moderate Assistance - Patient 50-74% Transfers Sit to Stand: Moderate Assistance - Patient 50-74%  Trunk/Postural  Assessment  Cervical Assessment Cervical Assessment: Within Functional Limits Thoracic Assessment Thoracic Assessment: Exceptions to WFL(Back pre-cautions) Lumbar Assessment Lumbar Assessment: Exceptions to WFL(Back pre-cautions) Postural Control Postural Control: Within Functional Limits  Balance Balance Balance Assessed: Yes Static Sitting Balance Static Sitting - Balance Support: No upper extremity supported;Feet supported Static Sitting - Level of Assistance: 5: Stand by assistance Static Sitting - Comment/# of Minutes: Sitting EOB Dynamic Sitting Balance Dynamic Sitting - Balance Support: No upper extremity supported;Feet supported;During functional activity Dynamic Sitting - Level of Assistance: 4: Min assist Sitting balance - Comments: Sitting on BSC to complete bathing task Static Standing Balance Static Standing - Balance Support: During functional activity;Bilateral upper extremity  supported Static Standing - Level of Assistance: 4: Min assist Dynamic Standing Balance Dynamic Standing - Balance Support: During functional activity;Right upper extremity supported;Left upper extremity supported Dynamic Standing - Level of Assistance: 3: Mod assist Dynamic Standing - Comments: Standing to pull up pants Extremity/Trunk Assessment RUE Assessment RUE Assessment: Within Functional Limits LUE Assessment LUE Assessment: Within Functional Limits     Refer to Care Plan for Long Term Goals  Recommendations for other services: Neuropsych and Therapeutic Recreation  Stress management and Outing/community reintegration   Discharge Criteria: Patient will be discharged from OT if patient refuses treatment 3 consecutive times without medical reason, if treatment goals not met, if there is a change in medical status, if patient makes no progress towards goals or if patient is discharged from hospital.  The above assessment, treatment plan, treatment alternatives and goals were  discussed and mutually agreed upon: by patient  Mariah Harn L 05/15/2019, 12:21 PM

## 2019-05-15 NOTE — Progress Notes (Signed)
Physical Therapy Session Note  Patient Details  Name: Rebecca Lucas MRN: [id number] Date of Birth: 12/15/1955  Today's Date: 05/15/2019 PT Individual Time: 0830-0930 PT Individual Time Calculation (min): 60 min   Short Term Goals: Week 1:  PT Short Term Goal 1 (Week 1): Pt will be able to complete transfers with min A consistently PT Short Term Goal 2 (Week 1): Pt will ambulate x 50 ft with LRAD and mod A PT Short Term Goal 3 (Week 1): Pt will initiate w/c mobility  Skilled Therapeutic Interventions/Progress Updates:   Pt received supine in bed and agreeable to PT. Supine>sit transfer with  assist and mod assist from PT for BLE Management. Squat pivot transfer to [facility] with min assist and moderate cues for set up   WC mobility x 142f with supervision assist from PT for doorway management and improved use of RUE to maintain straight path in hall.   Car transfer with mod assist using squat pivot technique. Cues for safety, LE positioning and technique to maintain back precautions. Pt performed sit<>stand with mod assist,and attempted pre-gait stepping task with RW. Mod assist from PT to prevent LLE knee collapse. Pt unable to advance the LLE at this time.    Squat pivot to and from nustep with mod assist onto nustep and min assist [facility] Reciprocal movement and endurance training on nustep BLE and BUE with emphasis on LE. Cues for posture, symmetry, and full ROM on the LLE, level 3 x 10 min. Pt returned to room and performed squat pivot transfer to bed with min assist and UE support on rail of bed. Sit>supine completed with mod assist for BLE management through log roll. Pt left supine in bed with call bell in reach and all needs met.     Therapy Documentation Precautions:  Precautions Precautions: Fall, Back Precaution Comments: reviewed spinal precautions Required Braces or Orthoses: Spinal Brace Spinal Brace: Lumbar corset, Applied in sitting  position Restrictions Weight Bearing Restrictions: No Vital Signs: Therapy Vitals Temp: (!) 97.3 F (36.3 C) Pulse Rate: 86 BP: 119/61 Patient Position (if appropriate): Lying Oxygen Therapy SpO2: 98 % O2 Device: Room Air Pain:   denies at rest    Therapy/Group: Individual Therapy  ALorie Phenix9/10/2018, 5:09 PM

## 2019-05-16 ENCOUNTER — Ambulatory Visit: Payer: 59 | Admitting: Internal Medicine

## 2019-05-16 ENCOUNTER — Inpatient Hospital Stay (HOSPITAL_COMMUNITY): Payer: 59 | Admitting: Occupational Therapy

## 2019-05-16 ENCOUNTER — Ambulatory Visit: Payer: 59

## 2019-05-16 ENCOUNTER — Other Ambulatory Visit: Payer: 59

## 2019-05-16 ENCOUNTER — Inpatient Hospital Stay (HOSPITAL_COMMUNITY): Payer: 59 | Admitting: Physical Therapy

## 2019-05-16 DIAGNOSIS — D72825 Bandemia: Secondary | ICD-10-CM | POA: Diagnosis present

## 2019-05-16 LAB — PATHOLOGIST SMEAR REVIEW

## 2019-05-16 NOTE — Progress Notes (Signed)
Social Work Assessment and Plan   Patient Details  Name: Rebecca Lucas MRN: [id number] Date of Birth: 1956-04-12  Today's Date: 05/16/2019  Problem List:  Patient Active Problem List   Diagnosis Date Noted  . Bandemia   . Foot drop, left   . Neurogenic bowel   . Cauda equina syndrome ([place]) 05/14/2019  . Urinary retention   . Surgery, elective   . 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 Acute urinary retention 05/07/2019  . Elevated LFTs 05/07/2019  . 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 Oral thrush 04/04/2018  . Goals of care, counseling/discussion 04/04/2018  . Encounter for antineoplastic chemotherapy 04/04/2018  . Encounter for antineoplastic immunotherapy 04/04/2018  . 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 Metastatic malignant neoplasm ([place]) 03/06/2018  . Primary cancer of right upper lobe of lung ([place]) 03/06/2018  . Bone metastasis ([place]) 03/06/2018   Past Medical History:  Past Medical History:  Diagnosis Date  . Cancer of upper lobe of right lung [facility])    Past Surgical History:  Past Surgical History:  Procedure Laterality Date  . APPLICATION OF ROBOTIC ASSISTANCE FOR SPINAL PROCEDURE N/A 05/09/2019   Procedure: APPLICATION OF ROBOTIC ASSISTANCE FOR SPINAL PROCEDURE;  Surgeon: Consuella Lose, MD;  Location: [place];  Service: Neurosurgery;  Laterality: N/A;  APPLICATION OF ROBOTIC ASSISTANCE FOR SPINAL PROCEDURE  . IR IMAGING GUIDED PORT INSERTION  06/26/2018  . POSTERIOR LUMBAR FUSION 4 LEVEL N/A 05/09/2019   Procedure: Lumbar Two Laminectomy; Thoracic Twelve-Lumbar Four instrumented stabilization;  Surgeon: Consuella Lose, MD;  Location: [place];  Service: Neurosurgery;  Laterality: N/A;  Lumbar Two Laminectomy; Thoracic Twelve-Lumbar Four instrumented stabilization   Social History:  reports that she has never smoked. She has never used smokeless tobacco.  She reports current alcohol use. She reports that she does not use drugs.  Family / Support Systems Marital Status: Married Patient Roles: Spouse, Parent Spouse/Significant Other: spouse, Rebecca Lucas @ (C) [phone] or (W) [phone] Children: daughter, Rebecca Lucas, living locally and son, Rebecca Lucas in [place] area Other Supports: brother, Rebecca Lucas and his wife, Rebecca Lucas;  father, Rebecca Lucas and sister, Rebecca Lucas.  Several friends as well. Anticipated Caregiver: spouse, brother and sister in law, father; daughter just had her second child on 8/31 Ability/Limitations of Caregiver: spouse works for [employer] occupational health Caregiver Availability: 24/7 Family Dynamics: Patient and spouse report having excellent support from local family and friends who were all able to help spouse and coverage of 24/7 support for patient at discharge.  Spouse and adult children are extremely supportive and encouraging to patient.  Social History Preferred language: English Religion: Lutheran Cultural Background: N/A Read: Yes Write: Yes Employment Status: Unemployed Public relations account executive Issues: None Guardian/Conservator: None-per MD, patient is capable of making decisions on her own behalf.   Abuse/Neglect Abuse/Neglect Assessment Can Be Completed: Yes Physical Abuse: Denies Verbal Abuse: Denies Sexual Abuse: Denies Exploitation of patient/patient's resources: Denies Self-Neglect: Denies  Emotional Status Pt's affect, behavior and adjustment status: Patient sitting up in wheelchair and does report fatigue from first few sessions of therapy, however, is very agreeable to assessment interview.  She is very pleasant and smiling throughout our discussion and is hopeful for good recovery here on CIR.  Patient reports mood is stable.  She talks openly about her cancer course and the tremendous support she is receiving from family and friends.  We will monitor mood while here and refer  for neuropsychology if  indicated. Recent Psychosocial Issues: None Psychiatric History: None Substance Abuse History: None  Patient / Family Perceptions, Expectations & Goals Pt/Family understanding of illness & functional limitations: Patient and spouse (who is a physician with occupational health at [employer]) both have very good understanding of her current diagnoses and plans for chemo follow-up.  Both with very good understanding of resulting functional limitations following the surgery/need for CIR. Premorbid pt/family roles/activities: Patient has been independent overall until a few days prior to hospitalization. Anticipated changes in roles/activities/participation: Little change anticipated as spouse and family were providing support PTA.  Per team goals of supervision overall, however, all will need to work with her to provide 24/7 support at least initially after discharge. Pt/family expectations/goals: "I just hope I make a good recovery and get my strength back so that I can continue with my chemo."  [employer]: Other (Comment)([facility]) Premorbid Home Care/DME Agencies: None Transportation available at discharge: Yes Resource referrals recommended: Neuropsychology  Discharge Planning Living Arrangements: Spouse/significant other Support Systems: Spouse/significant other, Children, Parent, Other relatives, Friends/neighbors Type of Residence: Private residence Insurance Resources: Multimedia programmer (specify)([employer]) Financial Resources: Family Support Financial Screen Referred: No Living Expenses: Medical laboratory scientific officer Management: Spouse Does the patient have any problems obtaining your medications?: No Home Management: Pt and spouse Patient/Family Preliminary Plans: Patient to return home with spouse as primary caregiver.  Additional support from local family and friends. Social Work Anticipated Follow Up Needs: HH/OP Expected length of stay: 14-16 days  Clinical  Impression Very pleasant, talkative woman here following lumbar tumor surgery.  At time that tumor was found patient already receiving chemotherapy for lung cancer for which she was diagnosed in June 2019.  She is very motivated for CIR and hopeful to regain her strength and restart her chemo treatments.  Excellent support from her spouse and local family members as well as friends who are committed to covering 24/7 support at start.  Patient's mood is very positive and optimistic.  We will monitor and refer for neuropsychology as indicated during her stay.  Lyan Moyano 05/16/2019, 4:28 PM

## 2019-05-16 NOTE — Progress Notes (Signed)
Fentanyl patch removed, wasted in stericycle, Silvestre Moment, LPN witnessed.Patrici Ranks A

## 2019-05-16 NOTE — Progress Notes (Signed)
Supp. Given on previous shift at [facility]. At 1923, patient up to [facility] with continent, moderate type 5 BM. At 2120, up to [facility] with continent, large type 4 BM. Continent of urine. Patient asking about decadron taper. Pain managed with scheduled meds and PRN oxy ir given at 2137.  Honeycomb dressing CD&I to back. Bilateral pitting pedal edema. Left PRAFO boot in place.  Unable to dorsiflex left foot and reports tingling & pins and needles to LLE. Rebecca Lucas A

## 2019-05-16 NOTE — Progress Notes (Signed)
[place] PHYSICAL MEDICINE & REHABILITATION PROGRESS NOTE   Subjective/Complaints:  Pt reports- little sore from yesterday's therapy. Incisional pain 5-6/10- took tylenol this AM (per chart, pt refused tylenol at 5am, might have taken later). Rebecca Lucas is due for oxy at 9:30am - plans to take and fentanyl patch was changed yesterday.  Had a loose stool  Yesterday with bowel program and then had urge to have another one 1 hr later, and did- both were soft/formed- wasn't sure if that meant bowel program didn't work- assured her it takes 3-6 weeks for bowel program to develop a "habit' - she declined miralax and linzess this AM- discussed and decided she needs to take linzess since small dose, but can hold miralax.  Objective:   Vas [place] Lower Extremity Venous (dvt)  Result Date: 05/14/2019  Lower Venous Study Indications: Edema.  Comparison Study: no prior Performing Technologist: Abram Sander RVS  Examination Guidelines: A complete evaluation includes B-mode imaging, spectral Doppler, color Doppler, and power Doppler as needed of all accessible portions of each vessel. Bilateral testing is considered an integral part of a complete examination. Limited examinations for reoccurring indications may be performed as noted.  +---------+---------------+---------+-----------+----------+--------------+ RIGHT    CompressibilityPhasicitySpontaneityPropertiesThrombus Aging +---------+---------------+---------+-----------+----------+--------------+ CFV      Full           Yes      Yes                                 +---------+---------------+---------+-----------+----------+--------------+ SFJ      Full                                                        +---------+---------------+---------+-----------+----------+--------------+ FV Prox  Full                                                        +---------+---------------+---------+-----------+----------+--------------+ FV Mid   Full                                                         +---------+---------------+---------+-----------+----------+--------------+ FV DistalFull                                                        +---------+---------------+---------+-----------+----------+--------------+ PFV      Full                                                        +---------+---------------+---------+-----------+----------+--------------+ POP      Full           Yes      Yes                                 +---------+---------------+---------+-----------+----------+--------------+  PTV      Full                                                        +---------+---------------+---------+-----------+----------+--------------+ PERO     Full                                                        +---------+---------------+---------+-----------+----------+--------------+   +---------+---------------+---------+-----------+----------+--------------+ LEFT     CompressibilityPhasicitySpontaneityPropertiesThrombus Aging +---------+---------------+---------+-----------+----------+--------------+ CFV      Full           Yes      Yes                                 +---------+---------------+---------+-----------+----------+--------------+ SFJ      Full                                                        +---------+---------------+---------+-----------+----------+--------------+ FV Prox  Full                                                        +---------+---------------+---------+-----------+----------+--------------+ FV Mid   Full                                                        +---------+---------------+---------+-----------+----------+--------------+ FV DistalFull                                                        +---------+---------------+---------+-----------+----------+--------------+ PFV      Full                                                         +---------+---------------+---------+-----------+----------+--------------+ POP      Full           Yes      Yes                                 +---------+---------------+---------+-----------+----------+--------------+ PTV      Full                                                        +---------+---------------+---------+-----------+----------+--------------+  PERO                                                  Not visualized +---------+---------------+---------+-----------+----------+--------------+     Summary: Right: There is no evidence of deep vein thrombosis in the lower extremity. No cystic structure found in the popliteal fossa. Left: There is no evidence of deep vein thrombosis in the lower extremity. No cystic structure found in the popliteal fossa.  *See table(s) above for measurements and observations. Electronically signed by Harold Barban MD on 05/14/2019 at 3:19:19 PM.    Final    Recent Labs    05/15/19 0806  WBC 30.8*  HGB 10.8*  HCT 34.2*  PLT 177   Recent Labs    05/15/19 0806  NA 136  K 4.6  CL 103  CO2 23  GLUCOSE 158*  BUN 20  CREATININE 0.63  CALCIUM 7.9*    Intake/Output Summary (Last 24 hours) at 05/16/2019 1037 Last data filed at 05/16/2019 1000 Gross per 24 hour  Intake 1770 ml  Output -  Net 1770 ml     Physical Exam: Vital Signs Blood pressure (!) 141/72, pulse 84, temperature 98.1 F (36.7 C), temperature source Oral, resp. rate 16, height 5\' 7"  (1.702 m), weight 65.6 kg, SpO2 97 %.  Constitutional:  Vitals, labs and imaging reviewed Awake, alert, appropriate, up on table in gym with PT; working on standing intermittently; wearing head scarf; NAD HENT: Round facies- stable Eyes:EOMs intact Neck:Normal range of motion.No tracheal deviationpresent. No thyromegalypresent.  Cardiovascular:Normal rateand regular rhythm.   Respiratory:CTA B/L KK:XFGH. NT, ND, (+)BS Musculoskeletal:Normal range of motion.   Losing a little bit of ankle DF on LLE- can get to 90 degrees, but couldn't get >90 degrees with passive ROM UE 5/5. RLE: 4-/5 to 4/5 prox to distal. LLE: HF,KE trace to 0/5, ADF tr/5, APF 1+/5. Sensation decreased to LT, pain and proprioception from left inguinal area to toes. DTR's absent. Skin:No rashnoted. No erythema.  Incision covered with honeycomb dressing (said it was replaced yesterday)- it has a little/minimal draining near top and near bottom- stable based on picture from yesterday.   Assessment/Plan: 1. 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  Physiatrist is providing close team supervision and 24 hour management of active medical problems listed below.  Physiatrist and rehab team continue to assess barriers to discharge/monitor patient progress toward functional and medical goals  Care Tool:  Bathing    Body parts bathed by patient: Right arm, Left arm, Chest, Abdomen, Front perineal area, Right upper leg, Left upper leg, Face   Body parts bathed by helper: Buttocks, Right lower leg, Left lower leg     Bathing assist Assist Level: Moderate Assistance - Patient 50 - 74%     Upper Body Dressing/Undressing Upper body dressing   What is the patient wearing?: Hospital gown only    Upper body assist Assist Level: Contact Guard/Touching assist    Lower Body Dressing/Undressing Lower body dressing      What is the patient wearing?: Pants     Lower body assist Assist for lower body dressing: Maximal Assistance - Patient 25 - 49%     Toileting  Toileting    Toileting assist Assist for toileting: Moderate Assistance - Patient 50 - 74%     Transfers Chair/bed transfer  Transfers assist     Chair/bed transfer assist level: Minimal Assistance -  Patient > 75%     Locomotion Ambulation   Ambulation assist      Assist level: Moderate Assistance - Patient 50 - 74% Assistive device: Walker-rolling Max distance: 30'   Walk 10 feet activity   Assist     Assist level: Moderate Assistance - Patient - 50 - 74% Assistive device: Walker-rolling   Walk 50 feet activity   Assist Walk 50 feet with 2 turns activity did not occur: Safety/medical concerns         Walk 150 feet activity   Assist Walk 150 feet activity did not occur: Safety/medical concerns         Walk 10 feet on uneven surface  activity   Assist Walk 10 feet on uneven surfaces activity did not occur: Safety/medical concerns         Wheelchair     Assist Will patient use wheelchair at discharge?: (TBD) Type of Wheelchair: Manual Wheelchair activity did not occur: Safety/medical concerns  Wheelchair assist level: Supervision/Verbal cueing Max wheelchair distance: 150    Wheelchair 50 feet with 2 turns activity    Assist    Wheelchair 50 feet with 2 turns activity did not occur: Safety/medical concerns   Assist Level: Supervision/Verbal cueing   Wheelchair 150 feet activity     Assist  Wheelchair 150 feet activity did not occur: Safety/medical concerns   Assist Level: Supervision/Verbal cueing   Blood pressure (!) 141/72, pulse 84, temperature 98.1 F (36.7 C), temperature source Oral, resp. rate 16, height 5\' 7"  (1.702 m), weight 65.6 kg, SpO2 97 %.  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  -admit to inpatient rehab.   -Back brace when out of bed. -Plan is to follow-up outpatient Dr. Earlie Server in regards to chemotherapy 2. Antithrombotics: -DVT/anticoagulation:SCDs.LE venous dopplers performed today and are negative -will not begin lovenox as platelets have been generally trending down (129k 8/30) -antiplatelet therapy: N/A 3. Pain Management:Neurontin 300 mg 3 times daily, Duragesic patch 75 mcg, Advil 400 mg 3 times daily oxycodone as needed 4. Mood:motivational support -antipsychotic agents: N/A 5. Neuropsych: This patientiscapable of making decisions on herown behalf. 6. Skin/Wound Care:Routine skin checks -honeycomb dressing in place currently.  9/2- looking stable 7. Fluids/Electrolytes/Nutrition:Routine in and outs with follow-up chemistriesupon admit 8. Acute blood loss anemia. Follow-up CBC 9. Leukocytosis secondary to steroids. Continue to monitor  9/2- WBC up to 30.8, from 28.3k- however tapering Decadron as of today- will con't to monitor- recheck Friday. NO signs of infection. 10. Urinary retention. Check PVR  9/2- pt had low PVRs- sounds like can void and empty.  9/3- per nursing, will con't to check, since have more precise/new bladder scanners. 11. Constipation in setting of Neurogenic bowel. Linzess daily. Adjust bowel program as needed  9/2- will con't Linzess since such a low dose and will push stool through colon, but reduce Miralax to daily and start bowel program with dig stim at night/evening.   9/3- encouraged pt to take  Linzess; can hold miralax if she wants.  12/ Dispo- 9/2-  team conference today- we are thinking 2 weeks, but don't have exact date as of yet since all evaluations not done yet.    LOS: 2 days A FACE TO FACE EVALUATION WAS PERFORMED  Rebecca Lucas 05/16/2019, 10:37 AM

## 2019-05-16 NOTE — Progress Notes (Signed)
Occupational Therapy Session Note  Patient Details  Name: Rebecca Lucas MRN: [id number] Date of Birth: 02-06-1956  Today's Date: 05/16/2019 OT Individual Time: 1045-1205 and 1400-1500 OT Individual Time Calculation (min): 80 min and 60 min   Short Term Goals: Week 1:  OT Short Term Goal 1 (Week 1): Pt will consistently complete functional transfers with min A using LRAD OT Short Term Goal 2 (Week 1): Pt will thread pants with supervision using AE PRN OT Short Term Goal 3 (Week 1): Pt will stand to complete 1 grooming task with CGA  in order to increase functional activity tolerance OT Short Term Goal 4 (Week 1): Pt will bathe with min A using AE PRN  Skilled Therapeutic Interventions/Progress Updates:    Session One: Pt seen for OT session focusing on ADL re-training, AE training, functional mobility and LE strengthening/control. Pt in side-ying upon arrival, agreeable to tx session. She reports being pre-medicated prior to tx session, no need for intervention. She transferred to sitting EOB with CGA, able to recall log rolling technique. Min-mod A squat pivot transfers completed throughout session with VCs for technique. Grooming tasks completed from w/c level with set-up. Pt provided education and demonstration for [place] of sock aid. She was able to independently recall technique for doffing socks using reacher and return demonstrates use of sock aid with min A for L foot placement.  She self propelled w/c throughout unit with supervision for UE strengthening/endurance.  Stand pivot transfer to therapy mat using RW with min-mod A, noted limited-no WBing through R LE. Education/demonstration provided regarding use of leg lifter to assist with sitting>supine and for bed mobility. Pt return demonstrated use with min A and VCs for technique/sequencing while maintaining back pre-cautions. From supine position, completed L LE stretching of hamstring and calf to pt tolerance. Attempted to have pt  self-stretch using leg lifter, however, due to weakness and poor control, pt unable to manage L LE for effective stretch from supine.  Transferred to sitting EOM, completed sit>stand from EOM using RW with min-mod A, therapist assisting for control at L knee and ankle to prevent knee buckling and ankle internal rotation. Mirror placed for visual feedback of movements of L LE and UB posture. Pt demonstrates some active movement in L quad, completing x3 sets of 5 mini squats with seated rest breaks btwn trials. Pt returned to room at end of session and requesting toileting task. Mod A stand pivot transfer to toilet with assist for controlled descent and total A for clothing management. Mod A stand pivot transfer to return to w/c using RW.  Pt left seated in recliner at end of session, all needs in reach.   Session Two: Pt seen for OT session focusing on functional standing balance/endurance and LE strengthening. Pt in side-lying upon arrival, agreeable to tx session. Complaints of lower back pain but willing to cont without intervention. Pt's husband provided new shoes, able to fit despite B LE edema, pt tolerating well.  She completed squat pivot transfers throughout session with min A and VCs for technique.  In therapy gym, completed stand pivot transfer with RW and min A. Completed sit>stand from EOM with RW, min A to power into standing. Mirror placed for visual feedback of LE control and foot placement. Completed dynamic standing task, alternating UE support on RW, balance maintained with min A overall.  Transitioned back to w/c and transferred onto NuStep. Completed on level 1 with assist to initiate use of L LE and pt able  to extend for remainder of ROM. Completed x3 minutes then  Transitioned to using all extremities with min A for R hip control x5 minutes.  Pt returned to room at end of session and transitioned back to bed. Pt left in side-lying with all needs in reach and bed alarm on.   Therapy  Documentation Precautions:  Precautions Precautions: Fall, Back Precaution Comments: reviewed spinal precautions Required Braces or Orthoses: Spinal Brace Spinal Brace: Lumbar corset, Applied in sitting position Restrictions Weight Bearing Restrictions: No    Therapy/Group: Individual Therapy  Shigeru Lampert L 05/16/2019, 6:59 AM

## 2019-05-16 NOTE — Progress Notes (Signed)
Physical Therapy Session Note  Patient Details  Name: Rebecca Lucas MRN: [id number] Date of Birth: 04/21/1956  Today's Date: 05/16/2019 PT Individual Time: 0800-0915 PT Individual Time Calculation (min): 75 min   Short Term Goals: Week 1:  PT Short Term Goal 1 (Week 1): Pt will be able to complete transfers with min A consistently PT Short Term Goal 2 (Week 1): Pt will ambulate x 50 ft with LRAD and mod A PT Short Term Goal 3 (Week 1): Pt will initiate w/c mobility  Skilled Therapeutic Interventions/Progress Updates:    Pt received supine in bed, agreeable to PT session. Pt reports 6/10 in back this AM, reports feeling sore from previous day sessions. Pt is premedicated prior to start of therapy session. Supine to sit with min A. Squat pivot transfer bed to bedside commode with mod A. Dependent for clothing management. Sit to stand to RW with max A, dependent to pull up pants in standing. Stand pivot transfer BSC to bed with RW and mod A. Squat pivot transfer bed to w/c with mod A, v/c for safe hand placement with transfer. Manual w/c propulsion x 150 ft with use of BUE and Supervision. Adjusted LLE ELR for improved pt fit. Squat pivot transfer w/c to/from mat table with min to mod A. Sit to supine mod A for BLE management. Supine LLE AAROM therex: heel slides, SKFO, SAQ. Pt exhibits significant weakness in LLE and is only able to perform trace movements against gravity. Supine to sit with mod A for LE management. Sit to stand x 5 reps from elevated mat table to RW with mod A with focus on anterior weight shift with transfer and LE muscle activation. Pt requires manual cueing for L knee control in stance. Standing mini-squats x 5 reps with RW and min A for balance. Squat pivot back to w/c with mod A. Seated L hamstring stretch 3 x 60 sec each. Applied TED hose to BLE for edema management, pt exhibits L>R edema in LLE this AM. Pt requests to return to bed at end of session. Squat pivot transfer w/c to  bed with mod A. Sit to supine mod A. Supine to sidelying min A. Pt left sidelying in bed with needs in reach at end of session.  Therapy Documentation Precautions:  Precautions Precautions: Fall, Back Precaution Comments: reviewed spinal precautions Required Braces or Orthoses: Spinal Brace Spinal Brace: Lumbar corset, Applied in sitting position Restrictions Weight Bearing Restrictions: No    Therapy/Group: Individual Therapy   Excell Seltzer, PT, DPT  05/16/2019, 12:44 PM

## 2019-05-16 NOTE — Progress Notes (Signed)
Patient declined linzess this AM, wants to talk with doctor in AM, about need to continue med. Feels good about 2 formed stools. Refused scheduled tylenol this AM. Rebecca Lucas A

## 2019-05-17 ENCOUNTER — Telehealth: Payer: Self-pay | Admitting: Internal Medicine

## 2019-05-17 ENCOUNTER — Inpatient Hospital Stay (HOSPITAL_COMMUNITY): Payer: 59

## 2019-05-17 ENCOUNTER — Inpatient Hospital Stay (HOSPITAL_COMMUNITY): Payer: 59 | Admitting: Physical Therapy

## 2019-05-17 ENCOUNTER — Inpatient Hospital Stay (HOSPITAL_COMMUNITY): Payer: 59 | Admitting: Occupational Therapy

## 2019-05-17 LAB — CBC WITH DIFFERENTIAL/PLATELET
Abs Immature Granulocytes: 4.06 10*3/uL — ABNORMAL HIGH (ref 0.00–0.07)
Basophils Absolute: 0.1 10*3/uL (ref 0.0–0.1)
Basophils Relative: 0 %
Eosinophils Absolute: 0 10*3/uL (ref 0.0–0.5)
Eosinophils Relative: 0 %
HCT: 31.7 % — ABNORMAL LOW (ref 36.0–46.0)
Hemoglobin: 10.1 g/dL — ABNORMAL LOW (ref 12.0–15.0)
Immature Granulocytes: 15 %
Lymphocytes Relative: 1 %
Lymphs Abs: 0.2 10*3/uL — ABNORMAL LOW (ref 0.7–4.0)
MCH: 31.6 pg (ref 26.0–34.0)
MCHC: 31.9 g/dL (ref 30.0–36.0)
MCV: 99.1 fL (ref 80.0–100.0)
Monocytes Absolute: 0.9 10*3/uL (ref 0.1–1.0)
Monocytes Relative: 3 %
Neutro Abs: 21.7 10*3/uL — ABNORMAL HIGH (ref 1.7–7.7)
Neutrophils Relative %: 81 %
Platelets: 159 10*3/uL (ref 150–400)
RBC: 3.2 MIL/uL — ABNORMAL LOW (ref 3.87–5.11)
RDW: 16.7 % — ABNORMAL HIGH (ref 11.5–15.5)
WBC: 26.9 10*3/uL — ABNORMAL HIGH (ref 4.0–10.5)
nRBC: 0.2 % (ref 0.0–0.2)

## 2019-05-17 LAB — BASIC METABOLIC PANEL
Anion gap: 7 (ref 5–15)
BUN: 19 mg/dL (ref 8–23)
CO2: 25 mmol/L (ref 22–32)
Calcium: 8.1 mg/dL — ABNORMAL LOW (ref 8.9–10.3)
Chloride: 104 mmol/L (ref 98–111)
Creatinine, Ser: 0.66 mg/dL (ref 0.44–1.00)
GFR calc Af Amer: >60 mL/min (ref >60)
GFR calc non Af Amer: >60 mL/min (ref >60)
Glucose, Bld: 119 mg/dL — ABNORMAL HIGH (ref 70–99)
Potassium: 4.6 mmol/L (ref 3.5–5.1)
Sodium: 136 mmol/L (ref 135–145)

## 2019-05-17 MED ORDER — CHLORHEXIDINE GLUCONATE CLOTH 2 % EX PADS
6.0000 | MEDICATED_PAD | Freq: Every day | CUTANEOUS | Status: DC
  Administered 2019-05-17 – 2019-06-07 (×17): 6 via TOPICAL

## 2019-05-17 NOTE — Telephone Encounter (Signed)
R/s appt per 9/1 sch message - unable to reach pt . Left message with appt date and time   

## 2019-05-17 NOTE — Progress Notes (Signed)
[place] PHYSICAL MEDICINE & REHABILITATION PROGRESS NOTE   Subjective/Complaints:  Pt reports-feeling good today- took Linzess but held miralax- can feel pressure building to poop. Didn't have any bowel accidents in last 24 hours.  Tried carbon fiber AFO this AM- went well.    Objective:   No results found. Recent Labs    05/15/19 0806 05/17/19 0411  WBC 30.8* 26.9*  HGB 10.8* 10.1*  HCT 34.2* 31.7*  PLT 177 159   Recent Labs    05/15/19 0806 05/17/19 0411  NA 136 136  K 4.6 4.6  CL 103 104  CO2 23 25  GLUCOSE 158* 119*  BUN 20 19  CREATININE 0.63 0.66  CALCIUM 7.9* 8.1*    Intake/Output Summary (Last 24 hours) at 05/17/2019 1922 Last data filed at 05/17/2019 1823 Gross per 24 hour  Intake 700 ml  Output -  Net 700 ml     Physical Exam: Vital Signs Blood pressure 126/67, pulse 76, temperature 98.3 F (36.8 C), temperature source Oral, resp. rate 16, height 5\' 7"  (1.702 m), weight 66 kg, SpO2 100 %.  Constitutional:  Vitals, labs and imaging reviewed Awake, alert, appropriate, sitting in w/c, in gym with PT; looking at skin after wearing AFO on L; wearing head scarf; NAD HENT: Round facies- stable Eyes:EOMs intact Neck:Normal range of motion.No tracheal deviationpresent. No thyromegalypresent.  Cardiovascular:Normal rateand regular rhythm.   Respiratory:CTA B/L YQ:IHKV. NT, ND, (+)BS Musculoskeletal:Normal range of motion.  Losing a little bit of ankle DF on LLE- can get to 90 degrees, but couldn't get >90 degrees with passive ROM UE 5/5. RLE: 4-/5 to 4/5 prox to distal. LLE: HF,KE trace to 0/5, ADF tr/5, APF 1+/5. Sensation decreased to LT, pain and proprioception from left inguinal area to toes. DTR's absent. Skin:No rashnoted. No erythema.  Incision covered with honeycomb dressing (said it was replaced yesterday)- it has a little/minimal draining near top and near bottom- stable based on picture from yesterday.   Assessment/Plan: 1.  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  Physiatrist is providing close team supervision and 24 hour management of active medical problems listed below.  Physiatrist and rehab team continue to assess barriers to discharge/monitor patient progress toward functional and medical goals  Care Tool:  Bathing  Bathing activity did not occur: Refused Body parts bathed by patient: Right arm, Left arm, Chest, Abdomen, Front perineal area, Right upper leg, Left upper leg, Face, Buttocks, Right lower leg, Left lower leg   Body parts bathed by helper: Buttocks, Right lower leg, Left lower leg     Bathing assist Assist Level: Minimal Assistance - Patient > 75%     Upper Body Dressing/Undressing Upper body dressing   What is the patient wearing?: Pull over shirt    Upper body assist Assist Level: Supervision/Verbal cueing    Lower Body Dressing/Undressing Lower body dressing      What is the patient wearing?: Underwear/pull up, Pants     Lower body assist Assist for lower body dressing: Moderate Assistance - Patient 50 - 74%     Toileting Toileting    Toileting assist Assist for toileting: Minimal Assistance - Patient > 75%     Transfers Chair/bed transfer  Transfers assist     Chair/bed transfer assist level: Minimal Assistance - Patient > 75%  Locomotion Ambulation   Ambulation assist      Assist level: Moderate Assistance - Patient 50 - 74% Assistive device: Walker-rolling(AFO) Max distance: 5   Walk 10 feet activity   Assist     Assist level: Minimal Assistance - Patient > 75% Assistive device: Walker-rolling(L AFO, shoe cover)   Walk 50 feet activity   Assist Walk 50 feet with 2 turns activity did not occur: Safety/medical concerns          Walk 150 feet activity   Assist Walk 150 feet activity did not occur: Safety/medical concerns         Walk 10 feet on uneven surface  activity   Assist Walk 10 feet on uneven surfaces activity did not occur: Safety/medical concerns         Wheelchair     Assist Will patient use wheelchair at discharge?: Yes Type of Wheelchair: Manual Wheelchair activity did not occur: Safety/medical concerns  Wheelchair assist level: Independent Max wheelchair distance: 150    Wheelchair 50 feet with 2 turns activity    Assist    Wheelchair 50 feet with 2 turns activity did not occur: Safety/medical concerns   Assist Level: Independent   Wheelchair 150 feet activity     Assist  Wheelchair 150 feet activity did not occur: Safety/medical concerns   Assist Level: Independent   Blood pressure 126/67, pulse 76, temperature 98.3 F (36.8 C), temperature source Oral, resp. rate 16, height 5\' 7"  (1.702 m), weight 66 kg, SpO2 100 %.  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  -admit to inpatient rehab.  -Back brace when out of bed. -Plan is to follow-up outpatient Dr. Earlie Server in regards to chemotherapy 2. Antithrombotics: -DVT/anticoagulation:SCDs.LE venous dopplers performed today and are negative -will not begin lovenox as platelets have been generally trending down (129k 8/30) -antiplatelet therapy: N/A 3. Pain Management:Neurontin 300 mg 3 times daily, Duragesic patch 75 mcg, Advil 400 mg 3 times daily oxycodone as needed 4.  Mood:motivational support -antipsychotic agents: N/A 5. Neuropsych: This patientiscapable of making decisions on herown behalf. 6. Skin/Wound Care:Routine skin checks -honeycomb dressing in place currently.  9/2- looking stable  9/4- skin looking stable- no skin breakdown from AFO either/no redness 7. Fluids/Electrolytes/Nutrition:Routine in and outs with follow-up chemistriesupon admit 8. Acute blood loss anemia. Follow-up CBC  9/4- Hb down to 10.1 from 10.8- will con't to follow 9. Leukocytosis secondary to steroids. Continue to monitor  9/2- WBC up to 30.8, from 28.3k- however tapering Decadron as of today- will con't to monitor- recheck Friday. NO signs of infection.  9/4- WBC down to 26.9k- afebrile 10. Urinary retention. Check PVR  9/2- pt had low PVRs- sounds like can void and empty.  9/3- per nursing, will con't to check, since have more precise/new bladder scanners. 11. Constipation in setting of Neurogenic bowel. Linzess daily. Adjust bowel program as needed  9/2- will con't Linzess since such a low dose and will push stool through colon, but reduce Miralax to daily and start bowel program with dig stim at night/evening.   9/3- encouraged pt to take  Linzess; can hold miralax if she wants.  9/4- no bowel incontinence overnight.  12/ Dispo- 9/2- team conference today- we are thinking 2 weeks, but don't  have exact date as of yet since all evaluations not done yet.    LOS: 3 days A FACE TO FACE EVALUATION WAS PERFORMED  Anh Bigos 05/17/2019, 7:22 PM

## 2019-05-17 NOTE — IPOC Note (Signed)
Overall Plan of Care [facility]) Patient Details Name: Rebecca Lucas MRN: [id number] DOB: 1956/03/11  Admitting Diagnosis: Cauda equina syndrome [facility]  Hospital Problems: Principal Problem:   Cauda equina syndrome ([place]) Active Problems:   Foot drop, left   Neurogenic bowel   Bandemia     Functional Problem List: Nursing    PT Endurance, Motor, Pain, Safety, Sensory  OT Balance, Safety, Sensory, Edema, Endurance, Motor, Pain, Perception  SLP    TR         Basic ADL's: OT Grooming, Bathing, Dressing, Toileting     Advanced  ADL's: OT       Transfers: PT Bed Mobility, Bed to Chair, Car, Furniture, Floor  OT Toilet, Metallurgist: PT Ambulation, Emergency planning/management officer, Stairs     Additional Impairments: OT None  SLP        TR      Anticipated Outcomes Item Anticipated Outcome  Self Feeding Indep  Swallowing      Basic self-care  Supervision/set-up  Event organiser with RW  Communication     Cognition     Pain     Safety/Judgment      Therapy Plan: PT Intensity: Minimum of 1-2 x/day ,45 to 90 minutes PT Frequency: 5 out of 7 days PT Duration Estimated Length of Stay: 14-16 days OT Intensity: Minimum of 1-2 x/day, 45 to 90 minutes OT Frequency: 5 out of 7 days OT Duration/Estimated Length of Stay: 10-12 days     Due to the current state of emergency, patients may not be receiving their 3-hours of Medicare-mandated therapy.   Team Interventions: Nursing Interventions    PT interventions Ambulation/gait training, Community reintegration, Discharge planning, Disease management/prevention, DME/adaptive equipment instruction, Functional mobility training, Neuromuscular re-education, Pain management, Patient/family education, Psychosocial support, Splinting/orthotics, Stair training, Therapeutic Activities, Therapeutic Exercise, UE/LE  Strength taining/ROM, UE/LE Coordination activities, Wheelchair propulsion/positioning  OT Interventions Balance/vestibular training, Discharge planning, Pain management, Self Care/advanced ADL retraining, Therapeutic Activities, UE/LE Coordination activities, Therapeutic Exercise, Skin care/wound managment, Patient/family education, Functional mobility training, Community reintegration, DME/adaptive equipment instruction, Neuromuscular re-education, Psychosocial support, UE/LE Strength taining/ROM, Splinting/orthotics, Wheelchair propulsion/positioning  SLP Interventions    TR Interventions    SW/CM Interventions Discharge Planning, Psychosocial Support, Patient/Family Education   Barriers to Discharge MD  Medical stability  Nursing      PT Medical stability, Pending chemo/radiation    OT Neurogenic Bowel & Bladder Pt currently receiving chemotherapy  SLP      SW       Team Discharge Planning: Destination: PT-Home ,OT- Home , SLP-  Projected Follow-up: PT-Home health PT, OT-  Home health OT, SLP-  Projected Equipment Needs: PT-Rolling walker with 5" wheels, OT- 3 in 1 bedside comode, SLP-  Equipment Details: PT-owns w/c and rollator, OT-  Patient/family involved in discharge planning: PT- Patient,  OT-Patient, SLP-   MD ELOS: 12-14 days Medical Rehab Prognosis:  Excellent Assessment: The patient has been admitted for CIR therapies with the diagnosis of cauda equina/conus syndrome. The team will be addressing functional mobility, strength, stamina, balance, safety, adaptive techniques and equipment, self-care, bowel and bladder mgt, patient and caregiver education, NMR, orthotics, pain control. Goals have been set at supervision for self-care and mobility.   Due to the current state of emergency, patients may not be receiving their 3 hours per day of Medicare-mandated therapy.    Celesta Gentile.  Naaman Plummer, MD, [facility]      See Team Conference Notes for weekly updates to the plan of care

## 2019-05-17 NOTE — Patient Care Conference (Signed)
Inpatient RehabilitationTeam Conference and Plan of Care Update Date: 05/15/2019   Time: 9:45 AM    Patient Name: Rebecca Lucas      Medical Record Number: [id number]  Date of Birth: 1956-04-01 Sex: Female         Room/Bed: 4W09C/4W09C-01 Payor Info: Payor: [employer] / Plan: [place] [employer] / Product Type: *No Product type* /    Admitting Diagnosis: 3. SCI Team  Other NTSC dysf; Cauda equina syndrome   Admit Date/Time:  05/14/2019  3:58 PM Admission Comments: No comment available   Primary Diagnosis:  <principal problem not specified> Principal Problem: <principal problem not specified>  Patient Active Problem List   Diagnosis Date Noted  . Bandemia   . Foot drop, left   . Neurogenic bowel   . Cauda equina syndrome ([place]) 05/14/2019  . Urinary retention   . Surgery, elective   . 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 Acute urinary retention 05/07/2019  . Elevated LFTs 05/07/2019  . 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 Oral thrush 04/04/2018  . Goals of care, counseling/discussion 04/04/2018  . Encounter for antineoplastic chemotherapy 04/04/2018  . Encounter for antineoplastic immunotherapy 04/04/2018  . 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 Metastatic malignant neoplasm ([place]) 03/06/2018  . Primary cancer of right upper lobe of lung ([place]) 03/06/2018  . Bone metastasis ([place]) 03/06/2018    Expected Discharge Date: Expected Discharge Date: (2 weeks)  Team Members Present: Physician leading conference: Dr. Courtney Heys Social Worker Present: Lennart Pall, LCSW Nurse Present: Rayetta Pigg, RN PT Present: Excell Seltzer, PT OT Present: [name] Rounds, OT SLP Present: Weston Anna, SLP PPS Coordinator present : Gunnar Fusi, SLP     Current Status/Progress Goal Weekly Team Focus  Medical   Pt's WBC is 30.8k up from 28.3k- will wean decadron; dx'd with  neurogenic bowel- will put on bowel program; still having pain- oxycodone  to wean decadron and reduce WBC; get on bowel program to reduce bowel incontinence  as above   Bowel/Bladder   Continent of bowel and bladder. LBM 9/03  remain continent. maintain regular pattern.  assess bowel pattern per protocol. medications as needed.   Swallow/Nutrition/ Hydration             ADL's   Mod A functional transfers; max A LB bathing/dressing; set-up UB bathing/dressing and grooming tasks from w/c level, max A toileting  Supervision overall  ADL re-training, neuro re-ed, functional transfers, functional standing balance/endurance   Mobility   mod A overall, gait x 30' with RW mod A  Supervision to min A overall  PT evaluation   Communication             Safety/Cognition/ Behavioral Observations            Pain   Complaint of pain on lower back. Maintained on PRN oxycodone and scheduled tylenol.  Pain level less than 4 on scale of 0-10  assess pain per protocol. medicate pt as needed and prior to therapy.   Skin   Incision to back. honeycomb dressing in place. Dressing with old drainage, otherwise intact. no new draiange observed.  Maintain skin integrity.  Assess skin per protocol. assess incision site for new draiange or s/s of infetion.    Rehab Goals Patient on target to meet rehab goals: Yes *See Care Plan and progress notes for long and short-term goals.  Barriers to Discharge  Current Status/Progress Possible Resolutions Date Resolved   Physician    Medical stability;Neurogenic Bowel & Bladder;Incontinence;Pending chemo/radiation  n/a  min-mod assist- 30 ft RW mod assist- min assist bed mobility  can't do estim due to cancer dx on LLE- will con't strengthening/ADLs, and bracing as required      Nursing                  PT  Medical stability;Pending chemo/radiation                 OT Neurogenic Bowel & Bladder  Pt currently receiving chemotherapy             SLP                 SW                Discharge Planning/Teaching Needs:  Pt to d/c home with spouse able to provide 24/7 assistance.  Teaching to be planned closer to d/c.   Team Discussion:  New eval;  Mets to spinal cord;  Chemo stopped for the next few weeks.  Neurogenic bladder.  MD ordered for b/b program at night.  OT eval pending. Min assist overall with PT/ amb ~ 30' with mod assist.  May need bracing.  Anticipate ELS ~ 2 weeks  Revisions to Treatment Plan:  NA    Continued Need for Acute Rehabilitation Level of Care: The patient requires daily medical management by a physician with specialized training in physical medicine and rehabilitation for the following conditions: Daily direction of a multidisciplinary physical rehabilitation program to ensure safe treatment while eliciting the highest outcome that is of practical value to the patient.: Yes Daily medical management of patient stability for increased activity during participation in an intensive rehabilitation regime.: Yes Daily analysis of laboratory values and/or radiology reports with any subsequent need for medication adjustment of medical intervention for : Post surgical problems;Neurological problems;Mood/behavior problems   I attest that I was present, lead the team conference, and concur with the assessment and plan of the team.   Myleah Cavendish 05/17/2019, 11:19 AM    Team conference was held via web/ teleconference due to [place] - 19

## 2019-05-17 NOTE — Plan of Care (Signed)
  Problem: Consults Goal: RH SPINAL CORD INJURY PATIENT EDUCATION Description:  See Patient Education module for education specifics.  Outcome: Progressing Goal: Skin Care Protocol Initiated - if Braden Score 18 or less Description: If consults are not indicated, leave blank or document N/A Outcome: Progressing   Problem: SCI BOWEL ELIMINATION Goal: RH STG MANAGE BOWEL WITH ASSISTANCE Description: STG Manage Bowel with mod I Assistance. Outcome: Progressing Goal: RH STG SCI MANAGE BOWEL WITH MEDICATION WITH ASSISTANCE Description: STG SCI Manage bowel with medication with mod I assistance. Outcome: Progressing Goal: RH STG SCI MANAGE BOWEL PROGRAM W/ASSIST OR AS APPROPRIATE Description: STG SCI Manage bowel program mod I assist or as appropriate. Outcome: Progressing   Problem: RH SKIN INTEGRITY Goal: RH STG MAINTAIN SKIN INTEGRITY WITH ASSISTANCE Description: STG Maintain Skin Integrity With mod I Assistance. Outcome: Progressing   Problem: RH PAIN MANAGEMENT Goal: RH STG PAIN MANAGED AT OR BELOW PT'S PAIN GOAL Description: < 4 Outcome: Progressing

## 2019-05-17 NOTE — Progress Notes (Signed)
Physical Therapy Session Note  Patient Details  Name: Rebecca Lucas MRN: [id number] Date of Birth: 17-Sep-1955  Today's Date: 05/17/2019 PT Individual Time: 9211-9417 PT Individual Time Calculation (min): 25 min   Short Term Goals: Week 1:  PT Short Term Goal 1 (Week 1): Pt will be able to complete transfers with min A consistently PT Short Term Goal 2 (Week 1): Pt will ambulate x 50 ft with LRAD and mod A PT Short Term Goal 3 (Week 1): Pt will initiate w/c mobility  Skilled Therapeutic Interventions/Progress Updates:   Pt received sitting in WC and agreeable to PT. Pt transported to rehab gym. PT treatment focused on improved WB through LLE with AFO in place. Sit<>stand in parallel bars x 4 with min-mod assist. Reciprocal stepping in parallel bars x 5 BLE and lateral weight shifts R and L x 8 bil. Min-mod assist from PT to improve terminal knee extension on the L. Gait training in parallel bars x 77f with min assist for LLE advancement. Gait training also performed in RW 2 x 5 ft with mod assist and LAFO; moderate cues weight shifting, use of AD in turns and advancement of LLE with decreased vaulting on the RLE; no L knee instability noted. Patient returned to room and left sitting in recliner with call bell in reach and all needs met.         Therapy Documentation Precautions:  Precautions Precautions: Fall, Back Precaution Comments: reviewed spinal precautions Required Braces or Orthoses: Spinal Brace Spinal Brace: Lumbar corset, Applied in sitting position Restrictions Weight Bearing Restrictions: No Pain: 2/10 LLE swollen (ambulation increased)   Therapy/Group: Individual Therapy  ALorie Phenix9/12/2018, 5:48 PM

## 2019-05-17 NOTE — Progress Notes (Signed)
Occupational Therapy Session Note  Patient Details  Name: Rebecca Lucas MRN: [id number] Date of Birth: [birth year]  Today's Date: 05/17/2019 OT Individual Time: 1100-1156 OT Individual Time Calculation (min): 56 min    Short Term Goals: Week 1:  OT Short Term Goal 1 (Week 1): Pt will consistently complete functional transfers with min A using LRAD OT Short Term Goal 2 (Week 1): Pt will thread pants with supervision using AE PRN OT Short Term Goal 3 (Week 1): Pt will stand to complete 1 grooming task with CGA  in order to increase functional activity tolerance OT Short Term Goal 4 (Week 1): Pt will bathe with min A using AE PRN  Skilled Therapeutic Interventions/Progress Updates:    1:1. Pt received in bed agreeable to OT. Pt requesting to shower, however with port accessed, RN requesting sink bath. Pt completes supine>sitting EOB with superviison using log roll technique. Pt able to don LSO with set up for all mobility. Pt completes squat pivot transfer EOB>w/c>recliner with CGA and MIN A sit to stand at sink during bathing and dressing with min tactile input at LLE. Pt completes bathing, dressing and grooming at sink with MIN A for LB ADLs management of LLE/standing balance and set up of UB ADLs. Pt requires min VC for AE use during ADLs. Exited session with pt seated in recliner, chair pad alarm in place and call light in reach  Therapy Documentation Precautions:  Precautions Precautions: Fall, Back Precaution Comments: reviewed spinal precautions Required Braces or Orthoses: Spinal Brace Spinal Brace: Lumbar corset, Applied in sitting position Restrictions Weight Bearing Restrictions: No General:   Vital Signs:   Pain: Pain Assessment Pain Scale: 0-10 Pain Score: 5  Pain Type: Acute pain;Surgical pain Pain Location: Back Pain Orientation: Lower Pain Descriptors / Indicators: Throbbing Pain Frequency: Intermittent Pain Onset: On-going Patients Stated Pain Goal: 4 Pain  Intervention(s): Medication (See eMAR)(oxycodone) Multiple Pain Sites: No ADL:   Vision   Perception    Praxis   Exercises:   Other Treatments:     Therapy/Group: Individual Therapy  Tonny Branch 05/17/2019, 11:58 AM

## 2019-05-17 NOTE — Progress Notes (Addendum)
Physical Therapy Session Note  Patient Details  Name: Rebecca Lucas MRN: [id number] Date of Birth: 1956/01/21  Today's Date: 05/17/2019 PT Individual Time: 7893-8101 PT Individual Time Calculation (min): 69 min   Short Term Goals: Week 1:  PT Short Term Goal 1 (Week 1): Pt will be able to complete transfers with min A consistently PT Short Term Goal 2 (Week 1): Pt will ambulate x 50 ft with LRAD and mod A PT Short Term Goal 3 (Week 1): Pt will initiate w/c mobility  Skilled Therapeutic Interventions/Progress Updates:  Pt received in bed reporting unrated low back incision pain but reports she's premedicated & agreeable to tx.  Pt very pleasant & motivated to participate. Pt transferred supine>roll L>sideying with HOB elevated, bed rails, and min assist with pt using BUE to assist LLE off EOB. Pt transfers bed>w/c with squat pivot and mod assist. Pt propels w/c room>gym and gym>dayroom with BUE & mod I. Pt transfers sit<>stand and stand pivot with RW & mod assist with difficulty transitioning UE from pushing off stable surface to RW 2/2 BLE weakness, and difficulty advancing LLE requiring therapist assistance to do so. In gym, pt ambulates 10 ft + 12 ft + 25 ft with RW & min assist with L GRAFO and shoe cover with increased ease of advancing LLE but significantly impaired neuromuscular control affecting LLE placement. During gait during stance phase on LLE pt relies heavily on BUE on RW and L heel comes off floor as pt has difficulty weight bearing through extremity 2/2 impaired sensation & proprioception. After trialing AFO pt without any redness noted from use of brace but some redness noted on distal dorsal aspect of L foot 2/2 ted hose with therapist educating pt to keep wrinkles out of stockings throughout the day. Pt utilized nu-step on level 3 x 10 minutes with BLE and using UE only to assist as needed, with focus on LLE strengthening & control - pt with impaired ability maintaining L knee  neutral alignment, requiring UE assistance at times. Returned pt to room & pt ambulates ~5 ft with RW & mod assist with ongoing assistance to advance & place LLE. Pt transferred to sitting on [facility] & left with call bell in reach, NT aware of pt's location.   Addendum: Assisted pt with donning paper scrub shirt & back brace while sitting EOB for time management.  Therapy Documentation Precautions:  Precautions Precautions: Fall, Back Precaution Comments: reviewed spinal precautions Required Braces or Orthoses: Spinal Brace Spinal Brace: Lumbar corset, Applied in sitting position Restrictions Weight Bearing Restrictions: No     Therapy/Group: Individual Therapy  Waunita Schooner 05/17/2019, 9:45 AM

## 2019-05-17 NOTE — Progress Notes (Signed)
Occupational Therapy Session Note  Patient Details  Name: Rebecca Lucas MRN: [id number] Date of Birth: 03-Oct-1955  Today's Date: 05/17/2019 OT Individual Time: 1451-1540 OT Individual Time Calculation (min): 49 min    Short Term Goals: Week 1:  OT Short Term Goal 1 (Week 1): Pt will consistently complete functional transfers with min A using LRAD OT Short Term Goal 2 (Week 1): Pt will thread pants with supervision using AE PRN OT Short Term Goal 3 (Week 1): Pt will stand to complete 1 grooming task with CGA  in order to increase functional activity tolerance OT Short Term Goal 4 (Week 1): Pt will bathe with min A using AE PRN     Skilled Therapeutic Interventions/Progress Updates:    Pt greeted in bed with ADL needs met. She requested to work on her Lt leg. Mod A for supine<sit with vcs for logroll technique. Pt able to don her LSO while sitting EOB with setup. Mod A for squat pivot<w/c. Had pt use her leg lifter to perform B hamstring stretches, 2 sets for 1 minute holds. Assist required for R LE positioning. Vcs for leaning back during this time for precaution adherence. Pt then completed B heel slide exercises focusing on functional mobility/ROM of R LE. Max A required to complete on the Rt side. Worked on improving Lt knee stability via mini squats when standing at the sink. Manual facilitation for increasing Lt weightshift and also to prevent buckling of Lt knee. Able to hold squatted position for 2 second holds. She completed 2 sets of 15 reps. Steady assist for sit<stands and for squat pivot<recliner. Donned her shoes including Lt AFO with Total A in prep for PT session. Pt was repositioned for comfort and left with all needs within reach.   3/3 recall of back precautions during tx   Therapy Documentation Precautions:  Precautions Precautions: Fall, Back Precaution Comments: reviewed spinal precautions Required Braces or Orthoses: Spinal Brace Spinal Brace: Lumbar corset, Applied  in sitting position Restrictions Weight Bearing Restrictions: No \Pain: RN in to provide pain medicine during session    ADL: :     Therapy/Group: Individual Therapy  Natajah Derderian A Cathern Tahir 05/17/2019, 4:14 PM

## 2019-05-18 ENCOUNTER — Ambulatory Visit: Payer: 59

## 2019-05-18 MED ORDER — POLYETHYLENE GLYCOL 3350 17 G PO PACK
17.0000 g | PACK | Freq: Every day | ORAL | Status: DC | PRN
  Administered 2019-05-25 – 2019-05-26 (×2): 17 g via ORAL
  Filled 2019-05-18 (×2): qty 1

## 2019-05-18 MED ORDER — IBUPROFEN 400 MG PO TABS
400.0000 mg | ORAL_TABLET | Freq: Three times a day (TID) | ORAL | Status: DC | PRN
  Administered 2019-05-24 – 2019-05-29 (×5): 400 mg via ORAL
  Filled 2019-05-18 (×5): qty 1

## 2019-05-18 NOTE — Progress Notes (Signed)
[facility] PHYSICAL MEDICINE & REHABILITATION PROGRESS NOTE   Subjective/Complaints:  Pt reports had a normal formed BM this AM- "no more squirts"- continuing the Linzess- wants miralax and ibuprofen made prn. Has a day off therapy today- feels "weird"   Objective:   No results found. Recent Labs    05/17/19 0411  WBC 26.9*  HGB 10.1*  HCT 31.7*  PLT 159   Recent Labs    05/17/19 0411  NA 136  K 4.6  CL 104  CO2 25  GLUCOSE 119*  BUN 19  CREATININE 0.66  CALCIUM 8.1*    Intake/Output Summary (Last 24 hours) at 05/18/2019 1059 Last data filed at 05/18/2019 0800 Gross per 24 hour  Intake 700 ml  Output -  Net 700 ml     Physical Exam: Vital Signs Blood pressure 116/66, pulse 70, temperature 98.6 F (37 C), temperature source Oral, resp. rate 16, height 5\' 7"  (1.702 m), weight 59.1 kg, SpO2 100 %.  Constitutional:  Vitals, labs and imaging reviewed Awake alert, appropriate, sitting up in bed wearing head scarf; bright affect, NAD HENT: Round facies- stable Eyes:EOMs intact Neck:Normal range of motion.No tracheal deviationpresent. No thyromegalypresent.  Cardiovascular:Normal rateand regular rhythm.   Respiratory:CTA B/L DG:UYQI. NT, ND, (+)BS Musculoskeletal:Normal range of motion.  Losing a little bit of ankle DF on LLE- can get to 90 degrees, but couldn't get >90 degrees with passive ROM UE 5/5. RLE: 4-/5 to 4/5 prox to distal. LLE: HF,KE trace to 0/5, ADF tr/5, APF 1+/5. Sensation decreased to LT, pain and proprioception from left inguinal area to toes. DTR's absent. Skin:No rashnoted. No erythema.  Incision covered with honeycomb dressing (said it was replaced yesterday)- it has a little/minimal draining near top and near bottom- stable based on picture from yesterday.   Assessment/Plan: 1. 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  Physiatrist is providing close team supervision and 24 hour management of active medical problems listed below.  Physiatrist and rehab team continue to assess barriers to discharge/monitor patient progress toward functional and medical goals  Care Tool:  Bathing  Bathing activity did not occur: Refused Body parts bathed by patient: Right arm, Left arm, Chest, Abdomen, Front perineal area, Right upper leg, Left upper leg, Face, Buttocks, Right lower leg, Left lower leg   Body parts bathed by helper: Buttocks, Right lower leg, Left lower leg     Bathing assist Assist Level: Minimal Assistance - Patient > 75%     Upper Body Dressing/Undressing Upper body dressing   What is the patient wearing?: Hospital gown only    Upper body assist Assist Level: Minimal Assistance - Patient > 75%    Lower Body Dressing/Undressing Lower body dressing      What is the patient wearing?: Underwear/pull up     Lower body assist Assist for lower body dressing: Moderate Assistance - Patient 50 - 74%     Toileting Toileting    Toileting assist Assist for toileting: Minimal Assistance - Patient > 75%     Transfers Chair/bed transfer  Transfers assist     Chair/bed transfer assist level: Moderate Assistance - Patient 50 - 74%     Locomotion Ambulation   Ambulation assist      Assist level: Moderate Assistance - Patient 50 - 74% Assistive device: Walker-rolling(AFO) Max distance:  5   Walk 10 feet activity   Assist     Assist level: Minimal Assistance - Patient > 75% Assistive device: Walker-rolling(L AFO, shoe cover)   Walk 50 feet activity   Assist Walk 50 feet with 2 turns activity did not occur: Safety/medical concerns         Walk 150 feet activity   Assist Walk 150 feet activity did not occur: Safety/medical concerns         Walk 10 feet on  uneven surface  activity   Assist Walk 10 feet on uneven surfaces activity did not occur: Safety/medical concerns         Wheelchair     Assist Will patient use wheelchair at discharge?: Yes Type of Wheelchair: Manual Wheelchair activity did not occur: Safety/medical concerns  Wheelchair assist level: Independent Max wheelchair distance: 150    Wheelchair 50 feet with 2 turns activity    Assist    Wheelchair 50 feet with 2 turns activity did not occur: Safety/medical concerns   Assist Level: Independent   Wheelchair 150 feet activity     Assist  Wheelchair 150 feet activity did not occur: Safety/medical concerns   Assist Level: Independent   Blood pressure 116/66, pulse 70, temperature 98.6 F (37 C), temperature source Oral, resp. rate 16, height 5\' 7"  (1.702 m), weight 59.1 kg, SpO2 100 %.  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  -admit to inpatient rehab.  -Back brace when out of bed. -Plan is to follow-up outpatient Dr. Earlie Server in regards to chemotherapy 2. Antithrombotics: -DVT/anticoagulation:SCDs.LE venous dopplers performed today and are negative -will not begin lovenox as platelets have been generally trending down (129k 8/30) -antiplatelet therapy: N/A 3. Pain Management:Neurontin 300 mg 3 times daily, Duragesic patch 75 mcg, Advil 400 mg 3 times daily oxycodone as needed  9/5- will make ibuprofen prn  4. Mood:motivational support -antipsychotic agents: N/A 5. Neuropsych: This  patientiscapable of making decisions on herown behalf. 6. Skin/Wound Care:Routine skin checks -honeycomb dressing in place currently.  9/2- looking stable  9/4- skin looking stable- no skin breakdown from AFO either/no redness 7. Fluids/Electrolytes/Nutrition:Routine in and outs with follow-up chemistriesupon admit 8. Acute blood loss anemia. Follow-up CBC  9/4- Hb down to 10.1 from 10.8- will con't to follow 9. Leukocytosis secondary to steroids. Continue to monitor  9/2- WBC up to 30.8, from 28.3k- however tapering Decadron as of today- will con't to monitor- recheck Friday. NO signs of infection.  9/4- WBC down to 26.9k- afebrile 10. Urinary retention. Check PVR  9/2- pt had low PVRs- sounds like can void and empty.  9/3- per nursing, will con't to check, since have more precise/new bladder scanners. 11. Constipation in setting of Neurogenic bowel. Linzess daily. Adjust bowel program as needed  9/2- will con't Linzess since such a low dose and will push stool through colon, but reduce Miralax to daily and start bowel program with dig stim at night/evening.   9/3- encouraged pt to take  Linzess; can hold miralax if she wants.  9/4- no bowel incontinence overnight.  9/5- will make miralax prn  12/ Dispo- 9/2- team conference today- we are thinking 2 weeks, but don't have exact date as of yet since all evaluations not done yet.  LOS: 4 days A FACE TO FACE EVALUATION WAS PERFORMED  Caliana Spires 05/18/2019, 10:59 AM

## 2019-05-18 NOTE — Care Management (Signed)
[facility] Individual Statement of Services  Patient Name:  Rebecca Lucas  Date:  05/18/2019  Welcome to the [facility].  Our goal is to provide you with an individualized program based on your diagnosis and situation, designed to meet your specific needs.  With this comprehensive rehabilitation program, you will be expected to participate in at least 3 hours of rehabilitation therapies Monday-Friday, with modified therapy programming on the weekends.  Your rehabilitation program will include the following services:  Physical Therapy (PT), Occupational Therapy (OT), 24 hour per day rehabilitation nursing, Therapeutic Recreaction (TR), Neuropsychology, Case Management (Social Worker), Rehabilitation Medicine, Nutrition Services and Pharmacy Services  Weekly team conferences will be held on Wednesdays to discuss your progress.  Your Social Worker will talk with you frequently to get your input and to update you on team discussions.  Team conferences with you and your family in attendance may also be held.  Expected length of stay: 14-16 days   Overall anticipated outcome: supervision/ minimal assistance  Depending on your progress and recovery, your program may change. Your Social Worker will coordinate services and will keep you informed of any changes. Your Social Worker's name and contact numbers are listed  below.  The following services may also be recommended but are not provided by the [facility] will be made to provide these services after discharge if needed.  Arrangements include referral to agencies that provide these services.  Your insurance has been verified to be:  [employer] Your primary doctor is:  Caren Macadam  Pertinent information will be shared with your doctor and your insurance company.  Social  Worker:  [place], [place] or ([phone]   Information discussed with and copy given to patient by: Lennart Pall, 05/18/2019, 3:27 PM

## 2019-05-19 ENCOUNTER — Inpatient Hospital Stay (HOSPITAL_COMMUNITY): Payer: 59 | Admitting: Physical Therapy

## 2019-05-19 ENCOUNTER — Inpatient Hospital Stay (HOSPITAL_COMMUNITY): Payer: 59 | Admitting: Occupational Therapy

## 2019-05-19 LAB — COMPREHENSIVE METABOLIC PANEL
ALT: 64 U/L — ABNORMAL HIGH (ref 0–44)
AST: 41 U/L (ref 15–41)
Albumin: 2.1 g/dL — ABNORMAL LOW (ref 3.5–5.0)
Alkaline Phosphatase: 99 U/L (ref 38–126)
Anion gap: 8 (ref 5–15)
BUN: 24 mg/dL — ABNORMAL HIGH (ref 8–23)
CO2: 25 mmol/L (ref 22–32)
Calcium: 8.5 mg/dL — ABNORMAL LOW (ref 8.9–10.3)
Chloride: 104 mmol/L (ref 98–111)
Creatinine, Ser: 0.62 mg/dL (ref 0.44–1.00)
GFR calc Af Amer: >60 mL/min (ref >60)
GFR calc non Af Amer: >60 mL/min (ref >60)
Glucose, Bld: 129 mg/dL — ABNORMAL HIGH (ref 70–99)
Potassium: 4.7 mmol/L (ref 3.5–5.1)
Sodium: 137 mmol/L (ref 135–145)
Total Bilirubin: 0.5 mg/dL (ref 0.3–1.2)
Total Protein: 4.7 g/dL — ABNORMAL LOW (ref 6.5–8.1)

## 2019-05-19 LAB — CBC WITH DIFFERENTIAL/PLATELET
Abs Immature Granulocytes: 1 10*3/uL — ABNORMAL HIGH (ref 0.00–0.07)
Basophils Absolute: 0 10*3/uL (ref 0.0–0.1)
Basophils Relative: 0 %
Eosinophils Absolute: 0 10*3/uL (ref 0.0–0.5)
Eosinophils Relative: 0 %
HCT: 30.2 % — ABNORMAL LOW (ref 36.0–46.0)
Hemoglobin: 9.6 g/dL — ABNORMAL LOW (ref 12.0–15.0)
Lymphocytes Relative: 0 %
Lymphs Abs: 0 10*3/uL — ABNORMAL LOW (ref 0.7–4.0)
MCH: 31.9 pg (ref 26.0–34.0)
MCHC: 31.8 g/dL (ref 30.0–36.0)
MCV: 100.3 fL — ABNORMAL HIGH (ref 80.0–100.0)
Metamyelocytes Relative: 1 %
Monocytes Absolute: 0.7 10*3/uL (ref 0.1–1.0)
Monocytes Relative: 3 %
Myelocytes: 3 %
Neutro Abs: 23 10*3/uL — ABNORMAL HIGH (ref 1.7–7.7)
Neutrophils Relative %: 93 %
Platelets: 177 10*3/uL (ref 150–400)
RBC: 3.01 MIL/uL — ABNORMAL LOW (ref 3.87–5.11)
RDW: 17 % — ABNORMAL HIGH (ref 11.5–15.5)
WBC: 24.7 10*3/uL — ABNORMAL HIGH (ref 4.0–10.5)
nRBC: 0 /100 WBC
nRBC: 0.1 % (ref 0.0–0.2)

## 2019-05-19 NOTE — Progress Notes (Signed)
oPhysical Therapy Session Note  Patient Details  Name: Rebecca Lucas MRN: [id number] Date of Birth: Mar 02, 1956  Today's Date: 05/19/2019 PT Individual Time: 1102-1200 and 1440-1540 PT Individual Time Calculation (min): 58 min and 60 min  Short Term Goals: Week 1:  PT Short Term Goal 1 (Week 1): Pt will be able to complete transfers with min A consistently PT Short Term Goal 2 (Week 1): Pt will ambulate x 50 ft with LRAD and mod A PT Short Term Goal 3 (Week 1): Pt will initiate w/c mobility  Skilled Therapeutic Interventions/Progress Updates: Tx1: Pt presented in recliner agreeable to therapy. Pt stating some mild pain in low back 4/10 but has been premedicated. Pt performed squat pivot transfer to L with min/modA and transported to rehab gym for energy conservation. Performed squat pivot to R minA. Pt set up in [facility] sling and remaining session focused on standing balance with sling on to encourage increased L wt bearing through LLE. Pt performed toe taps with RLE with PTA providing manual facilitation at hips to improve weight shift to L and blocking L knee. Pt also performed forward/backwards step with RLE with mirror feedback for forced use of LLE. Pt ambulated in Maxi Sky x 59f with RW and mirror feedback with PTA advancing LLE for accurate placement and blocking L knee. Pt initially required manual facilitation and verbal cues for improved wt shift to L and increased recruitment of L quad/hamstring but pt able to improve demonstrate carryover after several steps and improve wt bearing on LLE. Participated in STS with 2in step under RLE for forced recruitment of LLE with pt requiring modA to complete transfer. Pt transferred back to w/c in same manner as prior and transported back to room. Performed squat pivot to recliner to R minA and removed shoes/AFO with noted swelling on L foot. PTA placed PRAFO on K foot per pt request and advised best to elevate after lunch. Pt left in recliner with  call bell within reach and current needs met.   Tx2: Pt presented in ed agreeable to thearpy. Performed bed mobility supine to sit with bed flat and CGA and PTA donned shoes/AFO total A for time management. Performed squat pivot to w/c CGA and pt propelled to rehab gym supervision level using BUE. Pt transferred to mat via squat pivot and transferred to sidelying with minA for LLE. Pt participated in sidelying therex performing AROM/AAROM in gravity eliminated environment. Pt performed knee flexion/extension with hip neutral, hip flexion then pushing into extension (straghtening leg), and hip flexion/extension with knee extended. All performed 15-20 reps/to fatigue. PTA noted trace activation of quad with repetitions. Pt returned to sitting with CGA and transferred to w/c CGA via squat pivot. Pt transported partially and propelled remaining distance to day room where pt participated in Cybex Kinetron 80cm/sec 3 x 10with pt able to push LLE to equal level of RLE. Pt also performed with foot pad placed with increased DF to increase hamstring recruitment with pt able to push past RLE x 10. Pt transported back to room and pt request to return to bed. Performed squat pivot to bed CGA and required minA for sit to supine transfer as required assist for LLE management. Pt positioned to comfort in sidelying and left with bed alarm on, call bell within reach and current needs met.      Therapy Documentation Precautions:  Precautions Precautions: Fall, Back Precaution Comments: reviewed spinal precautions Required Braces or Orthoses: Spinal Brace Spinal Brace: Lumbar corset, Applied  in sitting position Restrictions Weight Bearing Restrictions: No General:   Vital Signs:   Pain: Pain Assessment Pain Scale: 0-10 Pain Score: 0-No pain Pain Location: Back Pain Orientation: Lower Pain Descriptors / Indicators: Aching Pain Frequency: Intermittent Pain Onset: On-going Pain Intervention(s): Medication (See  eMAR)    Therapy/Group: Individual Therapy  Menachem Urbanek  Kieanna Rollo, PTA  05/19/2019, 1:13 PM

## 2019-05-19 NOTE — Progress Notes (Signed)
[facility] PHYSICAL MEDICINE & REHABILITATION PROGRESS NOTE   Subjective/Complaints:  Pt reports No BM as of yet today- LBM yesterday- TO start Chemo 9/17 per pt- spoke with Dr Earlie Server.    Objective:   No results found. Recent Labs    05/17/19 0411 05/19/19 0404  WBC 26.9* 24.7*  HGB 10.1* 9.6*  HCT 31.7* 30.2*  PLT 159 177   Recent Labs    05/17/19 0411 05/19/19 0404  NA 136 137  K 4.6 4.7  CL 104 104  CO2 25 25  GLUCOSE 119* 129*  BUN 19 24*  CREATININE 0.66 0.62  CALCIUM 8.1* 8.5*    Intake/Output Summary (Last 24 hours) at 05/19/2019 1133 Last data filed at 05/19/2019 0716 Gross per 24 hour  Intake 716 ml  Output -  Net 716 ml     Physical Exam: Vital Signs Blood pressure 121/63, pulse 75, temperature 98 F (36.7 C), resp. rate 18, height 5\' 7"  (1.702 m), weight 61.3 kg, SpO2 98 %.  Constitutional:  Vitals, labs and imaging reviewed Awake alert, appropriate, seen first in room, sitting up; then in gym; in lite gait harness, standing, NAD HENT: Round facies- thinner as steroids taper Eyes:EOMs intact Neck:Normal range of motion.No tracheal deviationpresent. Cardiovascular:Normal rateand regular rhythm.  Respiratory:CTA B/L HA:LPFX. NT, ND, (+)BS Musculoskeletal:Normal range of motion.  Losing a little bit of ankle DF on LLE- can get to 90 degrees, but couldn't get >90 degrees with passive ROM UE 5/5. RLE: 4-/5 to 4/5 prox to distal. LLE: HF,KE trace to 0/5, ADF tr/5, APF 1+/5. Sensation decreased to LT, pain and proprioception from left inguinal area to toes. DTR's absent. Skin:No rashnoted. No erythema.  Incision covered with honeycomb dressing (said it was replaced yesterday)- it has a little/minimal draining near top and near bottom- stable based on picture from yesterday.   Assessment/Plan: 1. 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  Physiatrist is providing close team supervision and 24 hour management of active medical problems listed below.  Physiatrist and rehab team continue to assess barriers to discharge/monitor patient progress toward functional and medical goals  Care Tool:  Bathing  Bathing activity did not occur: Refused Body parts bathed by patient: Right arm, Left arm, Chest, Abdomen, Front perineal area, Right upper leg, Left upper leg, Face, Buttocks, Right lower leg, Left lower leg   Body parts bathed by helper: Buttocks, Right lower leg, Left lower leg     Bathing assist Assist Level: Supervision/Verbal cueing     Upper Body Dressing/Undressing Upper body dressing   What is the patient wearing?: Pull over shirt, Orthosis    Upper body assist Assist Level: Set up assist    Lower Body Dressing/Undressing Lower body dressing      What is the patient wearing?: Underwear/pull up, Pants     Lower body assist Assist for lower body dressing: Minimal Assistance - Patient > 75%     Toileting Toileting Toileting Activity did not occur Landscape architect and hygiene only): Refused  Toileting assist Assist for toileting: Minimal Assistance - Patient > 75%     Transfers Chair/bed transfer  Transfers assist     Chair/bed transfer assist level: Moderate Assistance - Patient 50 - 74%     Locomotion Ambulation   Ambulation assist      Assist level:  Moderate Assistance - Patient 50 - 74% Assistive device: Walker-rolling(AFO) Max distance: 5   Walk 10 feet activity   Assist     Assist level: Minimal Assistance - Patient > 75% Assistive device: Walker-rolling(L AFO, shoe cover)   Walk 50 feet activity   Assist Walk 50 feet with 2 turns activity did not occur: Safety/medical concerns         Walk 150 feet activity   Assist Walk 150 feet activity did not  occur: Safety/medical concerns         Walk 10 feet on uneven surface  activity   Assist Walk 10 feet on uneven surfaces activity did not occur: Safety/medical concerns         Wheelchair     Assist Will patient use wheelchair at discharge?: Yes Type of Wheelchair: Manual Wheelchair activity did not occur: Safety/medical concerns  Wheelchair assist level: Independent Max wheelchair distance: 150    Wheelchair 50 feet with 2 turns activity    Assist    Wheelchair 50 feet with 2 turns activity did not occur: Safety/medical concerns   Assist Level: Independent   Wheelchair 150 feet activity     Assist  Wheelchair 150 feet activity did not occur: Safety/medical concerns   Assist Level: Independent   Blood pressure 121/63, pulse 75, temperature 98 F (36.7 C), resp. rate 18, height 5\' 7"  (1.702 m), weight 61.3 kg, SpO2 98 %.  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  -admit to inpatient rehab.  -Back brace when out of bed. -Plan is to follow-up outpatient Dr. Earlie Server in regards to chemotherapy  9/6- Chemo to start 05/30/2019, not 9/24, like originally planned 2. Antithrombotics: -DVT/anticoagulation:SCDs.LE venous dopplers performed today and are negative -will not begin lovenox as platelets have been generally trending down (129k 8/30) -antiplatelet therapy: N/A 3. Pain Management:Neurontin 300 mg 3 times daily, Duragesic patch 75 mcg, Advil 400 mg 3 times daily oxycodone as needed  9/5- will make ibuprofen prn  4.  Mood:motivational support -antipsychotic agents: N/A 5. Neuropsych: This patientiscapable of making decisions on herown behalf. 6. Skin/Wound Care:Routine skin checks -honeycomb dressing in place currently.  9/2- looking stable  9/4- skin looking stable- no skin breakdown from AFO either/no redness 7. Fluids/Electrolytes/Nutrition:Routine in and outs with follow-up chemistriesupon admit 8. Acute blood loss anemia. Follow-up CBC  9/4- Hb down to 10.1 from 10.8- will con't to follow  9/6- Hb down to 9.6- is trending down, but will follow closely 9. Leukocytosis secondary to steroids. Continue to monitor  9/2- WBC up to 30.8, from 28.3k- however tapering Decadron as of today- will con't to monitor- recheck Friday. NO signs of infection.  9/4- WBC down to 26.9k- afebrile  9/6- WBC down to 24.7k 10. Urinary retention. Check PVR  9/2- pt had low PVRs- sounds like can void and empty.  9/3- per nursing, will con't to check, since have more precise/new bladder scanners. 11. Constipation in setting of Neurogenic bowel. Linzess daily. Adjust bowel program as needed  9/2- will con't Linzess since such a low dose and will push stool through colon, but reduce Miralax to daily and start bowel program with dig stim at night/evening.   9/3- encouraged pt to take  Linzess; can hold miralax if she  wants.  9/4- no bowel incontinence overnight.  9/5- will make miralax prn  12/ Dispo- 9/2- team conference today- we are thinking 2 weeks, but don't have exact date as of yet since all evaluations not done yet.  9/6- needs to be home by 9/16 at latest so can start Chemo on 9/17    LOS: 5 days A FACE TO FACE EVALUATION WAS PERFORMED  Rebecca Lucas 05/19/2019, 11:33 AM

## 2019-05-19 NOTE — Progress Notes (Signed)
Occupational Therapy Session Note  Patient Details  Name: Rebecca Lucas MRN: [id number] Date of Birth: [birth year]  Today's Date: 05/19/2019 OT Individual Time: 4098-1191 OT Individual Time Calculation (min): 72 min   Short Term Goals: Week 1:  OT Short Term Goal 1 (Week 1): Pt will consistently complete functional transfers with min A using LRAD OT Short Term Goal 2 (Week 1): Pt will thread pants with supervision using AE PRN OT Short Term Goal 3 (Week 1): Pt will stand to complete 1 grooming task with CGA  in order to increase functional activity tolerance OT Short Term Goal 4 (Week 1): Pt will bathe with min A using AE PRN     Skilled Therapeutic Interventions/Progress Updates:    Pt greeted in bed and premedicated for pain. Started off with problem solving bed mobility for home. Bed was raised to height at home with bedrails removed. Had her practice logrolling Rt>Lt and completing supine<sits from each side of the bed. She was able to hook her L LE with the Rt to guide affected limb when rolling and when moving feet off of bed. Increased difficultly with pushing up through Rt vs Lt shoulder, though she was able to do so with supervision assist. Pt reports she plans to switch sleeping spots with spouse so she can push up from Lt elbow. After donning LSO with setup, squat pivot<w/c completed with Mod A. She then proceeded with bathing/dressing tasks sit<stand at the sink. Able to use the reacher to remove her gripper socks and utilize lateral leans for removing underwear. Lateral leans for perihygiene with instruction and LH sponge used for washing feet. Vcs for reacher use when threading LEs into underwear and pants. Sit<stand with Min A. Mod A for dynamic standing balance while she elevated LB garments over hips. Noted inversion of Lt foot in standing. Discussed her B/D routine at home. Pt has a level entry walk in shower, plans to place her 3:1 inside of it. She has grab bars properly placed so  she can easily pivot into shower from the w/c. She is aware that she has to wait for MD clearance before showering at home due to accessed port. Max A for footwear, including Teds and Lt AFO. She was able to use the shoe horn to don Rt shoe. Pt then completed squat pivot<recliner with Mod A. Had pt set up w/c in prep for transfer. She was repositioned for comfort with LEs elevated. Before finishing session, she completed hamstring stretches using leg lifter with vcs, holding stretch for 1 minute intervals, 3 sets. Strongly encouraged her to perform these stretches, and also gentle L LE PROM outside of therapies. She verbalized understanding. Pt remained in recliner with all needs within reach at end of session.   Therapy Documentation Precautions:  Precautions Precautions: Fall, Back Precaution Comments: reviewed spinal precautions Required Braces or Orthoses: Spinal Brace Spinal Brace: Lumbar corset, Applied in sitting position Restrictions Weight Bearing Restrictions: No Pain: Pain Assessment Pain Scale: 0-10 Pain Score: 5  Pain Location: Back Pain Orientation: Lower Pain Descriptors / Indicators: Aching Pain Frequency: Intermittent Pain Onset: On-going Pain Intervention(s): Medication (See eMAR) ADL:      Therapy/Group: Individual Therapy  Ramil Edgington A Clarrisa Kaylor 05/19/2019, 12:22 PM

## 2019-05-20 ENCOUNTER — Inpatient Hospital Stay (HOSPITAL_COMMUNITY): Payer: 59 | Admitting: Occupational Therapy

## 2019-05-20 ENCOUNTER — Inpatient Hospital Stay (HOSPITAL_COMMUNITY): Payer: 59 | Admitting: Physical Therapy

## 2019-05-20 ENCOUNTER — Encounter (HOSPITAL_COMMUNITY): Payer: Self-pay | Admitting: Physical Medicine & Rehabilitation

## 2019-05-20 DIAGNOSIS — G8918 Other acute postprocedural pain: Secondary | ICD-10-CM | POA: Diagnosis present

## 2019-05-20 DIAGNOSIS — R739 Hyperglycemia, unspecified: Secondary | ICD-10-CM | POA: Diagnosis present

## 2019-05-20 DIAGNOSIS — D62 Acute posthemorrhagic anemia: Secondary | ICD-10-CM | POA: Diagnosis present

## 2019-05-20 DIAGNOSIS — T380X5A Adverse effect of glucocorticoids and synthetic analogues, initial encounter: Secondary | ICD-10-CM

## 2019-05-20 DIAGNOSIS — D72828 Other elevated white blood cell count: Secondary | ICD-10-CM

## 2019-05-20 DIAGNOSIS — D72829 Elevated white blood cell count, unspecified: Secondary | ICD-10-CM | POA: Diagnosis present

## 2019-05-20 LAB — CBC WITH DIFFERENTIAL/PLATELET
Abs Immature Granulocytes: 2.34 10*3/uL — ABNORMAL HIGH (ref 0.00–0.07)
Basophils Absolute: 0.2 10*3/uL — ABNORMAL HIGH (ref 0.0–0.1)
Basophils Relative: 1 %
Eosinophils Absolute: 0 10*3/uL (ref 0.0–0.5)
Eosinophils Relative: 0 %
HCT: 31.4 % — ABNORMAL LOW (ref 36.0–46.0)
Hemoglobin: 10 g/dL — ABNORMAL LOW (ref 12.0–15.0)
Immature Granulocytes: 10 %
Lymphocytes Relative: 1 %
Lymphs Abs: 0.2 10*3/uL — ABNORMAL LOW (ref 0.7–4.0)
MCH: 31.7 pg (ref 26.0–34.0)
MCHC: 31.8 g/dL (ref 30.0–36.0)
MCV: 99.7 fL (ref 80.0–100.0)
Monocytes Absolute: 1.1 10*3/uL — ABNORMAL HIGH (ref 0.1–1.0)
Monocytes Relative: 4 %
Neutro Abs: 20.9 10*3/uL — ABNORMAL HIGH (ref 1.7–7.7)
Neutrophils Relative %: 84 %
Platelets: 207 10*3/uL (ref 150–400)
RBC: 3.15 MIL/uL — ABNORMAL LOW (ref 3.87–5.11)
RDW: 17.1 % — ABNORMAL HIGH (ref 11.5–15.5)
WBC: 24.6 10*3/uL — ABNORMAL HIGH (ref 4.0–10.5)
nRBC: 0.1 % (ref 0.0–0.2)

## 2019-05-20 LAB — COMPREHENSIVE METABOLIC PANEL
ALT: 68 U/L — ABNORMAL HIGH (ref 0–44)
AST: 46 U/L — ABNORMAL HIGH (ref 15–41)
Albumin: 2.2 g/dL — ABNORMAL LOW (ref 3.5–5.0)
Alkaline Phosphatase: 103 U/L (ref 38–126)
Anion gap: 9 (ref 5–15)
BUN: 23 mg/dL (ref 8–23)
CO2: 26 mmol/L (ref 22–32)
Calcium: 8.5 mg/dL — ABNORMAL LOW (ref 8.9–10.3)
Chloride: 102 mmol/L (ref 98–111)
Creatinine, Ser: 0.91 mg/dL (ref 0.44–1.00)
GFR calc Af Amer: >60 mL/min (ref >60)
GFR calc non Af Amer: >60 mL/min (ref >60)
Glucose, Bld: 132 mg/dL — ABNORMAL HIGH (ref 70–99)
Potassium: 4.7 mmol/L (ref 3.5–5.1)
Sodium: 137 mmol/L (ref 135–145)
Total Bilirubin: 0.4 mg/dL (ref 0.3–1.2)
Total Protein: 5 g/dL — ABNORMAL LOW (ref 6.5–8.1)

## 2019-05-20 NOTE — Progress Notes (Signed)
Occupational Therapy Session Note  Patient Details  Name: Rebecca Lucas MRN: [id number] Date of Birth: [birth year]  Today's Date: 05/20/2019 OT Individual Time: 1100-1158 and 1400-1500 OT Individual Time Calculation (min): 58 min and 60 min   Short Term Goals: Week 1:  OT Short Term Goal 1 (Week 1): Pt will consistently complete functional transfers with min A using LRAD OT Short Term Goal 2 (Week 1): Pt will thread pants with supervision using AE PRN OT Short Term Goal 3 (Week 1): Pt will stand to complete 1 grooming task with CGA  in order to increase functional activity tolerance OT Short Term Goal 4 (Week 1): Pt will bathe with min A using AE PRN  Skilled Therapeutic Interventions/Progress Updates:    Session One: Pt seen for OT session focusing on neuro re-ed with L LE in prep for functional standing tasks. Pt sitting up in recliner upon arrival, ready to begin tx session. No complaints of pain at start of session, however, pt alerted RN for need of pain medication at end of session following activity, RN administered as therapist exited room. Pt completed stand pivot transfers throughout session with overall min A. Pt noted to have limited WBing through L LE, essentially hoping on R LE to complete transfer.  She self propelled w/c throughout unit with supervision for UE strengthening/endurance.  Completed static standing task from EOM with mirror placed in front for visual feedback of LE placement/control. Had pt work on stepping on target with L LE addressing controlled movements. Ankle weights placed for increased proprioceptive control, most success when using #3 weight. Pt required to come up onto R toes in order to clear L LE through during standing/step. Completed x4 sets of 5 with seated rest breaks btwn trials. Completed stand pivot transfer EOM<> w/c with #3 weight placed and pt demonstrated increased control and WBing through L LE with weight placed.   Pt returned to supine on  mat. Completed supine hip strengthening exercises with pt requiring min A for control of L LE focusing on pt obtaining and maintaining unsupported position of L LE with foot placed on mat. Pt returned to room at end of session, transitioned back to sitting in recliner and left with all needs in reach and RN present.   Session Two: Pt seen for OT session focusing on functional transfers and LE strengthening. Pt sitting up in recliner upon arrival, agreeable to tx session. Pt with no formal complaints of pain and denied need for scheduled pain med which was due.  She completed stand pivot transfers throughout session with min A overall with increased time and noted improved L LE WBing throughout transfers. She self propelled w/c throughout unit mod I. In ADL apartment, completed w/c<> low soft surface couch transfer. Completed x2 trials with first trial utilizing RW and 2nd trial completed via squat pivot. She required max A to stand to RW from couch and assist for controlled descent back onto low couch. CGA-min A for squat pivot transfers with VCs for technique. Education provided regarding situations at home for use of stand pivot vs squat pivot. Completed x2 trials of w/c<> standard bed, stand pivot completed in same manner as described above, however, CGA-supervision for squat pivots. Discussed night time toileting routine with recommendation for use of drop arm BSC at bed side to reduce fall risk and reduce caregiver burden.  Pt completed x10 minutes on NuStep, no UE use with mod-max A to initiate movement of L LE to begin to push  into extension and assist to maintain neutral position of knee/hip. She returned to room at end of session, returned to bed and positioned in side-lying for comfort, all needs in reach and bed alarm on.   Therapy Documentation Precautions:  Precautions Precautions: Fall, Back Precaution Comments: reviewed spinal precautions Required Braces or Orthoses: Spinal  Brace Spinal Brace: Lumbar corset, Applied in sitting position Restrictions Weight Bearing Restrictions: No   Therapy/Group: Individual Therapy  Javonn Gauger L 05/20/2019, 7:12 AM

## 2019-05-20 NOTE — Plan of Care (Signed)
  Problem: Consults Goal: RH SPINAL CORD INJURY PATIENT EDUCATION Description:  See Patient Education module for education specifics.  Outcome: Progressing Goal: Skin Care Protocol Initiated - if Braden Score 18 or less Description: If consults are not indicated, leave blank or document N/A Outcome: Progressing   Problem: SCI BOWEL ELIMINATION Goal: RH STG MANAGE BOWEL WITH ASSISTANCE Description: STG Manage Bowel with mod I Assistance. Outcome: Progressing Goal: RH STG SCI MANAGE BOWEL WITH MEDICATION WITH ASSISTANCE Description: STG SCI Manage bowel with medication with mod I assistance. Outcome: Progressing Goal: RH STG SCI MANAGE BOWEL PROGRAM W/ASSIST OR AS APPROPRIATE Description: STG SCI Manage bowel program mod I assist or as appropriate. Outcome: Progressing   Problem: RH SKIN INTEGRITY Goal: RH STG MAINTAIN SKIN INTEGRITY WITH ASSISTANCE Description: STG Maintain Skin Integrity With mod I Assistance. Outcome: Progressing   Problem: RH PAIN MANAGEMENT Goal: RH STG PAIN MANAGED AT OR BELOW PT'S PAIN GOAL Description: < 4 Outcome: Progressing

## 2019-05-20 NOTE — Progress Notes (Signed)
[facility] PHYSICAL MEDICINE & REHABILITATION PROGRESS NOTE   Subjective/Complaints: Patient seen laying in bed this morning.  She states she slept fairly overnight due to being in the hospital.  She has questions regarding dressing changes.  She states she had a good weekend.  ROS: Denies CP, shortness of breath, nausea, vomiting, diarrhea.  Objective:   No results found. Recent Labs    05/19/19 0404 05/20/19 0328  WBC 24.7* 24.6*  HGB 9.6* 10.0*  HCT 30.2* 31.4*  PLT 177 207   Recent Labs    05/19/19 0404 05/20/19 0328  NA 137 137  K 4.7 4.7  CL 104 102  CO2 25 26  GLUCOSE 129* 132*  BUN 24* 23  CREATININE 0.62 0.91  CALCIUM 8.5* 8.5*    Intake/Output Summary (Last 24 hours) at 05/20/2019 1041 Last data filed at 05/20/2019 0900 Gross per 24 hour  Intake 712 ml  Output -  Net 712 ml     Physical Exam: Vital Signs Blood pressure 140/81, pulse (!) 104, temperature 97.9 F (36.6 C), temperature source Oral, resp. rate 18, height 5\' 7"  (1.702 m), weight 59 kg, SpO2 100 %. Constitutional: No distress . Vital signs reviewed. HENT: Normocephalic.  Atraumatic. Eyes: EOMI. No discharge. Cardiovascular: No JVD. Respiratory: Normal effort.  No stridor. GI: Non-distended. Skin: Warm and dry.  Intact. Psych: Normal mood.  Normal behavior. Musc: No edema or tenderness in extremities. Neuro: Alert Motor: Bilateral upper extremities: 5/5 proximal distal Right lower extremity: 4+/5 proximal distal Left lower extremity: Ankle plantarflexion 3+/5, otherwise 0/5 Sensation diminished to light touch left lower extremity  Skin:Left distal tibia with lesion healing  Assessment/Plan: 1. 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  Physiatrist is providing close team  supervision and 24 hour management of active medical problems listed below.  Physiatrist and rehab team continue to assess barriers to discharge/monitor patient progress toward functional and medical goals  Care Tool:  Bathing  Bathing activity did not occur: Refused Body parts bathed by patient: Right arm, Left arm, Chest, Abdomen, Front perineal area, Right upper leg, Left upper leg, Face, Buttocks, Right lower leg, Left lower leg   Body parts bathed by helper: Buttocks, Right lower leg, Left lower leg     Bathing assist Assist Level: Supervision/Verbal cueing     Upper Body Dressing/Undressing Upper body dressing   What is the patient wearing?: Pull over shirt, Orthosis    Upper body assist Assist Level: Set up assist    Lower Body Dressing/Undressing Lower body dressing      What is the patient wearing?: Underwear/pull up, Pants     Lower body assist Assist for lower body dressing: Minimal Assistance - Patient > 75%     Toileting Toileting Toileting Activity did not occur Landscape architect and hygiene only): Refused  Toileting assist Assist for toileting: Minimal Assistance - Patient > 75%     Transfers Chair/bed transfer  Transfers assist     Chair/bed transfer assist level: Moderate Assistance - Patient 50 - 74%     Locomotion Ambulation   Ambulation assist      Assist level: Moderate Assistance - Patient 50 - 74% Assistive device: Walker-rolling(AFO) Max distance: 5   Walk 10 feet activity   Assist     Assist level: Minimal Assistance - Patient > 75% Assistive  device: Walker-rolling(L AFO, shoe cover)   Walk 50 feet activity   Assist Walk 50 feet with 2 turns activity did not occur: Safety/medical concerns         Walk 150 feet activity   Assist Walk 150 feet activity did not occur: Safety/medical concerns         Walk 10 feet on uneven surface  activity   Assist Walk 10 feet on uneven surfaces activity did not occur:  Safety/medical concerns         Wheelchair     Assist Will patient use wheelchair at discharge?: Yes Type of Wheelchair: Manual Wheelchair activity did not occur: Safety/medical concerns  Wheelchair assist level: Independent Max wheelchair distance: 150    Wheelchair 50 feet with 2 turns activity    Assist    Wheelchair 50 feet with 2 turns activity did not occur: Safety/medical concerns   Assist Level: Independent   Wheelchair 150 feet activity     Assist  Wheelchair 150 feet activity did not occur: Safety/medical concerns   Assist Level: Independent   Blood pressure 140/81, pulse (!) 104, temperature 97.9 F (36.6 C), temperature source Oral, resp. rate 18, height 5\' 7"  (1.702 m), weight 59 kg, SpO2 100 %.  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  Continue CIR -Back brace when out of bed. -Plan is to follow-up outpatient Dr. Earlie Server in regards to chemotherapy  9/6- Chemo to start 05/30/2019, not 9/24, like originally planned 2. Antithrombotics: -DVT/anticoagulation:SCDs.LE venous dopplers negative for DVT Will consider starting Lovenox -antiplatelet therapy: N/A 3. Pain Management:Neurontin 300 mg 3 times daily, Duragesic patch 75 mcg, Advil 400 mg 3 times daily oxycodone as needed  Ibuprofen changed to PRN on 9/5  Relatively controlled with medication on 9/7  4. Mood:motivational support -antipsychotic agents: N/A 5. Neuropsych: This patientiscapable of making decisions on herown behalf. 6.  Skin/Wound Care:Routine skin checks -honeycomb dressing in place currently, plan to DC sutures soon  Continue foam dressing to left lower extremity 7. Fluids/Electrolytes/Nutrition:Routine in and outs  BMP within acceptable range on 9/7 8. Acute blood loss anemia.   Hemoglobin 10.0 on 9/7   Continue to monitor 9. Leukocytosis secondary to steroids. Continue to monitor  WBCs 24.6 on 9/7  Afebrile  Continue to monitor 10. Urinary retention:  Appears to be improving 11. Constipation in setting of Neurogenic bowel. Linzess daily. Adjust bowel program as needed  Bowel program with dig stim at night/evening.   Encouraged pt to take  Linzess; can hold miralax if she wants.  Miralax prn  Appears to be improving 12.  Steroid-induced hyperglycemia  Continue to monitor  LOS: 6 days A FACE TO FACE EVALUATION WAS PERFORMED  Ankit Lorie Phenix 05/20/2019, 10:41 AM

## 2019-05-20 NOTE — Progress Notes (Signed)
Physical Therapy Session Note  Patient Details  Name: Rebecca Lucas MRN: [id number] Date of Birth: June 09, 1956  Today's Date: 05/20/2019 PT Individual Time: 2778-2423 PT Individual Time Calculation (min): 75 min   Short Term Goals: Week 1:  PT Short Term Goal 1 (Week 1): Pt will be able to complete transfers with min A consistently PT Short Term Goal 2 (Week 1): Pt will ambulate x 50 ft with LRAD and mod A PT Short Term Goal 3 (Week 1): Pt will initiate w/c mobility  Skilled Therapeutic Interventions/Progress Updates:    Pt received supine in bed, agreeable to PT session. Pt reports 4/10 pain in back, premedicated prior to start of therapy session. Supine to sit with min A with use of bedrail from flat bed. Pt is setup A to don LSO while seated EOB. Pt is dependent to don TED hose, shoes, and L GRAFO. Squat pivot transfer bed to w/c with mod A. Setup A to wash face and head at sink. Manual w/c propulsion x 150 ft mod I. Reviewed management of w/c parts and safe setup of w/c for squat pivot transfer. Squat pivot transfer w/c to/from mat table with min A. Sit to stand with min to mod A to RW throughout out session. Session focus on LLE NMR in standing. Standing RLE 4" step tap with manual assist for L knee control in stance for full extension x 10 reps. Sit to stand x 5 reps with 1" step under RLE to encourage weight shift onto LLE with transfer, pt exhibits improved ability to WB through LLE with this transfer but once standing is not WBing through LLE due to height difference. Sit to stand x 5 reps to RW on level ground with focus on staying in midline vs shifting to the R during transfer, use of mirror for visual feedback. Manual cues through L knee in stance for extension. Standing mini-squats x 10 reps with focus on equal WBing through LE as well as L knee control. Forward step with RLE and RW with manual cues for L knee control in stance. Forward step with LLE and RW, pt tends to PF RLE to elevate  in order to swing through LLE via circumduction due to significant weakness in LLE. Nustep level 3 x 5 min with use of BLE>UE with manual assist to initiate LLE hip extension. Pt requests to use the bathroom at end of session. Stand pivot transfer w/c to [facility] with RW and min A. Pt is able to manage her clothing with min A. Pt left seated on BSC with call button in reach at end of session.  Therapy Documentation Precautions:  Precautions Precautions: Fall, Back Precaution Comments: reviewed spinal precautions Required Braces or Orthoses: Spinal Brace Spinal Brace: Lumbar corset, Applied in sitting position Restrictions Weight Bearing Restrictions: No    Therapy/Group: Individual Therapy   Excell Seltzer, PT, DPT  05/20/2019, 12:35 PM

## 2019-05-21 ENCOUNTER — Inpatient Hospital Stay (HOSPITAL_COMMUNITY): Payer: 59 | Admitting: Occupational Therapy

## 2019-05-21 ENCOUNTER — Inpatient Hospital Stay (HOSPITAL_COMMUNITY): Payer: 59 | Admitting: Physical Therapy

## 2019-05-21 ENCOUNTER — Inpatient Hospital Stay (HOSPITAL_COMMUNITY): Payer: 59

## 2019-05-21 MED ORDER — HEPARIN SOD (PORK) LOCK FLUSH 100 UNIT/ML IV SOLN
500.0000 [IU] | INTRAVENOUS | Status: AC | PRN
  Administered 2019-05-21: 500 [IU]

## 2019-05-21 NOTE — Plan of Care (Signed)
  Problem: Consults Goal: RH SPINAL CORD INJURY PATIENT EDUCATION Description:  See Patient Education module for education specifics.  Outcome: Progressing Goal: Skin Care Protocol Initiated - if Braden Score 18 or less Description: If consults are not indicated, leave blank or document N/A Outcome: Progressing   Problem: SCI BOWEL ELIMINATION Goal: RH STG MANAGE BOWEL WITH ASSISTANCE Description: STG Manage Bowel with mod I Assistance. Outcome: Progressing Goal: RH STG SCI MANAGE BOWEL WITH MEDICATION WITH ASSISTANCE Description: STG SCI Manage bowel with medication with mod I assistance. Outcome: Progressing Goal: RH STG SCI MANAGE BOWEL PROGRAM W/ASSIST OR AS APPROPRIATE Description: STG SCI Manage bowel program mod I assist or as appropriate. Outcome: Progressing   Problem: RH SKIN INTEGRITY Goal: RH STG MAINTAIN SKIN INTEGRITY WITH ASSISTANCE Description: STG Maintain Skin Integrity With mod I Assistance. Outcome: Progressing   Problem: RH PAIN MANAGEMENT Goal: RH STG PAIN MANAGED AT OR BELOW PT'S PAIN GOAL Description: < 4 Outcome: Progressing

## 2019-05-21 NOTE — Progress Notes (Signed)
Physical Therapy Session Note  Patient Details  Name: Rebecca Lucas MRN: [id number] Date of Birth: [birth year]  Today's Date: 05/21/2019 PT Individual Time: 0900-1000; 1445-1539 PT Individual Time Calculation (min): 60 min and 45 min  Short Term Goals: Week 1:  PT Short Term Goal 1 (Week 1): Pt will be able to complete transfers with min A consistently PT Short Term Goal 2 (Week 1): Pt will ambulate x 50 ft with LRAD and mod A PT Short Term Goal 3 (Week 1): Pt will initiate w/c mobility  Skilled Therapeutic Interventions/Progress Updates:    Session 1: Pt received seated in bed, agreeable to PT session. Pt reports no pain this AM. Supine to sit with min A with use of bedrail. Dependent to don TED hose, shoes, and L GRAFO while seated EOB. Squat pivot transfer bed to w/c with min A. Manual w/c propulsion 2 x 150 ft with use of BUE at mod I level. Sit to stand with min A to RW throughout session. Ambulation 2 x 45', 1 x 30' with RW and min A while trialing several different AFOs. With use of LLE GRAFO pt exhibits improved foot clearance as opposed to without use of AFO, continues to exhibit knee flexion and no heel contact during stance phase on LLE. Trial gait with PLS AFO, pt exhibits decreased LLE clearance with use of PLS with occasional toe-catching. During last bout of gait with GRAFO focus on LLE heel contact during stance phase, pt exhibits improved gait pattern with this method. Standing forward/backward step with RLE and RW with focus on L knee ext and heel contact x 10 reps, min A for balance. Sit to stand x 10 reps with min A from mat table to RW, focus on maintaining midline as pt tends to lean to the R during transfer, manual cues for increased WBing through LLE. Standing mini-squats 2 x 5 reps with RW with manual cueing through LLE for knee control, use of mirror for visual feedback. Stand pivot transfer w/c to recliner with RW and min A. Pt left seated in recliner with needs in reach at  end of session.  Session 2: Pt received sidelying in bed, agreeable to PT session. No complaints of pain. Bed mobility Supervision. Squat pivot transfer bed to w/c with min A. Manual w/c propulsion 2 x 150 ft with use of BUE at mod I level. Squat pivot transfer w/c to/from therapy mat with min. Sit to supine min A for LLE management. Supine and sidelying LLE therex in gravity eliminated positions with AAROM for hip flex/ext, knee flex/ext, ankle DF/PF, hip abd, and hip add squeeze x 10 reps each. Attempt to have pt perform quad set or SAQ, unable to elicit quad contraction. Assisted pt back to bed at end of session. Pt is Supervision for sit to supine with use of leg lifter. Pt left supine in bed with needs in reach at end of session.  Therapy Documentation Precautions:  Precautions Precautions: Fall, Back Precaution Comments: reviewed spinal precautions Required Braces or Orthoses: Spinal Brace Spinal Brace: Lumbar corset, Applied in sitting position Restrictions Weight Bearing Restrictions: No    Therapy/Group: Individual Therapy   Excell Seltzer, PT, DPT  05/21/2019, 12:38 PM

## 2019-05-21 NOTE — Progress Notes (Addendum)
Orthopedic Tech Progress Note Patient Details:  Rebecca Lucas 12-02-1955 [id number] Called in order HANGER for a AFO  Patient ID: Rebecca Lucas, female   DOB: 03/30/1956, 63 y.o.   MRN: [id number]   Janit Pagan 05/21/2019, 8:09 AM

## 2019-05-21 NOTE — Progress Notes (Signed)
[facility] PHYSICAL MEDICINE & REHABILITATION PROGRESS NOTE   Subjective/Complaints: Pt reports normal BM yesterday- no incontinence- Asking if can de-access her central port so she can have a shower.   ROS: Denies CP, shortness of breath, nausea, vomiting, diarrhea.  Objective:   No results found. Recent Labs    05/19/19 0404 05/20/19 0328  WBC 24.7* 24.6*  HGB 9.6* 10.0*  HCT 30.2* 31.4*  PLT 177 207   Recent Labs    05/19/19 0404 05/20/19 0328  NA 137 137  K 4.7 4.7  CL 104 102  CO2 25 26  GLUCOSE 129* 132*  BUN 24* 23  CREATININE 0.62 0.91  CALCIUM 8.5* 8.5*    Intake/Output Summary (Last 24 hours) at 05/21/2019 1642 Last data filed at 05/21/2019 1358 Gross per 24 hour  Intake 400 ml  Output -  Net 400 ml     Physical Exam: Vital Signs Blood pressure 126/65, pulse 94, temperature 99.6 F (37.6 C), temperature source Oral, resp. rate 17, height 5\' 7"  (1.702 m), weight 60 kg, SpO2 98 %. Constitutional: No distress . Vital signs reviewed. Sitting up in bed, wearing headscarf, round facies- slightly improved, NAD HENT: Normocephalic.  Atraumatic. Eyes: EOMI. No discharge. Cardiovascular: No JVD. Respiratory: Normal effort.  No stridor. GI: Non-distended. Skin: Warm and dry.  Intact. Psych: Normal mood.  Normal behavior. Musc: No edema or tenderness in extremities. Neuro: Alert Motor: Bilateral upper extremities: 5/5 proximal distal Right lower extremity: 4+/5 proximal distal Left lower extremity: Ankle plantarflexion 3+/5, otherwise 0/5 Sensation diminished to light touch left lower extremity  Skin:Left distal tibia with lesion healing; still has honeycomb dressing in place- drainage noted on top 1/2.  Assessment/Plan: 1. 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  Physiatrist is providing close team supervision and 24 hour management of active medical problems listed below.  Physiatrist and rehab team continue to assess barriers to discharge/monitor patient progress toward functional and medical goals  Care Tool:  Bathing  Bathing activity did not occur: Refused Body parts bathed by patient: Right arm, Left arm, Chest, Abdomen, Front perineal area, Right upper leg, Left upper leg, Face, Buttocks, Right lower leg, Left lower leg   Body parts bathed by helper: Buttocks, Right lower leg, Left lower leg     Bathing assist Assist Level: Supervision/Verbal cueing     Upper Body Dressing/Undressing Upper body dressing   What is the patient wearing?: Pull over shirt, Orthosis Orthosis activity level: Performed by patient  Upper body assist Assist Level: Set up assist    Lower Body Dressing/Undressing Lower body dressing      What is the patient wearing?: Underwear/pull up, Pants     Lower body assist Assist for lower body dressing: Moderate Assistance - Patient 50 - 74%     Toileting Toileting Toileting Activity did not occur Landscape architect and hygiene only): Refused  Toileting assist Assist for toileting: Minimal Assistance - Patient > 75%     Transfers Chair/bed transfer  Transfers assist     Chair/bed transfer assist level: Minimal Assistance - Patient > 75%     Locomotion Ambulation   Ambulation assist      Assist level: Minimal Assistance - Patient > 75% Assistive device: Walker-rolling Max distance: 40'   Walk 10 feet activity   Assist  Assist level: Minimal Assistance - Patient > 75% Assistive device: Walker-rolling   Walk 50 feet activity   Assist Walk 50 feet with 2 turns activity did not occur: Safety/medical concerns         Walk 150 feet activity   Assist Walk 150 feet activity did not occur: Safety/medical concerns         Walk 10 feet on uneven  surface  activity   Assist Walk 10 feet on uneven surfaces activity did not occur: Safety/medical concerns         Wheelchair     Assist Will patient use wheelchair at discharge?: Yes Type of Wheelchair: Manual Wheelchair activity did not occur: Safety/medical concerns  Wheelchair assist level: Independent Max wheelchair distance: 150    Wheelchair 50 feet with 2 turns activity    Assist    Wheelchair 50 feet with 2 turns activity did not occur: Safety/medical concerns   Assist Level: Independent   Wheelchair 150 feet activity     Assist   CBG (last 3)  No results for input(s): GLUCAP in the last 72 hours.   Wheelchair 150 feet activity did not occur: Safety/medical concerns   Assist Level: Independent   Blood pressure 126/65, pulse 94, temperature 99.6 F (37.6 C), temperature source Oral, resp. rate 17, height 5\' 7"  (1.702 m), weight 60 kg, SpO2 98 %.  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  Continue CIR -Back brace when out of bed. -Plan is to follow-up outpatient Dr. Earlie Server in regards to chemotherapy  9/6- Chemo to start 05/30/2019, not 9/24, like originally planned 2. Antithrombotics: -DVT/anticoagulation:SCDs.LE venous dopplers negative for DVT Will consider starting Lovenox -antiplatelet therapy: N/A 3. Pain Management:Neurontin 300 mg 3 times daily, Duragesic patch 75 mcg, Advil 400 mg 3 times daily oxycodone as needed  Ibuprofen changed to PRN on 9/5  Relatively controlled with medication on 9/7  4.  Mood:motivational support -antipsychotic agents: N/A 5. Neuropsych: This patientiscapable of making decisions on herown behalf. 6. Skin/Wound Care:Routine skin checks -honeycomb dressing in place currently, plan to DC sutures soon  Continue foam dressing to left lower extremity  9/8- will d/c honeycomb dressing 7. Fluids/Electrolytes/Nutrition:Routine in and outs  BMP within acceptable range on 9/7 8. Acute blood loss anemia.   Hemoglobin 10.0 on 9/7   Continue to monitor 9. Leukocytosis secondary to steroids. Continue to monitor  WBCs 24.6 on 9/7  Afebrile  Continue to monitor 10. Urinary retention:  Appears to be improving 11. Constipation in setting of Neurogenic bowel. Linzess daily. Adjust bowel program as needed  Bowel program with dig stim at night/evening.   Encouraged pt to take  Linzess; can hold miralax if she wants.  Miralax prn  Appears to be improving 12.  Steroid-induced hyperglycemia  Continue to monitor  13. Dispo  9/8- will try to arrange to deactivate her central port x 1 day so can take a shower.  LOS: 7 days A FACE TO FACE EVALUATION WAS PERFORMED  Rebecca Lucas 05/21/2019, 4:42 PM

## 2019-05-21 NOTE — Progress Notes (Signed)
Occupational Therapy Weekly Progress Note  Patient Details  Name: Rebecca Lucas MRN: [id number] Date of Birth: July 02, 1956  Beginning of progress report period: May 15, 2019 End of progress report period: May 21, 2019  Today's Date: 05/21/2019 OT Individual Time: 1045-1200 OT Individual Time Calculation (min): 75 min    Patient has met 4 of 4 short term goals.  Pt is making steady progress towards OT goals. She is completing squat pivot transfers with close supervision-CGA and stand pivot transfers with RW and min A with increased time. She requires steadying assist for dynamic tasks during LB clothing management, etc. Pt cont to be most limited by L LE weakness with poor sensation/proprioception.  Pt remains very motivated to return to PLOF and re-gain functional independence.   Patient continues to demonstrate the following deficits: muscle weakness and muscle paralysis, decreased cardiorespiratoy endurance and decreased sitting balance, decreased standing balance, decreased postural control, decreased balance strategies and difficulty maintaining precautions and therefore will continue to benefit from skilled OT intervention to enhance overall performance with BADL and Reduce care partner burden.  Patient progressing toward long term goals..  Continue plan of care.  OT Short Term Goals Week 1:  OT Short Term Goal 1 (Week 1): Pt will consistently complete functional transfers with min A using LRAD OT Short Term Goal 1 - Progress (Week 1): Met OT Short Term Goal 2 (Week 1): Pt will thread pants with supervision using AE PRN OT Short Term Goal 2 - Progress (Week 1): Met OT Short Term Goal 3 (Week 1): Pt will stand to complete 1 grooming task with CGA  in order to increase functional activity tolerance OT Short Term Goal 3 - Progress (Week 1): Met OT Short Term Goal 4 (Week 1): Pt will bathe with min A using AE PRN OT Short Term Goal 4 - Progress (Week 1): Met Week 2:  OT Short  Term Goal 1 (Week 2): STG=LTG due to LOS  Skilled Therapeutic Interventions/Progress Updates:    Pt seen for OT ADL bathing/dressing session and for L neuro re-ed. Pt sitting up in recliner upon arrival, agreeable to tx session and no formal complaints of pain. Pt reports port was de-accessed and requesting to shower.  She completed stand pivot transfers throughout session with overall min A with increased time to power into standing, cont with limited WBing through L LE. She bathed seated on BSC in shower. Set-up in similar manner as home, however, need to cont to problem solve best/safest method for set-up/ transfer at d/c. She bathed with supervision seated on BSC using LH sponge for LEs and using features of BSC to complete pericare/buttock hygiene.  She transitioned from [facility] to w/c and dressed seated in w/c. Used reacher with min A for clothing management and VCs for technique to thread LEs into pants/underwear. Min-mod A for dynamic standing balance while pt pulled up pants. Attempted to stand at sink to wash hands, heavy reline on forearms leaning onto sink while standing with min-mod A for balance and blocking of L knee.  In therapy gym, neuro re-ed for L LE. Pt stood from EOM to RW with R foot propped on 3" step to facilitate WBing through L LE. Mod A to stand and heavy reliance eon UEs, tolerating <10 seconds at a time of L LE WBing before requiring rest break. Completed x3 trials.  Pt returned to room at end of session, transitioned to sitting in recliner and left with all needs in reach.  Therapy Documentation Precautions:  Precautions Precautions: Fall, Back Precaution Comments: reviewed spinal precautions Required Braces or Orthoses: Spinal Brace Spinal Brace: Lumbar corset, Applied in sitting position Restrictions Weight Bearing Restrictions: No   Therapy/Group: Individual Therapy  Lindsee Labarre L 05/21/2019, 7:06 AM

## 2019-05-21 NOTE — Progress Notes (Signed)
Physical Therapy Session Note  Patient Details  Name: Rebecca Lucas MRN: [id number] Date of Birth: [birth year]  Today's Date: 05/21/2019 PT Individual Time: 1115-5208 PT Individual Time Calculation (min): 32 min    Short Term Goals: Week 1:  PT Short Term Goal 1 (Week 1): Pt will be able to complete transfers with min A consistently PT Short Term Goal 2 (Week 1): Pt will ambulate x 50 ft with LRAD and mod A PT Short Term Goal 3 (Week 1): Pt will initiate w/c mobility  Skilled Therapeutic Interventions/Progress Updates:    NMR during functional transfers with RW for sit <> stands and stand step transfers with RW (L AFO) and standing activities on Kinetron including alternating stepping for reciprocal movement pattern retraining and close chain activity for activation through LLE and working on controlled stance in equal weightbearing with mirror for visual feedback (several bouts of each with seated rest breaks as pt demonstrates and reports muscle fatigue). Difficulty achieving knee extension or activation of quads in stance.   Chris from [facility] to assess for consult for AFO. Pt using GRAFO on L and he continues to recommend this. Observed her gait training with L AFO and RW with noted vaulting on R to clear and initiate swing on L, L knee flexion in stance, and decreased heel strike. He states he will attempt to do some modifications once he brings her brace to allow for more PF bias but unsure if he will be able to due to the size of the shoe box. Pt able to gait ~ 40' with overall min assist.   End of session transferred back to bed with min assist and repositioned in L sidelying for comfort and decreasing pressure on back. All needs in reach.   Therapy Documentation Precautions:  Precautions Precautions: Fall, Back Precaution Comments: reviewed spinal precautions Required Braces or Orthoses: Spinal Brace Spinal Brace: Lumbar corset, Applied in sitting position Restrictions Weight  Bearing Restrictions: No Pain: C/o back pain - did not rate but states she was just premedicated. Returned to bed in sidelying at end of session for relief.   Therapy/Group: Individual Therapy  Canary Brim Ivory Broad, PT, DPT, CBIS  05/21/2019, 2:40 PM

## 2019-05-22 ENCOUNTER — Inpatient Hospital Stay (HOSPITAL_COMMUNITY): Payer: 59 | Admitting: Physical Therapy

## 2019-05-22 ENCOUNTER — Ambulatory Visit
Admission: RE | Admit: 2019-05-22 | Discharge: 2019-05-22 | Disposition: A | Discharge status: Home / Self Care | Payer: 59 | Source: Ambulatory Visit | Attending: Urology | Admitting: Urology

## 2019-05-22 ENCOUNTER — Inpatient Hospital Stay (HOSPITAL_COMMUNITY): Payer: 59 | Admitting: Occupational Therapy

## 2019-05-22 DIAGNOSIS — C7951 Secondary malignant neoplasm of bone: Secondary | ICD-10-CM

## 2019-05-22 DIAGNOSIS — C7949 Secondary malignant neoplasm of other parts of nervous system: Secondary | ICD-10-CM

## 2019-05-22 NOTE — Progress Notes (Signed)
[facility] PHYSICAL MEDICINE & REHABILITATION PROGRESS NOTE   Subjective/Complaints: Pt reports LBM this AM- was normal- pain controlled with oxycodone- takes 3-4x/day usually with good success.  Asking when sutures will be removed- honeycomb dressing is off now.  ROS: Denies CP, shortness of breath, nausea, vomiting, diarrhea.  Objective:   No results found. Recent Labs    05/20/19 0328  WBC 24.6*  HGB 10.0*  HCT 31.4*  PLT 207   Recent Labs    05/20/19 0328  NA 137  K 4.7  CL 102  CO2 26  GLUCOSE 132*  BUN 23  CREATININE 0.91  CALCIUM 8.5*    Intake/Output Summary (Last 24 hours) at 05/22/2019 1610 Last data filed at 05/22/2019 1313 Gross per 24 hour  Intake 676 ml  Output -  Net 676 ml     Physical Exam: Vital Signs Blood pressure 137/71, pulse 88, temperature 98.8 F (37.1 C), temperature source Oral, resp. rate 16, height 5\' 7"  (1.702 m), weight 60 kg, SpO2 100 %. Constitutional: No distress . Vital signs reviewed. Standing with mod assist from PT, using RW, and L AFO, wearing headscarf, round facies from steroids, NAD HENT: Normocephalic.  Atraumatic. Eyes: EOMI. No discharge. Cardiovascular: RRR Respiratory: Normal effort.  No stridor. CTA b/l GI: Non-distended. NT, soft, (+)BS Skin: Warm and dry.  Intact. Psych: Normal mood.  Normal behavior. Musc: No edema or tenderness in extremities. Neuro: Alert Motor: Bilateral upper extremities: 5/5 proximal distal Right lower extremity: 4+/5 proximal distal Left lower extremity: Ankle plantarflexion 3+/5, otherwise 0/5 Sensation diminished to light touch left lower extremity  Skin:Left distal tibia with lesion healing; sutures in place- mild/minimal serosanguinous drainage on dressing- not evident currently from incision- been 13 days Assessment/Plan: 1. 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  Physiatrist is providing close team supervision and 24 hour management of active medical problems listed below.  Physiatrist and rehab team continue to assess barriers to discharge/monitor patient progress toward functional and medical goals  Care Tool:  Bathing  Bathing activity did not occur: Refused Body parts bathed by patient: Right arm, Left arm, Chest, Abdomen, Front perineal area, Right upper leg, Left upper leg, Face, Buttocks, Right lower leg, Left lower leg   Body parts bathed by helper: Buttocks, Right lower leg, Left lower leg     Bathing assist Assist Level: Supervision/Verbal cueing     Upper Body Dressing/Undressing Upper body dressing   What is the patient wearing?: Pull over shirt, Orthosis Orthosis activity level: Performed by patient  Upper body assist Assist Level: Set up assist    Lower Body Dressing/Undressing Lower body dressing      What is the patient wearing?: Underwear/pull up, Pants     Lower body assist Assist for lower body dressing: Moderate Assistance - Patient 50 - 74%     Toileting Toileting Toileting Activity did not occur Landscape architect and hygiene only): Refused  Toileting assist Assist for toileting: Minimal Assistance - Patient > 75%     Transfers Chair/bed transfer  Transfers assist     Chair/bed transfer assist level: Minimal Assistance - Patient > 75%     Locomotion Ambulation   Ambulation assist      Assist level: Minimal Assistance - Patient > 75% Assistive device: Walker-rolling Max distance: 45'   Walk 10 feet  activity   Assist     Assist level: Minimal Assistance - Patient > 75% Assistive device: Walker-rolling   Walk 50 feet activity   Assist Walk 50 feet with 2 turns activity did not occur: Safety/medical concerns         Walk 150 feet activity   Assist Walk 150 feet activity  did not occur: Safety/medical concerns         Walk 10 feet on uneven surface  activity   Assist Walk 10 feet on uneven surfaces activity did not occur: Safety/medical concerns         Wheelchair     Assist Will patient use wheelchair at discharge?: Yes Type of Wheelchair: Manual Wheelchair activity did not occur: Safety/medical concerns  Wheelchair assist level: Independent Max wheelchair distance: 150    Wheelchair 50 feet with 2 turns activity    Assist    Wheelchair 50 feet with 2 turns activity did not occur: Safety/medical concerns   Assist Level: Independent   Wheelchair 150 feet activity     Assist   CBG (last 3)  No results for input(s): GLUCAP in the last 72 hours.   Wheelchair 150 feet activity did not occur: Safety/medical concerns   Assist Level: Independent   Blood pressure 137/71, pulse 88, temperature 98.8 F (37.1 C), temperature source Oral, resp. rate 16, height 5\' 7"  (1.702 m), weight 60 kg, SpO2 100 %.  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  Continue CIR -Back brace when out of bed. -Plan is to follow-up outpatient Dr. Earlie Server in regards to chemotherapy  9/6- Chemo to start 05/30/2019, not 9/24, like originally planned  9/9- will see when sutures can come out- tomorrow is 14 days, however, on steroids. 2. Antithrombotics: -DVT/anticoagulation:SCDs.LE venous dopplers negative for DVT Will consider starting Lovenox  9/9- will discuss with pt tomorrow- plts up to 177k -antiplatelet therapy:  N/A 3. Pain Management:Neurontin 300 mg 3 times daily, Duragesic patch 75 mcg, Advil 400 mg 3 times daily oxycodone as needed  Ibuprofen changed to PRN on 9/5  Relatively controlled with medication on 9/7  4. Mood:motivational support -antipsychotic agents: N/A 5. Neuropsych: This patientiscapable of making decisions on herown behalf. 6. Skin/Wound Care:Routine skin checks -honeycomb dressing in place currently, plan to DC sutures soon  Continue foam dressing to left lower extremity  9/8- will d/c honeycomb dressing  9/9- will see when sutures to be removed 7. Fluids/Electrolytes/Nutrition:Routine in and outs  BMP within acceptable range on 9/7 8. Acute blood loss anemia.   Hemoglobin 10.0 on 9/7   Continue to monitor 9. Leukocytosis secondary to steroids. Continue to monitor  WBCs 24.6 on 9/7  Afebrile  Continue to monitor 10. Urinary retention:  Appears to be improving 11. Constipation in setting of Neurogenic bowel. Linzess daily. Adjust bowel program as needed  Bowel program with dig stim at night/evening.   Encouraged pt to take  Linzess; can hold miralax if she wants.  Miralax prn  Appears to be improving 12.  Steroid-induced hyperglycemia  Continue to monitor  13. Dispo  9/8- will try to arrange to deactivate her central port x 1 day so can take a shower.  LOS: 8 days A FACE TO FACE EVALUATION WAS PERFORMED  Aniceto Kyser  05/22/2019, 4:10 PM

## 2019-05-22 NOTE — Progress Notes (Signed)
Occupational Therapy Session Note  Patient Details  Name: Rebecca Lucas MRN: [id number] Date of Birth: 20-Feb-1956  Today's Date: 05/22/2019 OT Individual Time: 1000-1110 OT Individual Time Calculation (min): 70 min    Short Term Goals: Week 2:  OT Short Term Goal 1 (Week 2): STG=LTG due to LOS  Skilled Therapeutic Interventions/Progress Updates:    Pt seen for OT session focusing on functional mobility and Lucas neuro re-ed:  Transfers: Stand pivot transfers with RW and CGA. Included from w/c, therapy mat, standard chair and low soft recliner in ADL apartment.   Functional Ambulation: Ambulated in therapy gym around cones using RW with min in prep for more home like environment. Completed with min A overall with seated rest break following ~40ft. Added Lucas shoe cap to decrease drag and added #2 ankle weight to facilitate proprioceptive input.  Ambulated within ADL apartment on carpeted surface in simulation of home environment, min A overall using RW  Neuro re-ed: Dynamic reaching task in standing, reaching to Lucas in order to increase WBing through Lucas LE with therapist facilitating at knee. Completed x3 trials of 10 with seated rest break btwn trials.   ADL re-training: Problem solving positioning for LB dressing in order for pt to thread pants over Lucas LE as pt unable to flex hip to lift LE up. Following several trial/error, pt with most success with having foot supported on small step stool then using reacher to thread on pants.  Pt left seated in w/c with hand off to PT.   Therapy Documentation Precautions:  Precautions Precautions: Fall, Back Precaution Comments: reviewed spinal precautions Required Braces or Orthoses: Spinal Brace Spinal Brace: Lumbar corset, Applied in sitting position Restrictions Weight Bearing Restrictions: No Pain: Pain Assessment Pain Scale: 0-10 Pain Score: No/denies pain   Therapy/Group: Individual Therapy  Rebecca Lucas 05/22/2019, 7:01 AM

## 2019-05-22 NOTE — Progress Notes (Signed)
Radiation Oncology         [id number]) [phone] ________________________________  Name: AMBERA Lucas MRN: [id number]  Date: 05/22/2019  DOB: 18-Jul-1956  Post-treatment Note- Conducted via telephone due to current COVID-19 concerns for limiting patient exposure  CC: Caren Macadam, MD  Curt Bears, MD  Diagnosis:   63 y.o. 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  Interval Since Last Radiation:  1 month  04/05/19-04/18/19:  L1-L4 lumbar spine was re-treated to 30 Gy in 10 fractions  01/11/2019 - 01/25/2019//SBRT:  40Gy in 21factionsto the mid lumbar spine (L1-L3) and 30 Gy in 5 fractions to the bilateral sacroiliac metastases.  11/13/2018, 11/15/2018, 11/19/2018, 11/21/2018, 11/23/2018//SBRT:   50 Gy in 5 fractions at 10 Gy per fraction to the tumor in the left adrenal gland and the left periaortic node   03/29/18 - 04/20/18 : 1. 30 Gy directed to the RIGHT hip delivered in 10 fractions 2. 30 Gy directed to the RIGHT lung delivered in 10 fractions  03/21/2018, 03/23/2018, 03/26/2018, 03/28/2018, 03/30/2018//SBRT:   The cervical spine (C2-3) was treated to 50 Gy in 5 fractions of 10 Gy.  Narrative:  I spoke with the patient to conduct her routine scheduled 1 month follow up visit via telephone to spare the patient unnecessary potential exposure in the healthcare setting during the current COVID-19 pandemic.  The patient was notified in advance and gave permission to proceed with this visit format.  In summary, she was initially diagnosed with metastatic, poorly differentiated neoplasm, questionable for early NSCLC, presenting with a large right upper lobe lung mass and osseous metastases.  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  She elected to proceed with palliative radiation to the cervical spine, right hip and right lung which was completed in July 2019 and tolerated well.   She did experience significant improvement in her neck and hip pain after completion of radiotherapy.  She is status post 4 cycles of systemic therapy with carboplatin/Alimta/Keytruda under the care and direction of Dr. MEarlie Serverwith a partial response and was transitioned to maintenance treatment with Alimta/Keytruda on 07/04/18, now status post 10 cycles.  She has continued to tolerate treatment well.  On a follow up CT C/A/P on 10/16/2018 for disease restaging whe was noted to have a mixed response to treatment.  Her neck CT showed no evidence of progressive disease but CT C/A/P however, revealed significant increase in size of the left adrenal metastatic lesion, measuring 3cm as compared to 1.6 cm in 07/2018 and a 269mincrease in size of a left periaortic node, now measuring 1.1 cm.  The previously treated right upper lobe mass had decreased in size.  She elected to proceed with palliative radiotherapy/SBRT to the progressive metastases in the left adrenal gland and a left periaortic node which was completed on 11/23/18. She developed persistent central low back pain radiating into the L>R flanks and down the left leg with occasional mild numbness/tingling from hip to foot on the left.  On follow up systemic imaging with CT C/A/P on 12/18/18, there was stable scattered sclerotic osseous lesions but there was a low density anterior paraspinal fluid collection close to the known L2 fracture of unclear etiology but possibly concerning for discitis which prompted further evaluation with lumbar MRI on 01/01/19 given the patient's persistent low back pain.  MRI showed metastatic disease involving the L1, L2 and L3 vertebral bodies with severe  pathologic compression fracture of the L2 vertebral body with approximately 90% anterior height loss. No discitis or osteomyelitis but anterior paravertebral soft tissue component centered at L2 extending superiorly towards L1 and inferiorly towards L3 with heterogeneous enhancement  most concerning for extra osseous extension of tumor. There was small epidural tumor along the posterior aspect of the L2 vertebral body with left foraminal extension. Additionally ,there was metastatic disease involving the left posterior ilium, right sacrum and right ilium most consistent with metastatic disease and a pathologic nondisplaced fracture of the right sacral ala.  She was treated with SBRT to L1-L3 and bilateral SI jts in 01/2019 which was tolerated very well.  She continued on maintenance systemic therapy with Alimta and Keytruda under the care and direction of Dr. Julien Nordmann with recent restaging systemic imaging from 02/19/19 indicating stable disease.    Unfortunately, she continued with ongoing low back pain and a follow up MRI scan from 03/21/2019 showed progressed and bulky spinal and paraspinal tumor with circumferential epidural tumor greater on the left and extraosseous extension of tumor encompassing 78 x 62 x 67 mm from L1 - L3 with new moderate to severe malignant spinal and left lateral recess stenosis.  Given the short interval progression, the initial  recommendation was for neurosurgery evaluation. She was evaluated with Dr. Venetia Constable in neurosurgery on Friday 04/01/19, in Dr. Melven Sartorius absence, to discuss the potential role of surgical intervention.  He felt that surgical intervention would be extensive and instead recommended additional palliative radiation if this was felt to be a safe alternative.  Dr. Tammi Klippel agreed that it would be safe to proceed with an additional 10 fractions of conventional radiotherapy directed to the progressive disease in L1-L4 which was completed 7/24-04/18/19.   She was admitted to the hospital on 05/07/19 with AUR secondary to disease progression in the lumbar spine. She had significant improvement in her low back pain following her recent radiation treatments but had continued with persistent LLE weakness and paraesthesias.  The left lower extremity weakness  and paresthesias were gradually progressive over the week prior to admission but more recently, she developed acute urinary retention which prompted emergency evaluation and admission on 05/07/2019. Total spine MRI imaging was performed on admission which revealed stable to improved previously treated metastatic disease in the cervical spine, no metastatic lesions in the thoracic spine but increased circumferential epidural tumor at L1-L2 and L2-L3 with increased tumor involvement of the L3 vertebral body which appeared refractory to her recent radiotherapy. There was worsening moderate to severe spinal canal stenosis and severe left lateral recess stenosis at both levels.  Neurosurgery was consulted and she met with Dr. Kathyrn Sheriff who performed urgent L2 laminectomy, bilateral pediculotomy for surgical decompression/tumor debulking and spinal stabilization from T12-L4 using alphatec screws and posterolateral arthrodesis, L1-L3 on 05/09/19. She was able to d/c the foley catheter 2 days post-op and has continued to make gradual progress with PT in in-patient rehab since that time.  She continues with significant weakness and decreased sensation in the LLE but is making gradual progress and is encouraged by her progress thus far.  Of note, the patient's systemic therapy with maintenance Alimta and Keytruda was recently discontinued due to evidence of disease progression in the spine and she was recently started on a new regimen of palliative systemic chemotherapy with docetaxel and Cyramza with her first dose on 04/24/2019 which she tolerated well.  Her systemic therapy has been on hold during this recent hospitalization but she continues in close follow-up  under the care and direction of Dr. Earlie Server for management of her systemic disease and plans to resume systemic therapy on 05/30/19, once she has discharged from rehab.  On review of systems, the patient states that she is doing well overall. She reports  significant improvement in her low back pain and hips bilaterally.  She continues with significant weakness and decreased sensation in the LLE but is making gradual progress with PT and is encouraged by her progress thus far. She specifically denies chest pain, increased shortness of breath, hemoptysis, productive cough, fever, chills or night sweats.  She reports a decent appetite and is maintaining her weight.  She denies abdominal pain, nausea, vomiting, diarrhea or constipation.  She has not had recent headaches, changes in visual or auditory acuity, dizziness, imbalance or tremor.  ALLERGIES:  has No Known Allergies.  Meds: No current facility-administered medications for this encounter.    No current outpatient medications on file.   Facility-Administered Medications Ordered in Other Encounters  Medication Dose Route Frequency Provider Last Rate Last Dose   acetaminophen (TYLENOL) tablet 650 mg  650 mg Oral Q6H Angiulli, Lavon Paganini, PA-C   650 mg at 05/22/19 0946   bisacodyl (DULCOLAX) suppository 10 mg  10 mg Rectal Daily Lovorn, Megan, MD   10 mg at 05/15/19 1842   Chlorhexidine Gluconate Cloth 2 % PADS 6 each  6 each Topical Q0600 Lovorn, Jinny Blossom, MD   6 each at 05/20/19 0301   dexamethasone (DECADRON) tablet 6 mg  6 mg Oral Q12H AngiulliLavon Paganini, PA-C   6 mg at 05/22/19 0810   fentaNYL (DURAGESIC) 75 MCG/HR 1 patch  1 patch Transdermal Q72H Cathlyn Parsons, PA-C   1 patch at 05/21/19 2010   gabapentin (NEURONTIN) capsule 300 mg  300 mg Oral TID Cathlyn Parsons, PA-C   300 mg at 05/22/19 1252   ibuprofen (ADVIL) tablet 400 mg  400 mg Oral TID PRN Lovorn, Jinny Blossom, MD       linaclotide Rolan Lipa) capsule 72 mcg  72 mcg Oral QAC breakfast Lovorn, Jinny Blossom, MD   72 mcg at 05/22/19 0604   ondansetron (ZOFRAN) tablet 4 mg  4 mg Oral Q6H PRN Cathlyn Parsons, PA-C       Or   ondansetron [facility]) injection 4 mg  4 mg Intravenous Q6H PRN Angiulli, Lavon Paganini, PA-C       oxyCODONE (Oxy  IR/ROXICODONE) immediate release tablet 10 mg  10 mg Oral Q4H PRN Cathlyn Parsons, PA-C   10 mg at 05/22/19 5885   pantoprazole (PROTONIX) EC tablet 40 mg  40 mg Oral Daily Cathlyn Parsons, PA-C   40 mg at 05/22/19 0810   polyethylene glycol (MIRALAX / GLYCOLAX) packet 17 g  17 g Oral Daily PRN Lovorn, Megan, MD       senna-docusate (Senokot-S) tablet 1 tablet  1 tablet Oral QHS PRN Angiulli, Lavon Paganini, PA-C       sodium chloride flush (NS) 0.9 % injection 10-40 mL  10-40 mL Intracatheter PRN Lovorn, Megan, MD       sorbitol 70 % solution 30 mL  30 mL Oral Daily PRN Cathlyn Parsons, PA-C        Physical Findings:  vitals were not taken for this visit.   Karen Kays to assess due to telephone follow up visit format.  Lab Findings: Lab Results  Component Value Date   WBC 24.6 (H) 05/20/2019   HGB 10.0 (L) 05/20/2019   HCT 31.4 (L)  05/20/2019   MCV 99.7 05/20/2019   PLT 207 05/20/2019     Radiographic Findings: Dg Thoracolumabar Spine  Result Date: 05/09/2019 CLINICAL DATA:  Posterior fusion EXAM: DG C-ARM 1-60 MIN; THORACOLUMBAR SPINE - 2 VIEW COMPARISON:  CT 08/08/2019 FINDINGS: Short intraoperative spot images demonstrate fusion across the L2 compression fracture from T12 to L4. No hardware complicating feature. IMPRESSION: Posterior fusion as above.  No visible complicating feature. Electronically Signed   By: Rolm Baptise M.D.   On: 05/09/2019 15:57   Ct Lumbar Spine Wo Contrast  Result Date: 05/08/2019 CLINICAL DATA:  Metastatic cancer lumbar spine. Preop evaluation. Lung cancer. EXAM: CT LUMBAR SPINE WITHOUT CONTRAST TECHNIQUE: Multidetector CT imaging of the lumbar spine was performed without intravenous contrast administration. Multiplanar CT image reconstructions were also generated. COMPARISON:  Lumbar MRI 02/04/2019 FINDINGS: Segmentation: Normal Alignment: Mild anterolisthesis L1-2 related to fracture. Otherwise normal alignment. Vertebrae: Metastatic disease L1, L2,  and L3 vertebral bodies as noted on MRI. Severe pathologic fracture of L2. Tumor is prominently sclerotic in appearance by CT. There is destructive change in the anterior superior L3 vertebral body. Pathologic fracture of the pedicle of L2 bilaterally. Nonpathologic appearing fracture of the right L2 transverse process. Stable metastatic disease in the right sacral ala. Paraspinal and other soft tissues: Extensive paraspinous soft tissue thickening around the pathologic fracture of L1 compatible with hemorrhage and tumor extending into the soft tissues and retroperitoneum. No enlarged lymph nodes in the retroperitoneum. Disc levels: T12-L1: Negative L1-2: Epidural tumor in the spinal canal left greater than right is better seen by MRI than CT. There is tumor in the left foramen with left foraminal encroachment as well as moderate spinal stenosis as noted on MRI. L2-3: Epidural tumor extending to the left is best seen on MRI and is extending the foramen causing left foraminal encroachment. Moderate spinal stenosis. L3-4: Mild disc bulging without stenosis L4-5: Negative L5-S1: Negative IMPRESSION: Metastatic disease L1, L2, and L3 vertebral bodies with pathologic fracture of L2. Epidural tumor in the canal central and left-sided at L1-2 and L2-3 causing spinal stenosis best visualized on MRI. Pathologic fracture L2 pedicle bilaterally. Nonpathologic fracture right L2 transverse process Metastatic disease right sacral ala stable. Electronically Signed   By: Franchot Gallo M.D.   On: 05/08/2019 13:18   Mr Cervical Spine W Or Wo Contrast  Result Date: 05/07/2019 CLINICAL DATA:  Urinary retention since 5 a.m. History of metastatic poorly differentiated neoplasm, possibly lung cancer. Currently on chemotherapy. EXAM: MRI CERVICAL, THORACIC AND LUMBAR SPINE WITHOUT AND WITH CONTRAST TECHNIQUE: Multiplanar and multiecho pulse sequences of the cervical spine, to include the craniocervical junction and cervicothoracic  junction, and thoracic and lumbar spine, were obtained without and with intravenous contrast. CONTRAST:  5 mL Gadavist intravenous contrast. COMPARISON:  MRI lumbar spine dated March 21, 2019. CT chest, abdomen, and pelvis dated February 19, 2019. CT neck dated October 16, 2018. MRI cervical spine dated February 24, 2018. FINDINGS: MRI CERVICAL SPINE FINDINGS Alignment: New 2 mm anterolisthesis at C2-C3. Vertebrae: Enhancing expansile lesion in the C2 and C3 posterior elements is similar to CT neck from February, and significantly decreased in size since cervical spine MRI and June 2019. No new suspicious bone lesion. No fracture or evidence of discitis. Cord: Normal signal and morphology.  No intradural enhancement. Posterior Fossa, vertebral arteries, paraspinal tissues: Negative. Disc levels: C2-C3: Negative disc. Mild bilateral facet arthropathy. No stenosis. C3-C4: Unchanged small left paracentral disc protrusion. No stenosis. C4-C5: Unchanged mild disc bulging  eccentric to the left and mild bilateral facet uncovertebral hypertrophy. No stenosis. C5-C6: Unchanged mild disc bulging and bilateral facet uncovertebral hypertrophy. Unchanged mild left neuroforaminal stenosis. No spinal canal or right neuroforaminal stenosis. C6-C7:  Unchanged small central disc protrusion.  No stenosis. C7-T1:  Negative. MRI THORACIC SPINE FINDINGS Alignment:  Physiologic. Vertebrae: No fracture, evidence of discitis, or bone lesion. Cord:  Normal signal and morphology.  No intradural enhancement. Paraspinal and other soft tissues: Stable radiation changes in the posterior right upper lobe. Slightly increased small right pleural effusion. New trace left pleural effusion. Disc levels: Small central disc protrusions and minimal disc bulging from T7-T8 through T11-T12. No spinal canal or neuroforaminal stenosis. MRI LUMBAR SPINE FINDINGS Segmentation:  Standard. Alignment:  Unchanged mild retrolisthesis of L2. Vertebrae: Again seen is tumor  involving L1, L2, and L3 vertebral bodies. Tumor involvement of the L3 vertebral body appears increased. Unchanged severe pathologic L2 compression fracture. Unchanged tumor involving the L2 posterior elements. Anterior extraosseous extension of tumor is grossly unchanged. Conus medullaris and cauda equina: Conus extends to the L1 level. Conus and cauda equina appear normal. No intradural enhancement. Paraspinal and other soft tissues: Right sacral ala and left posterior iliac metastases are stable to slightly decreased in size. Disc levels: T12-L1: Unchanged small left paracentral disc protrusion. No stenosis. L1-L2 and L2-L3: Circumferential epidural tumor, worse on the left, has progressed since the prior study, with worsening moderate to severe spinal canal stenosis and severe left lateral recess stenosis at both levels. Unchanged complete tumor infiltration of the left L1-L2 and L2-L3 neural foramina and moderate infiltration of the right L2-L3 neural foramen. L3-L4:  Negative. L4-L5:  Negative. L5-S1:  Negative. IMPRESSION: Cervical spine: 1. Grossly unchanged osseous metastatic disease involving the C2 and C3 posterior elements when compared to neck CT from February 2020. Lesions have significantly decreased in size since MRI cervical spine from June 2019. 2. No progressive metastatic disease. 3. Unchanged mild multilevel cervical spondylosis as described above. No high-grade stenosis. Thoracic spine: 1. No evidence of osseous metastatic disease. 2. Stable radiation changes in the posterior right upper lobe. 3. Slightly increased small right pleural effusion. New trace left pleural effusion. Lumbar spine: 1. Progressive bulky spinal and paraspinal tumor from L1 through L3, with greater involvement of the L3 vertebral body and increased left-sided epidural tumor resulting in progressive moderate to severe spinal canal stenosis and severe left lateral recess stenosis at L1-L2 and L2-L3. 2. Grossly unchanged  involvement of the left L1-L2 and bilateral L2-L3 neural foramina. 3. Stable pathologic compression fracture of L2 with mild retropulsion. 4. Right sacral ala and left posterior iliac metastases are stable to slightly decreased in size. Electronically Signed   By: Titus Dubin M.D.   On: 05/07/2019 16:58   Mr Thoracic Spine W Wo Contrast  Result Date: 05/07/2019 CLINICAL DATA:  Urinary retention since 5 a.m. History of metastatic poorly differentiated neoplasm, possibly lung cancer. Currently on chemotherapy. EXAM: MRI CERVICAL, THORACIC AND LUMBAR SPINE WITHOUT AND WITH CONTRAST TECHNIQUE: Multiplanar and multiecho pulse sequences of the cervical spine, to include the craniocervical junction and cervicothoracic junction, and thoracic and lumbar spine, were obtained without and with intravenous contrast. CONTRAST:  5 mL Gadavist intravenous contrast. COMPARISON:  MRI lumbar spine dated March 21, 2019. CT chest, abdomen, and pelvis dated February 19, 2019. CT neck dated October 16, 2018. MRI cervical spine dated February 24, 2018. FINDINGS: MRI CERVICAL SPINE FINDINGS Alignment: New 2 mm anterolisthesis at C2-C3. Vertebrae: Enhancing expansile lesion  in the C2 and C3 posterior elements is similar to CT neck from February, and significantly decreased in size since cervical spine MRI and June 2019. No new suspicious bone lesion. No fracture or evidence of discitis. Cord: Normal signal and morphology.  No intradural enhancement. Posterior Fossa, vertebral arteries, paraspinal tissues: Negative. Disc levels: C2-C3: Negative disc. Mild bilateral facet arthropathy. No stenosis. C3-C4: Unchanged small left paracentral disc protrusion. No stenosis. C4-C5: Unchanged mild disc bulging eccentric to the left and mild bilateral facet uncovertebral hypertrophy. No stenosis. C5-C6: Unchanged mild disc bulging and bilateral facet uncovertebral hypertrophy. Unchanged mild left neuroforaminal stenosis. No spinal canal or right  neuroforaminal stenosis. C6-C7:  Unchanged small central disc protrusion.  No stenosis. C7-T1:  Negative. MRI THORACIC SPINE FINDINGS Alignment:  Physiologic. Vertebrae: No fracture, evidence of discitis, or bone lesion. Cord:  Normal signal and morphology.  No intradural enhancement. Paraspinal and other soft tissues: Stable radiation changes in the posterior right upper lobe. Slightly increased small right pleural effusion. New trace left pleural effusion. Disc levels: Small central disc protrusions and minimal disc bulging from T7-T8 through T11-T12. No spinal canal or neuroforaminal stenosis. MRI LUMBAR SPINE FINDINGS Segmentation:  Standard. Alignment:  Unchanged mild retrolisthesis of L2. Vertebrae: Again seen is tumor involving L1, L2, and L3 vertebral bodies. Tumor involvement of the L3 vertebral body appears increased. Unchanged severe pathologic L2 compression fracture. Unchanged tumor involving the L2 posterior elements. Anterior extraosseous extension of tumor is grossly unchanged. Conus medullaris and cauda equina: Conus extends to the L1 level. Conus and cauda equina appear normal. No intradural enhancement. Paraspinal and other soft tissues: Right sacral ala and left posterior iliac metastases are stable to slightly decreased in size. Disc levels: T12-L1: Unchanged small left paracentral disc protrusion. No stenosis. L1-L2 and L2-L3: Circumferential epidural tumor, worse on the left, has progressed since the prior study, with worsening moderate to severe spinal canal stenosis and severe left lateral recess stenosis at both levels. Unchanged complete tumor infiltration of the left L1-L2 and L2-L3 neural foramina and moderate infiltration of the right L2-L3 neural foramen. L3-L4:  Negative. L4-L5:  Negative. L5-S1:  Negative. IMPRESSION: Cervical spine: 1. Grossly unchanged osseous metastatic disease involving the C2 and C3 posterior elements when compared to neck CT from February 2020. Lesions have  significantly decreased in size since MRI cervical spine from June 2019. 2. No progressive metastatic disease. 3. Unchanged mild multilevel cervical spondylosis as described above. No high-grade stenosis. Thoracic spine: 1. No evidence of osseous metastatic disease. 2. Stable radiation changes in the posterior right upper lobe. 3. Slightly increased small right pleural effusion. New trace left pleural effusion. Lumbar spine: 1. Progressive bulky spinal and paraspinal tumor from L1 through L3, with greater involvement of the L3 vertebral body and increased left-sided epidural tumor resulting in progressive moderate to severe spinal canal stenosis and severe left lateral recess stenosis at L1-L2 and L2-L3. 2. Grossly unchanged involvement of the left L1-L2 and bilateral L2-L3 neural foramina. 3. Stable pathologic compression fracture of L2 with mild retropulsion. 4. Right sacral ala and left posterior iliac metastases are stable to slightly decreased in size. Electronically Signed   By: Titus Dubin M.D.   On: 05/07/2019 16:58   Mr Lumbar Spine W Wo Contrast  Result Date: 05/07/2019 CLINICAL DATA:  Urinary retention since 5 a.m. History of metastatic poorly differentiated neoplasm, possibly lung cancer. Currently on chemotherapy. EXAM: MRI CERVICAL, THORACIC AND LUMBAR SPINE WITHOUT AND WITH CONTRAST TECHNIQUE: Multiplanar and multiecho pulse sequences  of the cervical spine, to include the craniocervical junction and cervicothoracic junction, and thoracic and lumbar spine, were obtained without and with intravenous contrast. CONTRAST:  5 mL Gadavist intravenous contrast. COMPARISON:  MRI lumbar spine dated March 21, 2019. CT chest, abdomen, and pelvis dated February 19, 2019. CT neck dated October 16, 2018. MRI cervical spine dated February 24, 2018. FINDINGS: MRI CERVICAL SPINE FINDINGS Alignment: New 2 mm anterolisthesis at C2-C3. Vertebrae: Enhancing expansile lesion in the C2 and C3 posterior elements is similar to CT  neck from February, and significantly decreased in size since cervical spine MRI and June 2019. No new suspicious bone lesion. No fracture or evidence of discitis. Cord: Normal signal and morphology.  No intradural enhancement. Posterior Fossa, vertebral arteries, paraspinal tissues: Negative. Disc levels: C2-C3: Negative disc. Mild bilateral facet arthropathy. No stenosis. C3-C4: Unchanged small left paracentral disc protrusion. No stenosis. C4-C5: Unchanged mild disc bulging eccentric to the left and mild bilateral facet uncovertebral hypertrophy. No stenosis. C5-C6: Unchanged mild disc bulging and bilateral facet uncovertebral hypertrophy. Unchanged mild left neuroforaminal stenosis. No spinal canal or right neuroforaminal stenosis. C6-C7:  Unchanged small central disc protrusion.  No stenosis. C7-T1:  Negative. MRI THORACIC SPINE FINDINGS Alignment:  Physiologic. Vertebrae: No fracture, evidence of discitis, or bone lesion. Cord:  Normal signal and morphology.  No intradural enhancement. Paraspinal and other soft tissues: Stable radiation changes in the posterior right upper lobe. Slightly increased small right pleural effusion. New trace left pleural effusion. Disc levels: Small central disc protrusions and minimal disc bulging from T7-T8 through T11-T12. No spinal canal or neuroforaminal stenosis. MRI LUMBAR SPINE FINDINGS Segmentation:  Standard. Alignment:  Unchanged mild retrolisthesis of L2. Vertebrae: Again seen is tumor involving L1, L2, and L3 vertebral bodies. Tumor involvement of the L3 vertebral body appears increased. Unchanged severe pathologic L2 compression fracture. Unchanged tumor involving the L2 posterior elements. Anterior extraosseous extension of tumor is grossly unchanged. Conus medullaris and cauda equina: Conus extends to the L1 level. Conus and cauda equina appear normal. No intradural enhancement. Paraspinal and other soft tissues: Right sacral ala and left posterior iliac metastases  are stable to slightly decreased in size. Disc levels: T12-L1: Unchanged small left paracentral disc protrusion. No stenosis. L1-L2 and L2-L3: Circumferential epidural tumor, worse on the left, has progressed since the prior study, with worsening moderate to severe spinal canal stenosis and severe left lateral recess stenosis at both levels. Unchanged complete tumor infiltration of the left L1-L2 and L2-L3 neural foramina and moderate infiltration of the right L2-L3 neural foramen. L3-L4:  Negative. L4-L5:  Negative. L5-S1:  Negative. IMPRESSION: Cervical spine: 1. Grossly unchanged osseous metastatic disease involving the C2 and C3 posterior elements when compared to neck CT from February 2020. Lesions have significantly decreased in size since MRI cervical spine from June 2019. 2. No progressive metastatic disease. 3. Unchanged mild multilevel cervical spondylosis as described above. No high-grade stenosis. Thoracic spine: 1. No evidence of osseous metastatic disease. 2. Stable radiation changes in the posterior right upper lobe. 3. Slightly increased small right pleural effusion. New trace left pleural effusion. Lumbar spine: 1. Progressive bulky spinal and paraspinal tumor from L1 through L3, with greater involvement of the L3 vertebral body and increased left-sided epidural tumor resulting in progressive moderate to severe spinal canal stenosis and severe left lateral recess stenosis at L1-L2 and L2-L3. 2. Grossly unchanged involvement of the left L1-L2 and bilateral L2-L3 neural foramina. 3. Stable pathologic compression fracture of L2 with mild retropulsion. 4.  Right sacral ala and left posterior iliac metastases are stable to slightly decreased in size. Electronically Signed   By: Titus Dubin M.D.   On: 05/07/2019 16:58   Dg C-arm 1-60 Min  Result Date: 05/09/2019 CLINICAL DATA:  Posterior fusion EXAM: DG C-ARM 1-60 MIN; THORACOLUMBAR SPINE - 2 VIEW COMPARISON:  CT 08/08/2019 FINDINGS: Short  intraoperative spot images demonstrate fusion across the L2 compression fracture from T12 to L4. No hardware complicating feature. IMPRESSION: Posterior fusion as above.  No visible complicating feature. Electronically Signed   By: Rolm Baptise M.D.   On: 05/09/2019 15:57   Vas [place] Lower Extremity Venous (dvt)  Result Date: 05/14/2019  Lower Venous Study Indications: Edema.  Comparison Study: no prior Performing Technologist: Abram Sander RVS  Examination Guidelines: A complete evaluation includes B-mode imaging, spectral Doppler, color Doppler, and power Doppler as needed of all accessible portions of each vessel. Bilateral testing is considered an integral part of a complete examination. Limited examinations for reoccurring indications may be performed as noted.  +---------+---------------+---------+-----------+----------+--------------+  RIGHT     Compressibility Phasicity Spontaneity Properties Thrombus Aging  +---------+---------------+---------+-----------+----------+--------------+  CFV       Full            Yes       Yes                                    +---------+---------------+---------+-----------+----------+--------------+  SFJ       Full                                                             +---------+---------------+---------+-----------+----------+--------------+  FV Prox   Full                                                             +---------+---------------+---------+-----------+----------+--------------+  FV Mid    Full                                                             +---------+---------------+---------+-----------+----------+--------------+  FV Distal Full                                                             +---------+---------------+---------+-----------+----------+--------------+  PFV       Full                                                             +---------+---------------+---------+-----------+----------+--------------+  POP  Full            Yes        Yes                                    +---------+---------------+---------+-----------+----------+--------------+  PTV       Full                                                             +---------+---------------+---------+-----------+----------+--------------+  PERO      Full                                                             +---------+---------------+---------+-----------+----------+--------------+   +---------+---------------+---------+-----------+----------+--------------+  LEFT      Compressibility Phasicity Spontaneity Properties Thrombus Aging  +---------+---------------+---------+-----------+----------+--------------+  CFV       Full            Yes       Yes                                    +---------+---------------+---------+-----------+----------+--------------+  SFJ       Full                                                             +---------+---------------+---------+-----------+----------+--------------+  FV Prox   Full                                                             +---------+---------------+---------+-----------+----------+--------------+  FV Mid    Full                                                             +---------+---------------+---------+-----------+----------+--------------+  FV Distal Full                                                             +---------+---------------+---------+-----------+----------+--------------+  PFV       Full                                                             +---------+---------------+---------+-----------+----------+--------------+  POP       Full            Yes       Yes                                    +---------+---------------+---------+-----------+----------+--------------+  PTV       Full                                                             +---------+---------------+---------+-----------+----------+--------------+  PERO                                                       Not visualized   +---------+---------------+---------+-----------+----------+--------------+     Summary: Right: There is no evidence of deep vein thrombosis in the lower extremity. No cystic structure found in the popliteal fossa. Left: There is no evidence of deep vein thrombosis in the lower extremity. No cystic structure found in the popliteal fossa.  *See table(s) above for measurements and observations. Electronically signed by Harold Barban MD on 05/14/2019 at 3:19:19 PM.    Final     Impression/Plan:  This visit was conducted via telephone to spare the patient unnecessary potential exposure in the healthcare setting during the current COVID-19 pandemic.  1.  63 y.o. 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 She has recovered well from the effects of recent radiotherapy and had significant improvement in her low back and hip pain.  Unfortunately, despite her recent radiotherapy, she had continued progression of disease in the lumbar spine requiring urgent surgical decompression/tumor debulking and spinal stabilization on 05/09/19. She continues in recovery, working with PT in inpatient rehab at [facility] and is making gradual progress with an anticipated discharge date on 05/29/19.  She is encouraged by her progress to date. We discussed that while we are happy to continue to participate in her care if clinically indicated, at this point, we will plan to see her back on an as-needed basis.  She will continue in routine follow-up for continued disease management under the care and direction of Dr. Earlie Server.  The current plan is to resume palliative systemic chemotherapy with docetaxel and Cyramza on 05/30/19.  She will continue with serial systemic imaging and prefers to only have repeat MRI scans should she develop progressive symptoms or need further evaluation for any concerning findings on her CT scans.  She appears to have a good understanding of  her overall disease and our recommendations and is comfortable and in agreement with the stated plan.  She knows to call at anytime with any questions or concerns related to her previous radiotherapy.   Nicholos Johns, MMS, PA-C [facility] [place]: [phone]   Fax: [phone] .

## 2019-05-22 NOTE — Progress Notes (Signed)
Physical Therapy Weekly Progress Note  Patient Details  Name: Rebecca Lucas MRN: [id number] Date of Birth: [birth year]  Beginning of progress report period: May 15, 2019 End of progress report period: May 22, 2019  Today's Date: 05/22/2019 PT Individual Time: 7262-0355; 9741-6384; 1500-1530 PT Individual Time Calculation (min): 45 min and 45 min and 30 min  Patient has met 3 of 3 short term goals.  Pt is able to complete bed mobility with Supervision, squat pivot transfers with CGA to min A, stand pivot transfers with min A, gait up to 45' with RW and min A with use of a LLE AFO, and is independent with w/c mobility. Pt has made great progress over the past week.  Patient continues to demonstrate the following deficits muscle weakness, abnormal tone, unbalanced muscle activation and decreased coordination and decreased standing balance, decreased postural control and decreased balance strategies and therefore will continue to benefit from skilled PT intervention to increase functional independence with mobility.  Patient progressing toward long term goals..  Continue plan of care.  PT Short Term Goals Week 1:  PT Short Term Goal 1 (Week 1): Pt will be able to complete transfers with min A consistently PT Short Term Goal 1 - Progress (Week 1): Met PT Short Term Goal 2 (Week 1): Pt will ambulate x 50 ft with LRAD and mod A PT Short Term Goal 2 - Progress (Week 1): Met PT Short Term Goal 3 (Week 1): Pt will initiate w/c mobility PT Short Term Goal 3 - Progress (Week 1): Met Week 2:  PT Short Term Goal 1 (Week 2): =LTG due to ELOS  Skilled Therapeutic Interventions/Progress Updates:    Session 1: Pt received sidelying in bed, agreeable to PT session. No complaints of pain. Bed mobility Supervision. Dependent to don TED hose, shoes, and L GRAFO while seated EOB. Squat pivot transfer bed to w/c with min A. Manual w/c propulsion x 150 ft with use of BUE at mod I level. Sit to stand  with min A to RW throughout session. Ambulation 2 x 45 ft with RW and min A with L GRAFO, first bout without assist for LLE management, 2nd bout with manual assist for L knee extension and heel contact in stance phase. Nustep level 3 x 5 min with use of BLE and manual assist for initiation of L hip extension. Stand pivot transfer w/c to recliner with min A and RW. Pt left seated in recliner in room with needs in reach at end of session.  Session 2: Pt received seated in w/c in gym handed off from OT session. No complaints of pain. Squat pivot transfer w/c to/from mat table with CGA. Session focus on LLE NMR while standing with RW in maxisky. Ascend/descend one 4" step with RW with manual cues for L knee control, 3 x 10 reps. Standing mini-squats 2 x 5 reps with manual cues for L knee control, pt is able to perform deeper squat while in maxisky. Standing LLE target taps with 2# weight for improved proprioceptive feedback. Pt demos improved LLE control and placement with added weight. Stand pivot transfer w/c to recliner with RW and min A. Pt left seated in recliner in room with needs in reach at end of session.  Session 3:  Pt received sidelying in bed, agreeable to PT session. No complaints of pain. Sidelying to sitting EOB with Supervision. Squat pivot transfer throughout session with CGA. Manual w/c propulsion x 150 ft mod I. Sit to stand with  min A to RW. Session focus on standing tolerance and LLE NMR. Standing balance with one UE support on RW while performing horseshoe toss with RUE, manual cues for L knee extension. Standing balance with no UE support and mod A with max manual cueing for L knee extension with standing lateral weight shift. Pt demos fair ability to stand on LLE. Assisted pt back to bed at end of session.  Therapy Documentation Precautions:  Precautions Precautions: Fall, Back Precaution Comments: reviewed spinal precautions Required Braces or Orthoses: Spinal Brace Spinal Brace:  Lumbar corset, Applied in sitting position Restrictions Weight Bearing Restrictions: No   Therapy/Group: Individual Therapy   Excell Seltzer, PT, DPT 05/22/2019, 11:59 AM

## 2019-05-22 NOTE — Patient Care Conference (Signed)
Inpatient RehabilitationTeam Conference and Plan of Care Update Date: 05/22/2019   Time: 9:50 AM    Patient Name: Rebecca Lucas      Medical Record Number: [id number]  Date of Birth: [birth year] Sex: Female         Room/Bed: 4W09C/4W09C-01 Payor Info: Payor: [employer] / Plan: [employer] / Product Type: *No Product type* /    Admitting Diagnosis: 3. SCI Team  Other NTSC dysf; Cauda equina syndrome; 16-17days   Admit Date/Time:  05/14/2019  3:58 PM Admission Comments: No comment available   Primary Diagnosis:  Cauda equina syndrome (HCC) Principal Problem: Cauda equina syndrome [facility])  Patient Active Problem List   Diagnosis Date Noted  . Steroid-induced hyperglycemia   . Leucocytosis   . Acute blood loss anemia   . Postoperative pain   . Bandemia   . Foot drop, left   . Neurogenic bowel   . Cauda equina syndrome ([place]) 05/14/2019  . Urinary retention   . Surgery, elective   . 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 Acute urinary retention 05/07/2019  . Elevated LFTs 05/07/2019  . 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 Oral thrush 04/04/2018  . Goals of care, counseling/discussion 04/04/2018  . Encounter for antineoplastic chemotherapy 04/04/2018  . Encounter for antineoplastic immunotherapy 04/04/2018  . 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 Metastatic malignant neoplasm ([place]) 03/06/2018  . Primary cancer of right upper lobe of lung ([place]) 03/06/2018  . Bone metastasis ([place]) 03/06/2018    Expected Discharge Date: Expected Discharge Date: 05/29/19  Team Members Present: Physician leading conference: Dr. Alysia Penna Social Worker Present: Lennart Pall, LCSW Nurse Present: Genene Churn, RN PT Present: Excell Seltzer, PT OT Present: [name] Rounds, OT SLP Present: Charolett Bumpers, SLP PPS Coordinator present : Gunnar Fusi, SLP     Current Status/Progress  Goal Weekly Team Focus  Medical   Pt's plts up to 177k, WBC down to 24.6k; incision healing; will wait to d/c sutures due to steroids- tapering them  d/c sutures  d/c sutures this week; fitted for L AFO   Bowel/Bladder   continent of bowel and bladder         Swallow/Nutrition/ Hydration   independent         ADL's   Min A functional transfers, min-mod A LB dressing, supervision bathing use AE, Steadying assist toileting  Supervision overall  ADL re-training, neuro re-ed, functional standing balance/endurance, family education and d/c planning   Mobility   x1 assist with RW  Supervision to min A overall  AFO assessment, LLE NMR   Communication             Safety/Cognition/ Behavioral Observations            Pain   refuses tyl but asks for pain med oxy every 4 hours ; pain 4-5/10         Skin   incision dry and intact on her back;          Rehab Goals Patient on target to meet rehab goals: Yes *See Care Plan and progress notes for long and short-term goals.     Barriers to Discharge  Current Status/Progress Possible Resolutions Date Resolved   Physician    Medical stability;Pending chemo/radiation;Neurogenic Bowel & Bladder;Wound Care  n/a  CGA- min assist except for standing- mod assist with RW with max cues  can't use estim  due to cancer; con't AFO, bracing, and assistive devices      Nursing                  PT                    OT                  SLP                SW                Discharge Planning/Teaching Needs:  Pt to d/c home with spouse able to provide 24/7 assistance.  Teaching to be planned closer to d/c.   Team Discussion:  Still with some back pain;  Honeycomb dressing still intact.  Supervision to min transfers; min- mod dressing.  Limited WB LLE.  Supervision bed mobility and min for gait.  AFO ordered.  Supervision to min goals overall.  Revisions to Treatment Plan:  NA    Continued Need for Acute Rehabilitation Level of Care: The patient  requires daily medical management by a physician with specialized training in physical medicine and rehabilitation for the following conditions: Daily direction of a multidisciplinary physical rehabilitation program to ensure safe treatment while eliciting the highest outcome that is of practical value to the patient.: Yes Daily medical management of patient stability for increased activity during participation in an intensive rehabilitation regime.: Yes Daily analysis of laboratory values and/or radiology reports with any subsequent need for medication adjustment of medical intervention for : Post surgical problems;Neurological problems;Wound care problems;Other   I attest that I was present, lead the team conference, and concur with the assessment and plan of the team.   Lennart Pall 05/23/2019, 10:28 AM    Team conference was held via web/ teleconference due to [place] - 19

## 2019-05-23 ENCOUNTER — Inpatient Hospital Stay (HOSPITAL_COMMUNITY): Payer: 59 | Admitting: Occupational Therapy

## 2019-05-23 ENCOUNTER — Inpatient Hospital Stay (HOSPITAL_COMMUNITY): Payer: 59 | Admitting: *Deleted

## 2019-05-23 ENCOUNTER — Inpatient Hospital Stay (HOSPITAL_COMMUNITY): Payer: 59

## 2019-05-23 ENCOUNTER — Inpatient Hospital Stay (HOSPITAL_COMMUNITY): Payer: 59 | Admitting: Physical Therapy

## 2019-05-23 ENCOUNTER — Other Ambulatory Visit: Payer: 59

## 2019-05-23 LAB — COMPREHENSIVE METABOLIC PANEL
ALT: 73 U/L — ABNORMAL HIGH (ref 0–44)
AST: 45 U/L — ABNORMAL HIGH (ref 15–41)
Albumin: 2.3 g/dL — ABNORMAL LOW (ref 3.5–5.0)
Alkaline Phosphatase: 95 U/L (ref 38–126)
Anion gap: 6 (ref 5–15)
BUN: 25 mg/dL — ABNORMAL HIGH (ref 8–23)
CO2: 26 mmol/L (ref 22–32)
Calcium: 8.4 mg/dL — ABNORMAL LOW (ref 8.9–10.3)
Chloride: 107 mmol/L (ref 98–111)
Creatinine, Ser: 0.63 mg/dL (ref 0.44–1.00)
GFR calc Af Amer: >60 mL/min (ref >60)
GFR calc non Af Amer: >60 mL/min (ref >60)
Glucose, Bld: 151 mg/dL — ABNORMAL HIGH (ref 70–99)
Potassium: 4.5 mmol/L (ref 3.5–5.1)
Sodium: 139 mmol/L (ref 135–145)
Total Bilirubin: 0.3 mg/dL (ref 0.3–1.2)
Total Protein: 5 g/dL — ABNORMAL LOW (ref 6.5–8.1)

## 2019-05-23 LAB — CBC WITH DIFFERENTIAL/PLATELET
Abs Immature Granulocytes: 1.03 10*3/uL — ABNORMAL HIGH (ref 0.00–0.07)
Basophils Absolute: 0.1 10*3/uL (ref 0.0–0.1)
Basophils Relative: 0 %
Eosinophils Absolute: 0 10*3/uL (ref 0.0–0.5)
Eosinophils Relative: 0 %
HCT: 30.3 % — ABNORMAL LOW (ref 36.0–46.0)
Hemoglobin: 9.5 g/dL — ABNORMAL LOW (ref 12.0–15.0)
Immature Granulocytes: 5 %
Lymphocytes Relative: 1 %
Lymphs Abs: 0.2 10*3/uL — ABNORMAL LOW (ref 0.7–4.0)
MCH: 31.9 pg (ref 26.0–34.0)
MCHC: 31.4 g/dL (ref 30.0–36.0)
MCV: 101.7 fL — ABNORMAL HIGH (ref 80.0–100.0)
Monocytes Absolute: 0.8 10*3/uL (ref 0.1–1.0)
Monocytes Relative: 4 %
Neutro Abs: 19.7 10*3/uL — ABNORMAL HIGH (ref 1.7–7.7)
Neutrophils Relative %: 90 %
Platelets: 228 10*3/uL (ref 150–400)
RBC: 2.98 MIL/uL — ABNORMAL LOW (ref 3.87–5.11)
RDW: 17.5 % — ABNORMAL HIGH (ref 11.5–15.5)
WBC: 21.8 10*3/uL — ABNORMAL HIGH (ref 4.0–10.5)
nRBC: 0 % (ref 0.0–0.2)

## 2019-05-23 MED ORDER — INFLUENZA VAC SPLIT QUAD 0.5 ML IM SUSY: 0.5000 mL | PREFILLED_SYRINGE | INTRAMUSCULAR | Status: DC

## 2019-05-23 MED ORDER — ENOXAPARIN SODIUM 40 MG/0.4ML ~~LOC~~ SOLN
40.0000 mg | SUBCUTANEOUS | Status: DC
  Administered 2019-05-23 – 2019-06-07 (×16): 40 mg via SUBCUTANEOUS
  Filled 2019-05-23 (×16): qty 0.4

## 2019-05-23 MED ORDER — DEXAMETHASONE 4 MG PO TABS
6.0000 mg | ORAL_TABLET | Freq: Every day | ORAL | Status: DC
  Administered 2019-05-24 – 2019-05-27 (×4): 6 mg via ORAL
  Filled 2019-05-23 (×5): qty 1

## 2019-05-23 MED ORDER — INFLUENZA VAC SPLIT QUAD 0.5 ML IM SUSY
0.5000 mL | PREFILLED_SYRINGE | Freq: Once | INTRAMUSCULAR | Status: AC
  Administered 2019-05-23: 0.5 mL via INTRAMUSCULAR
  Filled 2019-05-23: qty 0.5

## 2019-05-23 NOTE — Progress Notes (Signed)
Occupational Therapy Session Note  Patient Details  Name: Rebecca Lucas MRN: [id number] Date of Birth: Jan 14, 1956  Today's Date: 05/23/2019 OT Individual Time: 1500-1557 OT Individual Time Calculation (min): 57 min    Skilled Therapeutic Interventions/Progress Updates:    1:1. Pt received in bed in side lying with no pain reported and agreeable to OT. Pt completes sidelying>sitting EOB and dons LSO with set up. OT dons shoes/AFO for time management. Pt completes lateral scoot/stand pivot transfers with MIN A with VC for foot placement prior to power up EOB<>w/c<>bench. In ADL apartment, pt completes w/c level kitchen mobility with education on w/c placement, management of parts and safe reaching into various heights of surfaces. Pt educated on use of RW tray once ambulatory level is recommended at home to transport items in kitchen. Pt propels w/c to outside courtyard >300' for community mobility distances/BUE strengthening. Pt works on dynamic reaching laterally, anteriorly and crossing midline towards L with LLE on dyna-disc for sensory input into LLE during weight shifting. Pt cued to keep RLE off ground/underneath seat to increase input into LLE. Exited session with pt seated in bed, exit alarm on and call light in reach.  Therapy Documentation Precautions:  Precautions Precautions: Fall, Back Precaution Comments: reviewed spinal precautions Required Braces or Orthoses: Spinal Brace Spinal Brace: Lumbar corset, Applied in sitting position Restrictions Weight Bearing Restrictions: No General:   Vital Signs: Therapy Vitals Temp: 99.3 F (37.4 C) Temp Source: Oral Pulse Rate: 91 Resp: 18 BP: 116/74 Patient Position (if appropriate): Sitting Oxygen Therapy SpO2: 100 % O2 Device: Room Air Pain: Pain Assessment Pain Scale: 0-10 Pain Score: 4  Pain Type: Acute pain Pain Location: Back Pain Orientation: Lower Pain Descriptors / Indicators: Aching Pain Onset:  On-going Patients Stated Pain Goal: 3 Pain Intervention(s): Medication (See eMAR) ADL:   Vision   Perception    Praxis   Exercises:   Other Treatments:     Therapy/Group: Individual Therapy  Tonny Branch 05/23/2019, 4:11 PM

## 2019-05-23 NOTE — Progress Notes (Signed)
Physical Therapy Session Note  Patient Details  Name: Rebecca Lucas MRN: [id number] Date of Birth: [birth year]  Today's Date: 05/23/2019 PT Individual Time: 0800-0900 PT Individual Time Calculation (min): 60 min   Short Term Goals: Week 2:  PT Short Term Goal 1 (Week 2): =LTG due to ELOS  Skilled Therapeutic Interventions/Progress Updates:    Pt received sidelying in bed, agreeable to PT session. No complaints of pain. Bed mobility Supervision. Dependent to don TED hose, shoes, L AFO. Squat pivot transfer bed to w/c with CGA. Pt is at mod I level for w/c mobility. Sit to stand with min A to RW. Ambulation 4 x 45 ft with RW and CGA with use of L AFO. Added 2# weight to LLE for improved proprioceptive input. Pt exhibits improved L heel strike with use of weight. Pt also vaults with RLE to advance LLE. Added heel lift to R shoe, pt exhibits decreased vaulting with use of heel lift but does continue to vault to compensate for LLE weakness. Sidelying on therapy mat with table between LE for gravity eliminated therex. Focus on hip flex/ext and knee flex/ext with AAROM x 10 reps. Pt exhibits ability to perform hip flex/ext and knee flex in gravity eliminated position but continues to exhibit tract to 0/5 quad activation. Assisted pt back to bed at end of session.  Therapy Documentation Precautions:  Precautions Precautions: Fall, Back Precaution Comments: reviewed spinal precautions Required Braces or Orthoses: Spinal Brace Spinal Brace: Lumbar corset, Applied in sitting position Restrictions Weight Bearing Restrictions: No    Therapy/Group: Individual Therapy   Excell Seltzer, PT, DPT  05/23/2019, 12:24 PM

## 2019-05-23 NOTE — Plan of Care (Signed)
  Problem: Consults Goal: RH SPINAL CORD INJURY PATIENT EDUCATION Description:  See Patient Education module for education specifics.  Outcome: Progressing Goal: Skin Care Protocol Initiated - if Braden Score 18 or less Description: If consults are not indicated, leave blank or document N/A Outcome: Progressing   Problem: SCI BOWEL ELIMINATION Goal: RH STG MANAGE BOWEL WITH ASSISTANCE Description: STG Manage Bowel with mod I Assistance. Outcome: Progressing Goal: RH STG SCI MANAGE BOWEL WITH MEDICATION WITH ASSISTANCE Description: STG SCI Manage bowel with medication with mod I assistance. Outcome: Progressing Goal: RH STG SCI MANAGE BOWEL PROGRAM W/ASSIST OR AS APPROPRIATE Description: STG SCI Manage bowel program mod I assist or as appropriate. Outcome: Progressing   Problem: RH SKIN INTEGRITY Goal: RH STG MAINTAIN SKIN INTEGRITY WITH ASSISTANCE Description: STG Maintain Skin Integrity With mod I Assistance. Outcome: Progressing   Problem: RH PAIN MANAGEMENT Goal: RH STG PAIN MANAGED AT OR BELOW PT'S PAIN GOAL Description: < 4 Outcome: Progressing

## 2019-05-23 NOTE — Progress Notes (Addendum)
Occupational Therapy Session Note  Patient Details  Name: Rebecca Lucas MRN: [id number] Date of Birth: 07/23/1956  Today's Date: 05/23/2019 OT Individual Time: 1015-1130 OT Individual Time Calculation (min): 75 min    Short Term Goals: Week 2:  OT Short Term Goal 1 (Week 2): STG=LTG due to LOS  Skilled Therapeutic Interventions/Progress Updates:    Pt seen for OT ADL bathing/dressing session. Pt in side-lying upon arrival, denying pain and agreeable to tx session.   Self-care: Bathing seated on BSC in shower with supervision using LH sponge and functions of BSC to complete pericare/buttock hygiene. Dressed seated in w/c, mod I UB dressing. Threaded underwear with use of reacher and supervision, min A with VCs for technique to thread LEs into pants with assist for clothing management. Stood at [employer] with guarding assist to pull pants up. Total A to don L AFO and shoe.  Functional Ambulation and transfers : Ambulated x2 sets of ~10-47ft with min A overall with increased time and rest breaks btwn trials. Shower transfer w/c> BSC in shower. Entering shower stand pivot with use of grab bars and CGA. Mod-max A to exit shower via squat pivot as not enough room for pt to lean forward enough to power into standing.   Neuro re-ed: 4 minutes on NuStep, level 1, LE use only. Max A to begin L LE motion, able to complete movement into full extension. Increased time and effort to extend R LE.  Pt returned to room at end of session, left seated in recliner with all needs in reach.   Therapy Documentation Precautions:  Precautions Precautions: Fall, Back Precaution Comments: reviewed spinal precautions Required Braces or Orthoses: Spinal Brace Spinal Brace: Lumbar corset, Applied in sitting position Restrictions Weight Bearing Restrictions: No   Therapy/Group: Individual Therapy  Kazimierz Springborn L 05/23/2019, 10:52 AM

## 2019-05-23 NOTE — Evaluation (Signed)
Recreational Therapy Assessment and Plan  Patient Details  Name: Rebecca Lucas MRN: [id number] Date of Birth: 24-Apr-1956 Today's Date: 05/23/2019 Time:  1404-1430 Pain:  No c/o Rehab Potential:  Good ELOS:   9/16  Assessment Problem List:      Patient Active Problem List   Diagnosis Date Noted  . Foot drop, left   . Neurogenic bowel   . Cauda equina syndrome ([place]) 05/14/2019  . Urinary retention   . Surgery, elective   . 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 Acute urinary retention 05/07/2019  . Elevated LFTs 05/07/2019  . 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 Oral thrush 04/04/2018  . Goals of care, counseling/discussion 04/04/2018  . Encounter for antineoplastic chemotherapy 04/04/2018  . Encounter for antineoplastic immunotherapy 04/04/2018  . 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 Metastatic malignant neoplasm ([place]) 03/06/2018  . Primary cancer of right upper lobe of lung ([place]) 03/06/2018  . Bone metastasis ([place]) 03/06/2018    Past Medical History:      Past Medical History:  Diagnosis Date  . Cancer of upper lobe of right lung [facility])    Past Surgical History:       Past Surgical History:  Procedure Laterality Date  . APPLICATION OF ROBOTIC ASSISTANCE FOR SPINAL PROCEDURE N/A 05/09/2019   Procedure: APPLICATION OF ROBOTIC ASSISTANCE FOR SPINAL PROCEDURE;  Surgeon: Consuella Lose, MD;  Location: [place];  Service: Neurosurgery;  Laterality: N/A;  APPLICATION OF ROBOTIC ASSISTANCE FOR SPINAL PROCEDURE  . IR IMAGING GUIDED PORT INSERTION  06/26/2018  . 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 She had been on oral dexamethasone which had been weaned off. COVID negative, urine culture no growth, alkaline phosphatase 200, WBC 14,700. X-ray and imaging revealed progressed and bulky spinal and paraspinal tumor L1-L3 encompassing 78 x 62 x 67 mm. Subtotal involvement of the L2 vertebrae increased as compared to prior tracings. Circumferential epidural tumor substantially increased resulting in moderate to severe malalignment spinal and severe left lateral recess stenosis. Epidural tumor completely infiltrates the left L1 and L2 neural foramina partial erosion of the anterior endplates of L1 and L3 since April. Stable pathologic compression fractures of L2 with mild retropulsion. Visible right sacral and left iliac bone metastasis appears stable. Underwent L2 laminectomy, bilateral pediculotomyfor decompression of thecal sac and nerve roots with posterior segmental instrumentation T12-L4 and posterolateral arthrodesis L1-L3 05/09/2019 per Dr. Kathyrn Sheriff. Hospital course pain management. Back brace when out of bed applied in sitting position. Acute blood loss anemia 6.8 patient was transfused latest hemoglobin 9.7. Reactive leukocytosis due to systemic steroids and monitored with latest WBC 28,300. Hematology oncology  follow-up with Dr. Earlie Server and await plan for  chemotherapy. Therapy evaluations completed and patient was admitted for a comprehensive rehab program. Patient transferred to CIR on 05/14/2019 .   Pt presents with decreased activity tolerance, decreased functional mobility, decreased balance  Limiting pt's independence with leisure/community pursuits.   Plan Min 1 TR session >20 minutes during LOS  Recommendations for other services: None   Discharge Criteria: Patient will be discharged from TR if patient refuses treatment 3 consecutive times without medical reason.  If treatment goals not met, if there is a change in medical status, if patient makes no progress towards goals or if patient is discharged from hospital.  The above assessment, treatment plan, treatment alternatives and goals were discussed and mutually agreed upon: by patient  [place] 05/23/2019, 10:28 AM

## 2019-05-23 NOTE — Progress Notes (Signed)
Social Work Patient ID: Rebecca Lucas, female   DOB: 07-Nov-1955, 63 y.o.   MRN: [id number]  Have reviewed team conference with pt and spouse.  Both aware and agreeable with targeted d/c date of 9/16 and supervision goals overall.  Pt reports she is "very happy" with gains she is making and feels she will be ready for home next week.  Eliyah Mcshea, LCSW

## 2019-05-23 NOTE — Progress Notes (Addendum)
[facility] PHYSICAL MEDICINE & REHABILITATION PROGRESS NOTE   Subjective/Complaints: Pt reports bowels working well- got her a new carbon fiber L AFO that's she walking with and doing well. Wondering when sutures coming out- agreed on Monday after talking with team.  Also, her port hasn't been re-accessed- asking for that to be done- to be done after gets shower today.  ROS: Denies CP, shortness of breath, nausea, vomiting, diarrhea.  Objective:   No results found. No results for input(s): WBC, HGB, HCT, PLT in the last 72 hours. No results for input(s): NA, K, CL, CO2, GLUCOSE, BUN, CREATININE, CALCIUM in the last 72 hours.  Intake/Output Summary (Last 24 hours) at 05/23/2019 0910 Last data filed at 05/23/2019 0741 Gross per 24 hour  Intake 556 ml  Output -  Net 556 ml     Physical Exam: Vital Signs Blood pressure 118/70, pulse 97, temperature 98.3 F (36.8 C), temperature source Oral, resp. rate 18, height 5\' 7"  (1.702 m), weight 60 kg, SpO2 100 %. Constitutional: No distress . Vital signs reviewed. Walking with RW and L AFO min-mod assist with PT in gym; wearing headscarf, stable round facies from steroids, NAD HENT: Normocephalic.  Atraumatic. Eyes: EOMI. No discharge. Cardiovascular: RRR Respiratory: Normal effort.  No stridor. CTA b/l GI: Non-distended. NT, soft, (+)BS Skin: Warm and dry.  Intact. Psych: Normal mood.  Normal behavior. Musc: No edema or tenderness in extremities. Neuro: Alert Motor: Bilateral upper extremities: 5/5 proximal distal Right lower extremity: 4+/5 proximal distal Left lower extremity: Ankle plantarflexion 3+/5, otherwise 0/5 Sensation diminished to light touch left lower extremity  Skin:Left distal tibia with lesion healing; sutures in place- mild/minimal serosanguinous drainage on dressing- not evident currently from incision- been 13 days Assessment/Plan: 1. Functional deficits secondary to Cauda equina syndrome secondary to epidural  tumor, metastatic cancer of RU lung- was on chemotherapy prior to admission which require 3+ hours per day of interdisciplinary therapy in a comprehensive inpatient rehab setting.  Physiatrist is providing close team supervision and 24 hour management of active medical problems listed below.  Physiatrist and rehab team continue to assess barriers to discharge/monitor patient progress toward functional and medical goals  Care Tool:  Bathing  Bathing activity did not occur: Refused Body parts bathed by patient: Right arm, Left arm, Chest, Abdomen, Front perineal area, Right upper leg, Left upper leg, Face, Buttocks, Right lower leg, Left lower leg   Body parts bathed by helper: Buttocks, Right lower leg, Left lower leg     Bathing assist Assist Level: Supervision/Verbal cueing     Upper Body Dressing/Undressing Upper body dressing   What is the patient wearing?: Pull over shirt, Orthosis Orthosis activity level: Performed by patient  Upper body assist Assist Level: Set up assist    Lower Body Dressing/Undressing Lower body dressing      What is the patient wearing?: Underwear/pull up, Pants     Lower body assist Assist for lower body dressing: Moderate Assistance - Patient 50 - 74%     Toileting Toileting Toileting Activity did not occur Landscape architect and hygiene only): Refused  Toileting assist Assist for toileting: Minimal Assistance - Patient > 75%     Transfers Chair/bed transfer  Transfers assist     Chair/bed transfer assist level: Minimal Assistance - Patient > 75%     Locomotion Ambulation   Ambulation assist      Assist level: Minimal Assistance - Patient > 75% Assistive device: Walker-rolling Max distance: 45'   Walk 10  feet activity   Assist     Assist level: Minimal Assistance - Patient > 75% Assistive device: Walker-rolling   Walk 50 feet activity   Assist Walk 50 feet with 2 turns activity did not occur: Safety/medical  concerns         Walk 150 feet activity   Assist Walk 150 feet activity did not occur: Safety/medical concerns         Walk 10 feet on uneven surface  activity   Assist Walk 10 feet on uneven surfaces activity did not occur: Safety/medical concerns         Wheelchair     Assist Will patient use wheelchair at discharge?: Yes Type of Wheelchair: Manual Wheelchair activity did not occur: Safety/medical concerns  Wheelchair assist level: Independent Max wheelchair distance: 150    Wheelchair 50 feet with 2 turns activity    Assist    Wheelchair 50 feet with 2 turns activity did not occur: Safety/medical concerns   Assist Level: Independent   Wheelchair 150 feet activity     Assist   CBG (last 3)  No results for input(s): GLUCAP in the last 72 hours.   Wheelchair 150 feet activity did not occur: Safety/medical concerns   Assist Level: Independent   Blood pressure 118/70, pulse 97, temperature 98.3 F (36.8 C), temperature source Oral, resp. rate 18, height 5\' 7"  (1.702 m), weight 60 kg, SpO2 100 %.  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  Continue CIR -Back brace when out of bed. -Plan is to follow-up outpatient Dr. Earlie Server in regards to chemotherapy  9/6- Chemo to start 05/30/2019, not 9/24, like originally planned  9/9- will see when sutures can come out- tomorrow is 14 days, however, on steroids.  9/10- will remove Sutures on Monday 2. Antithrombotics: -DVT/anticoagulation:SCDs.LE venous dopplers negative for  DVT Will consider starting Lovenox  9/9- will discuss with pt tomorrow- plts up to 177k  9/10- pt agreed, will start Lovenox -antiplatelet therapy: N/A 3. Pain Management:Neurontin 300 mg 3 times daily, Duragesic patch 75 mcg, Advil 400 mg 3 times daily oxycodone as needed  Ibuprofen changed to PRN on 9/5  Relatively controlled with medication on 9/7  4. Mood:motivational support -antipsychotic agents: N/A 5. Neuropsych: This patientiscapable of making decisions on herown behalf. 6. Skin/Wound Care:Routine skin checks -honeycomb dressing in place currently, plan to DC sutures soon  Continue foam dressing to left lower extremity  9/8- will d/c honeycomb dressing  9/9- will see when sutures to be removed 7. Fluids/Electrolytes/Nutrition:Routine in and outs  BMP within acceptable range on 9/7 8. Acute blood loss anemia.   Hemoglobin 10.0 on 9/7   Continue to monitor 9. Leukocytosis secondary to steroids. Continue to monitor  WBCs 24.6 on 9/7  Afebrile  Continue to monitor 10. Urinary retention:  Appears to be improving 11. Constipation in setting of Neurogenic bowel. Linzess daily. Adjust bowel program as needed  Bowel program with dig stim at night/evening.   Encouraged pt to take  Linzess; can hold miralax if she wants.  Miralax prn  Appears to be improving 12.  Steroid-induced hyperglycemia  Continue to monitor  13. Dispo  9/8- will try to arrange to deactivate her central port x 1 day so can take a  shower.  9/10- will re-access today after shower; d/c date 9/16- and starts Chemo 9/17- father will be home with her at home.  LOS: 9 days A FACE TO FACE EVALUATION WAS PERFORMED  Cyntia Staley 05/23/2019, 9:10 AM

## 2019-05-24 ENCOUNTER — Inpatient Hospital Stay (HOSPITAL_COMMUNITY): Payer: 59 | Admitting: Physical Therapy

## 2019-05-24 ENCOUNTER — Inpatient Hospital Stay (HOSPITAL_COMMUNITY): Payer: 59 | Admitting: Occupational Therapy

## 2019-05-24 LAB — CBC WITH DIFFERENTIAL/PLATELET
Abs Immature Granulocytes: 0.95 10*3/uL — ABNORMAL HIGH (ref 0.00–0.07)
Basophils Absolute: 0.1 10*3/uL (ref 0.0–0.1)
Basophils Relative: 0 %
Eosinophils Absolute: 0 10*3/uL (ref 0.0–0.5)
Eosinophils Relative: 0 %
HCT: 31.1 % — ABNORMAL LOW (ref 36.0–46.0)
Hemoglobin: 9.6 g/dL — ABNORMAL LOW (ref 12.0–15.0)
Immature Granulocytes: 5 %
Lymphocytes Relative: 1 %
Lymphs Abs: 0.2 10*3/uL — ABNORMAL LOW (ref 0.7–4.0)
MCH: 31.7 pg (ref 26.0–34.0)
MCHC: 30.9 g/dL (ref 30.0–36.0)
MCV: 102.6 fL — ABNORMAL HIGH (ref 80.0–100.0)
Monocytes Absolute: 1.2 10*3/uL — ABNORMAL HIGH (ref 0.1–1.0)
Monocytes Relative: 6 %
Neutro Abs: 16.5 10*3/uL — ABNORMAL HIGH (ref 1.7–7.7)
Neutrophils Relative %: 88 %
Platelets: 214 10*3/uL (ref 150–400)
RBC: 3.03 MIL/uL — ABNORMAL LOW (ref 3.87–5.11)
RDW: 17.5 % — ABNORMAL HIGH (ref 11.5–15.5)
WBC: 18.9 10*3/uL — ABNORMAL HIGH (ref 4.0–10.5)
nRBC: 0.1 % (ref 0.0–0.2)

## 2019-05-24 MED ORDER — TRAZODONE HCL 50 MG PO TABS
50.0000 mg | ORAL_TABLET | Freq: Every evening | ORAL | Status: DC | PRN
  Administered 2019-05-27 – 2019-05-31 (×5): 50 mg via ORAL
  Filled 2019-05-24 (×6): qty 1

## 2019-05-24 NOTE — Progress Notes (Signed)
Physical Therapy Session Note  Patient Details  Name: Rebecca Lucas MRN: [id number] Date of Birth: Dec 28, 1955  Today's Date: 05/24/2019 PT Individual Time: 1115-1200; 3244-0102 PT Individual Time Calculation (min): 45 min and 70 min  Short Term Goals: Week 2:  PT Short Term Goal 1 (Week 2): =LTG due to ELOS  Skilled Therapeutic Interventions/Progress Updates:    Session 1: Pt received supine in bed having just finished bladder scan with NT. Supine to sit with min A. Squat pivot transfer bed to w/c with CGA. Manual w/c propulsion x 200 ft with use of BUE at mod I level. Squat pivot transfer w/c to/from mat table with CGA. Sit to supine with min A for LLE management. Supine BLE strengthening therex with AAROM for LLE: heel slides, hip abd, hip add squeeze; sidelying AAROM hip flex/ext and knee flex/ext. Supine LLE HS stretch 3 x 60 sec, gastroc stretch 3 x 60 sec. Supine to sit with min A. Squat pivot transfer back to w/c CGA. Stand pivot transfer w/c to recliner with RW and min A. Pt left seated in recliner in room with needs in reach at end of session.  Session 2: Pt received supine in bed, agreeable to PT session. No complaints of pain. Bed mobility min A for LLE management. Squat pivot transfer bed to w/c with CGA. Manual w/c propulsion x 150 ft with use of BUE at mod I level. Sit to stand with min A to RW. Standing tolerance x 10 min with one UE support on RW and CGA while playing Wii bowling game. Standing balance on Wii balance board, attempt penguin game but pt unable to fully weight shift onto LLE to play game correctly. Wii balance board soccer ball game with focus on weight shift to midline from lateral shift to the R following visual cues, mod manual assist to weight shift onto LLE with assist for L knee control. Ambulation through obstacle course navigating cones with RW and min A to simulate pt's home environment where she has lots of tight turns. Sidesteps L/R with RW 2 x 10 ft with  min A for hip abd strengthening. Squat pivot transfer back to bed CGA. Pt left seated EOB in care of RN and NT for vitals and meds.   Therapy Documentation Precautions:  Precautions Precautions: Fall, Back Precaution Comments: reviewed spinal precautions Required Braces or Orthoses: Spinal Brace Spinal Brace: Lumbar corset, Applied in sitting position Restrictions Weight Bearing Restrictions: No    Therapy/Group: Individual Therapy   Excell Seltzer, PT, DPT  05/24/2019, 12:51 PM

## 2019-05-24 NOTE — Progress Notes (Signed)
Occupational Therapy Session Note  Patient Details  Name: Rebecca Lucas MRN: [id number] Date of Birth: 05/10/1956  Today's Date: 05/24/2019 OT Individual Time: 8295-6213 OT Individual Time Calculation (min): 75 min    Short Term Goals: Week 2:  OT Short Term Goal 1 (Week 2): STG=LTG due to LOS  Skilled Therapeutic Interventions/Progress Updates:    Pt seen for OT session focusing on ADL re-training. Pt received sitting on BSC having just finished toileting task. Voiced pain 7/10 of overall body aches, RN made aware and administered pain meds at start of session.  Functional Transfers: Stand pivot with RW BSC>EOB with steadying assist. Squat pivot EOB>w/c with min A. Sit>stand at RW during dressing task with close supervision. Squat pivot transfer w/c<>EOM close supervision, able to set-up in prep for transfer with min questioning cues.   ADL re-training: Declined dressing EOB due to fatigue/soreness. Transitioned to w/c to dress. Used reacher to thread on underwear with increased time, standing at RW with steadying assist to pull up underwear. Provided step stool to assist with pants, assist for management to prop feet on foot stool and using reacher with increased time and min A to thread pants. Provided education and demonstration for use of dressing stick as alternative AE to assist with LB dressing, will benefit from move practice. Total A to don TED hose and L AFO/shoe. Grooming tasks w/c level at sink mod I. Required increased time and rest breaks to complete seated level dressing routine today.   Therapeutic Activity: Supine on therapy mat. UE ROM, shoulder ABduction and shoulder flexion for full body stretch, x2 sets of 10 each exercise. UE strengthening using #2 dowel rod, chest press, overhead press and bicep curl, x1 set of 10 with multi-modal cuing for proper form and technique and ensuring maintaining of spinal pre-cautions during exercises.  Pt returned to room at end of  session, transitioned to recliner and left seated with all needs in reach.   Therapy Documentation Precautions:  Precautions Precautions: Fall, Back Precaution Comments: reviewed spinal precautions Required Braces or Orthoses: Spinal Brace Spinal Brace: Lumbar corset, Applied in sitting position Restrictions Weight Bearing Restrictions: No   Therapy/Group: Individual Therapy  [name] L 05/24/2019, 6:52 AM

## 2019-05-24 NOTE — Progress Notes (Signed)
[place] PHYSICAL MEDICINE & REHABILITATION PROGRESS NOTE   Subjective/Complaints: Pt reports ate her entire breakfast- bowels still working well. Not sleeping well- mainly due to aches and pains of therapy but was wondering about sleeping medicine prn.  Discussed trazodone- she's amenable.  ROS: Denies CP, shortness of breath, nausea, vomiting, diarrhea.  Objective:   No results found. Recent Labs    05/23/19 1626 05/24/19 0435  WBC 21.8* 18.9*  HGB 9.5* 9.6*  HCT 30.3* 31.1*  PLT 228 214   Recent Labs    05/23/19 1626  NA 139  K 4.5  CL 107  CO2 26  GLUCOSE 151*  BUN 25*  CREATININE 0.63  CALCIUM 8.4*    Intake/Output Summary (Last 24 hours) at 05/24/2019 1407 Last data filed at 05/24/2019 0730 Gross per 24 hour  Intake 360 ml  Output -  Net 360 ml     Physical Exam: Vital Signs Blood pressure 124/70, pulse 89, temperature 98.6 F (37 C), temperature source Oral, resp. rate 16, height 5\' 7"  (1.702 m), weight 60 kg, SpO2 99 %. Constitutional: No distress . Vital signs reviewed. In room in bed; sitting up; wearing headscarf, stable round facies from steroids, NAD HENT: Normocephalic.  Atraumatic. Eyes: EOMI. No discharge. Cardiovascular: RRR Respiratory: Normal effort.  No stridor. CTA b/l GI: Non-distended. NT, soft, (+)BS Skin: Warm and dry.  Intact. Psych: Normal mood.  Normal behavior. Musc: No edema or tenderness in extremities. Neuro: Alert Motor: Bilateral upper extremities: 5/5 proximal distal Right lower extremity: 4+/5 proximal distal Left lower extremity: Ankle plantarflexion 3+/5, otherwise 0/5 Sensation diminished to light touch left lower extremity  Skin:Left distal tibia with lesion healing; sutures in place- mild/minimal serosanguinous drainage on dressing- not evident currently from incision- been 13 days Assessment/Plan: 1. 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  Physiatrist is providing close team supervision and 24 hour management of active medical problems listed below.  Physiatrist and rehab team continue to assess barriers to discharge/monitor patient progress toward functional and medical goals  Care Tool:  Bathing  Bathing activity did not occur: Refused Body parts bathed by patient: Right arm, Left arm, Chest, Abdomen, Front perineal area, Right upper leg, Left upper leg, Face, Buttocks, Right lower leg, Left lower leg   Body parts bathed by helper: Buttocks, Right lower leg, Left lower leg     Bathing assist Assist Level: Set up assist     Upper Body Dressing/Undressing Upper body dressing   What is the patient wearing?: Pull over shirt, Orthosis Orthosis activity level: Performed by patient  Upper body assist Assist Level: Set up assist    Lower Body Dressing/Undressing Lower body dressing      What is the patient wearing?: Underwear/pull up, Pants     Lower body assist Assist for lower body dressing: Minimal Assistance - Patient > 75%     Toileting Toileting Toileting Activity did not occur Landscape architect and hygiene only): Refused  Toileting assist Assist for toileting: Contact Guard/Touching assist     Transfers Chair/bed transfer  Transfers assist     Chair/bed transfer assist level: Contact Guard/Touching assist     Locomotion Ambulation   Ambulation assist      Assist level: Contact Guard/Touching assist Assistive device: Walker-rolling Max distance: 45'   Walk 10 feet activity  Assist     Assist level: Contact Guard/Touching assist Assistive device: Walker-rolling   Walk 50 feet activity   Assist Walk 50 feet with 2 turns activity did not occur: Safety/medical concerns         Walk 150 feet activity   Assist Walk 150 feet  activity did not occur: Safety/medical concerns         Walk 10 feet on uneven surface  activity   Assist Walk 10 feet on uneven surfaces activity did not occur: Safety/medical concerns         Wheelchair     Assist Will patient use wheelchair at discharge?: Yes Type of Wheelchair: Manual Wheelchair activity did not occur: Safety/medical concerns  Wheelchair assist level: Independent Max wheelchair distance: 150    Wheelchair 50 feet with 2 turns activity    Assist    Wheelchair 50 feet with 2 turns activity did not occur: Safety/medical concerns   Assist Level: Independent   Wheelchair 150 feet activity     Assist   CBG (last 3)  No results for input(s): GLUCAP in the last 72 hours.   Wheelchair 150 feet activity did not occur: Safety/medical concerns   Assist Level: Independent   Blood pressure 124/70, pulse 89, temperature 98.6 F (37 C), temperature source Oral, resp. rate 16, height 5\' 7"  (1.702 m), weight 60 kg, SpO2 99 %.  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  Continue CIR -Back brace when out of bed. -Plan is to follow-up outpatient Dr. Earlie Server in regards to chemotherapy  9/6- Chemo to start 05/30/2019, not 9/24, like originally planned  9/9- will see when sutures can come out- tomorrow is 14 days, however, on steroids.  9/10- will remove Sutures on Monday 9/14 2. Antithrombotics: -DVT/anticoagulation:SCDs.LE venous dopplers negative for DVT Will consider starting Lovenox  9/9- will discuss with pt tomorrow-  plts up to 177k  9/10- pt agreed, will start Lovenox -antiplatelet therapy: N/A 3. Pain Management:Neurontin 300 mg 3 times daily, Duragesic patch 75 mcg, Advil 400 mg 3 times daily oxycodone as needed  Ibuprofen changed to PRN on 9/5  Relatively controlled with medication on 9/7  4. Mood:motivational support -antipsychotic agents: N/A 5. Neuropsych: This patientiscapable of making decisions on herown behalf. 6. Skin/Wound Care:Routine skin checks -honeycomb dressing in place currently, plan to DC sutures soon  Continue foam dressing to left lower extremity  9/8- will d/c honeycomb dressing  9/9- will see when sutures to be removed 7. Fluids/Electrolytes/Nutrition:Routine in and outs  BMP within acceptable range on 9/7 8. Acute blood loss anemia.   Hemoglobin 10.0 on 9/7   Continue to monitor 9. Leukocytosis secondary to steroids. Continue to monitor  WBCs 24.6 on 9/7  Afebrile  Continue to monitor  9/11- WBC down to 18.9 10. Urinary retention:  Appears to be improving 11. Constipation in setting of Neurogenic bowel. Linzess daily. Adjust bowel program as needed  Bowel program with dig stim at night/evening.   Encouraged pt to take  Linzess; can hold miralax if she wants.  Miralax prn  Appears to be improving 12.  Steroid-induced hyperglycemia  Continue to monitor  13. Dispo  9/8- will try to arrange to deactivate her central port x 1 day so can take a  shower.  9/10- will re-access today after shower; d/c date 9/16- and starts Chemo 9/17- father will be home with her at home.  9/11- will start trazodone 50 mg QHS prn for sleep  LOS: 10 days A FACE TO FACE EVALUATION WAS PERFORMED  Remingtyn Depaola 05/24/2019, 2:07 PM

## 2019-05-25 ENCOUNTER — Inpatient Hospital Stay (HOSPITAL_COMMUNITY): Payer: 59 | Admitting: Occupational Therapy

## 2019-05-25 LAB — URINALYSIS, ROUTINE W REFLEX MICROSCOPIC
Bilirubin Urine: NEGATIVE
Glucose, UA: NEGATIVE mg/dL
Hgb urine dipstick: NEGATIVE
Ketones, ur: NEGATIVE mg/dL
Nitrite: NEGATIVE
Protein, ur: NEGATIVE mg/dL
Specific Gravity, Urine: 1.02 (ref 1.005–1.030)
WBC, UA: >50 WBC/hpf — ABNORMAL HIGH (ref 0–5)
pH: 5 (ref 5.0–8.0)

## 2019-05-25 NOTE — Progress Notes (Signed)
[place] PHYSICAL MEDICINE & REHABILITATION PROGRESS NOTE   Subjective/Complaints: Complains of feeling more tired.  She thinks her urine is starting to smell bad.  She does not have any sensation for urination.  She does have to do some peri-urethral stimulation to initiate voiding, we reviewed the results of the latest CBC ROS: Denies CP, shortness of breath, nausea, vomiting, diarrhea.  Objective:   No results found. Recent Labs    05/23/19 1626 05/24/19 0435  WBC 21.8* 18.9*  HGB 9.5* 9.6*  HCT 30.3* 31.1*  PLT 228 214   Recent Labs    05/23/19 1626  NA 139  K 4.5  CL 107  CO2 26  GLUCOSE 151*  BUN 25*  CREATININE 0.63  CALCIUM 8.4*    Intake/Output Summary (Last 24 hours) at 05/25/2019 1328 Last data filed at 05/25/2019 0902 Gross per 24 hour  Intake 600 ml  Output -  Net 600 ml     Physical Exam: Vital Signs Blood pressure 134/75, pulse 100, temperature 98.5 F (36.9 C), temperature source Oral, resp. rate 18, height 5\' 7"  (1.702 m), weight 60 kg, SpO2 99 %. Constitutional: No distress . Vital signs reviewed. In room in bed; sitting up; wearing headscarf, stable round facies from steroids, NAD HENT: Normocephalic.  Atraumatic. Eyes: EOMI. No discharge. Cardiovascular: RRR Respiratory: Normal effort.  No stridor. CTA b/l GI: Non-distended. NT, soft, (+)BS Skin: Warm and dry.  Intact. Psych: Normal mood.  Normal behavior. Musc: No edema or tenderness in extremities. Neuro: Alert Motor: Bilateral upper extremities: 5/5 proximal distal Right lower extremity: 4+/5 proximal distal Left lower extremity: Ankle plantarflexion 3+/5, otherwise 0/5 Sensation diminished to light touch left lower extremity   Assessment/Plan: 1. 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  Physiatrist is providing close team supervision and 24 hour management of active medical problems listed below.  Physiatrist and rehab team continue to assess barriers to discharge/monitor patient progress toward functional and medical goals  Care Tool:  Bathing  Bathing activity did not occur: Refused Body parts bathed by patient: Right arm, Left arm, Chest, Abdomen, Front perineal area, Right upper leg, Left upper leg, Face, Buttocks, Right lower leg, Left lower leg   Body parts bathed by helper: Buttocks, Right lower leg, Left lower leg     Bathing assist Assist Level: Set up assist     Upper Body Dressing/Undressing Upper body dressing   What is the patient wearing?: Pull over shirt, Orthosis Orthosis activity level: Performed by patient  Upper body assist Assist Level: Set up assist    Lower Body Dressing/Undressing Lower body dressing      What is the patient wearing?: Underwear/pull up, Pants     Lower body assist Assist for lower body dressing: Minimal Assistance - Patient > 75%     Toileting Toileting Toileting Activity did not occur Landscape architect and hygiene only): Refused  Toileting assist Assist for toileting: Contact Guard/Touching assist     Transfers Chair/bed transfer  Transfers assist     Chair/bed transfer assist level: Contact Guard/Touching assist     Locomotion Ambulation   Ambulation assist      Assist level: Contact Guard/Touching assist Assistive device: Walker-rolling Max distance: 45'   Walk 10 feet activity   Assist     Assist level: Contact Guard/Touching assist Assistive  device: Walker-rolling   Walk 50 feet activity   Assist Walk 50 feet with 2 turns activity did not occur: Safety/medical concerns         Walk 150 feet activity   Assist Walk 150 feet activity did not occur: Safety/medical concerns         Walk 10 feet on uneven surface  activity   Assist Walk 10  feet on uneven surfaces activity did not occur: Safety/medical concerns         Wheelchair     Assist Will patient use wheelchair at discharge?: Yes Type of Wheelchair: Manual Wheelchair activity did not occur: Safety/medical concerns  Wheelchair assist level: Independent Max wheelchair distance: 150    Wheelchair 50 feet with 2 turns activity    Assist    Wheelchair 50 feet with 2 turns activity did not occur: Safety/medical concerns   Assist Level: Independent   Wheelchair 150 feet activity     Assist   CBG (last 3)  No results for input(s): GLUCAP in the last 72 hours.   Wheelchair 150 feet activity did not occur: Safety/medical concerns   Assist Level: Independent   Blood pressure 134/75, pulse 100, temperature 98.5 F (36.9 C), temperature source Oral, resp. rate 18, height 5\' 7"  (1.702 m), weight 60 kg, SpO2 99 %.  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  Continue CIR -Back brace when out of bed. -Plan is to follow-up outpatient Dr. Earlie Server in regards to chemotherapy  9/6- Chemo to start 05/30/2019, not 9/24, like originally planned  9/9- will see when sutures can come out- tomorrow is 14 days, however, on steroids.  9/10- will remove Sutures on Monday 9/14 2. Antithrombotics: -DVT/anticoagulation:SCDs.LE venous dopplers negative for DVT   9/10- pt agreed to  Lovenox -antiplatelet therapy: N/A 3. Pain Management:Neurontin 300 mg 3 times daily, Duragesic patch 75 mcg, Advil 400 mg 3 times daily oxycodone as  needed  Ibuprofen changed to PRN on 9/5  Relatively controlled with medication on 9/7  4. Mood:motivational support -antipsychotic agents: N/A 5. Neuropsych: This patientiscapable of making decisions on herown behalf. 6. Skin/Wound Care:Routine skin checks -honeycomb dressing in place currently, plan to DC sutures soon  Continue foam dressing to left lower extremity  9/8- will d/c honeycomb dressing  9/9- will see when sutures to be removed 7. Fluids/Electrolytes/Nutrition:Routine in and outs  BMP within acceptable range on 9/7 8. Acute blood loss anemia.   Hemoglobin 10.0 on 9/7   Continue to monitor 9. Leukocytosis secondary to steroids. Continue to monitor  WBCs 24.6 on 9/7  Afebrile  Continue to monitor  9/11- WBC down to 18.9 10. Urinary retention:  Has fatigue and foul-smelling urine in the setting of elevated WBCs, will check UA CNS cath specimen 11. Constipation in setting of Neurogenic bowel and opioid-induced constipation. Linzess daily. Adjust bowel program as needed  Bowel program with dig stim at night/evening.   Encouraged pt to take  Linzess; can hold miralax if she wants.  Miralax prn  Appears to be improving 12.  Steroid-induced hyperglycemia  Continue to monitor  13. Dispo  9/8- will try to arrange to deactivate her central port x 1 day so can take a shower.  9/10- will re-access today after shower; d/c  date 9/16- and starts Chemo 9/17- father will be home with her at home.  9/11- will start trazodone 50 mg QHS prn for sleep  LOS: 11 days A FACE TO FACE EVALUATION WAS PERFORMED  Rebecca Lucas 05/25/2019, 1:28 PM

## 2019-05-25 NOTE — Progress Notes (Signed)
Occupational Therapy Session Note  Patient Details  Name: Rebecca Lucas MRN: [id number] Date of Birth: 1956/07/25  Today's Date: 05/25/2019 OT Individual Time: 1000-1045 OT Individual Time Calculation (min): 45 min    Short Term Goals: Week 2:  OT Short Term Goal 1 (Week 2): STG=LTG due to LOS  Skilled Therapeutic Interventions/Progress Updates:    Patient in bed, alert, aware of needs.  She states that she feels worn out today and is having more pain than in recent days.  Supine to/from SSP with min A, sit pivot transfers to/from bed and w/c with min A.  Sit to stand with walker min A.  Sponge bath at sink w/c level with set up, UB dressing with set up, don/doff of orthosis seated edge of bed and in w/c with set up/min A.  LB dressing with min A.  Teds/socks max A.  Patient returned to bed due to fatigue, missed 15 minutes of therapy.  Bed alarm set and call bell in reach.  Therapy Documentation Precautions:  Precautions Precautions: Fall, Back Precaution Comments: reviewed spinal precautions Required Braces or Orthoses: Spinal Brace Spinal Brace: Lumbar corset, Applied in sitting position Restrictions Weight Bearing Restrictions: No General: General OT Amount of Missed Time: 15 Minutes Vital Signs:   Pain: Pain Assessment Pain Scale: 0-10 Pain Score: 6  Pain Type: Surgical pain Pain Location: Back Pain Orientation: Mid Pain Descriptors / Indicators: Aching Pain Frequency: Intermittent Pain Onset: With Activity Patients Stated Pain Goal: 0 Pain Intervention(s): RN made aware;Repositioned Multiple Pain Sites: No   Other Treatments:     Therapy/Group: Individual Therapy  Carlos Levering 05/25/2019, 10:53 AM

## 2019-05-25 NOTE — Progress Notes (Signed)
Changed patient's  4x4 dressing to her back. The bottom part of her dressing was saturated and a small portion on the top part. Will monitor.

## 2019-05-26 ENCOUNTER — Inpatient Hospital Stay (HOSPITAL_COMMUNITY): Payer: 59 | Admitting: Physical Therapy

## 2019-05-26 MED ORDER — CEPHALEXIN 250 MG PO CAPS
250.0000 mg | ORAL_CAPSULE | Freq: Three times a day (TID) | ORAL | Status: DC
  Administered 2019-05-26 – 2019-05-30 (×12): 250 mg via ORAL
  Filled 2019-05-26 (×12): qty 1

## 2019-05-26 NOTE — Progress Notes (Signed)
Physical Therapy Session Note  Patient Details  Name: Rebecca Lucas MRN: [id number] Date of Birth: [birth year]  Today's Date: 05/26/2019 PT Individual Time: 5750-5183 PT Individual Time Calculation (min): 54 min   Short Term Goals: Week 1:  PT Short Term Goal 1 (Week 1): Pt will be able to complete transfers with min A consistently PT Short Term Goal 1 - Progress (Week 1): Met PT Short Term Goal 2 (Week 1): Pt will ambulate x 50 ft with LRAD and mod A PT Short Term Goal 2 - Progress (Week 1): Met PT Short Term Goal 3 (Week 1): Pt will initiate w/c mobility PT Short Term Goal 3 - Progress (Week 1): Met  Skilled Therapeutic Interventions/Progress Updates:  Pt was seen bedside in the pm. Pt sitting up in bedside recliner. Pt initially reluctant to participate secondary to not feeling well. Pt agreed to try. Performed AAROM-AROM R LE, AAROM-PROM L LE, 2sets x 10 reps each, hip flex and LAQs as well as ankle pumps R LE only. Performed static stretching L ankle. Pt performed sit to stand transfer with rolling walker and min A. Pt propelled to and from rehab gym [place], for UE strengthening. In gym treatment focused on blocked practice sit to stand transfers 3 sets x 3 reps each with min A and verbal cues. Returned to room following treatment, performed commode transfer with min to mod A x 1 squat pivot transfer. Pt left sitting on commode with call bell within reach and nurse tech notified.   Therapy Documentation Precautions:  Precautions Precautions: Fall, Back Precaution Comments: reviewed spinal precautions Required Braces or Orthoses: Spinal Brace Spinal Brace: Lumbar corset, Applied in sitting position Restrictions Weight Bearing Restrictions: No General:   Pain: No c/o pain.   Therapy/Group: Individual Therapy  Dub Amis 05/26/2019, 1:59 PM

## 2019-05-26 NOTE — Progress Notes (Signed)
[place] PHYSICAL MEDICINE & REHABILITATION PROGRESS NOTE   Subjective/Complaints: Poor appetite and malaise.  Does not feel like doing physical therapy ROS: Denies CP, shortness of breath, nausea, vomiting, diarrhea.  Objective:   No results found. Recent Labs    05/23/19 1626 05/24/19 0435  WBC 21.8* 18.9*  HGB 9.5* 9.6*  HCT 30.3* 31.1*  PLT 228 214   Recent Labs    05/23/19 1626  NA 139  K 4.5  CL 107  CO2 26  GLUCOSE 151*  BUN 25*  CREATININE 0.63  CALCIUM 8.4*    Intake/Output Summary (Last 24 hours) at 05/26/2019 1246 Last data filed at 05/26/2019 0816 Gross per 24 hour  Intake 600 ml  Output -  Net 600 ml     Physical Exam: Vital Signs Blood pressure (!) 144/89, pulse 93, temperature 98.5 F (36.9 C), temperature source Oral, resp. rate 16, height 5\' 7"  (1.702 m), weight 60.4 kg, SpO2 100 %. Constitutional: No distress . Vital signs reviewed. In room in bed; sitting up; wearing headscarf, stable round facies from steroids, NAD HENT: Normocephalic.  Atraumatic. Eyes: EOMI. No discharge. Cardiovascular: RRR Respiratory: Normal effort.  No stridor. CTA b/l, no wheezing no rales or rhonchi GI: Distended, firm with normal bowel sounds Skin: Warm and dry.  Intact. Psych: Normal mood.  Normal behavior. Musc: No edema or tenderness in extremities. Neuro: Alert Motor: Bilateral upper extremities: 5/5 proximal distal Right lower extremity: 4+/5 proximal distal Left lower extremity: Ankle plantarflexion 3+/5, otherwise 0/5 Sensation diminished to light touch left lower extremity   Assessment/Plan: 1. 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  Physiatrist is providing close team supervision and 24 hour management of active medical problems listed  below.  Physiatrist and rehab team continue to assess barriers to discharge/monitor patient progress toward functional and medical goals  Care Tool:  Bathing  Bathing activity did not occur: Refused Body parts bathed by patient: Right arm, Left arm, Chest, Abdomen, Front perineal area, Right upper leg, Left upper leg, Face, Buttocks, Right lower leg, Left lower leg   Body parts bathed by helper: Buttocks, Right lower leg, Left lower leg     Bathing assist Assist Level: Set up assist     Upper Body Dressing/Undressing Upper body dressing   What is the patient wearing?: Pull over shirt, Orthosis Orthosis activity level: Performed by patient  Upper body assist Assist Level: Set up assist    Lower Body Dressing/Undressing Lower body dressing      What is the patient wearing?: Underwear/pull up, Pants     Lower body assist Assist for lower body dressing: Minimal Assistance - Patient > 75%     Toileting Toileting Toileting Activity did not occur Landscape architect and hygiene only): Refused  Toileting assist Assist for toileting: Contact Guard/Touching assist     Transfers Chair/bed transfer  Transfers assist     Chair/bed transfer assist level: Contact Guard/Touching assist     Locomotion Ambulation   Ambulation assist      Assist level: Contact Guard/Touching assist Assistive device: Walker-rolling Max distance: 45'   Walk 10 feet activity   Assist     Assist level: Contact Guard/Touching assist Assistive device: Walker-rolling   Walk 50 feet activity   Assist Walk 50 feet with 2 turns activity did not occur: Safety/medical concerns  Walk 150 feet activity   Assist Walk 150 feet activity did not occur: Safety/medical concerns         Walk 10 feet on uneven surface  activity   Assist Walk 10 feet on uneven surfaces activity did not occur: Safety/medical concerns         Wheelchair     Assist Will patient use  wheelchair at discharge?: Yes Type of Wheelchair: Manual Wheelchair activity did not occur: Safety/medical concerns  Wheelchair assist level: Independent Max wheelchair distance: 150    Wheelchair 50 feet with 2 turns activity    Assist    Wheelchair 50 feet with 2 turns activity did not occur: Safety/medical concerns   Assist Level: Independent   Wheelchair 150 feet activity     Assist   CBG (last 3)  No results for input(s): GLUCAP in the last 72 hours.   Wheelchair 150 feet activity did not occur: Safety/medical concerns   Assist Level: Independent   Blood pressure (!) 144/89, pulse 93, temperature 98.5 F (36.9 C), temperature source Oral, resp. rate 16, height 5\' 7"  (1.702 m), weight 60.4 kg, SpO2 100 %.  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  Continue CIR -Back brace when out of bed. -Plan is to follow-up outpatient Dr. Earlie Server in regards to chemotherapy  9/6- Chemo to start 05/30/2019, not 9/24, like originally planned  9/9- will see when sutures can come out- tomorrow is 14 days, however, on steroids.  9/10- will remove Sutures on Monday 9/14 2. Antithrombotics: -DVT/anticoagulation:SCDs.LE venous dopplers negative for DVT   9/10- pt agreed to  Lovenox -antiplatelet therapy: N/A 3. Pain Management:Neurontin 300 mg 3 times daily, Duragesic patch 75 mcg, Advil 400 mg 3 times daily oxycodone as needed  Ibuprofen changed to PRN on 9/5  Relatively controlled with medication on 9/7  4. Mood:motivational  support -antipsychotic agents: N/A 5. Neuropsych: This patientiscapable of making decisions on herown behalf. 6. Skin/Wound Care:Routine skin checks -honeycomb dressing in place currently, plan to DC sutures soon  Continue foam dressing to left lower extremity  9/8- will d/c honeycomb dressing  9/9- will see when sutures to be removed 7. Fluids/Electrolytes/Nutrition:Routine in and outs  BMP within acceptable range on 9/7 8. Acute blood loss anemia.   Hemoglobin 10.0 on 9/7   Continue to monitor 9. Leukocytosis secondary to steroids. Continue to monitor  WBCs 24.6 on 9/7  Afebrile  Continue to monitor  9/11- WBC down to 18.9 10. Urinary retention:  Has fatigue and foul-smelling urine in the setting of elevated WBCs, UA positive await culture, start Keflex 250 mg 3 times daily until cultures are back 11. Constipation in setting of Neurogenic bowel and opioid-induced constipation. Linzess daily. Adjust bowel program as needed  Bowel program with dig stim at night/evening.   Encouraged pt to take  Linzess; can hold miralax if she wants.  Miralax prn  Appears to be improving 12.  Steroid-induced hyperglycemia  Continue to monitor  13. Dispo  9/8- will try to arrange to deactivate her central port x 1 day so can take a shower.  9/10- will re-access today after shower; d/c date 9/16- and starts Chemo 9/17- father will be home with her at home.  9/11- will start trazodone 50 mg  QHS prn for sleep  LOS: 12 days A FACE TO FACE EVALUATION WAS PERFORMED  Charlett Blake 05/26/2019, 12:46 PM

## 2019-05-27 ENCOUNTER — Inpatient Hospital Stay (HOSPITAL_COMMUNITY): Payer: 59

## 2019-05-27 ENCOUNTER — Inpatient Hospital Stay (HOSPITAL_COMMUNITY): Payer: 59 | Admitting: Physical Therapy

## 2019-05-27 ENCOUNTER — Inpatient Hospital Stay (HOSPITAL_COMMUNITY): Payer: 59 | Admitting: Occupational Therapy

## 2019-05-27 LAB — URINE CULTURE: Culture: >=100000 — AB

## 2019-05-27 LAB — COMPREHENSIVE METABOLIC PANEL
ALT: 89 U/L — ABNORMAL HIGH (ref 0–44)
AST: 39 U/L (ref 15–41)
Albumin: 2.5 g/dL — ABNORMAL LOW (ref 3.5–5.0)
Alkaline Phosphatase: 118 U/L (ref 38–126)
Anion gap: 11 (ref 5–15)
BUN: 18 mg/dL (ref 8–23)
CO2: 23 mmol/L (ref 22–32)
Calcium: 8.5 mg/dL — ABNORMAL LOW (ref 8.9–10.3)
Chloride: 101 mmol/L (ref 98–111)
Creatinine, Ser: 0.57 mg/dL (ref 0.44–1.00)
GFR calc Af Amer: >60 mL/min (ref >60)
GFR calc non Af Amer: >60 mL/min (ref >60)
Glucose, Bld: 93 mg/dL (ref 70–99)
Potassium: 4.2 mmol/L (ref 3.5–5.1)
Sodium: 135 mmol/L (ref 135–145)
Total Bilirubin: 0.5 mg/dL (ref 0.3–1.2)
Total Protein: 5.7 g/dL — ABNORMAL LOW (ref 6.5–8.1)

## 2019-05-27 LAB — CBC WITH DIFFERENTIAL/PLATELET
Abs Immature Granulocytes: 0.37 10*3/uL — ABNORMAL HIGH (ref 0.00–0.07)
Basophils Absolute: 0 10*3/uL (ref 0.0–0.1)
Basophils Relative: 0 %
Eosinophils Absolute: 0 10*3/uL (ref 0.0–0.5)
Eosinophils Relative: 0 %
HCT: 33.2 % — ABNORMAL LOW (ref 36.0–46.0)
Hemoglobin: 10.7 g/dL — ABNORMAL LOW (ref 12.0–15.0)
Immature Granulocytes: 2 %
Lymphocytes Relative: 1 %
Lymphs Abs: 0.2 10*3/uL — ABNORMAL LOW (ref 0.7–4.0)
MCH: 31.8 pg (ref 26.0–34.0)
MCHC: 32.2 g/dL (ref 30.0–36.0)
MCV: 98.5 fL (ref 80.0–100.0)
Monocytes Absolute: 0.7 10*3/uL (ref 0.1–1.0)
Monocytes Relative: 4 %
Neutro Abs: 16.5 10*3/uL — ABNORMAL HIGH (ref 1.7–7.7)
Neutrophils Relative %: 93 %
Platelets: 203 10*3/uL (ref 150–400)
RBC: 3.37 MIL/uL — ABNORMAL LOW (ref 3.87–5.11)
RDW: 17.2 % — ABNORMAL HIGH (ref 11.5–15.5)
WBC: 17.8 10*3/uL — ABNORMAL HIGH (ref 4.0–10.5)
nRBC: 0 % (ref 0.0–0.2)

## 2019-05-27 MED ORDER — DEXAMETHASONE 2 MG PO TABS
3.0000 mg | ORAL_TABLET | Freq: Every day | ORAL | Status: DC
  Administered 2019-05-28 – 2019-06-03 (×7): 3 mg via ORAL
  Filled 2019-05-27 (×9): qty 2

## 2019-05-27 NOTE — Progress Notes (Signed)
Physical Therapy Note  Patient Details  Name: TIPPI MCCRAE MRN: [id number] Date of Birth: [birth year] Today's Date: 05/27/2019    Attempted to see patient at 10:20, but off unit at radiology for scan. Will follow up later as able to make up session from this morning.   Canary Brim Ivory Broad, PT, DPT, CBIS  05/27/2019, 10:20 AM

## 2019-05-27 NOTE — Progress Notes (Signed)
Last night, patient had a low grade temp. On call provider was called & was given verbal order to give her the tylenol dose that she refused on the earlier shift. Tylenol is scheduled but patient requested her ibuprofen instead. Next dose was in approximately 3-4 hours. She also c/o pain to her abdomen that she stated has been there for a few days. She is on keflex for UTI & started it yesterday. Communication put in for the provider for evaluation. No signs of distress noted. She seems to be a little more alert than the beginning of the shift when she seemed to have no energy & she stated that she did not feel well. This morning, she still has the abdominal discomfort, but is sitting up eating breakfast. Will pass on to on coming nurse.

## 2019-05-27 NOTE — Progress Notes (Signed)
Physical Therapy Session Note  Patient Details  Name: Rebecca Lucas MRN: [id number] Date of Birth: Jan 25, 1956  Today's Date: 05/27/2019 PT Individual Time: 1120-1150 PT Individual Time Calculation (min): 30 min   Short Term Goals: Week 2:  PT Short Term Goal 1 (Week 2): =LTG due to ELOS  Skilled Therapeutic Interventions/Progress Updates:    Pt reporting she is still having significant abdominal pain and unable to tolerate OOB at this time. Pt request to work on some bed level stretching and exercises for her legs and modified treatment to tolerance. Pt performed rolling in bed with CGA to min assist. AAROM and stretching to LLE at ankle, hip, and knee x 10 reps each direction.  Minimal muscle activation noted except at hip. RLE heel slides with resisted manual extension. Knee to chest stretch to R x 30 sec hold. In hooklying, performed isometric hip abduction/adduction squeezes with pillow, requires physical assist to LLE to maintain position. Rolled to L sidelying with pillow between lower extremities for comfort and improved spine positioning with call bell in reach.   Therapy Documentation Precautions:  Precautions Precautions: Fall, Back Precaution Comments: reviewed spinal precautions Required Braces or Orthoses: Spinal Brace Spinal Brace: Lumbar corset, Applied in sitting position Restrictions Weight Bearing Restrictions: No General: PT Amount of Missed Time (min): 30 Minutes PT Missed Treatment Reason: Patient unwilling to participate;Pain Pain: C/o abdominal pain - unrated. Performed therapies to tolerance and goal directed by patient.    Therapy/Group: Individual Therapy  Canary Brim Ivory Broad, PT, DPT, CBIS  05/27/2019, 1:00 PM

## 2019-05-27 NOTE — Progress Notes (Signed)
Occupational Therapy Session Note  Patient Details  Name: Rebecca Lucas MRN: [id number] Date of Birth: 1956-05-13  Today's Date: 05/27/2019 OT Individual Time: 3729-0211 OT Individual Time Calculation (min): 55 min    Short Term Goals: Week 2:  OT Short Term Goal 1 (Week 2): STG=LTG due to LOS  Skilled Therapeutic Interventions/Progress Updates:    Pt seen for OT session focusing on functional mobility and ADL re-training. Pt awake in supine upon arrival, cont with complaints of generalized weakness and stomach pains, declined need for intervention and willing to participate as able. Increased time and rest breaks required for all w/c level tasks this AM. She transferred to sitting EOB with min A for management of L LE using hospital bed functions. Min-mod squat pivot transfer to w/c. She completed bathing/dressing routine from w/c level at sink. Required assist to doff shirt, UB bathing/dressing completed with set-up/supervision. Pt requesting total A for LB bathing/dressing 2/2 fatigue and increased stabbing pain in stomach with mobility. Completed x3sit>stand for clothing management and pericare/buttock hygiene throughout routine, requiring mod A to stand and blocking of R knee to prvent buckling, unable to achieve erect posture in standing.  She requested to return to supine to rest and complete LB there-ex. Min A squat pivot to return to EOB and mod A to return to supine.  Completed LE there-ex, mod A for "clam shell" in L side-lying, LE extension, Glute squeeze and R hip AB/ADuction. Mult-modal cuing for proper form and technique and to facilitate full ROM. Completed x1 set of 10 each exericse. Pt left in supine to rest at end of session, all needs in reach and bed alarm on.    Therapy Documentation Precautions:  Precautions Precautions: Fall, Back Precaution Comments: reviewed spinal precautions Required Braces or Orthoses: Spinal Brace Spinal Brace: Lumbar corset, Applied in  sitting position Restrictions Weight Bearing Restrictions: No   Therapy/Group: Individual Therapy  Anapaola Kinsel L 05/27/2019, 7:42 AM

## 2019-05-27 NOTE — Progress Notes (Signed)
[place] PHYSICAL MEDICINE & REHABILITATION PROGRESS NOTE   Subjective/Complaints:  Pt c/o malaise, poor appetite and a sharp shooting pain epigastric and radiates over the rest of her abdomen- was dx'd with UTI over weekend, and put on Keflex but didn't change her overall Sx's before or after Keflex started.  Denies any associated nausea. KUB looks fine, so will check CT of abd/pelvis to determine what might be causing pain.    ROS: Denies CP, shortness of breath, nausea, vomiting, diarrhea.  Objective:   Dg Abd 1 View  Result Date: 05/27/2019 CLINICAL DATA:  Abdominal pain EXAM: ABDOMEN - 1 VIEW COMPARISON:  CT 05/08/2019 FINDINGS: The bowel gas pattern is normal. No radio-opaque calculi or other significant radiographic abnormality are seen. Lumbar spine. Posterior fusion changes in the IMPRESSION: No acute findings. Electronically Signed   By: Rolm Baptise M.D.   On: 05/27/2019 10:44   Recent Labs    05/27/19 0420  WBC 17.8*  HGB 10.7*  HCT 33.2*  PLT 203   Recent Labs    05/27/19 0420  NA 135  K 4.2  CL 101  CO2 23  GLUCOSE 93  BUN 18  CREATININE 0.57  CALCIUM 8.5*    Intake/Output Summary (Last 24 hours) at 05/27/2019 1129 Last data filed at 05/27/2019 0907 Gross per 24 hour  Intake 600 ml  Output -  Net 600 ml     Physical Exam: Vital Signs Blood pressure (!) 148/95, pulse 96, temperature 98.1 F (36.7 C), temperature source Oral, resp. rate 18, height 5\' 7"  (1.702 m), weight 56 kg, SpO2 100 %. Constitutional: No distress . Vital signs reviewed. In room in bed; laying on side; color doesn't look as good; appears tired/in discomfort,  wearing headscarf, stable round facies from steroids, NAD HENT: Normocephalic.  Atraumatic. Eyes: EOMI. No discharge. Cardiovascular: RRR Respiratory: Normal effort.  No stridor. CTA b/l, no wheezing no rales or rhonchi GI: hypoactive BS; soft, NT, ND- can't cause the pain she's having Skin: Warm and dry.   Intact. Psych: Normal mood.  Normal behavior. Musc: No edema or tenderness in extremities. Neuro: Alert Motor: Bilateral upper extremities: 5/5 proximal distal Right lower extremity: 4+/5 proximal distal Left lower extremity: Ankle plantarflexion 3+/5, otherwise 0/5 Sensation diminished to light touch left lower extremity   Assessment/Plan: 1. 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  Physiatrist is providing close team supervision and 24 hour management of active medical problems listed below.  Physiatrist and rehab team continue to assess barriers to discharge/monitor patient progress toward functional and medical goals  Care Tool:  Bathing  Bathing activity did not occur: Refused Body parts bathed by patient: Right arm, Left arm, Chest, Abdomen, Front perineal area, Right upper leg, Left upper leg, Face, Buttocks, Right lower leg, Left lower leg   Body parts bathed by helper: Buttocks, Right lower leg, Left lower leg     Bathing assist Assist Level: Set up assist     Upper Body Dressing/Undressing Upper body dressing   What is the patient wearing?: Pull over shirt, Orthosis Orthosis activity level: Performed by patient  Upper body assist Assist Level: Set up assist    Lower Body Dressing/Undressing Lower body dressing      What is the patient wearing?: Underwear/pull up, Pants     Lower body assist Assist  for lower body dressing: Minimal Assistance - Patient > 75%     Toileting Toileting Toileting Activity did not occur Landscape architect and hygiene only): Refused  Toileting assist Assist for toileting: Contact Guard/Touching assist     Transfers Chair/bed transfer  Transfers assist     Chair/bed transfer assist level: Minimal Assistance -  Patient > 75%     Locomotion Ambulation   Ambulation assist      Assist level: Contact Guard/Touching assist Assistive device: Walker-rolling Max distance: 45'   Walk 10 feet activity   Assist     Assist level: Contact Guard/Touching assist Assistive device: Walker-rolling   Walk 50 feet activity   Assist Walk 50 feet with 2 turns activity did not occur: Safety/medical concerns         Walk 150 feet activity   Assist Walk 150 feet activity did not occur: Safety/medical concerns         Walk 10 feet on uneven surface  activity   Assist Walk 10 feet on uneven surfaces activity did not occur: Safety/medical concerns         Wheelchair     Assist Will patient use wheelchair at discharge?: Yes Type of Wheelchair: Manual Wheelchair activity did not occur: Safety/medical concerns  Wheelchair assist level: Independent Max wheelchair distance: 150    Wheelchair 50 feet with 2 turns activity    Assist    Wheelchair 50 feet with 2 turns activity did not occur: Safety/medical concerns   Assist Level: Independent   Wheelchair 150 feet activity     Assist   CBG (last 3)  No results for input(s): GLUCAP in the last 72 hours.   Wheelchair 150 feet activity did not occur: Safety/medical concerns   Assist Level: Independent   Blood pressure (!) 148/95, pulse 96, temperature 98.1 F (36.7 C), temperature source Oral, resp. rate 18, height 5\' 7"  (1.702 m), weight 56 kg, SpO2 100 %.  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  Continue  CIR -Back brace when out of bed. -Plan is to follow-up outpatient Dr. Earlie Server in regards to chemotherapy  9/6- Chemo to start 05/30/2019, not 9/24, like originally planned  9/9- will see when sutures can come out- tomorrow is 14 days, however, on steroids.  9/10- will remove Sutures on Monday 9/14 2. Antithrombotics: -DVT/anticoagulation:SCDs.LE venous dopplers negative for DVT   9/10- pt agreed to  Lovenox -antiplatelet therapy: N/A 3. Pain Management:Neurontin 300 mg 3 times daily, Duragesic patch 75 mcg, Advil 400 mg 3 times daily oxycodone as needed  Ibuprofen changed to PRN on 9/5  Relatively controlled with medication on 9/7  4. Mood:motivational support -antipsychotic agents: N/A 5. Neuropsych: This patientiscapable of making decisions on herown behalf. 6. Skin/Wound Care:Routine skin checks -honeycomb dressing in place currently, plan to DC sutures soon  Continue foam dressing to left lower extremity  9/8- will d/c honeycomb dressing  9/9- will see when sutures to be removed  9/14- will remove sutures today 7. Fluids/Electrolytes/Nutrition:Routine in and outs  BMP within acceptable range on 9/7 8. Acute blood loss anemia.   Hemoglobin 10.0 on 9/7   Continue to monitor 9. Leukocytosis secondary to steroids. Continue to monitor  WBCs 24.6 on 9/7  Afebrile  Continue to monitor  9/11- WBC down to 18.9 10. Urinary retention:  Has fatigue and foul-smelling urine in the setting of elevated WBCs, UA positive await culture, start Keflex 250 mg 3 times daily until cultures are back  9/14- pansensitive- will con't ABX 11. Constipation in setting of Neurogenic bowel and opioid-induced constipation. Linzess daily. Adjust bowel program as needed  Bowel program with dig stim at night/evening.   Encouraged pt to take  Linzess; can hold miralax if she wants.  Miralax prn  Appears to be improving  9/14-  pt asked for suppository to be d/c'd since doing so well- KUB (-) of note 12.  Steroid-induced hyperglycemia  Continue to monitor 13. Abd pain  9/14- will check CT of abd/pelvis to make sure nothing causing Abd pain- KUB (-)  14. Dispo  9/8- will try to arrange to deactivate her central port x 1 day so can take a shower.  9/10- will re-access today after shower; d/c date 9/16- and starts Chemo 9/17- father will be home with her at home.  9/11- will start trazodone 50 mg QHS prn for sleep  LOS: 13 days A FACE TO FACE EVALUATION WAS PERFORMED  Malekai Markwood 05/27/2019, 11:29 AM

## 2019-05-27 NOTE — Progress Notes (Signed)
Physical Therapy Note  Patient Details  Name: MALAYSIA CRANCE MRN: [id number] Date of Birth: [birth year] Today's Date: 05/27/2019    Pt request to attempt scheduled 9am PT session later due to not feeling well. Will check back later to see if patient able to participate at later time.   Canary Brim Ivory Broad, PT, DPT, CBIS  05/27/2019, 9:06 AM

## 2019-05-27 NOTE — Progress Notes (Addendum)
Patient had a pulse of 115, patient shows no s/s of distress. Patient complaints of only pain at this time, administered oxycodone. Recheck pulse of 110 manual to left radial. Maudry Mayhew, charge nurse notified. Continue plan of care, patient is asymptomatic at this time. No complications noted.  Audie Clear, LPN

## 2019-05-27 NOTE — Progress Notes (Signed)
Physical Therapy Session Note  Patient Details  Name: Rebecca Lucas MRN: [id number] Date of Birth: Jan 14, 1956  Today's Date: 05/27/2019 PT Missed Time: 71 Minutes Missed Time Reason: Pain   Pt is still reporting significant abdominal pain and is unable to tolerate physical therapy at this time. Missed 60 minutes of skilled physical therapy.   Tawana Scale, PT, DPT 05/27/2019, 12:55 PM

## 2019-05-28 ENCOUNTER — Inpatient Hospital Stay (HOSPITAL_COMMUNITY): Payer: 59

## 2019-05-28 ENCOUNTER — Inpatient Hospital Stay (HOSPITAL_COMMUNITY): Payer: 59 | Admitting: Occupational Therapy

## 2019-05-28 ENCOUNTER — Ambulatory Visit (HOSPITAL_COMMUNITY): Payer: 59 | Admitting: Physical Therapy

## 2019-05-28 LAB — SARS CORONAVIRUS 2 BY RT PCR (HOSPITAL ORDER, PERFORMED IN ~~LOC~~ HOSPITAL LAB): SARS Coronavirus 2: NEGATIVE

## 2019-05-28 MED ORDER — DEXTROSE-NACL 5-0.9 % IV SOLN
INTRAVENOUS | Status: DC
  Administered 2019-05-28: 16:00:00 via INTRAVENOUS

## 2019-05-28 MED ORDER — PANTOPRAZOLE SODIUM 40 MG IV SOLR
40.0000 mg | Freq: Two times a day (BID) | INTRAVENOUS | Status: DC
  Administered 2019-05-28 – 2019-05-29 (×3): 40 mg via INTRAVENOUS
  Filled 2019-05-28 (×2): qty 40

## 2019-05-28 MED ORDER — SODIUM CHLORIDE 0.9 % IV SOLN
INTRAVENOUS | Status: DC
  Administered 2019-05-29: 06:00:00 via INTRAVENOUS

## 2019-05-28 MED ORDER — SODIUM CHLORIDE 0.9 % IV SOLN
INTRAVENOUS | Status: DC | PRN
  Administered 2019-05-28: 12:00:00 via INTRAVENOUS

## 2019-05-28 MED ORDER — LIDOCAINE HCL 1 % IJ SOLN
INTRAMUSCULAR | Status: AC
  Filled 2019-05-28: qty 20

## 2019-05-28 MED ORDER — POLYETHYLENE GLYCOL 3350 17 G PO PACK
17.0000 g | PACK | Freq: Once | ORAL | Status: AC
  Administered 2019-05-28: 17 g via ORAL
  Filled 2019-05-28: qty 1

## 2019-05-28 MED ORDER — SUCRALFATE 1 G PO TABS
1.0000 g | ORAL_TABLET | Freq: Three times a day (TID) | ORAL | Status: DC
  Administered 2019-05-28 – 2019-06-07 (×41): 1 g via ORAL
  Filled 2019-05-28 (×43): qty 1

## 2019-05-28 NOTE — Progress Notes (Signed)
[facility] PHYSICAL MEDICINE & REHABILITATION PROGRESS NOTE   Subjective/Complaints:  Pt reports now very nauseated- tried to force herself to eat, but ate hardly anything for breakfast or yesterday.  Pt reported she still having sharp shooting abd pain epigastric and radiates out still.   Per CT has new ascites as well as gallbladder sludge. Called Dr Earlie Server, pt's oncologist to let him know what's going on; we agreed that GI consult best idea- called GI consult- they recommended U/S guided paracentesis by IR and if it showed nothing, an endoscopy in AM Added Protonix 40 mg IV BID, sucralfate 1g TID and I made her NPO at midnight for endoscopy.      ROS: Denies CP, shortness of breath, (+) nausea, abd pain (+)  Objective:   Ct Abdomen Pelvis Wo Contrast  Result Date: 05/27/2019 CLINICAL DATA:  Abdominal pain. Gastroenteritis or colitis suspected. History of metastatic lung cancer. EXAM: CT ABDOMEN AND PELVIS WITHOUT CONTRAST TECHNIQUE: Multidetector CT imaging of the abdomen and pelvis was performed following the standard protocol without IV contrast. COMPARISON:  02/19/2019. FINDINGS: Lower chest: Small bilateral pleural effusions. Hepatobiliary: No focal liver abnormality identified. There is mild pericholecystic fluid. There is sludge identified layering within the gallbladder. No stones noted. No bile duct dilatation. Pancreas: Unremarkable. No pancreatic ductal dilatation or surrounding inflammatory changes. Spleen: Normal in size without focal abnormality. Adrenals/Urinary Tract: Normal appearance of the adrenal glands. No kidney mass or hydronephrosis identified bilaterally. There is moderate distension of the urinary bladder without focal abnormality. Stomach/Bowel: The stomach is normal. Within the limitations of unenhanced technique there is no small bowel wall thickening, inflammation or distension. The appendix is visualized and appears normal. No colonic wall thickening,  inflammation or distension. Vascular/Lymphatic: Aortic atherosclerosis without aneurysm. Similar appearance prevertebral soft tissue thickening and retroperitoneal fat stranding associated with the L2 compression deformity. Asymmetric soft tissue thickening of the right crus of diaphragm which is contiguous with the soft tissue thickening around L2 is again noted. No retroperitoneal adenopathy. No pelvic or inguinal adenopathy. Reproductive: Uterus and bilateral adnexa are unremarkable. Other: New small to moderate volume of ascites identified within the abdomen and pelvis. Musculoskeletal: Status post posterior hardware fixation at T12 through L4. Laminectomy at L2 is again noted. Similar appearance of pathologic compression deformity involving the L2 vertebra. Similar appearance of metastasis to L1, L2 and L3 vertebra. Unchanged right sacral wing metastasis. IMPRESSION: 1. Interval development of small to moderate volume of ascites within the abdomen and pelvis. Etiology indeterminate. 2. L1 through L3 vertebral metastasis with pathologic fracture involving the L2 vertebra. Abnormal increased prevertebral soft tissue and retroperitoneal soft tissue stranding is again noted which appears similar to the CT of the lumbar spine from 05/08/2019. Associated asymmetric soft tissue thickening of the right crus of diaphragm is similar to 05/08/19 but new from CT AP from 02/19/2019. This area is incompletely characterized without IV contrast material. 3. Gallbladder sludge. Electronically Signed   By: Kerby Moors M.D.   On: 05/27/2019 14:20   Dg Abd 1 View  Result Date: 05/27/2019 CLINICAL DATA:  Abdominal pain EXAM: ABDOMEN - 1 VIEW COMPARISON:  CT 05/08/2019 FINDINGS: The bowel gas pattern is normal. No radio-opaque calculi or other significant radiographic abnormality are seen. Lumbar spine. Posterior fusion changes in the IMPRESSION: No acute findings. Electronically Signed   By: Rolm Baptise M.D.   On: 05/27/2019  10:44   Recent Labs    05/27/19 0420  WBC 17.8*  HGB 10.7*  HCT 33.2*  PLT 203   Recent Labs    05/27/19 0420  NA 135  K 4.2  CL 101  CO2 23  GLUCOSE 93  BUN 18  CREATININE 0.57  CALCIUM 8.5*    Intake/Output Summary (Last 24 hours) at 05/28/2019 1338 Last data filed at 05/28/2019 1334 Gross per 24 hour  Intake 340 ml  Output -  Net 340 ml     Physical Exam: Vital Signs Blood pressure (!) 146/83, pulse (!) 112, temperature 99.3 F (37.4 C), temperature source Oral, resp. rate 18, height 5\' 7"  (1.702 m), weight 56 kg, SpO2 98 %. Constitutional: No distress . Vital signs reviewed. In room in bed;laying on L side, doesn't want to move at all, poor/pale color,,  wearing headscarf, stable round facies from steroids, NAD HENT: Normocephalic.  Atraumatic. Eyes: EOMI. No discharge. Cardiovascular: RRR Respiratory: Normal effort.  No stridor. CTA b/l, no wheezing no rales or rhonchi GI: hypoactive BS; slightly distended; TTP epigastric; no rebound; not firm, but not soft Skin: Warm and dry.  Intact. Psych: Normal mood.  Normal behavior. Appears is pain/discomfort Musc: No edema or tenderness in extremities. Neuro: Alert Motor: Bilateral upper extremities: 5/5 proximal distal Right lower extremity: 4+/5 proximal distal Left lower extremity: Ankle plantarflexion 3+/5, otherwise 0/5 Sensation diminished to light touch left lower extremity   Assessment/Plan: 1. 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  Physiatrist is providing close team supervision and 24 hour management of active medical problems listed below.  Physiatrist and rehab team continue to assess barriers to discharge/monitor patient progress toward functional and medical goals  Care  Tool:  Bathing  Bathing activity did not occur: Refused Body parts bathed by patient: Right arm, Left arm, Chest, Abdomen, Front perineal area, Right upper leg, Left upper leg, Face, Buttocks, Right lower leg, Left lower leg   Body parts bathed by helper: Buttocks, Right lower leg, Left lower leg     Bathing assist Assist Level: Set up assist     Upper Body Dressing/Undressing Upper body dressing   What is the patient wearing?: Pull over shirt, Orthosis Orthosis activity level: Performed by patient  Upper body assist Assist Level: Set up assist    Lower Body Dressing/Undressing Lower body dressing      What is the patient wearing?: Underwear/pull up, Pants     Lower body assist Assist for lower body dressing: Minimal Assistance - Patient > 75%     Toileting Toileting Toileting Activity did not occur Landscape architect and hygiene only): Refused  Toileting assist Assist for toileting: Contact Guard/Touching assist     Transfers Chair/bed transfer  Transfers assist     Chair/bed transfer assist level: Minimal Assistance - Patient > 75%     Locomotion Ambulation   Ambulation assist      Assist level: Contact Guard/Touching assist Assistive device: Walker-rolling Max distance: 45'   Walk 10 feet activity   Assist     Assist level: Contact Guard/Touching assist Assistive device: Walker-rolling   Walk 50 feet activity   Assist Walk 50 feet with 2 turns activity did not occur: Safety/medical concerns         Walk 150 feet activity   Assist Walk 150 feet activity did not occur: Safety/medical concerns         Walk 10 feet on uneven surface  activity   Assist Walk 10 feet on uneven surfaces activity did not occur: Safety/medical concerns         Wheelchair     Assist Will patient use wheelchair at discharge?: Yes Type of Wheelchair: Manual Wheelchair activity did not occur: Safety/medical concerns  Wheelchair assist level:  Independent Max wheelchair distance: 150    Wheelchair 50 feet with 2 turns activity    Assist    Wheelchair 50 feet with 2 turns activity did not occur: Safety/medical concerns   Assist Level: Independent   Wheelchair 150 feet activity     Assist   CBG (last 3)  No results for input(s): GLUCAP in the last 72 hours.   Wheelchair 150 feet activity did not occur: Safety/medical concerns   Assist Level: Independent   Blood pressure (!) 146/83, pulse (!) 112, temperature 99.3 F (37.4 C), temperature source Oral, resp. rate 18, height 5\' 7"  (1.702 m), weight 56 kg, SpO2 98 %.  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  Continue CIR -Back brace when out of bed. -Plan is to follow-up outpatient Dr. Earlie Server in regards to chemotherapy  9/6- Chemo to start 05/30/2019, not 9/24, like originally planned  9/9- will see when sutures can come out- tomorrow is 14 days, however, on steroids.  9/10- will remove Sutures on Monday 9/14 2. Antithrombotics: -DVT/anticoagulation:SCDs.LE venous dopplers negative for DVT   9/10- pt agreed to  Lovenox -antiplatelet therapy: N/A 3. Pain Management:Neurontin 300 mg 3 times daily, Duragesic patch 75 mcg, Advil 400 mg 3 times daily oxycodone as needed  Ibuprofen changed to PRN on 9/5  Relatively controlled with medication on 9/7  4. Mood:motivational support -antipsychotic agents: N/A 5. Neuropsych: This patientiscapable of making decisions on herown behalf. 6. Skin/Wound Care:Routine skin  checks -honeycomb dressing in place currently, plan to DC sutures soon  Continue foam dressing to left lower extremity  9/8- will d/c honeycomb dressing  9/9- will see when sutures to be removed  9/14- will remove sutures today 7. Fluids/Electrolytes/Nutrition:Routine in and outs  BMP within acceptable range on 9/7 8. Acute blood loss anemia.   Hemoglobin 10.0 on 9/7   Continue to monitor 9. Leukocytosis secondary to steroids. Continue to monitor  WBCs 24.6 on 9/7  Afebrile  Continue to monitor  9/11- WBC down to 18.9 10. Urinary retention:  Has fatigue and foul-smelling urine in the setting of elevated WBCs, UA positive await culture, start Keflex 250 mg 3 times daily until cultures are back  9/14- pansensitive- will con't ABX 11. Constipation in setting of Neurogenic bowel and opioid-induced constipation. Linzess daily. Adjust bowel program as needed  Bowel program with dig stim at night/evening.   Encouraged pt to take  Linzess; can hold miralax if she wants.  Miralax prn  Appears to be improving  9/14- pt asked for suppository to be d/c'd since doing so well- KUB (-) of note 12.  Steroid-induced hyperglycemia  Continue to monitor 13. Abd pain  9/14- will check CT of abd/pelvis to make sure nothing causing Abd pain- KUB (-)  9/15- CT showed ascites and gallbladder sludge- GI consulted- ordered U/S guided paracentesis- GI ordered associated labs; will order AM labs as well; started protonix 40 mg IV BID and sucralfate 1  g QID and NPO for possible endoscopy in AM.    14. Dispo  9/8- will try to arrange to deactivate her central port x 1 day so can take a shower.  9/10- will re-access today after shower; d/c date 9/16- and starts Chemo 9/17- father will be home with her at home.  9/11- will start trazodone 50 mg QHS prn for sleep  9/15- will not d/c 9/16- have let Dr Earlie Server know what's going on.   LOS: 14 days A FACE TO FACE EVALUATION WAS  PERFORMED  Ethelda Deangelo 05/28/2019, 1:38 PM

## 2019-05-28 NOTE — Progress Notes (Signed)
Occupational Therapy Session Note  Patient Details  Name: NAVAEH KEHRES MRN: [id number] Date of Birth: [birth year]  Today's Date: 05/28/2019 OT Individual Time: 1055-1105 OT Individual Time Calculation (min): 10 min  and Today's Date: 05/28/2019 OT Missed Time: 65 Minutes Missed Time Reason: Pain   Short Term Goals: Week 2:  OT Short Term Goal 1 (Week 2): STG=LTG due to LOS  Skilled Therapeutic Interventions/Progress Updates:    Upon entering the room, pt in side lying position on the L side. Pt verbalized, " I'm not doing therapy because they are getting ready to take me down to draw off fluid." OT providing pt with education on her being NPO tonight with procedure scheduled for tomorrow. Information confirmed with RN and PA. OT encouraged pt to participate in therapeutic intervention but she declined secondary to feeling unwell. Call bell and all needed items within reach upon exiting the room.   Therapy Documentation Precautions:  Precautions Precautions: Fall, Back Precaution Comments: reviewed spinal precautions Required Braces or Orthoses: Spinal Brace Spinal Brace: Lumbar corset, Applied in sitting position Restrictions Weight Bearing Restrictions: No General: General OT Amount of Missed Time: 65 Minutes   Therapy/Group: Individual Therapy  [name] 05/28/2019, 11:18 AM

## 2019-05-28 NOTE — Progress Notes (Signed)
Physical Therapy Note  Patient Details  Name: Rebecca Lucas MRN: [id number] Date of Birth: 28-Dec-1955 Today's Date: 05/28/2019    Attempted to see patient for scheduled therapy session. Pt sidelying in bed and continues to complain of abdominal pain and overall not feeling well. RN in room to assess pt and aware of symptoms. Pt declines any participation in therapy this AM 2/2 ongoing pain. Pt left sidelying in bed with needs in reach. Pt missed 60 min of scheduled therapy time due to ongoing abdominal pain. Will continue per POC.   Excell Seltzer, PT, DPT  05/28/2019, 12:38 PM

## 2019-05-28 NOTE — Progress Notes (Addendum)
IR requested by Dr. Therisa Doyne for possible image-guided paracentesis.  Limited abdominal US revealed no fluid that could be safely accessed with procedure today. Images sent to Dr. Laurence Ferrari for review. Informed patient that procedure will not occur today. All questions answered and concerns addressed. Dr. Therisa Doyne made aware.  IR available in future if needed.   Bea Graff Donnell Wion, PA-C 05/28/2019, 2:54 PM

## 2019-05-28 NOTE — Progress Notes (Signed)
Recreational Therapy Session Note  Patient Details  Name: Rebecca Lucas MRN: [id number] Date of Birth: Nov 12, 1955 Today's Date: 05/28/2019  Pt declined PT/TR cotreat session this morning and she continue to not feel well.  Treatment team aware. [place] 05/28/2019, 9:43 AM

## 2019-05-28 NOTE — Progress Notes (Signed)
Occupational Therapy Session Note  Patient Details  Name: Rebecca Lucas MRN: [id number] Date of Birth: [birth year]  Today's Date: 05/28/2019 OT Individual Time: 1400-1420 OT Individual Time Calculation (min): 20 min  and Today's Date: 05/28/2019 OT Missed Time: 40 Minutes Missed Time Reason: Patient fatigue;Pain   Short Term Goals: Week 2:  OT Short Term Goal 1 (Week 2): STG=LTG due to LOS  Skilled Therapeutic Interventions/Progress Updates:    Pt seen for OT session focusing on LE ROM/stretching and functional transfers and toileting. Pt in side-lying upon arrival, cont with complaints of generalized fatigue and abdominal pain, RN already aware, medications up to date and pt declined need for intervention. She initially refused any OOB mobility, requesting therapist to stretch her LEs only. She refused to transition out of L side-lying and therapist perform hip flexion>knee extension on R/L, x10 each.  RN then alerted pt of upcoming off unit procedure and pt then requesting to complete toileting task prior to transfer off unit.  She transferred to sitting EOB with mod A using hospital bed functions. Min A squat pivot transfer to drop arm BSC. Pt able to complete "push-up" using BSC arm rests and clothing management completed total A. Pt able to complete pericare hygiene with set-up.  She returned to sitting EOB in same manner as described above and transitioned back to L side-lying with min A.  Pt left in side-lying at end of session with all needs in reach, bed alarm on.   Therapy Documentation Precautions:  Precautions Precautions: Fall, Back Precaution Comments: reviewed spinal precautions Required Braces or Orthoses: Spinal Brace Spinal Brace: Lumbar corset, Applied in sitting position Restrictions Weight Bearing Restrictions: No   Therapy/Group: Individual Therapy  Livie Vanderhoof L 05/28/2019, 7:00 AM

## 2019-05-28 NOTE — H&P (View-Only) (Signed)
[facility] Consult  Referring Provider: Courtney Heys, MD Primary Care Physician:  Caren Macadam, MD Primary Gastroenterologist: [facility]  Reason for Consultation: Epigastric pain, new onset ascites  HPI: Rebecca Lucas is a 63 y.o. 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  Patient developed sudden onset of epigastric and upper abdominal pain for the last 3 days described as sharp, throbbing and worse with meals.  Patient denies acid reflux or heartburn, denies difficulty swallowing or pain on swallowing.  Her last bowel movement was a few days ago and normal as per patient. 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  She is status post L2 laminectomy, bilateral ventriculotomy decompression of thecal sac and nerve roots with posterior segmental instrumentation of T12-L4 and arthrodesis of L1 on 05/09/2019. She was supposed to restart chemotherapy on 05/30/2019 and has been on Lovenox for DVT prophylaxis.  Patient is on Neurontin with Duragesic patch and Advil 400 mg 3 times a day and oxycodone as needed. Patient denies history of prior abdominal distention/ascites or requirement for paracentesis.  She denies yellowish discoloration of skin or change in the color of stool or urine. Patient was around 132 pounds in June 2019 when the diagnosis of lung cancer was made, she lost weight as low as 115 pounds and seems to be back to her baseline. She has history of constipation in the setting of neurogenic bowel and opioid-induced constipation, is on Dulcolax suppository daily along with Linzess 72 mcg before breakfast and MiraLAX 17 g as needed along with senna 1 tablet as needed at bedtime.  Patient states she had a colonoscopy several years ago which was  unremarkable.  Past Medical History:  Diagnosis Date  . Acute blood loss anemia   . Cancer of upper lobe of right lung [facility])     Past Surgical History:  Procedure Laterality Date  . APPLICATION OF ROBOTIC ASSISTANCE FOR SPINAL PROCEDURE N/A 05/09/2019   Procedure: APPLICATION OF ROBOTIC ASSISTANCE FOR SPINAL PROCEDURE;  Surgeon: Consuella Lose, MD;  Location: [place];  Service: Neurosurgery;  Laterality: N/A;  APPLICATION OF ROBOTIC ASSISTANCE FOR SPINAL PROCEDURE  . IR IMAGING GUIDED PORT INSERTION  06/26/2018  . POSTERIOR LUMBAR FUSION 4 LEVEL N/A 05/09/2019   Procedure: Lumbar Two Laminectomy; Thoracic Twelve-Lumbar Four instrumented stabilization;  Surgeon: Consuella Lose, MD;  Location: [place];  Service: Neurosurgery;  Laterality: N/A;  Lumbar Two Laminectomy; Thoracic Twelve-Lumbar Four instrumented stabilization    Prior to Admission medications   Medication Sig Start Date End Date Taking? Authorizing Provider  acetaminophen (TYLENOL) 500 MG tablet Take 1,000 mg by mouth every 6 (six) hours as needed for mild pain.     [provider]  butalbital-aspirin-caffeine Acquanetta Chain) 50-325-40 MG capsule Take 1 capsule by mouth every 6 (six) hours as needed for migraine.  12/05/17   [provider]  Calcium Carbonate-Vitamin D (CALCIUM-D PO) Take 1 tablet by mouth 2 (two) times daily.    [provider]  dexamethasone (DECADRON) 4 MG tablet Take 2 tablets twice a day (4 tablets total per day) the day before, the day of, and the day after chemotherapy 04/17/19   Heilingoetter, Cassandra L, PA-C  gabapentin (NEURONTIN) 300 MG capsule Take 1 capsule (300 mg total) by mouth 3 (three) times daily. 05/02/19   Heilingoetter, Cassandra L, PA-C  ibuprofen (ADVIL,MOTRIN)  200 MG tablet Take 600 mg by mouth every 6 (six) hours as needed for mild pain.     [provider]  lidocaine-prilocaine (EMLA) cream Apply 1 application topically as needed. Patient taking  differently: Apply 1 application topically as needed (pain).  06/13/18   Curt Bears, MD  linaclotide [facility]) 72 MCG capsule TAKE 1 CAPSULE BY MOUTH ONCE DAILY BEFORE BREAKFAST Patient taking differently: Take 72 mcg by mouth daily before breakfast.  05/02/19   Heilingoetter, Cassandra L, PA-C  LUTEIN PO Take 1 tablet by mouth daily.    [provider]  omeprazole (PRILOSEC) 20 MG capsule TAKE 1 CAPSULE (20 MG TOTAL) BY MOUTH DAILY. 04/08/19   Curt Bears, MD  ondansetron (ZOFRAN) 8 MG tablet TAKE 1 TABLET (8 MG TOTAL) BY MOUTH EVERY 8 HOURS AS NEEDED FOR NAUSEA OR VOMITING. Patient taking differently: Take 8 mg by mouth every 8 (eight) hours as needed for nausea or vomiting.  03/20/19   Curt Bears, MD  oxyCODONE (OXY IR/ROXICODONE) 5 MG immediate release tablet Take 1 tablet (5 mg total) by mouth every 6 (six) hours as needed for severe pain. 05/02/19   Heilingoetter, Cassandra L, PA-C  polyethylene glycol (MIRALAX / GLYCOLAX) 17 g packet Take 17 g by mouth 2 (two) times daily. 05/14/19   Arrien, Jimmy Picket, MD  traMADol (ULTRAM) 50 MG tablet Take 1 tablet (50 mg total) by mouth every 6 (six) hours as needed for moderate pain. 03/06/19   Curt Bears, MD  zolpidem (AMBIEN) 10 MG tablet Take 1 tablet (10 mg total) by mouth at bedtime as needed for sleep. 03/21/18   Curt Bears, MD    Current Facility-Administered Medications  Medication Dose Route Frequency Provider Last Rate Last Dose  . acetaminophen (TYLENOL) tablet 650 mg  650 mg Oral Q6H Cathlyn Parsons, PA-C   650 mg at 05/28/19 0859  . bisacodyl (DULCOLAX) suppository 10 mg  10 mg Rectal Daily Lovorn, Megan, MD   10 mg at 05/15/19 1842  . cephALEXin (KEFLEX) capsule 250 mg  250 mg Oral Q8H Kirsteins, Luanna Salk, MD   250 mg at 05/28/19 0612  . Chlorhexidine Gluconate Cloth 2 % PADS 6 each  6 each Topical Q0600 Courtney Heys, MD   6 each at 05/28/19 [phone]  . dexamethasone (DECADRON) tablet 3 mg  3 mg Oral Daily  AngiulliLavon Paganini, PA-C   3 mg at 05/28/19 0900  . enoxaparin (LOVENOX) injection 40 mg  40 mg Subcutaneous Q24H Lovorn, Megan, MD   40 mg at 05/27/19 0834  . fentaNYL (DURAGESIC) 75 MCG/HR 1 patch  1 patch Transdermal Q72H Cathlyn Parsons, PA-C   1 patch at 05/27/19 2048  . gabapentin (NEURONTIN) capsule 300 mg  300 mg Oral TID Cathlyn Parsons, PA-C   300 mg at 05/28/19 0859  . ibuprofen (ADVIL) tablet 400 mg  400 mg Oral TID PRN Courtney Heys, MD   400 mg at 05/26/19 1731  . linaclotide (LINZESS) capsule 72 mcg  72 mcg Oral QAC breakfast Lovorn, Jinny Blossom, MD   72 mcg at 05/28/19 0612  . ondansetron (ZOFRAN) tablet 4 mg  4 mg Oral Q6H PRN Angiulli, Lavon Paganini, PA-C       Or  . ondansetron [facility]) injection 4 mg  4 mg Intravenous Q6H PRN Angiulli, Lavon Paganini, PA-C      . oxyCODONE (Oxy IR/ROXICODONE) immediate release tablet 10 mg  10 mg Oral Q4H PRN Cathlyn Parsons, PA-C   10 mg  at 05/28/19 0859  . pantoprazole (PROTONIX) injection 40 mg  40 mg Intravenous Q12H Ronnette Juniper, MD      . polyethylene glycol (MIRALAX / GLYCOLAX) packet 17 g  17 g Oral Daily PRN Lovorn, Jinny Blossom, MD   17 g at 05/26/19 1111  . senna-docusate (Senokot-S) tablet 1 tablet  1 tablet Oral QHS PRN Angiulli, Lavon Paganini, PA-C      . sodium chloride flush (NS) 0.9 % injection 10-40 mL  10-40 mL Intracatheter PRN Lovorn, Megan, MD      . sorbitol 70 % solution 30 mL  30 mL Oral Daily PRN Angiulli, Lavon Paganini, PA-C      . sucralfate (CARAFATE) tablet 1 g  1 g Oral TID WC & HS Ronnette Juniper, MD      . traZODone (DESYREL) tablet 50 mg  50 mg Oral QHS PRN Lovorn, Jinny Blossom, MD   50 mg at 05/27/19 2155    Allergies as of 05/14/2019  . (No Known Allergies)    Family History  Problem Relation Age of Onset  . ALS Mother 49       non genetic  . Cancer Neg Hx     Social History   Socioeconomic History  . Marital status: Married    Spouse name: Not on file  . Number of children: Not on file  . Years of education: Not on file  .  Highest education level: Not on file  Occupational History  . Not on file  Social Needs  . Financial resource strain: Not on file  . Food insecurity    Worry: Not on file    Inability: Not on file  . Transportation needs    Medical: Not on file    Non-medical: Not on file  Tobacco Use  . Smoking status: Never Smoker  . Smokeless tobacco: Never Used  Substance and Sexual Activity  . Alcohol use: Yes    Comment: occasionally  . Drug use: Never  . Sexual activity: Yes  Lifestyle  . Physical activity    Days per week: Not on file    Minutes per session: Not on file  . Stress: Not on file  Relationships  . Social Herbalist on phone: Not on file    Gets together: Not on file    Attends religious service: Not on file    Active member of club or organization: Not on file    Attends meetings of clubs or organizations: Not on file    Relationship status: Not on file  . Intimate partner violence    Fear of current or ex partner: Not on file    Emotionally abused: Not on file    Physically abused: Not on file    Forced sexual activity: Not on file  Other Topics Concern  . Not on file  Social History Narrative   Married with two children. Her husband is Dr. [name], a physician with occupational health. She has worked several jobs Energy manager, Glass blower/designer, and now the [employer].     Review of Systems: Positive for: GI: Described in detail in HPI.    Gen: anorexia, fatigue, weakness, malaise, involuntary weight loss,Denies any fever, chills, rigors, night sweats and sleep disorder CV: Denies chest pain, angina, palpitations, syncope, orthopnea, PND, peripheral edema, and claudication. Resp: Denies dyspnea, cough, sputum, wheezing, coughing up blood. GU : Denies urinary burning, blood in urine, urinary frequency, urinary hesitancy, nocturnal urination, and urinary incontinence. MS: Denies  joint pain or swelling.  Left leg weakness Derm:  Denies rash, itching, oral ulcerations, hives, unhealing ulcers.  Psych: Denies depression, anxiety, memory loss, suicidal ideation, hallucinations,  and confusion. Heme: Denies bruising, bleeding, and enlarged lymph nodes. Neuro:  Denies any headaches, dizziness, paresthesias. Endo:  Denies any problems with DM, thyroid, adrenal function.  Physical Exam: Vital signs in last 24 hours: Temp:  [97.9 F (36.6 C)-99.3 F (37.4 C)] 99.3 F (37.4 C) (09/15 0348) Pulse Rate:  [112-115] 112 (09/15 0348) Resp:  [18] 18 (09/15 0348) BP: (146-158)/(83-94) 146/83 (09/15 0348) SpO2:  [98 %-100 %] 98 % (09/15 0348) Last BM Date: 05/26/19  General:   Alert, pleasant and cooperative in NAD, frail, chronically ill-appearing, lying on bed on left side Head:  Normocephalic and atraumatic. Eyes:  Sclera clear, no icterus.   Mild pallor Ears:  Normal auditory acuity. Nose:  No deformity, discharge,  or lesions. Mouth:  No deformity or lesions.  Oropharynx pink & moist. Neck:  Supple; no masses or thyromegaly. Lungs:  Clear throughout to auscultation.   No wheezes, crackles, or rhonchi. No acute distress. Heart:  Regular rate and rhythm; no murmurs, clicks, rubs,  or gallops. Extremities: Left lower extremity in a cast, unable to move left lower extremity Neurologic:  Alert and  oriented x4;  grossly normal neurologically. Skin:  Intact without significant lesions or rashes. Psych:  Alert and cooperative. Normal mood and affect. Abdomen:  Soft, tender in epigastric and upper abdominal area and distended, presence of shifting dullness. No masses, hepatosplenomegaly or hernias noted. Normal bowel sounds, without guarding, and without rebound.         Lab Results: Recent Labs    05/27/19 0420  WBC 17.8*  HGB 10.7*  HCT 33.2*  PLT 203   BMET Recent Labs    05/27/19 0420  NA 135  K 4.2  CL 101  CO2 23  GLUCOSE 93  BUN 18  CREATININE 0.57  CALCIUM 8.5*   LFT Recent Labs     05/27/19 0420  PROT 5.7*  ALBUMIN 2.5*  AST 39  ALT 89*  ALKPHOS 118  BILITOT 0.5   PT/INR No results for input(s): LABPROT, INR in the last 72 hours.  Studies/Results: Ct Abdomen Pelvis Wo Contrast  Result Date: 05/27/2019 CLINICAL DATA:  Abdominal pain. Gastroenteritis or colitis suspected. History of metastatic lung cancer. EXAM: CT ABDOMEN AND PELVIS WITHOUT CONTRAST TECHNIQUE: Multidetector CT imaging of the abdomen and pelvis was performed following the standard protocol without IV contrast. COMPARISON:  02/19/2019. FINDINGS: Lower chest: Small bilateral pleural effusions. Hepatobiliary: No focal liver abnormality identified. There is mild pericholecystic fluid. There is sludge identified layering within the gallbladder. No stones noted. No bile duct dilatation. Pancreas: Unremarkable. No pancreatic ductal dilatation or surrounding inflammatory changes. Spleen: Normal in size without focal abnormality. Adrenals/Urinary Tract: Normal appearance of the adrenal glands. No kidney mass or hydronephrosis identified bilaterally. There is moderate distension of the urinary bladder without focal abnormality. Stomach/Bowel: The stomach is normal. Within the limitations of unenhanced technique there is no small bowel wall thickening, inflammation or distension. The appendix is visualized and appears normal. No colonic wall thickening, inflammation or distension. Vascular/Lymphatic: Aortic atherosclerosis without aneurysm. Similar appearance prevertebral soft tissue thickening and retroperitoneal fat stranding associated with the L2 compression deformity. Asymmetric soft tissue thickening of the right crus of diaphragm which is contiguous with the soft tissue thickening around L2 is again noted. No retroperitoneal adenopathy. No pelvic or inguinal adenopathy. Reproductive: Uterus  and bilateral adnexa are unremarkable. Other: New small to moderate volume of ascites identified within the abdomen and pelvis.  Musculoskeletal: Status post posterior hardware fixation at T12 through L4. Laminectomy at L2 is again noted. Similar appearance of pathologic compression deformity involving the L2 vertebra. Similar appearance of metastasis to L1, L2 and L3 vertebra. Unchanged right sacral wing metastasis. IMPRESSION: 1. Interval development of small to moderate volume of ascites within the abdomen and pelvis. Etiology indeterminate. 2. L1 through L3 vertebral metastasis with pathologic fracture involving the L2 vertebra. Abnormal increased prevertebral soft tissue and retroperitoneal soft tissue stranding is again noted which appears similar to the CT of the lumbar spine from 05/08/2019. Associated asymmetric soft tissue thickening of the right crus of diaphragm is similar to 05/08/19 but new from CT AP from 02/19/2019. This area is incompletely characterized without IV contrast material. 3. Gallbladder sludge. Electronically Signed   By: Kerby Moors M.D.   On: 05/27/2019 14:20   Dg Abd 1 View  Result Date: 05/27/2019 CLINICAL DATA:  Abdominal pain EXAM: ABDOMEN - 1 VIEW COMPARISON:  CT 05/08/2019 FINDINGS: The bowel gas pattern is normal. No radio-opaque calculi or other significant radiographic abnormality are seen. Lumbar spine. Posterior fusion changes in the IMPRESSION: No acute findings. Electronically Signed   By: Rolm Baptise M.D.   On: 05/27/2019 10:44    Impression: Small to moderate volume ascites within the abdomen and pelvis, etiology indeterminant Liver appears unremarkable on CAT scan performed without contrast, normal stomach, small bowel and colon  Severe epigastric and upper abdominal pain, use of NSAIDs, possible peptic ulcer disease. L1-L3 vertebral metastases with pathological fracture involving L2 Gallbladder sludge Normocytic anemia, leukocytosis, normal liver enzymes, malnutrition(low total protein and albumin)  Plan: Plan ultrasound-guided paracentesis to determine etiology of ascites  fluid, possible malignancy. Ascitic fluid to be sent for total protein and albumin, along with Gram stain, culture, cell count and differential to rule out peritonitis.  Also to be sent for cytology. Plan endoscopy in a.m. as patient may have developed peptic ulcer disease in the setting of steroid and NSAID use. Keep patient n.p.o. post midnight. Meanwhile increase Protonix to 40 mg IV twice daily, start sucralfate 1 g 3 times daily. The risks and the benefits of the procedure were discussed with the patient in details. She understands and verbalizes consent.   LOS: 14 days   Ronnette Juniper, MD  05/28/2019, 9:49 AM  Pager [phone] If no answer or after 5 PM call [phone]

## 2019-05-28 NOTE — Consult Note (Signed)
[facility] Consult  Referring Provider: Courtney Heys, MD Primary Care Physician:  Caren Macadam, MD Primary Gastroenterologist: [facility]  Reason for Consultation: Epigastric pain, new onset ascites  HPI: Rebecca Lucas is a 63 y.o. 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  Patient developed sudden onset of epigastric and upper abdominal pain for the last 3 days described as sharp, throbbing and worse with meals.  Patient denies acid reflux or heartburn, denies difficulty swallowing or pain on swallowing.  Her last bowel movement was a few days ago and normal as per patient. 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  She is status post L2 laminectomy, bilateral ventriculotomy decompression of thecal sac and nerve roots with posterior segmental instrumentation of T12-L4 and arthrodesis of L1 on 05/09/2019. She was supposed to restart chemotherapy on 05/30/2019 and has been on Lovenox for DVT prophylaxis.  Patient is on Neurontin with Duragesic patch and Advil 400 mg 3 times a day and oxycodone as needed. Patient denies history of prior abdominal distention/ascites or requirement for paracentesis.  She denies yellowish discoloration of skin or change in the color of stool or urine. Patient was around 132 pounds in June 2019 when the diagnosis of lung cancer was made, she lost weight as low as 115 pounds and seems to be back to her baseline. She has history of constipation in the setting of neurogenic bowel and opioid-induced constipation, is on Dulcolax suppository daily along with Linzess 72 mcg before breakfast and MiraLAX 17 g as needed along with senna 1 tablet as needed at bedtime.  Patient states she had a colonoscopy several years ago which was  unremarkable.  Past Medical History:  Diagnosis Date  . Acute blood loss anemia   . Cancer of upper lobe of right lung [facility])     Past Surgical History:  Procedure Laterality Date  . APPLICATION OF ROBOTIC ASSISTANCE FOR SPINAL PROCEDURE N/A 05/09/2019   Procedure: APPLICATION OF ROBOTIC ASSISTANCE FOR SPINAL PROCEDURE;  Surgeon: Consuella Lose, MD;  Location: [place];  Service: Neurosurgery;  Laterality: N/A;  APPLICATION OF ROBOTIC ASSISTANCE FOR SPINAL PROCEDURE  . IR IMAGING GUIDED PORT INSERTION  06/26/2018  . POSTERIOR LUMBAR FUSION 4 LEVEL N/A 05/09/2019   Procedure: Lumbar Two Laminectomy; Thoracic Twelve-Lumbar Four instrumented stabilization;  Surgeon: Consuella Lose, MD;  Location: [place];  Service: Neurosurgery;  Laterality: N/A;  Lumbar Two Laminectomy; Thoracic Twelve-Lumbar Four instrumented stabilization    Prior to Admission medications   Medication Sig Start Date End Date Taking? Authorizing Provider  acetaminophen (TYLENOL) 500 MG tablet Take 1,000 mg by mouth every 6 (six) hours as needed for mild pain.     [provider]  butalbital-aspirin-caffeine Acquanetta Chain) 50-325-40 MG capsule Take 1 capsule by mouth every 6 (six) hours as needed for migraine.  12/05/17   [provider]  Calcium Carbonate-Vitamin D (CALCIUM-D PO) Take 1 tablet by mouth 2 (two) times daily.    [provider]  dexamethasone (DECADRON) 4 MG tablet Take 2 tablets twice a day (4 tablets total per day) the day before, the day of, and the day after chemotherapy 04/17/19   Heilingoetter, Cassandra L, PA-C  gabapentin (NEURONTIN) 300 MG capsule Take 1 capsule (300 mg total) by mouth 3 (three) times daily. 05/02/19   Heilingoetter, Cassandra L, PA-C  ibuprofen (ADVIL,MOTRIN)  200 MG tablet Take 600 mg by mouth every 6 (six) hours as needed for mild pain.     [provider]  lidocaine-prilocaine (EMLA) cream Apply 1 application topically as needed. Patient taking  differently: Apply 1 application topically as needed (pain).  06/13/18   Curt Bears, MD  linaclotide [facility]) 72 MCG capsule TAKE 1 CAPSULE BY MOUTH ONCE DAILY BEFORE BREAKFAST Patient taking differently: Take 72 mcg by mouth daily before breakfast.  05/02/19   Heilingoetter, Cassandra L, PA-C  LUTEIN PO Take 1 tablet by mouth daily.    [provider]  omeprazole (PRILOSEC) 20 MG capsule TAKE 1 CAPSULE (20 MG TOTAL) BY MOUTH DAILY. 04/08/19   Curt Bears, MD  ondansetron (ZOFRAN) 8 MG tablet TAKE 1 TABLET (8 MG TOTAL) BY MOUTH EVERY 8 HOURS AS NEEDED FOR NAUSEA OR VOMITING. Patient taking differently: Take 8 mg by mouth every 8 (eight) hours as needed for nausea or vomiting.  03/20/19   Curt Bears, MD  oxyCODONE (OXY IR/ROXICODONE) 5 MG immediate release tablet Take 1 tablet (5 mg total) by mouth every 6 (six) hours as needed for severe pain. 05/02/19   Heilingoetter, Cassandra L, PA-C  polyethylene glycol (MIRALAX / GLYCOLAX) 17 g packet Take 17 g by mouth 2 (two) times daily. 05/14/19   Arrien, Jimmy Picket, MD  traMADol (ULTRAM) 50 MG tablet Take 1 tablet (50 mg total) by mouth every 6 (six) hours as needed for moderate pain. 03/06/19   Curt Bears, MD  zolpidem (AMBIEN) 10 MG tablet Take 1 tablet (10 mg total) by mouth at bedtime as needed for sleep. 03/21/18   Curt Bears, MD    Current Facility-Administered Medications  Medication Dose Route Frequency Provider Last Rate Last Dose  . acetaminophen (TYLENOL) tablet 650 mg  650 mg Oral Q6H Cathlyn Parsons, PA-C   650 mg at 05/28/19 0859  . bisacodyl (DULCOLAX) suppository 10 mg  10 mg Rectal Daily Lovorn, Megan, MD   10 mg at 05/15/19 1842  . cephALEXin (KEFLEX) capsule 250 mg  250 mg Oral Q8H Kirsteins, Luanna Salk, MD   250 mg at 05/28/19 0612  . Chlorhexidine Gluconate Cloth 2 % PADS 6 each  6 each Topical Q0600 Courtney Heys, MD   6 each at 05/28/19 [phone]  . dexamethasone (DECADRON) tablet 3 mg  3 mg Oral Daily  AngiulliLavon Paganini, PA-C   3 mg at 05/28/19 0900  . enoxaparin (LOVENOX) injection 40 mg  40 mg Subcutaneous Q24H Lovorn, Megan, MD   40 mg at 05/27/19 0834  . fentaNYL (DURAGESIC) 75 MCG/HR 1 patch  1 patch Transdermal Q72H Cathlyn Parsons, PA-C   1 patch at 05/27/19 2048  . gabapentin (NEURONTIN) capsule 300 mg  300 mg Oral TID Cathlyn Parsons, PA-C   300 mg at 05/28/19 0859  . ibuprofen (ADVIL) tablet 400 mg  400 mg Oral TID PRN Courtney Heys, MD   400 mg at 05/26/19 1731  . linaclotide (LINZESS) capsule 72 mcg  72 mcg Oral QAC breakfast Lovorn, Jinny Blossom, MD   72 mcg at 05/28/19 0612  . ondansetron (ZOFRAN) tablet 4 mg  4 mg Oral Q6H PRN Angiulli, Lavon Paganini, PA-C       Or  . ondansetron [facility]) injection 4 mg  4 mg Intravenous Q6H PRN Angiulli, Lavon Paganini, PA-C      . oxyCODONE (Oxy IR/ROXICODONE) immediate release tablet 10 mg  10 mg Oral Q4H PRN Cathlyn Parsons, PA-C   10 mg  at 05/28/19 0859  . pantoprazole (PROTONIX) injection 40 mg  40 mg Intravenous Q12H Ronnette Juniper, MD      . polyethylene glycol (MIRALAX / GLYCOLAX) packet 17 g  17 g Oral Daily PRN Lovorn, Jinny Blossom, MD   17 g at 05/26/19 1111  . senna-docusate (Senokot-S) tablet 1 tablet  1 tablet Oral QHS PRN Angiulli, Lavon Paganini, PA-C      . sodium chloride flush (NS) 0.9 % injection 10-40 mL  10-40 mL Intracatheter PRN Lovorn, Megan, MD      . sorbitol 70 % solution 30 mL  30 mL Oral Daily PRN Angiulli, Lavon Paganini, PA-C      . sucralfate (CARAFATE) tablet 1 g  1 g Oral TID WC & HS Ronnette Juniper, MD      . traZODone (DESYREL) tablet 50 mg  50 mg Oral QHS PRN Lovorn, Jinny Blossom, MD   50 mg at 05/27/19 2155    Allergies as of 05/14/2019  . (No Known Allergies)    Family History  Problem Relation Age of Onset  . ALS Mother 75       non genetic  . Cancer Neg Hx     Social History   Socioeconomic History  . Marital status: Married    Spouse name: Not on file  . Number of children: Not on file  . Years of education: Not on file  .  Highest education level: Not on file  Occupational History  . Not on file  Social Needs  . Financial resource strain: Not on file  . Food insecurity    Worry: Not on file    Inability: Not on file  . Transportation needs    Medical: Not on file    Non-medical: Not on file  Tobacco Use  . Smoking status: Never Smoker  . Smokeless tobacco: Never Used  Substance and Sexual Activity  . Alcohol use: Yes    Comment: occasionally  . Drug use: Never  . Sexual activity: Yes  Lifestyle  . Physical activity    Days per week: Not on file    Minutes per session: Not on file  . Stress: Not on file  Relationships  . Social Herbalist on phone: Not on file    Gets together: Not on file    Attends religious service: Not on file    Active member of club or organization: Not on file    Attends meetings of clubs or organizations: Not on file    Relationship status: Not on file  . Intimate partner violence    Fear of current or ex partner: Not on file    Emotionally abused: Not on file    Physically abused: Not on file    Forced sexual activity: Not on file  Other Topics Concern  . Not on file  Social History Narrative   Married with two children. Her husband is Dr. [name], a physician with occupational health. She has worked several jobs Energy manager, Glass blower/designer, and now the [employer].     Review of Systems: Positive for: GI: Described in detail in HPI.    Gen: anorexia, fatigue, weakness, malaise, involuntary weight loss,Denies any fever, chills, rigors, night sweats and sleep disorder CV: Denies chest pain, angina, palpitations, syncope, orthopnea, PND, peripheral edema, and claudication. Resp: Denies dyspnea, cough, sputum, wheezing, coughing up blood. GU : Denies urinary burning, blood in urine, urinary frequency, urinary hesitancy, nocturnal urination, and urinary incontinence. MS: Denies  joint pain or swelling.  Left leg weakness Derm:  Denies rash, itching, oral ulcerations, hives, unhealing ulcers.  Psych: Denies depression, anxiety, memory loss, suicidal ideation, hallucinations,  and confusion. Heme: Denies bruising, bleeding, and enlarged lymph nodes. Neuro:  Denies any headaches, dizziness, paresthesias. Endo:  Denies any problems with DM, thyroid, adrenal function.  Physical Exam: Vital signs in last 24 hours: Temp:  [97.9 F (36.6 C)-99.3 F (37.4 C)] 99.3 F (37.4 C) (09/15 0348) Pulse Rate:  [112-115] 112 (09/15 0348) Resp:  [18] 18 (09/15 0348) BP: (146-158)/(83-94) 146/83 (09/15 0348) SpO2:  [98 %-100 %] 98 % (09/15 0348) Last BM Date: 05/26/19  General:   Alert, pleasant and cooperative in NAD, frail, chronically ill-appearing, lying on bed on left side Head:  Normocephalic and atraumatic. Eyes:  Sclera clear, no icterus.   Mild pallor Ears:  Normal auditory acuity. Nose:  No deformity, discharge,  or lesions. Mouth:  No deformity or lesions.  Oropharynx pink & moist. Neck:  Supple; no masses or thyromegaly. Lungs:  Clear throughout to auscultation.   No wheezes, crackles, or rhonchi. No acute distress. Heart:  Regular rate and rhythm; no murmurs, clicks, rubs,  or gallops. Extremities: Left lower extremity in a cast, unable to move left lower extremity Neurologic:  Alert and  oriented x4;  grossly normal neurologically. Skin:  Intact without significant lesions or rashes. Psych:  Alert and cooperative. Normal mood and affect. Abdomen:  Soft, tender in epigastric and upper abdominal area and distended, presence of shifting dullness. No masses, hepatosplenomegaly or hernias noted. Normal bowel sounds, without guarding, and without rebound.         Lab Results: Recent Labs    05/27/19 0420  WBC 17.8*  HGB 10.7*  HCT 33.2*  PLT 203   BMET Recent Labs    05/27/19 0420  NA 135  K 4.2  CL 101  CO2 23  GLUCOSE 93  BUN 18  CREATININE 0.57  CALCIUM 8.5*   LFT Recent Labs     05/27/19 0420  PROT 5.7*  ALBUMIN 2.5*  AST 39  ALT 89*  ALKPHOS 118  BILITOT 0.5   PT/INR No results for input(s): LABPROT, INR in the last 72 hours.  Studies/Results: Ct Abdomen Pelvis Wo Contrast  Result Date: 05/27/2019 CLINICAL DATA:  Abdominal pain. Gastroenteritis or colitis suspected. History of metastatic lung cancer. EXAM: CT ABDOMEN AND PELVIS WITHOUT CONTRAST TECHNIQUE: Multidetector CT imaging of the abdomen and pelvis was performed following the standard protocol without IV contrast. COMPARISON:  02/19/2019. FINDINGS: Lower chest: Small bilateral pleural effusions. Hepatobiliary: No focal liver abnormality identified. There is mild pericholecystic fluid. There is sludge identified layering within the gallbladder. No stones noted. No bile duct dilatation. Pancreas: Unremarkable. No pancreatic ductal dilatation or surrounding inflammatory changes. Spleen: Normal in size without focal abnormality. Adrenals/Urinary Tract: Normal appearance of the adrenal glands. No kidney mass or hydronephrosis identified bilaterally. There is moderate distension of the urinary bladder without focal abnormality. Stomach/Bowel: The stomach is normal. Within the limitations of unenhanced technique there is no small bowel wall thickening, inflammation or distension. The appendix is visualized and appears normal. No colonic wall thickening, inflammation or distension. Vascular/Lymphatic: Aortic atherosclerosis without aneurysm. Similar appearance prevertebral soft tissue thickening and retroperitoneal fat stranding associated with the L2 compression deformity. Asymmetric soft tissue thickening of the right crus of diaphragm which is contiguous with the soft tissue thickening around L2 is again noted. No retroperitoneal adenopathy. No pelvic or inguinal adenopathy. Reproductive: Uterus  and bilateral adnexa are unremarkable. Other: New small to moderate volume of ascites identified within the abdomen and pelvis.  Musculoskeletal: Status post posterior hardware fixation at T12 through L4. Laminectomy at L2 is again noted. Similar appearance of pathologic compression deformity involving the L2 vertebra. Similar appearance of metastasis to L1, L2 and L3 vertebra. Unchanged right sacral wing metastasis. IMPRESSION: 1. Interval development of small to moderate volume of ascites within the abdomen and pelvis. Etiology indeterminate. 2. L1 through L3 vertebral metastasis with pathologic fracture involving the L2 vertebra. Abnormal increased prevertebral soft tissue and retroperitoneal soft tissue stranding is again noted which appears similar to the CT of the lumbar spine from 05/08/2019. Associated asymmetric soft tissue thickening of the right crus of diaphragm is similar to 05/08/19 but new from CT AP from 02/19/2019. This area is incompletely characterized without IV contrast material. 3. Gallbladder sludge. Electronically Signed   By: Kerby Moors M.D.   On: 05/27/2019 14:20   Dg Abd 1 View  Result Date: 05/27/2019 CLINICAL DATA:  Abdominal pain EXAM: ABDOMEN - 1 VIEW COMPARISON:  CT 05/08/2019 FINDINGS: The bowel gas pattern is normal. No radio-opaque calculi or other significant radiographic abnormality are seen. Lumbar spine. Posterior fusion changes in the IMPRESSION: No acute findings. Electronically Signed   By: Rolm Baptise M.D.   On: 05/27/2019 10:44    Impression: Small to moderate volume ascites within the abdomen and pelvis, etiology indeterminant Liver appears unremarkable on CAT scan performed without contrast, normal stomach, small bowel and colon  Severe epigastric and upper abdominal pain, use of NSAIDs, possible peptic ulcer disease. L1-L3 vertebral metastases with pathological fracture involving L2 Gallbladder sludge Normocytic anemia, leukocytosis, normal liver enzymes, malnutrition(low total protein and albumin)  Plan: Plan ultrasound-guided paracentesis to determine etiology of ascites  fluid, possible malignancy. Ascitic fluid to be sent for total protein and albumin, along with Gram stain, culture, cell count and differential to rule out peritonitis.  Also to be sent for cytology. Plan endoscopy in a.m. as patient may have developed peptic ulcer disease in the setting of steroid and NSAID use. Keep patient n.p.o. post midnight. Meanwhile increase Protonix to 40 mg IV twice daily, start sucralfate 1 g 3 times daily. The risks and the benefits of the procedure were discussed with the patient in details. She understands and verbalizes consent.   LOS: 14 days   Ronnette Juniper, MD  05/28/2019, 9:49 AM  Pager [phone] If no answer or after 5 PM call [phone]

## 2019-05-29 ENCOUNTER — Ambulatory Visit (HOSPITAL_COMMUNITY): Admission: RE | Admit: 2019-05-29 | Payer: 59 | Source: Home / Self Care | Admitting: Gastroenterology

## 2019-05-29 ENCOUNTER — Inpatient Hospital Stay (HOSPITAL_COMMUNITY): Payer: 59 | Admitting: Certified Registered Nurse Anesthetist

## 2019-05-29 ENCOUNTER — Inpatient Hospital Stay (HOSPITAL_COMMUNITY): Payer: 59

## 2019-05-29 ENCOUNTER — Inpatient Hospital Stay (HOSPITAL_COMMUNITY): Payer: 59 | Admitting: Occupational Therapy

## 2019-05-29 ENCOUNTER — Inpatient Hospital Stay (HOSPITAL_COMMUNITY): Payer: 59 | Admitting: Physical Therapy

## 2019-05-29 ENCOUNTER — Encounter (HOSPITAL_COMMUNITY)
Admission: RE | Disposition: A | Discharge status: Home / Self Care | Payer: Self-pay | Source: Intra-hospital | Attending: Physical Medicine and Rehabilitation

## 2019-05-29 ENCOUNTER — Encounter (HOSPITAL_COMMUNITY): Payer: Self-pay

## 2019-05-29 DIAGNOSIS — K229 Disease of esophagus, unspecified: Secondary | ICD-10-CM | POA: Diagnosis not present

## 2019-05-29 DIAGNOSIS — R140 Abdominal distension (gaseous): Secondary | ICD-10-CM | POA: Diagnosis not present

## 2019-05-29 DIAGNOSIS — R109 Unspecified abdominal pain: Secondary | ICD-10-CM | POA: Diagnosis not present

## 2019-05-29 DIAGNOSIS — K259 Gastric ulcer, unspecified as acute or chronic, without hemorrhage or perforation: Secondary | ICD-10-CM | POA: Diagnosis not present

## 2019-05-29 DIAGNOSIS — K3189 Other diseases of stomach and duodenum: Secondary | ICD-10-CM | POA: Diagnosis not present

## 2019-05-29 DIAGNOSIS — D62 Acute posthemorrhagic anemia: Secondary | ICD-10-CM | POA: Diagnosis not present

## 2019-05-29 HISTORY — PX: BIOPSY: SHX5522

## 2019-05-29 HISTORY — PX: ESOPHAGEAL BRUSHING: SHX6842

## 2019-05-29 HISTORY — PX: ESOPHAGOGASTRODUODENOSCOPY (EGD) WITH PROPOFOL: SHX5813

## 2019-05-29 LAB — COMPREHENSIVE METABOLIC PANEL
ALT: 61 U/L — ABNORMAL HIGH (ref 0–44)
AST: 35 U/L (ref 15–41)
Albumin: 2.1 g/dL — ABNORMAL LOW (ref 3.5–5.0)
Alkaline Phosphatase: 104 U/L (ref 38–126)
Anion gap: 8 (ref 5–15)
BUN: 12 mg/dL (ref 8–23)
CO2: 22 mmol/L (ref 22–32)
Calcium: 7.5 mg/dL — ABNORMAL LOW (ref 8.9–10.3)
Chloride: 99 mmol/L (ref 98–111)
Creatinine, Ser: 0.43 mg/dL — ABNORMAL LOW (ref 0.44–1.00)
GFR calc Af Amer: >60 mL/min (ref >60)
GFR calc non Af Amer: >60 mL/min (ref >60)
Glucose, Bld: 119 mg/dL — ABNORMAL HIGH (ref 70–99)
Potassium: 3.5 mmol/L (ref 3.5–5.1)
Sodium: 129 mmol/L — ABNORMAL LOW (ref 135–145)
Total Bilirubin: 0.5 mg/dL (ref 0.3–1.2)
Total Protein: 4.9 g/dL — ABNORMAL LOW (ref 6.5–8.1)

## 2019-05-29 LAB — CBC WITH DIFFERENTIAL/PLATELET
Abs Immature Granulocytes: 0.26 10*3/uL — ABNORMAL HIGH (ref 0.00–0.07)
Basophils Absolute: 0 10*3/uL (ref 0.0–0.1)
Basophils Relative: 0 %
Eosinophils Absolute: 0 10*3/uL (ref 0.0–0.5)
Eosinophils Relative: 0 %
HCT: 29.6 % — ABNORMAL LOW (ref 36.0–46.0)
Hemoglobin: 10.3 g/dL — ABNORMAL LOW (ref 12.0–15.0)
Immature Granulocytes: 2 %
Lymphocytes Relative: 0 %
Lymphs Abs: 0.1 10*3/uL — ABNORMAL LOW (ref 0.7–4.0)
MCH: 33.3 pg (ref 26.0–34.0)
MCHC: 34.8 g/dL (ref 30.0–36.0)
MCV: 95.8 fL (ref 80.0–100.0)
Monocytes Absolute: 0.4 10*3/uL (ref 0.1–1.0)
Monocytes Relative: 3 %
Neutro Abs: 10.9 10*3/uL — ABNORMAL HIGH (ref 1.7–7.7)
Neutrophils Relative %: 95 %
Platelets: 116 10*3/uL — ABNORMAL LOW (ref 150–400)
RBC: 3.09 MIL/uL — ABNORMAL LOW (ref 3.87–5.11)
RDW: 16 % — ABNORMAL HIGH (ref 11.5–15.5)
WBC: 11.6 10*3/uL — ABNORMAL HIGH (ref 4.0–10.5)
nRBC: 0 % (ref 0.0–0.2)

## 2019-05-29 LAB — LIPASE, BLOOD: Lipase: 36 U/L (ref 11–51)

## 2019-05-29 SURGERY — ESOPHAGOGASTRODUODENOSCOPY (EGD) WITH PROPOFOL
Anesthesia: Monitor Anesthesia Care

## 2019-05-29 MED ORDER — POTASSIUM CHLORIDE CRYS ER 20 MEQ PO TBCR: 40.0000 meq | EXTENDED_RELEASE_TABLET | Freq: Once | ORAL | Status: DC

## 2019-05-29 MED ORDER — PROPOFOL 10 MG/ML IV BOLUS
INTRAVENOUS | Status: DC | PRN
  Administered 2019-05-29: 40 mg via INTRAVENOUS
  Administered 2019-05-29: 25 mg via INTRAVENOUS

## 2019-05-29 MED ORDER — ACETAMINOPHEN 500 MG PO TABS
1000.0000 mg | ORAL_TABLET | Freq: Four times a day (QID) | ORAL | Status: DC | PRN
  Administered 2019-06-03 – 2019-06-06 (×2): 1000 mg via ORAL
  Filled 2019-05-29 (×2): qty 2

## 2019-05-29 MED ORDER — FENTANYL 75 MCG/HR TD PT72
1.0000 | MEDICATED_PATCH | TRANSDERMAL | Status: DC
  Administered 2019-05-29: 15:00:00 1 via TRANSDERMAL

## 2019-05-29 MED ORDER — CALCIUM CARBONATE-VITAMIN D 500-200 MG-UNIT PO TABS
ORAL_TABLET | Freq: Two times a day (BID) | ORAL | Status: DC
  Administered 2019-05-29 – 2019-06-05 (×14): 1 via ORAL
  Administered 2019-06-05: 20:00:00 via ORAL
  Administered 2019-06-06 – 2019-06-07 (×3): 1 via ORAL
  Filled 2019-05-29 (×18): qty 1

## 2019-05-29 MED ORDER — SODIUM CHLORIDE 0.9 % NICU IV INFUSION SIMPLE: INJECTION | INTRAVENOUS | Status: DC

## 2019-05-29 MED ORDER — LIDOCAINE-PRILOCAINE 2.5-2.5 % EX CREA
1.0000 "application " | TOPICAL_CREAM | CUTANEOUS | Status: DC | PRN
  Filled 2019-05-29: qty 5

## 2019-05-29 MED ORDER — POLYETHYLENE GLYCOL 3350 17 G PO PACK
17.0000 g | PACK | Freq: Two times a day (BID) | ORAL | Status: DC
  Administered 2019-05-29 – 2019-05-31 (×3): 17 g via ORAL
  Filled 2019-05-29 (×4): qty 1

## 2019-05-29 MED ORDER — GABAPENTIN 300 MG PO CAPS
300.0000 mg | ORAL_CAPSULE | Freq: Three times a day (TID) | ORAL | Status: DC
  Administered 2019-05-29 – 2019-06-07 (×27): 300 mg via ORAL
  Filled 2019-05-29 (×26): qty 1

## 2019-05-29 MED ORDER — IBUPROFEN 400 MG PO TABS: 600.0000 mg | ORAL_TABLET | Freq: Four times a day (QID) | ORAL | Status: DC | PRN

## 2019-05-29 MED ORDER — LUTEIN 6 MG PO CAPS: ORAL_CAPSULE | Freq: Every day | ORAL | Status: DC

## 2019-05-29 MED ORDER — TRAMADOL HCL 50 MG PO TABS
50.0000 mg | ORAL_TABLET | Freq: Four times a day (QID) | ORAL | Status: DC | PRN
  Administered 2019-05-30 – 2019-06-07 (×20): 50 mg via ORAL
  Filled 2019-05-29 (×22): qty 1

## 2019-05-29 MED ORDER — PANTOPRAZOLE SODIUM 40 MG PO TBEC
40.0000 mg | DELAYED_RELEASE_TABLET | Freq: Two times a day (BID) | ORAL | Status: DC
  Administered 2019-05-29 – 2019-06-07 (×18): 40 mg via ORAL
  Filled 2019-05-29 (×18): qty 1

## 2019-05-29 MED ORDER — LINACLOTIDE 72 MCG PO CAPS: 72.0000 ug | ORAL_CAPSULE | Freq: Every day | ORAL | Status: DC

## 2019-05-29 MED ORDER — FLUCONAZOLE 100 MG PO TABS: 100.0000 mg | ORAL_TABLET | Freq: Every day | ORAL | Status: DC

## 2019-05-29 MED ORDER — BUTALBITAL-ASPIRIN-CAFFEINE 50-325-40 MG PO CAPS: 1.0000 | ORAL_CAPSULE | Freq: Four times a day (QID) | ORAL | Status: DC | PRN

## 2019-05-29 MED ORDER — OXYCODONE HCL 5 MG PO TABS
5.0000 mg | ORAL_TABLET | Freq: Four times a day (QID) | ORAL | Status: DC | PRN
  Administered 2019-05-29 – 2019-06-04 (×16): 5 mg via ORAL
  Filled 2019-05-29 (×19): qty 1

## 2019-05-29 MED ORDER — PROPOFOL 500 MG/50ML IV EMUL
INTRAVENOUS | Status: DC | PRN
  Administered 2019-05-29: 100 ug/kg/min via INTRAVENOUS

## 2019-05-29 MED ORDER — ONDANSETRON HCL 4 MG PO TABS: 8.0000 mg | ORAL_TABLET | Freq: Three times a day (TID) | ORAL | Status: DC | PRN

## 2019-05-29 MED ORDER — SODIUM CHLORIDE 0.9 % IV SOLN
INTRAVENOUS | Status: DC
  Administered 2019-05-29 – 2019-06-02 (×8): via INTRAVENOUS

## 2019-05-29 MED ORDER — FLUCONAZOLE 100 MG PO TABS
100.0000 mg | ORAL_TABLET | Freq: Every day | ORAL | Status: AC
  Administered 2019-05-29 – 2019-06-04 (×7): 100 mg via ORAL
  Filled 2019-05-29 (×8): qty 1

## 2019-05-29 MED ORDER — ZOLPIDEM TARTRATE 5 MG PO TABS
5.0000 mg | ORAL_TABLET | Freq: Every evening | ORAL | Status: DC | PRN
  Administered 2019-06-01 – 2019-06-04 (×4): 5 mg via ORAL
  Filled 2019-05-29 (×5): qty 1

## 2019-05-29 MED ORDER — SODIUM CHLORIDE 0.9 % NICU IV INFUSION SIMPLE
INJECTION | INTRAVENOUS | Status: DC
  Filled 2019-05-29 (×2): qty 500

## 2019-05-29 MED ORDER — PANTOPRAZOLE SODIUM 40 MG PO TBEC: 40.0000 mg | DELAYED_RELEASE_TABLET | Freq: Every day | ORAL | Status: DC

## 2019-05-29 SURGICAL SUPPLY — 15 items

## 2019-05-29 NOTE — Progress Notes (Signed)
Physical Therapy Note  Patient Details  Name: Rebecca Lucas MRN: [id number] Date of Birth: [birth year] Today's Date: 05/29/2019    Attempted to see patient for scheduled therapy session. Pt declines participation 2/2 ongoing abdominal pain. Pt left sidelying in bed with needs in reach. Pt missed 45 min of therapy due to ongoing abdominal pain. Will continue per POC.   Excell Seltzer, PT, DPT  05/29/2019, 9:31 AM

## 2019-05-29 NOTE — Brief Op Note (Signed)
05/14/2019 - 05/29/2019  1:57 PM  PATIENT:  Rebecca Lucas  63 y.o. female  PRE-OPERATIVE DIAGNOSIS:  epigastric pain, ascites  POST-OPERATIVE DIAGNOSIS:  esophageal candida and multiple gastric ulcers  PROCEDURE:  Procedure(s): ESOPHAGOGASTRODUODENOSCOPY (EGD) WITH PROPOFOL (N/A) BIOPSY ESOPHAGEAL BRUSHING  SURGEON:  Surgeon(s) and Role:    Ronnette Juniper, MD - Primary  PHYSICIAN ASSISTANT:   ASSISTANTS: Maggie Chrismon, RN, Elspeth Cho, Tech  ANESTHESIA:   MAC  EBL:  Minimal  BLOOD ADMINISTERED:none  DRAINS: none   LOCAL MEDICATIONS USED:  NONE  SPECIMEN:  Biopsy / Limited Resection  DISPOSITION OF SPECIMEN:  PATHOLOGY  COUNTS:  YES  TOURNIQUET:  * No tourniquets in log *  DICTATION: .Dragon Dictation  PLAN OF CARE: Admit to inpatient   PATIENT DISPOSITION:  PACU - hemodynamically stable.   Delay start of Pharmacological VTE agent (>24hrs) due to surgical blood loss or risk of bleeding: no

## 2019-05-29 NOTE — Interval H&P Note (Signed)
History and Physical Interval Note: 63/female with metastatic lung cancer with epigastric and upper abdominal pain for an EGD. 05/29/2019 1:20 PM  Rebecca Lucas  has presented today for EGD, with the diagnosis of epigastric pain.  The various methods of treatment have been discussed with the patient and family. After consideration of risks, benefits and other options for treatment, the patient has consented to  Procedure(s): ESOPHAGOGASTRODUODENOSCOPY (EGD) WITH PROPOFOL (N/A) as a surgical intervention.  The patient's history has been reviewed, patient examined, no change in status, stable for surgery.  I have reviewed the patient's chart and labs.  Questions were answered to the patient's satisfaction.     Rebecca Lucas

## 2019-05-29 NOTE — Anesthesia Procedure Notes (Signed)
Procedure Name: MAC Date/Time: 05/29/2019 1:35 PM Performed by: Janeece Riggers, MD Pre-anesthesia Checklist: Patient identified, Emergency Drugs available, Suction available and Patient being monitored Patient Re-evaluated:Patient Re-evaluated prior to induction Oxygen Delivery Method: Nasal cannula Preoxygenation: Pre-oxygenation with 100% oxygen

## 2019-05-29 NOTE — Transfer of Care (Signed)
Immediate Anesthesia Transfer of Care Note  Patient: Rebecca Lucas  Procedure(s) Performed: ESOPHAGOGASTRODUODENOSCOPY (EGD) WITH PROPOFOL (N/A )  Patient Location: Endoscopy Unit  Anesthesia Type:MAC  Level of Consciousness: drowsy  Airway & Oxygen Therapy: Patient Spontanous Breathing  Post-op Assessment: Report given to RN  Post vital signs: Reviewed and stable  Last Vitals:  Vitals Value Taken Time  BP    Temp    Pulse 110 05/29/19 1346  Resp 25 05/29/19 1346  SpO2 98 % 05/29/19 1346  Vitals shown include unvalidated device data.  Last Pain:  Vitals:   05/29/19 1312  TempSrc: Temporal  PainSc: 7       Patients Stated Pain Goal: 2 (89/37/34 2876)  Complications: No apparent anesthesia complications

## 2019-05-29 NOTE — Progress Notes (Signed)
Occupational Therapy Session Note  Patient Details  Name: Rebecca Lucas MRN: [id number] Date of Birth: Aug 03, 1956  Today's Date: 05/29/2019 OT Missed Time: 103 Minutes Missed Time Reason: Other (comment)(Off unit procedure)   Short Term Goals: Week 2:  OT Short Term Goal 1 (Week 2): STG=LTG due to LOS  Pt off unit for procedure at time of scheduled tx session. Will attempt to make-up time as pt available/able.    Delainee Tramel L 05/29/2019, 7:12 AM

## 2019-05-29 NOTE — Anesthesia Preprocedure Evaluation (Addendum)
Anesthesia Evaluation  Patient identified by MRN, date of birth, ID band Patient awake    Reviewed: Allergy & Precautions, NPO status , Patient's Chart, lab work & pertinent test results  History of Anesthesia Complications Negative for: history of anesthetic complications  Airway Mallampati: II  TM Distance: >3 FB Neck ROM: Full    Dental no notable dental hx.    Pulmonary neg pulmonary ROS,  RUL lung ca widely metastatic to spine/bones/abdomen, on fentanyl patch and dexamethasone     Pulmonary exam normal breath sounds clear to auscultation       Cardiovascular negative cardio ROS Normal cardiovascular exam Rhythm:Regular Rate:Normal     Neuro/Psych negative neurological ROS  negative psych ROS   GI/Hepatic negative GI ROS, Neg liver ROS, GERD  Medicated,  Endo/Other  negative endocrine ROS  Renal/GU negative Renal ROS  negative genitourinary   Musculoskeletal negative musculoskeletal ROS (+)   Abdominal   Peds negative pediatric ROS (+)  Hematology negative hematology ROS (+) anemia , Hgb 10.3   Anesthesia Other Findings Day of surgery medications reviewed with patient.  Reproductive/Obstetrics negative OB ROS                            Anesthesia Physical Anesthesia Plan  ASA: III  Anesthesia Plan: MAC   Post-op Pain Management:    Induction:   PONV Risk Score and Plan: 2 and Treatment may vary due to age or medical condition and Propofol infusion  Airway Management Planned: Natural Airway and Nasal Cannula  Additional Equipment: None  Intra-op Plan:   Post-operative Plan:   Informed Consent:   Plan Discussed with:   Anesthesia Plan Comments:         Anesthesia Quick Evaluation

## 2019-05-29 NOTE — Consult Note (Signed)
Medical Consultation   Rebecca Lucas  HWE:[id number]  DOB: 01-11-1956  DOA: 05/14/2019  PCP: Caren Macadam, MD    Requesting physician: Dr. Dagoberto Ligas  Reason for consultation: Abdominal distention and pain since 3 days.  History of Present Illness: Rebecca Lucas is an 63 y.o. 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 Orson Slick.  She had acute blood loss anemia and she was subsequently transfused.  Patient was admitted for inpatient rehab and was doing well until this weekend.  Patient reports progressive abdominal pain and distention since 3 days associated with decreased appetite and nausea.  She had CT abdomen/pelvis on 05/27/2019 which showed interval development of small to moderate volume of ascites &  Gallbladder sludge.  GI was consulted who recommended ultrasound-guided paracentesis by IR, Protonix 40 mg IV twice daily, Carafate 1 g 3 times daily and EGD-however IR deferred the paracentesis as limited ultrasound did not show fluid.  Patient is planned for EGD today at 1:00.  Patient reports severe abdominal pain, more on the right side which radiates to her back, 10 out of 10, aggravates with eating, getting better with oxycodone.  As per husband at bedside who is also local MD reports that she was doing well until this weekend and declined abruptly in last  3 days.  No bowel movement since Saturday.  Reports passing gas on and off, denies melena or hematemesis, pruritus, yellow discoloration of his skin, change in color of stool or urine.  Has history of chronic constipation and is on Dulcolax suppository daily, Linzess 72 mcg before breakfast, MiraLAX and senna as needed at bedtime.  She had colonoscopy 7 years ago which was negative.  As per patient her abdominal distention is not progressive.  Dr. Dagoberto Ligas (physical medicine and rehab MD) consulted  [facility] for further evaluation and management of her abdominal distention and pain.  Review of Systems:  ROS As per HPI otherwise negative  Past Medical History: Past Medical History:  Diagnosis Date  . Acute blood loss anemia   . Cancer of upper lobe of right lung [facility])     Past Surgical History: Past Surgical History:  Procedure Laterality Date  . APPLICATION OF ROBOTIC ASSISTANCE FOR SPINAL PROCEDURE N/A 05/09/2019   Procedure: APPLICATION OF ROBOTIC ASSISTANCE FOR SPINAL PROCEDURE;  Surgeon: Consuella Lose, MD;  Location: [place];  Service: Neurosurgery;  Laterality: N/A;  APPLICATION OF ROBOTIC ASSISTANCE FOR SPINAL PROCEDURE  . IR IMAGING GUIDED PORT INSERTION  06/26/2018  . POSTERIOR LUMBAR FUSION 4 LEVEL N/A 05/09/2019   Procedure: Lumbar Two Laminectomy; Thoracic Twelve-Lumbar Four instrumented stabilization;  Surgeon: Consuella Lose, MD;  Location: [place];  Service: Neurosurgery;  Laterality: N/A;  Lumbar Two Laminectomy; Thoracic Twelve-Lumbar Four instrumented stabilization     Allergies:  No Known Allergies   Social History:  reports that she has never smoked. She has never used  smokeless tobacco. She reports current alcohol use. She reports that she does not use drugs.   Family History: Family History  Problem Relation Age of Onset  . ALS Mother 3       non genetic  . Cancer Neg Hx      Physical Exam: Vitals:   05/28/19 1340 05/28/19 1927 05/29/19  0500 05/29/19 0527  BP: (!) 161/92 (!) 162/97  (!) 144/83  Pulse: (!) 106 (!) 106  (!) 110  Resp: 17 18  18   Temp: 98.3 F (36.8 C) 99.4 F (37.4 C)  98.6 F (37 C)  TempSrc: Oral Oral    SpO2: 100% 100%  100%  Weight:   57.9 kg   Height:        Constitutional: Alert and awake, oriented x3, not in any acute distress.  Looks weak and lethargic Eyes: PERLA, EOMI, irises appear normal, anicteric sclera,  ENMT: external ears and nose appear normal,             Lips appears normal, oropharynx mucosa, tongue, posterior pharynx appear normal  Neck: neck appears normal, no masses, normal ROM, no thyromegaly, no JVD  CVS: S1-S2 clear, no murmur rubs or gallops, no LE edema, normal pedal pulses, PICC line noted on right side of upper chest. Respiratory:  clear to auscultation bilaterally, no wheezing, rales or rhonchi. Respiratory effort normal. No accessory muscle use.  Abdomen: Abdomen distended, umbilicus everted, soft, generalized tenderness positive more on right side.  No guarding no rigidity.  Bowel sounds positive, no hepatosplenomegaly noted. Musculoskeletal: : no cyanosis, clubbing or edema noted bilaterally                        Neuro: Power 5 out of 5 in bilateral upper extremity and right lower extremity, 1 out of 5 in left lower extremity.   Psych: judgement and insight appear normal, stable mood and affect, mental status Skin: no rashes or lesions or ulcers, no induration or nodules    Data reviewed:  I have personally reviewed following labs and imaging studies Labs:  CBC: Recent Labs  Lab 05/23/19 1626 05/24/19 0435 05/27/19 0420 05/29/19 0428  WBC 21.8* 18.9* 17.8* 11.6*  NEUTROABS 19.7* 16.5* 16.5* 10.9*  HGB 9.5* 9.6* 10.7* 10.3*  HCT 30.3* 31.1* 33.2* 29.6*  MCV 101.7* 102.6* 98.5 95.8  PLT 228 214 203 116*    Basic Metabolic Panel: Recent Labs  Lab 05/23/19 1626 05/27/19 0420 05/29/19 0428  NA 139 135 129*  K 4.5 4.2 3.5  CL 107 101 99  CO2 26 23 22    GLUCOSE 151* 93 119*  BUN 25* 18 12  CREATININE 0.63 0.57 0.43*  CALCIUM 8.4* 8.5* 7.5*   GFR Estimated Creatinine Clearance: 67.5 mL/min (A) (by C-G formula based on SCr of 0.43 mg/dL (L)). Liver Function Tests: Recent Labs  Lab 05/23/19 1626 05/27/19 0420 05/29/19 0428  AST 45* 39 35  ALT 73* 89* 61*  ALKPHOS 95 118 104  BILITOT 0.3 0.5 0.5  PROT 5.0* 5.7* 4.9*  ALBUMIN 2.3* 2.5* 2.1*   No results for input(s): LIPASE, AMYLASE in the last 168 hours. No results for input(s): AMMONIA in the last 168 hours. Coagulation profile No results for input(s): INR, PROTIME in the last 168 hours.  Cardiac Enzymes: No results for input(s): CKTOTAL, CKMB, CKMBINDEX, TROPONINI in the last 168 hours. BNP: Invalid input(s): POCBNP CBG: No results for input(s): GLUCAP in the last 168 hours. D-Dimer No results  for input(s): DDIMER in the last 72 hours. Hgb A1c No results for input(s): HGBA1C in the last 72 hours. Lipid Profile No results for input(s): CHOL, HDL, LDLCALC, TRIG, CHOLHDL, LDLDIRECT in the last 72 hours. Thyroid function studies No results for input(s): TSH, T4TOTAL, T3FREE, THYROIDAB in the last 72 hours.  Invalid input(s): FREET3 Anemia work up No results for input(s): VITAMINB12, FOLATE, FERRITIN, TIBC, IRON, RETICCTPCT in the last 72 hours. Urinalysis    Component Value Date/Time   COLORURINE YELLOW 05/25/2019 1555   APPEARANCEUR HAZY (A) 05/25/2019 1555   LABSPEC 1.020 05/25/2019 1555   PHURINE 5.0 05/25/2019 1555   GLUCOSEU NEGATIVE 05/25/2019 1555   HGBUR NEGATIVE 05/25/2019 1555   BILIRUBINUR NEGATIVE 05/25/2019 1555   KETONESUR NEGATIVE 05/25/2019 1555   PROTEINUR NEGATIVE 05/25/2019 1555   UROBILINOGEN 0.2 08/09/2010 1026   NITRITE NEGATIVE 05/25/2019 1555   LEUKOCYTESUR MODERATE (A) 05/25/2019 1555     Microbiology Recent Results (from the past 240 hour(s))  Urine Culture     Status: Abnormal   Collection Time: 05/25/19  1:28 PM   Specimen:  Urine, Catheterized  Result Value Ref Range Status   Specimen Description URINE, CATHETERIZED  Final   Special Requests   Final    NONE Performed at [facility], [place] [address] [place], [place] [address]    Culture >=100,000 COLONIES/mL ESCHERICHIA COLI (A)  Final   Report Status 05/27/2019 FINAL  Final   Organism ID, Bacteria ESCHERICHIA COLI (A)  Final      Susceptibility   Escherichia coli - MIC*    AMPICILLIN <=2 SENSITIVE Sensitive     CEFAZOLIN <=4 SENSITIVE Sensitive     CEFTRIAXONE <=1 SENSITIVE Sensitive     CIPROFLOXACIN <=0.25 SENSITIVE Sensitive     GENTAMICIN <=1 SENSITIVE Sensitive     IMIPENEM <=0.25 SENSITIVE Sensitive     NITROFURANTOIN <=16 SENSITIVE Sensitive     TRIMETH/SULFA <=20 SENSITIVE Sensitive     AMPICILLIN/SULBACTAM <=2 SENSITIVE Sensitive     PIP/TAZO <=4 SENSITIVE Sensitive     Extended ESBL NEGATIVE Sensitive     * >=100,000 COLONIES/mL ESCHERICHIA COLI  SARS Coronavirus 2 [facility] order, Performed in [facility] hospital lab) Nasopharyngeal Nasopharyngeal Swab     Status: None   Collection Time: 05/28/19  4:05 PM   Specimen: Nasopharyngeal Swab  Result Value Ref Range Status   SARS Coronavirus 2 NEGATIVE NEGATIVE Final    Comment: (NOTE) If result is NEGATIVE SARS-CoV-2 target nucleic acids are NOT DETECTED. The SARS-CoV-2 RNA is generally detectable in upper and lower  respiratory specimens during the acute phase of infection. The lowest  concentration of SARS-CoV-2 viral copies this assay can detect is 250  copies / mL. A negative result does not preclude SARS-CoV-2 infection  and should not be used as the sole basis for treatment or other  patient management decisions.  A negative result may occur with  improper specimen collection / handling, submission of specimen other  than nasopharyngeal swab, presence of viral mutation(s) within the  areas targeted by this assay, and inadequate number of viral copies  (<250 copies / mL). A  negative result must be combined with clinical  observations, patient history, and epidemiological information. If result is POSITIVE SARS-CoV-2 target nucleic acids are DETECTED. The SARS-CoV-2 RNA is generally detectable in upper and lower  respiratory specimens dur ing the acute phase of infection.  Positive  results are indicative of active infection with SARS-CoV-2.  Clinical  correlation with patient  history and other diagnostic information is  necessary to determine patient infection status.  Positive results do  not rule out bacterial infection or co-infection with other viruses. If result is PRESUMPTIVE POSTIVE SARS-CoV-2 nucleic acids MAY BE PRESENT.   A presumptive positive result was obtained on the submitted specimen  and confirmed on repeat testing.  While 2019 novel coronavirus  (SARS-CoV-2) nucleic acids may be present in the submitted sample  additional confirmatory testing may be necessary for epidemiological  and / or clinical management purposes  to differentiate between  SARS-CoV-2 and other Sarbecovirus currently known to infect humans.  If clinically indicated additional testing with an alternate test  methodology [phone]) is advised. The SARS-CoV-2 RNA is generally  detectable in upper and lower respiratory sp ecimens during the acute  phase of infection. The expected result is Negative. Fact Sheet for Patients:  StrictlyIdeas.no Fact Sheet for Healthcare Providers: BankingDealers.co.za This test is not yet approved or cleared by the [place] FDA and has been authorized for detection and/or diagnosis of SARS-CoV-2 by FDA under an Emergency Use Authorization (EUA).  This EUA will remain in effect (meaning this test can be used) for the duration of the COVID-19 declaration under Section 564(b)(1) of the Act, 21 U.S.C. section 360bbb-3(b)(1), unless the authorization is terminated or revoked sooner. Performed  at [facility], [place] [address], [place], [place] [address]        Inpatient Medications:   Scheduled Meds: . acetaminophen  650 mg Oral Q6H  . bisacodyl  10 mg Rectal Daily  . cephALEXin  250 mg Oral Q8H  . Chlorhexidine Gluconate Cloth  6 each Topical Q0600  . dexamethasone  3 mg Oral Daily  . enoxaparin (LOVENOX) injection  40 mg Subcutaneous Q24H  . fentaNYL  1 patch Transdermal Q72H  . gabapentin  300 mg Oral TID  . linaclotide  72 mcg Oral QAC breakfast  . pantoprazole (PROTONIX) IV  40 mg Intravenous Q12H  . potassium chloride  40 mEq Oral Once  . sucralfate  1 g Oral TID WC & HS   Continuous Infusions: . sodium chloride 0.9 %       Radiological Exams on Admission: Ct Abdomen Pelvis Wo Contrast  Result Date: 05/27/2019 CLINICAL DATA:  Abdominal pain. Gastroenteritis or colitis suspected. History of metastatic lung cancer. EXAM: CT ABDOMEN AND PELVIS WITHOUT CONTRAST TECHNIQUE: Multidetector CT imaging of the abdomen and pelvis was performed following the standard protocol without IV contrast. COMPARISON:  02/19/2019. FINDINGS: Lower chest: Small bilateral pleural effusions. Hepatobiliary: No focal liver abnormality identified. There is mild pericholecystic fluid. There is sludge identified layering within the gallbladder. No stones noted. No bile duct dilatation. Pancreas: Unremarkable. No pancreatic ductal dilatation or surrounding inflammatory changes. Spleen: Normal in size without focal abnormality. Adrenals/Urinary Tract: Normal appearance of the adrenal glands. No kidney mass or hydronephrosis identified bilaterally. There is moderate distension of the urinary bladder without focal abnormality. Stomach/Bowel: The stomach is normal. Within the limitations of unenhanced technique there is no small bowel wall thickening, inflammation or distension. The appendix is visualized and appears normal. No colonic wall thickening, inflammation or distension. Vascular/Lymphatic:  Aortic atherosclerosis without aneurysm. Similar appearance prevertebral soft tissue thickening and retroperitoneal fat stranding associated with the L2 compression deformity. Asymmetric soft tissue thickening of the right crus of diaphragm which is contiguous with the soft tissue thickening around L2 is again noted. No retroperitoneal adenopathy. No pelvic or inguinal adenopathy. Reproductive: Uterus and bilateral adnexa are unremarkable. Other:  New small to moderate volume of ascites identified within the abdomen and pelvis. Musculoskeletal: Status post posterior hardware fixation at T12 through L4. Laminectomy at L2 is again noted. Similar appearance of pathologic compression deformity involving the L2 vertebra. Similar appearance of metastasis to L1, L2 and L3 vertebra. Unchanged right sacral wing metastasis. IMPRESSION: 1. Interval development of small to moderate volume of ascites within the abdomen and pelvis. Etiology indeterminate. 2. L1 through L3 vertebral metastasis with pathologic fracture involving the L2 vertebra. Abnormal increased prevertebral soft tissue and retroperitoneal soft tissue stranding is again noted which appears similar to the CT of the lumbar spine from 05/08/2019. Associated asymmetric soft tissue thickening of the right crus of diaphragm is similar to 05/08/19 but new from CT AP from 02/19/2019. This area is incompletely characterized without IV contrast material. 3. Gallbladder sludge. Electronically Signed   By: Kerby Moors M.D.   On: 05/27/2019 14:20   Ir Abdomen US Limited  Result Date: 05/28/2019 CLINICAL DATA:  63 year old female with abdominal distension. She presents for possible paracentesis. EXAM: LIMITED ABDOMEN ULTRASOUND FOR ASCITES TECHNIQUE: Limited ultrasound survey for ascites was performed in all four abdominal quadrants. COMPARISON:  CT abdomen/pelvis 05/27/2019 FINDINGS: Ultrasound was used to interrogate the 4 quadrants of the abdomen. No drainable ascites  is visualized. IMPRESSION: No ascites visualized by ultrasound. Paracentesis was deferred. Electronically Signed   By: Jacqulynn Cadet M.D.   On: 05/28/2019 17:17    Impression/Recommendations Principal Problem:   Cauda equina syndrome (HCC) Active Problems:   Foot drop, left   Neurogenic bowel   Bandemia   Steroid-induced hyperglycemia   Leucocytosis   Acute blood loss anemia   Postoperative pain   Generalized Abdominal Pain & Distention: -Unknown Etiology- IR tried to perform paracentesis however paracentesis was deferred as Limited US revealed No fluid. -CT abdomen/pelvis from 05/27/2019 revealed mild to moderate ascites and sludge in gallbladder.  X-ray KUB on 9/14 showed no acute findings -Patient continues to have severe pain, distention associated with bloating, nausea and decreased appetite -GI is on board-appreciate help -Patient is n.p.o. for EGD today at 1:00 pm. -On Zofran as needed for nausea and vomiting. -Continue Protonix 40 mg IV twice daily, Carafate 1 g 3 times daily as per GI recommendation -Continue oxycodone, fentanyl patch as needed for pain -We will get x-ray KUB, lipase level, acute hepatitis panel & await EGD result.  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 -Supposed to restart chemo on 05/30/2019 outpatient. -PT is on board  Leukocytosis: -Likely secondary to steroid use -WBC count is trending down patient is afebrile. -WBC count: 24.6 on  9/7, 11.6 on  9/16  Chronic Constipation: -Secondary to metastatic spinal tumor versus pain medication -Continue Dulcolax suppository 10 mg daily, MiraLAX and Linzess  UTI: -Urine culture came back  positive for E. Coli-continue Keflex (Day#4)  Acute blood loss anemia: -Patient received 3 units PRBC 05/08/2019 -H&H is stable-no signs of bleeding.  Thank you for this consultation.  Our [facility] hospitalist team will follow the patient with you.   Time Spent: 35 minutes.  Mckinley Jewel M.D. [facility] 05/29/2019, 12:56 PM Pager [phone]   If 7PM-7AM, please contact night-coverage [email or web address] Password [email or web address]

## 2019-05-29 NOTE — Patient Care Conference (Signed)
Inpatient RehabilitationTeam Conference and Plan of Care Update Date: 05/29/2019   Time: 9:50 AM    Patient Name: Rebecca Lucas      Medical Record Number: [id number]  Date of Birth: 1955-10-14 Sex: Female         Room/Bed: 4W09C/4W09C-01 Payor Info: Payor: [employer] / Plan:  [employer] / Product Type: *No Product type* /    Admit Date/Time:  05/14/2019  3:58 PM  Primary Diagnosis:  Abdominal pain  Patient Active Problem List   Diagnosis Date Noted  . Abdominal pain 05/29/2019  . Abdominal distension   . Steroid-induced hyperglycemia   . Leucocytosis   . Acute blood loss anemia   . Postoperative pain   . Bandemia   . Foot drop, left   . Neurogenic bowel   . Cauda equina syndrome ([place]) 05/14/2019  . Urinary retention   . Surgery, elective   . 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 Acute urinary retention 05/07/2019  . Elevated LFTs 05/07/2019  . 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 Oral thrush 04/04/2018  . Goals of care, counseling/discussion 04/04/2018  . Encounter for antineoplastic chemotherapy 04/04/2018  . Encounter for antineoplastic immunotherapy 04/04/2018  . 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 Metastatic malignant neoplasm ([place]) 03/06/2018  . Primary cancer of right upper lobe of lung ([place]) 03/06/2018  . Bone metastasis ([place]) 03/06/2018    Expected Discharge Date: Expected Discharge Date: (TBD - medical)  Team Members Present: Physician leading conference: Dr. Courtney Heys Social Worker Present: Lennart Pall, LCSW Nurse Present: Dorthula Nettles, RN PT Present: Excell Seltzer, PT OT Present: [name] Rounds, OT SLP Present: Stormy Fabian, SLP PPS Coordinator present : Gunnar Fusi, SLP     Current Status/Progress Goal Weekly Team Focus  Bowel/Bladder   Continent B/B. LBM 9/13  Remains continent of B/B  Assist with toileting needs    Swallow/Nutrition/ Hydration             ADL's   Prior to medical set back, pt close supervision-CGA overall. Following acute set back, pt now max-total A overall for ADLs and mod A squat pivot transfers 2/2 pain and gneralized weakness  Supervision overall  ADL re-training, functional strengthening/endurance, neuro re-ed, d/c planning   Mobility   1 assist stand pivot to W/C and to [place] with transfers  Balancing/gait   Communication             Safety/Cognition/ Behavioral Observations  A/O X4. Calls appropriately.  No falls or injury with transfers  Assist with care and transfers   Pain   Pt is in constant pain. Oxy q4hrs and Tylenol scheduled q6hrs  Pain level of </=3 on pain scale of 1 - 10  Assess for pain q 4 hrs   Skin   Surgical incision to the back, with ABD drsg. Sutures already have been taken out  Surgical incision free from infection.  Assess for redness, drainage, warmth, odor q shift    Rehab Goals Patient on target to meet rehab goals: No Rehab Goals Revised: functional decline past couple of days due to medical/ pain issues - anticipate need to extend LOS *See Care Plan and progress notes for long and short-term goals.     Barriers to Discharge  Current Status/Progress Possible Resolutions Date Resolved   Nursing  PT                    OT                  SLP                SW                Discharge Planning/Teaching Needs:  Pt to d/c home with spouse able to provide 24/7 assistance.  Teaching to be planned closer to d/c.   Team Discussion:  New onset abdominal pain a few days ago - medical work up underway still to determine cause/ treatment.  Plan for endoscopy today.  Oncology aware and will postponed planned chemo for tomorrow.  CIR d/c now postponed as well.  Pt with functional decline with onset of pain.  Had been on target to meet original goals, however, now requiring more physical assistance and tolerance for  activity is limited.  Await completion of medical work up to determine goals and new target d/c date.  Revisions to Treatment Plan:  See above    Medical Summary Current Status: pt having severe abd pain; GI doing endoscopy; IM consulted Weekly Focus/Goal: get pt home after controlling IM issues  Barriers to Discharge: Home enviroment access/layout;Neurogenic Bowel & Bladder;Medical stability;Other (comments)  Barriers to Discharge Comments: pain Possible Resolutions to Barriers: IM consult called   Continued Need for Acute Rehabilitation Level of Care: The patient requires daily medical management by a physician with specialized training in physical medicine and rehabilitation for the following reasons: Direction of a multidisciplinary physical rehabilitation program to maximize functional independence : Yes Medical management of patient stability for increased activity during participation in an intensive rehabilitation regime.: Yes Analysis of laboratory values and/or radiology reports with any subsequent need for medication adjustment and/or medical intervention. : Yes   I attest that I was present, lead the team conference, and concur with the assessment and plan of the team.   Lennart Pall 05/29/2019, 2:32 PM     Team conference was held via web/ teleconference due to [place] - 19

## 2019-05-29 NOTE — Op Note (Signed)
EGD was performed for severe epigastric and upper abdominal pain.   Findings: Whitish patches noted throughout the esophagus suspicious for candidiasis, cytology brushing obtained. Multiple nonbleeding cratered, linear and superficial gastric ulcers, some with pigmented material and some with clean bases found in the gastric fundus, body, greater curvature of the stomach, lesser curvature of the stomach, incisura and gastric antrum, biopsies taken. Erythematous gastric antrum, biopsies taken for H. Pylori. Normal-appearing duodenum and duodenal bulb.   Recommendations: Advance diet as tolerated. PPI twice daily for 8 weeks and then at least once a day as long as patient needs NSAIDs and steroid. Diflucan 100 mg p.o. daily for 7 days. We will follow-up biopsy and cytology report.  Ronnette Juniper, MD

## 2019-05-29 NOTE — Progress Notes (Signed)
Social Work Patient ID: Rebecca Lucas, female   DOB: 1956/04/13, 64 y.o.   MRN: [id number]  Spoke with pt's spouse this afternoon about pt's status in regards to d/c.  He understands why/ how medical issues have affected what was to have been her original d/c date today.  He is concerned about insurance coverage but have just confirmed that insurance did approve another 7 additional days if needed.  Will touch base with team tomorrow and follow up with pt and spouse once further work up information is available.  Kelise Kuch, LCSW

## 2019-05-29 NOTE — Anesthesia Postprocedure Evaluation (Signed)
Anesthesia Post Note  Patient: Rebecca Lucas  Procedure(s) Performed: ESOPHAGOGASTRODUODENOSCOPY (EGD) WITH PROPOFOL (N/A ) BIOPSY ESOPHAGEAL BRUSHING     Patient location during evaluation: PACU Anesthesia Type: MAC Level of consciousness: awake and alert Pain management: pain level controlled Vital Signs Assessment: post-procedure vital signs reviewed and stable Respiratory status: spontaneous breathing, nonlabored ventilation, respiratory function stable and patient connected to nasal cannula oxygen Cardiovascular status: stable and blood pressure returned to baseline Postop Assessment: no apparent nausea or vomiting Anesthetic complications: no    Last Vitals:  Vitals:   05/29/19 1355 05/29/19 1405  BP: 138/60 (!) 144/61  Pulse: (!) 107 (!) 105  Resp: 16 15  Temp:    SpO2: 96% 97%    Last Pain:  Vitals:   05/29/19 1405  TempSrc:   PainSc: 7                  Ladislao Cohenour

## 2019-05-29 NOTE — Progress Notes (Addendum)
[facility] PHYSICAL MEDICINE & REHABILITATION PROGRESS NOTE   Subjective/Complaints:  Pt reports still having abd pain- severe- and associated nausea- per chart, hasn't gotten Zofran prn this AM?  Pain is now RLQ, not epigastric only- admits it's the whole abdomen, but most at RLQ.  Spoke to husband/pt later in AM for 47 minutes- going over scenarios of what might be occurring- bottom line- pt/hsband don't want to put "eggs all in 1 basket" with endoscopy- and are concerned she's not acting like herself- and feels so bad.     ROS: Denies CP, shortness of breath, (+) nausea, abd pain (+)  Objective:   Ct Abdomen Pelvis Wo Contrast  Result Date: 05/27/2019 CLINICAL DATA:  Abdominal pain. Gastroenteritis or colitis suspected. History of metastatic lung cancer. EXAM: CT ABDOMEN AND PELVIS WITHOUT CONTRAST TECHNIQUE: Multidetector CT imaging of the abdomen and pelvis was performed following the standard protocol without IV contrast. COMPARISON:  02/19/2019. FINDINGS: Lower chest: Small bilateral pleural effusions. Hepatobiliary: No focal liver abnormality identified. There is mild pericholecystic fluid. There is sludge identified layering within the gallbladder. No stones noted. No bile duct dilatation. Pancreas: Unremarkable. No pancreatic ductal dilatation or surrounding inflammatory changes. Spleen: Normal in size without focal abnormality. Adrenals/Urinary Tract: Normal appearance of the adrenal glands. No kidney mass or hydronephrosis identified bilaterally. There is moderate distension of the urinary bladder without focal abnormality. Stomach/Bowel: The stomach is normal. Within the limitations of unenhanced technique there is no small bowel wall thickening, inflammation or distension. The appendix is visualized and appears normal. No colonic wall thickening, inflammation or distension. Vascular/Lymphatic: Aortic atherosclerosis without aneurysm. Similar appearance prevertebral soft tissue  thickening and retroperitoneal fat stranding associated with the L2 compression deformity. Asymmetric soft tissue thickening of the right crus of diaphragm which is contiguous with the soft tissue thickening around L2 is again noted. No retroperitoneal adenopathy. No pelvic or inguinal adenopathy. Reproductive: Uterus and bilateral adnexa are unremarkable. Other: New small to moderate volume of ascites identified within the abdomen and pelvis. Musculoskeletal: Status post posterior hardware fixation at T12 through L4. Laminectomy at L2 is again noted. Similar appearance of pathologic compression deformity involving the L2 vertebra. Similar appearance of metastasis to L1, L2 and L3 vertebra. Unchanged right sacral wing metastasis. IMPRESSION: 1. Interval development of small to moderate volume of ascites within the abdomen and pelvis. Etiology indeterminate. 2. L1 through L3 vertebral metastasis with pathologic fracture involving the L2 vertebra. Abnormal increased prevertebral soft tissue and retroperitoneal soft tissue stranding is again noted which appears similar to the CT of the lumbar spine from 05/08/2019. Associated asymmetric soft tissue thickening of the right crus of diaphragm is similar to 05/08/19 but new from CT AP from 02/19/2019. This area is incompletely characterized without IV contrast material. 3. Gallbladder sludge. Electronically Signed   By: Kerby Moors M.D.   On: 05/27/2019 14:20   Ir Abdomen US Limited  Result Date: 05/28/2019 CLINICAL DATA:  63 year old female with abdominal distension. She presents for possible paracentesis. EXAM: LIMITED ABDOMEN ULTRASOUND FOR ASCITES TECHNIQUE: Limited ultrasound survey for ascites was performed in all four abdominal quadrants. COMPARISON:  CT abdomen/pelvis 05/27/2019 FINDINGS: Ultrasound was used to interrogate the 4 quadrants of the abdomen. No drainable ascites is visualized. IMPRESSION: No ascites visualized by ultrasound. Paracentesis was  deferred. Electronically Signed   By: Jacqulynn Cadet M.D.   On: 05/28/2019 17:17   Recent Labs    05/27/19 0420 05/29/19 0428  WBC 17.8* 11.6*  HGB 10.7* 10.3*  HCT  33.2* 29.6*  PLT 203 116*   Recent Labs    05/27/19 0420 05/29/19 0428  NA 135 129*  K 4.2 3.5  CL 101 99  CO2 23 22  GLUCOSE 93 119*  BUN 18 12  CREATININE 0.57 0.43*  CALCIUM 8.5* 7.5*    Intake/Output Summary (Last 24 hours) at 05/29/2019 1158 Last data filed at 05/29/2019 0757 Gross per 24 hour  Intake 1045.31 ml  Output -  Net 1045.31 ml     Physical Exam: Vital Signs Blood pressure (!) 144/83, pulse (!) 110, temperature 98.6 F (37 C), resp. rate 18, height 5\' 7"  (1.702 m), weight 57.9 kg, SpO2 100 %. Constitutional: No distress . Vital signs reviewed. In room in bed;laying on L side, then back second visit (husband there 2nd visit), doesn't want to move at all, poor/pale color,,  wearing headscarf, stable round facies from steroids, NAD HENT: Normocephalic.  Atraumatic. Eyes: EOMI. No discharge. Cardiovascular: RRR Respiratory: Normal effort.  No stridor. CTA b/l, no wheezing no rales or rhonchi GI: firmer abd, not hard fluid wave slightly?, TTP RLQ and epigastric; no rebound; hypoactive BS (Is NPO), a little more distended Skin: Warm and dry.  Intact. Incision healing/scabbing- sutures out- no drainage/no erythema- took dressing off Psych: very quiet- only answered questions, didn't ask or offer up info- c/o being in pain Musc: No edema or tenderness in extremities. Neuro: Alert Motor: Bilateral upper extremities: 5/5 proximal distal Right lower extremity: 4+/5 proximal distal Left lower extremity: Ankle plantarflexion 3+/5, otherwise 0/5 Sensation diminished to light touch left lower extremity   Assessment/Plan: 1. 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  Physiatrist is providing close team supervision and 24 hour management of active medical problems listed below.  Physiatrist and rehab team continue to assess barriers to discharge/monitor patient progress toward functional and medical goals  Care Tool:  Bathing  Bathing activity did not occur: Refused Body parts bathed by patient: Right arm, Left arm, Chest, Abdomen, Front perineal area, Right upper leg, Left upper leg, Face, Buttocks, Right lower leg, Left lower leg   Body parts bathed by helper: Buttocks, Right lower leg, Left lower leg     Bathing assist Assist Level: Set up assist     Upper Body Dressing/Undressing Upper body dressing   What is the patient wearing?: Pull over shirt, Orthosis Orthosis activity level: Performed by patient  Upper body assist Assist Level: Set up assist    Lower Body Dressing/Undressing Lower body dressing      What is the patient wearing?: Underwear/pull up, Pants     Lower body assist Assist for lower body dressing: Minimal Assistance - Patient > 75%     Toileting Toileting Toileting Activity did not occur Landscape architect and hygiene only): Refused  Toileting assist Assist for toileting: Total Assistance - Patient < 25%     Transfers Chair/bed transfer  Transfers assist     Chair/bed transfer assist level: Minimal Assistance - Patient > 75%     Locomotion Ambulation   Ambulation assist      Assist level: Contact Guard/Touching assist Assistive device: Walker-rolling Max distance: 45'   Walk 10 feet activity   Assist     Assist level: Contact Guard/Touching assist Assistive device: Walker-rolling   Walk 50 feet activity   Assist Walk 50 feet  with 2 turns activity did not occur: Safety/medical concerns         Walk 150 feet activity   Assist Walk 150 feet activity did not occur: Safety/medical concerns          Walk 10 feet on uneven surface  activity   Assist Walk 10 feet on uneven surfaces activity did not occur: Safety/medical concerns         Wheelchair     Assist Will patient use wheelchair at discharge?: Yes Type of Wheelchair: Manual Wheelchair activity did not occur: Safety/medical concerns  Wheelchair assist level: Independent Max wheelchair distance: 150    Wheelchair 50 feet with 2 turns activity    Assist    Wheelchair 50 feet with 2 turns activity did not occur: Safety/medical concerns   Assist Level: Independent   Wheelchair 150 feet activity     Assist   CBG (last 3)  No results for input(s): GLUCAP in the last 72 hours.   Wheelchair 150 feet activity did not occur: Safety/medical concerns   Assist Level: Independent   Blood pressure (!) 144/83, pulse (!) 110, temperature 98.6 F (37 C), resp. rate 18, height 5\' 7"  (1.702 m), weight 57.9 kg, SpO2 100 %.  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  Continue CIR -Back brace when out of bed. -Plan is to follow-up outpatient Dr. Earlie Server in regards to chemotherapy  9/6- Chemo to start 05/30/2019, not 9/24, like originally planned  9/9- will see when sutures can come out- tomorrow is 14 days, however, on steroids.  9/10- will remove Sutures on Monday 9/14 2. Antithrombotics: -DVT/anticoagulation:SCDs.LE venous dopplers negative for DVT   9/10- pt agreed to  Lovenox -antiplatelet therapy: N/A 3. Pain Management:Neurontin 300 mg 3 times daily, Duragesic patch 75 mcg,  Advil 400 mg 3 times daily oxycodone as needed  Ibuprofen changed to PRN on 9/5  Relatively controlled with medication on 9/7  9/16- oxy is q4 hours prn  4. Mood:motivational support -antipsychotic agents: N/A 5. Neuropsych: This patientiscapable of making decisions on herown behalf. 6. Skin/Wound Care:Routine skin checks -honeycomb dressing in place currently, plan to DC sutures soon  Continue foam dressing to left lower extremity  9/8- will d/c honeycomb dressing  9/9- will see when sutures to be removed  9/14- will remove sutures today  9/16- looks good- no erythema 7. Fluids/Electrolytes/Nutrition:Routine in and outs  BMP within acceptable range on 9/7  9/16- Na 129- slightly low- on IVFs NS 75cc/hr- changed GI order; and will give KCl x1 when gets back- to replete K+ of 3.5. Calcium lower, will monitor 8. Acute blood loss anemia.   Hemoglobin 10.0 on 9/7   Continue to monitor 9. Leukocytosis secondary to steroids. Continue to monitor  WBCs 24.6 on 9/7  Afebrile  Continue to monitor  9/11- WBC down to 18.9  9/16- WBC is 11.6- con't to monitor 10. Urinary retention:  Has fatigue and foul-smelling urine in the setting of elevated WBCs, UA positive await culture, start Keflex 250 mg 3 times daily until cultures are back  9/14- pansensitive- will con't ABX 11. Constipation in setting of Neurogenic bowel and opioid-induced constipation. Linzess daily. Adjust bowel program as needed  Bowel program with dig stim at night/evening.   Encouraged  pt to take  Linzess; can hold miralax if she wants.  Miralax prn  Appears to be improving  9/14- pt asked for suppository to be d/c'd since doing so well- KUB (-) of note 12.  Steroid-induced hyperglycemia  Continue to monitor 13. Severe abd pain  9/14- will check CT of abd/pelvis to make sure nothing causing Abd pain- KUB (-)  9/15- CT showed ascites and gallbladder sludge- GI consulted- ordered U/S  guided paracentesis- GI ordered associated labs; will order AM labs as well; started protonix 40 mg IV BID and sucralfate 1 g QID and NPO for possible endoscopy in AM.   9/16- endoscopy at 1pm- wasn't able to get paracentesis- not enough fluid- will call IM to get another set of eyes, per husband request.  14. Dispo  9/8- will try to arrange to deactivate her central port x 1 day so can take a shower.  9/10- will re-access today after shower; d/c date 9/16- and starts Chemo 9/17- father will be home with her at home.  9/11- will start trazodone 50 mg QHS prn for sleep  9/15- will not d/c 9/16- have let Dr Earlie Server know what's going on.   9/16- made sure nursing gave Zofran and will encourage pain med use.   I spent a total of 55 minutes on pt care today- 47 minutes with husband, calling IM. LOS: 15 days A FACE TO FACE EVALUATION WAS PERFORMED  Adele Milson 05/29/2019, 11:58 AM

## 2019-05-29 NOTE — Progress Notes (Signed)
NS at 71ml/hr started for pre-procedure. Pt has been requesting for pain med q4hrs for constant pain. Reports some relief after administration of oxy. No respiratory distress is noted. Will continue to monitor pt closely.

## 2019-05-30 ENCOUNTER — Inpatient Hospital Stay (HOSPITAL_COMMUNITY): Payer: 59 | Admitting: Physical Therapy

## 2019-05-30 ENCOUNTER — Inpatient Hospital Stay: Payer: 59

## 2019-05-30 ENCOUNTER — Other Ambulatory Visit: Payer: 59

## 2019-05-30 ENCOUNTER — Inpatient Hospital Stay (HOSPITAL_COMMUNITY): Payer: 59 | Admitting: Occupational Therapy

## 2019-05-30 ENCOUNTER — Inpatient Hospital Stay (HOSPITAL_COMMUNITY): Payer: 59

## 2019-05-30 ENCOUNTER — Inpatient Hospital Stay: Payer: 59 | Admitting: Physician Assistant

## 2019-05-30 ENCOUNTER — Encounter (HOSPITAL_COMMUNITY): Payer: Self-pay | Admitting: Gastroenterology

## 2019-05-30 DIAGNOSIS — K5903 Drug induced constipation: Secondary | ICD-10-CM

## 2019-05-30 DIAGNOSIS — R1084 Generalized abdominal pain: Secondary | ICD-10-CM

## 2019-05-30 DIAGNOSIS — R740 Nonspecific elevation of levels of transaminase and lactic acid dehydrogenase [LDH]: Secondary | ICD-10-CM

## 2019-05-30 DIAGNOSIS — G893 Neoplasm related pain (acute) (chronic): Secondary | ICD-10-CM | POA: Diagnosis present

## 2019-05-30 DIAGNOSIS — R140 Abdominal distension (gaseous): Secondary | ICD-10-CM

## 2019-05-30 DIAGNOSIS — N390 Urinary tract infection, site not specified: Secondary | ICD-10-CM | POA: Diagnosis not present

## 2019-05-30 DIAGNOSIS — R188 Other ascites: Secondary | ICD-10-CM

## 2019-05-30 DIAGNOSIS — E871 Hypo-osmolality and hyponatremia: Secondary | ICD-10-CM

## 2019-05-30 DIAGNOSIS — R7401 Elevation of levels of liver transaminase levels: Secondary | ICD-10-CM | POA: Diagnosis present

## 2019-05-30 DIAGNOSIS — B370 Candidal stomatitis: Secondary | ICD-10-CM | POA: Diagnosis not present

## 2019-05-30 DIAGNOSIS — T402X5A Adverse effect of other opioids, initial encounter: Secondary | ICD-10-CM | POA: Diagnosis present

## 2019-05-30 LAB — CBC WITH DIFFERENTIAL/PLATELET
Abs Immature Granulocytes: 0.35 10*3/uL — ABNORMAL HIGH (ref 0.00–0.07)
Basophils Absolute: 0 10*3/uL (ref 0.0–0.1)
Basophils Relative: 0 %
Eosinophils Absolute: 0 10*3/uL (ref 0.0–0.5)
Eosinophils Relative: 0 %
HCT: 30.2 % — ABNORMAL LOW (ref 36.0–46.0)
Hemoglobin: 10 g/dL — ABNORMAL LOW (ref 12.0–15.0)
Immature Granulocytes: 3 %
Lymphocytes Relative: 1 %
Lymphs Abs: 0.1 10*3/uL — ABNORMAL LOW (ref 0.7–4.0)
MCH: 31.8 pg (ref 26.0–34.0)
MCHC: 33.1 g/dL (ref 30.0–36.0)
MCV: 96.2 fL (ref 80.0–100.0)
Monocytes Absolute: 0.3 10*3/uL (ref 0.1–1.0)
Monocytes Relative: 3 %
Neutro Abs: 10.1 10*3/uL — ABNORMAL HIGH (ref 1.7–7.7)
Neutrophils Relative %: 93 %
Platelets: 107 10*3/uL — ABNORMAL LOW (ref 150–400)
RBC: 3.14 MIL/uL — ABNORMAL LOW (ref 3.87–5.11)
RDW: 15.9 % — ABNORMAL HIGH (ref 11.5–15.5)
WBC: 10.8 10*3/uL — ABNORMAL HIGH (ref 4.0–10.5)
nRBC: 0 % (ref 0.0–0.2)

## 2019-05-30 LAB — COMPREHENSIVE METABOLIC PANEL
ALT: 74 U/L — ABNORMAL HIGH (ref 0–44)
AST: 55 U/L — ABNORMAL HIGH (ref 15–41)
Albumin: 2 g/dL — ABNORMAL LOW (ref 3.5–5.0)
Alkaline Phosphatase: 151 U/L — ABNORMAL HIGH (ref 38–126)
Anion gap: 7 (ref 5–15)
BUN: 12 mg/dL (ref 8–23)
CO2: 22 mmol/L (ref 22–32)
Calcium: 7.2 mg/dL — ABNORMAL LOW (ref 8.9–10.3)
Chloride: 101 mmol/L (ref 98–111)
Creatinine, Ser: 0.37 mg/dL — ABNORMAL LOW (ref 0.44–1.00)
GFR calc Af Amer: >60 mL/min (ref >60)
GFR calc non Af Amer: >60 mL/min (ref >60)
Glucose, Bld: 99 mg/dL (ref 70–99)
Potassium: 3.7 mmol/L (ref 3.5–5.1)
Sodium: 130 mmol/L — ABNORMAL LOW (ref 135–145)
Total Bilirubin: 0.4 mg/dL (ref 0.3–1.2)
Total Protein: 4.7 g/dL — ABNORMAL LOW (ref 6.5–8.1)

## 2019-05-30 LAB — HEPATITIS PANEL, ACUTE
HCV Ab: <0.1 s/co ratio (ref 0.0–0.9)
Hep A IgM: NEGATIVE
Hep B C IgM: NEGATIVE
Hepatitis B Surface Ag: NEGATIVE

## 2019-05-30 LAB — HIV ANTIBODY (ROUTINE TESTING W REFLEX): HIV Screen 4th Generation wRfx: NONREACTIVE

## 2019-05-30 MED ORDER — SORBITOL 70 % SOLN
30.0000 mL | Freq: Once | Status: AC
  Administered 2019-05-30: 13:00:00 30 mL via ORAL
  Filled 2019-05-30: qty 30

## 2019-05-30 MED ORDER — NITROFURANTOIN MONOHYD MACRO 100 MG PO CAPS
100.0000 mg | ORAL_CAPSULE | Freq: Two times a day (BID) | ORAL | Status: DC
  Administered 2019-05-30 – 2019-06-02 (×7): 100 mg via ORAL
  Filled 2019-05-30 (×7): qty 1

## 2019-05-30 MED ORDER — NALOXEGOL OXALATE 25 MG PO TABS
25.0000 mg | ORAL_TABLET | Freq: Every day | ORAL | Status: DC
  Administered 2019-05-30 – 2019-05-31 (×2): 25 mg via ORAL
  Filled 2019-05-30 (×2): qty 1

## 2019-05-30 MED ORDER — SENNOSIDES-DOCUSATE SODIUM 8.6-50 MG PO TABS
1.0000 | ORAL_TABLET | Freq: Two times a day (BID) | ORAL | Status: DC
  Administered 2019-05-30 – 2019-06-01 (×5): 1 via ORAL
  Filled 2019-05-30 (×6): qty 1

## 2019-05-30 MED ORDER — LIDOCAINE 5 % EX PTCH
1.0000 | MEDICATED_PATCH | CUTANEOUS | Status: DC
  Administered 2019-05-30 – 2019-06-07 (×9): 1 via TRANSDERMAL
  Filled 2019-05-30 (×9): qty 1

## 2019-05-30 NOTE — Progress Notes (Signed)
Follow-up KUB today shows suspect focal ileus.  Discussed with patient and husband who is at bedside.  At this time will downgrade diet to full liquids and monitor until a.m.  Husband had noted patient with severe constipation in the past needing disimpaction.  At this time will downgrade diet monitor continue to adjust bowel program as needed

## 2019-05-30 NOTE — Progress Notes (Signed)
Recreational Therapy Discharge Summary Patient Details  Name: PERCILLA TWETEN MRN: [id number] Date of Birth: November 23, 1955 Today's Date: 05/30/2019  Long term goals set: 1  Long term goals met: Goal discharge/deferred as pt has had a change in medical status.  Comments on progress toward goals: Pt is being discharged form TR services due to a change in medical status and unable to work toward community skills goal.  Assessment session included discussing activity analysis with potential modifications and energy conservation.  Reasons for discharge: change in medical status Patient/family agrees with progress made and goals achieved: Yes  Chellie Vanlue 05/30/2019, 8:27 AM

## 2019-05-30 NOTE — Progress Notes (Signed)
*  Late Entry*  Patient arrived back from Endo. Pt resting in bed with husband at bedside. VS taken and documented. New orders received and acknowledged. Pt to received scheduled medications at 1500 along with pain medication. IV fluids restarted with NS @ 25m/hr. Pt requesting full liquid diet at this time and understands diet to be advanced as tolerated. All questions answered to pt and husband satisfaction. Pt resting at this time and all needs met.

## 2019-05-30 NOTE — Progress Notes (Signed)
Occupational Therapy Session Note  Patient Details  Name: Rebecca Lucas MRN: [id number] Date of Birth: [birth year]  Today's Date: 05/30/2019 OT Missed Time: 13 Minutes Missed Time Reason: Patient fatigue   Short Term Goals: Week 2:  OT Short Term Goal 1 (Week 2): STG=LTG due to LOS  Pt declining therapy at this time 2/2 fatigue and wishing to stay resting comfortably in bed. Declined OOB or bed level activity at this time. Will attempt to make up time as pt able. RN aware.    Jamahl Lemmons L 05/30/2019, 6:52 AM

## 2019-05-30 NOTE — Progress Notes (Signed)
MEWs Score 2, yellow, with HR 120bpm, checked manually. Pt reports pain back and abdominal pain. Pain medication given. Pt is alert and oriented. Dr. Posey Pronto made aware. Yellow MEWs guidelines being followed. Continue plan of care.   Gerald Stabs, RN

## 2019-05-30 NOTE — Progress Notes (Signed)
[place] PHYSICAL MEDICINE & REHABILITATION PROGRESS NOTE   Subjective/Complaints: Patient seen laying in bed this morning.  She states she slept fairly overnight due to abdominal discomfort.  She states her last bowel movement was on Saturday.  Later seen, examined, and discussed with hospitalist.  She also asks for an increase in pain medications.  She was seen by GI yesterday and underwent EGD, notes reviewed.  ROS: + Abdominal pain, back pain.  Denies CP, shortness of breath  Objective:   Dg Abd 1 View  Result Date: 05/29/2019 CLINICAL DATA:  Abdominal distension, history of ulcer EXAM: ABDOMEN - 1 VIEW COMPARISON:  Radiograph 05/27/2019, CT abdomen pelvis 05/27/2019 FINDINGS: The bowel gas pattern is normal. Evaluation for free intraperitoneal air is limited on this supine only exam. Postsurgical changes from prior T12-L4 thoracolumbar fusion. Irregular sclerosis of the upper lumbar levels compatible with osseous metastatic disease seen on most recent comparison CT. Stable appearance of the pathologic fracture of L2 when compared to prior. Radio-opaque calculi or other significant radiographic abnormality are seen. IMPRESSION: Nonobstructive bowel gas pattern. Known osseous metastatic disease is better seen on prior CT. Unchanged appearance of the L2 pathologic compression deformity. Electronically Signed   By: Lovena Le M.D.   On: 05/29/2019 20:07   Ir Abdomen US Limited  Result Date: 05/28/2019 CLINICAL DATA:  63 year old female with abdominal distension. She presents for possible paracentesis. EXAM: LIMITED ABDOMEN ULTRASOUND FOR ASCITES TECHNIQUE: Limited ultrasound survey for ascites was performed in all four abdominal quadrants. COMPARISON:  CT abdomen/pelvis 05/27/2019 FINDINGS: Ultrasound was used to interrogate the 4 quadrants of the abdomen. No drainable ascites is visualized. IMPRESSION: No ascites visualized by ultrasound. Paracentesis was deferred. Electronically Signed   By:  Jacqulynn Cadet M.D.   On: 05/28/2019 17:17   Recent Labs    05/29/19 0428 05/30/19 0433  WBC 11.6* 10.8*  HGB 10.3* 10.0*  HCT 29.6* 30.2*  PLT 116* 107*   Recent Labs    05/29/19 0428 05/30/19 0433  NA 129* 130*  K 3.5 3.7  CL 99 101  CO2 22 22  GLUCOSE 119* 99  BUN 12 12  CREATININE 0.43* 0.37*  CALCIUM 7.5* 7.2*    Intake/Output Summary (Last 24 hours) at 05/30/2019 1224 Last data filed at 05/29/2019 1859 Gross per 24 hour  Intake 418.3 ml  Output -  Net 418.3 ml     Physical Exam: Vital Signs Blood pressure 135/76, pulse (!) 120, temperature 98.6 F (37 C), resp. rate 18, height 5\' 7"  (1.702 m), weight 57.9 kg, SpO2 96 %. Constitutional: No distress . Vital signs reviewed. HENT: Normocephalic.  Atraumatic. Eyes: Keeps eyes closed no discharge. Cardiovascular: No JVD. Respiratory: Normal effort.  No stridor. GI: Distended.  Bowel sounds normal. Skin: Warm and dry.  Intact. Psych: Flat. Musc: No edema in extremities.  No tenderness in extremities. Neuro: Alert Motor: Bilateral upper extremities: 5/5 proximal distal Right lower extremity: 3+/5 proximal distal (?  Effort) Left lower extremity: Ankle plantarflexion 3+/5, otherwise 0/5, unchanged Sensation diminished to light touch left lower extremity, unchanged  Assessment/Plan: 1. 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  Physiatrist is providing close team supervision and 24 hour management of active medical problems listed below.  Physiatrist and rehab team continue to assess barriers to discharge/monitor patient progress toward functional and medical  goals  Care Tool:  Bathing  Bathing activity did not occur: Refused Body parts bathed by patient: Right arm, Left arm, Chest, Abdomen,  Front perineal area, Right upper leg, Left upper leg, Face, Buttocks, Right lower leg, Left lower leg   Body parts bathed by helper: Buttocks, Right lower leg, Left lower leg     Bathing assist Assist Level: Set up assist     Upper Body Dressing/Undressing Upper body dressing   What is the patient wearing?: Pull over shirt, Orthosis Orthosis activity level: Performed by patient  Upper body assist Assist Level: Set up assist    Lower Body Dressing/Undressing Lower body dressing      What is the patient wearing?: Underwear/pull up, Pants     Lower body assist Assist for lower body dressing: Minimal Assistance - Patient > 75%     Toileting Toileting Toileting Activity did not occur Landscape architect and hygiene only): Refused  Toileting assist Assist for toileting: Total Assistance - Patient < 25%     Transfers Chair/bed transfer  Transfers assist     Chair/bed transfer assist level: Minimal Assistance - Patient > 75%     Locomotion Ambulation   Ambulation assist      Assist level: Contact Guard/Touching assist Assistive device: Walker-rolling Max distance: 45'   Walk 10 feet activity   Assist     Assist level: Contact Guard/Touching assist Assistive device: Walker-rolling   Walk 50 feet activity   Assist Walk 50 feet with 2 turns activity did not occur: Safety/medical concerns         Walk 150 feet activity   Assist Walk 150 feet activity did not occur: Safety/medical concerns         Walk 10 feet on uneven surface  activity   Assist Walk 10 feet on uneven surfaces activity did not occur: Safety/medical concerns         Wheelchair     Assist Will patient use wheelchair at discharge?: Yes Type of Wheelchair: Manual Wheelchair activity did not occur: Safety/medical concerns  Wheelchair assist level: Independent Max wheelchair distance: 150    Wheelchair 50 feet with 2 turns activity    Assist    Wheelchair 50  feet with 2 turns activity did not occur: Safety/medical concerns   Assist Level: Independent   Wheelchair 150 feet activity     Assist    Wheelchair 150 feet activity did not occur: Safety/medical concerns   Assist Level: Independent   Medical Problem List and Plan: 1.Decreased functional mobilitysecondary to 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   Continue CIR -Back brace when out of bed. -Plan is to follow-up outpatient Dr. Earlie Server in regards to chemotherapy  Chemotherapy was to begin on 9/17, however now on hold  2. Antithrombotics: -DVT/anticoagulation:SCDs. Lovenox  LE venous dopplers negative for DVT -antiplatelet therapy: N/A 3. Pain Management:  Neurontin 300 mg 3 times daily  Duragesic patch 75 mcg, Advil 400 mg  Ibuprofen changed to PRN on 9/5, DC'd on 9/17 due to EGD findings and discussion with hospitalist  9/16- oxy is q4 hours prn   Lidoderm patch added on 9/17 to back 4. Mood:motivational support -antipsychotic agents: N/A 5. Neuropsych: This patientiscapable of making decisions on herown behalf. 6. Skin/Wound Care:Routine skin checks 7. Fluids/Electrolytes/Nutrition:Routine in and outs 8. Acute  blood loss anemia.   Hemoglobin 10.0 on 9/7   Continue to monitor 9. Leukocytosis secondary to steroids. Continue to monitor  WBCs 10.8 on 9/17  Afebrile  Continue to monitor 10. Urinary retention/acute lower UTI:  Keflex, started on 9/13, changed to [place] on 9/17 11. Constipation in setting of Neurogenic bowel and opioid-induced constipation. Adjust bowel program as needed  Bowel program with  dig stim at night/evening.   Encouraged pt to take  Linzess; can hold miralax if she wants.  Miralax prn  Appears to be improving  KUB personally reviewed, relatively unremarkable for abdomen, will order repeat today  Last recorded bowel movement on 9/13 12.  Steroid-induced hyperglycemia  Continue to monitor  Relatively controlled on 9/17 13. Severe abd pain  CT showed ascites and gallbladder sludge- GI consulted- ordered U/S guided paracentesis- GI ordered associated labs    EGD performed by GI, appreciate recs  PPI twice daily x8 weeks then daily as long as patient requires steroids  Await cytology results  Hospitalist following, appreciate recs  Discussed with Hospitalist potential contributers, abx changes, linzess changed to movantik, await follow up KUB to determine potentially benefit of decompression 14.  Sleep disturbance  Trazodone 50 nightly as needed  15.  Hyponatremia  Sodium 130 on 9/17  Continue to monitor 16.  Transaminitis  LFTs elevated on 9/17  Continue to monitor 17.  Oral candidiasis  Noted on EGD  Diflucan 100 mg daily x7 days per GI 18.  Tachycardia  Likely secondary to above  LOS: 16 days A FACE TO FACE EVALUATION WAS PERFORMED  Rebecca Lucas Lorie Phenix 05/30/2019, 12:24 PM

## 2019-05-30 NOTE — Progress Notes (Signed)
PROGRESS NOTE    Rebecca Lucas  KKX:[id number] DOB: 10/16/1955 DOA: 05/14/2019 PCP: Caren Macadam, MD    Brief Narrative:  Rebecca Lucas is an 63 y.o. 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 Orson Slick.  She had acute blood loss anemia and she was subsequently transfused.  Patient was admitted for inpatient rehab and was doing well until this weekend.  Patient reports progressive abdominal pain and distention since 3 days associated with decreased appetite and nausea.  She had CT abdomen/pelvis on 05/27/2019 which showed interval development of small to moderate volume of ascites &  Gallbladder sludge.  GI was consulted who recommended ultrasound-guided paracentesis by IR, Protonix 40 mg IV twice daily, Carafate 1 g 3 times daily and EGD-however IR deferred the paracentesis as limited ultrasound did not show fluid.   Patient reports severe abdominal pain, more on the right side which radiates to her back, 10 out of 10, aggravates with eating, getting better with oxycodone.  No bowel movement since Saturday.  Reports passing gas on and off, denies melena or hematemesis, pruritus, yellow discoloration of his skin, change in color of stool or urine.  Has history of chronic constipation and is on Dulcolax suppository daily, Linzess 72 mcg before breakfast, MiraLAX and senna as needed  at bedtime.  She had colonoscopy 7 years ago which was negative.  As per patient her abdominal distention is not progressive.  Dr. Dagoberto Ligas (physical medicine and rehab MD) consulted  [facility] hospitalist for further evaluation and management of her abdominal distention and pain.  She underwent EGD yesterday (05/29/2019) and per report was noted to have whitish patches throughout the esophagus suspicious for candidiasis, cytology brushing obtained.  Multiple nonbleeding cratered, linear and superficial gastric ulcers some with pigmented material and some with clean bases found in the gastric fundus, body, greater curvature of the stomach, lesser curvature of the stomach incisura and gastric antrum, biopsies taken. Erythematous gastric antrum, biopsies taken for H. Pylori. Normal-appearing duodenum and duodenal bulb.  GI recommended PPI twice daily for 8 weeks followed by once a day as long as patient needs any NSAIDs and steroid.  Also started her on Diflucan 100 mg p.o. daily for 7 days.   Assessment & Plan:   Principal Problem:   Abdominal pain Active Problems:   Cauda equina syndrome (HCC)   Foot drop, left   Neurogenic bowel   Bandemia   Steroid-induced hyperglycemia   Leucocytosis   Acute blood loss anemia   Postoperative pain   Abdominal distension   Generalized Abdominal Pain & Distention: -Unknown Etiology- IR tried to perform paracentesis however paracentesis was deferred as Limited US revealed No fluid. -CT abdomen/pelvis from 05/27/2019 revealed mild to moderate ascites and sludge in gallbladder.  X-ray KUB on 9/14 showed no acute findings -Patient continues to complain of abdominal pain, distention associated with bloating, nausea and decreased appetite -GI consulted, patient underwent EGD yesterday with findings as mentioned above showing gastric  ulceration with white patches throughout the esophagus. -Continue Protonix 40 mg twice daily, Carafate 1 g 3 times, Carafate daily as  per GI recommendation -Continue oxycodone, fentanyl patch as needed for pain.  Avoid NSAIDs if possible. -Lipase level normal.  Acute hepatitis panel negative.  KUB showing nonobstructive bowel gas pattern.  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 -Supposed to restart chemo on 05/30/2019 outpatient. -PT is on board/ -She has weakness with debility left greater than right.  Leukocytosis: -Likely secondary to steroid use -WBC count is trending down patient is afebrile. -WBC count: 24.6 on  9/7, 11.6 on  9/16; 10.8 on 05/30/2019.  Chronic Constipation: -Secondary to metastatic spinal tumor versus pain medication -Continue Dulcolax suppository 10 mg daily, MiraLAX and Linzess  UTI: -Urine culture came back positive for E. Coli- Keflex (4 days)  Acute blood loss anemia: -Patient received 3 units PRBC 05/08/2019 -H&H is stable-no signs of bleeding.  Thrombocytopenia: -Patient noted to have low platelet count and lab work today.  Exact etiology unclear at this time. -Ordered labs to be monitored tomorrow.  She is on subcutaneous Lovenox for DVT prophylaxis. If platelet count tomorrow still low then we may need to order HIT panel and further work-up to evaluate. - Discussed with rehab attending Dr. Posey Pronto.   Subjective: Continues to complain of abdominal pain.  Also has distention, nausea but no vomiting.  Denies having any diarrhea.  Objective: Vitals:   05/29/19 1438 05/29/19 2017 05/30/19 0838 05/30/19 1033  BP: (!) 142/74 140/78 133/88 132/74  Pulse: (!) 104 (!) 123 (!) 120 (!) 121  Resp: 16 18    Temp: 99.2 F (37.3 C) 99.5 F (37.5 C)  98.1 F (36.7 C)    TempSrc: Oral Oral  Oral  SpO2: 100% 100% 100% 100%  Weight:      Height:        Intake/Output Summary (Last 24 hours) at 05/30/2019 1155 Last data filed at 05/29/2019 1859 Gross per 24 hour  Intake 418.3 ml  Output --  Net 418.3 ml   Filed Weights   05/26/19 0452 05/27/19 0414 05/29/19 0500  Weight: 60.4 kg 56 kg 57.9 kg    Examination:  General exam: Appears with generalized weakness, awake, oriented.  Not in any acute distress at this time. Respiratory system: Decreased breath sounds lower lobes otherwise clear to auscultation. Respiratory effort normal. Cardiovascular system: S1 & S2, no murmur. No pedal edema. Gastrointestinal system: Abdomen distended, generalized tenderness without any guarding or rebound.  Normal bowel sounds heard. Central nervous system: Alert and oriented.  She has generalized weakness, worse in lower extremities left greater than right. Skin: No rashes, lesions or ulcers Psychiatry: Judgement and insight appear normal. Mood & affect appropriate.    Data Reviewed: I have personally reviewed following labs and imaging studies  CBC: Recent Labs  Lab 05/23/19 1626 05/24/19 0435 05/27/19 0420 05/29/19 0428 05/30/19 0433  WBC 21.8* 18.9* 17.8* 11.6* 10.8*  NEUTROABS 19.7* 16.5* 16.5* 10.9* 10.1*  HGB 9.5* 9.6* 10.7* 10.3* 10.0*  HCT 30.3* 31.1* 33.2* 29.6* 30.2*  MCV 101.7* 102.6* 98.5 95.8 96.2  PLT 228 214 203 116* 539*   Basic Metabolic Panel: Recent Labs  Lab 05/23/19 1626 05/27/19 0420 05/29/19 0428 05/30/19 0433  NA 139 135 129* 130*  K 4.5 4.2 3.5 3.7  CL 107 101 99 101  CO2 26 23 22 22   GLUCOSE 151* 93 119* 99  BUN 25* 18 12 12   CREATININE 0.63 0.57 0.43* 0.37*  CALCIUM 8.4* 8.5* 7.5* 7.2*   GFR: Estimated Creatinine Clearance: 67.5 mL/min (A) (by C-G formula based on SCr of 0.37 mg/dL (L)). Liver Function Tests: Recent Labs  Lab 05/23/19 1626 05/27/19 0420 05/29/19 0428 05/30/19 0433  AST 45* 39 35 55*  ALT 73*  89* 61* 74*  ALKPHOS 95 118 104 151*  BILITOT 0.3 0.5 0.5 0.4  PROT 5.0* 5.7* 4.9* 4.7*  ALBUMIN 2.3* 2.5* 2.1* 2.0*   Recent Labs  Lab 05/29/19 1559  LIPASE 36   No results for input(s): AMMONIA in the last 168 hours. Coagulation Profile: No results for input(s): INR, PROTIME in the last 168 hours. Cardiac Enzymes: No results for input(s): CKTOTAL, CKMB, CKMBINDEX, TROPONINI in the last 168 hours. BNP (last 3 results) No results for input(s): PROBNP in the last 8760 hours. HbA1C: No results for input(s): HGBA1C in the last 72 hours. CBG: No results for input(s): GLUCAP in the last 168 hours. Lipid Profile: No results for input(s): CHOL, HDL, LDLCALC, TRIG, CHOLHDL, LDLDIRECT in the last 72 hours. Thyroid Function Tests: No results for input(s): TSH, T4TOTAL, FREET4, T3FREE, THYROIDAB in the last 72 hours. Anemia Panel: No results for input(s): VITAMINB12, FOLATE, FERRITIN, TIBC, IRON, RETICCTPCT in the last 72 hours. Sepsis Labs: No results for input(s): PROCALCITON, LATICACIDVEN in the last 168 hours.  Recent Results (from the past 240 hour(s))  Urine Culture     Status: Abnormal   Collection Time: 05/25/19  1:28 PM   Specimen: Urine, Catheterized  Result Value Ref Range Status   Specimen Description URINE, CATHETERIZED  Final   Special Requests   Final    NONE Performed at [facility], [address] [place], [place] [address]    Culture >=100,000 COLONIES/mL ESCHERICHIA COLI (A)  Final   Report Status 05/27/2019 FINAL  Final   Organism ID, Bacteria ESCHERICHIA COLI (A)  Final      Susceptibility   Escherichia coli - MIC*    AMPICILLIN <=2 SENSITIVE Sensitive     CEFAZOLIN <=4 SENSITIVE Sensitive     CEFTRIAXONE <=1 SENSITIVE Sensitive     CIPROFLOXACIN <=0.25 SENSITIVE Sensitive     GENTAMICIN <=1 SENSITIVE Sensitive     IMIPENEM <=0.25 SENSITIVE Sensitive     NITROFURANTOIN <=16 SENSITIVE Sensitive     TRIMETH/SULFA <=20 SENSITIVE Sensitive      AMPICILLIN/SULBACTAM <=2 SENSITIVE Sensitive     PIP/TAZO <=4 SENSITIVE Sensitive     Extended ESBL NEGATIVE Sensitive     * >=100,000 COLONIES/mL ESCHERICHIA COLI  SARS Coronavirus 2 [facility] order, Performed in [facility] hospital lab) Nasopharyngeal Nasopharyngeal Swab     Status: None   Collection Time: 05/28/19  4:05 PM   Specimen: Nasopharyngeal Swab  Result Value Ref Range Status   SARS Coronavirus 2 NEGATIVE NEGATIVE Final    Comment: (NOTE) If result is NEGATIVE SARS-CoV-2 target nucleic acids are NOT DETECTED. The SARS-CoV-2 RNA is generally detectable in upper and lower  respiratory specimens during the acute phase of infection. The lowest  concentration of SARS-CoV-2 viral copies this assay can detect is 250  copies / mL. A negative result does not preclude SARS-CoV-2 infection  and should not be used as the sole basis for treatment or other  patient management decisions.  A negative result  may occur with  improper specimen collection / handling, submission of specimen other  than nasopharyngeal swab, presence of viral mutation(s) within the  areas targeted by this assay, and inadequate number of viral copies  (<250 copies / mL). A negative result must be combined with clinical  observations, patient history, and epidemiological information. If result is POSITIVE SARS-CoV-2 target nucleic acids are DETECTED. The SARS-CoV-2 RNA is generally detectable in upper and lower  respiratory specimens dur ing the acute phase of infection.  Positive  results are indicative of active infection with SARS-CoV-2.  Clinical  correlation with patient history and other diagnostic information is  necessary to determine patient infection status.  Positive results do  not rule out bacterial infection or co-infection with other viruses. If result is PRESUMPTIVE POSTIVE SARS-CoV-2 nucleic acids MAY BE PRESENT.   A presumptive positive result was obtained on the submitted specimen  and  confirmed on repeat testing.  While 2019 novel coronavirus  (SARS-CoV-2) nucleic acids may be present in the submitted sample  additional confirmatory testing may be necessary for epidemiological  and / or clinical management purposes  to differentiate between  SARS-CoV-2 and other Sarbecovirus currently known to infect humans.  If clinically indicated additional testing with an alternate test  methodology [phone]) is advised. The SARS-CoV-2 RNA is generally  detectable in upper and lower respiratory sp ecimens during the acute  phase of infection. The expected result is Negative. Fact Sheet for Patients:  StrictlyIdeas.no Fact Sheet for Healthcare Providers: BankingDealers.co.za This test is not yet approved or cleared by the [place] FDA and has been authorized for detection and/or diagnosis of SARS-CoV-2 by FDA under an Emergency Use Authorization (EUA).  This EUA will remain in effect (meaning this test can be used) for the duration of the COVID-19 declaration under Section 564(b)(1) of the Act, 21 U.S.C. section 360bbb-3(b)(1), unless the authorization is terminated or revoked sooner. Performed at [facility], [place] [address] [place], [place] [address]          Radiology Studies: Dg Abd 1 View  Result Date: 05/29/2019 CLINICAL DATA:  Abdominal distension, history of ulcer EXAM: ABDOMEN - 1 VIEW COMPARISON:  Radiograph 05/27/2019, CT abdomen pelvis 05/27/2019 FINDINGS: The bowel gas pattern is normal. Evaluation for free intraperitoneal air is limited on this supine only exam. Postsurgical changes from prior T12-L4 thoracolumbar fusion. Irregular sclerosis of the upper lumbar levels compatible with osseous metastatic disease seen on most recent comparison CT. Stable appearance of the pathologic fracture of L2 when compared to prior. Radio-opaque calculi or other significant radiographic abnormality are seen. IMPRESSION:  Nonobstructive bowel gas pattern. Known osseous metastatic disease is better seen on prior CT. Unchanged appearance of the L2 pathologic compression deformity. Electronically Signed   By: Lovena Le M.D.   On: 05/29/2019 20:07   Ir Abdomen US Limited  Result Date: 05/28/2019 CLINICAL DATA:  63 year old female with abdominal distension. She presents for possible paracentesis. EXAM: LIMITED ABDOMEN ULTRASOUND FOR ASCITES TECHNIQUE: Limited ultrasound survey for ascites was performed in all four abdominal quadrants. COMPARISON:  CT abdomen/pelvis 05/27/2019 FINDINGS: Ultrasound was used to interrogate the 4 quadrants of the abdomen. No drainable ascites is visualized. IMPRESSION: No ascites visualized by ultrasound. Paracentesis was deferred. Electronically Signed   By: Jacqulynn Cadet M.D.   On: 05/28/2019 17:17        Scheduled Meds:  acetaminophen  650 mg Oral Q6H   bisacodyl  10 mg Rectal Daily   calcium-vitamin D  Oral BID   cephALEXin  250 mg Oral Q8H   Chlorhexidine Gluconate Cloth  6 each Topical Q0600   dexamethasone  3 mg Oral Daily   enoxaparin (LOVENOX) injection  40 mg Subcutaneous Q24H   fentaNYL  1 patch Transdermal Q72H   fluconazole  100 mg Oral Daily   gabapentin  300 mg Oral TID   linaclotide  72 mcg Oral QAC breakfast   pantoprazole  40 mg Oral BID   polyethylene glycol  17 g Oral BID   potassium chloride  40 mEq Oral Once   sucralfate  1 g Oral TID WC & HS   Continuous Infusions:  sodium chloride 75 mL/hr at 05/30/19 0153     LOS: 16 days    Yaakov Guthrie, MD [facility] Pager on [place]  If 7PM-7AM, please contact night-coverage [email or web address] Password [email or web address] 05/30/2019, 11:55 AM

## 2019-05-30 NOTE — Progress Notes (Signed)
Physical Therapy Session Note  Patient Details  Name: Rebecca Lucas MRN: [id number] Date of Birth: [birth year]  Today's Date: 05/30/2019 PT Individual Time: 1100-1143 PT Individual Time Calculation (min): 43 min   Short Term Goals: Week 2:  PT Short Term Goal 1 (Week 2): =LTG due to ELOS  Skilled Therapeutic Interventions/Progress Updates:   Pt in bed upon arrival, agreeable to session with encouragement. Spouse present and supportive throughout session. TE: ROM LLE dorsiflexion, hip/knee flexion, abd/add. Rt LE strengthening: DF with endrange stretch, active hip flexion, resisted hip/knee extension, active abd/add, SAQ, QS each 1x10. TFA: Rolling min assist with cues for logroll technique. Side<>sitting EOB mod-max assist with c/o abdominal pain. Repeating X3 with decreasing sitting tolerance time. Able to don/doff back brace with total assist. Pt reports limited tolerance for sitting due to abdominal pain and fatigue. Unable to tolerate attempt at standing. Encouraged pt to work on exs in bed and increasing mobility as tolerated. Following session, pt in bed on Lt side with pillows supporting. Bed alarm on and all needs in reach.   Therapy Documentation Precautions:  Precautions Precautions: Fall, Back Precaution Comments: reviewed spinal precautions Required Braces or Orthoses: Spinal Brace Spinal Brace: Lumbar corset, Applied in sitting position Restrictions Weight Bearing Restrictions: No  Pain: Pain Assessment Pain Scale: 0-10 Pain Score: 8  Pain Type: Acute pain Pain Location: Abdomen Pain Orientation: Mid;Lower Pain Descriptors / Indicators: Aching Pain Frequency: Constant Pain Onset: On-going Patients Stated Pain Goal: 4 Pain Intervention(s): Medication (See eMAR)(tramadol) Multiple Pain Sites: Yes   Therapy/Group: Individual Therapy  Linard Millers 05/30/2019, 4:24 PM

## 2019-05-31 ENCOUNTER — Other Ambulatory Visit: Payer: Self-pay

## 2019-05-31 ENCOUNTER — Inpatient Hospital Stay (HOSPITAL_COMMUNITY): Payer: 59 | Admitting: Occupational Therapy

## 2019-05-31 ENCOUNTER — Inpatient Hospital Stay (HOSPITAL_COMMUNITY): Payer: 59 | Admitting: Physical Therapy

## 2019-05-31 ENCOUNTER — Inpatient Hospital Stay (HOSPITAL_COMMUNITY): Payer: 59

## 2019-05-31 DIAGNOSIS — E876 Hypokalemia: Secondary | ICD-10-CM | POA: Diagnosis not present

## 2019-05-31 DIAGNOSIS — R000 Tachycardia, unspecified: Secondary | ICD-10-CM | POA: Diagnosis present

## 2019-05-31 LAB — CBC
HCT: 27.9 % — ABNORMAL LOW (ref 36.0–46.0)
Hemoglobin: 9.1 g/dL — ABNORMAL LOW (ref 12.0–15.0)
MCH: 31.8 pg (ref 26.0–34.0)
MCHC: 32.6 g/dL (ref 30.0–36.0)
MCV: 97.6 fL (ref 80.0–100.0)
Platelets: 106 10*3/uL — ABNORMAL LOW (ref 150–400)
RBC: 2.86 MIL/uL — ABNORMAL LOW (ref 3.87–5.11)
RDW: 16.5 % — ABNORMAL HIGH (ref 11.5–15.5)
WBC: 8.7 10*3/uL (ref 4.0–10.5)
nRBC: 0 % (ref 0.0–0.2)

## 2019-05-31 LAB — COMPREHENSIVE METABOLIC PANEL
ALT: 78 U/L — ABNORMAL HIGH (ref 0–44)
AST: 53 U/L — ABNORMAL HIGH (ref 15–41)
Albumin: 2 g/dL — ABNORMAL LOW (ref 3.5–5.0)
Alkaline Phosphatase: 172 U/L — ABNORMAL HIGH (ref 38–126)
Anion gap: 7 (ref 5–15)
BUN: 16 mg/dL (ref 8–23)
CO2: 20 mmol/L — ABNORMAL LOW (ref 22–32)
Calcium: 7.4 mg/dL — ABNORMAL LOW (ref 8.9–10.3)
Chloride: 105 mmol/L (ref 98–111)
Creatinine, Ser: 0.48 mg/dL (ref 0.44–1.00)
GFR calc Af Amer: >60 mL/min (ref >60)
GFR calc non Af Amer: >60 mL/min (ref >60)
Glucose, Bld: 101 mg/dL — ABNORMAL HIGH (ref 70–99)
Potassium: 3.4 mmol/L — ABNORMAL LOW (ref 3.5–5.1)
Sodium: 132 mmol/L — ABNORMAL LOW (ref 135–145)
Total Bilirubin: 0.5 mg/dL (ref 0.3–1.2)
Total Protein: 4.6 g/dL — ABNORMAL LOW (ref 6.5–8.1)

## 2019-05-31 LAB — CYTOLOGY - NON PAP

## 2019-05-31 LAB — TROPONIN I (HIGH SENSITIVITY)
Troponin I (High Sensitivity): 17 ng/L (ref <18)
Troponin I (High Sensitivity): 15 ng/L (ref <18)

## 2019-05-31 MED ORDER — METOPROLOL TARTRATE 12.5 MG HALF TABLET
12.5000 mg | ORAL_TABLET | Freq: Two times a day (BID) | ORAL | Status: DC
  Administered 2019-05-31 – 2019-06-07 (×14): 12.5 mg via ORAL
  Filled 2019-05-31 (×15): qty 1

## 2019-05-31 MED ORDER — POTASSIUM CHLORIDE CRYS ER 20 MEQ PO TBCR
20.0000 meq | EXTENDED_RELEASE_TABLET | Freq: Every day | ORAL | Status: DC
  Administered 2019-05-31 – 2019-06-01 (×2): 20 meq via ORAL
  Filled 2019-05-31 (×2): qty 1

## 2019-05-31 MED ORDER — NALOXEGOL OXALATE 12.5 MG PO TABS
12.5000 mg | ORAL_TABLET | Freq: Every day | ORAL | Status: DC
  Filled 2019-05-31: qty 1

## 2019-05-31 NOTE — Plan of Care (Signed)
Pt's plan of care adjusted to 15/7 after speaking with care team and discussed with MD in team conference as pt currently unable to tolerate current therapy schedule with OT,and PT.

## 2019-05-31 NOTE — Progress Notes (Addendum)
[facility] PHYSICAL MEDICINE & REHABILITATION PROGRESS NOTE   Subjective/Complaints: Patient seen sitting up at the edge of her bed working with therapy this morning.  She states he slept better overnight.  She states that she feels better.  She notes she had 2 bowel movements overnight.  Notes reviewed, patient noted to be tachycardic overnight and hospitalist consulted.  EKG and troponins ordered.  ROS: + Abdominal pain, back pain, both improved.  Denies CP, shortness of breath  Objective:   Dg Abd 1 View  Result Date: 05/31/2019 CLINICAL DATA:  Abdominal pain. EXAM: ABDOMEN - 1 VIEW COMPARISON:  05/30/2019. FINDINGS: Previously identified dilated bowel loops over the right abdomen has improved. No prominent bowel distention on today's exam. No free air. Bladder distention cannot be excluded. Prior lumbar spine fusion. Hardware intact. No acute bony abnormality IMPRESSION: 1. Previously identified dilated bowel and has improved. No prominent bowel distention on today's exam. 2.  Bladder distention cannot be excluded. Electronically Signed   By: Marcello Moores  Register   On: 05/31/2019 07:28   Dg Abd 1 View  Result Date: 05/30/2019 CLINICAL DATA:  Acute generalized abdominal pain. EXAM: ABDOMEN - 1 VIEW COMPARISON:  Radiographs of May 29, 2019. CT scan of May 27, 2019. FINDINGS: Probable dilated large or small bowel loop is noted in right lower quadrant, most consistent with focal ileus. No abnormal calcifications are noted. Postsurgical changes are seen involving the lumbar spine. IMPRESSION: Probable dilated large or small bowel loop is noted in right lower quadrant, most consistent with focal ileus. Electronically Signed   By: Marijo Conception M.D.   On: 05/30/2019 13:38   Dg Abd 1 View  Result Date: 05/29/2019 CLINICAL DATA:  Abdominal distension, history of ulcer EXAM: ABDOMEN - 1 VIEW COMPARISON:  Radiograph 05/27/2019, CT abdomen pelvis 05/27/2019 FINDINGS: The bowel gas pattern is  normal. Evaluation for free intraperitoneal air is limited on this supine only exam. Postsurgical changes from prior T12-L4 thoracolumbar fusion. Irregular sclerosis of the upper lumbar levels compatible with osseous metastatic disease seen on most recent comparison CT. Stable appearance of the pathologic fracture of L2 when compared to prior. Radio-opaque calculi or other significant radiographic abnormality are seen. IMPRESSION: Nonobstructive bowel gas pattern. Known osseous metastatic disease is better seen on prior CT. Unchanged appearance of the L2 pathologic compression deformity. Electronically Signed   By: Lovena Le M.D.   On: 05/29/2019 20:07   Recent Labs    05/30/19 0433 05/31/19 0455  WBC 10.8* 8.7  HGB 10.0* 9.1*  HCT 30.2* 27.9*  PLT 107* 106*   Recent Labs    05/30/19 0433 05/31/19 0455  NA 130* 132*  K 3.7 3.4*  CL 101 105  CO2 22 20*  GLUCOSE 99 101*  BUN 12 16  CREATININE 0.37* 0.48  CALCIUM 7.2* 7.4*    Intake/Output Summary (Last 24 hours) at 05/31/2019 1033 Last data filed at 05/31/2019 0700 Gross per 24 hour  Intake 360 ml  Output -  Net 360 ml     Physical Exam: Vital Signs Blood pressure 131/64, pulse (!) 129, temperature 99.6 F (37.6 C), temperature source Oral, resp. rate 18, height 5\' 7"  (1.702 m), weight 57.9 kg, SpO2 97 %. Constitutional: NAD.  Vital signs reviewed. HENT: Normocephalic.  Atraumatic. Eyes: EOMI.  No discharge. Cardiovascular: No JVD. Respiratory: Normal effort.  No stridor. GI: Distended, improving.  Bowel sounds hypoactive. Skin: Warm and dry.  Intact. Psych: Flat. Musc: No edema in extremities.  No tenderness in  extremities. Neuro: Alert Motor: Bilateral upper extremities: 5/5 proximal distal Right lower extremity: 3-/5 proximal distal (?  Effort) Left lower extremity: Ankle plantarflexion 3+/5, otherwise 0/5, stable Sensation diminished to light touch left lower extremity, stable  Assessment/Plan: 1. 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  Physiatrist is providing close team supervision and 24 hour management of active medical problems listed below.  Physiatrist and rehab team continue to assess barriers to discharge/monitor patient progress toward functional and medical goals  Care Tool:  Bathing  Bathing activity did not occur: Refused Body parts bathed by patient: Right arm, Left arm, Chest, Abdomen, Front perineal area, Right upper leg, Left upper leg, Face, Buttocks, Right lower leg, Left lower leg   Body parts bathed by helper: Buttocks, Right lower leg, Left lower leg     Bathing assist Assist Level: Set up assist     Upper Body Dressing/Undressing Upper body dressing   What is the patient wearing?: Pull over shirt, Orthosis Orthosis activity level: Performed by patient  Upper body assist Assist Level: Set up assist    Lower Body Dressing/Undressing Lower body dressing      What is the patient wearing?: Underwear/pull up, Pants     Lower body assist Assist for lower body dressing: Minimal Assistance - Patient > 75%     Toileting Toileting Toileting Activity did not occur Landscape architect and hygiene only): Refused  Toileting assist Assist for toileting: Total Assistance - Patient < 25%     Transfers Chair/bed transfer  Transfers assist     Chair/bed transfer assist level: Minimal Assistance - Patient > 75%     Locomotion Ambulation   Ambulation assist      Assist level: Contact Guard/Touching assist Assistive device: Walker-rolling Max distance: 45'   Walk 10 feet activity   Assist     Assist level: Contact Guard/Touching assist Assistive device: Walker-rolling   Walk 50 feet activity   Assist Walk 50 feet with 2 turns  activity did not occur: Safety/medical concerns         Walk 150 feet activity   Assist Walk 150 feet activity did not occur: Safety/medical concerns         Walk 10 feet on uneven surface  activity   Assist Walk 10 feet on uneven surfaces activity did not occur: Safety/medical concerns         Wheelchair     Assist Will patient use wheelchair at discharge?: Yes Type of Wheelchair: Manual Wheelchair activity did not occur: Safety/medical concerns  Wheelchair assist level: Independent Max wheelchair distance: 150    Wheelchair 50 feet with 2 turns activity    Assist    Wheelchair 50 feet with 2 turns activity did not occur: Safety/medical concerns   Assist Level: Independent   Wheelchair 150 feet activity     Assist    Wheelchair 150 feet activity did not occur: Safety/medical concerns   Assist Level: Independent   Medical Problem List and Plan: 1.Decreased functional mobilitysecondary to 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   Continue CIR -Back brace when out of bed. -Plan is to follow-up outpatient Dr.  Mohammed in regards to chemotherapy  Chemotherapy was to begin on 9/17, however now on hold  2. Antithrombotics: -DVT/anticoagulation:SCDs. Lovenox  LE venous dopplers negative for DVT -antiplatelet therapy: N/A 3. Pain Management:  Neurontin 300 mg 3 times daily  Fentanyl patch 75 mcg, Advil 400 mg  Ibuprofen changed to PRN on 9/5, DC'd on 9/17 due to EGD findings and discussion with hospitalist  9/16- oxy is q4 hours prn   Lidoderm patch added on 9/17 to back with improvement 4.  Mood:motivational support -antipsychotic agents: N/A 5. Neuropsych: This patientiscapable of making decisions on herown behalf. 6. Skin/Wound Care:Routine skin checks 7. Fluids/Electrolytes/Nutrition:Routine in and outs 8. Acute blood loss anemia.   Hemoglobin 9.1 on 9/18  Trending down, await further GI recs  Continue to monitor 9. Leukocytosis secondary to steroids. Continue to monitor  WBCs 8.7 on 9/18  Afebrile  Continue to monitor 10. Urinary retention/acute lower UTI:  Keflex, started on 9/13, changed to [place] on 9/17 11. Constipation in setting of Neurogenic bowel and opioid-induced constipation. Adjust bowel program as needed  Bowel program with dig stim at night/evening.   Linzess changed to [place] on 9/17  Miralax prn  Appears to be improving  KUB personally reviewed, showing improvement since suggestion of ileus on 9/17 12.  Steroid-induced hyperglycemia  Continue to monitor  Relatively controlled on 9/17 13. Severe abd pain  CT showed ascites and gallbladder sludge- GI consulted- ordered U/S guided paracentesis- GI ordered associated labs    EGD performed by GI, appreciate recs  PPI twice daily x8 weeks then daily as long as patient requires steroids  Await cytology results  Hospitalist following, appreciate recs  Discussed with Hospitalist potential contributers, abx changes, linzess changed to movantik  Diet changed back to regular diet after being on liquids on 9/17  Improving 14.  Sleep disturbance  Trazodone 50 nightly as needed  15.  Hyponatremia  Sodium 132 on 9/18  Continue to monitor 16.  Transaminitis  LFTs elevated on 9/18  Continue to monitor 17.  Oral candidiasis  Noted on EGD  Diflucan 100 mg daily x7 days per GI 18.  Tachycardia  Likely secondary to above  ECG reviewed, limited  Low-dose metoprolol started on 9/18  First troponin borderline negative, repeat pending 19.  Hypokalemia  Potassium 3.4 on  9/18  Supplement initiated  LOS: 17 days A FACE TO FACE EVALUATION WAS PERFORMED  Kendallyn Lippold Lorie Phenix 05/31/2019, 10:32 AM

## 2019-05-31 NOTE — Progress Notes (Signed)
Physical Therapy Weekly Progress Note  Patient Details  Name: Rebecca Lucas MRN: [id number] Date of Birth: 1956/06/10  Beginning of progress report period: May 22, 2019 End of progress report period: May 31, 2019  Today's Date: 05/31/2019 PT Individual Time: 1000-1045 PT Individual Time Calculation (min): 45 min   Patient has met 0 of 1 short term goals.  STG were set as LTG due to ELOS. However, during the past week pt had a change in medical status with onset of severe abdominal pain and ascites and is thought to have abdominal ulcers. Pt missed several days of therapy due to ongoing pain and fatigue. Pt was able to participate more in therapy this date and spend some time out of bed. However pt has lost functional gains and is now max to total A for sit to stand or squat pivot transfers. Pt is able to propel w/c with Supervision assist with increased time needed due to pain and fatigue. Pt also now requires mod A for bed mobility whereas previously she was able to perform this with Supervision. Pt would benefit from a continued stay on rehab to regain strength and endurance as she was making good progress and on track to return home at a Supervision level prior to onset of medical issues.  Patient continues to demonstrate the following deficits muscle weakness and decreased sitting balance, decreased standing balance, decreased postural control and decreased balance strategies and therefore will continue to benefit from skilled PT intervention to increase functional independence with mobility.  Patient progressing toward long term goals..  Continue plan of care.  PT Short Term Goals Week 2:  PT Short Term Goal 1 (Week 2): =LTG due to ELOS PT Short Term Goal 1 - Progress (Week 2): Progressing toward goal Week 3:  PT Short Term Goal 1 (Week 3): Pt will complete transfers with mod A consistently PT Short Term Goal 2 (Week 3): Pt will perform sit to stand with mod A PT Short Term  Goal 3 (Week 3): Pt will tolerate sitting up in chair x 1 hour  Skilled Therapeutic Interventions/Progress Updates:    Pt received sidelying in bed, reports new onset of R hip pain. Pain is not rated, hot pack to R hip at end of session for pain management. Supine to sit with mod A for LLE management and trunk control. Sit to stand with max A to stedy from elevated bed. Manual w/c propulsion x 150 ft with use of BUE and Supervision for global endurance training. Squat pivot transfer w/c to bed with max to total A. Sit to supine mod A for BLE management. Pt reports feeling as if she has had bowel incontinence. Rolling L/R with mod A for dependent brief change and pericare, pt just has smear in underwear. Pt requests to lay on her back and sit up in bed to eat some breakfast at end of session. Pt left semi-reclined in bed with needs in reach, bed alarm in place, hot pack to R hip.  Therapy Documentation Precautions:  Precautions Precautions: Fall, Back Precaution Comments: reviewed spinal precautions Required Braces or Orthoses: Spinal Brace Spinal Brace: Lumbar corset, Applied in sitting position Restrictions Weight Bearing Restrictions: No   Therapy/Group: Individual Therapy   Excell Seltzer, PT, DPT  05/31/2019, 7:54 AM

## 2019-05-31 NOTE — Progress Notes (Signed)
Social Work Patient ID: Rebecca Lucas, female   DOB: May 15, 1956, 63 y.o.   MRN: [id number]  Have spoken with pt and spouse after discussion with Marlowe Shores, PA regarding anticipated readiness for d/c.  Pt very happy that she is feeling much better today and was able to complete morning therapies.  PA anticipates pt staying on CIR through the weekend and hope for d/c beginning of next week if back to therapy goals and no further medical issues.  Pt and spouse are agreeable with this.  Insurance did extend authorization through 06/04/19 as well.  Have alerted [facility] that we will inform of d/c when confirmed so they can begin [facility] services.  DME already delivered and spouse has taken items home already.  Continue to follow.   Osiris Charles, LCSW

## 2019-05-31 NOTE — Progress Notes (Signed)
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 Orson Slick.She had acute blood loss anemia and she was subsequently transfused. Patient was admitted for inpatient rehab and was doing well until this weekend.  Patient reports progressive abdominal pain and distention for 3 days associated with decreased appetite and nausea. She had CT abdomen/pelvis on 05/27/2019 which showed interval development of small to moderate volume of ascites &Gallbladder sludge. GI was consulted who recommended ultrasound-guided paracentesis by IR,Protonix 40 mg IV twice daily, Carafate 1 g 3 times daily and EGD-however IRdeferred theparacentesis aslimited ultrasound did not show fluid.   Dr. Dagoberto Ligas (physical medicine and rehab MD)consulted [facility] for further evaluation and management of her abdominal distention and pain.  She underwent EGD (05/29/2019) and per report was noted to have whitish patches throughout the esophagus suspicious for candidiasis, cytology brushing obtained.  Multiple nonbleeding cratered, linear and superficial gastric ulcers some with pigmented material and some with clean bases found in the gastric fundus, body,  greater curvature of the stomach, lesser curvature of the stomach incisura and gastric antrum, biopsies taken. Erythematous gastric antrum, biopsies taken for H. Pylori. GI recommended PPI twice daily for 8 weeks followed by once a day as long as patient needs any NSAIDs and steroid.  Also started her on Diflucan 100 mg p.o. daily for 7 days.   Assessment & Plan:   Principal Problem:   Abdominal pain Active Problems:   Cauda equina syndrome (HCC)   Foot drop, left   Neurogenic bowel   Bandemia   Steroid-induced hyperglycemia   Leucocytosis   Acute blood loss anemia   Postoperative pain   Abdominal distension   Ascites   Therapeutic opioid induced constipation   Oral candidiasis   Transaminitis   Hyponatremia   Acute lower UTI   Cancer related pain   Hypokalemia   Sinus tachycardia  GeneralizedAbdominal Pain & Distention: -Unknown Etiology- IR tried to perform paracentesis howeverparacentesis was deferred asLimited US revealed No fluid. -CT abdomen/pelvis from 05/27/2019 revealed mild to moderate ascites and sludge in gallbladder.X-ray KUB on 9/14 showed no acute findings -Patient continued to complain of abdominal pain, distention associated withbloating,nausea and decreased appetite -GI consulted, patient underwent EGD with findings as mentioned above showing gastric ulceration with white patches throughout the esophagus. -Continue Protonix 40 mg twice daily, Carafate 1 g 3 times, Carafate daily as per GI recommendation -Continue pain meds per primary.  Avoid NSAIDs if possible. -Lipase level normal.  Acute hepatitis panel negative.   05/31/2019: KUB yesterday showed probable dilated large or small bowel loop in the right lower quadrant consistent with focal ileus. -Started on Movantik per primary team.  She says she had 2 bowel movements and says that her abdominal pain is much better.  KUB today showing some improvement.  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 -Supposed to restart chemo on 05/30/2019 outpatient. -PT is on board/ -She has weakness with debility left greater than right.  Leukocytosis: -Likely secondary to steroid use -WBC count is trending down patient is afebrile. -WBC count: 24.6 on 9/7, 11.6 on 9/16; 10.8 on 9/17; 8.7on 9/18.  ChronicConstipation: -Secondary to metastatic spinal tumor versus pain medication - Previously on Dulcolax suppository 10 mg daily, MiraLAX and Linzess -Movantik added per primary.  She says she had 2 BMs and feels pressure.  UTI: -Urine culture came back positive for E. Coli- Keflex(4 days) and then switched to Macrobid per the primary team due to concern for onset of abdominal pain around the time Keflex was initiated. -Do not think the abdominal pain was related to the antibiotic. -She denies having any urinary symptoms at this time.  Would recommend to discontinue the antibiotic after today's dose.  Acute blood loss anemia: -Patient received3 units PRBC08/26/2020 -H&H is stable-no signs of bleeding.  Thrombocytopenia: -Patient noted to have low platelet count.  Exact etiology unclear at this time. -She is on subcutaneous Lovenox for DVT prophylaxis. If platelet count continuing to be low then we may need to order HIT panel and further work-up to evaluate.  Hypokalemia: -Potassium replacement ordered.  Continue to monitor and replace as needed.  We will also order magnesium level.   Subjective: She says her abdominal pain has improved. She had 2 bowel movements.  Objective: Vitals:   05/31/19 0348 05/31/19 0349 05/31/19 0447 05/31/19 1323  BP:    136/76  Pulse: (!) 126  (!) 129 (!) 129 (!) 109  Resp:    16  Temp:    98.5 F (36.9 C)  TempSrc:    Oral  SpO2:  97%  96%  Weight:      Height:        Intake/Output Summary (Last 24 hours) at 05/31/2019 1411 Last data filed at 05/31/2019 1351 Gross per 24 hour  Intake 360 ml  Output 800 ml  Net -440 ml   Filed Weights   05/26/19 0452 05/27/19 0414 05/29/19 0500  Weight: 60.4 kg 56 kg 57.9 kg    Examination:  General exam: Awake and oriented, not in any acute distress at this time Respiratory system: Decreased breath sounds lower lobes otherwise clear to auscultation. Cardiovascular system: S1 & S2. No murmur. Gastrointestinal system: Abdominal distention improving, mild tenderness without any guarding or rebound. Normal bowel sounds heard. Central nervous system: Alert and oriented.  Generalized weakness, worse in lower extremities left greater than right Extremities: Symmetric 5 x 5 power. Skin: No rashes, lesions or ulcers Psychiatry: Judgement and insight appear normal. Mood & affect appropriate.     Data Reviewed: I have personally reviewed following labs and imaging studies  CBC: Recent Labs  Lab 05/27/19 0420 05/29/19 0428 05/30/19 0433 05/31/19 0455  WBC 17.8* 11.6* 10.8* 8.7  NEUTROABS 16.5* 10.9* 10.1*  --   HGB 10.7* 10.3* 10.0* 9.1*  HCT 33.2* 29.6* 30.2* 27.9*  MCV 98.5 95.8 96.2 97.6  PLT 203 116* 107* 950*   Basic Metabolic Panel: Recent Labs  Lab 05/27/19 0420 05/29/19 0428 05/30/19 0433 05/31/19 0455  NA 135 129* 130* 132*  K 4.2 3.5 3.7 3.4*  CL 101 99 101 105  CO2 23 22 22  20*  GLUCOSE 93 119* 99 101*  BUN 18 12 12 16   CREATININE 0.57 0.43* 0.37* 0.48  CALCIUM 8.5* 7.5* 7.2* 7.4*   GFR: Estimated Creatinine Clearance: 67.5 mL/min (by C-G formula based on SCr of 0.48 mg/dL). Liver Function Tests: Recent Labs  Lab 05/27/19 0420 05/29/19 0428 05/30/19 0433 05/31/19 0455  AST 39 35 55* 53*  ALT 89* 61* 74* 78*  ALKPHOS 118 104 151* 172*  BILITOT 0.5  0.5 0.4 0.5  PROT 5.7* 4.9* 4.7* 4.6*  ALBUMIN 2.5* 2.1* 2.0* 2.0*   Recent Labs  Lab 05/29/19 1559  LIPASE 36   No results for input(s): AMMONIA in the last 168 hours. Coagulation Profile: No results for input(s): INR, PROTIME in the last 168 hours. Cardiac Enzymes: No results for input(s): CKTOTAL, CKMB, CKMBINDEX, TROPONINI in the last 168 hours. BNP (last 3 results) No results for input(s): PROBNP in the last 8760 hours. HbA1C: No results for input(s): HGBA1C in the last 72 hours. CBG: No results for input(s): GLUCAP in the last 168 hours. Lipid Profile: No results for input(s): CHOL, HDL, LDLCALC, TRIG, CHOLHDL, LDLDIRECT in the last 72 hours. Thyroid Function Tests: No results for input(s): TSH, T4TOTAL, FREET4, T3FREE, THYROIDAB in the last 72 hours. Anemia Panel: No results for input(s): VITAMINB12, FOLATE, FERRITIN, TIBC, IRON, RETICCTPCT in the last 72 hours. Sepsis Labs: No results for input(s): PROCALCITON, LATICACIDVEN in the last 168 hours.  Recent Results (from the past 240 hour(s))  Urine Culture     Status: Abnormal   Collection Time: 05/25/19  1:28 PM   Specimen: Urine, Catheterized  Result Value Ref Range Status   Specimen Description URINE, CATHETERIZED  Final   Special Requests   Final    NONE Performed at [facility], [address] [place], [place] [address]    Culture >=100,000 COLONIES/mL ESCHERICHIA COLI (A)  Final   Report Status 05/27/2019 FINAL  Final   Organism ID, Bacteria ESCHERICHIA COLI (A)  Final      Susceptibility   Escherichia coli - MIC*    AMPICILLIN <=2 SENSITIVE Sensitive     CEFAZOLIN <=4 SENSITIVE Sensitive     CEFTRIAXONE <=1 SENSITIVE Sensitive     CIPROFLOXACIN <=0.25 SENSITIVE Sensitive     GENTAMICIN <=1 SENSITIVE Sensitive     IMIPENEM <=0.25 SENSITIVE Sensitive     NITROFURANTOIN <=16 SENSITIVE Sensitive     TRIMETH/SULFA <=20 SENSITIVE Sensitive     AMPICILLIN/SULBACTAM <=2 SENSITIVE Sensitive      PIP/TAZO <=4 SENSITIVE Sensitive     Extended ESBL NEGATIVE Sensitive     * >=100,000 COLONIES/mL ESCHERICHIA COLI  SARS Coronavirus 2 [facility] order, Performed in [facility] hospital lab) Nasopharyngeal Nasopharyngeal Swab     Status: None   Collection Time: 05/28/19  4:05 PM   Specimen: Nasopharyngeal Swab  Result Value Ref Range Status   SARS Coronavirus 2 NEGATIVE NEGATIVE Final    Comment: (NOTE) If result is NEGATIVE SARS-CoV-2 target nucleic acids are NOT DETECTED. The SARS-CoV-2 RNA is generally detectable in upper and lower  respiratory specimens during the acute phase of infection. The lowest  concentration of SARS-CoV-2 viral copies this assay can detect is 250  copies / mL. A negative result does not preclude SARS-CoV-2 infection  and should not be used as the sole basis for treatment or other  patient management decisions.  A negative result may occur with  improper specimen collection / handling, submission of specimen other  than nasopharyngeal swab, presence of viral  mutation(s) within the  areas targeted by this assay, and inadequate number of viral copies  (<250 copies / mL). A negative result must be combined with clinical  observations, patient history, and epidemiological information. If result is POSITIVE SARS-CoV-2 target nucleic acids are DETECTED. The SARS-CoV-2 RNA is generally detectable in upper and lower  respiratory specimens dur ing the acute phase of infection.  Positive  results are indicative of active infection with SARS-CoV-2.  Clinical  correlation with patient history and other diagnostic information is  necessary to determine patient infection status.  Positive results do  not rule out bacterial infection or co-infection with other viruses. If result is PRESUMPTIVE POSTIVE SARS-CoV-2 nucleic acids MAY BE PRESENT.   A presumptive positive result was obtained on the submitted specimen  and confirmed on repeat testing.  While 2019 novel  coronavirus  (SARS-CoV-2) nucleic acids may be present in the submitted sample  additional confirmatory testing may be necessary for epidemiological  and / or clinical management purposes  to differentiate between  SARS-CoV-2 and other Sarbecovirus currently known to infect humans.  If clinically indicated additional testing with an alternate test  methodology [phone]) is advised. The SARS-CoV-2 RNA is generally  detectable in upper and lower respiratory sp ecimens during the acute  phase of infection. The expected result is Negative. Fact Sheet for Patients:  StrictlyIdeas.no Fact Sheet for Healthcare Providers: BankingDealers.co.za This test is not yet approved or cleared by the [place] FDA and has been authorized for detection and/or diagnosis of SARS-CoV-2 by FDA under an Emergency Use Authorization (EUA).  This EUA will remain in effect (meaning this test can be used) for the duration of the COVID-19 declaration under Section 564(b)(1) of the Act, 21 U.S.C. section 360bbb-3(b)(1), unless the authorization is terminated or revoked sooner. Performed at [facility], [place] [address], [place], [place] [address]          Radiology Studies: Dg Abd 1 View  Result Date: 05/31/2019 CLINICAL DATA:  Abdominal pain. EXAM: ABDOMEN - 1 VIEW COMPARISON:  05/30/2019. FINDINGS: Previously identified dilated bowel loops over the right abdomen has improved. No prominent bowel distention on today's exam. No free air. Bladder distention cannot be excluded. Prior lumbar spine fusion. Hardware intact. No acute bony abnormality IMPRESSION: 1. Previously identified dilated bowel and has improved. No prominent bowel distention on today's exam. 2.  Bladder distention cannot be excluded. Electronically Signed   By: Marcello Moores  Register   On: 05/31/2019 07:28   Dg Abd 1 View  Result Date: 05/30/2019 CLINICAL DATA:  Acute generalized abdominal pain.  EXAM: ABDOMEN - 1 VIEW COMPARISON:  Radiographs of May 29, 2019. CT scan of May 27, 2019. FINDINGS: Probable dilated large or small bowel loop is noted in right lower quadrant, most consistent with focal ileus. No abnormal calcifications are noted. Postsurgical changes are seen involving the lumbar spine. IMPRESSION: Probable dilated large or small bowel loop is noted in right lower quadrant, most consistent with focal ileus. Electronically Signed   By: Marijo Conception M.D.   On: 05/30/2019 13:38   Dg Abd 1 View  Result Date: 05/29/2019 CLINICAL DATA:  Abdominal distension, history of ulcer EXAM: ABDOMEN - 1 VIEW COMPARISON:  Radiograph 05/27/2019, CT abdomen pelvis 05/27/2019 FINDINGS: The bowel gas pattern is normal. Evaluation for free intraperitoneal air is limited on this supine only exam. Postsurgical changes from prior T12-L4 thoracolumbar fusion. Irregular sclerosis of the upper lumbar levels compatible with osseous metastatic disease seen on most  recent comparison CT. Stable appearance of the pathologic fracture of L2 when compared to prior. Radio-opaque calculi or other significant radiographic abnormality are seen. IMPRESSION: Nonobstructive bowel gas pattern. Known osseous metastatic disease is better seen on prior CT. Unchanged appearance of the L2 pathologic compression deformity. Electronically Signed   By: Lovena Le M.D.   On: 05/29/2019 20:07        Scheduled Meds: . acetaminophen  650 mg Oral Q6H  . bisacodyl  10 mg Rectal Daily  . calcium-vitamin D   Oral BID  . Chlorhexidine Gluconate Cloth  6 each Topical Q0600  . dexamethasone  3 mg Oral Daily  . enoxaparin (LOVENOX) injection  40 mg Subcutaneous Q24H  . fentaNYL  1 patch Transdermal Q72H  . fluconazole  100 mg Oral Daily  . gabapentin  300 mg Oral TID  . lidocaine  1 patch Transdermal Q24H  . metoprolol tartrate  12.5 mg Oral BID  . [START ON 06/01/2019] naloxegol oxalate  12.5 mg Oral Daily  .  nitrofurantoin (macrocrystal-monohydrate)  100 mg Oral Q12H  . pantoprazole  40 mg Oral BID  . polyethylene glycol  17 g Oral BID  . potassium chloride  20 mEq Oral Daily  . potassium chloride  40 mEq Oral Once  . senna-docusate  1 tablet Oral BID  . sucralfate  1 g Oral TID WC & HS   Continuous Infusions: . sodium chloride 75 mL/hr at 05/31/19 0320     LOS: 17 days    Yaakov Guthrie, MD [facility] Pager on amion  If 7PM-7AM, please contact night-coverage [email or web address] Password [facility] 05/31/2019, 2:11 PM

## 2019-05-31 NOTE — Progress Notes (Signed)
6015: Pulse: Radial pulse 126 in lying position.  6153: On call practitioner contacted. EKG ordered.  0410: EKG completed. Charge nurse reported findings to Practitioner.  0447: Apical pulse: 129. Patient states, pain 5/10.   Patient denies any needs, call bell within reach, bed in lowest position. Will continue to monitor.

## 2019-05-31 NOTE — Plan of Care (Signed)
  Problem: Consults Goal: RH SPINAL CORD INJURY PATIENT EDUCATION Description:  See Patient Education module for education specifics.  05/31/2019 1015 by Gerald Stabs, RN Outcome: Progressing 05/31/2019 0640 by Gerald Stabs, RN Outcome: Progressing Goal: Skin Care Protocol Initiated - if Braden Score 18 or less Description: If consults are not indicated, leave blank or document N/A 05/31/2019 1015 by Gerald Stabs, RN Outcome: Progressing 05/31/2019 0640 by Gerald Stabs, RN Outcome: Progressing   Problem: SCI BOWEL ELIMINATION Goal: RH STG MANAGE BOWEL WITH ASSISTANCE Description: STG Manage Bowel with mod I Assistance. 05/31/2019 1015 by Gerald Stabs, RN Outcome: Progressing 05/31/2019 0640 by Gerald Stabs, RN Outcome: Not Progressing Goal: RH STG SCI MANAGE BOWEL WITH MEDICATION WITH ASSISTANCE Description: STG SCI Manage bowel with medication with mod I assistance. 05/31/2019 1015 by Gerald Stabs, RN Outcome: Progressing 05/31/2019 0640 by Gerald Stabs, RN Outcome: Not Progressing Goal: RH STG SCI MANAGE BOWEL PROGRAM W/ASSIST OR AS APPROPRIATE Description: STG SCI Manage bowel program mod I assist or as appropriate. 05/31/2019 1015 by Gerald Stabs, RN Outcome: Progressing 05/31/2019 0640 by Gerald Stabs, RN Outcome: Not Progressing   Problem: RH SKIN INTEGRITY Goal: RH STG MAINTAIN SKIN INTEGRITY WITH ASSISTANCE Description: STG Maintain Skin Integrity With mod I Assistance. 05/31/2019 1015 by Gerald Stabs, RN Outcome: Progressing 05/31/2019 0640 by Gerald Stabs, RN Outcome: Progressing   Problem: RH PAIN MANAGEMENT Goal: RH STG PAIN MANAGED AT OR BELOW PT'S PAIN GOAL Description: < 4 05/31/2019 1015 by Gerald Stabs, RN Outcome: Progressing 05/31/2019 0640 by Gerald Stabs, RN Outcome: Not Progressing

## 2019-05-31 NOTE — Progress Notes (Signed)
Received call from [facility] NP, we reviewed EKG from this morning and compared to EKG from 03/23/2018. He recommended drawing a Trop X1. Placed a call to [facility] and ordered was given, she will discussed the above with Marlowe Shores PA.

## 2019-05-31 NOTE — Op Note (Signed)
[facility] Patient Name: Rebecca Lucas Procedure Date : 05/29/2019 MRN: [id number] Attending MD: Ronnette Juniper , MD Date of Birth: 05-08-1956 CSN: [id number] Age: 63 Admit Type: Inpatient Procedure:                Upper GI endoscopy Indications:              Epigastric abdominal pain, Upper abdominal pain,                            history of metastatic lung cancer and spinal                            metastases, cauda equina syndrome Providers:                Ronnette Juniper, MD, Josie Dixon, RN, Elspeth Cho                            Tech., Technician, Lazaro Arms, Technician Referring MD:              Medicines:                Monitored Anesthesia Care Complications:            No immediate complications. Estimated blood loss:                            Minimal. Estimated Blood Loss:     Estimated blood loss was minimal. Procedure:                Pre-Anesthesia Assessment:                           - Prior to the procedure, a History and Physical                            was performed, and patient medications and                            allergies were reviewed. The patient's tolerance of                            previous anesthesia was also reviewed. The risks                            and benefits of the procedure and the sedation                            options and risks were discussed with the patient.                            All questions were answered, and informed consent                            was obtained. Prior Anticoagulants: The patient has  taken no previous anticoagulant or antiplatelet                            agents. ASA Grade Assessment: III - A patient with                            severe systemic disease. After reviewing the risks                            and benefits, the patient was deemed in                            satisfactory condition to undergo the procedure.  After obtaining informed consent, the endoscope was                            passed under direct vision. Throughout the                            procedure, the patient's blood pressure, pulse, and                            oxygen saturations were monitored continuously. The                            GIF-H190 ([id number]) Olympus gastroscope was                            introduced through the mouth, and advanced to the                            second part of duodenum. The upper GI endoscopy was                            accomplished without difficulty. The patient                            tolerated the procedure well. Scope In: Scope Out: Findings:      Patchy, white plaques were found in the upper third of the esophagus, in       the middle third of the esophagus and in the lower third of the       esophagus. Cells for cytology were obtained by brushing.      The Z-line was regular and was found 36 cm from the incisors.      Many non-bleeding cratered, linear and superficial gastric ulcers with       pigmented material as well as clean based were found in the gastric       fundus, in the gastric body, on the greater curvature of the stomach, on       the lesser curvature of the stomach, at the incisura and in the gastric       antrum. The largest lesion was 8 mm in largest dimension. Biopsies were       taken with a cold forceps for histology.      Localized mildly erythematous mucosa  without bleeding was found in the       gastric antrum. Biopsies were taken with a cold forceps for Helicobacter       pylori testing.      The examined duodenum was normal. Impression:               - Esophageal plaques were found, suspicious for                            candidiasis. Cells for cytology obtained.                           - Z-line regular, 36 cm from the incisors.                           - Non-bleeding gastric ulcers with pigmented                            material.  Biopsied.                           - Erythematous mucosa in the antrum. Biopsied.                           - Normal examined duodenum. Moderate Sedation:      Patient did not receive moderate sedation for this procedure, but       instead received monitored anesthesia care. Recommendation:           - Advance diet as tolerated.                           - Use Protonix (pantoprazole) 40 mg PO BID for 8                            weeks.                           - Continue PPI daily as long as patient needs                            steroids or NSAIDs.                           - Await pathology results.                           - Diflucan (fluconazole) 100 mg PO daily for 7 days. Procedure Code(s):        --- Professional ---                           [phone], Esophagogastroduodenoscopy, flexible,                            transoral; with biopsy, single or multiple Diagnosis Code(s):        --- Professional ---  K22.9, Disease of esophagus, unspecified                           K25.9, Gastric ulcer, unspecified as acute or                            chronic, without hemorrhage or perforation                           K31.89, Other diseases of stomach and duodenum                           R10.13, Epigastric pain                           R10.10, Upper abdominal pain, unspecified CPT copyright 2019 [employer]. All rights reserved. The codes documented in this report are preliminary and upon coder review may  be revised to meet current compliance requirements. Ronnette Juniper, MD 05/29/2019 1:56:56 PM This report has been signed electronically. Number of Addenda: 0

## 2019-05-31 NOTE — Progress Notes (Signed)
Received a call at 4:00 reporting Rebecca Lucas was tachycardic apical pulse was 130, no distress noted. EKG ordered. Rebecca Lucas has an IV of  NS @ 75 ml/hr infusing. Apical pulse re-checked HR 129, no distress noted. We will continue to monitor and check vitals every 2 hours. EKG reviewed, call placed to [facility].

## 2019-05-31 NOTE — Progress Notes (Signed)
Occupational Therapy Weekly Progress Note  Patient Details  Name: [name] MRN: [id number] Date of Birth: 06-Nov-1955  Beginning of progress report period: May 21, 2019 End of progress report period: May 31, 2019  Today's Date: 05/31/2019 OT Individual Time: 0102-7253 OT Individual Time Calculation (min): 45 min    STG not set as pt was on track to meet supervision level goals and preparing for upcoming d/c home. However, pt with medical decline over the past week limiting her participation in therapies. Pt very weak/letharigc and limited by severe abdominal pain.  As of today, pt feeling better with better participation in therapy/OOB tolerance. However, she requires max-total A for sit>stand and cont to require increased time and rest breaks 2/2 pain and generalized weakness. Pt will cont to benefit from ongoing therapy in order to return to near goal level of supervision overall.   Patient continues to demonstrate the following deficits: muscle weakness and muscle paralysis, decreased cardiorespiratoy endurance, ataxia and decreased coordination and decreased sitting balance, decreased standing balance, decreased postural control, decreased balance strategies and difficulty maintaining precautions and therefore will continue to benefit from skilled OT intervention to enhance overall performance with BADL and Reduce care partner burden.  Patient progressing toward long term goals..  Cont plan of care at this time, however, may need to downgrade goals pending pt progress  OT Short Term Goals Week 2:  OT Short Term Goal 1 (Week 2): STG=LTG due to LOS OT Short Term Goal 1 - Progress (Week 2): Other (comment)(Pt with medical decline over past reporting period limiting progress towards goals and extending LOS) Week 3:    Cont work to return to LTG of supervision overall  Skilled Therapeutic Interventions/Progress Updates:    Pt seen for OT session focusing on functional  mobility and ADL re-training. Pt awake in supine upon arrival eating breakfast. She reports feeling much better this AM compared to yesterday. Reports 4/10 pain in back/R hip but willing to cont as able. Increased pain with mobility and RN made aware of need for pain meds during session.   Transfers: Transferred to sitting EOB from supine with mod-max A using hospital bed functions. Trialed use of STEDY as nursing required +3 assist to get her to [facility] last night ant wanting to find safe and consistent transfer method based on pt's fatigue and pain levels. SHe required max A to stand from highly elevated EOB into STEDY and controlled descent back into w/c. Sit>stand at sink with max-total A, pt with incontinent urination upon stand.  ADL re-training: Grooming tasks from w/c level at sink with set-up/supervision. Mod A UB dressing. Total A LB dressing.  Pt left seated in w/c at end of session with RN present providing care.   Therapy Documentation Precautions:  Precautions Precautions: Fall, Back Precaution Comments: reviewed spinal precautions Required Braces or Orthoses: Spinal Brace Spinal Brace: Lumbar corset, Applied in sitting position Restrictions Weight Bearing Restrictions: No   Therapy/Group: Individual Therapy  Yishai Rehfeld L 05/31/2019, 6:47 AM

## 2019-05-31 NOTE — Progress Notes (Signed)
   05/31/19 1351  Unmeasured Output  Urine Occurrence 1  Urine Characteristics  Urinary Incontinence No  Urine Color Amber  Urine Appearance Cloudy  Urinary Interventions Bladder scan;Intermittent/Straight cath  Bladder Scan Volume (mL) 656 mL (PVR)  Intermittent/Straight Cath (mL) 800 mL  Hygiene Peri care  Pt is retaining urine. Bladder scanned for 656 after a void on BSC. Linna Hoff, PA made aware. No new orders at this time. continue plan of care. Gerald Stabs, RN

## 2019-05-31 NOTE — Progress Notes (Signed)
Occupational Therapy Session Note  Patient Details  Name: Rebecca Lucas MRN: [id number] Date of Birth: 23-Dec-1955  Today's Date: 05/31/2019 OT Individual Time: 1345-1415 OT Individual Time Calculation (min): 30 min    Short Term Goals: Week 2:  OT Short Term Goal 1 (Week 2): STG=LTG due to LOS OT Short Term Goal 1 - Progress (Week 2): Other (comment)(Pt with medical decline over past reporting period limiting progress towards goals and extending LOS)  Skilled Therapeutic Interventions/Progress Updates:    Treatment session with focus on bed mobility and sit <> stand for increased activity tolerance and decreased burden of care.  Pt received supine in bed with RN just finishing care.  Pt completed rolling with min assist and mod assist sidelying to sitting with assist to advance LLE to EOB.  Engaged in sit > stand x5 with one stand from EOB in to [place] with max assist and then min assist for remaining sit > stand from [place].  Cues provided for upright posture in standing, pt only able to tolerate ~5 seconds each attempt.  Returned to sitting on bed and then back to bed with mod assist.  Pt reports fatigue but pleased with ability to complete task.  Therapy Documentation Precautions:  Precautions Precautions: Fall, Back Precaution Comments: reviewed spinal precautions Required Braces or Orthoses: Spinal Brace Spinal Brace: Lumbar corset, Applied in sitting position Restrictions Weight Bearing Restrictions: No General:   Vital Signs: Therapy Vitals Temp: 98.5 F (36.9 C) Temp Source: Oral Pulse Rate: (!) 109 Resp: 16 BP: 136/76 Patient Position (if appropriate): Lying Oxygen Therapy SpO2: 96 % O2 Device: Room Air Pain:  Pt with c/o pain 6/10.  Premedicated.   Therapy/Group: Individual Therapy  Simonne Come 05/31/2019, 3:15 PM

## 2019-05-31 NOTE — Plan of Care (Signed)
  Problem: Consults Goal: RH SPINAL CORD INJURY PATIENT EDUCATION Description:  See Patient Education module for education specifics.  Outcome: Progressing Goal: Skin Care Protocol Initiated - if Braden Score 18 or less Description: If consults are not indicated, leave blank or document N/A Outcome: Progressing   Problem: RH SKIN INTEGRITY Goal: RH STG MAINTAIN SKIN INTEGRITY WITH ASSISTANCE Description: STG Maintain Skin Integrity With mod I Assistance. Outcome: Progressing   Problem: SCI BOWEL ELIMINATION Goal: RH STG MANAGE BOWEL WITH ASSISTANCE Description: STG Manage Bowel with mod I Assistance. Outcome: Not Progressing, needing 2 assist Goal: RH STG SCI MANAGE BOWEL WITH MEDICATION WITH ASSISTANCE Description: STG SCI Manage bowel with medication with mod I assistance. Outcome: progressing Goal: RH STG SCI MANAGE BOWEL PROGRAM W/ASSIST OR AS APPROPRIATE Description: STG SCI Manage bowel program mod I assist or as appropriate. Outcome: Not Progressing, needing 2+ assist   Problem: RH PAIN MANAGEMENT Goal: RH STG PAIN MANAGED AT OR BELOW PT'S PAIN GOAL Description: < 4 Outcome: Not Progressing, pain level 7-9

## 2019-05-31 NOTE — Progress Notes (Addendum)
Nurse tech obtained VS prior to ambulation, Resting HR 130. NT notified nurse of increased HR.   Pt. Requests to be toileted. Patient encouraged to stand pivot. Pt. States, "my hips hurt, I can't put weight. 3 person assist to [facility], pt. C/o bilateral hip pain, leg weakness. Refuses bed pan. Pt. Screaming stating discomfort. Denies SOB,Tachypnea noted RR: 24. HR post ambulation 135. Denies C/P. 3 person assist to bed, difficulty standing. Pt. Screaming, grunting. Pain medication given. Patient request to be positioned on right side. Pillow between knees.   Patient resting quietly, eyes closed. Call bell within reach, bed lowest position. Patient has no further request. Will continue to monitor Q15.

## 2019-06-01 ENCOUNTER — Inpatient Hospital Stay (HOSPITAL_COMMUNITY): Payer: 59 | Admitting: Physical Therapy

## 2019-06-01 ENCOUNTER — Inpatient Hospital Stay (HOSPITAL_COMMUNITY): Payer: 59 | Admitting: Occupational Therapy

## 2019-06-01 DIAGNOSIS — D696 Thrombocytopenia, unspecified: Secondary | ICD-10-CM | POA: Diagnosis present

## 2019-06-01 LAB — MAGNESIUM
Magnesium: 1.9 mg/dL (ref 1.7–2.4)
Magnesium: 1.7 mg/dL (ref 1.7–2.4)

## 2019-06-01 LAB — RENAL FUNCTION PANEL
Albumin: 1.9 g/dL — ABNORMAL LOW (ref 3.5–5.0)
Anion gap: 9 (ref 5–15)
BUN: 10 mg/dL (ref 8–23)
CO2: 21 mmol/L — ABNORMAL LOW (ref 22–32)
Calcium: 7.7 mg/dL — ABNORMAL LOW (ref 8.9–10.3)
Chloride: 103 mmol/L (ref 98–111)
Creatinine, Ser: 0.47 mg/dL (ref 0.44–1.00)
GFR calc Af Amer: >60 mL/min (ref >60)
GFR calc non Af Amer: >60 mL/min (ref >60)
Glucose, Bld: 133 mg/dL — ABNORMAL HIGH (ref 70–99)
Phosphorus: <1 mg/dL — CL (ref 2.5–4.6)
Potassium: 4.5 mmol/L (ref 3.5–5.1)
Sodium: 133 mmol/L — ABNORMAL LOW (ref 135–145)

## 2019-06-01 MED ORDER — POTASSIUM PHOSPHATES 15 MMOLE/5ML IV SOLN
40.0000 meq | Freq: Once | INTRAVENOUS | Status: AC
  Administered 2019-06-01: 19:00:00 40 meq via INTRAVENOUS
  Filled 2019-06-01: qty 9.09

## 2019-06-01 MED ORDER — K PHOS MONO-SOD PHOS DI & MONO 155-852-130 MG PO TABS
500.0000 mg | ORAL_TABLET | Freq: Every day | ORAL | Status: DC
  Administered 2019-06-01 – 2019-06-07 (×7): 500 mg via ORAL
  Filled 2019-06-01 (×7): qty 2

## 2019-06-01 NOTE — Progress Notes (Signed)
Lab called to report a critical value of phosphorus of <1. Call placed to Dr. Posey Pronto.

## 2019-06-01 NOTE — Progress Notes (Signed)
Occupational Therapy Session Note  Patient Details  Name: Rebecca Lucas MRN: [id number] Date of Birth: May 04, 1956  Today's Date: 06/01/2019 OT Individual Time: 6010-9323 OT Individual Time Calculation (min): 45 min    Short Term Goals: Week 3:  OT Short Term Goal 1 (Week 3): STG=LTG Due to LOS  Skilled Therapeutic Interventions/Progress Updates:    Treatment session with focus on bed mobility and management of BLE to engage in LB self-care tasks.  Pt received supine in bed reporting incontinent, loose BM.  Engaged in rolling with focus on improved body mechanics to increase participation in rolling and achieve comfortable position in sidelying to allow therapist to complete hygiene.  Pt utilizing bed rails due to increased pain this session.  Pt unable to assist with LB dressing this session, however discussed circle sitting or figure 4 positioning to increase participation in LB dressing.  Discussed modifications of tasks to allow for increased participation based on fluctuating levels of ability.  Grooming completed in bed with setup assist.  Pt reports hopeful for d/c home early next week and has family arranged to provide care as needed.  Pt remained semi-reclined in bed with all needs in reach and RN present.  Therapy Documentation Precautions:  Precautions Precautions: Fall, Back Precaution Comments: reviewed spinal precautions Required Braces or Orthoses: Spinal Brace Spinal Brace: Lumbar corset, Applied in sitting position Restrictions Weight Bearing Restrictions: No General:   Vital Signs: Therapy Vitals Temp: 98.4 F (36.9 C) Pulse Rate: 92 Resp: (!) 24 BP: 135/73 Patient Position (if appropriate): Lying Oxygen Therapy SpO2: 100 % O2 Device: Room Air Pain:  Pt with c/o pain 6/10.  RN notified, arrived to administer pain meds at end of session.   Therapy/Group: Individual Therapy  Simonne Come 06/01/2019, 8:57 AM

## 2019-06-01 NOTE — Progress Notes (Signed)
[place] PHYSICAL MEDICINE & REHABILITATION PROGRESS NOTE   Subjective/Complaints: Patient seen sitting up in bed this morning.  She states she slept well overnight.  She notes improvement in abdominal pain.  She notes she felt hungry for the first time in a long time this morning and was able to eat breakfast.  She does however note loose stools.  ROS: +Loose stools.  Denies CP, shortness of breath.  Objective:   Dg Abd 1 View  Result Date: 05/31/2019 CLINICAL DATA:  Abdominal pain. EXAM: ABDOMEN - 1 VIEW COMPARISON:  05/30/2019. FINDINGS: Previously identified dilated bowel loops over the right abdomen has improved. No prominent bowel distention on today's exam. No free air. Bladder distention cannot be excluded. Prior lumbar spine fusion. Hardware intact. No acute bony abnormality IMPRESSION: 1. Previously identified dilated bowel and has improved. No prominent bowel distention on today's exam. 2.  Bladder distention cannot be excluded. Electronically Signed   By: Marcello Moores  Register   On: 05/31/2019 07:28   Dg Abd 1 View  Result Date: 05/30/2019 CLINICAL DATA:  Acute generalized abdominal pain. EXAM: ABDOMEN - 1 VIEW COMPARISON:  Radiographs of May 29, 2019. CT scan of May 27, 2019. FINDINGS: Probable dilated large or small bowel loop is noted in right lower quadrant, most consistent with focal ileus. No abnormal calcifications are noted. Postsurgical changes are seen involving the lumbar spine. IMPRESSION: Probable dilated large or small bowel loop is noted in right lower quadrant, most consistent with focal ileus. Electronically Signed   By: Marijo Conception M.D.   On: 05/30/2019 13:38   Recent Labs    05/30/19 0433 05/31/19 0455  WBC 10.8* 8.7  HGB 10.0* 9.1*  HCT 30.2* 27.9*  PLT 107* 106*   Recent Labs    05/30/19 0433 05/31/19 0455  NA 130* 132*  K 3.7 3.4*  CL 101 105  CO2 22 20*  GLUCOSE 99 101*  BUN 12 16  CREATININE 0.37* 0.48  CALCIUM 7.2* 7.4*     Intake/Output Summary (Last 24 hours) at 06/01/2019 1232 Last data filed at 06/01/2019 1146 Gross per 24 hour  Intake 555 ml  Output 1925 ml  Net -1370 ml     Physical Exam: Vital Signs Blood pressure 135/73, pulse 92, temperature 98.4 F (36.9 C), resp. rate (!) 24, height 5\' 7"  (1.702 m), weight 59.5 kg, SpO2 100 %. Constitutional: No distress . Vital signs reviewed. HENT: Normocephalic.  Atraumatic. Eyes: EOMI. No discharge. Cardiovascular: No JVD. Respiratory: Normal effort.  No stridor. GI: Distention improving.  Bowel sounds hypoactive, but improving Skin: Warm and dry.  Intact. Psych: Flat. Musc: No edema in extremities.  No tenderness in extremities. Neuro: Alert Motor: Bilateral upper extremities: 5/5 proximal distal Right lower extremity: 3-/5 proximal distal, unchanged (?  Effort) Left lower extremity: 0/5 proximal to distal  Assessment/Plan: 1. 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  Physiatrist is providing close team supervision and 24 hour management of active medical problems listed below.  Physiatrist and rehab team continue to assess barriers to discharge/monitor patient progress toward functional and medical goals  Care Tool:  Bathing  Bathing activity did not occur: Refused Body parts bathed by patient: Right arm, Left arm, Chest, Abdomen, Front perineal area, Right upper leg, Left upper leg, Face, Buttocks, Right lower leg, Left lower leg  Body parts bathed by helper: Buttocks, Right lower leg, Left lower leg     Bathing assist Assist Level: Set up assist     Upper Body Dressing/Undressing Upper body dressing   What is the patient wearing?: Pull over shirt, Orthosis Orthosis activity level: Performed by patient  Upper body assist  Assist Level: Set up assist    Lower Body Dressing/Undressing Lower body dressing      What is the patient wearing?: Underwear/pull up, Pants     Lower body assist Assist for lower body dressing: Minimal Assistance - Patient > 75%     Toileting Toileting Toileting Activity did not occur Landscape architect and hygiene only): Refused  Toileting assist Assist for toileting: Total Assistance - Patient < 25%     Transfers Chair/bed transfer  Transfers assist     Chair/bed transfer assist level: Total Assistance - Patient < 25%     Locomotion Ambulation   Ambulation assist      Assist level: Contact Guard/Touching assist Assistive device: Walker-rolling Max distance: 45'   Walk 10 feet activity   Assist     Assist level: Contact Guard/Touching assist Assistive device: Walker-rolling   Walk 50 feet activity   Assist Walk 50 feet with 2 turns activity did not occur: Safety/medical concerns         Walk 150 feet activity   Assist Walk 150 feet activity did not occur: Safety/medical concerns         Walk 10 feet on uneven surface  activity   Assist Walk 10 feet on uneven surfaces activity did not occur: Safety/medical concerns         Wheelchair     Assist Will patient use wheelchair at discharge?: Yes Type of Wheelchair: Manual Wheelchair activity did not occur: Safety/medical concerns  Wheelchair assist level: Supervision/Verbal cueing Max wheelchair distance: 150    Wheelchair 50 feet with 2 turns activity    Assist    Wheelchair 50 feet with 2 turns activity did not occur: Safety/medical concerns   Assist Level: Supervision/Verbal cueing   Wheelchair 150 feet activity     Assist    Wheelchair 150 feet activity did not occur: Safety/medical concerns   Assist Level: Supervision/Verbal cueing   Medical Problem List and Plan: 1.Decreased functional mobilitysecondary to 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   Cont CIR -Back brace when out of bed. -Plan is to follow-up outpatient Dr. Earlie Server in regards to chemotherapy  Chemotherapy was to begin on 9/17, however now on hold  2. Antithrombotics: -DVT/anticoagulation:SCDs. Lovenox  LE venous dopplers negative for DVT -antiplatelet therapy: N/A 3. Pain Management:  Neurontin 300 mg 3 times daily  Fentanyl patch 75 mcg, Advil 400 mg  Ibuprofen changed to PRN on 9/5, DC'd on 9/17 due to EGD findings and discussion with hospitalist  9/16- oxy is q4 hours prn   Lidoderm patch added on 9/17 to back with improvement 4. Mood:motivational support -antipsychotic agents: N/A 5. Neuropsych: This patientiscapable of making decisions on herown behalf. 6. Skin/Wound Care:Routine skin checks 7. Fluids/Electrolytes/Nutrition:Routine in and outs 8. Acute blood loss anemia.   Hemoglobin 9.1 on 9/18, labs ordered for Monday  Continue to await further GI recs  Continue to monitor 9. Leukocytosis secondary to steroids. Continue to monitor  WBCs 8.7 on 9/18  Afebrile  Continue to monitor 10. Urinary retention/acute lower UTI:  Keflex, started on 9/13, changed to [place] on 9/17, plan to DC tomorrow 11. Constipation in setting of Neurogenic bowel and opioid-induced constipation. Adjust bowel program as needed  Bowel program with dig stim at night/evening.   Linzess changed to [place] on 9/17  Miralax DC'd  Appears to be improving  KUB personally reviewed, showing improvement since suggestion of ileus on 9/17  Improving on 9/19-.  Tolerated bowel meds.  May need to reduce frequency of  Movantik. 12.  Steroid-induced hyperglycemia  Continue to monitor  Relatively controlled on 9/17 13. Severe abd pain  CT showed ascites and gallbladder sludge- GI consulted- ordered U/S guided paracentesis- GI ordered associated labs    EGD performed by GI, appreciate recs  PPI twice daily x8 weeks then daily as long as patient requires steroids  Await cytology results  Hospitalist following, appreciate recs, notes reviewed  Discussed with Hospitalist potential contributers, abx changes, linzess changed to movantik  Diet changed back to regular diet after being on liquids on 9/17  Continues to improve, now with appetite 14.  Sleep disturbance  Trazodone 50 nightly as needed  15.  Hyponatremia  Sodium 132 on 9/18, labs ordered for Monday  Continue to monitor 16.  Transaminitis  LFTs elevated on 9/18, labs ordered for Monday  Continue to monitor 17.  Oral candidiasis  Noted on EGD  Diflucan 100 mg daily x7 days per GI, started on 9/16 18.  Tachycardia  Likely secondary to above  ECG reviewed, limited  Low-dose metoprolol started on 9/18  First troponin borderline negative, repeat negative  Improving 19.  Hypokalemia  Potassium 3.4 on 9/18, labs ordered for Monday  Supplement initiated  Magnesium 1.9 on 9/19 20.  Thrombocytopenia  Platelets 106 on 9/18, labs ordered for tomorrow  HIT ordered  LOS: 18 days A FACE TO FACE EVALUATION WAS PERFORMED  Ankit Lorie Phenix 06/01/2019, 12:32 PM

## 2019-06-01 NOTE — Progress Notes (Signed)
Pharmacy Heparin Induced Thrombocytopenia (HIT) Note:  Rebecca Lucas is an 63 y.o. female being evaluated for HIT. Lovenox was started 9/10 for VTE prophylaxis, and baseline platelets were 228.  HIT labs were ordered on 9/19 when platelets dropped to 106 (initial drop 9/16 to 116) .  Auto-populate labs: No results found for: HEPINDPLTAB, SRALOWDOSEHP, SRAHIGHDOSEH    CALCULATE SCORE:  4Ts (see the HIT Algorithm) Score  Thrombocytopenia 1  Timing 2  Thrombosis 0  Other causes of thrombocytopenia 0  Total 3     Recommendations (A or B or C) are based on available lab results:  A. No lab results available (HIT antibody and/or SRA ordered): -Low HIT probability: consider discontinuing HIT labs, continue heparin/LMWH, SRA not recommended; no heparin allergy documentation needed   B. HIT antibody result available: -N/A - HIT antibody not available, and/or SRA not available  C. SRA result available -SRA not available  Name of MD Contacted: N/A  Plan (Discussed with provider) Labs ordered: -Heparin antibody Heparin allergy: -documented or updated Anticoagulation plans -continue heparin / LMWH   Comments: It is debateble whether or not patient would score a 1 or 2 for thrombocytopenia due to recent EGD with biopsy. Since this is borderline, we will continue with checking the HIT antibody but leave the prophylactic lovenox. Heparin allergy added and will update with results.  Kadijah Shamoon, Rande Lawman 06/01/2019, 12:58 PM

## 2019-06-01 NOTE — Progress Notes (Addendum)
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 Orson Slick.She had acute blood loss anemia and she was subsequently transfused. Patient was admitted for inpatient rehab and was doing well until this weekend.  Patient reports progressive abdominal pain and distention for 3 days associated with decreased appetite and nausea. She had CT abdomen/pelvis on 05/27/2019 which showed interval development of small to moderate volume of ascites &Gallbladder sludge. GI was consulted who recommended ultrasound-guided paracentesis by IR,Protonix 40 mg IV twice daily, Carafate 1 g 3 times daily and EGD-however IRdeferred theparacentesis aslimited ultrasound did not show fluid.   Dr. Dagoberto Ligas (physical medicine and rehab MD)consulted [facility] for further evaluation and management of her abdominal distention and pain.  She underwent EGD (05/29/2019) and per report was noted to have whitish patches throughout the esophagus suspicious for candidiasis, cytology brushing obtained.  Multiple nonbleeding cratered, linear and superficial gastric ulcers some with pigmented material and some with clean bases found in the gastric fundus, body,  greater curvature of the stomach, lesser curvature of the stomach incisura and gastric antrum, biopsies taken. Erythematous gastric antrum, biopsies taken for H. Pylori. GI recommended PPI twice daily for 8 weeks followed by once a day as long as patient needs any NSAIDs and steroid.  Also started her on Diflucan 100 mg p.o. daily for 7 days.  06/01/2019: Patient seen.  Abdominal pain has resolved.  Patient reported 2 episodes of loose stool, but was on MiraLAX.  MiraLAX is currently on hold.  Bilateral lower extremity weakness persists.  No abdominal pain or distention reported.   Assessment & Plan:   Principal Problem:   Abdominal pain Active Problems:   Cauda equina syndrome (HCC)   Foot drop, left   Neurogenic bowel   Bandemia   Steroid-induced hyperglycemia   Leucocytosis   Acute blood loss anemia   Postoperative pain   Abdominal distension   Ascites   Therapeutic opioid induced constipation   Oral candidiasis   Transaminitis   Hyponatremia   Acute lower UTI   Cancer related pain   Hypokalemia   Sinus tachycardia  GeneralizedAbdominal Pain & Distention: -Unknown Etiology- IR tried to perform paracentesis howeverparacentesis was deferred asLimited US revealed No fluid. -CT abdomen/pelvis from 05/27/2019 revealed mild to moderate ascites and sludge in gallbladder.X-ray KUB on 9/14 showed no acute findings -Patient continued to complain of abdominal pain, distention associated withbloating,nausea and decreased appetite -GI consulted, patient underwent EGD with findings as mentioned above showing gastric ulceration with white patches throughout the esophagus. -Continue Protonix 40 mg twice daily, Carafate 1 g 3 times, Carafate daily as per GI recommendation -Continue pain meds per primary.  Avoid NSAIDs if possible. -Lipase level normal.  Acute hepatitis panel negative.   05/31/2019: KUB yesterday showed probable dilated large or small bowel loop in the right lower  quadrant consistent with  focal ileus. -Started on Movantik per primary team.  She says she had 2 bowel movements and says that her abdominal pain is much better.  KUB today showing some improvement. 06/01/2019: Resolved.  EGD findings noted.  Patient is currently on PPI and Diflucan.  Patient reported 2 loose stools earlier today.  Will discontinue Movantik and Senokot.   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 -Supposed to restart chemo on 05/30/2019 outpatient. -PT is on board/ -She has weakness with debility left greater than right. 06/01/2019: Bilateral lower extremity weakness persists.  Leukocytosis: -Likely secondary to steroid use -WBC count is trending down patient is afebrile. -WBC count: 24.6 on 9/7, 11.6 on 9/16; 10.8 on 9/17; 8.7on 9/18.  ChronicConstipation: -Secondary to metastatic spinal tumor versus pain medication - Previously on Dulcolax suppository 10 mg daily, MiraLAX and Linzess -Movantik added per primary.  She says she had 2 BMs and feels pressure. 06/01/2019: Reported 2 loose stools earlier today.  Discontinue Senokot and Movantik.  UTI: -Urine culture came back positive for E. Coli- Keflex(4 days) and then switched to Macrobid per the primary team due to concern for onset of abdominal pain around the time Keflex was initiated. -Do not think the abdominal pain was related to the antibiotic. -She denies having any urinary symptoms at this time.  Would recommend to discontinue the antibiotic after today's dose. 06/01/2019: Will defer duration of antibiotics to the primary team.  Acute blood loss anemia: -Patient received3 units PRBC08/26/2020  -H&H is stable-no signs of bleeding.  Thrombocytopenia: -Patient noted to have low platelet count.  Exact etiology unclear at this time. -She is on subcutaneous Lovenox for DVT prophylaxis. If platelet count continuing to be low then we may need to order HIT panel and further work-up to evaluate. 06/01/2019: Consider checking HIT, but will defer to the primary team.  CBC in the morning.  Hypokalemia: -Potassium replacement ordered.  Continue to monitor and replace as needed.  We will also order magnesium level. 06/01/2019: Last renal panel was on 05/31/2019.  We will repeat renal panel.   Subjective: Loose stools x 2, otherwise, no new complaints  Objective: Vitals:   05/31/19 0447 05/31/19 1323 05/31/19 2120 06/01/19 0507  BP:  136/76 (!) 114/55 135/73  Pulse: (!) 129 (!) 109 97 92  Resp:  16 16 (!) 24  Temp:  98.5 F (36.9 C) 98.7 F (37.1 C) 98.4 F (36.9 C)  TempSrc:  Oral Oral   SpO2:  96% 99% 100%  Weight:    59.5 kg  Height:        Intake/Output Summary (Last 24 hours) at 06/01/2019 1051 Last data filed at 06/01/2019 0900 Gross per 24 hour  Intake 555 ml  Output 1325 ml  Net -770 ml   Filed Weights   05/27/19 0414 05/29/19 0500 06/01/19 0507  Weight: 56 kg 57.9 kg 59.5 kg    Examination:  General exam: Awake and oriented, not in any acute distress at this time Respiratory system: Clear to auscultation. Cardiovascular system: S1 & S2. No murmur. Gastrointestinal system: No abdominal distention.  Soft and nontender.   Central nervous system: Alert and oriented.  Power both lower extremities is 0/5  Extremities: No leg edema.  Data Reviewed: I have personally reviewed following labs and imaging studies  CBC: Recent Labs  Lab 05/27/19 0420 05/29/19 0428 05/30/19 0433 05/31/19 0455  WBC 17.8* 11.6* 10.8* 8.7  NEUTROABS 16.5* 10.9* 10.1*  --   HGB 10.7* 10.3* 10.0* 9.1*  HCT 33.2* 29.6* 30.2* 27.9*  MCV 98.5 95.8 96.2 97.6  PLT 203 116* 107* 106*    Basic Metabolic Panel: Recent Labs  Lab 05/27/19 0420 05/29/19 0428 05/30/19 0433 05/31/19 0455 06/01/19 0305  NA 135 129* 130* 132*  --   K 4.2 3.5 3.7 3.4*  --   CL 101 99 101 105  --   CO2 23 22 22  20*  --   GLUCOSE 93 119* 99 101*  --   BUN 18 12 12 16   --   CREATININE 0.57 0.43* 0.37* 0.48  --   CALCIUM 8.5* 7.5* 7.2* 7.4*  --   MG  --   --   --   --  1.9   GFR: Estimated Creatinine Clearance: 69.4 mL/min (by C-G formula based on SCr of 0.48 mg/dL). Liver Function Tests: Recent Labs  Lab 05/27/19 0420 05/29/19 0428 05/30/19 0433 05/31/19 0455  AST 39 35 55* 53*  ALT 89* 61* 74* 78*  ALKPHOS 118 104 151* 172*  BILITOT 0.5 0.5 0.4 0.5  PROT 5.7* 4.9* 4.7* 4.6*  ALBUMIN 2.5* 2.1* 2.0* 2.0*   Recent Labs  Lab 05/29/19 1559  LIPASE 36   No results for input(s): AMMONIA in the last 168 hours. Coagulation Profile: No results for input(s): INR, PROTIME in the last 168 hours. Cardiac Enzymes: No results for input(s): CKTOTAL, CKMB, CKMBINDEX, TROPONINI in the last 168 hours. BNP (last 3 results) No results for input(s): PROBNP in the last 8760 hours. HbA1C: No results for input(s): HGBA1C in the last 72 hours. CBG: No results for input(s): GLUCAP in the last 168 hours. Lipid Profile: No results for input(s): CHOL, HDL, LDLCALC, TRIG, CHOLHDL, LDLDIRECT in the last 72 hours. Thyroid Function Tests: No results for input(s): TSH, T4TOTAL, FREET4, T3FREE, THYROIDAB in the last 72 hours. Anemia Panel: No results for input(s): VITAMINB12, FOLATE, FERRITIN, TIBC, IRON, RETICCTPCT in the last 72 hours. Sepsis Labs: No results for input(s): PROCALCITON, LATICACIDVEN in the last 168 hours.  Recent Results (from the past 240 hour(s))  Urine Culture     Status: Abnormal   Collection Time: 05/25/19  1:28 PM   Specimen: Urine, Catheterized  Result Value Ref Range Status   Specimen Description URINE, CATHETERIZED  Final   Special Requests   Final    NONE Performed at  [facility], [address], [place], [place] [address]    Culture >=100,000 COLONIES/mL ESCHERICHIA COLI (A)  Final   Report Status 05/27/2019 FINAL  Final   Organism ID, Bacteria ESCHERICHIA COLI (A)  Final      Susceptibility   Escherichia coli - MIC*    AMPICILLIN <=2 SENSITIVE Sensitive     CEFAZOLIN <=4 SENSITIVE Sensitive     CEFTRIAXONE <=1 SENSITIVE Sensitive     CIPROFLOXACIN <=0.25 SENSITIVE Sensitive     GENTAMICIN <=1 SENSITIVE Sensitive     IMIPENEM <=0.25 SENSITIVE Sensitive     NITROFURANTOIN <=16 SENSITIVE Sensitive     TRIMETH/SULFA <=20 SENSITIVE Sensitive     AMPICILLIN/SULBACTAM <=2 SENSITIVE Sensitive     PIP/TAZO <=4 SENSITIVE Sensitive     Extended ESBL NEGATIVE Sensitive     * >=100,000 COLONIES/mL ESCHERICHIA COLI  SARS Coronavirus 2 [facility] order, Performed in [facility] hospital lab) Nasopharyngeal Nasopharyngeal Swab  Status: None   Collection Time: 05/28/19  4:05 PM   Specimen: Nasopharyngeal Swab  Result Value Ref Range Status   SARS Coronavirus 2 NEGATIVE NEGATIVE Final    Comment: (NOTE) If result is NEGATIVE SARS-CoV-2 target nucleic acids are NOT DETECTED. The SARS-CoV-2 RNA is generally detectable in upper and lower  respiratory specimens during the acute phase of infection. The lowest  concentration of SARS-CoV-2 viral copies this assay can detect is 250  copies / mL. A negative result does not preclude SARS-CoV-2 infection  and should not be used as the sole basis for treatment or other  patient management decisions.  A negative result may occur with  improper specimen collection / handling, submission of specimen other  than nasopharyngeal swab, presence of viral mutation(s) within the  areas targeted by this assay, and inadequate number of viral copies  (<250 copies / mL). A negative result must be combined with clinical  observations, patient history, and epidemiological information. If result is POSITIVE SARS-CoV-2 target  nucleic acids are DETECTED. The SARS-CoV-2 RNA is generally detectable in upper and lower  respiratory specimens dur ing the acute phase of infection.  Positive  results are indicative of active infection with SARS-CoV-2.  Clinical  correlation with patient history and other diagnostic information is  necessary to determine patient infection status.  Positive results do  not rule out bacterial infection or co-infection with other viruses. If result is PRESUMPTIVE POSTIVE SARS-CoV-2 nucleic acids MAY BE PRESENT.   A presumptive positive result was obtained on the submitted specimen  and confirmed on repeat testing.  While 2019 novel coronavirus  (SARS-CoV-2) nucleic acids may be present in the submitted sample  additional confirmatory testing may be necessary for epidemiological  and / or clinical management purposes  to differentiate between  SARS-CoV-2 and other Sarbecovirus currently known to infect humans.  If clinically indicated additional testing with an alternate test  methodology [phone]) is advised. The SARS-CoV-2 RNA is generally  detectable in upper and lower respiratory sp ecimens during the acute  phase of infection. The expected result is Negative. Fact Sheet for Patients:  StrictlyIdeas.no Fact Sheet for Healthcare Providers: BankingDealers.co.za This test is not yet approved or cleared by the [place] FDA and has been authorized for detection and/or diagnosis of SARS-CoV-2 by FDA under an Emergency Use Authorization (EUA).  This EUA will remain in effect (meaning this test can be used) for the duration of the COVID-19 declaration under Section 564(b)(1) of the Act, 21 U.S.C. section 360bbb-3(b)(1), unless the authorization is terminated or revoked sooner. Performed at [facility], [place] [address] [place], [place] [address]          Radiology Studies: Dg Abd 1 View  Result Date: 05/31/2019 CLINICAL  DATA:  Abdominal pain. EXAM: ABDOMEN - 1 VIEW COMPARISON:  05/30/2019. FINDINGS: Previously identified dilated bowel loops over the right abdomen has improved. No prominent bowel distention on today's exam. No free air. Bladder distention cannot be excluded. Prior lumbar spine fusion. Hardware intact. No acute bony abnormality IMPRESSION: 1. Previously identified dilated bowel and has improved. No prominent bowel distention on today's exam. 2.  Bladder distention cannot be excluded. Electronically Signed   By: Marcello Moores  Register   On: 05/31/2019 07:28   Dg Abd 1 View  Result Date: 05/30/2019 CLINICAL DATA:  Acute generalized abdominal pain. EXAM: ABDOMEN - 1 VIEW COMPARISON:  Radiographs of May 29, 2019. CT scan of May 27, 2019. FINDINGS: Probable dilated large or small  bowel loop is noted in right lower quadrant, most consistent with focal ileus. No abnormal calcifications are noted. Postsurgical changes are seen involving the lumbar spine. IMPRESSION: Probable dilated large or small bowel loop is noted in right lower quadrant, most consistent with focal ileus. Electronically Signed   By: Marijo Conception M.D.   On: 05/30/2019 13:38        Scheduled Meds: . acetaminophen  650 mg Oral Q6H  . bisacodyl  10 mg Rectal Daily  . calcium-vitamin D   Oral BID  . Chlorhexidine Gluconate Cloth  6 each Topical Q0600  . dexamethasone  3 mg Oral Daily  . enoxaparin (LOVENOX) injection  40 mg Subcutaneous Q24H  . fentaNYL  1 patch Transdermal Q72H  . fluconazole  100 mg Oral Daily  . gabapentin  300 mg Oral TID  . lidocaine  1 patch Transdermal Q24H  . metoprolol tartrate  12.5 mg Oral BID  . naloxegol oxalate  12.5 mg Oral Daily  . nitrofurantoin (macrocrystal-monohydrate)  100 mg Oral Q12H  . pantoprazole  40 mg Oral BID  . polyethylene glycol  17 g Oral BID  . potassium chloride  20 mEq Oral Daily  . potassium chloride  40 mEq Oral Once  . senna-docusate  1 tablet Oral BID  . sucralfate   1 g Oral TID WC & HS   Continuous Infusions: . sodium chloride 75 mL/hr at 06/01/19 0452     LOS: 18 days    Bonnell Public, MD [facility] Pager on amion  If 7PM-7AM, please contact night-coverage [email or web address] Password [facility] 06/01/2019, 10:51 AM

## 2019-06-01 NOTE — Progress Notes (Signed)
Physical Therapy Session Note  Patient Details  Name: Rebecca Lucas MRN: [id number] Date of Birth: 04-09-1956  Today's Date: 06/01/2019 PT Individual Time: 9977-4142; 3953-2023 PT Individual Time Calculation (min): 45 min and 25 min  Short Term Goals: Week 3:  PT Short Term Goal 1 (Week 3): Pt will complete transfers with mod A consistently PT Short Term Goal 2 (Week 3): Pt will perform sit to stand with mod A PT Short Term Goal 3 (Week 3): Pt will tolerate sitting up in chair x 1 hour  Skilled Therapeutic Interventions/Progress Updates:    Session 1: Pt received seated in recliner in room having just finished lunch, agreeable to PT session. Pt has ongoing R hip and low back pain this date, not rated and declines intervention. Introduced Warehouse manager transfer to pt, pt agreeable to attempt. Slide board transfer recliner to w/c with total A due to shorts riding up and pt's skin getting stuck on slide board. Sit to stand with max A to stedy from w/c. Stedy transfer w/c to bed. Sit to supine mod A for BLE management. Rolling L/R with min to mod A for dependent change from shorts to longer pants. Pt has incontinence of bowel, dependent for pericare and brief change. Pt left sidelying on L side in bed with needs in reach at end of session. Will attempt slide board transfer with longer pants next session.  Session 2: Pt received sidelying in bed, agreeable to PT session. Intermittent complaints of pain low back pain and R hip pain with movement, not rated and declines intervention. Sidelying to sitting EOB with max A for BLE management and trunk control. Slide board transfer bed to w/c with min A going downhill, improved transfer technique with longer pants on. Manual w/c propulsion x 150 ft with use of BUE and Supervision for global endurance training. Slide board transfer w/c to level bed with mod A. Sit to supine mod A for BLE management. Pt left sidelying in bed with needs in reach at end of  session.  Therapy Documentation Precautions:  Precautions Precautions: Fall, Back Precaution Comments: reviewed spinal precautions Required Braces or Orthoses: Spinal Brace Spinal Brace: Lumbar corset, Applied in sitting position Restrictions Weight Bearing Restrictions: No    Therapy/Group: Individual Therapy   Excell Seltzer, PT, DPT  06/01/2019, 2:32 PM

## 2019-06-02 ENCOUNTER — Inpatient Hospital Stay (HOSPITAL_COMMUNITY): Payer: 59 | Admitting: Occupational Therapy

## 2019-06-02 ENCOUNTER — Inpatient Hospital Stay (HOSPITAL_COMMUNITY): Payer: 59

## 2019-06-02 LAB — HEPARIN INDUCED PLATELET AB (HIT ANTIBODY): Heparin Induced Plt Ab: 0.051 OD (ref 0.000–0.400)

## 2019-06-02 LAB — CBC WITH DIFFERENTIAL/PLATELET
Abs Immature Granulocytes: 0.59 10*3/uL — ABNORMAL HIGH (ref 0.00–0.07)
Basophils Absolute: 0 10*3/uL (ref 0.0–0.1)
Basophils Relative: 0 %
Eosinophils Absolute: 0 10*3/uL (ref 0.0–0.5)
Eosinophils Relative: 0 %
HCT: 25.1 % — ABNORMAL LOW (ref 36.0–46.0)
Hemoglobin: 8.2 g/dL — ABNORMAL LOW (ref 12.0–15.0)
Immature Granulocytes: 5 %
Lymphocytes Relative: 3 %
Lymphs Abs: 0.4 10*3/uL — ABNORMAL LOW (ref 0.7–4.0)
MCH: 32.3 pg (ref 26.0–34.0)
MCHC: 32.7 g/dL (ref 30.0–36.0)
MCV: 98.8 fL (ref 80.0–100.0)
Monocytes Absolute: 0.5 10*3/uL (ref 0.1–1.0)
Monocytes Relative: 4 %
Neutro Abs: 11 10*3/uL — ABNORMAL HIGH (ref 1.7–7.7)
Neutrophils Relative %: 88 %
Platelets: 126 10*3/uL — ABNORMAL LOW (ref 150–400)
RBC: 2.54 MIL/uL — ABNORMAL LOW (ref 3.87–5.11)
RDW: 17.2 % — ABNORMAL HIGH (ref 11.5–15.5)
WBC: 12.6 10*3/uL — ABNORMAL HIGH (ref 4.0–10.5)
nRBC: 0 % (ref 0.0–0.2)

## 2019-06-02 LAB — PHOSPHORUS: Phosphorus: 2.2 mg/dL — ABNORMAL LOW (ref 2.5–4.6)

## 2019-06-02 MED ORDER — METHOCARBAMOL 500 MG PO TABS
500.0000 mg | ORAL_TABLET | Freq: Three times a day (TID) | ORAL | Status: DC | PRN
  Administered 2019-06-02 – 2019-06-07 (×11): 500 mg via ORAL
  Filled 2019-06-02 (×11): qty 1

## 2019-06-02 NOTE — Progress Notes (Signed)
Occupational Therapy Session Note  Patient Details  Name: Rebecca Lucas MRN: [id number] Date of Birth: [birth year]  Today's Date: 06/02/2019 OT Individual Time: 1045-1100 and 1400-1430 OT Individual Time Calculation (min): 15 min and 30 min OT Missed Time: 30 min (pain)   Short Term Goals: Week 3:  OT Short Term Goal 1 (Week 3): STG=LTG Due to LOS  Skilled Therapeutic Interventions/Progress Updates:    Therapist arrived at 8:00 for scheduled OT session. Pt rating 10/10 pain in obliques and back. Pt reports still awaiting scheduled 8:00 pain meds and declining any therapy until that time. Will re-attempt to see pt later in day when pain is better controlled. RN made aware.   Session One: Therapist returned at 1045, pt in side-lying and reporting pain as better managed and agreeable to getting OOB.  She transferred to sitting EOB with min A and VCs for technique with increased time. Completed mod-max A slide board transfer EOB>recliner with VCs for technique.  Pt left seated in recliner at end of session, positioned with pillows for comfort and all needs in reach.   Session Two: Pt seen for OT session focusing on functional transfers and mobility. Pt in side-lying upon arrival, agreeable to tx session. She reports feeling better with new muscle relaxer medication. RN also administered pain med at start of session with scheduled dose. Pt transferred to sitting EOB with mod A using bed rails and VCs for technique. Completed sliding board transfer EOB>drop arm BSC, min A overall with VCs for technique. Pt declined practicing clothing management and no need to void. Min A slide board from BSC>w/c. With encouragement, pt willing to stay sitting up in w/c at end of session. Left with all needs in reach. Pt provided with w/c home measurement sheet in prep for d/c. Discussed possibility of hospital bed for home use to increase independence with bed mobility and transfers, pt willing to consider.  Education throughout session regarding OT/PT goals, activity progression, planning for days with more fatigue/pain and d/c planning.   Therapy Documentation Precautions:  Precautions Precautions: Fall, Back Precaution Comments: reviewed spinal precautions Required Braces or Orthoses: Spinal Brace Spinal Brace: Lumbar corset, Applied in sitting position Restrictions Weight Bearing Restrictions: No   Therapy/Group: Individual Therapy  Ming Mcmannis L 06/02/2019, 6:33 AM

## 2019-06-02 NOTE — Progress Notes (Signed)
[place] PHYSICAL MEDICINE & REHABILITATION PROGRESS NOTE   Subjective/Complaints: Patient seen laying in bed this morning.  He states he slept well overnight, except for IV beeping.  She notes significant improvement in bowel function and abdominal pain.  She does note back pain.  ROS: + Back pain.  Denies CP, shortness of breath.  Objective:   No results found. Recent Labs    05/31/19 0455 06/02/19 0407  WBC 8.7 12.6*  HGB 9.1* 8.2*  HCT 27.9* 25.1*  PLT 106* 126*   Recent Labs    05/31/19 0455 06/01/19 1643  NA 132* 133*  K 3.4* 4.5  CL 105 103  CO2 20* 21*  GLUCOSE 101* 133*  BUN 16 10  CREATININE 0.48 0.47  CALCIUM 7.4* 7.7*    Intake/Output Summary (Last 24 hours) at 06/02/2019 1123 Last data filed at 06/02/2019 0750 Gross per 24 hour  Intake 640 ml  Output 2645 ml  Net -2005 ml     Physical Exam: Vital Signs Blood pressure 128/72, pulse (!) 108, temperature 99.1 F (37.3 C), temperature source Oral, resp. rate 18, height 5\' 7"  (1.702 m), weight 61.9 kg, SpO2 97 %. Constitutional: No distress . Vital signs reviewed. HENT: Normocephalic.  Atraumatic. Eyes: EOMI. No discharge. Cardiovascular: No JVD. Respiratory: Normal effort.  No stridor. GI: Distention continues to improve. Skin: Warm and dry.  Intact. Psych: Flat. Musc: No edema in extremities.  No tenderness in extremities. Neuro: Alert Motor: Bilateral upper extremities: 5/5 proximal distal Right lower extremity: 3-/5 proximal distal, unchanged, unchanged Left lower extremity: 0/5 proximal to distal, unchanged  Assessment/Plan: 1. 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  Physiatrist is providing close team supervision and 24 hour management of active medical problems listed  below.  Physiatrist and rehab team continue to assess barriers to discharge/monitor patient progress toward functional and medical goals  Care Tool:  Bathing  Bathing activity did not occur: Refused Body parts bathed by patient: Right arm, Left arm, Chest, Abdomen, Front perineal area, Right upper leg, Left upper leg, Face, Buttocks, Right lower leg, Left lower leg   Body parts bathed by helper: Buttocks, Right lower leg, Left lower leg     Bathing assist Assist Level: Set up assist     Upper Body Dressing/Undressing Upper body dressing   What is the patient wearing?: Pull over shirt, Orthosis Orthosis activity level: Performed by patient  Upper body assist Assist Level: Set up assist    Lower Body Dressing/Undressing Lower body dressing      What is the patient wearing?: Underwear/pull up, Pants     Lower body assist Assist for lower body dressing: Minimal Assistance - Patient > 75%     Toileting Toileting Toileting Activity did not occur Landscape architect and hygiene only): Refused  Toileting assist Assist for toileting: Total Assistance - Patient < 25%     Transfers Chair/bed transfer  Transfers assist     Chair/bed transfer assist level: Moderate Assistance - Patient 50 - 74%     Locomotion Ambulation   Ambulation assist      Assist level: Contact Guard/Touching assist Assistive device: Walker-rolling Max distance: 45'   Walk 10 feet activity   Assist     Assist level: Contact Guard/Touching assist Assistive device: Walker-rolling   Walk 50 feet activity   Assist  Walk 50 feet with 2 turns activity did not occur: Safety/medical concerns         Walk 150 feet activity   Assist Walk 150 feet activity did not occur: Safety/medical concerns         Walk 10 feet on uneven surface  activity   Assist Walk 10 feet on uneven surfaces activity did not occur: Safety/medical concerns         Wheelchair     Assist Will  patient use wheelchair at discharge?: Yes Type of Wheelchair: Manual Wheelchair activity did not occur: Safety/medical concerns  Wheelchair assist level: Supervision/Verbal cueing Max wheelchair distance: 150    Wheelchair 50 feet with 2 turns activity    Assist    Wheelchair 50 feet with 2 turns activity did not occur: Safety/medical concerns   Assist Level: Supervision/Verbal cueing   Wheelchair 150 feet activity     Assist    Wheelchair 150 feet activity did not occur: Safety/medical concerns   Assist Level: Supervision/Verbal cueing   Medical Problem List and Plan: 1.Decreased functional mobilitysecondary to 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   Continue CIR -Back brace when out of bed. -Plan is to follow-up outpatient Dr. Earlie Server in regards to chemotherapy  Chemotherapy was to begin on 9/17, however now on hold  2. Antithrombotics: -DVT/anticoagulation:SCDs. Lovenox  LE venous dopplers negative for DVT -antiplatelet therapy: N/A 3. Pain Management:  Neurontin 300 mg 3 times daily  Continue patch 75 mcg, Advil 400 mg  Ibuprofen changed to PRN on 9/5, DC'd on 9/17 due to EGD findings and discussion with hospitalist  9/16- oxy is q4 hours prn   Lidoderm patch added on 9/17 to back with now mild improvement  Robaxin 500 3 times daily as needed started on 9/20 4. Mood:motivational support -antipsychotic agents: N/A 5. Neuropsych: This patientiscapable of making decisions on herown behalf. 6. Skin/Wound Care:Routine skin checks 7. Fluids/Electrolytes/Nutrition:Routine in and outs  P.o. intake  improving, IVF DC'd on 9/20 8. Acute blood loss anemia.   Hemoglobin 8.2 on 9/20, labs ordered for tomorrow  Hemoccult ordered  Continue to await further GI recs  Continue to monitor 9. Leukocytosis secondary to steroids. Continue to monitor  WBCs 12.6 on 9/20  Afebrile  Continue to monitor 10. Urinary retention/acute lower UTI:  Keflex, started on 9/13, changed to [place] on 9/17, [place] on 9/20 11. Constipation in setting of Neurogenic bowel and opioid-induced constipation. Adjust bowel program as needed  Bowel program with dig stim at night/evening.   Linzess changed to [place] on 9/17, [place] by hospitalist-may need to resume  Miralax DC'd  Appears to be improving  KUB personally reviewed, showing improvement since suggestion of ileus on 9/17  Improving on 9/20. 12.  Steroid-induced hyperglycemia  Continue to monitor  Elevated on 9/20 13. Severe abd pain  CT showed ascites and gallbladder sludge- GI consulted- ordered U/S guided paracentesis- GI ordered associated labs    EGD performed by GI, appreciate recs  PPI twice daily x8 weeks then daily as long as patient requires steroids  Await cytology results  Hospitalist following, appreciate recs, notes reviewed  Diet changed back to regular diet after being on liquids on 9/17  Improved 14.  Sleep disturbance  Trazodone 50 nightly as needed  15.  Hyponatremia  Sodium 133 on  9/19, labs ordered for tomorrow  Continue to monitor 16.  Transaminitis  LFTs elevated on 9/18, labs ordered for tomorrow  Continue to monitor 17.  Oral candidiasis  Noted on EGD  Diflucan 100 mg daily x7 days per GI, started on 9/16 18.  Tachycardia  Likely secondary to pain  ECG reviewed, limited  Low-dose metoprolol started on 9/18  First troponin borderline negative, repeat negative 19.  Hypokalemia  Potassium 4.5 on 9/20, labs ordered for tomorrow  Supplement initiated, [place] on 9/20  Magnesium 1.7 on 9/20 20.  Thrombocytopenia  Platelets  126 on 9/20  HIT pending 21.  Hypophosphatemia  Noted to have critical value, supplement initiated  Repeat labs pending, also will ordered for tomorrow  LOS: 19 days A FACE TO FACE EVALUATION WAS PERFORMED  Ankit Lorie Phenix 06/02/2019, 11:23 AM

## 2019-06-02 NOTE — Progress Notes (Signed)
Physical Therapy Session Note  Patient Details  Name: Rebecca Lucas MRN: [id number] Date of Birth: May 06, 1956  Today's Date: 06/02/2019 PT Individual Time: 4436-0165 PT Individual Time Calculation (min): 45 min   Short Term Goals: Week 1:  PT Short Term Goal 1 (Week 1): Pt will be able to complete transfers with min A consistently PT Short Term Goal 1 - Progress (Week 1): Met PT Short Term Goal 2 (Week 1): Pt will ambulate x 50 ft with LRAD and mod A PT Short Term Goal 2 - Progress (Week 1): Met PT Short Term Goal 3 (Week 1): Pt will initiate w/c mobility PT Short Term Goal 3 - Progress (Week 1): Met Week 2:  PT Short Term Goal 1 (Week 2): =LTG due to ELOS PT Short Term Goal 1 - Progress (Week 2): Progressing toward goal Week 3:  PT Short Term Goal 1 (Week 3): Pt will complete transfers with mod A consistently PT Short Term Goal 2 (Week 3): Pt will perform sit to stand with mod A PT Short Term Goal 3 (Week 3): Pt will tolerate sitting up in chair x 1 hour Week 4:     Skilled Therapeutic Interventions/Progress Updates:   Pain  Pt stated pain was much better now that she had received pain meds, average 5/10 during session low back, repositioned as needed and treatment to tolerance.    Pt initially sidelying in bed, requesting to use BSC Rolling L and R w/mod assist for lower body mgmnt and using bed rails Therapist donned pants total assist. Supine to side to sit on edge of bed w/mod assist, use of bedrails, and cues for sequencing. Static sit on edge of bed initially w/min assist, progressed to cga as pt adjusted to upright.  [place] brace donned in sitting by therapist. Bed to bsc using sliding board, cues for sequencing, total assist for set up, mod assist of 1 for transfer. Pt able to boost on bsc and therapist lowered pants and brief total assist.  Same assist for raising brief/pants.  Pt sat on commode and performed digital stim w/no BM produced. Pt able to perform hygiene  following attempt w/set up only.  bsc to wc via sliding board w/mod assist, verbal cues for sequencing and head hips relationship, set up assist.  wc propulsion 175f w/bilat UE's for UE strengthening and cardiovascular endurance., supervision only.  wc to bed via SB transfer w/set up assist, cues for sequencing/mechanics, mod assist. Sit to supine w/mod assist using rails. Pt repositioned in s/l for comfort w/mod assist.   Pt left w/ rails up x 3, needs in reach, bed alarm set.  Improving w/sliding board transfers.  Limited activity tolerance and endurance.   Therapy Documentation Precautions:  Precautions Precautions: Fall, Back Precaution Comments: reviewed spinal precautions Required Braces or Orthoses: Spinal Brace Spinal Brace: Lumbar corset, Applied in sitting position Restrictions Weight Bearing Restrictions: No    Therapy/Group: Individual Therapy  BCallie Fielding PColwich9/20/2020, 10:45 AM

## 2019-06-02 NOTE — Plan of Care (Signed)
  Problem: Consults Goal: RH SPINAL CORD INJURY PATIENT EDUCATION Description:  See Patient Education module for education specifics.  Outcome: Progressing Goal: Skin Care Protocol Initiated - if Braden Score 18 or less Description: If consults are not indicated, leave blank or document N/A Outcome: Progressing   Problem: SCI BOWEL ELIMINATION Goal: RH STG MANAGE BOWEL WITH ASSISTANCE Description: STG Manage Bowel with mod I Assistance. Outcome: Progressing Goal: RH STG SCI MANAGE BOWEL WITH MEDICATION WITH ASSISTANCE Description: STG SCI Manage bowel with medication with mod I assistance. Outcome: Progressing Goal: RH STG SCI MANAGE BOWEL PROGRAM W/ASSIST OR AS APPROPRIATE Description: STG SCI Manage bowel program mod I assist or as appropriate. Outcome: Progressing   Problem: RH SKIN INTEGRITY Goal: RH STG MAINTAIN SKIN INTEGRITY WITH ASSISTANCE Description: STG Maintain Skin Integrity With mod I Assistance. Outcome: Progressing   Problem: RH PAIN MANAGEMENT Goal: RH STG PAIN MANAGED AT OR BELOW PT'S PAIN GOAL Description: < 4 Outcome: Not Progressing

## 2019-06-02 NOTE — Progress Notes (Signed)
Pharmacy Heparin Induced Thrombocytopenia (HIT) Note:  Rebecca Lucas is an 63 y.o. female being evaluated for HIT. Lovenox was started 9/10 for VTE prophylaxis, and baseline platelets were 228.  HIT labs were ordered on 9/19 when platelets dropped to 106 (initial drop 9/16 to 116) .  Auto-populate labs:  Heparin Induced Plt Ab  Date/Time Value Ref Range Status  06/01/2019 02:59 PM 0.051 0.000 - 0.400 OD Final    Comment:    (NOTE) Performed At: [facility] [place], [place] [id number] Rush Farmer MD VL:[id number]       CALCULATE SCORE:  4Ts (see the HIT Algorithm) Score  Thrombocytopenia 1  Timing 2  Thrombosis 0  Other causes of thrombocytopenia 0  Total 3     Recommendations (A or B or C) are based on available lab results:  A. No lab results available (HIT antibody and/or SRA ordered): -Low HIT probability: consider discontinuing HIT labs, continue heparin/LMWH, SRA not recommended; no heparin allergy documentation needed   B. HIT antibody result available: -Ruled Out HIT: No SRA needed, continue heparin product, no allergy documentation needed  C. SRA result available -SRA not available  Name of MD Contacted: N/A  Plan  HIT antibody negative. Continue lovenox. Heparin allergy removed from allergy list.   Damarcus Reggio, Rande Lawman 06/02/2019, 8:37 PM

## 2019-06-02 NOTE — Progress Notes (Signed)
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 Orson Slick.She had acute blood loss anemia and she was subsequently transfused. Patient was admitted for inpatient rehab and was doing well until this weekend.  Patient reports progressive abdominal pain and distention for 3 days associated with decreased appetite and nausea. She had CT abdomen/pelvis on 05/27/2019 which showed interval development of small to moderate volume of ascites &Gallbladder sludge. GI was consulted who recommended ultrasound-guided paracentesis by IR,Protonix 40 mg IV twice daily, Carafate 1 g 3 times daily and EGD-however IRdeferred theparacentesis aslimited ultrasound did not show fluid.   Dr. Dagoberto Ligas (physical medicine and rehab MD)consulted [facility] for further evaluation and management of her abdominal distention and pain.  She underwent EGD (05/29/2019) and per report was noted to have whitish patches throughout the esophagus suspicious for candidiasis, cytology brushing obtained.  Multiple nonbleeding cratered, linear and superficial gastric ulcers some with pigmented material and some with clean bases found in the gastric fundus, body,  greater curvature of the stomach, lesser curvature of the stomach incisura and gastric antrum, biopsies taken. Erythematous gastric antrum, biopsies taken for H. Pylori. GI recommended PPI twice daily for 8 weeks followed by once a day as long as patient needs any NSAIDs and steroid.  Also started her on Diflucan 100 mg p.o. daily for 7 days.  06/01/2019: Patient seen.  Abdominal pain has resolved.  Patient reported 2 episodes of loose stool, but was on MiraLAX.  MiraLAX is currently on hold.  Bilateral lower extremity weakness persists.  No abdominal pain or distention reported.  06/02/2019: Patient seen.  No complaints.  No abdominal pain.  No abdominal distention.   Assessment & Plan:   Principal Problem:   Abdominal pain Active Problems:   Cauda equina syndrome (HCC)   Foot drop, left   Neurogenic bowel   Bandemia   Steroid-induced hyperglycemia   Leucocytosis   Acute blood loss anemia   Postoperative pain   Abdominal distension   Ascites   Therapeutic opioid induced constipation   Oral candidiasis   Transaminitis   Hyponatremia   Acute lower UTI   Cancer related pain   Hypokalemia   Sinus tachycardia   Thrombocytopenia (HCC)   Hypophosphatemia  GeneralizedAbdominal Pain & Distention: -Unknown Etiology- IR tried to perform paracentesis howeverparacentesis was deferred asLimited US revealed No fluid. -CT abdomen/pelvis from 05/27/2019 revealed mild to moderate ascites and sludge in gallbladder.X-ray KUB on 9/14 showed no acute findings -Patient continued to complain of abdominal pain, distention associated withbloating,nausea and decreased appetite -GI consulted, patient underwent EGD with findings as mentioned above showing gastric ulceration with white patches throughout the esophagus. -Continue Protonix 40 mg twice daily, Carafate 1 g 3 times, Carafate daily as per GI recommendation -Continue pain meds per primary.  Avoid NSAIDs if possible. -Lipase level normal.   Acute hepatitis  panel negative.   05/31/2019: KUB yesterday showed probable dilated large or small bowel loop in the right lower quadrant consistent with focal ileus. -Started on Movantik per primary team.  She says she had 2 bowel movements and says that her abdominal pain is much better.  KUB today showing some improvement. 06/01/2019: Resolved.  EGD findings noted.  Patient is currently on PPI and Diflucan.  Patient reported 2 loose stools earlier today.  Will discontinue Movantik and Senokot. 06/02/2019: Symptoms have resolved.  No new complaints.  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 -Supposed to restart chemo on 05/30/2019 outpatient. -PT is on board/ -She has weakness with debility left greater than right. 06/01/2019: Bilateral lower extremity weakness persists.  Leukocytosis: -Likely secondary to steroid use -WBC count is trending down patient is afebrile. -WBC count: 24.6 on 9/7, 11.6 on 9/16; 10.8 on 9/17; 8.7on 9/18. 06/02/2019: WBC today is 12.6.  I doubt needing continue to monitor WBC level.  ChronicConstipation: -Secondary to metastatic spinal tumor versus pain medication - Previously on Dulcolax suppository 10 mg daily, MiraLAX and Linzess -Movantik added per primary.  She says she had 2 BMs and feels pressure. 06/01/2019: Reported 2 loose stools earlier today.  Discontinue Senokot and Movantik. 06/02/2019: Resolved.  UTI: -Urine culture came back positive for E. Coli- Keflex(4 days) and then switched to Macrobid per the primary team due to concern for onset of abdominal pain around the time Keflex was initiated. -Do not think the abdominal pain was related  to the antibiotic. -She denies having any urinary symptoms at this time.  Would recommend to discontinue the antibiotic after today's dose. 06/01/2019: Will defer duration of antibiotics to the primary team.  Acute blood loss anemia: -Patient received3 units PRBC08/26/2020 -H&H is stable-no signs of bleeding.  Thrombocytopenia: -Patient noted to have low platelet count.  Exact etiology unclear at this time. -She is on subcutaneous Lovenox for DVT prophylaxis. If platelet count continuing to be low then we may need to order HIT panel and further work-up to evaluate. 06/01/2019: Consider checking HIT, but will defer to the primary team.  CBC in the morning. 06/02/2019: Platelet count is 126 today (up from 106)  Hypokalemia: -Potassium replacement ordered.  Continue to monitor and replace as needed.  We will also order magnesium level. 06/01/2019: Last renal panel was on 05/31/2019.  We will repeat renal panel.   Subjective: No complaints. No abdominal pain. No loose stools.  Objective: Vitals:   06/01/19 1344 06/01/19 2008 06/02/19 0424 06/02/19 1316  BP: 113/60 130/82 128/72   Pulse: (!) 101 (!) 116 (!) 108   Resp: 16 20 18    Temp: 98.1 F (36.7 C) 99.1 F (37.3 C) 99.1 F (37.3 C)   TempSrc:  Oral Oral   SpO2: 99% 98% 97%   Weight:   61.9 kg 61.5 kg  Height:        Intake/Output Summary (Last 24 hours) at 06/02/2019 1327 Last data filed at 06/02/2019 1317 Gross per 24 hour  Intake 880 ml  Output 2895 ml  Net -2015 ml   Filed Weights   06/01/19 0507 06/02/19 0424 06/02/19 1316  Weight: 59.5 kg 61.9 kg 61.5 kg    Examination:  General exam: Awake and oriented, not in any acute distress at this  time Respiratory system: Clear to auscultation. Cardiovascular system: S1 & S2. No murmur. Gastrointestinal system: No abdominal distention.  Soft and nontender.   Central nervous system: Alert and oriented.  Power both lower extremities is 0/5  Extremities: No leg edema.   Data Reviewed: I have personally reviewed following labs and imaging studies  CBC: Recent Labs  Lab 05/27/19 0420 05/29/19 0428 05/30/19 0433 05/31/19 0455 06/02/19 0407  WBC 17.8* 11.6* 10.8* 8.7 12.6*  NEUTROABS 16.5* 10.9* 10.1*  --  11.0*  HGB 10.7* 10.3* 10.0* 9.1* 8.2*  HCT 33.2* 29.6* 30.2* 27.9* 25.1*  MCV 98.5 95.8 96.2 97.6 98.8  PLT 203 116* 107* 106* 761*   Basic Metabolic Panel: Recent Labs  Lab 05/27/19 0420 05/29/19 0428 05/30/19 0433 05/31/19 0455 06/01/19 0305 06/01/19 1643 06/02/19 1208  NA 135 129* 130* 132*  --  133*  --   K 4.2 3.5 3.7 3.4*  --  4.5  --   CL 101 99 101 105  --  103  --   CO2 23 22 22  20*  --  21*  --   GLUCOSE 93 119* 99 101*  --  133*  --   BUN 18 12 12 16   --  10  --   CREATININE 0.57 0.43* 0.37* 0.48  --  0.47  --   CALCIUM 8.5* 7.5* 7.2* 7.4*  --  7.7*  --   MG  --   --   --   --  1.9 1.7  --   PHOS  --   --   --   --   --  <1.0* 2.2*   GFR: Estimated Creatinine Clearance: 71.7 mL/min (by C-G formula based on SCr of 0.47 mg/dL). Liver Function Tests: Recent Labs  Lab 05/27/19 0420 05/29/19 0428 05/30/19 0433 05/31/19 0455 06/01/19 1643  AST 39 35 55* 53*  --   ALT 89* 61* 74* 78*  --   ALKPHOS 118 104 151* 172*  --   BILITOT 0.5 0.5 0.4 0.5  --   PROT 5.7* 4.9* 4.7* 4.6*  --   ALBUMIN 2.5* 2.1* 2.0* 2.0* 1.9*   Recent Labs  Lab 05/29/19 1559  LIPASE 36   No results for input(s): AMMONIA in the last 168 hours. Coagulation Profile: No results for input(s): INR, PROTIME in the last 168 hours. Cardiac Enzymes: No results for input(s): CKTOTAL, CKMB, CKMBINDEX, TROPONINI in the last 168 hours. BNP (last 3 results) No results for input(s): PROBNP in the last 8760 hours. HbA1C: No results for input(s): HGBA1C in the last 72 hours. CBG: No results for input(s): GLUCAP in the last 168 hours. Lipid Profile: No results for input(s): CHOL, HDL, LDLCALC, TRIG, CHOLHDL, LDLDIRECT in the last 72 hours. Thyroid  Function Tests: No results for input(s): TSH, T4TOTAL, FREET4, T3FREE, THYROIDAB in the last 72 hours. Anemia Panel: No results for input(s): VITAMINB12, FOLATE, FERRITIN, TIBC, IRON, RETICCTPCT in the last 72 hours. Sepsis Labs: No results for input(s): PROCALCITON, LATICACIDVEN in the last 168 hours.  Recent Results (from the past 240 hour(s))  Urine Culture     Status: Abnormal   Collection Time: 05/25/19  1:28 PM   Specimen: Urine, Catheterized  Result Value Ref Range Status   Specimen Description URINE, CATHETERIZED  Final   Special Requests   Final    NONE Performed at [facility], [address] [place], [place] [address]    Culture >=100,000 COLONIES/mL ESCHERICHIA COLI (A)  Final   Report  Status 05/27/2019 FINAL  Final   Organism ID, Bacteria ESCHERICHIA COLI (A)  Final      Susceptibility   Escherichia coli - MIC*    AMPICILLIN <=2 SENSITIVE Sensitive     CEFAZOLIN <=4 SENSITIVE Sensitive     CEFTRIAXONE <=1 SENSITIVE Sensitive     CIPROFLOXACIN <=0.25 SENSITIVE Sensitive     GENTAMICIN <=1 SENSITIVE Sensitive     IMIPENEM <=0.25 SENSITIVE Sensitive     NITROFURANTOIN <=16 SENSITIVE Sensitive     TRIMETH/SULFA <=20 SENSITIVE Sensitive     AMPICILLIN/SULBACTAM <=2 SENSITIVE Sensitive     PIP/TAZO <=4 SENSITIVE Sensitive     Extended ESBL NEGATIVE Sensitive     * >=100,000 COLONIES/mL ESCHERICHIA COLI  SARS Coronavirus 2 [facility] order, Performed in [facility] hospital lab) Nasopharyngeal Nasopharyngeal Swab     Status: None   Collection Time: 05/28/19  4:05 PM   Specimen: Nasopharyngeal Swab  Result Value Ref Range Status   SARS Coronavirus 2 NEGATIVE NEGATIVE Final    Comment: (NOTE) If result is NEGATIVE SARS-CoV-2 target nucleic acids are NOT DETECTED. The SARS-CoV-2 RNA is generally detectable in upper and lower  respiratory specimens during the acute phase of infection. The lowest  concentration of SARS-CoV-2 viral copies this assay can detect is  250  copies / mL. A negative result does not preclude SARS-CoV-2 infection  and should not be used as the sole basis for treatment or other  patient management decisions.  A negative result may occur with  improper specimen collection / handling, submission of specimen other  than nasopharyngeal swab, presence of viral mutation(s) within the  areas targeted by this assay, and inadequate number of viral copies  (<250 copies / mL). A negative result must be combined with clinical  observations, patient history, and epidemiological information. If result is POSITIVE SARS-CoV-2 target nucleic acids are DETECTED. The SARS-CoV-2 RNA is generally detectable in upper and lower  respiratory specimens dur ing the acute phase of infection.  Positive  results are indicative of active infection with SARS-CoV-2.  Clinical  correlation with patient history and other diagnostic information is  necessary to determine patient infection status.  Positive results do  not rule out bacterial infection or co-infection with other viruses. If result is PRESUMPTIVE POSTIVE SARS-CoV-2 nucleic acids MAY BE PRESENT.   A presumptive positive result was obtained on the submitted specimen  and confirmed on repeat testing.  While 2019 novel coronavirus  (SARS-CoV-2) nucleic acids may be present in the submitted sample  additional confirmatory testing may be necessary for epidemiological  and / or clinical management purposes  to differentiate between  SARS-CoV-2 and other Sarbecovirus currently known to infect humans.  If clinically indicated additional testing with an alternate test  methodology [phone]) is advised. The SARS-CoV-2 RNA is generally  detectable in upper and lower respiratory sp ecimens during the acute  phase of infection. The expected result is Negative. Fact Sheet for Patients:  StrictlyIdeas.no Fact Sheet for Healthcare Providers:  BankingDealers.co.za This test is not yet approved or cleared by the [place] FDA and has been authorized for detection and/or diagnosis of SARS-CoV-2 by FDA under an Emergency Use Authorization (EUA).  This EUA will remain in effect (meaning this test can be used) for the duration of the COVID-19 declaration under Section 564(b)(1) of the Act, 21 U.S.C. section 360bbb-3(b)(1), unless the authorization is terminated or revoked sooner. Performed at [facility], [place] [address] [place], [place] [address]  Radiology Studies: No results found.      Scheduled Meds: . acetaminophen  650 mg Oral Q6H  . calcium-vitamin D   Oral BID  . Chlorhexidine Gluconate Cloth  6 each Topical Q0600  . dexamethasone  3 mg Oral Daily  . enoxaparin (LOVENOX) injection  40 mg Subcutaneous Q24H  . fentaNYL  1 patch Transdermal Q72H  . fluconazole  100 mg Oral Daily  . gabapentin  300 mg Oral TID  . lidocaine  1 patch Transdermal Q24H  . metoprolol tartrate  12.5 mg Oral BID  . pantoprazole  40 mg Oral BID  . phosphorus  500 mg Oral Daily  . sucralfate  1 g Oral TID WC & HS   Continuous Infusions: . potassium PHOSPHATE IVPB (mEq)       LOS: 19 days    Bonnell Public, MD [facility] Pager on amion  If 7PM-7AM, please contact night-coverage [email or web address] Password [email or web address] 06/02/2019, 1:27 PM

## 2019-06-03 ENCOUNTER — Inpatient Hospital Stay (HOSPITAL_COMMUNITY): Payer: 59

## 2019-06-03 ENCOUNTER — Inpatient Hospital Stay (HOSPITAL_COMMUNITY): Payer: 59 | Admitting: Occupational Therapy

## 2019-06-03 ENCOUNTER — Inpatient Hospital Stay (HOSPITAL_COMMUNITY): Payer: 59 | Admitting: Physical Therapy

## 2019-06-03 LAB — COMPREHENSIVE METABOLIC PANEL
ALT: 48 U/L — ABNORMAL HIGH (ref 0–44)
AST: 30 U/L (ref 15–41)
Albumin: 2.1 g/dL — ABNORMAL LOW (ref 3.5–5.0)
Alkaline Phosphatase: 127 U/L — ABNORMAL HIGH (ref 38–126)
Anion gap: 9 (ref 5–15)
BUN: 12 mg/dL (ref 8–23)
CO2: 25 mmol/L (ref 22–32)
Calcium: 8.1 mg/dL — ABNORMAL LOW (ref 8.9–10.3)
Chloride: 98 mmol/L (ref 98–111)
Creatinine, Ser: 0.72 mg/dL (ref 0.44–1.00)
GFR calc Af Amer: >60 mL/min (ref >60)
GFR calc non Af Amer: >60 mL/min (ref >60)
Glucose, Bld: 106 mg/dL — ABNORMAL HIGH (ref 70–99)
Potassium: 4.3 mmol/L (ref 3.5–5.1)
Sodium: 132 mmol/L — ABNORMAL LOW (ref 135–145)
Total Bilirubin: 0.2 mg/dL — ABNORMAL LOW (ref 0.3–1.2)
Total Protein: 5 g/dL — ABNORMAL LOW (ref 6.5–8.1)

## 2019-06-03 LAB — CBC WITH DIFFERENTIAL/PLATELET
Basophils Absolute: 0 10*3/uL (ref 0.0–0.1)
Basophils Relative: 0 %
Eosinophils Absolute: 0 10*3/uL (ref 0.0–0.5)
Eosinophils Relative: 0 %
HCT: 25.6 % — ABNORMAL LOW (ref 36.0–46.0)
Hemoglobin: 8.2 g/dL — ABNORMAL LOW (ref 12.0–15.0)
Lymphocytes Relative: 2 %
Lymphs Abs: 0.3 10*3/uL — ABNORMAL LOW (ref 0.7–4.0)
MCH: 31.7 pg (ref 26.0–34.0)
MCHC: 32 g/dL (ref 30.0–36.0)
MCV: 98.8 fL (ref 80.0–100.0)
Monocytes Absolute: 0.6 10*3/uL (ref 0.1–1.0)
Monocytes Relative: 4 %
Neutro Abs: 11.9 10*3/uL — ABNORMAL HIGH (ref 1.7–7.7)
Neutrophils Relative %: 87 %
Platelets: 141 10*3/uL — ABNORMAL LOW (ref 150–400)
RBC: 2.59 MIL/uL — ABNORMAL LOW (ref 3.87–5.11)
RDW: 17 % — ABNORMAL HIGH (ref 11.5–15.5)
WBC: 13.7 10*3/uL — ABNORMAL HIGH (ref 4.0–10.5)
nRBC: 0 % (ref 0.0–0.2)

## 2019-06-03 LAB — SURGICAL PATHOLOGY

## 2019-06-03 LAB — OCCULT BLOOD X 1 CARD TO LAB, STOOL: Fecal Occult Bld: NEGATIVE

## 2019-06-03 LAB — PHOSPHORUS: Phosphorus: 2.8 mg/dL (ref 2.5–4.6)

## 2019-06-03 MED ORDER — IOHEXOL 300 MG/ML  SOLN
100.0000 mL | Freq: Once | INTRAMUSCULAR | Status: AC | PRN
  Administered 2019-06-03: 15:00:00 100 mL via INTRAVENOUS

## 2019-06-03 MED ORDER — TAMSULOSIN HCL 0.4 MG PO CAPS
0.4000 mg | ORAL_CAPSULE | Freq: Every day | ORAL | Status: DC
  Administered 2019-06-03 – 2019-06-06 (×4): 0.4 mg via ORAL
  Filled 2019-06-03 (×4): qty 1

## 2019-06-03 NOTE — Progress Notes (Signed)
CT abd/pelvis with contrast done today.1.6 cm hypodense mass within the caudate lobe of the liver,highly suspicious for a new liver metastasis. Also showed soft tissue mass at the crus of the RIGHT hemidiaphragm, measuring 2.8 cm, compatible with metastatic implant and/or soft tissue extension of the L2 vertebral body metastasis.  As per the notes,patient is already following with oncology as an outpatient to restart her chemotherapy.  There is  nothing acute which needs urgent attention/ intervention in regards to her  abdominal distention.  She denies any nausea, vomiting or pain.She should follow up with her oncologist as soon as possible after she is discharged from [place]. TRH will sign off. Please call us again if needed.

## 2019-06-03 NOTE — Progress Notes (Signed)
[place] PHYSICAL MEDICINE & REHABILITATION PROGRESS NOTE   Subjective/Complaints:   Pt reports she's voiding but not emptying- explained we can try Flomax to see if can get her emptying completely- if not, the 2 options are in/out caths or foley-she's also asking when she can go home so can get Chemo started. Asked her to speak with therapy and her husband to see what level of function she needs to be at to go home.  Also c/o R lateral hip pain. Lidocaine patch has been somewhat helpful for that. Thinks Robaxin has been helpful for aches and pains and cramps.  slept well with no trazodone last night with Robaxin.   ROS: Abd pain (+)- less back pain  Denies CP, shortness of breath.  Objective:   No results found. Recent Labs    06/02/19 0407 06/03/19 0520  WBC 12.6* 13.7*  HGB 8.2* 8.2*  HCT 25.1* 25.6*  PLT 126* 141*   Recent Labs    06/01/19 1643 06/03/19 0520  NA 133* 132*  K 4.5 4.3  CL 103 98  CO2 21* 25  GLUCOSE 133* 106*  BUN 10 12  CREATININE 0.47 0.72  CALCIUM 7.7* 8.1*    Intake/Output Summary (Last 24 hours) at 06/03/2019 1048 Last data filed at 06/03/2019 0930 Gross per 24 hour  Intake 998 ml  Output 2575 ml  Net -1577 ml     Physical Exam: Vital Signs Blood pressure 132/75, pulse (!) 112, temperature 99.4 F (37.4 C), temperature source Oral, resp. rate 17, height 5\' 7"  (1.702 m), weight 64.6 kg, SpO2 96 %. Constitutional: No distress . Vital signs and labs reviewed. Sitting up on BSC then on L side in bed, nurse and OT in room, NAD HENT: Normocephalic.  Atraumatic. Eyes: EOMI. No discharge. Cardiovascular: tachycardic, regular rhythm Respiratory: CTA B/L. GI: some distension; softer; mild TTP diffusely Skin: Warm and dry.  Intact. Psych: Flat. Musc: No edema in extremities. TTP over R trochanteric bursa c/w bursitis Neuro: Alert Motor: Bilateral upper extremities: 5/5 proximal distal Right lower extremity: 3-/5 proximal distal,  unchanged, unchanged Left lower extremity: 0/5 proximal to distal, unchanged  Assessment/Plan: 1. 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  Physiatrist is providing close team supervision and 24 hour management of active medical problems listed below.  Physiatrist and rehab team continue to assess barriers to discharge/monitor patient progress toward functional and medical goals  Care Tool:  Bathing  Bathing activity did not occur: Refused Body parts bathed by patient: Right arm, Left arm, Chest, Abdomen, Front perineal area, Right upper leg, Left upper leg, Face, Buttocks, Right lower leg, Left lower leg   Body parts bathed by helper: Buttocks, Right lower leg, Left lower leg     Bathing assist Assist Level: Set up assist     Upper Body Dressing/Undressing Upper body dressing   What is the patient wearing?: Pull over shirt, Orthosis Orthosis activity level: Performed by patient  Upper body assist Assist Level: Set up assist    Lower Body Dressing/Undressing Lower body dressing      What is the patient wearing?: Underwear/pull up, Pants     Lower body assist Assist for lower body dressing: Minimal Assistance - Patient > 75%     Toileting Toileting Toileting Activity did not occur (Clothing management and hygiene only): Refused  Toileting assist Assist for toileting: Total Assistance - Patient < 25%     Transfers Chair/bed transfer  Transfers assist     Chair/bed transfer assist level: Moderate Assistance - Patient 50 - 74%(Sliding board)     Locomotion Ambulation   Ambulation assist      Assist level: Contact Guard/Touching assist Assistive device: Walker-rolling Max distance: 45'   Walk 10 feet activity   Assist     Assist level:  Contact Guard/Touching assist Assistive device: Walker-rolling   Walk 50 feet activity   Assist Walk 50 feet with 2 turns activity did not occur: Safety/medical concerns         Walk 150 feet activity   Assist Walk 150 feet activity did not occur: Safety/medical concerns         Walk 10 feet on uneven surface  activity   Assist Walk 10 feet on uneven surfaces activity did not occur: Safety/medical concerns         Wheelchair     Assist Will patient use wheelchair at discharge?: Yes Type of Wheelchair: Manual Wheelchair activity did not occur: Safety/medical concerns  Wheelchair assist level: Supervision/Verbal cueing Max wheelchair distance: 150    Wheelchair 50 feet with 2 turns activity    Assist    Wheelchair 50 feet with 2 turns activity did not occur: Safety/medical concerns   Assist Level: Supervision/Verbal cueing   Wheelchair 150 feet activity     Assist    Wheelchair 150 feet activity did not occur: Safety/medical concerns   Assist Level: Supervision/Verbal cueing   Medical Problem List and Plan: 1.Decreased functional mobilitysecondary to 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   Continue CIR -Back brace when out of bed. -Plan is to follow-up outpatient Dr. Earlie Server in regards to chemotherapy  Chemotherapy was to begin on 9/17, however now on hold  2. Antithrombotics: -DVT/anticoagulation:SCDs. Lovenox  LE venous dopplers negative for DVT -antiplatelet therapy: N/A 3. Pain Management:  Neurontin 300 mg 3 times daily  Continue patch 75 mcg, Advil 400 mg  Ibuprofen changed to PRN on 9/5,  DC'd on 9/17 due to EGD findings and discussion with hospitalist  9/16- oxy is q4 hours prn   Lidoderm patch added on 9/17 to back with now mild improvement  Robaxin 500 3 times daily as needed started on 9/20  9/21- R trochanteric bursitis- somewhat improved with robaxin and lidocaine patch 4. Mood:motivational support -antipsychotic agents: N/A 5. Neuropsych: This patientiscapable of making decisions on herown behalf. 6. Skin/Wound Care:Routine skin checks 7. Fluids/Electrolytes/Nutrition:Routine in and outs  P.o. intake improving, IVF DC'd on 9/20 8. Acute blood loss anemia.   Hemoglobin 8.2 on 9/20, labs ordered for tomorrow  Hemoccult ordered  Continue to await further GI recs  Continue to monitor 9. Leukocytosis secondary to steroids. Continue to monitor  WBCs 12.6 on 9/20  9/21- WBC up to 13.7- IM ordering CT of abd/pelvis  Afebrile  Continue to monitor 10. Urinary retention/acute lower UTI:  Keflex, started on 9/13, changed to [place] on 9/17, [place] on 9/20 11. Constipation in setting of Neurogenic bowel and opioid-induced constipation. Adjust bowel program as needed  Bowel program with dig stim at night/evening.   Linzess changed to [place] on 9/17, [place] by hospitalist-may need to resume  Miralax DC'd  Appears to be improving  KUB personally reviewed, showing improvement since suggestion of ileus on 9/17  Improving on 9/20. 12.  Steroid-induced hyperglycemia  Continue to monitor  Elevated on 9/20 13. Severe abd pain  CT showed ascites and gallbladder sludge- GI consulted- ordered U/S guided paracentesis- GI ordered associated labs    EGD performed by GI, appreciate recs  PPI twice daily x8 weeks then daily as long as patient requires steroids  Await cytology results  Hospitalist following, appreciate recs, notes reviewed  Diet changed back to regular diet after being on liquids on 9/17  Improved 14.  Sleep disturbance  Trazodone 50 nightly as  needed  15.  Hyponatremia  Sodium 133 on 9/19, labs ordered for tomorrow  Continue to monitor 16.  Transaminitis  LFTs elevated on 9/18, labs ordered for tomorrow  9/21- Slightly elevated, but improving- ALT 48; AST 30  Continue to monitor 17.  Oral candidiasis  Noted on EGD  Diflucan 100 mg daily x7 days per GI, started on 9/16 18.  Tachycardia  Likely secondary to pain  ECG reviewed, limited  Low-dose metoprolol started on 9/18  First troponin borderline negative, repeat negative 19.  Hypokalemia  Potassium 4.5 on 9/20, labs ordered for tomorrow  Supplement initiated, [place] on 9/20  Magnesium 1.7 on 9/20 20.  Thrombocytopenia  Platelets 126 on 9/20  HIT pending 21.  Hypophosphatemia  Noted to have critical value, supplement initiated  Repeat labs pending, also will ordered for tomorrow  9/21- 2.8- which is in normal levels  22. Dispo  9/21- will discuss with pt/husband/team  LOS: 20 days A FACE TO FACE EVALUATION WAS PERFORMED  Tsugio Elison 06/03/2019, 10:48 AM

## 2019-06-03 NOTE — Progress Notes (Signed)
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 Orson Slick.She had acute blood loss anemia and she was subsequently transfused. Patient was admitted for inpatient rehab and was doing well until this weekend.Patient reports progressive abdominal pain and distention for 3 days associated with decreased appetite and nausea. She had CT abdomen/pelvis on 05/27/2019 which showed interval development of small to moderate volume of ascites &Gallbladder sludge. GI was consulted who recommended ultrasound-guided paracentesis by IR,Protonix 40 mg IV twice daily, Carafate 1 g 3 times daily and EGD-however IRdeferred theparacentesis aslimited ultrasound did not show fluid. She underwent EGD (05/29/2019) and per report was noted to have whitish patches throughout the esophagus suspicious for candidiasis, cytology brushing obtained. Multiple nonbleeding cratered, linear and superficial gastric ulcers some with pigmented material and some with clean bases found in the gastric fundus, body, greater curvature of the stomach, lesser curvature of the stomach incisura and gastric antrum, biopsies taken. Erythematous gastric antrum, biopsies taken for H.  Pylori. GI recommended PPI twice daily for 8 weeks followed by once a day as long as patient needs any NSAIDs and steroid. Also started her on Diflucan 100 mg p.o. daily for 7 days.  9/21: Patient's abdominal pain has resolved.  No complaints of nausea or vomiting.  Abdomen is still distended but patient states it has improved than last time.  I will order CT abdomen/pelvis with contrast to rule out metastatic pathology.   Assessment & Plan:   Principal Problem:   Abdominal pain Active Problems:   Cauda equina syndrome (HCC)   Foot drop, left   Neurogenic bowel   Bandemia   Steroid-induced hyperglycemia   Leucocytosis   Acute blood loss anemia   Postoperative pain   Abdominal distension   Ascites   Therapeutic opioid induced constipation   Oral candidiasis   Transaminitis   Hyponatremia   Acute lower UTI   Cancer related pain   Hypokalemia   Sinus tachycardia   Thrombocytopenia (HCC)   Hypophosphatemia   GeneralizedAbdominal Pain & Distention: -Unknown Etiology- IR tried to perform paracentesis howeverparacentesis was deferred asLimited US revealed No fluid. -CT abdomen/pelvis from 05/27/2019 revealed mild to moderate ascites and sludge in gallbladder.X-ray KUB on 9/14 showed no acute findings -Patient continued tocomplain of abdominal pain,distention associated withbloating,nausea and decreased appetite -GIconsulted, patient underwent EGD with findings as mentioned above showing gastric ulceration with white patches throughout the esophagus. -Continue Protonix 40 mg twice daily,Carafate 1 g 3 times, Carafatedaily as per GI recommendation -Continue pain meds per primary.Avoid NSAIDs if possible. -Lipase level normal. Acute hepatitis panel negative.  05/31/2019: KUB yesterday showed probable dilated large or small bowel loop in the right lower quadrant consistent with focal ileus. -  Patient is currently on PPI and Diflucan.  Discontinued Movantik and Senokot  due o losse stools -Ordering CT abdomen/pelvis with contrast to rule out  metastatic disease.  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 -Supposed to restart chemo on 05/30/2019 outpatient. -PT is on board -She has weakness with debility left greater than right. 06/01/2019: Bilateral lower extremity weakness persists.  Leukocytosis: -Likely secondary to steroid use,mild   ChronicConstipation: -Secondary to metastatic spinal tumor versus pain medication - Previously on Dulcolax suppository 10 mg daily, MiraLAX and Linzess -Movantik added per primary.  On 06/01/2019,she eported 2 loose stools earlier today.  Discontinued Senokot and Movantik.  UTI: -Urine culture came back positive for E. Coli- Keflex(4 days) and then switched to Macrobid per the primary team due to concern for onset of abdominal pain around the time Keflex was initiated. -Do not think the abdominal pain was related to the antibiotic. -She denies having any urinary symptoms at this time.  Completed antibiotics course.  Acute blood loss anemia: -Patient received3 units PRBC08/26/2020 -H&H is stable-no signs of bleeding.  Thrombocytopenia: -Patient noted to have low platelet count. Exact etiology unclear at this time. -improving      DVT prophylaxis: SCD Code Status: Full Family Communication: None present at bed side Disposition Plan: As per CIR   SAntimicrobials:  Anti-infectives (From admission, onward)   Start     Dose/Rate Route Frequency Ordered Stop   05/29/19 1500  fluconazole (DIFLUCAN) tablet 100 mg  Status:  Discontinued     100 mg Oral Daily 05/29/19 1453 05/29/19 1543    05/29/19 1430  fluconazole (DIFLUCAN) tablet 100 mg     100 mg Oral Daily 05/29/19 1420 06/05/19 0959   05/26/19 1400  cephALEXin (KEFLEX) capsule 250 mg  Status:  Discontinued     250 mg Oral Every 8 hours 05/26/19 1246 05/30/19 1253      Subjective:  Patient seen and examined at bedside this morning.  Hemodynamically stable.  Feels better.  Slept well.  Denies any abdominal pain, nausea or vomiting.  Abdomen still distended but she thinks that it has improved since last time.  Objective: Vitals:   06/02/19 1316 06/02/19 1339 06/02/19 2006 06/03/19 0459  BP:  126/69 117/62 132/75  Pulse:  (!) 114 (!) 118 (!) 112  Resp:  16 17 17   Temp:  99.6 F (37.6 C) 98.6 F (37 C) 99.4 F (37.4 C)  TempSrc:  Oral Oral Oral  SpO2:  97% 100% 96%  Weight: 61.5 kg   64.6 kg  Height:        Intake/Output Summary (Last 24 hours) at 06/03/2019 0844 Last data filed at 06/03/2019 2330 Gross per 24 hour  Intake 998 ml  Output 2025 ml  Net -1027 ml   Filed Weights   06/02/19 0424 06/02/19 1316 06/03/19 0459  Weight: 61.9 kg 61.5 kg 64.6 kg    Examination:  General exam: Appears calm and comfortable ,Not in distress,average built HEENT:PERRL,Oral mucosa moist, Ear/Nose normal on gross exam Respiratory system: Bilateral equal air entry, normal vesicular breath sounds, no wheezes or crackles  Cardiovascular system: S1 & S2 heard, RRR. No JVD, murmurs, rubs, gallops or clicks. No pedal edema.  Chemo-Port on the right chest Gastrointestinal system: Abdomen is distended, soft and nontender. No organomegaly or masses felt. Hyperactive bowel  sounds heard. Central nervous system: Alert and oriented. No focal neurological deficits. Extremities: No edema, no clubbing ,no cyanosis,  distal peripheral pulses palpable. Skin: No rashes, lesions or ulcers,no icterus ,no pallor MSK: Normal muscle bulk,tone ,power Psychiatry: Judgement and insight appear normal. Mood & affect appropriate.     Data  Reviewed: I have personally reviewed following labs and imaging studies  CBC: Recent Labs  Lab 05/29/19 0428 05/30/19 0433 05/31/19 0455 06/02/19 0407 06/03/19 0520  WBC 11.6* 10.8* 8.7 12.6* 13.7*  NEUTROABS 10.9* 10.1*  --  11.0* PENDING  HGB 10.3* 10.0* 9.1* 8.2* 8.2*  HCT 29.6* 30.2* 27.9* 25.1* 25.6*  MCV 95.8 96.2 97.6 98.8 98.8  PLT 116* 107* 106* 126* 778*   Basic Metabolic Panel: Recent Labs  Lab 05/29/19 0428 05/30/19 0433 05/31/19 0455 06/01/19 0305 06/01/19 1643 06/02/19 1208 06/03/19 0520  NA 129* 130* 132*  --  133*  --  132*  K 3.5 3.7 3.4*  --  4.5  --  4.3  CL 99 101 105  --  103  --  98  CO2 22 22 20*  --  21*  --  25  GLUCOSE 119* 99 101*  --  133*  --  106*  BUN 12 12 16   --  10  --  12  CREATININE 0.43* 0.37* 0.48  --  0.47  --  0.72  CALCIUM 7.5* 7.2* 7.4*  --  7.7*  --  8.1*  MG  --   --   --  1.9 1.7  --   --   PHOS  --   --   --   --  <1.0* 2.2* 2.8   GFR: Estimated Creatinine Clearance: 71.8 mL/min (by C-G formula based on SCr of 0.72 mg/dL). Liver Function Tests: Recent Labs  Lab 05/29/19 0428 05/30/19 0433 05/31/19 0455 06/01/19 1643 06/03/19 0520  AST 35 55* 53*  --  30  ALT 61* 74* 78*  --  48*  ALKPHOS 104 151* 172*  --  127*  BILITOT 0.5 0.4 0.5  --  0.2*  PROT 4.9* 4.7* 4.6*  --  5.0*  ALBUMIN 2.1* 2.0* 2.0* 1.9* 2.1*   Recent Labs  Lab 05/29/19 1559  LIPASE 36   No results for input(s): AMMONIA in the last 168 hours. Coagulation Profile: No results for input(s): INR, PROTIME in the last 168 hours. Cardiac Enzymes: No results for input(s): CKTOTAL, CKMB, CKMBINDEX, TROPONINI in the last 168 hours. BNP (last 3 results) No results for input(s): PROBNP in the last 8760 hours. HbA1C: No results for input(s): HGBA1C in the last 72 hours. CBG: No results for input(s): GLUCAP in the last 168 hours. Lipid Profile: No results for input(s): CHOL, HDL, LDLCALC, TRIG, CHOLHDL, LDLDIRECT in the last 72 hours. Thyroid  Function Tests: No results for input(s): TSH, T4TOTAL, FREET4, T3FREE, THYROIDAB in the last 72 hours. Anemia Panel: No results for input(s): VITAMINB12, FOLATE, FERRITIN, TIBC, IRON, RETICCTPCT in the last 72 hours. Sepsis Labs: No results for input(s): PROCALCITON, LATICACIDVEN in the last 168 hours.  Recent Results (from the past 240 hour(s))  Urine Culture     Status: Abnormal   Collection Time: 05/25/19  1:28 PM   Specimen: Urine, Catheterized  Result Value Ref Range Status   Specimen Description URINE, CATHETERIZED  Final   Special Requests   Final    NONE Performed at [facility], [address], [place], [place] [address]    Culture >=100,000 COLONIES/mL ESCHERICHIA COLI (A)  Final   Report Status 05/27/2019 FINAL  Final   Organism ID, Bacteria ESCHERICHIA COLI (A)  Final      Susceptibility   Escherichia coli - MIC*    AMPICILLIN <=2 SENSITIVE Sensitive     CEFAZOLIN <=4 SENSITIVE Sensitive     CEFTRIAXONE <=1 SENSITIVE Sensitive     CIPROFLOXACIN <=0.25 SENSITIVE Sensitive     GENTAMICIN <=1 SENSITIVE Sensitive     IMIPENEM <=0.25 SENSITIVE Sensitive     NITROFURANTOIN <=16 SENSITIVE Sensitive     TRIMETH/SULFA <=20 SENSITIVE Sensitive     AMPICILLIN/SULBACTAM <=2 SENSITIVE Sensitive     PIP/TAZO <=4 SENSITIVE Sensitive     Extended ESBL NEGATIVE Sensitive     * >=100,000 COLONIES/mL ESCHERICHIA COLI  SARS Coronavirus 2 [facility] order, Performed in [facility] hospital lab) Nasopharyngeal Nasopharyngeal Swab     Status: None   Collection Time: 05/28/19  4:05 PM   Specimen: Nasopharyngeal Swab  Result Value Ref Range Status   SARS Coronavirus 2 NEGATIVE NEGATIVE Final    Comment: (NOTE) If result is NEGATIVE SARS-CoV-2 target nucleic acids are NOT DETECTED. The SARS-CoV-2 RNA is generally detectable in upper and lower  respiratory specimens during the acute phase of infection. The lowest  concentration of SARS-CoV-2 viral copies this assay can detect is  250  copies / mL. A negative result does not preclude SARS-CoV-2 infection  and should not be used as the sole basis for treatment or other  patient management decisions.  A negative result may occur with  improper specimen collection / handling, submission of specimen other  than nasopharyngeal swab, presence of viral mutation(s) within the  areas targeted by this assay, and inadequate number of viral copies  (<250 copies / mL). A negative result must be combined with clinical  observations, patient history, and epidemiological information. If result is POSITIVE SARS-CoV-2 target nucleic acids are DETECTED. The SARS-CoV-2 RNA is generally detectable in upper and lower  respiratory specimens dur ing the acute phase of infection.  Positive  results are indicative of active infection with SARS-CoV-2.  Clinical  correlation with patient history and other diagnostic information is  necessary to determine patient infection status.  Positive results do  not rule out bacterial infection or co-infection with other viruses. If result is PRESUMPTIVE POSTIVE SARS-CoV-2 nucleic acids MAY BE PRESENT.   A presumptive positive result was obtained on the submitted specimen  and confirmed on repeat testing.  While 2019 novel coronavirus  (SARS-CoV-2) nucleic acids may be present in the submitted sample  additional confirmatory testing may be necessary for epidemiological  and / or clinical management purposes  to differentiate between  SARS-CoV-2 and other Sarbecovirus currently known to infect humans.  If clinically indicated additional testing with an alternate test  methodology [phone]) is advised. The SARS-CoV-2 RNA is generally  detectable in upper and lower respiratory sp ecimens during the acute  phase of infection. The expected result is Negative. Fact Sheet for Patients:  StrictlyIdeas.no Fact Sheet for Healthcare  Providers: BankingDealers.co.za This test is not yet approved or cleared by the [place] FDA and has been authorized for detection and/or diagnosis of SARS-CoV-2 by FDA under an Emergency Use Authorization (EUA).  This EUA will remain in effect (meaning this test can be used) for the duration of the COVID-19 declaration under Section 564(b)(1) of the Act, 21 U.S.C. section 360bbb-3(b)(1), unless the authorization is terminated or revoked sooner. Performed at [facility], [place] [address], [place], [place] [address]          Radiology Studies: No results found.  Scheduled Meds:  acetaminophen  650 mg Oral Q6H   calcium-vitamin D   Oral BID   Chlorhexidine Gluconate Cloth  6 each Topical Q0600   dexamethasone  3 mg Oral Daily   enoxaparin (LOVENOX) injection  40 mg Subcutaneous Q24H   fentaNYL  1 patch Transdermal Q72H   fluconazole  100 mg Oral Daily   gabapentin  300 mg Oral TID   lidocaine  1 patch Transdermal Q24H   metoprolol tartrate  12.5 mg Oral BID   pantoprazole  40 mg Oral BID   phosphorus  500 mg Oral Daily   sucralfate  1 g Oral TID WC & HS   Continuous Infusions:  potassium PHOSPHATE IVPB (mEq)       LOS: 20 days    Time spent: 35 mins.More than 50% of that time was spent in counseling and/or coordination of care.      Shelly Coss, MD [facility] Pager [phone]  If 7PM-7AM, please contact night-coverage [email or web address] Password [facility] 06/03/2019, 8:44 AM

## 2019-06-03 NOTE — Progress Notes (Signed)
Occupational Therapy Session Note  Patient Details  Name: Rebecca Lucas MRN: [id number] Date of Birth: [birth year]  Today's Date: 06/03/2019 OT Individual Time: 8315-1761 OT Individual Time Calculation (min): 45 min    Short Term Goals: Week 3:  OT Short Term Goal 1 (Week 3): STG=LTG Due to LOS  Skilled Therapeutic Interventions/Progress Updates:    Pt seen for OT session focusing on mobility in prep for functional tasks. Pt awake in side-lying upon arrival, agreeable to tx session. Voiced pain as "manageable" and declined need for intervention.  She transferred to sitting EOB with min A and VCs for technique and use of bed rails. TED hoes, shoes and AFO donned total A seated EOB. Attempted sit>stand from highly elevated EOB to RW, although able to clear buttock with max A being provided, pt unable to come into upright/erect standing posture. Therefore, used sliding board to transfer to w/c with CGA and assist to place and stabilize board.  Attempted sit>stand from side of // bars in order to allow for pt to pull up with B UEs into standing, however, with same results as noted below with blocking of R knee provided, however, insufficient. Therefore, utilized STEDY and pt able to power into STEDY with max A. Due to fatigue, pt also required max A to stand from perched position on STEDY. Pt returned to room at end of session, left seated in w/c with all needs in reach.  Following pt's medical set back last week, she is now much more weak in LEs with overall decrease in strength and functional activity tolerance. Therefore, recommend w/c level now at d/c for increased safety and to reduce caregiver burden/increase independence with ADLS/mobility.   Therapy Documentation Precautions:  Precautions Precautions: Fall, Back Precaution Comments: reviewed spinal precautions Required Braces or Orthoses: Spinal Brace Spinal Brace: Lumbar corset, Applied in sitting position Restrictions Weight  Bearing Restrictions: No   Therapy/Group: Individual Therapy  Alece Koppel L 06/03/2019, 7:10 AM

## 2019-06-03 NOTE — Progress Notes (Addendum)
Physical Therapy Session Note  Patient Details  Name: Rebecca Lucas MRN: [id number] Date of Birth: March 11, 1956  Today's Date: 06/03/2019 PT Individual Time: 0160-1093; 2355-7322 PT Individual Time Calculation (min): 10 min , 25 min Pt missed 35 min today due to bladder scan/catheterization.  Short Term Goals:  Week 2:  PT Short Term Goal 1 (Week 2): =LTG due to ELOS PT Short Term Goal 1 - Progress (Week 2): Progressing toward goal Week 3:  PT Short Term Goal 1 (Week 3): Pt will complete transfers with mod A consistently PT Short Term Goal 2 (Week 3): Pt will perform sit to stand with mod A PT Short Term Goal 3 (Week 3): Pt will tolerate sitting up in chair x 1 hour      Skilled Therapeutic Interventions/Progress Updates:   tx 1:  Pt in L side lying in bed.  She stated that bladder scan increased her R back/hip pain to 6/10.  Therapeutic exercise performed with LE to increase strength for functional mobility.- R side lying active assistive each: 10 x 1 R hip abduction with flexed hip and knee; bil hip flexion/ extension with flexed knees.  tx 2:  Make up session of 25 min immediately after scheduled session:  Therapeutic exercise performed with LE to increase strength for functional mobility.; in supine 2 x 10 each  cervical flexion in hooklying position, R/L scapular protraction, pelvic tilts.  PT instructed pt in diaphragmatic breathing with good carry over.   Rolling L >< supine with set-up and use of bed rails.  Scooting laterally in bed with cues for pelvic tilt , to assist helper using bed pad carefully.   At end of session, pt resting in bed with needs at hand and alarm set.     Therapy Documentation Precautions:  Precautions Precautions: Fall, Back Precaution Comments: reviewed spinal precautions Required Braces or Orthoses: Spinal Brace Spinal Brace: Lumbar corset, Applied in sitting position Restrictions Weight Bearing Restrictions: No General: PT Amount of  Missed Time (min): 35 Minutes PT Missed Treatment Reason: Nursing care   Pain: Pain Assessment Pain Scale: 0-10 Pain Score: 6 Pain Type: Acute pain;Surgical pain Pain Location: Back Pain Orientation: Lower;Mid Pain Descriptors / Indicators: Aching;Discomfort Pain Frequency: Constant Pain Onset: On-going Patients Stated Pain Goal: 3 Pain Intervention(s): Medication (See eMAR)(tramadol, tylenol) Multiple Pain Sites: No       Therapy/Group: Individual Therapy  Sameen Leas 06/03/2019, 12:27 PM

## 2019-06-03 NOTE — Progress Notes (Signed)
Occupational Therapy Session Note  Patient Details  Name: Rebecca Lucas MRN: [id number] Date of Birth: 1956/09/10  Today's Date: 06/03/2019 OT Individual Time: 1500-1600 OT Individual Time Calculation (min): 60 min    Short Term Goals: Week 3:  OT Short Term Goal 1 (Week 3): STG=LTG Due to LOS  Skilled Therapeutic Interventions/Progress Updates:    Upon entering the room, pt supine in bed and reports having just returned from a test off the unit. Pt reports feeling "full" and requesting bladder scan. OT notified RN. Supine >sit with mod A to EOB. OT placing slide board and pt transferring with min A overall to drop arm commode chair. Pt pushing up on arm rests for clothing management. Pt able to have BM and able to pass some urine on her own. Pt needing total A for hygiene and returning to bed for bladder scan. RN's performing I & O cath with therapist providing information regarding follow up therapy. OT provided total A to pull pants over B hips secondary to time management. Repositioning in bed for comfort and call bell within reach. Bed alarm activated.  Therapy Documentation Precautions:  Precautions Precautions: Fall, Back Precaution Comments: reviewed spinal precautions Required Braces or Orthoses: Spinal Brace Spinal Brace: Lumbar corset, Applied in sitting position Restrictions Weight Bearing Restrictions: No General:   Vital Signs: Therapy Vitals Temp: 98.3 F (36.8 C) Pulse Rate: (!) 107 Resp: 17 BP: 127/67 Patient Position (if appropriate): Lying Oxygen Therapy SpO2: 98 % O2 Device: Room Air Pain: Pain Assessment Pain Scale: 0-10 Pain Score: 5  Pain Type: Acute pain Pain Location: Hip Pain Orientation: Right Pain Radiating Towards: right hip Pain Descriptors / Indicators: Aching Pain Frequency: Constant Pain Onset: On-going Patients Stated Pain Goal: 3 Pain Intervention(s): Medication (See eMAR)(tylenol, gabapentin)   Therapy/Group: Individual  Therapy  [name] 06/03/2019, 4:18 PM

## 2019-06-03 NOTE — Progress Notes (Signed)
Physical Therapy Session Note  Patient Details  Name: Rebecca Lucas MRN: [id number] Date of Birth: 11/13/1955  Today's Date: 06/03/2019 PT Individual Time: 1300-1400 PT Individual Time Calculation (min): 60 min   Short Term Goals: Week 3:  PT Short Term Goal 1 (Week 3): Pt will complete transfers with mod A consistently PT Short Term Goal 2 (Week 3): Pt will perform sit to stand with mod A PT Short Term Goal 3 (Week 3): Pt will tolerate sitting up in chair x 1 hour  Skilled Therapeutic Interventions/Progress Updates:    Pt received sidelying in bed, agreeable to PT session. No complaints of pain. Sidelying to sitting EOB with mod A. Set pt up with 16x16 w/c for more narrow seat with improved fit and support while seated in w/c. Simulation of patient's bed height at home (27.5") slide board transfer from bed to w/c. Pt is min A to slide down significant slope into w/c. Pt is unable to safely transfer back into bed due to height difference between wheelchair seat and bed height. Pt is more open and agreeable to suggestion of hospital bed for ease of mobility and transfers at home. Slide board transfer back to level height bed with min A. Pt demos improved sitting balance and ability to assist with transfers this date. Sit to sidelying mod A for BLE management. Supine BLE strengthening therex: heel slides, hip abd, SKFO with only trace muscle activation noted in LLE, total A for LLE management. Supine LLE HS, gastroc, and hip IR/ER stretches 3 x 60 sec each. Pt left sidelying in bed with needs in reach at end of session.  Therapy Documentation Precautions:  Precautions Precautions: Fall, Back Precaution Comments: reviewed spinal precautions Required Braces or Orthoses: Spinal Brace Spinal Brace: Lumbar corset, Applied in sitting position Restrictions Weight Bearing Restrictions: No    Therapy/Group: Individual Therapy   Excell Seltzer, PT, DPT  06/03/2019, 3:47 PM

## 2019-06-03 NOTE — Plan of Care (Signed)
  Problem: Consults Goal: RH SPINAL CORD INJURY PATIENT EDUCATION Description:  See Patient Education module for education specifics.  Outcome: Progressing Goal: Skin Care Protocol Initiated - if Braden Score 18 or less Description: If consults are not indicated, leave blank or document N/A Outcome: Progressing   Problem: SCI BOWEL ELIMINATION Goal: RH STG MANAGE BOWEL WITH ASSISTANCE Description: STG Manage Bowel with mod I Assistance. Outcome: Progressing Goal: RH STG SCI MANAGE BOWEL WITH MEDICATION WITH ASSISTANCE Description: STG SCI Manage bowel with medication with mod I assistance. Outcome: Progressing Goal: RH STG SCI MANAGE BOWEL PROGRAM W/ASSIST OR AS APPROPRIATE Description: STG SCI Manage bowel program mod I assist or as appropriate. Outcome: Progressing   Problem: RH SKIN INTEGRITY Goal: RH STG MAINTAIN SKIN INTEGRITY WITH ASSISTANCE Description: STG Maintain Skin Integrity With mod I Assistance. Outcome: Progressing   Problem: RH PAIN MANAGEMENT Goal: RH STG PAIN MANAGED AT OR BELOW PT'S PAIN GOAL Description: < 4 Outcome: Progressing

## 2019-06-04 ENCOUNTER — Inpatient Hospital Stay (HOSPITAL_COMMUNITY): Payer: 59 | Admitting: Physical Therapy

## 2019-06-04 ENCOUNTER — Inpatient Hospital Stay (HOSPITAL_COMMUNITY): Payer: 59 | Admitting: Occupational Therapy

## 2019-06-04 LAB — CBC WITH DIFFERENTIAL/PLATELET
Abs Immature Granulocytes: 0.3 10*3/uL — ABNORMAL HIGH (ref 0.00–0.07)
Basophils Absolute: 0 10*3/uL (ref 0.0–0.1)
Basophils Relative: 0 %
Eosinophils Absolute: 0 10*3/uL (ref 0.0–0.5)
Eosinophils Relative: 0 %
HCT: 26.4 % — ABNORMAL LOW (ref 36.0–46.0)
Hemoglobin: 8.8 g/dL — ABNORMAL LOW (ref 12.0–15.0)
Lymphocytes Relative: 1 %
Lymphs Abs: 0.1 10*3/uL — ABNORMAL LOW (ref 0.7–4.0)
MCH: 32.5 pg (ref 26.0–34.0)
MCHC: 33.3 g/dL (ref 30.0–36.0)
MCV: 97.4 fL (ref 80.0–100.0)
Monocytes Absolute: 0.6 10*3/uL (ref 0.1–1.0)
Monocytes Relative: 4 %
Myelocytes: 2 %
Neutro Abs: 13.4 10*3/uL — ABNORMAL HIGH (ref 1.7–7.7)
Neutrophils Relative %: 93 %
Platelets: 158 10*3/uL (ref 150–400)
RBC: 2.71 MIL/uL — ABNORMAL LOW (ref 3.87–5.11)
RDW: 17 % — ABNORMAL HIGH (ref 11.5–15.5)
WBC: 14.4 10*3/uL — ABNORMAL HIGH (ref 4.0–10.5)
nRBC: 0 % (ref 0.0–0.2)
nRBC: 0 /100 WBC

## 2019-06-04 LAB — COMPREHENSIVE METABOLIC PANEL
ALT: 44 U/L (ref 0–44)
AST: 29 U/L (ref 15–41)
Albumin: 2.1 g/dL — ABNORMAL LOW (ref 3.5–5.0)
Alkaline Phosphatase: 125 U/L (ref 38–126)
Anion gap: 11 (ref 5–15)
BUN: 11 mg/dL (ref 8–23)
CO2: 26 mmol/L (ref 22–32)
Calcium: 8.6 mg/dL — ABNORMAL LOW (ref 8.9–10.3)
Chloride: 95 mmol/L — ABNORMAL LOW (ref 98–111)
Creatinine, Ser: 0.55 mg/dL (ref 0.44–1.00)
GFR calc Af Amer: >60 mL/min (ref >60)
GFR calc non Af Amer: >60 mL/min (ref >60)
Glucose, Bld: 98 mg/dL (ref 70–99)
Potassium: 4.1 mmol/L (ref 3.5–5.1)
Sodium: 132 mmol/L — ABNORMAL LOW (ref 135–145)
Total Bilirubin: 0.2 mg/dL — ABNORMAL LOW (ref 0.3–1.2)
Total Protein: 5.4 g/dL — ABNORMAL LOW (ref 6.5–8.1)

## 2019-06-04 LAB — OCCULT BLOOD X 1 CARD TO LAB, STOOL: Fecal Occult Bld: NEGATIVE

## 2019-06-04 MED ORDER — OXYCODONE HCL 5 MG PO TABS
10.0000 mg | ORAL_TABLET | ORAL | Status: DC | PRN
  Administered 2019-06-04 – 2019-06-07 (×12): 10 mg via ORAL
  Filled 2019-06-04 (×12): qty 2

## 2019-06-04 MED ORDER — LINACLOTIDE 145 MCG PO CAPS
145.0000 ug | ORAL_CAPSULE | Freq: Every day | ORAL | Status: DC
  Administered 2019-06-05 – 2019-06-07 (×3): 145 ug via ORAL
  Filled 2019-06-04 (×3): qty 1

## 2019-06-04 MED ORDER — DEXAMETHASONE 2 MG PO TABS
1.0000 mg | ORAL_TABLET | Freq: Every day | ORAL | Status: DC
  Administered 2019-06-04 – 2019-06-06 (×3): 1 mg via ORAL
  Filled 2019-06-04 (×2): qty 1

## 2019-06-04 NOTE — Progress Notes (Signed)
Brief oncology note:  I stopped by to visit with the patient today but she was sleeping.  I did not wake her.  I spoke with nursing who anticipates that she may discharge at the end of this week.  She currently has an appointment at the cancer center on 9/24 but will still be admitted to inpatient rehab.  Message sent to [facility] scheduling to reschedule all appointments for next week.  Mikey Bussing, DNP, AGPCNP-BC, AOCNP

## 2019-06-04 NOTE — Progress Notes (Signed)
  Patient ID: Rebecca Lucas, female   DOB: May 29, 1956, 64 y.o.   MRN: [id number]    Diagnosis code:   C79.51; G83.4; C34.11; G95.29  Height:     5'7"  Weight:     140 lbs   Patient has secondary malignant neoplasm of bone; cauda equina syndrome; malignant neoplasm of upper lobe, right bronchus, or lung; cord compression which requires head and chest be positioned in ways not feasible with a normal bed.  Head must be elevated at least 30 degrees or pt has pain and difficulty with breathing.   Pt's head and torso require frequent and immediate changes in body position which cannot be achieved with a normal bed.

## 2019-06-04 NOTE — Progress Notes (Signed)
Physical Therapy Session Note  Patient Details  Name: Rebecca Lucas MRN: [id number] Date of Birth: 11-03-1955  Today's Date: 06/04/2019 PT Individual Time: 1000-1100 PT Individual Time Calculation (min): 60 min   Short Term Goals: Week 3:  PT Short Term Goal 1 (Week 3): Pt will complete transfers with mod A consistently PT Short Term Goal 2 (Week 3): Pt will perform sit to stand with mod A PT Short Term Goal 3 (Week 3): Pt will tolerate sitting up in chair x 1 hour  Skilled Therapeutic Interventions/Progress Updates:    Pt received sidelying in bed, agreeable to PT session. Pt reports ongoing back pain and R hip pain, not rated and received pain medication from RN at beginning of session. Sidelying to sitting EOB with mod A. Pt is dependent to don TED hose, shoes, and L AFO. Slide board transfer bed to w/c with min A. Manual w/c propulsion x 150 ft with use of BUE and Supervision. Slide board transfer w/c to car with mod A, mod A for BLE management in/out of car. Slide board transfer w/c to/from Nustep with min A. Nustep level 2, 2 x 5 min with use of B UE/LE with gait belt around LE for improved positioning. Pt requests to sit up in recliner at end of session. Slide board transfer w/c to recliner with mod A. Pt left seated in recliner in room with needs in reach at end of session.  Therapy Documentation Precautions:  Precautions Precautions: Fall, Back Precaution Comments: reviewed spinal precautions Required Braces or Orthoses: Spinal Brace Spinal Brace: Lumbar corset, Applied in sitting position Restrictions Weight Bearing Restrictions: No    Therapy/Group: Individual Therapy   Excell Seltzer, PT, DPT  06/04/2019, 12:57 PM

## 2019-06-04 NOTE — Progress Notes (Signed)
Patient pulse noted in 120s during the night. Patient is stable, no distress noted other vitals are WNL. Patient is showing yellow on MEWS. [name] PA notified of issue and stated he is aware. Staff will continue to monitor and assess.

## 2019-06-04 NOTE — Plan of Care (Signed)
Downgraded goals overall to mod A due to decrease in function over the past week due to onset of new medical issues. Also d/c gait goals due to functional decline.

## 2019-06-04 NOTE — Plan of Care (Signed)
Goals modified following pt's medical events and subsequent decline in function. Sit>stand,standing balance, and tub/shower goals d/c as not safe/appropriate at this time.  Bathing/dressing and toileting goals downgraded to mod A overall.  See POC for goal details. Rebecca Lucas, OTR/L

## 2019-06-04 NOTE — Progress Notes (Signed)
Rebecca Lucas PHYSICAL MEDICINE & REHABILITATION PROGRESS NOTE   Subjective/Complaints:   Pt reports back pain has really been a problem the last few days. Was wondering could increase dose- already at q4 hours prn, but just not enough- pain is new. Also, family can come in for training this week- including brother and sister- father had fall while rockclimbing (he's in 26s) and got 5 stitches in head.   ROS: Abd pain improved; worse back pain  Denies CP, shortness of breath.  Objective:   Ct Abdomen Pelvis W Contrast  Result Date: 06/03/2019 CLINICAL DATA:  Abdominal pain.  Rule out abdominal metastasis. EXAM: CT ABDOMEN AND PELVIS WITH CONTRAST TECHNIQUE: Multidetector CT imaging of the abdomen and pelvis was performed using the standard protocol following bolus administration of intravenous contrast. CONTRAST:  127mL OMNIPAQUE IOHEXOL 300 MG/ML  SOLN COMPARISON:  CT abdomen dated 05/27/2019. FINDINGS: Lower chest: New LEFT pleural effusion with adjacent compressive atelectasis. Small RIGHT pleural effusion. Hepatobiliary: 1.6 cm hypodense mass within the caudate lobe of the liver which is new compared to an earlier CT of 02/19/2019 (series 3, image 18). Gallbladder is contracted. No bile duct dilatation seen. Pancreas: Unremarkable. No pancreatic ductal dilatation or surrounding inflammatory changes. Spleen: Normal in size without focal abnormality. Adrenals/Urinary Tract: Adrenal glands are unremarkable. Kidneys are unremarkable without mass, stone or hydronephrosis. Bladder appears normal. Stomach/Bowel: No dilated large or small bowel loops. No evidence of bowel wall inflammation seen. Stomach is distended with recently ingested materials. Vascular/Lymphatic: Aortic atherosclerosis. No acute appearing vascular abnormality. No enlarged lymph nodes identified. Reproductive: Uterus and bilateral adnexa are unremarkable. Other: Small amount of ascites. Soft tissue mass at the crus of the RIGHT  hemidiaphragm, again measuring 2.8 cm greatest dimension, compatible with metastatic implants/extension. Musculoskeletal: Stable appearance of the pathologic compression fracture of L2. The mass at the crus of the RIGHT hemidiaphragm may be soft tissue extension of this vertebral body metastasis. IMPRESSION: 1. 1.6 cm hypodense mass within the caudate lobe of the liver, highly suspicious for a new liver metastasis. 2. Pathologic compression fracture of L2 vertebral body, as previously described. 3. Soft tissue mass at the crus of the RIGHT hemidiaphragm, measuring 2.8 cm, compatible with metastatic implant and/or soft tissue extension of the L2 vertebral body metastasis. 4. Small amount of ascites. 5. New LEFT pleural effusion.  Trace RIGHT pleural effusion. 6.  Aortic Atherosclerosis (ICD10-I70.0). Electronically Signed   By: Franki Cabot M.D.   On: 06/03/2019 15:18   Recent Labs    06/03/19 0520 06/04/19 0454  WBC 13.7* 14.4*  HGB 8.2* 8.8*  HCT 25.6* 26.4*  PLT 141* 158   Recent Labs    06/03/19 0520 06/04/19 0454  NA 132* 132*  K 4.3 4.1  CL 98 95*  CO2 25 26  GLUCOSE 106* 98  BUN 12 11  CREATININE 0.72 0.55  CALCIUM 8.1* 8.6*    Intake/Output Summary (Last 24 hours) at 06/04/2019 1344 Last data filed at 06/04/2019 0804 Gross per 24 hour  Intake 340 ml  Output 2600 ml  Net -2260 ml     Physical Exam: Vital Signs Blood pressure 122/75, pulse (!) 123, temperature 98.6 F (37 C), resp. rate 17, height 5\' 7"  (1.702 m), weight 63.9 kg, SpO2 95 %. Constitutional: No distress . Vital signs and labs reviewed. Lying supine in bed; appears in pain, squirming from back pain, NAD HENT: Normocephalic.  Atraumatic. Eyes: EOMI. No discharge. Cardiovascular: tachycardic, regular rhythm Respiratory: CTA B/L. Denies SOB, sats 95%  on RA GI: some distension; softer; mild TTP diffusely Skin: Warm and dry.  Intact. Psych: Flat. Musc: No edema in extremities. TTP over R trochanteric bursa  c/w bursitis Neuro: Alert Motor: Bilateral upper extremities: 5/5 proximal distal Right lower extremity: 3-/5 proximal distal, unchanged, unchanged Left lower extremity: 0/5 proximal to distal, unchanged  Assessment/Plan: 1. 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  Physiatrist is providing close team supervision and 24 hour management of active medical problems listed below.  Physiatrist and rehab team continue to assess barriers to discharge/monitor patient progress toward functional and medical goals  Care Tool:  Bathing  Bathing activity did not occur: Refused Body parts bathed by patient: Right arm, Left arm, Chest, Abdomen, Front perineal area, Right upper leg, Left upper leg, Face, Buttocks, Right lower leg, Left lower leg   Body parts bathed by helper: Buttocks, Right lower leg, Left lower leg     Bathing assist Assist Level: Set up assist     Upper Body Dressing/Undressing Upper body dressing   What is the patient wearing?: Pull over shirt, Orthosis Orthosis activity level: Performed by patient  Upper body assist Assist Level: Set up assist    Lower Body Dressing/Undressing Lower body dressing      What is the patient wearing?: Underwear/pull up, Pants     Lower body assist Assist for lower body dressing: Minimal Assistance - Patient > 75%     Toileting Toileting Toileting Activity did not occur Landscape architect and hygiene only): Refused  Toileting assist Assist for toileting: Total Assistance - Patient < 25%     Transfers Chair/bed transfer  Transfers assist     Chair/bed transfer assist level: Minimal Assistance - Patient > 75%     Locomotion Ambulation   Ambulation assist      Assist level: Contact Guard/Touching  assist Assistive device: Walker-rolling Max distance: 45'   Walk 10 feet activity   Assist     Assist level: Contact Guard/Touching assist Assistive device: Walker-rolling   Walk 50 feet activity   Assist Walk 50 feet with 2 turns activity did not occur: Safety/medical concerns         Walk 150 feet activity   Assist Walk 150 feet activity did not occur: Safety/medical concerns         Walk 10 feet on uneven surface  activity   Assist Walk 10 feet on uneven surfaces activity did not occur: Safety/medical concerns         Wheelchair     Assist Will patient use wheelchair at discharge?: Yes Type of Wheelchair: Manual Wheelchair activity did not occur: Safety/medical concerns  Wheelchair assist level: Supervision/Verbal cueing Max wheelchair distance: 150    Wheelchair 50 feet with 2 turns activity    Assist    Wheelchair 50 feet with 2 turns activity did not occur: Safety/medical concerns   Assist Level: Supervision/Verbal cueing   Wheelchair 150 feet activity     Assist    Wheelchair 150 feet activity did not occur: Safety/medical concerns   Assist Level: Supervision/Verbal cueing   Medical Problem List and Plan: 1.Decreased functional mobilitysecondary to 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   Continue CIR -Back brace when out of bed. -Plan is to follow-up outpatient Dr. Earlie Server in regards to chemotherapy  Chemotherapy was to begin on 9/17, however now on hold  2. Antithrombotics: -DVT/anticoagulation:SCDs. Lovenox  LE venous dopplers negative for DVT -antiplatelet therapy: N/A 3. Pain  Management:  Neurontin 300 mg 3 times daily  Continue patch 75 mcg, Advil 400 mg  Ibuprofen changed to PRN on 9/5, DC'd on 9/17 due to EGD findings and discussion with hospitalist  9/16- oxy is q4 hours prn   Lidoderm patch added on 9/17 to back with now mild improvement  Robaxin 500 3 times daily as needed started on 9/20  9/21- R trochanteric bursitis- somewhat improved with robaxin and lidocaine patch  9/22- increased oxycodone to 10 mg q4 hours prn 4. Mood:motivational support -antipsychotic agents: N/A 5. Neuropsych: This patientiscapable of making decisions on herown behalf. 6. Skin/Wound Care:Routine skin checks 7. Fluids/Electrolytes/Nutrition:Routine in and outs  P.o. intake improving, IVF DC'd on 9/20 8. Acute blood loss anemia.   Hemoglobin 8.2 on 9/20, labs ordered for tomorrow  Hemoccult ordered  Continue to await further GI recs  Continue to monitor 9. Leukocytosis . Continue to monitor  WBCs 12.6 on 9/20  9/21- WBC up to 13.7- IM ordering CT of abd/pelvis  9/22- WBC up to 14.4- pulse 123- sats 95-97%- she feels well- no complaints except back pain- will monitor closely.   Afebrile  Continue to monitor 10. Urinary retention/acute lower UTI:  Keflex, started on 9/13, changed to [place] on 9/17, [place] on 9/20 11. Constipation in setting of Neurogenic bowel and opioid-induced constipation. Adjust bowel program as needed  Bowel program with dig stim at night/evening.   Linzess changed to [place] on 9/17, [place] by hospitalist-may need to resume  Miralax DC'd  Appears to be improving  KUB personally reviewed, showing improvement since suggestion of ileus on 9/17  Improving on 9/20.  9/22- with increase in oxycodone, will restart Linzess.  12.  Steroid-induced hyperglycemia  Continue to monitor  Elevated on 9/20 13. Severe abd pain  CT showed ascites and gallbladder sludge- GI consulted- ordered U/S guided paracentesis- GI ordered associated labs     EGD performed by GI, appreciate recs  PPI twice daily x8 weeks then daily as long as patient requires steroids  Await cytology results  Hospitalist following, appreciate recs, notes reviewed  Diet changed back to regular diet after being on liquids on 9/17  Improved 14.  Sleep disturbance  Trazodone 50 nightly as needed  15.  Hyponatremia  Sodium 133 on 9/19, labs ordered for tomorrow  Continue to monitor 16.  Transaminitis  LFTs elevated on 9/18, labs ordered for tomorrow  9/21- Slightly elevated, but improving- ALT 48; AST 30  Continue to monitor 17.  Oral candidiasis  Noted on EGD  Diflucan 100 mg daily x7 days per GI, started on 9/16 18.  Tachycardia  Likely secondary to pain  ECG reviewed, limited  Low-dose metoprolol started on 9/18  First troponin borderline negative, repeat negative 19.  Hypokalemia  Potassium 4.5 on 9/20, labs ordered for tomorrow  Supplement initiated, [place] on 9/20  Magnesium 1.7 on 9/20 20.  Thrombocytopenia  Platelets 126 on 9/20  HIT pending 21.  Hypophosphatemia  Noted to have critical value, supplement initiated  Repeat labs pending, also will ordered for tomorrow  9/21- 2.8- which is in normal levels  22. Dispo  9/21- will discuss with pt/husband/team  9/22- d/w Dr Earlie Server-  will send home Friday- pt/SW aware  Labs TH  LOS: 21 days A FACE TO FACE EVALUATION WAS PERFORMED  Rebecca Lucas 06/04/2019, 1:44 PM

## 2019-06-04 NOTE — Progress Notes (Signed)
Occupational Therapy Session Note  Patient Details  Name: Rebecca Lucas MRN: [id number] Date of Birth: 04/13/1956  Today's Date: 06/04/2019 OT Individual Time: 1345-1500 OT Individual Time Calculation (min): 75 min    Short Term Goals: Week 3:  OT Short Term Goal 1 (Week 3): STG=LTG Due to LOS  Skilled Therapeutic Interventions/Progress Updates:    Pt seen for OT ADL bathing/dressing session. Pt in supine upon arrival, requesting to be re-positioned in side-lying. Min A to reposition with use of hospital bed functions. While pt resting in supine, discussed at length, d/c planning, scheduling family ed for pt's sister to come in on Thursday for hands on training, modified ADLs, and d/c planning.  Pt agreeable to try bathing/dressing from bed level. Used hospital bed functions to reposition. She was unable to come into long sitting position 2/2 pain and core weakness. She was able to bring R LE into chest to wash, required max A to wash L LE. She rolled with min A in order to have brief changed, noted to have been incontinent of stool. Pt unable to feel when hygiene being performed.  Pt left in side-lying at end of session, all needs in reach and repositioned with pillows for comfort.  Extensive education/discussion throughout session regarding directing caregivers, participation in meaningful ADLs/IADLs, modified ADLs, energy conservation, DME, and d/c planning.   Therapy Documentation Precautions:  Precautions Precautions: Fall, Back Precaution Comments: reviewed spinal precautions Required Braces or Orthoses: Spinal Brace Spinal Brace: Lumbar corset, Applied in sitting position Restrictions Weight Bearing Restrictions: No   Therapy/Group: Individual Therapy  Averie Hornbaker L 06/04/2019, 7:00 AM

## 2019-06-04 NOTE — Plan of Care (Signed)
  Problem: Consults Goal: RH SPINAL CORD INJURY PATIENT EDUCATION Description:  See Patient Education module for education specifics.  Outcome: Progressing Goal: Skin Care Protocol Initiated - if Braden Score 18 or less Description: If consults are not indicated, leave blank or document N/A Outcome: Progressing   Problem: SCI BOWEL ELIMINATION Goal: RH STG MANAGE BOWEL WITH ASSISTANCE Description: STG Manage Bowel with mod I Assistance. Outcome: Progressing Goal: RH STG SCI MANAGE BOWEL WITH MEDICATION WITH ASSISTANCE Description: STG SCI Manage bowel with medication with mod I assistance. Outcome: Progressing Goal: RH STG SCI MANAGE BOWEL PROGRAM W/ASSIST OR AS APPROPRIATE Description: STG SCI Manage bowel program mod I assist or as appropriate. Outcome: Progressing   Problem: RH SKIN INTEGRITY Goal: RH STG MAINTAIN SKIN INTEGRITY WITH ASSISTANCE Description: STG Maintain Skin Integrity With mod I Assistance. Outcome: Progressing   Problem: RH PAIN MANAGEMENT Goal: RH STG PAIN MANAGED AT OR BELOW PT'S PAIN GOAL Description: < 4 Outcome: Progressing

## 2019-06-05 ENCOUNTER — Inpatient Hospital Stay (HOSPITAL_COMMUNITY): Payer: 59 | Admitting: Occupational Therapy

## 2019-06-05 ENCOUNTER — Inpatient Hospital Stay (HOSPITAL_COMMUNITY): Payer: 59

## 2019-06-05 ENCOUNTER — Inpatient Hospital Stay (HOSPITAL_COMMUNITY): Payer: 59 | Admitting: Physical Therapy

## 2019-06-05 LAB — PATHOLOGIST SMEAR REVIEW: Path Review: 9222020

## 2019-06-05 MED ORDER — GADOBUTROL 1 MMOL/ML IV SOLN
6.4000 mL | Freq: Once | INTRAVENOUS | Status: AC | PRN
  Administered 2019-06-05: 23:00:00 6.4 mL via INTRAVENOUS

## 2019-06-05 NOTE — Plan of Care (Signed)
  Problem: SCI BOWEL ELIMINATION Goal: RH STG MANAGE BOWEL WITH ASSISTANCE Description: STG Manage Bowel with min Assistance. Outcome: Progressing   Problem: RH SKIN INTEGRITY Goal: RH STG MAINTAIN SKIN INTEGRITY WITH ASSISTANCE Description: STG Maintain Skin Integrity With min Assistance. Outcome: Progressing   Problem: RH PAIN MANAGEMENT Goal: RH STG PAIN MANAGED AT OR BELOW PT'S PAIN GOAL Description: < 4 Outcome: Not Progressing

## 2019-06-05 NOTE — Discharge Summary (Signed)
Physician Discharge Summary  Patient ID: SAMANTHAMARIE EZZELL MRN: [id number] DOB/AGE: Oct 27, 1955 63 y.o.  Admit date: 05/14/2019 Discharge date: 06/07/2019  Discharge Diagnoses:  Principal Problem:   Cauda equina syndrome [facility]) Active Problems:   Foot drop, left   Neurogenic bowel   Bandemia   Steroid-induced hyperglycemia   Leucocytosis   Acute blood loss anemia   Postoperative pain   Abdominal pain   Abdominal distension   Ascites   Therapeutic opioid induced constipation   Oral candidiasis   Transaminitis   Hyponatremia   Acute lower UTI   Cancer related pain   Hypokalemia   Sinus tachycardia   Thrombocytopenia (HCC)   Hypophosphatemia hypokalemia  Discharged Condition: Stable  Significant Diagnostic Studies: Ct Abdomen Pelvis Wo Contrast  Result Date: 05/27/2019 CLINICAL DATA:  Abdominal pain. Gastroenteritis or colitis suspected. History of metastatic lung cancer. EXAM: CT ABDOMEN AND PELVIS WITHOUT CONTRAST TECHNIQUE: Multidetector CT imaging of the abdomen and pelvis was performed following the standard protocol without IV contrast. COMPARISON:  02/19/2019. FINDINGS: Lower chest: Small bilateral pleural effusions. Hepatobiliary: No focal liver abnormality identified. There is mild pericholecystic fluid. There is sludge identified layering within the gallbladder. No stones noted. No bile duct dilatation. Pancreas: Unremarkable. No pancreatic ductal dilatation or surrounding inflammatory changes. Spleen: Normal in size without focal abnormality. Adrenals/Urinary Tract: Normal appearance of the adrenal glands. No kidney mass or hydronephrosis identified bilaterally. There is moderate distension of the urinary bladder without focal abnormality. Stomach/Bowel: The stomach is normal. Within the limitations of unenhanced technique there is no small bowel wall thickening, inflammation or distension. The appendix is visualized and appears normal. No colonic wall thickening, inflammation  or distension. Vascular/Lymphatic: Aortic atherosclerosis without aneurysm. Similar appearance prevertebral soft tissue thickening and retroperitoneal fat stranding associated with the L2 compression deformity. Asymmetric soft tissue thickening of the right crus of diaphragm which is contiguous with the soft tissue thickening around L2 is again noted. No retroperitoneal adenopathy. No pelvic or inguinal adenopathy. Reproductive: Uterus and bilateral adnexa are unremarkable. Other: New small to moderate volume of ascites identified within the abdomen and pelvis. Musculoskeletal: Status post posterior hardware fixation at T12 through L4. Laminectomy at L2 is again noted. Similar appearance of pathologic compression deformity involving the L2 vertebra. Similar appearance of metastasis to L1, L2 and L3 vertebra. Unchanged right sacral wing metastasis. IMPRESSION: 1. Interval development of small to moderate volume of ascites within the abdomen and pelvis. Etiology indeterminate. 2. L1 through L3 vertebral metastasis with pathologic fracture involving the L2 vertebra. Abnormal increased prevertebral soft tissue and retroperitoneal soft tissue stranding is again noted which appears similar to the CT of the lumbar spine from 05/08/2019. Associated asymmetric soft tissue thickening of the right crus of diaphragm is similar to 05/08/19 but new from CT AP from 02/19/2019. This area is incompletely characterized without IV contrast material. 3. Gallbladder sludge. Electronically Signed   By: Kerby Moors M.D.   On: 05/27/2019 14:20   Dg Thoracolumabar Spine  Result Date: 05/09/2019 CLINICAL DATA:  Posterior fusion EXAM: DG C-ARM 1-60 MIN; THORACOLUMBAR SPINE - 2 VIEW COMPARISON:  CT 08/08/2019 FINDINGS: Short intraoperative spot images demonstrate fusion across the L2 compression fracture from T12 to L4. No hardware complicating feature. IMPRESSION: Posterior fusion as above.  No visible complicating feature.  Electronically Signed   By: Rolm Baptise M.D.   On: 05/09/2019 15:57   Dg Abd 1 View  Result Date: 05/31/2019 CLINICAL DATA:  Abdominal pain. EXAM: ABDOMEN - 1 VIEW COMPARISON:  05/30/2019. FINDINGS: Previously identified dilated bowel loops over the right abdomen has improved. No prominent bowel distention on today's exam. No free air. Bladder distention cannot be excluded. Prior lumbar spine fusion. Hardware intact. No acute bony abnormality IMPRESSION: 1. Previously identified dilated bowel and has improved. No prominent bowel distention on today's exam. 2.  Bladder distention cannot be excluded. Electronically Signed   By: Marcello Moores  Register   On: 05/31/2019 07:28   Dg Abd 1 View  Result Date: 05/30/2019 CLINICAL DATA:  Acute generalized abdominal pain. EXAM: ABDOMEN - 1 VIEW COMPARISON:  Radiographs of May 29, 2019. CT scan of May 27, 2019. FINDINGS: Probable dilated large or small bowel loop is noted in right lower quadrant, most consistent with focal ileus. No abnormal calcifications are noted. Postsurgical changes are seen involving the lumbar spine. IMPRESSION: Probable dilated large or small bowel loop is noted in right lower quadrant, most consistent with focal ileus. Electronically Signed   By: Marijo Conception M.D.   On: 05/30/2019 13:38   Dg Abd 1 View  Result Date: 05/29/2019 CLINICAL DATA:  Abdominal distension, history of ulcer EXAM: ABDOMEN - 1 VIEW COMPARISON:  Radiograph 05/27/2019, CT abdomen pelvis 05/27/2019 FINDINGS: The bowel gas pattern is normal. Evaluation for free intraperitoneal air is limited on this supine only exam. Postsurgical changes from prior T12-L4 thoracolumbar fusion. Irregular sclerosis of the upper lumbar levels compatible with osseous metastatic disease seen on most recent comparison CT. Stable appearance of the pathologic fracture of L2 when compared to prior. Radio-opaque calculi or other significant radiographic abnormality are seen. IMPRESSION:  Nonobstructive bowel gas pattern. Known osseous metastatic disease is better seen on prior CT. Unchanged appearance of the L2 pathologic compression deformity. Electronically Signed   By: Lovena Le M.D.   On: 05/29/2019 20:07   Dg Abd 1 View  Result Date: 05/27/2019 CLINICAL DATA:  Abdominal pain EXAM: ABDOMEN - 1 VIEW COMPARISON:  CT 05/08/2019 FINDINGS: The bowel gas pattern is normal. No radio-opaque calculi or other significant radiographic abnormality are seen. Lumbar spine. Posterior fusion changes in the IMPRESSION: No acute findings. Electronically Signed   By: Rolm Baptise M.D.   On: 05/27/2019 10:44   Ct Lumbar Spine Wo Contrast  Result Date: 05/08/2019 CLINICAL DATA:  Metastatic cancer lumbar spine. Preop evaluation. Lung cancer. EXAM: CT LUMBAR SPINE WITHOUT CONTRAST TECHNIQUE: Multidetector CT imaging of the lumbar spine was performed without intravenous contrast administration. Multiplanar CT image reconstructions were also generated. COMPARISON:  Lumbar MRI 02/04/2019 FINDINGS: Segmentation: Normal Alignment: Mild anterolisthesis L1-2 related to fracture. Otherwise normal alignment. Vertebrae: Metastatic disease L1, L2, and L3 vertebral bodies as noted on MRI. Severe pathologic fracture of L2. Tumor is prominently sclerotic in appearance by CT. There is destructive change in the anterior superior L3 vertebral body. Pathologic fracture of the pedicle of L2 bilaterally. Nonpathologic appearing fracture of the right L2 transverse process. Stable metastatic disease in the right sacral ala. Paraspinal and other soft tissues: Extensive paraspinous soft tissue thickening around the pathologic fracture of L1 compatible with hemorrhage and tumor extending into the soft tissues and retroperitoneum. No enlarged lymph nodes in the retroperitoneum. Disc levels: T12-L1: Negative L1-2: Epidural tumor in the spinal canal left greater than right is better seen by MRI than CT. There is tumor in the left  foramen with left foraminal encroachment as well as moderate spinal stenosis as noted on MRI. L2-3: Epidural tumor extending to the left is best seen on MRI and is extending the foramen  causing left foraminal encroachment. Moderate spinal stenosis. L3-4: Mild disc bulging without stenosis L4-5: Negative L5-S1: Negative IMPRESSION: Metastatic disease L1, L2, and L3 vertebral bodies with pathologic fracture of L2. Epidural tumor in the canal central and left-sided at L1-2 and L2-3 causing spinal stenosis best visualized on MRI. Pathologic fracture L2 pedicle bilaterally. Nonpathologic fracture right L2 transverse process Metastatic disease right sacral ala stable. Electronically Signed   By: Franchot Gallo M.D.   On: 05/08/2019 13:18   Mr Cervical Spine W Wo Contrast  Result Date: 06/06/2019 CLINICAL DATA:  Follow-up examination for metastatic spine tumor. EXAM: MRI CERVICAL SPINE WITHOUT AND WITH CONTRAST TECHNIQUE: Multiplanar and multiecho pulse sequences of the cervical spine, to include the craniocervical junction and cervicothoracic junction, were obtained without and with intravenous contrast. CONTRAST:  6.65mL GADAVIST GADOBUTROL 1 MMOL/ML IV SOLN COMPARISON:  Prior MRI from 04/06/2019. FINDINGS: Alignment: Trace 2 mm anterolisthesis of C2 on C3, stable. Straightening with slight reversal of the normal underlying cervical lordosis. Vertebrae: Enhancing osseous metastatic lesion involving the posterior elements of C2 and C3 is relatively unchanged from previous exam. No interval increase in size or encroachment upon the adjacent spinal canal. No new osseous lesions. Underlying bone marrow signal intensity remains within normal limits. Cord: Signal intensity within the cervical spinal cord is normal. Normal cord caliber and morphology. No intramedullary or epidural enhancement. Posterior Fossa, vertebral arteries, paraspinal tissues: Visualized brain and posterior fossa of demonstrates no significant finding.  Negative craniocervical junction. Paraspinous and prevertebral soft tissues within normal limits. Normal intravascular flow voids noted within the vertebral arteries bilaterally. Intraluminal fluid noted within the partially visualized mid esophagus. Disc levels: C2-C3: Negative interspace.  Mild facet hypertrophy.  No stenosis. C3-C4: Small left paracentral disc protrusion, unchanged. No canal or foraminal stenosis. C4-C5: Mild disc bulging with uncovertebral hypertrophy. No significant canal or foraminal stenosis. C5-C6: Chronic diffuse degenerative disc osteophyte with intervertebral disc space narrowing. Mild facet hypertrophy. Flattening of the ventral thecal sac without significant spinal stenosis. Unchanged mild left foraminal stenosis. Right neural foramen remains patent. C6-C7:  Small central disc protrusion, unchanged.  No stenosis. C7-T1:  Unremarkable. Visualized upper thoracic spine demonstrates no significant finding. IMPRESSION: 1. No significant interval change in osseous metastatic disease involving the posterior elements of C2 and C3. No encroachment upon the adjacent spinal canal. 2. No evidence for new or progressive metastatic disease within the cervical spine. 3. Mild multilevel cervical spondylosis as above, stable. Electronically Signed   By: Jeannine Boga M.D.   On: 06/06/2019 00:02   Mr Lumbar Spine W Wo Contrast  Result Date: 06/06/2019 CLINICAL DATA:  Worsening left lower extremity weakness and bladder/bowel incontinence. 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 EXAM: MRI LUMBAR SPINE WITHOUT AND WITH CONTRAST TECHNIQUE: Multiplanar and multiecho pulse sequences of the lumbar spine were obtained without and with intravenous contrast. CONTRAST:  6.68mL GADAVIST GADOBUTROL 1 MMOL/ML IV SOLN COMPARISON:  CT abdomen pelvis dated June 03, 2019. MRI lumbar spine dated May 07, 2019. FINDINGS: Segmentation:  Standard. Alignment:  Unchanged mild retropulsion of L2. Vertebrae: Interval posterior decompression at L2 with T12-L4 posterior fusion. Tumor involving the L1, L2, and L3 vertebral bodies is largely unchanged. Anterior posterior paravertebral tumor extension has decreased however. Unchanged severe pathologic L2 compression fracture. Conus medullaris and cauda equina: Conus extends to the L1 level. There is new abnormal increased T2 signal within the lower spinal cord, extending from T11-T12  to the conus. There is progressive diffuse leptomeningeal thickening and enhancement of the lower spinal cord and cauda equina nerve roots. Paraspinal and other soft tissues: The right sacral ala and left posterior iliac metastases are stable in size. Disc levels: T12-L1: Unchanged small left paracentral disc protrusion. No stenosis. L1-L2: Decreased epidural tumor with mild residual tumor on the left. Improved now mild spinal canal stenosis and moderate left lateral recess stenosis. Decreased epidural tumor in the left neural foramen. L2-L3: Decreased epidural tumor with mild residual tumor on the left. Improved now mild spinal canal stenosis. Decreased epidural tumor in the left neural foramen. L3-L4:  Negative. L4-L5:  Negative. L5-S1:  Negative. IMPRESSION: 1. Progressive diffuse leptomeningeal thickening and enhancement of the lower spinal cord and cauda equina nerve roots, consistent with worsening metastatic disease. New spinal cord edema extending from T11-T12 to the conus. 2. Interval posterior decompression at L2 with T12-L4 posterior fusion. Decreased epidural tumor at L1-L2 and L2-L3 with improved spinal canal stenosis. 3. Grossly unchanged metastases involving the L1, L2, and L3 vertebral bodies with slightly decreased anterior paravertebral extension. 4. Grossly unchanged right sacral ala and left posterior iliac metastases. Electronically Signed   By: Titus Dubin M.D.   On:  06/06/2019 13:17   Ct Abdomen Pelvis W Contrast  Result Date: 06/03/2019 CLINICAL DATA:  Abdominal pain.  Rule out abdominal metastasis. EXAM: CT ABDOMEN AND PELVIS WITH CONTRAST TECHNIQUE: Multidetector CT imaging of the abdomen and pelvis was performed using the standard protocol following bolus administration of intravenous contrast. CONTRAST:  197mL OMNIPAQUE IOHEXOL 300 MG/ML  SOLN COMPARISON:  CT abdomen dated 05/27/2019. FINDINGS: Lower chest: New LEFT pleural effusion with adjacent compressive atelectasis. Small RIGHT pleural effusion. Hepatobiliary: 1.6 cm hypodense mass within the caudate lobe of the liver which is new compared to an earlier CT of 02/19/2019 (series 3, image 18). Gallbladder is contracted. No bile duct dilatation seen. Pancreas: Unremarkable. No pancreatic ductal dilatation or surrounding inflammatory changes. Spleen: Normal in size without focal abnormality. Adrenals/Urinary Tract: Adrenal glands are unremarkable. Kidneys are unremarkable without mass, stone or hydronephrosis. Bladder appears normal. Stomach/Bowel: No dilated large or small bowel loops. No evidence of bowel wall inflammation seen. Stomach is distended with recently ingested materials. Vascular/Lymphatic: Aortic atherosclerosis. No acute appearing vascular abnormality. No enlarged lymph nodes identified. Reproductive: Uterus and bilateral adnexa are unremarkable. Other: Small amount of ascites. Soft tissue mass at the crus of the RIGHT hemidiaphragm, again measuring 2.8 cm greatest dimension, compatible with metastatic implants/extension. Musculoskeletal: Stable appearance of the pathologic compression fracture of L2. The mass at the crus of the RIGHT hemidiaphragm may be soft tissue extension of this vertebral body metastasis. IMPRESSION: 1. 1.6 cm hypodense mass within the caudate lobe of the liver, highly suspicious for a new liver metastasis. 2. Pathologic compression fracture of L2 vertebral body, as previously  described. 3. Soft tissue mass at the crus of the RIGHT hemidiaphragm, measuring 2.8 cm, compatible with metastatic implant and/or soft tissue extension of the L2 vertebral body metastasis. 4. Small amount of ascites. 5. New LEFT pleural effusion.  Trace RIGHT pleural effusion. 6.  Aortic Atherosclerosis (ICD10-I70.0). Electronically Signed   By: Franki Cabot M.D.   On: 06/03/2019 15:18   Dg C-arm 1-60 Min  Result Date: 05/09/2019 CLINICAL DATA:  Posterior fusion EXAM: DG C-ARM 1-60 MIN; THORACOLUMBAR SPINE - 2 VIEW COMPARISON:  CT 08/08/2019 FINDINGS: Short intraoperative spot images demonstrate fusion across the L2 compression fracture from T12 to L4. No hardware complicating feature.  IMPRESSION: Posterior fusion as above.  No visible complicating feature. Electronically Signed   By: Rolm Baptise M.D.   On: 05/09/2019 15:57   Ir Abdomen US Limited  Result Date: 05/28/2019 CLINICAL DATA:  63 year old female with abdominal distension. She presents for possible paracentesis. EXAM: LIMITED ABDOMEN ULTRASOUND FOR ASCITES TECHNIQUE: Limited ultrasound survey for ascites was performed in all four abdominal quadrants. COMPARISON:  CT abdomen/pelvis 05/27/2019 FINDINGS: Ultrasound was used to interrogate the 4 quadrants of the abdomen. No drainable ascites is visualized. IMPRESSION: No ascites visualized by ultrasound. Paracentesis was deferred. Electronically Signed   By: Jacqulynn Cadet M.D.   On: 05/28/2019 17:17   Vas [place] Lower Extremity Venous (dvt)  Result Date: 05/14/2019  Lower Venous Study Indications: Edema.  Comparison Study: no prior Performing Technologist: Abram Sander RVS  Examination Guidelines: A complete evaluation includes B-mode imaging, spectral Doppler, color Doppler, and power Doppler as needed of all accessible portions of each vessel. Bilateral testing is considered an integral part of a complete examination. Limited examinations for reoccurring indications may be performed as noted.   +---------+---------------+---------+-----------+----------+--------------+ RIGHT    CompressibilityPhasicitySpontaneityPropertiesThrombus Aging +---------+---------------+---------+-----------+----------+--------------+ CFV      Full           Yes      Yes                                 +---------+---------------+---------+-----------+----------+--------------+ SFJ      Full                                                        +---------+---------------+---------+-----------+----------+--------------+ FV Prox  Full                                                        +---------+---------------+---------+-----------+----------+--------------+ FV Mid   Full                                                        +---------+---------------+---------+-----------+----------+--------------+ FV DistalFull                                                        +---------+---------------+---------+-----------+----------+--------------+ PFV      Full                                                        +---------+---------------+---------+-----------+----------+--------------+ POP      Full           Yes      Yes                                 +---------+---------------+---------+-----------+----------+--------------+  PTV      Full                                                        +---------+---------------+---------+-----------+----------+--------------+ PERO     Full                                                        +---------+---------------+---------+-----------+----------+--------------+   +---------+---------------+---------+-----------+----------+--------------+ LEFT     CompressibilityPhasicitySpontaneityPropertiesThrombus Aging +---------+---------------+---------+-----------+----------+--------------+ CFV      Full           Yes      Yes                                  +---------+---------------+---------+-----------+----------+--------------+ SFJ      Full                                                        +---------+---------------+---------+-----------+----------+--------------+ FV Prox  Full                                                        +---------+---------------+---------+-----------+----------+--------------+ FV Mid   Full                                                        +---------+---------------+---------+-----------+----------+--------------+ FV DistalFull                                                        +---------+---------------+---------+-----------+----------+--------------+ PFV      Full                                                        +---------+---------------+---------+-----------+----------+--------------+ POP      Full           Yes      Yes                                 +---------+---------------+---------+-----------+----------+--------------+ PTV      Full                                                        +---------+---------------+---------+-----------+----------+--------------+  PERO                                                  Not visualized +---------+---------------+---------+-----------+----------+--------------+     Summary: Right: There is no evidence of deep vein thrombosis in the lower extremity. No cystic structure found in the popliteal fossa. Left: There is no evidence of deep vein thrombosis in the lower extremity. No cystic structure found in the popliteal fossa.  *See table(s) above for measurements and observations. Electronically signed by Harold Barban MD on 05/14/2019 at 3:19:19 PM.    Final     Labs:  Basic Metabolic Panel: Recent Labs  Lab 06/01/19 0305 06/01/19 1643 06/02/19 1208 06/03/19 0520 06/04/19 0454 06/06/19 0419  NA  --  133*  --  132* 132* 133*  K  --  4.5  --  4.3 4.1 3.4*  CL  --  103  --  98 95* 98  CO2  --  21*   --  25 26 23   GLUCOSE  --  133*  --  106* 98 108*  BUN  --  10  --  12 11 24*  CREATININE  --  0.47  --  0.72 0.55 0.54  CALCIUM  --  7.7*  --  8.1* 8.6* 8.1*  MG 1.9 1.7  --   --   --   --   PHOS  --  <1.0* 2.2* 2.8  --  2.8    CBC: Recent Labs  Lab 06/03/19 0520 06/04/19 0454 06/06/19 0419  WBC 13.7* 14.4* 16.4*  NEUTROABS 11.9* 13.4* 15.9*  HGB 8.2* 8.8* 8.2*  HCT 25.6* 26.4* 25.2*  MCV 98.8 97.4 99.6  PLT 141* 158 156    CBG: No results for input(s): GLUCAP in the last 168 hours.  Family history.  Mother with ALS.  Negative for colon cancer or diabetes mellitus  Brief HPI:   ANAYSIA GERMER is a 63 y.o. 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  Per chart review lives with spouse needed assistance with basic mobility.  Husband is a local MD.  Presented 05/07/2019 with urinary retention x4 to 5 days as well as weakness and numbness in her left lower extremity but was able to ambulate with a walker for short distances and later using a wheelchair.  She had been on oral dexamethasone which plan was to wean off.  COVID negative, urine culture no growth, alkaline phosphatase 200, WBC 14,700.  X-rays and imaging revealed progressed and bulky spinal and paraspinal tumor L1-L3 encompassing 78 x 62 x 67 mm.  Subtotal involvement of the L2 vertebrae increased as compared to prior tracings.  Circumferential epidural tumor substantially increased resulting in moderate to severe malignant spinal and severe left lateral recess stenosis.  Epidural tumor completely infiltrated the left L1 and L2 neural foramina partial erosion of the anterior endplates of L1 and L3 since April.  Stable pathologic compression fractures of L2 with mild retropulsion.  Visible right sacral  and left iliac bone metastasis appears stable.  Underwent L2 laminectomy, bilateral pediculotomy for decompression of the thecal sac and nerve roots with posterior segmental instrumentation T12-L4 and posterior lateral arthrodesis L1-L3 05/09/2019  per Dr. Kathyrn Sheriff.  Hospital course pain management.  Back brace when out of bed applied in sitting position.  Acute blood loss anemia 6.8 she was transfused with latest hemoglobin 9.7.  Reactive leukocytosis due to systemic steroids and monitored.  Hematology follow-up with ongoing plan for chemotherapy as outpatient.  Patient was admitted for a comprehensive rehab program   Hospital Course: WELTHA CATHY was admitted to rehab 05/14/2019 for inpatient therapies to consist of PT, ST and OT at least three hours five days a week. Past admission physiatrist, therapy team and rehab RN have worked together to provide customized collaborative inpatient rehab.  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 Kathyrn Sheriff.  Back brace when out of bed.  Plan was to follow-up outpatient Dr. Earlie Server in regards to chemotherapy.  She was weaned from her Decadron.  She did have some leukocytosis approximately 16,000 felt to be induced by steroids that was weaned to off.  MRI lumbar spine completed 06/05/2019 showing progressive diffuse leptomeningeal thickening and enhancement of the lower spinal cord and cauda equina nerve roots consistent with worsening metastatic disease there was some new spinal cord edema extending from T11-T12 to the conus.  Neurosurgery was consulted Dr. Kathyrn Sheriff to review this film did not feel anything new to add at this time but recommendations remained to follow-up  outpatient oncology services. Subcutaneous Lovenox for DVT prophylaxis venous Doppler studies negative she would remain on Lovenox for 3 months from day of surgery.  Pain managed with use of Neurontin 300 mg 3 times daily continued Duragesic patch and Lidoderm patch as well as Advil.  Ibuprofen changed to as needed DC'd on 917 due to EGD findings and discussion with hospitalist.  She was on oxycodone as needed.  She did have some right trochanteric bursitis improved with the use of Robaxin and her Lidoderm patch.  Acute blood loss anemia stable latest hemoglobin 8.2.  She was followed by gastroenterology pain medications were adjusted due to bouts of constipation which was felt to be related to neurogenic bowel and opioid induced constipation.  She was on Linzess.  KUB did show initially small ileus diet changed to clear liquids follow-up KUB much improved diet advanced.  She was followed by gastroenterology for some epigastric discomfort there was question new onset of ascites which was followed by interventional radiology did not feel enough that demanded to be tapped.  Underwent EGD 05/29/2019 showing esophageal Candida and multiple gastric ulcers she was maintained on Carafate as well as twice daily Protonix x8 weeks then daily.   Bouts of urinary retention and acute lower UTI Keflex started 9/13 changed to [place] 9/17 completed 06/02/2019 she denied any increasing dysuria.  She did remain on low-dose Flomax to help empty her bladder.  Patient with ongoing intermittent bouts of abdominal discomfort of follow-up CT of the abdomen and pelvis with contrast completed 06/03/2019 showing 1.6 cm hypodense mass within the caudate lobe of the liver highly suspicious for new liver metastasis.  Noted pathologic compression fracture of L2 vertebrae as previously described.  CT findings were provided to oncology services would be addressed as outpatient.  Patient did have some bouts of tachycardia EKG unremarkable felt to be  related to deconditioning she was placed on low-dose beta-blocker.   Blood pressures were monitored on TID basis  and monitored  She did have intermittent bouts of incontinence of bowel  Marlana Salvage has made gains during rehab stay and is attending therapies  He/She will continue to receive follow up therapies   after discharge  Rehab course: During patient's stay in rehab weekly team conferences were held to monitor patient's progress, set goals and discuss barriers to discharge. At admission, patient required minimal assist ambulate 10 feet rolling walker, moderate assist sit to stand, minimal assist sit to side-lying and moderate assist side-lying to sitting.  Minimal assist upper body bathing max is lower body bathing minimal assist upper body dressing max assist lower body dressing minimal assist toilet transfers  Physical exam.  Blood pressure 120/70 pulse 71 temperature 98.3 respirations 16 oxygen saturation 99% room air Constitutional alert and oriented Pupils round and reactive to light no discharge without nystagmus Neck.  Supple nontender no tracheal deviation no thyromegaly Cardiovascular normal rate and rhythm exam reveals no friction rub or murmur heard Respiratory effort normal no respiratory distress without wheezes GI.  Soft mild distention positive bowel sounds without rebound Musculoskeletal normal range of motion Neurological.  Alert and oriented upper extremities 5 out of 5 right lower extremity 4- out of 5 to 4-5 proximal to distal.  Left lower extremity hip flexors, knee extension trace to 0 out of 5, ankle dorsiflexion trace out of 5, ankle plantarflexion 1+ out of 5.  Back incision clean and dry  /She  has had improvement in activity tolerance, balance, postural control as well as ability to compensate for deficits. Marlana Salvage has had improvement in functional use RUE/LUE  and RLE/LLE as well as improvement in awareness.  Working with energy conservation techniques.  She did need  some encouragement at times to participate.  Supine to sit via logroll with moderate assist.  Patient dependent to don LSO, shoes with a left AFO.  Manual wheelchair propulsion 100 feet with the use of bilateral upper extremities and contact-guard.  Patient did fatigue easily.  Sliding board transfers wheelchair to and from level mat table with moderate assist with focus on body positioning during transfers.  Total assist for lower extremity clothing management minimal assist to roll.  Logrolling to sit on edge of bed with moderate assist and OT placing sliding board with total assist for minimal assist transfers bed.  Full family teaching completed plan discharge to home       Disposition: Discharge disposition: 01-Home or Self Care     Discharge to home   Diet: Regular  Special Instructions: Follow-up oncology services for planned chemotherapy  Continue subcutaneous Lovenox 40 mg daily until 08/09/2019 and stop  Medications at discharge. 1.  Tylenol as needed 2.  Os-Cal 1 tab twice daily 3.  Lovenox 40 mg daily until 08/09/2019 and stop 4.  Duragesic patch 75 mcg change every 72 hours 5.  Neurontin 300 mg p.o. 3 times daily 6.  Lidoderm patch change as directed 7.  Linzess 145 mcg p.o. daily 8.  Robaxin 500 mg p.o. every 8 hours as needed muscle spasms 9.  Lopressor 12.5 mg p.o. twice daily 10.  Oxycodone 10 mg every 4 hours as needed severe pain 11.  Zofran 4 mg every 6 hours as needed nausea 12.  Protonix 40 mg p.o. twice daily 13.  Phosphorus 500 mg p.o. daily 14.  Carafate 1 g p.o. 3 times daily 15.  Flomax 0.4 mg daily  Discharge Instructions    Ambulatory referral to Physical Medicine Rehab   Complete by: As directed  Moderate complexity follow-up 1 to 2 weeks cauda equina syndrome      Follow-up Information    Lovorn, Jinny Blossom, MD Follow up.   Specialty: Physical Medicine and Rehabilitation Why: As directed Contact information: [address]  [place] [address] [phone]        Consuella Lose, MD Follow up.   Specialty: Neurosurgery Why: Call for appointment Contact information: [address] [place] [address] [phone]        Curt Bears, MD Follow up.   Specialty: Oncology Why: Call for appointment Contact information: [place] [address] [phone]        Ronnette Juniper, MD Follow up.   Specialty: Gastroenterology Why: call for appointment as needed Contact information: [place] [address] [phone]        Caren Macadam, MD. Call.   Specialty: Family Medicine Why: as needed for any follow up care.  You have many other appointments already scheduled, so Sonia Baller did not schedule PCP in order to give you flexibity to get to other appointments. Contact information: [place] [address] [phone]           Signed: Lavon Paganini [place] 06/07/2019, 5:33 AM

## 2019-06-05 NOTE — Progress Notes (Signed)
Occupational Therapy Session Note  Patient Details  Name: Rebecca Lucas MRN: [id number] Date of Birth: [birth year]  Today's Date: 06/05/2019 OT Individual Time: 3154-0086 OT Individual Time Calculation (min): 44 min    Short Term Goals: Week 3:  OT Short Term Goal 1 (Week 3): STG=LTG Due to LOS  Skilled Therapeutic Interventions/Progress Updates:    Upon entering the room, pt supine in bed with RN present and giving medications for pain. Pt placed into long sitting with bed functions. Pt given reacher to attempt to pull pants over B feet and up LEs but declined secondary to pain. Pt also declined attempt to circle sit. Total A for L clothing management with min A to roll L <> R. OT donned B TED hose, AFO, and B shoes from bed level with total A. Log roll into sit on EOB with mod A and OT placing slide board with total A for min A transfer bed >recliner chair. OT positioned pt for comfort. Call bell and all needed items within reach upon exiting the room.   Therapy Documentation Precautions:  Precautions Precautions: Fall, Back Precaution Comments: reviewed spinal precautions Required Braces or Orthoses: Spinal Brace Spinal Brace: Lumbar corset, Applied in sitting position Restrictions Weight Bearing Restrictions: No General:   Vital Signs: Therapy Vitals Pulse Rate: (!) 128 Oxygen Therapy SpO2: 98 % O2 Device: Room Air Pain: Pain Assessment Pain Scale: 0-10 Pain Score: 6  Faces Pain Scale: No hurt Pain Type: Surgical pain Pain Location: Back Pain Orientation: Lower Pain Descriptors / Indicators: Aching Pain Frequency: Constant Pain Onset: Gradual Patients Stated Pain Goal: 3 Pain Intervention(s): Medication (See eMAR);Emotional support Multiple Pain Sites: No ADL:   Vision   Perception    Praxis   Exercises:   Other Treatments:     Therapy/Group: Individual Therapy  [name] 06/05/2019, 10:21 AM

## 2019-06-05 NOTE — Progress Notes (Signed)
Occupational Therapy Session Note  Patient Details  Name: Rebecca Lucas MRN: [id number] Date of Birth: Aug 17, 1956  Today's Date: 06/05/2019 OT Individual Time: 1000-1040 OT Individual Time Calculation (min): 40 min    Short Term Goals: Week 3:  OT Short Term Goal 1 (Week 3): STG=LTG Due to LOS  Skilled Therapeutic Interventions/Progress Updates:    Pt seen for OT session focusing on functional mobility and UE strengthening. Pt sitting up in recliner upon arrival, agreeable to tx session. Voiced pain as "tolerable" and reports being up to date on all medications. Repositioned for comfort throughout session. Completed mod-max A sliding board transfer from recliner to w/c. Discussed home set-up and chairs that would be accessible to transfer via sliding board with emphasis on firmer surfaces and similiar surface height to w/c. Also discussed toileting task and toilet transfers. Pt now having to be I/O cathed and incontinent of bowel. As a result, pt declining need to practice toilet transfers any longer.  Pt taken to therapy gym total A in w/c. Completed UE strengthening exercises using level II (oranage) theraband). Completed shoulder abduction, diagonal up to R/L, bicep curl and upright row. VCs and demonstration for proper form and technique. Completed x10 of each exercise.  Pt returned to room and completed mod A slide board transfer back to bed. Mod A to transition to side-ying. Positioned with pillows for comfort and pt left with all needs in reach and bed alarm on. Education throughout session regarding modified ADLs, transfer techniques, DME, family training and d/c planning  Therapy Documentation Precautions:  Precautions Precautions: Fall, Back Precaution Comments: reviewed spinal precautions Required Braces or Orthoses: Spinal Brace Spinal Brace: Lumbar corset, Applied in sitting position Restrictions Weight Bearing Restrictions: No   Therapy/Group: Individual  Therapy  Sonnet Rizor L 06/05/2019, 6:57 AM

## 2019-06-05 NOTE — Progress Notes (Signed)
Physical Therapy Session Note  Patient Details  Name: Rebecca Lucas MRN: [id number] Date of Birth: May 28, 1956  Today's Date: 06/05/2019 PT Individual Time: 1315-1350 PT Individual Time Calculation (min): 35 min   Short Term Goals: Week 3:  PT Short Term Goal 1 (Week 3): Pt will complete transfers with mod A consistently PT Short Term Goal 2 (Week 3): Pt will perform sit to stand with mod A PT Short Term Goal 3 (Week 3): Pt will tolerate sitting up in chair x 1 hour  Skilled Therapeutic Interventions/Progress Updates:    Pt received seated in bed eating lunch. Pt missed 10 min of therapy at beginning of session so she could finish eating lunch. Once therapist returned pt agreeable to participate in session. Pt reports ongoing pain in back, 7/10. Pt declines intervention and reports she has already received pain medication. Supine to sit via log roll with mod A. Pt is dependent to don LSO, shoes, and L AFO while seated EOB. Slide board transfer bed to w/c with min A. Manual w/c propulsion x 100 ft with use of BUE and CGA due to w/c veering to the L. Pt fatigues quickly with w/c mobility. Slide board transfer w/c to/from level mat table with mod A with focus on body positioning during transfer. Slide board transfer back to bed mod A. Sit to supine to R sidelying with mod A. Pt left in sidelying with needs in reach, bed alarm in place at end of session.  Therapy Documentation Precautions:  Precautions Precautions: Fall, Back Precaution Comments: reviewed spinal precautions Required Braces or Orthoses: Spinal Brace Spinal Brace: Lumbar corset, Applied in sitting position Restrictions Weight Bearing Restrictions: No General: PT Amount of Missed Time (min): 10 Minutes PT Missed Treatment Reason: Unavailable (Comment)(pt eating lunch)    Therapy/Group: Individual Therapy   Excell Seltzer, PT, DPT  06/05/2019, 3:53 PM

## 2019-06-05 NOTE — Progress Notes (Signed)
Pt remains in yellow MEWS due to increased pulse rate. Based on note from  Dr. Dagoberto Ligas this afternoon and previous RN note this is not an acute change and MDs are aware. Pt is asymptomatic. Will continue to monitor for any changes.   Vital Signs MEWS/VS Documentation      06/04/2019 1544 06/04/2019 1956 06/04/2019 2000 06/04/2019 2011   MEWS Score:  2  2  -  0   MEWS Score Color:  Yellow  Yellow  -  Green   Resp:  18  17  -  -   Pulse:  (!) 128  (!) 128  -  -   BP:  (!) 109/54  125/72  -  -   Temp:  99.4 F (37.4 C)  98.3 F (36.8 C)  -  -   O2 Device:  Room Air  Room Air  -  -   Level of Consciousness:  -  -  Psychologist, occupational R Meria Crilly 06/05/2019,12:52 AM

## 2019-06-05 NOTE — Progress Notes (Signed)
[place] PHYSICAL MEDICINE & REHABILITATION PROGRESS NOTE   Subjective/Complaints:   Pt reports feeling a lot better, pain wise- much more manageable with increase in pain meds- however dozed off during breakfast and didn't eat breakfast on time.  Slept well- denies SOB; denies feeling bad at all. LBM yesterday  Spoke with husband- explained that needs Lovenox for a total of 3 months, will let him know timing of Carafate and PPI at d/c on Friday- he will be here tomorrow for family training tomorrow..     ROS: Abd pain better; back pain better  With increase in pain medsDenies CP, shortness of breath.  Objective:   Ct Abdomen Pelvis W Contrast  Result Date: 06/03/2019 CLINICAL DATA:  Abdominal pain.  Rule out abdominal metastasis. EXAM: CT ABDOMEN AND PELVIS WITH CONTRAST TECHNIQUE: Multidetector CT imaging of the abdomen and pelvis was performed using the standard protocol following bolus administration of intravenous contrast. CONTRAST:  126mL OMNIPAQUE IOHEXOL 300 MG/ML  SOLN COMPARISON:  CT abdomen dated 05/27/2019. FINDINGS: Lower chest: New LEFT pleural effusion with adjacent compressive atelectasis. Small RIGHT pleural effusion. Hepatobiliary: 1.6 cm hypodense mass within the caudate lobe of the liver which is new compared to an earlier CT of 02/19/2019 (series 3, image 18). Gallbladder is contracted. No bile duct dilatation seen. Pancreas: Unremarkable. No pancreatic ductal dilatation or surrounding inflammatory changes. Spleen: Normal in size without focal abnormality. Adrenals/Urinary Tract: Adrenal glands are unremarkable. Kidneys are unremarkable without mass, stone or hydronephrosis. Bladder appears normal. Stomach/Bowel: No dilated large or small bowel loops. No evidence of bowel wall inflammation seen. Stomach is distended with recently ingested materials. Vascular/Lymphatic: Aortic atherosclerosis. No acute appearing vascular abnormality. No enlarged lymph nodes identified.  Reproductive: Uterus and bilateral adnexa are unremarkable. Other: Small amount of ascites. Soft tissue mass at the crus of the RIGHT hemidiaphragm, again measuring 2.8 cm greatest dimension, compatible with metastatic implants/extension. Musculoskeletal: Stable appearance of the pathologic compression fracture of L2. The mass at the crus of the RIGHT hemidiaphragm may be soft tissue extension of this vertebral body metastasis. IMPRESSION: 1. 1.6 cm hypodense mass within the caudate lobe of the liver, highly suspicious for a new liver metastasis. 2. Pathologic compression fracture of L2 vertebral body, as previously described. 3. Soft tissue mass at the crus of the RIGHT hemidiaphragm, measuring 2.8 cm, compatible with metastatic implant and/or soft tissue extension of the L2 vertebral body metastasis. 4. Small amount of ascites. 5. New LEFT pleural effusion.  Trace RIGHT pleural effusion. 6.  Aortic Atherosclerosis (ICD10-I70.0). Electronically Signed   By: Franki Cabot M.D.   On: 06/03/2019 15:18   Recent Labs    06/03/19 0520 06/04/19 0454  WBC 13.7* 14.4*  HGB 8.2* 8.8*  HCT 25.6* 26.4*  PLT 141* 158   Recent Labs    06/03/19 0520 06/04/19 0454  NA 132* 132*  K 4.3 4.1  CL 98 95*  CO2 25 26  GLUCOSE 106* 98  BUN 12 11  CREATININE 0.72 0.55  CALCIUM 8.1* 8.6*    Intake/Output Summary (Last 24 hours) at 06/05/2019 1120 Last data filed at 06/05/2019 0617 Gross per 24 hour  Intake 118 ml  Output 1500 ml  Net -1382 ml     Physical Exam: Vital Signs Blood pressure 122/70, pulse (!) 128, temperature 98.1 F (36.7 C), temperature source Oral, resp. rate 18, height 5\' 7"  (1.702 m), weight 63.9 kg, SpO2 98 %. Constitutional: No distress . Vital signs and labs reviewed. sitting up  in  bed; trying to eat breakfast,  Sleepy, but appropriate, appears comfortable, esp compared to yesterday, NAD HENT: Normocephalic.  Atraumatic. Eyes: EOMI. No discharge. Cardiovascular: tachycardic,  regular rhythm Respiratory: CTA B/L. Denies SOB; sats 98% on RA GI: some mild distension vs protrusion; softer; no TTP; (+)BS Skin: Warm and dry.  Intact. Psych: brighter affect Musc: No edema in extremities. TTP over R trochanteric bursa c/w bursitis Neuro: Alert Motor: Bilateral upper extremities: 5/5 proximal distal Right lower extremity: 3-/5 proximal distal, unchanged, unchanged Left lower extremity: 0/5 proximal to distal, unchanged  Assessment/Plan: 1. 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  Physiatrist is providing close team supervision and 24 hour management of active medical problems listed below.  Physiatrist and rehab team continue to assess barriers to discharge/monitor patient progress toward functional and medical goals  Care Tool:  Bathing  Bathing activity did not occur: Refused Body parts bathed by patient: Right upper leg, Left upper leg, Right arm, Left arm, Chest, Abdomen, Face   Body parts bathed by helper: Right lower leg, Left lower leg, Front perineal area, Buttocks     Bathing assist Assist Level: Moderate Assistance - Patient 50 - 74%     Upper Body Dressing/Undressing Upper body dressing   What is the patient wearing?: Pull over shirt Orthosis activity level: Performed by patient  Upper body assist Assist Level: Minimal Assistance - Patient > 75%    Lower Body Dressing/Undressing Lower body dressing      What is the patient wearing?: Pants     Lower body assist Assist for lower body dressing: Total Assistance - Patient < 25%     Toileting Toileting Toileting Activity did not occur Landscape architect and hygiene only): Refused  Toileting assist Assist for toileting: Total Assistance - Patient < 25%      Transfers Chair/bed transfer  Transfers assist     Chair/bed transfer assist level: Moderate Assistance - Patient 50 - 74%     Locomotion Ambulation   Ambulation assist      Assist level: Contact Guard/Touching assist Assistive device: Walker-rolling Max distance: 45'   Walk 10 feet activity   Assist     Assist level: Contact Guard/Touching assist Assistive device: Walker-rolling   Walk 50 feet activity   Assist Walk 50 feet with 2 turns activity did not occur: Safety/medical concerns         Walk 150 feet activity   Assist Walk 150 feet activity did not occur: Safety/medical concerns         Walk 10 feet on uneven surface  activity   Assist Walk 10 feet on uneven surfaces activity did not occur: Safety/medical concerns         Wheelchair     Assist Will patient use wheelchair at discharge?: Yes Type of Wheelchair: Manual Wheelchair activity did not occur: Safety/medical concerns  Wheelchair assist level: Supervision/Verbal cueing Max wheelchair distance: 150    Wheelchair 50 feet with 2 turns activity    Assist    Wheelchair 50 feet with 2 turns activity did not occur: Safety/medical concerns   Assist Level: Supervision/Verbal cueing   Wheelchair 150 feet activity     Assist    Wheelchair 150 feet activity did not occur: Safety/medical concerns   Assist Level: Supervision/Verbal cueing   Medical Problem List and Plan: 1.Decreased functional  mobilitysecondary to 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   Continue CIR -Back brace when out of bed. -Plan is to follow-up outpatient Dr. Earlie Server in  regards to chemotherapy  Chemotherapy was to begin on 9/17, however now on hold  9/23- will get MRI of cervical and lumbar spine due to concerns for additional mets because according to therapy, pt weaker esp compared to 1-2 weeks ago- also more Bowel/bladders issues (had incontinent bowel episode- didn't realize it x1)  2. Antithrombotics: -DVT/anticoagulation:SCDs. Lovenox  LE venous dopplers negative for DVT  9/23- needs for a total of 3 months -antiplatelet therapy: N/A 3. Pain Management:  Neurontin 300 mg 3 times daily  Continue patch 75 mcg, Advil 400 mg  Ibuprofen changed to PRN on 9/5, [place] on 9/17 due to EGD findings and discussion with hospitalist  9/16- oxy is q4 hours prn   Lidoderm patch added on 9/17 to back with now mild improvement  Robaxin 500 3 times daily as needed started on 9/20  9/21- R trochanteric bursitis- somewhat improved with robaxin and lidocaine patch  9/22- increased oxycodone to 10 mg q4 hours prn  9/23- helping a lot- a little sleepy 4. Mood:motivational support -antipsychotic agents: N/A 5. Neuropsych: This patientiscapable of making decisions on herown behalf. 6. Skin/Wound Care:Routine skin checks 7. Fluids/Electrolytes/Nutrition:Routine in and outs  P.o. intake improving, IVF DC'd on 9/20 8. Acute blood loss anemia.   Hemoglobin 8.2 on 9/20, labs ordered for tomorrow  Hemoccult ordered  Continue to await further GI recs  -con't carafate/PPI BID, etc and will also arrange f/u with GI after d/c.  Continue to monitor 9. Leukocytosis . Continue to monitor  WBCs 12.6 on 9/20  9/21- WBC up to 13.7- IM ordering CT of abd/pelvis  9/22- WBC up to 14.4- pulse 123- sats 95-97%- she feels well- no complaints except back pain- will monitor closely.  9/23- labs in AM- feeling great- afebrile   Afebrile  Continue to monitor 10. Urinary retention/acute lower UTI:  Keflex, started on 9/13, changed to [place] on 9/17,  [place] on 9/20 11. Constipation in setting of Neurogenic bowel and opioid-induced constipation. Adjust bowel program as needed  Bowel program with dig stim at night/evening.   Linzess changed to [place] on 9/17, [place] by hospitalist-may need to resume  Miralax DC'd  Appears to be improving  KUB personally reviewed, showing improvement since suggestion of ileus on 9/17  Improving on 9/20.  9/22- with increase in oxycodone, will restart Linzess.   9/23- encouraged husband to monitor stools closely- easy to back up in SCI pts. 12.  Steroid-induced hyperglycemia  Continue to monitor  Elevated on 9/20 13. Severe abd pain  CT showed ascites and gallbladder sludge- GI consulted- ordered U/S guided paracentesis- GI ordered associated labs    EGD performed by GI, appreciate recs  PPI twice daily x8 weeks then daily as long as patient requires steroids  Await cytology results  Hospitalist following, appreciate recs, notes reviewed  Diet changed back to regular diet after being on liquids on 9/17  Improved 14.  Sleep disturbance  Trazodone 50 nightly as needed  15.  Hyponatremia  Sodium 133 on 9/19, labs ordered for tomorrow  Continue to monitor 16.  Transaminitis  LFTs elevated on 9/18, labs ordered for tomorrow  9/21- Slightly elevated,  but improving- ALT 48; AST 30  Continue to monitor 17.  Oral candidiasis  Noted on EGD  Diflucan 100 mg daily x7 days per GI, started on 9/16 18.  Tachycardia  Likely secondary to pain  ECG reviewed, limited  Low-dose metoprolol started on 9/18  First troponin borderline negative, repeat negative 19.  Hypokalemia  Potassium 4.5 on 9/20, labs ordered for tomorrow  Supplement initiated, [place] on 9/20  Magnesium 1.7 on 9/20 20.  Thrombocytopenia  Platelets 126 on 9/20  9/23- plts 158k on lovenox 21.  Hypophosphatemia  Noted to have critical value, supplement initiated  Repeat labs pending, also will ordered for tomorrow  9/21- 2.8- which is in normal  levels  22. Dispo  9/21- will discuss with pt/husband/team  9/22- d/w Dr Earlie Server- will send home Friday- pt/SW aware  Labs TH  Spent more than 40 minutes on pt today- d/w Dr Claudina Lick pt care as above, d/w radiology correct imaging, and team conference which concerned me that pt was so weak and needed additional imaging.  LOS: 22 days A FACE TO FACE EVALUATION WAS PERFORMED  Nylene Inlow 06/05/2019, 11:20 AM

## 2019-06-06 ENCOUNTER — Encounter (HOSPITAL_COMMUNITY): Payer: 59 | Admitting: Occupational Therapy

## 2019-06-06 ENCOUNTER — Ambulatory Visit: Payer: 59

## 2019-06-06 ENCOUNTER — Ambulatory Visit: Payer: 59 | Admitting: Physician Assistant

## 2019-06-06 ENCOUNTER — Inpatient Hospital Stay: Payer: 59

## 2019-06-06 ENCOUNTER — Other Ambulatory Visit: Payer: 59

## 2019-06-06 ENCOUNTER — Ambulatory Visit: Payer: 59 | Admitting: Internal Medicine

## 2019-06-06 ENCOUNTER — Ambulatory Visit (HOSPITAL_COMMUNITY): Payer: 59 | Admitting: Physical Therapy

## 2019-06-06 ENCOUNTER — Telehealth: Payer: Self-pay | Admitting: Internal Medicine

## 2019-06-06 LAB — CBC WITH DIFFERENTIAL/PLATELET
Abs Immature Granulocytes: 0 10*3/uL (ref 0.00–0.07)
Basophils Absolute: 0 10*3/uL (ref 0.0–0.1)
Basophils Relative: 0 %
Eosinophils Absolute: 0.2 10*3/uL (ref 0.0–0.5)
Eosinophils Relative: 1 %
HCT: 25.2 % — ABNORMAL LOW (ref 36.0–46.0)
Hemoglobin: 8.2 g/dL — ABNORMAL LOW (ref 12.0–15.0)
Lymphocytes Relative: 2 %
Lymphs Abs: 0.3 10*3/uL — ABNORMAL LOW (ref 0.7–4.0)
MCH: 32.4 pg (ref 26.0–34.0)
MCHC: 32.5 g/dL (ref 30.0–36.0)
MCV: 99.6 fL (ref 80.0–100.0)
Monocytes Absolute: 0 10*3/uL — ABNORMAL LOW (ref 0.1–1.0)
Monocytes Relative: 0 %
Neutro Abs: 15.9 10*3/uL — ABNORMAL HIGH (ref 1.7–7.7)
Neutrophils Relative %: 97 %
Platelets: 156 10*3/uL (ref 150–400)
RBC: 2.53 MIL/uL — ABNORMAL LOW (ref 3.87–5.11)
RDW: 16.6 % — ABNORMAL HIGH (ref 11.5–15.5)
WBC: 16.4 10*3/uL — ABNORMAL HIGH (ref 4.0–10.5)
nRBC: 0 /100 WBC
nRBC: 0.1 % (ref 0.0–0.2)

## 2019-06-06 LAB — PHOSPHORUS: Phosphorus: 2.8 mg/dL (ref 2.5–4.6)

## 2019-06-06 LAB — COMPREHENSIVE METABOLIC PANEL
ALT: 40 U/L (ref 0–44)
AST: 31 U/L (ref 15–41)
Albumin: 1.9 g/dL — ABNORMAL LOW (ref 3.5–5.0)
Alkaline Phosphatase: 125 U/L (ref 38–126)
Anion gap: 12 (ref 5–15)
BUN: 24 mg/dL — ABNORMAL HIGH (ref 8–23)
CO2: 23 mmol/L (ref 22–32)
Calcium: 8.1 mg/dL — ABNORMAL LOW (ref 8.9–10.3)
Chloride: 98 mmol/L (ref 98–111)
Creatinine, Ser: 0.54 mg/dL (ref 0.44–1.00)
GFR calc Af Amer: >60 mL/min (ref >60)
GFR calc non Af Amer: >60 mL/min (ref >60)
Glucose, Bld: 108 mg/dL — ABNORMAL HIGH (ref 70–99)
Potassium: 3.4 mmol/L — ABNORMAL LOW (ref 3.5–5.1)
Sodium: 133 mmol/L — ABNORMAL LOW (ref 135–145)
Total Bilirubin: 0.3 mg/dL (ref 0.3–1.2)
Total Protein: 5.2 g/dL — ABNORMAL LOW (ref 6.5–8.1)

## 2019-06-06 LAB — OCCULT BLOOD X 1 CARD TO LAB, STOOL: Fecal Occult Bld: POSITIVE — AB

## 2019-06-06 MED ORDER — POTASSIUM CHLORIDE CRYS ER 20 MEQ PO TBCR
40.0000 meq | EXTENDED_RELEASE_TABLET | Freq: Three times a day (TID) | ORAL | Status: AC
  Administered 2019-06-06 (×2): 40 meq via ORAL
  Filled 2019-06-06 (×2): qty 2

## 2019-06-06 MED ORDER — HEPARIN SOD (PORK) LOCK FLUSH 100 UNIT/ML IV SOLN
500.0000 [IU] | INTRAVENOUS | Status: AC | PRN
  Administered 2019-06-06: 08:00:00 500 [IU]
  Filled 2019-06-06: qty 5

## 2019-06-06 NOTE — Progress Notes (Signed)
Occupational Therapy Session Note  Patient Details  Name: Rebecca Lucas MRN: [id number] Date of Birth: 1956/09/03  Today's Date: 06/06/2019 OT Individual Time: 1330-1415 OT Individual Time Calculation (min): 45 min    Short Term Goals: Week 3:  OT Short Term Goal 1 (Week 3): STG=LTG Due to LOS  Skilled Therapeutic Interventions/Progress Updates:    Pt seen for OT family education session. Pt in side-lying upon arrival, RN and family present. Pt actively having bowel movement in side-lying. Was unaware she was voiding though was able to push down to assist in passing BM. Total A for clean-up. Education provided to caregivers regarding bed mobility techniques to assist with bed level ADLs. She rolled with min A for management of LEs, pt able to direct care for bed mobility.  Extensive education provided throughout session regarding modified ADLs, bed level ADLs, energy conservation, importance of building OOB tolerance, pt's CLOF, mobility techniques, w/c parts management, continuum of care, and d/c planning. Pt left in side-lying at end of session, hand off to PT.   Therapy Documentation Precautions:  Precautions Precautions: Fall, Back Precaution Comments: reviewed spinal precautions Required Braces or Orthoses: Spinal Brace Spinal Brace: Lumbar corset, Applied in sitting position Restrictions Weight Bearing Restrictions: No   Therapy/Group: Individual Therapy  Kegan Shepardson L 06/06/2019, 7:02 AM

## 2019-06-06 NOTE — Progress Notes (Signed)
Patient due to be bladder scanned @6am . Patient refused due to pain level. Pain medications given and will report this to the nurse coming on.

## 2019-06-06 NOTE — Progress Notes (Signed)
[place] PHYSICAL MEDICINE & REHABILITATION PROGRESS NOTE   Subjective/Complaints:   Pt reports no issues today- family coming in for training today- Discussed specifically with nursing that pt's family need to be trained on lovenox admin, Caths and bowel program.   No fever; having a good day. Denies any Sx's of feeling poorly in AM and again in early afternoon when speaking with PA, Linna Hoff.    ROS: Abd pain better; back pain better  With increase in pain meds; Denies CP, shortness of breath, leg swelling.  Objective:   Mr Cervical Spine W Wo Contrast  Result Date: 06/06/2019 CLINICAL DATA:  Follow-up examination for metastatic spine tumor. EXAM: MRI CERVICAL SPINE WITHOUT AND WITH CONTRAST TECHNIQUE: Multiplanar and multiecho pulse sequences of the cervical spine, to include the craniocervical junction and cervicothoracic junction, were obtained without and with intravenous contrast. CONTRAST:  6.21mL GADAVIST GADOBUTROL 1 MMOL/ML IV SOLN COMPARISON:  Prior MRI from 04/06/2019. FINDINGS: Alignment: Trace 2 mm anterolisthesis of C2 on C3, stable. Straightening with slight reversal of the normal underlying cervical lordosis. Vertebrae: Enhancing osseous metastatic lesion involving the posterior elements of C2 and C3 is relatively unchanged from previous exam. No interval increase in size or encroachment upon the adjacent spinal canal. No new osseous lesions. Underlying bone marrow signal intensity remains within normal limits. Cord: Signal intensity within the cervical spinal cord is normal. Normal cord caliber and morphology. No intramedullary or epidural enhancement. Posterior Fossa, vertebral arteries, paraspinal tissues: Visualized brain and posterior fossa of demonstrates no significant finding. Negative craniocervical junction. Paraspinous and prevertebral soft tissues within normal limits. Normal intravascular flow voids noted within the vertebral arteries bilaterally. Intraluminal fluid  noted within the partially visualized mid esophagus. Disc levels: C2-C3: Negative interspace.  Mild facet hypertrophy.  No stenosis. C3-C4: Small left paracentral disc protrusion, unchanged. No canal or foraminal stenosis. C4-C5: Mild disc bulging with uncovertebral hypertrophy. No significant canal or foraminal stenosis. C5-C6: Chronic diffuse degenerative disc osteophyte with intervertebral disc space narrowing. Mild facet hypertrophy. Flattening of the ventral thecal sac without significant spinal stenosis. Unchanged mild left foraminal stenosis. Right neural foramen remains patent. C6-C7:  Small central disc protrusion, unchanged.  No stenosis. C7-T1:  Unremarkable. Visualized upper thoracic spine demonstrates no significant finding. IMPRESSION: 1. No significant interval change in osseous metastatic disease involving the posterior elements of C2 and C3. No encroachment upon the adjacent spinal canal. 2. No evidence for new or progressive metastatic disease within the cervical spine. 3. Mild multilevel cervical spondylosis as above, stable. Electronically Signed   By: Jeannine Boga M.D.   On: 06/06/2019 00:02   Recent Labs    06/04/19 0454 06/06/19 0419  WBC 14.4* 16.4*  HGB 8.8* 8.2*  HCT 26.4* 25.2*  PLT 158 156   Recent Labs    06/04/19 0454 06/06/19 0419  NA 132* 133*  K 4.1 3.4*  CL 95* 98  CO2 26 23  GLUCOSE 98 108*  BUN 11 24*  CREATININE 0.55 0.54  CALCIUM 8.6* 8.1*    Intake/Output Summary (Last 24 hours) at 06/06/2019 1259 Last data filed at 06/06/2019 1941 Gross per 24 hour  Intake 360 ml  Output 1100 ml  Net -740 ml     Physical Exam: Vital Signs Blood pressure 125/67, pulse (!) 124, temperature 99.2 F (37.3 C), temperature source Oral, resp. rate 18, height 5\' 7"  (1.702 m), weight 63.8 kg, SpO2 93 %. Constitutional: No distress . Vital signs and labs reviewed. Lying in bed; eyes close;d getting  cathed by nursing; wasn't able to void this AM prior to cath,  NAD HENT: Normocephalic.  Atraumatic. Eyes: EOMI. No discharge. Cardiovascular: tachycardic, regular rhythm Respiratory: CTA B/L. Denies SOB; sats 98% on RA GI: some mild abdominal protrusion; soft; no TTP; (+)BS Skin: Warm and dry.  Intact. Psych: brighter affect Musc: No edema in extremities. TTP over R trochanteric bursa c/w bursitis Neuro: Alert Motor: Bilateral upper extremities: 5/5 proximal distal Right lower extremity: 3-/5 proximal distal, unchanged, unchanged Left lower extremity: 0/5 proximal to distal, unchanged  Assessment/Plan: 1. 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  Physiatrist is providing close team supervision and 24 hour management of active medical problems listed below.  Physiatrist and rehab team continue to assess barriers to discharge/monitor patient progress toward functional and medical goals  Care Tool:  Bathing  Bathing activity did not occur: Refused Body parts bathed by patient: Right upper leg, Left upper leg, Right arm, Left arm, Chest, Abdomen, Face   Body parts bathed by helper: Right lower leg, Left lower leg, Front perineal area, Buttocks     Bathing assist Assist Level: Moderate Assistance - Patient 50 - 74%     Upper Body Dressing/Undressing Upper body dressing   What is the patient wearing?: Pull over shirt Orthosis activity level: Performed by patient  Upper body assist Assist Level: Minimal Assistance - Patient > 75%    Lower Body Dressing/Undressing Lower body dressing      What is the patient wearing?: Pants     Lower body assist Assist for lower body dressing: Total Assistance - Patient < 25%     Toileting Toileting Toileting Activity did not occur Landscape architect and hygiene only): Refused   Toileting assist Assist for toileting: Total Assistance - Patient < 25%     Transfers Chair/bed transfer  Transfers assist     Chair/bed transfer assist level: Moderate Assistance - Patient 50 - 74%     Locomotion Ambulation   Ambulation assist      Assist level: Contact Guard/Touching assist Assistive device: Walker-rolling Max distance: 45'   Walk 10 feet activity   Assist     Assist level: Contact Guard/Touching assist Assistive device: Walker-rolling   Walk 50 feet activity   Assist Walk 50 feet with 2 turns activity did not occur: Safety/medical concerns         Walk 150 feet activity   Assist Walk 150 feet activity did not occur: Safety/medical concerns         Walk 10 feet on uneven surface  activity   Assist Walk 10 feet on uneven surfaces activity did not occur: Safety/medical concerns         Wheelchair     Assist Will patient use wheelchair at discharge?: Yes Type of Wheelchair: Manual Wheelchair activity did not occur: Safety/medical concerns  Wheelchair assist level: Contact Guard/Touching assist Max wheelchair distance: 100'    Wheelchair 50 feet with 2 turns activity    Assist    Wheelchair 50 feet with 2 turns activity did not occur: Safety/medical concerns   Assist Level: Contact Guard/Touching assist   Wheelchair 150 feet activity     Assist    Wheelchair 150 feet activity did not occur: Safety/medical concerns   Assist Level: Supervision/Verbal cueing   Medical Problem List and Plan: 1.Decreased functional mobilitysecondary to  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   Continue  CIR -Back brace when out of bed. -Plan is to follow-up outpatient Dr. Earlie Server in regards to chemotherapy  Chemotherapy was to begin on 9/17, however now on hold  9/23- will get MRI of cervical and lumbar spine due to concerns for additional mets because according to therapy, pt weaker esp compared to 1-2 weeks ago- also more Bowel/bladders issues (had incontinent bowel episode- didn't realize it x1)   9/24- family training today. 2. Antithrombotics: -DVT/anticoagulation:SCDs. Lovenox  LE venous dopplers negative for DVT  9/23- needs for a total of 3 months -antiplatelet therapy: N/A 3. Pain Management:  Neurontin 300 mg 3 times daily  Continue patch 75 mcg, Advil 400 mg  Ibuprofen changed to PRN on 9/5, [place] on 9/17 due to EGD findings and discussion with hospitalist  9/16- oxy is q4 hours prn   Lidoderm patch added on 9/17 to back with now mild improvement  Robaxin 500 3 times daily as needed started on 9/20  9/21- R trochanteric bursitis- somewhat improved with robaxin and lidocaine patch  9/22- increased oxycodone to 10 mg q4 hours prn  9/23- helping a lot- a little sleepy 4. Mood:motivational support -antipsychotic agents: N/A 5. Neuropsych: This patientiscapable of making decisions on herown behalf. 6. Skin/Wound Care:Routine skin checks 7. Fluids/Electrolytes/Nutrition:Routine in and outs  P.o. intake improving, IVF DC'd on 9/20 8. Acute blood loss anemia.   Hemoglobin 8.2 on 9/20, labs ordered for tomorrow  Hemoccult ordered  Continue to await further GI recs  -con't carafate/PPI BID, etc and will also arrange f/u with GI after d/c.  Continue to monitor 9. Leukocytosis . Continue to monitor  WBCs 12.6 on 9/20  9/21- WBC up to 13.7- IM ordering CT of abd/pelvis  9/22- WBC up to 14.4- pulse 123- sats 95-97%- she feels well- no complaints except back pain- will monitor closely.  9/23- labs in AM- feeling great-  afebrile   9/24- still afebrile; feeling great- per d/w ID, don't treat pt, not labs- due to concern will double check in AM prior to d/c  Afebrile  Continue to monitor 10. Urinary retention/acute lower UTI:  Keflex, started on 9/13, changed to [place] on 9/17, [place] on 9/20 11. Constipation in setting of Neurogenic bowel and opioid-induced constipation. Adjust bowel program as needed  Bowel program with dig stim at night/evening.   Linzess changed to [place] on 9/17, [place] by hospitalist-may need to resume  Miralax DC'd  Appears to be improving  KUB personally reviewed, showing improvement since suggestion of ileus on 9/17  Improving on 9/20.  9/22- with increase in oxycodone, will restart Linzess.   9/23- encouraged husband to monitor stools closely- easy to back up in SCI pts. 12.  Steroid-induced hyperglycemia  Continue to monitor  Elevated on 9/20 13. Severe abd pain  CT showed ascites and gallbladder sludge- GI consulted- ordered U/S guided paracentesis- GI ordered associated labs    EGD performed by GI, appreciate recs  PPI twice daily x8 weeks then daily as long as patient requires steroids  Await cytology results  Hospitalist following, appreciate recs, notes reviewed  Diet changed back to regular diet after being on liquids on 9/17  Improved 14.  Sleep disturbance  Trazodone 50 nightly as needed  15.  Hyponatremia  Sodium 133 on 9/19, labs ordered for tomorrow  Continue to monitor 16.  Transaminitis  LFTs elevated on 9/18, labs ordered for tomorrow  9/21- Slightly elevated, but improving- ALT 48; AST 30  Continue to monitor 17.  Oral candidiasis  Noted on EGD  Diflucan 100 mg daily x7 days per GI, started on 9/16 18.  Tachycardia  Likely secondary to pain  ECG reviewed, limited  Low-dose metoprolol started on 9/18  First troponin borderline negative, repeat negative 19.  Hypokalemia  Potassium 4.5 on 9/20, labs ordered for tomorrow  Supplement initiated, [place]  on 9/20  Magnesium 1.7 on 9/20  9/24- Will replete K+ 3.4- 40 mEq x2 20.  Thrombocytopenia  Platelets 126 on 9/20  9/23- plts 158k on lovenox 21.  Hypophosphatemia  Noted to have critical value, supplement initiated  Repeat labs pending, also will ordered for tomorrow  9/21- 2.8- which is in normal levels  22. Dispo  9/21- will discuss with pt/husband/team  9/22- d/w Dr Earlie Server- will send home Friday- pt/SW aware  Labs [facility]  9/24- will check labs in AM due to rising leukocytosis    LOS: 23 days A FACE TO FACE EVALUATION WAS PERFORMED  Quintez Maselli 06/06/2019, 12:59 PM

## 2019-06-06 NOTE — Progress Notes (Signed)
Pt education done with husband, Dr. [name] and pt's sister [name] on administration of Lovenox and in and out catheter procedure using handout and demonstration. Husband demonstrated proper administration lovenox. husband performed in and out catheterization and would benefit from another opportunity to practice. All questions answered. Continue to reinforce education. Gerald Stabs, RN

## 2019-06-06 NOTE — Plan of Care (Signed)
  Problem: RH Balance Goal: LTG: Patient will maintain dynamic sitting balance (OT) Description: LTG:  Patient will maintain dynamic sitting balance with assistance during activities of daily living (OT) Outcome: Completed/Met   Problem: RH Grooming Goal: LTG Patient will perform grooming w/assist,cues/equip (OT) Description: LTG: Patient will perform grooming with assist, with/without cues using equipment (OT) Outcome: Completed/Met   Problem: RH Bathing Goal: LTG Patient will bathe all body parts with assist levels (OT) Description: LTG: Patient will bathe all body parts with assist levels (OT) Outcome: Completed/Met   Problem: RH Dressing Goal: LTG Patient will perform upper body dressing (OT) Description: LTG Patient will perform upper body dressing with assist, with/without cues (OT). Outcome: Completed/Met Goal: LTG Patient will perform lower body dressing w/assist (OT) Description: LTG: Patient will perform lower body dressing with assist, with/without cues in positioning using equipment (OT) Outcome: Not Met (add Reason) Note: Pt requires total A for LB dressing at bed level. Varun Jourdan, OTR/L   Problem: RH Toileting Goal: LTG Patient will perform toileting task (3/3 steps) with assistance level (OT) Description: LTG: Patient will perform toileting task (3/3 steps) with assistance level (OT)  Outcome: Not Met (add Reason) Note: Pt requires total A at bed level for toileting tasks. Dwayna Kentner, OTR/L   Problem: RH Toilet Transfers Goal: LTG Patient will perform toilet transfers w/assist (OT) Description: LTG: Patient will perform toilet transfers with assist, with/without cues using equipment (OT) Outcome: Not Met (add Reason) Note: Pt requires mod-max A for sliding board transfers. Gracin Soohoo, OTR/L

## 2019-06-06 NOTE — Progress Notes (Signed)
Occupational Therapy Discharge Summary  Patient Details  Name: Rebecca Lucas MRN: [id number] Date of Birth: 1956/02/07   Patient has met 4 of 7 long term goals due to improved activity tolerance, ability to compensate for deficits and improved coordination.  Patient to discharge at [facility] Max Assist level.  Patient's care partner is independent to provide the necessary physical assistance at discharge.   Pt with severe medical decline over the past 1.5 weeks which has greatly reduced her ability to complete functional mobility and ADLs at independent level. Prior to set back, pt was completing ADLs and stand pivot transfers with close supervision using RW. However, following medical decline and increase in pain,pt now requires min-mod A for use of sliding board for basic transfers. Recommend bathing/dressing to be completed at bed level for energy conservation and increased safety. Recommend w/c level only at d/c.  Pt's sister and husband plan to be primary caregivers at d/c. They have completed hands on family training and demonstrate willing and ableness to provide needed physical assist at d/c.   Reasons goals not met: Pt with recent medical decline impacting functional mobility and ability to complete ADLs. Toileting task now total A bed level as pt requires in/out cathing  And incontinent of bowel. Recommend LB bathing/dressing to take place at bed level.   Recommendation:  Patient will benefit from ongoing skilled OT services in home health setting to continue to advance functional skills in the area of BADL and Reduce care partner burden.  Equipment: Drop arm BSC, sliding board, hospital bed  Reasons for discharge: discharge from hospital  Patient/family agrees with progress made and goals achieved: Yes  OT Discharge Precautions/Restrictions  Precautions Precautions: Fall;Back Precaution Booklet Issued: No Required Braces or Orthoses: Spinal Brace Spinal Brace: Lumbar  corset;Applied in sitting position Restrictions Weight Bearing Restrictions: No Vision Baseline Vision/History: Wears glasses Wears Glasses: Reading only Patient Visual Report: No change from baseline Vision Assessment?: No apparent visual deficits Perception  Perception: Within Functional Limits Praxis Praxis: Intact Cognition Overall Cognitive Status: Within Functional Limits for tasks assessed Arousal/Alertness: Awake/alert Orientation Level: Oriented X4 Sustained Attention: Appears intact Divided Attention: Appears intact Memory: Appears intact Awareness: Appears intact Problem Solving: Appears intact Safety/Judgment: Appears intact Sensation Sensation Light Touch: Impaired Detail Light Touch Impaired Details: Impaired RLE;Impaired LLE Proprioception: Impaired Detail Proprioception Impaired Details: Impaired RLE;Impaired LLE Coordination Gross Motor Movements are Fluid and Coordinated: No Fine Motor Movements are Fluid and Coordinated: Yes Coordination and Movement Description: impaired 2/2 pain and LLE weakness Motor  Motor Motor: Abnormal postural alignment and control;Paraplegia Motor - Discharge Observations: Impaired 2/2 pain, generalized weakness and paraperesis, LLE>RLE Trunk/Postural Assessment  Cervical Assessment Cervical Assessment: Within Functional Limits Thoracic Assessment Thoracic Assessment: Exceptions to WFL(Back pre-cautions) Lumbar Assessment Lumbar Assessment: Exceptions to WFL(Back pre-cautions) Postural Control Postural Control: Deficits on evaluation  Balance Balance Balance Assessed: Yes Static Sitting Balance Static Sitting - Balance Support: Feet supported;Bilateral upper extremity supported Static Sitting - Level of Assistance: 5: Stand by assistance Dynamic Sitting Balance Dynamic Sitting - Balance Support: Feet supported;During functional activity Dynamic Sitting - Level of Assistance: 4: Min assist Sitting balance - Comments:  Sitting EOB Extremity/Trunk Assessment RUE Assessment RUE Assessment: Not tested General Strength Comments: Not formally assessed 2/2 pain, able to use [facility] for bed mobility tasks and sliding board transfers LUE Assessment LUE Assessment: Not tested General Strength Comments: Not formally assessed 2/2 pain, able to use [facility] for bed mobility tasks and sliding board transfers   Malea Swilling  L 06/06/2019, 3:23 PM

## 2019-06-06 NOTE — Telephone Encounter (Signed)
R/s appt per 9/22 sch message . - pt is aware of appt date and time

## 2019-06-06 NOTE — Progress Notes (Signed)
Physical Therapy Discharge Summary  Patient Details  Name: Rebecca Lucas MRN: [id number] Date of Birth: 1955-11-16  Today's Date: 06/06/2019 PT Individual Time: 7124-5809 PT Individual Time Calculation (min): 70 min    Patient has met 4 of 5 long term goals due to improved activity tolerance, improved balance and ability to compensate for deficits.  Patient to discharge at a wheelchair level [place].   Patient's care partner is independent to provide the necessary physical assistance at discharge. Patient's husband and sister have completed hands-on family education session and demonstrate good ability to assist pt with bed mobility and transfers. Once pt is up in w/c she it mod I.  Reasons goals not met: Pt did not meet goal of min A for transfers and remains at mod A overall for transfers especially with onset of fatigue. Pt's family demonstrates good ability to assist pt with this level of transfer.  Recommendation:  Patient will benefit from ongoing skilled PT services in home health setting to continue to advance safe functional mobility, address ongoing impairments in strength, endurance, balance, safety, independence with functional mobility, and minimize fall risk.  Equipment: Hospital bed and 30" slide board  Reasons for discharge: treatment goals met and discharge from hospital  Patient/family agrees with progress made and goals achieved: Yes   Skilled Intervention: Pt received sidelying in bed, sister [name] and husband Gershon Mussel present for hands-on family education session. Pt reports 7/10 pain in low back, is able to receive pain medication at beginning of therapy session. Supine to sit with mod A for BLE management. Slide board transfer bed to w/c with min A. Demonstrated how to assist pt with bed mobility and slide board transfer. Demonstrated management of w/c parts, pt's family demos good understanding and carryover of education and how to manage w/c parts. Pt's family then  performs return demo of slide board transfer w/c to/from mat table, pt requires mod A for slide board transfer due to onset of fatigue. Pt's family demos good understanding of how to assist pt with slide board transfer. Car transfer via slide board with mod A for transfer and for BLE management in/out of car. Pt's family is able to perform return demo of transfer. Discussed options for setting up seat in car for transfer and where to hold onto for support with transfer. Manual w/c propulsion x 150 ft with use of BUE at mod I level. Slide board transfer back to bed mod A. Sit to supine mod A. Pt's family demonstrates good ability to assist pt safely with transfers. Pt is safe to d/c home tomorrow with family. Pt left sidelying in bed in care of RN for I/O cath.  PT Discharge Precautions/Restrictions Precautions Precautions: Fall;Back Precaution Booklet Issued: No Precaution Comments: reviewed spinal precautions Required Braces or Orthoses: Spinal Brace Spinal Brace: Lumbar corset;Applied in sitting position Restrictions Weight Bearing Restrictions: No Vision/Perception  Perception Perception: Within Functional Limits Praxis Praxis: Intact  Cognition Overall Cognitive Status: Within Functional Limits for tasks assessed Arousal/Alertness: Awake/alert Orientation Level: Oriented X4 Attention: Sustained Sustained Attention: Appears intact Divided Attention: Appears intact Memory: Appears intact Awareness: Appears intact Problem Solving: Appears intact Safety/Judgment: Appears intact Sensation Sensation Light Touch: Impaired Detail Light Touch Impaired Details: Impaired RLE;Impaired LLE Proprioception: Impaired Detail Proprioception Impaired Details: Impaired RLE;Impaired LLE Coordination Gross Motor Movements are Fluid and Coordinated: No Fine Motor Movements are Fluid and Coordinated: Yes Coordination and Movement Description: impaired 2/2 pain and LLE weakness Motor  Motor Motor:  Abnormal postural alignment and  control;Paraplegia Motor - Skilled Clinical Observations: impaired 2/2 pain and weakness Motor - Discharge Observations: Impaired 2/2 pain, generalized weakness and paraperesis, LLE>RLE  Mobility Bed Mobility Bed Mobility: Rolling Right;Rolling Left;Supine to Sit;Sit to Supine Rolling Right: Minimal Assistance - Patient > 75% Rolling Left: Minimal Assistance - Patient > 75% Supine to Sit: Moderate Assistance - Patient 50-74% Sit to Supine: Moderate Assistance - Patient 50-74% Transfers Transfers: Lateral/Scoot Transfers Lateral/Scoot Transfers: Moderate Assistance - Patient 50-74% Transfer (Assistive device): Other (Comment)(slide board) Locomotion  Gait Ambulation: No Gait Gait: No Stairs / Additional Locomotion Stairs: No Wheelchair Mobility Wheelchair Mobility: Yes Wheelchair Assistance: Independent with Camera operator: Both upper extremities Wheelchair Parts Management: Needs assistance Distance: 150  Trunk/Postural Assessment  Cervical Assessment Cervical Assessment: Within Functional Limits Thoracic Assessment Thoracic Assessment: Exceptions to WFL(back precautions) Lumbar Assessment Lumbar Assessment: Exceptions to WFL(back precautions) Postural Control Postural Control: Deficits on evaluation  Balance Balance Balance Assessed: Yes Static Sitting Balance Static Sitting - Balance Support: Feet supported;Bilateral upper extremity supported Static Sitting - Level of Assistance: 5: Stand by assistance Dynamic Sitting Balance Dynamic Sitting - Balance Support: Feet supported;During functional activity Dynamic Sitting - Level of Assistance: 4: Min assist Sitting balance - Comments: Sitting EOB Static Standing Balance Static Standing - Balance Support: Bilateral upper extremity supported;During functional activity Static Standing - Level of Assistance: 2: Max assist Dynamic Standing Balance Dynamic Standing -  Balance Support: Bilateral upper extremity supported;During functional activity Dynamic Standing - Level of Assistance: 1: +2 Total assist Extremity Assessment  RUE Assessment RUE Assessment: Not tested General Strength Comments: Not formally assessed 2/2 pain, able to use [facility] for bed mobility tasks and sliding board transfers LUE Assessment LUE Assessment: Not tested General Strength Comments: Not formally assessed 2/2 pain, able to use [facility] for bed mobility tasks and sliding board transfers RLE Assessment RLE Assessment: Exceptions to [facility] Passive Range of Motion (PROM) Comments: [facility] General Strength Comments: impaired, see below RLE Strength Right Hip Flexion: 3+/5 Right Knee Flexion: 4/5 Right Knee Extension: 4/5 Right Ankle Dorsiflexion: 4/5 LLE Assessment LLE Assessment: Exceptions to [facility] Passive Range of Motion (PROM) Comments: [facility] General Strength Comments: impaired, see below LLE Strength Left Hip Flexion: 1/5 Left Knee Flexion: 0/5 Left Knee Extension: 0/5 Left Ankle Dorsiflexion: 0/5     Excell Seltzer, PT, DPT 06/06/2019, 3:50 PM

## 2019-06-07 ENCOUNTER — Telehealth: Payer: Self-pay | Admitting: Urology

## 2019-06-07 LAB — CBC WITH DIFFERENTIAL/PLATELET
Abs Immature Granulocytes: 0.43 10*3/uL — ABNORMAL HIGH (ref 0.00–0.07)
Basophils Absolute: 0 10*3/uL (ref 0.0–0.1)
Basophils Relative: 0 %
Eosinophils Absolute: 0 10*3/uL (ref 0.0–0.5)
Eosinophils Relative: 0 %
HCT: 23.7 % — ABNORMAL LOW (ref 36.0–46.0)
Hemoglobin: 7.6 g/dL — ABNORMAL LOW (ref 12.0–15.0)
Immature Granulocytes: 3 %
Lymphocytes Relative: 1 %
Lymphs Abs: 0.2 10*3/uL — ABNORMAL LOW (ref 0.7–4.0)
MCH: 31.7 pg (ref 26.0–34.0)
MCHC: 32.1 g/dL (ref 30.0–36.0)
MCV: 98.8 fL (ref 80.0–100.0)
Monocytes Absolute: 0.7 10*3/uL (ref 0.1–1.0)
Monocytes Relative: 5 %
Neutro Abs: 13.7 10*3/uL — ABNORMAL HIGH (ref 1.7–7.7)
Neutrophils Relative %: 91 %
Platelets: 145 10*3/uL — ABNORMAL LOW (ref 150–400)
RBC: 2.4 MIL/uL — ABNORMAL LOW (ref 3.87–5.11)
RDW: 16.2 % — ABNORMAL HIGH (ref 11.5–15.5)
WBC: 15 10*3/uL — ABNORMAL HIGH (ref 4.0–10.5)
nRBC: 0.1 % (ref 0.0–0.2)

## 2019-06-07 LAB — COMPREHENSIVE METABOLIC PANEL
ALT: 39 U/L (ref 0–44)
AST: 31 U/L (ref 15–41)
Albumin: 1.9 g/dL — ABNORMAL LOW (ref 3.5–5.0)
Alkaline Phosphatase: 126 U/L (ref 38–126)
Anion gap: 10 (ref 5–15)
BUN: 19 mg/dL (ref 8–23)
CO2: 25 mmol/L (ref 22–32)
Calcium: 8.2 mg/dL — ABNORMAL LOW (ref 8.9–10.3)
Chloride: 101 mmol/L (ref 98–111)
Creatinine, Ser: 0.56 mg/dL (ref 0.44–1.00)
GFR calc Af Amer: >60 mL/min (ref >60)
GFR calc non Af Amer: >60 mL/min (ref >60)
Glucose, Bld: 117 mg/dL — ABNORMAL HIGH (ref 70–99)
Potassium: 4.3 mmol/L (ref 3.5–5.1)
Sodium: 136 mmol/L (ref 135–145)
Total Bilirubin: 0.4 mg/dL (ref 0.3–1.2)
Total Protein: 5.3 g/dL — ABNORMAL LOW (ref 6.5–8.1)

## 2019-06-07 MED ORDER — ONDANSETRON HCL 4 MG PO TABS: 4.0000 mg | ORAL_TABLET | Freq: Four times a day (QID) | ORAL | 0 refills | Status: AC | PRN

## 2019-06-07 MED ORDER — LINACLOTIDE 145 MCG PO CAPS: 145.0000 ug | ORAL_CAPSULE | Freq: Every day | ORAL | 1 refills | Status: AC

## 2019-06-07 MED ORDER — TRAZODONE HCL 50 MG PO TABS: 50.0000 mg | ORAL_TABLET | Freq: Every evening | ORAL | 0 refills | Status: AC | PRN

## 2019-06-07 MED ORDER — FENTANYL 75 MCG/HR TD PT72: 1.0000 | MEDICATED_PATCH | TRANSDERMAL | 0 refills | Status: AC

## 2019-06-07 MED ORDER — CALCIUM CARBONATE-VITAMIN D 500-200 MG-UNIT PO TABS: 1.0000 | ORAL_TABLET | Freq: Two times a day (BID) | ORAL | 1 refills | Status: AC

## 2019-06-07 MED ORDER — OXYCODONE HCL 10 MG PO TABS: 10.0000 mg | ORAL_TABLET | ORAL | 0 refills | Status: AC | PRN

## 2019-06-07 MED ORDER — PANTOPRAZOLE SODIUM 40 MG PO TBEC: 40.0000 mg | DELAYED_RELEASE_TABLET | Freq: Two times a day (BID) | ORAL | 0 refills | Status: AC

## 2019-06-07 MED ORDER — LIDOCAINE 5 % EX PTCH: 1.0000 | MEDICATED_PATCH | CUTANEOUS | 0 refills | Status: AC

## 2019-06-07 MED ORDER — METHOCARBAMOL 500 MG PO TABS: 500.0000 mg | ORAL_TABLET | Freq: Three times a day (TID) | ORAL | 0 refills | Status: AC | PRN

## 2019-06-07 MED ORDER — ENOXAPARIN SODIUM 40 MG/0.4ML ~~LOC~~ SOLN: 40.0000 mg | SUBCUTANEOUS | 3 refills | Status: AC

## 2019-06-07 MED ORDER — METOPROLOL TARTRATE 25 MG PO TABS: 12.5000 mg | ORAL_TABLET | Freq: Two times a day (BID) | ORAL | 0 refills | Status: AC

## 2019-06-07 MED ORDER — GABAPENTIN 300 MG PO CAPS: 300.0000 mg | ORAL_CAPSULE | Freq: Three times a day (TID) | ORAL | 0 refills | Status: AC

## 2019-06-07 MED ORDER — SUCRALFATE 1 G PO TABS: 1.0000 g | ORAL_TABLET | Freq: Three times a day (TID) | ORAL | 0 refills | Status: AC

## 2019-06-07 MED ORDER — ACETAMINOPHEN 325 MG PO TABS: 650.0000 mg | ORAL_TABLET | Freq: Four times a day (QID) | ORAL | Status: AC

## 2019-06-07 MED ORDER — K PHOS MONO-SOD PHOS DI & MONO 155-852-130 MG PO TABS: 500.0000 mg | ORAL_TABLET | Freq: Every day | ORAL | 0 refills | Status: AC

## 2019-06-07 MED ORDER — TAMSULOSIN HCL 0.4 MG PO CAPS: 0.4000 mg | ORAL_CAPSULE | Freq: Every day | ORAL | 1 refills | Status: AC

## 2019-06-07 MED FILL — traZODone HCL 50 MG TABS: 50 | 30 days supply | Qty: 30 | Fill #0

## 2019-06-07 MED FILL — oxyCODONE HCL 10 MG TABS: 10 | 10 days supply | Qty: 60 | Fill #0

## 2019-06-07 MED FILL — fentaNYL 75 MCG/HR PT72: 75 | 30 days supply | Qty: 10 | Fill #0

## 2019-06-07 MED FILL — ENOXAPARIN SODIUM 40 MG/0.4: 40 | 30 days supply | Qty: 12 | Fill #0

## 2019-06-07 MED FILL — METHOCARBAMOL 500 MG TABS: 500 | 20 days supply | Qty: 60 | Fill #0

## 2019-06-07 MED FILL — LIDOCAINE PATCH 5%: 5 | 30 days supply | Qty: 30 | Fill #0

## 2019-06-07 MED FILL — GABAPENTIN 300 MG CAPSULE: 300 | 30 days supply | Qty: 90 | Fill #0

## 2019-06-07 MED FILL — LINZESS 145 MCG CAPSULE: 145 | 30 days supply | Qty: 30 | Fill #0

## 2019-06-07 MED FILL — METOPROLOL TARTRATE 25 MG T: 25 | 60 days supply | Qty: 60 | Fill #0

## 2019-06-07 MED FILL — VIRT-PHOS 250 NEUTRAL TAB: 155-852-130 | 15 days supply | Qty: 30 | Fill #0

## 2019-06-07 MED FILL — ONDANSETRON HCL 4 MG TABLET: 4 | 5 days supply | Qty: 20 | Fill #0

## 2019-06-07 MED FILL — SUCRALFATE 1 GM TABLET: 1 | 23 days supply | Qty: 90 | Fill #0

## 2019-06-07 MED FILL — PANTOPRAZOLE SOD DR 40 MG T: 40 | 30 days supply | Qty: 60 | Fill #0

## 2019-06-07 MED FILL — TAMSULOSIN HCL 0.4 MG CAP: 0.4 | 30 days supply | Qty: 30 | Fill #0

## 2019-06-07 NOTE — Patient Care Conference (Signed)
Inpatient RehabilitationTeam Conference and Plan of Care Update Date: 06/05/2019   Time: 9:55 AM    Patient Name: Rebecca Lucas      Medical Record Number: [id number]  Date of Birth: February 20, 1956 Sex: Female         Room/Bed: 4W09C/4W09C-01 Payor Info: Payor: [employer] / Plan: [employer] / Product Type: *No Product type* /    Admit Date/Time:  05/14/2019  3:58 PM  Primary Diagnosis:  Cauda equina syndrome [facility])  Patient Active Problem List   Diagnosis Date Noted  . Hypophosphatemia   . Thrombocytopenia ([place])   . Hypokalemia   . Sinus tachycardia   . Ascites   . Therapeutic opioid induced constipation   . Oral candidiasis   . Transaminitis   . Hyponatremia   . Acute lower UTI   . Cancer related pain   . Abdominal pain 05/29/2019  . Abdominal distension   . Steroid-induced hyperglycemia   . Leucocytosis   . Acute blood loss anemia   . Postoperative pain   . Bandemia   . Foot drop, left   . Neurogenic bowel   . Cauda equina syndrome ([place]) 05/14/2019  . Urinary retention   . Surgery, elective   . 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 Acute urinary retention 05/07/2019  . Elevated LFTs 05/07/2019  . 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 Oral thrush 04/04/2018  . Goals of care, counseling/discussion 04/04/2018  . Encounter for antineoplastic chemotherapy 04/04/2018  . Encounter for antineoplastic immunotherapy 04/04/2018  . 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 Metastatic malignant neoplasm ([place]) 03/06/2018  . Primary cancer of right upper lobe of lung ([place]) 03/06/2018  . Bone metastasis ([place]) 03/06/2018    Expected Discharge Date: Expected Discharge Date: 06/07/19  Team Members Present: Physician leading conference: Dr. Courtney Heys Social Worker Present: Alfonse Alpers, LCSW Nurse Present: Dietrich Pates, RN PT Present: Excell Seltzer, PT OT  Present: [name] Rounds, OT PPS Coordinator present : Gunnar Fusi, SLP     Current Status/Progress Goal Weekly Team Focus  Bowel/Bladder   incont B&B due to new fatigue, I&O cath q6h, LBM 9/22  encourage pt to void using BSC and maintain regular bowel pattern  assist with toileting/I&O caths as needed to prevent urinary retention   Swallow/Nutrition/ Hydration             ADL's   Min-mod A slide board transfers. Max A LB dressing and min A UB dressing bed level; mod A bathing bed level; total A toileting  Goals downgraded to mod-max A overall following medical set back.  ADL re-training, functional activity tolerance, OOB tolerance, family education and d/c planning   Mobility   mod A bed mobility, min to mod A slide board transfers, Supervision w/c mobility, max to total A sit to stand  downgraded to mod A overall at w/c level, gait goals discontinued  family education, d/c planning   Communication             Safety/Cognition/ Behavioral Observations            Pain   pt states constant pain in mid back, abd is very firm and distended but not tender, oxy increased to 10mg q4h prn, scheduled tylenol, roboxin and tramadol available prn  keep pain at a functional level for pt to participate in therapy and ADLs  assess pain qshift and prn, medicate and  reposition in response   Skin   no skin breakdown, accessed port a cath to R chest wall  skin remain free from injury  assess skin q shift and prn    Rehab Goals Patient on target to meet rehab goals: No Rehab Goals Revised: functional decline past couple of days due to medical/ pain issues - pt still needs to d/c in order to work on plan with Amber and progress notes for long and short-term goals.     Barriers to Discharge  Current Status/Progress Possible Resolutions Date Resolved   Nursing                  PT  Medical stability;Pending chemo/radiation;Neurogenic Bowel & Bladder  pt had a functional decline over  the past week due to new onset of abdominal pain              OT                  SLP                SW                Discharge Planning/Teaching Needs:  Pt to d/c home with spouse able to provide 24/7 assistance.  Education with husband and sister who will be with pt at d/c.   Team Discussion:  Pt with new spot on liver, 1.6cm, and Dr. Dagoberto Ligas has talked with family and oncology.  Dr. Dagoberto Ligas restarted Linzess and added pain medication to help pt be more comfortable.  Pt's pain somewhat better, but she kept dozing off during breakfast.  Pulse is 110s/120s, O2 sats are okay, but WBC is up and Dr. Hewitt Blade asked to be kept updated on pt's status and labs.  Pt may need more imaging.  Pt is incontinent of bowel and bladder and needs I&O caths every 6-8 hours.  Pt has declined functionally over the last 1 -2 weeks. She is min/mod A with slide board txs and mod/max A with bed level ADLs.  Pt not doing well with PT, she is mod A overall.  Pt needs to d/c by Friday, 06-07-19 to start oncology's plan.  Revisions to Treatment Plan:  none    Medical Summary Current Status: weaker- ordering MRI of cervical and lumbar spine-worse Neurogenic B/B Weekly Focus/Goal: d/c home so can start chemo  Barriers to Discharge: Home enviroment access/layout;Neurogenic Bowel & Bladder;Incontinence;Medical stability;Pending chemo/radiation  Barriers to Discharge Comments: worsening weakness Possible Resolutions to Barriers: chemo   Continued Need for Acute Rehabilitation Level of Care: The patient requires daily medical management by a physician with specialized training in physical medicine and rehabilitation for the following reasons: Direction of a multidisciplinary physical rehabilitation program to maximize functional independence : Yes Medical management of patient stability for increased activity during participation in an intensive rehabilitation regime.: Yes Analysis of laboratory values and/or radiology  reports with any subsequent need for medication adjustment and/or medical intervention. : Yes   I attest that I was present, lead the team conference, and concur with the assessment and plan of the team.Team conference was held via web/ teleconference due to [place] - 19.   Kimberla Driskill, Silvestre Mesi 06/07/2019, 12:44 AM

## 2019-06-07 NOTE — Discharge Instructions (Signed)
Inpatient Rehab Discharge Instructions  Rebecca Lucas Discharge date and time: No discharge date for patient encounter.   Activities/Precautions/ Functional Status: Activity: activity as tolerated Diet: regular diet Wound Care: keep wound clean and dry Functional status:  ___ No restrictions     ___ Walk up steps independently ___ 24/7 supervision/assistance   ___ Walk up steps with assistance ___ Intermittent supervision/assistance  ___ Bathe/dress independently ___ Walk with walker     _x__ Bathe/dress with assistance ___ Walk Independently    ___ Shower independently ___ Walk with assistance    ___ Shower with assistance ___ No alcohol     ___ Return to work/school ________      COMMUNITY REFERRALS UPON DISCHARGE:    Home Health:   PT     OT      RN                  Agency:  [facility]    Phone: [phone]   Medical Equipment/Items Ordered: rolling walker, commode, 30" transfer board, hospital bed                                                      Agency/Supplier: [facility] @ [phone]      Special Instructions: No driving smoking or alcohol.  Subcutaneous LOVENOX 40 mg daily until 08/09/2019 and stop  Follow up Oncology on plan of resuming Chemotherapy   My questions have been answered and I understand these instructions. I will adhere to these goals and the provided educational materials after my discharge from the hospital.  Patient/Caregiver Signature _______________________________ Date __________  Clinician Signature _______________________________________ Date __________  Please bring this form and your medication list with you to all your follow-up doctor's appointments.

## 2019-06-07 NOTE — Plan of Care (Signed)
  Problem: Consults Goal: RH SPINAL CORD INJURY PATIENT EDUCATION Description:  See Patient Education module for education specifics.  Outcome: Completed/Met Goal: Skin Care Protocol Initiated - if Braden Score 18 or less Description: If consults are not indicated, leave blank or document N/A Outcome: Completed/Met   Problem: SCI BOWEL ELIMINATION Goal: RH STG MANAGE BOWEL WITH ASSISTANCE Description: STG Manage Bowel with min Assistance. Outcome: Completed/Met Goal: RH STG SCI MANAGE BOWEL WITH MEDICATION WITH ASSISTANCE Description: STG SCI Manage bowel with medication with min assistance. Outcome: Completed/Met Goal: RH STG SCI MANAGE BOWEL PROGRAM W/ASSIST OR AS APPROPRIATE Description: STG SCI Manage bowel program min assist or as appropriate. Outcome: Completed/Met   Problem: RH SKIN INTEGRITY Goal: RH STG MAINTAIN SKIN INTEGRITY WITH ASSISTANCE Description: STG Maintain Skin Integrity With min Assistance. Outcome: Completed/Met   Problem: RH PAIN MANAGEMENT Goal: RH STG PAIN MANAGED AT OR BELOW PT'S PAIN GOAL Description: < 4 Outcome: Completed/Met

## 2019-06-07 NOTE — Progress Notes (Signed)
[facility] PHYSICAL MEDICINE & REHABILITATION PROGRESS NOTE   Subjective/Complaints:   Pt reports no issues today, however appears more vague- said she ate all of her sausage, however it's sitting in front of her with 1 bite out of it- it appears to me, although she gets better pain control, she's having worsening memory on 10 mg Oxycodone.  To go home today-talked with husband- suggest using 5 mg oxy if possible, and only using 10 mg oxy when truly needed.  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 Tammi Klippel- her rad oncologist- have L/M was not able to reach at Cancer center, but l/m on voicemail and sent computer conversation as well.   ROS: denies abd pain; and back pain Denies CP, shortness of breath, leg swelling.  Objective:   Mr Cervical Spine W Wo Contrast  Result Date: 06/06/2019 CLINICAL DATA:  Follow-up examination for metastatic spine tumor. EXAM: MRI CERVICAL SPINE WITHOUT AND WITH CONTRAST TECHNIQUE: Multiplanar and multiecho pulse sequences of the cervical spine, to include the craniocervical junction and cervicothoracic junction, were obtained without and with intravenous contrast. CONTRAST:  6.45mL GADAVIST GADOBUTROL 1 MMOL/ML IV SOLN COMPARISON:  Prior MRI from 04/06/2019. FINDINGS: Alignment: Trace 2 mm anterolisthesis of C2 on C3, stable. Straightening with slight reversal of the normal underlying cervical lordosis. Vertebrae: Enhancing osseous metastatic lesion involving the posterior elements of C2 and C3 is relatively unchanged from previous exam. No interval increase in size or encroachment upon the adjacent spinal canal. No new osseous lesions. Underlying bone marrow signal intensity remains within normal limits. Cord: Signal intensity within the cervical spinal cord is normal. Normal cord caliber and morphology. No intramedullary or epidural enhancement. Posterior Fossa, vertebral arteries, paraspinal  tissues: Visualized brain and posterior fossa of demonstrates no significant finding. Negative craniocervical junction. Paraspinous and prevertebral soft tissues within normal limits. Normal intravascular flow voids noted within the vertebral arteries bilaterally. Intraluminal fluid noted within the partially visualized mid esophagus. Disc levels: C2-C3: Negative interspace.  Mild facet hypertrophy.  No stenosis. C3-C4: Small left paracentral disc protrusion, unchanged. No canal or foraminal stenosis. C4-C5: Mild disc bulging with uncovertebral hypertrophy. No significant canal or foraminal stenosis. C5-C6: Chronic diffuse degenerative disc osteophyte with intervertebral disc space narrowing. Mild facet hypertrophy. Flattening of the ventral thecal sac without significant spinal stenosis. Unchanged mild left foraminal stenosis. Right neural foramen remains patent. C6-C7:  Small central disc protrusion, unchanged.  No stenosis. C7-T1:  Unremarkable. Visualized upper thoracic spine demonstrates no significant finding. IMPRESSION: 1. No significant interval change in osseous metastatic disease involving the posterior elements of C2 and C3. No encroachment upon the adjacent spinal canal. 2. No evidence for new or progressive metastatic disease within the cervical spine. 3. Mild multilevel cervical spondylosis as above, stable. Electronically Signed   By: Jeannine Boga M.D.   On: 06/06/2019 00:02   Mr Lumbar Spine W Wo Contrast  Result Date: 06/06/2019 CLINICAL DATA:  Worsening left lower extremity weakness and bladder/bowel incontinence. 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 EXAM: MRI LUMBAR SPINE WITHOUT AND WITH CONTRAST TECHNIQUE: Multiplanar and multiecho pulse sequences of the lumbar spine were obtained without and with intravenous contrast. CONTRAST:  6.4mL GADAVIST GADOBUTROL 1 MMOL/ML IV SOLN  COMPARISON:  CT abdomen pelvis dated June 03, 2019. MRI lumbar spine dated May 07, 2019. FINDINGS: Segmentation:  Standard. Alignment:  Unchanged mild retropulsion of L2.  Vertebrae: Interval posterior decompression at L2 with T12-L4 posterior fusion. Tumor involving the L1, L2, and L3 vertebral bodies is largely unchanged. Anterior posterior paravertebral tumor extension has decreased however. Unchanged severe pathologic L2 compression fracture. Conus medullaris and cauda equina: Conus extends to the L1 level. There is new abnormal increased T2 signal within the lower spinal cord, extending from T11-T12 to the conus. There is progressive diffuse leptomeningeal thickening and enhancement of the lower spinal cord and cauda equina nerve roots. Paraspinal and other soft tissues: The right sacral ala and left posterior iliac metastases are stable in size. Disc levels: T12-L1: Unchanged small left paracentral disc protrusion. No stenosis. L1-L2: Decreased epidural tumor with mild residual tumor on the left. Improved now mild spinal canal stenosis and moderate left lateral recess stenosis. Decreased epidural tumor in the left neural foramen. L2-L3: Decreased epidural tumor with mild residual tumor on the left. Improved now mild spinal canal stenosis. Decreased epidural tumor in the left neural foramen. L3-L4:  Negative. L4-L5:  Negative. L5-S1:  Negative. IMPRESSION: 1. Progressive diffuse leptomeningeal thickening and enhancement of the lower spinal cord and cauda equina nerve roots, consistent with worsening metastatic disease. New spinal cord edema extending from T11-T12 to the conus. 2. Interval posterior decompression at L2 with T12-L4 posterior fusion. Decreased epidural tumor at L1-L2 and L2-L3 with improved spinal canal stenosis. 3. Grossly unchanged metastases involving the L1, L2, and L3 vertebral bodies with slightly decreased anterior paravertebral extension. 4. Grossly unchanged right sacral ala and  left posterior iliac metastases. Electronically Signed   By: Titus Dubin M.D.   On: 06/06/2019 13:17   Recent Labs    06/06/19 0419 06/07/19 0604  WBC 16.4* 15.0*  HGB 8.2* 7.6*  HCT 25.2* 23.7*  PLT 156 145*   Recent Labs    06/06/19 0419 06/07/19 0604  NA 133* 136  K 3.4* 4.3  CL 98 101  CO2 23 25  GLUCOSE 108* 117*  BUN 24* 19  CREATININE 0.54 0.56  CALCIUM 8.1* 8.2*    Intake/Output Summary (Last 24 hours) at 06/07/2019 0918 Last data filed at 06/07/2019 0258 Gross per 24 hour  Intake 717 ml  Output 700 ml  Net 17 ml     Physical Exam: Vital Signs Blood pressure (!) 119/59, pulse (!) 122, temperature 98.6 F (37 C), temperature source Oral, resp. rate 16, height 5\' 7"  (1.702 m), weight 59.6 kg, SpO2 91 %.sats 95% on RA when checked myself. Constitutional: No distress . Vital signs and labs reviewed. Awake, still alert, but poor memory regarding sausage as above; sitting up in front of breakfast; very vague, NAD HENT: Normocephalic.  Atraumatic. Eyes: EOMI. No discharge. Cardiovascular: tachycardic, regular rhythm still Respiratory: CTA B/L. Denies SOB; rechecked sats- was 95% on RA GI: some mild abdominal protrusion; soft; no TTP; (+)BS Skin: Warm and dry.  Intact. Psych: brighter affect Musc: No edema in extremities. TTP over R trochanteric bursa c/w bursitis Neuro: Alert Motor: Bilateral upper extremities: 5/5 proximal distal Right lower extremity: 3-/5 proximal distal, unchanged Left lower extremity: 0/5 proximal to distal, unchanged  Assessment/Plan: 1. 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  Physiatrist is providing close team supervision and 24 hour management of active medical problems listed below.  Physiatrist and rehab team  continue to assess barriers to discharge/monitor patient progress toward  functional and medical goals  Care Tool:  Bathing  Bathing activity did not occur: Refused Body parts bathed by patient: Right upper leg, Left upper leg, Right arm, Left arm, Chest, Abdomen, Face   Body parts bathed by helper: Front perineal area, Buttocks, Right lower leg, Left lower leg     Bathing assist Assist Level: Moderate Assistance - Patient 50 - 74%     Upper Body Dressing/Undressing Upper body dressing   What is the patient wearing?: Pull over shirt Orthosis activity level: Performed by helper  Upper body assist Assist Level: Minimal Assistance - Patient > 75%    Lower Body Dressing/Undressing Lower body dressing      What is the patient wearing?: Pants     Lower body assist Assist for lower body dressing: Total Assistance - Patient < 25%     Toileting Toileting Toileting Activity did not occur Landscape architect and hygiene only): Refused  Toileting assist Assist for toileting: Total Assistance - Patient < 25%     Transfers Chair/bed transfer  Transfers assist     Chair/bed transfer assist level: Moderate Assistance - Patient 50 - 74%     Locomotion Ambulation   Ambulation assist   Ambulation activity did not occur: Safety/medical concerns  Assist level: Contact Guard/Touching assist Assistive device: Walker-rolling Max distance: 45'   Walk 10 feet activity   Assist  Walk 10 feet activity did not occur: Safety/medical concerns  Assist level: Contact Guard/Touching assist Assistive device: Walker-rolling   Walk 50 feet activity   Assist Walk 50 feet with 2 turns activity did not occur: Safety/medical concerns         Walk 150 feet activity   Assist Walk 150 feet activity did not occur: Safety/medical concerns         Walk 10 feet on uneven surface  activity   Assist Walk 10 feet on uneven surfaces activity did not occur: Safety/medical  concerns         Wheelchair     Assist Will patient use wheelchair at discharge?: Yes Type of Wheelchair: Manual Wheelchair activity did not occur: Safety/medical concerns  Wheelchair assist level: Independent Max wheelchair distance: 150'    Wheelchair 50 feet with 2 turns activity    Assist    Wheelchair 50 feet with 2 turns activity did not occur: Safety/medical concerns   Assist Level: Independent   Wheelchair 150 feet activity     Assist    Wheelchair 150 feet activity did not occur: Safety/medical concerns   Assist Level: Independent   Medical Problem List and Plan: 1.Decreased functional mobilitysecondary to 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   Continue CIR -Back brace when out of bed. -Plan is to follow-up outpatient Dr. Earlie Server in regards to chemotherapy  Chemotherapy was to begin on 9/17, however now on hold  9/23- will get MRI of cervical and lumbar spine due to concerns for additional mets because according to therapy, pt weaker esp compared to 1-2 weeks ago- also more Bowel/bladders issues (had incontinent bowel episode- didn't realize it x1)   9/24- family training today.  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 Antithrombotics: -DVT/anticoagulation:SCDs. Lovenox  LE venous dopplers negative  for DVT  9/23- needs for a total of 3 months -antiplatelet therapy: N/A 3. Pain Management:  Neurontin 300 mg 3 times daily  Continue patch 75 mcg, Advil 400 mg  Ibuprofen changed to PRN on 9/5, [place]  on 9/17 due to EGD findings and discussion with hospitalist  9/16- oxy is q4 hours prn   Lidoderm patch added on 9/17 to back with now mild improvement  Robaxin 500 3 times daily as needed started on 9/20  9/21- R trochanteric bursitis- somewhat improved with robaxin and lidocaine patch  9/22- increased oxycodone to 10 mg q4 hours prn  9/23- helping a lot- a little sleepy  9/25- suggested taking 5 mg oxy unless really needs 10 mg 4. Mood:motivational support -antipsychotic agents: N/A 5. Neuropsych: This patientiscapable of making decisions on herown behalf. 6. Skin/Wound Care:Routine skin checks 7. Fluids/Electrolytes/Nutrition:Routine in and outs  P.o. intake improving, IVF DC'd on 9/20 8. Acute blood loss anemia.   Hemoglobin 8.2 on 9/20, labs ordered for tomorrow  Hemoccult ordered  Continue to await further GI recs  -con't carafate/PPI BID, etc and will also arrange f/u with GI after d/c.  Continue to monitor 9. Leukocytosis . Continue to monitor  WBCs 12.6 on 9/20  9/21- WBC up to 13.7- IM ordering CT of abd/pelvis  9/22- WBC up to 14.4- pulse 123- sats 95-97%- she feels well- no complaints except back pain- will monitor closely.  9/23- labs in AM- feeling great- afebrile   9/24- still afebrile; feeling great- per d/w ID, don't treat pt, not labs- due to concern will double check in AM prior to d/c  9/25- WBC down to 15k-   Afebrile  Continue to monitor 10. Urinary retention/acute lower UTI:  Keflex, started on 9/13, changed to [place] on 9/17, [place] on 9/20 11. Constipation in setting of Neurogenic bowel and opioid-induced constipation. Adjust bowel program as needed  Bowel program with dig stim at night/evening.   Linzess changed to [place] on 9/17, [place] by hospitalist-may need to resume  Miralax DC'd  Appears to be improving  KUB personally reviewed, showing improvement since suggestion of ileus on 9/17  Improving on 9/20.  9/22- with increase in  oxycodone, will restart Linzess.   9/23- encouraged husband to monitor stools closely- easy to back up in SCI pts.  9/25- large stool documented yesterday 12.  Steroid-induced hyperglycemia  Continue to monitor  Elevated on 9/20 13. Severe abd pain  CT showed ascites and gallbladder sludge- GI consulted- ordered U/S guided paracentesis- GI ordered associated labs    EGD performed by GI, appreciate recs  PPI twice daily x8 weeks then daily as long as patient requires steroids  Await cytology results  Hospitalist following, appreciate recs, notes reviewed  Diet changed back to regular diet after being on liquids on 9/17  Improved 14.  Sleep disturbance  Trazodone 50 nightly as needed  15.  Hyponatremia  Sodium 133 on 9/19, labs ordered for tomorrow  9/25- Na 136-   Continue to monitor 16.  Transaminitis  LFTs elevated on 9/18, labs ordered for tomorrow  9/21- Slightly elevated, but improving- ALT 48; AST 30  Continue to monitor 17.  Oral candidiasis  Noted on EGD  Diflucan 100 mg daily x7 days per GI, started on 9/16 18.  Tachycardia  Likely secondary to pain  ECG reviewed, limited  Low-dose metoprolol started on 9/18  First troponin borderline negative, repeat negative 19.  Hypokalemia  Potassium 4.5 on 9/20, labs ordered for tomorrow  Supplement initiated, [place] on 9/20  Magnesium 1.7 on 9/20  9/24- Will replete K+ 3.4- 40 mEq x2  9/25- K+ 4.6 after repletion 20.  Thrombocytopenia  Platelets 126 on 9/20  9/23- plts 158k on lovenox  9/25- Plts 145k- on lovenox 21.  Hypophosphatemia  Noted to have critical value, supplement initiated  Repeat labs pending, also will ordered for tomorrow  9/21- 2.8- which is in normal levels  9/24- 2.8  22. Dispo  9/21- will discuss with pt/husband/team  9/22- d/w Dr Earlie Server- will send home Friday- pt/SW aware  Labs [facility]  9/24- will check labs in AM due to rising leukocytosis  9/25- slightly improved WBC   LOS: 24 days A FACE TO  FACE EVALUATION WAS PERFORMED  Delmar Dondero 06/07/2019, 9:18 AM

## 2019-06-07 NOTE — Progress Notes (Signed)
Pt discharged home with significant other. Belongings and extra supply of in and out catheter kits sent with pt. Pain med administered prior to discharge. Discharge instructions given by Linna Hoff, PA. No further questions from pt or family. Vital signs at baseline. Gerald Stabs, RN

## 2019-06-07 NOTE — Telephone Encounter (Signed)
I called and spoke with the patient by phone and Rebecca Lucas was quite exhausted from her recent discharge home from the hospital earlier today.  We discussed that Dr. Earlie Server had reached out to Dr. 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  Rebecca Lucas was discharged home on intermittent self-catheterization to empty the bladder adequately and continues taking Flomax daily.  Rebecca Lucas is requiring higher doses of pain medication to remain comfortable at this point and continues with intermittent bowel incontinence.  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  Rebecca Lucas is somewhat reluctant and reports that Rebecca Lucas is not sure that Rebecca Lucas is up for it but admits that currently Rebecca Lucas is exhausted from her recent hospital discharge.  I advised that we will tentatively schedule her for 10 AM for re-consult with Dr. Tammi Klippel followed by CT simulation for treatment planning thereafter in anticipation of beginning her radiation treatments on Tuesday or Wednesday of next week.  Rebecca Lucas commits to calling our office to let us know if Rebecca Lucas is not up for the appointment on Monday.  Otherwise, we will plan to proceed accordingly and Rebecca Lucas is comfortable and in agreement with the stated plan.  Rebecca Lucas will sign consent with Dr. Tammi Klippel at the time of her reconsult visit prior to [place].  Rebecca Lucas has a scheduled follow-up visit with Dr. Earlie Server on 06/13/2019 with plans to resume systemic chemotherapy at that time.  Her chemotherapy has been on hold throughout her recent hospital admission.   Nicholos Johns, MMS, PA-C [place] [facility] [place]: [phone]  Fax: [phone]

## 2019-06-07 NOTE — Progress Notes (Signed)
Social Work Patient ID: Rebecca Lucas, female   DOB: 1955/10/14, 63 y.o.   MRN: [id number]   CSW met with pt 06-05-19 to update her on team conference discussion and targeted d/c date of 06-07-19.  CSW spoke with pt's husband and sister on 06-06-19 to do the same and went over DME, HH, catheter orders, etc.  Questions answered and family education occurred .  Pt's sister may come back on day of d/c for more education, if needed.  Husband will definitely come.  CSW remains available to assist as needed.

## 2019-06-07 NOTE — Progress Notes (Signed)
Social Work Discharge Note   The overall goal for the admission was met for:   Discharge location: Yes - home with husband and sister  Length of Stay: Yes - 24 days  Discharge activity level: No - moderate assistance for PT tasks; maximal assistance for ADLs  Home/community participation: Yes  Services provided included: MD, RD, PT, OT, RN, TR, Pharmacy and SW  Financial Services: Private Insurance: [employer]  Follow-up services arranged: Home Health: PT/OT/RN from [facility], DME: rolling walker; commode; 30" transfer board; hospital bed from [facility] and Patient/Family has no preference for HH/DME agencies [facility] for catheter supplies  Comments (or additional information): Pt's husband and sister and dtr to assist pt at home. Husband and sister came for family education for therapy and nursing (Lovenox and bowel/bladder program training).  Patient/Family verbalized understanding of follow-up arrangements: Yes  Individual responsible for coordination of the follow-up plan: pt's husband, Dr. Thomas Washabaugh [phone]  Confirmed correct DME delivered: ,  [facility] 06/07/2019    ,  [facility] 

## 2019-06-08 ENCOUNTER — Ambulatory Visit: Payer: 59

## 2019-06-10 ENCOUNTER — Other Ambulatory Visit: Payer: Self-pay | Admitting: *Deleted

## 2019-06-10 ENCOUNTER — Encounter: Payer: Self-pay | Admitting: Internal Medicine

## 2019-06-10 ENCOUNTER — Ambulatory Visit: Payer: 59

## 2019-06-10 ENCOUNTER — Ambulatory Visit: Payer: 59 | Admitting: Radiation Oncology

## 2019-06-10 ENCOUNTER — Telehealth: Payer: Self-pay | Admitting: Internal Medicine

## 2019-06-10 ENCOUNTER — Telehealth: Payer: Self-pay | Admitting: Radiation Oncology

## 2019-06-10 ENCOUNTER — Ambulatory Visit
Admission: RE | Admit: 2019-06-10 | Discharge: 2019-06-10 | Disposition: A | Discharge status: Home / Self Care | Payer: 59 | Source: Ambulatory Visit | Attending: Radiation Oncology | Admitting: Radiation Oncology

## 2019-06-10 ENCOUNTER — Telehealth: Payer: Self-pay | Admitting: *Deleted

## 2019-06-10 DIAGNOSIS — C3411 Malignant neoplasm of upper lobe, right bronchus or lung: Secondary | ICD-10-CM

## 2019-06-10 NOTE — Progress Notes (Signed)
Histology and Location of Primary Cancer: metastatic poorly differentiated neoplasm, questionable for NSCLC with mets to spine   Sites of Visceral and Bony Metastatic Disease: diffuse leptomeningeal thickening of lower spinal cord  Location(s) of Symptomatic Metastases: leptomeningeal disease seen on her scans on 06/05/2019 at lower spine and cauda equina nerve roots after progressive lower extremity weakness and incontinence.   Past/Anticipated chemotherapy by medical oncology, if any:  04/11/2018 - 04/03/2019 Chemotherapy   The patient had palonosetron (ALOXI) injection 0.25 mg, 0.25 mg, Intravenous,  Once, 4 of 4 cycles Administration: 0.25 mg (04/11/2018), 0.25 mg (05/02/2018), 0.25 mg (05/22/2018), 0.25 mg (06/13/2018) PEMEtrexed (ALIMTA) 800 mg in sodium chloride 0.9 % 100 mL chemo infusion, 825 mg, Intravenous,  Once, 17 of 21 cycles Administration: 800 mg (04/11/2018), 800 mg (07/04/2018), 800 mg (05/02/2018), 800 mg (05/22/2018), 800 mg (07/24/2018), 800 mg (06/13/2018), 800 mg (08/16/2018), 800 mg (09/06/2018), 800 mg (09/27/2018), 800 mg (10/18/2018), 800 mg (11/08/2018), 800 mg (11/29/2018), 800 mg (12/20/2018), 800 mg (01/10/2019), 800 mg (01/31/2019), 800 mg (02/21/2019), 800 mg (03/14/2019) CARBOplatin (PARAPLATIN) 450 mg in sodium chloride 0.9 % 250 mL chemo infusion, 450 mg (100 % of original dose 450 mg), Intravenous,  Once, 4 of 4 cycles Dose modification: 450 mg (original dose 450 mg, Cycle 1), 462 mg (original dose 462 mg, Cycle 2), 462 mg (original dose 462 mg, Cycle 3), 462 mg (original dose 462 mg, Cycle 4) Administration: 450 mg (04/11/2018), 460 mg (05/02/2018), 460 mg (05/22/2018), 460 mg (06/13/2018) ondansetron (ZOFRAN) 8 mg, dexamethasone (DECADRON) 10 mg in sodium chloride 0.9 % 50 mL IVPB, , Intravenous,  Once, 1 of 1 cycle pembrolizumab (KEYTRUDA) 200 mg in sodium chloride 0.9 % 50 mL chemo infusion, 200 mg, Intravenous, Once, 17 of 21 cycles Administration: 200 mg (04/11/2018), 200 mg  (07/04/2018), 200 mg (05/02/2018), 200 mg (05/22/2018), 200 mg (07/24/2018), 200 mg (06/13/2018), 200 mg (08/16/2018), 200 mg (09/06/2018), 200 mg (09/27/2018), 200 mg (10/18/2018), 200 mg (11/08/2018), 200 mg (11/29/2018), 200 mg (12/20/2018), 200 mg (01/10/2019), 200 mg (01/31/2019), 200 mg (02/21/2019), 200 mg (03/14/2019) fosaprepitant (EMEND) 150 mg, dexamethasone (DECADRON) 12 mg in sodium chloride 0.9 % 145 mL IVPB, , Intravenous,  Once, 4 of 4 cycles Administration:  (04/11/2018),  (05/02/2018),  (05/22/2018),  (06/13/2018)  for chemotherapy treatment.    04/25/2019 -  Chemotherapy   The patient had pegfilgrastim (NEULASTA ONPRO KIT) injection 6 mg, 6 mg, Subcutaneous, Once, 1 of 6 cycles DOCEtaxel (TAXOTERE) 120 mg in sodium chloride 0.9 % 250 mL chemo infusion, 75 mg/m2 = 120 mg, Intravenous,  Once, 1 of 6 cycles Administration: 120 mg (04/25/2019) ramucirumab (CYRAMZA) 500 mg in sodium chloride 0.9 % 200 mL chemo infusion, 10 mg/kg = 500 mg, Intravenous, Once, 1 of 6 cycles Administration: 500 mg (04/25/2019)  for chemotherapy treatment.    Primary cancer of right upper lobe of lung ([place])  03/06/2018 Initial Diagnosis   Primary cancer of right upper lobe of lung ([place])   04/25/2019 -  Chemotherapy   The patient had pegfilgrastim (NEULASTA ONPRO KIT) injection 6 mg, 6 mg, Subcutaneous, Once, 1 of 6 cycles DOCEtaxel (TAXOTERE) 120 mg in sodium chloride 0.9 % 250 mL chemo infusion, 75 mg/m2 = 120 mg, Intravenous,  Once, 1 of 6 cycles Administration: 120 mg (04/25/2019) ramucirumab (CYRAMZA) 500 mg in sodium chloride 0.9 % 200 mL chemo infusion, 10 mg/kg = 500 mg, Intravenous, Once, 1 of 6 cycles Administration: 500 mg (04/25/2019)  for chemotherapy treatment.     Pain on  a scale of 0-10 is:    If Spine Met(s), symptoms, if any, include:  Bowel/Bladder retention or incontinence (please describe):   Numbness or weakness in extremities (please describe):   Current Decadron regimen, if applicable:    Ambulatory status? Walker? Wheelchair?: Wheelchair  SAFETY ISSUES:  Prior radiation? yes  Pacemaker/ICD? no  Possible current pregnancy? no  Is the patient on methotrexate? no  Current Complaints / other details:  63 year old female.   Patient decided not to pursue further treatment. Providers informed.

## 2019-06-10 NOTE — Patient Outreach (Signed)
[name]) Care Management  06/10/2019  Rebecca Lucas 04/26/1956 [id number]   Transition of care call/case closure/transitioning to Hospice Care  Referral received: 05/08/19 Initial outreach: 06/11/19 Insurance: [employer] Save Plan   Subjective: Initial successful telephone call to patient's preferred (home/mobile) number in order to complete transition of care assessment; patient's husband, Dr. [name], answered patient's phone. 2 HIPAA identifiers verified. Explained purpose of call and completed transition of care assessment since patient unable to talk due to terminal illness. Dr. [name] verified that a Hospice consult has been called and that he does not have any immediate questions or concerns but requests this RNCM contact information as he states "I'm sure Ill have questions in the future." When asked how he was doing with wife's prognosis, he stated "it's so surreal and very different to be on the receiving end of care instead of providing it."   Objective:  Rebecca Lucas AXE is a 63 y.o. 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, osseous, and epidural involvement (s/p palliative radiotherapy of the lumbar area and was receiving  chemotherapy) when she presented to the [facility] emergency department for evaluation of 3-4 day history of urinary retention.She was hospitalized at [facility] from 8/25-9/25/2020, from 8/25-9/1, she had surgery on 05/09/19 to treat spinal tumor via L2 laminectomy, bilateral pediculotomy (biltaeral transpedicular approach) for decompression of thecal sac and nerve roots, Posterior segmental instrumentation, T12-L4 using alphatec screws, Posterolateral arthrodesis, L1-L3, Use of morcellized bone allograft  She was then admitted to the acute rehabilitation unit on 9/1 and discharged home on 9/25. Comorbidities include: poorly  differentiated lung cancer, with metastasis to adrenal gland, spine, and bone.  She was discharged to home on 06/07/19 with home health services and DME provided by [facility]. On 06/10/19 her husband requested and received an order for Hospice consult.    Assessment:  Terminally ill patient transitioning to [name]. See transition of care flowsheet for assessment details.   Plan:  Provided emotional support to husband and reviewed services provided by [facility]. At Dr. [name] request, emailed this RNCM's contact information to his [facility] and personal e-mail address and encouraged him to contact this RNCM for any questions or concerns.  No immedicate care management needs identified so will close case to [facility] services.   Barrington Ellison RN,CCM,CDE [place] Management Coordinator Office Phone [phone] Office Fax [phone]

## 2019-06-10 NOTE — Telephone Encounter (Signed)
Received call from husband Dr. [name] requesting pt's appt for chemo Thursday 10/1 to be cancelled since pt was not feeling up to it.  Marcello Moores also would really like to talk to Dr. Julien Nordmann to discuss further about pt's situation.   Marcello Moores also would like to inquire about Hospice services for pt. Dr. Lorette Ang     Phone     [phone].

## 2019-06-10 NOTE — Telephone Encounter (Signed)
Discussed the prognosis and referral to hospice with the hospital.  Please see my other documentation note.

## 2019-06-10 NOTE — Progress Notes (Unsigned)
I had a telephone call with Dr. 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  He mentioned that his wife is not doing well and she would not be able to proceed with any further treatment.  He called hospice care of [place] earlier today.  I agreed with his decision and I think hospice is a reasonable option for her at this point.  I will cancel her appointment on Thursday for the follow-up and treatment. I will be happy to be see hospice attending for her needs. We will call the hospice service and inform them with this decision. I wished them the best and advised him to call if we can help him and his wife in any way.

## 2019-06-10 NOTE — Telephone Encounter (Signed)
Spoke with [name] @ [facility], and informed her that Dr. Julien Nordmann will be the attending for hospice.  Asked that hospice providers to assist with symptoms management for pt.  [name] voiced understanding. Schedule message sent for cancelling all appts at the clinic.

## 2019-06-10 NOTE — Telephone Encounter (Signed)
[name] from [facility] ) called stating that pt's husband had called and made referral for hospice.  Dr. [name] would like for Dr. Julien Nordmann to be the attending.  [name] wanted to know if this is ok with Dr. Julien Nordmann. [name]'s   Phone    [phone].

## 2019-06-10 NOTE — Telephone Encounter (Signed)
Received voicemail message from Dr. Waylan Rocher reference his wife. Dr. [name] explained that his wife doesn't care to pursue any further treatment. He request the reconsult and simulation appointments for today be cancelled. Informed Jehnna, RT in sim of this finding. Also, he request a return call from Dr. Tammi Klippel or one of his PAs. Providers informed via email and inbasket messaging.

## 2019-06-11 ENCOUNTER — Encounter: Payer: Self-pay | Admitting: *Deleted

## 2019-06-11 ENCOUNTER — Telehealth: Payer: Self-pay | Admitting: Registered Nurse

## 2019-06-11 DIAGNOSIS — C7949 Secondary malignant neoplasm of other parts of nervous system: Secondary | ICD-10-CM | POA: Diagnosis not present

## 2019-06-11 DIAGNOSIS — M545 Low back pain: Secondary | ICD-10-CM | POA: Diagnosis not present

## 2019-06-11 DIAGNOSIS — K219 Gastro-esophageal reflux disease without esophagitis: Secondary | ICD-10-CM | POA: Diagnosis not present

## 2019-06-11 DIAGNOSIS — G834 Cauda equina syndrome: Secondary | ICD-10-CM | POA: Diagnosis not present

## 2019-06-11 DIAGNOSIS — C3411 Malignant neoplasm of upper lobe, right bronchus or lung: Secondary | ICD-10-CM | POA: Diagnosis not present

## 2019-06-11 DIAGNOSIS — C7951 Secondary malignant neoplasm of bone: Secondary | ICD-10-CM | POA: Diagnosis not present

## 2019-06-11 DIAGNOSIS — C787 Secondary malignant neoplasm of liver and intrahepatic bile duct: Secondary | ICD-10-CM | POA: Diagnosis not present

## 2019-06-11 DIAGNOSIS — C7972 Secondary malignant neoplasm of left adrenal gland: Secondary | ICD-10-CM | POA: Diagnosis not present

## 2019-06-11 DIAGNOSIS — K279 Peptic ulcer, site unspecified, unspecified as acute or chronic, without hemorrhage or perforation: Secondary | ICD-10-CM | POA: Diagnosis not present

## 2019-06-11 NOTE — Telephone Encounter (Signed)
Placed a call to Mr. ( Dr. [name]) husband of Mrs. Hamler, he states Mrs. Foxworthy began to decline 24 hours post discharge. Hospice consult was placed for initial evaluation. At this time [facility] came to their home on Saturday 06/08/2019 for initial evaluation. A Nurse is scheduled to come to their home today.   Mr. Cabeza would like to cancel the appointment with Dr. Dagoberto Ligas. He is very appreciative for all of Dr. Dagoberto Ligas hard work and had nothing but accolades for the entire Inpatient rehabilitation team. He's very appreciative. Emotional support given.

## 2019-06-12 DIAGNOSIS — K219 Gastro-esophageal reflux disease without esophagitis: Secondary | ICD-10-CM | POA: Diagnosis not present

## 2019-06-12 DIAGNOSIS — M545 Low back pain: Secondary | ICD-10-CM | POA: Diagnosis not present

## 2019-06-12 DIAGNOSIS — C787 Secondary malignant neoplasm of liver and intrahepatic bile duct: Secondary | ICD-10-CM | POA: Diagnosis not present

## 2019-06-12 DIAGNOSIS — C7972 Secondary malignant neoplasm of left adrenal gland: Secondary | ICD-10-CM | POA: Diagnosis not present

## 2019-06-12 DIAGNOSIS — K279 Peptic ulcer, site unspecified, unspecified as acute or chronic, without hemorrhage or perforation: Secondary | ICD-10-CM | POA: Diagnosis not present

## 2019-06-12 DIAGNOSIS — C7951 Secondary malignant neoplasm of bone: Secondary | ICD-10-CM | POA: Diagnosis not present

## 2019-06-12 DIAGNOSIS — C3411 Malignant neoplasm of upper lobe, right bronchus or lung: Secondary | ICD-10-CM | POA: Diagnosis not present

## 2019-06-12 DIAGNOSIS — G834 Cauda equina syndrome: Secondary | ICD-10-CM | POA: Diagnosis not present

## 2019-06-12 DIAGNOSIS — C7949 Secondary malignant neoplasm of other parts of nervous system: Secondary | ICD-10-CM | POA: Diagnosis not present

## 2019-06-12 MED FILL — LORazepam 1 MG TABS: 1 | 3 days supply | Qty: 20 | Fill #0

## 2019-06-12 MED FILL — MORPHINE SULF 100 MG/5 ML S: 100 | 6 days supply | Qty: 30 | Fill #0

## 2019-06-12 MED FILL — HALOPERIDOL 5 MG TABS: 5 | 5 days supply | Qty: 30 | Fill #0

## 2019-06-13 ENCOUNTER — Ambulatory Visit: Payer: 59

## 2019-06-13 ENCOUNTER — Ambulatory Visit: Payer: 59 | Admitting: Internal Medicine

## 2019-06-13 ENCOUNTER — Other Ambulatory Visit: Payer: 59

## 2019-06-13 DIAGNOSIS — C7972 Secondary malignant neoplasm of left adrenal gland: Secondary | ICD-10-CM | POA: Diagnosis not present

## 2019-06-13 DIAGNOSIS — C3411 Malignant neoplasm of upper lobe, right bronchus or lung: Secondary | ICD-10-CM | POA: Diagnosis not present

## 2019-06-13 DIAGNOSIS — K219 Gastro-esophageal reflux disease without esophagitis: Secondary | ICD-10-CM | POA: Diagnosis not present

## 2019-06-13 DIAGNOSIS — K279 Peptic ulcer, site unspecified, unspecified as acute or chronic, without hemorrhage or perforation: Secondary | ICD-10-CM | POA: Diagnosis not present

## 2019-06-13 DIAGNOSIS — C7951 Secondary malignant neoplasm of bone: Secondary | ICD-10-CM | POA: Diagnosis not present

## 2019-06-13 DIAGNOSIS — G834 Cauda equina syndrome: Secondary | ICD-10-CM | POA: Diagnosis not present

## 2019-06-13 DIAGNOSIS — C7949 Secondary malignant neoplasm of other parts of nervous system: Secondary | ICD-10-CM | POA: Diagnosis not present

## 2019-06-13 DIAGNOSIS — C787 Secondary malignant neoplasm of liver and intrahepatic bile duct: Secondary | ICD-10-CM | POA: Diagnosis not present

## 2019-06-13 DIAGNOSIS — M545 Low back pain: Secondary | ICD-10-CM | POA: Diagnosis not present

## 2019-06-14 DIAGNOSIS — C787 Secondary malignant neoplasm of liver and intrahepatic bile duct: Secondary | ICD-10-CM | POA: Diagnosis not present

## 2019-06-14 DIAGNOSIS — C7951 Secondary malignant neoplasm of bone: Secondary | ICD-10-CM | POA: Diagnosis not present

## 2019-06-14 DIAGNOSIS — K279 Peptic ulcer, site unspecified, unspecified as acute or chronic, without hemorrhage or perforation: Secondary | ICD-10-CM | POA: Diagnosis not present

## 2019-06-14 DIAGNOSIS — G834 Cauda equina syndrome: Secondary | ICD-10-CM | POA: Diagnosis not present

## 2019-06-14 DIAGNOSIS — C3411 Malignant neoplasm of upper lobe, right bronchus or lung: Secondary | ICD-10-CM | POA: Diagnosis not present

## 2019-06-14 DIAGNOSIS — M545 Low back pain: Secondary | ICD-10-CM | POA: Diagnosis not present

## 2019-06-14 DIAGNOSIS — C7949 Secondary malignant neoplasm of other parts of nervous system: Secondary | ICD-10-CM | POA: Diagnosis not present

## 2019-06-14 DIAGNOSIS — C7972 Secondary malignant neoplasm of left adrenal gland: Secondary | ICD-10-CM | POA: Diagnosis not present

## 2019-06-14 DIAGNOSIS — K219 Gastro-esophageal reflux disease without esophagitis: Secondary | ICD-10-CM | POA: Diagnosis not present

## 2019-06-15 DIAGNOSIS — C3411 Malignant neoplasm of upper lobe, right bronchus or lung: Secondary | ICD-10-CM | POA: Diagnosis not present

## 2019-06-15 DIAGNOSIS — C7972 Secondary malignant neoplasm of left adrenal gland: Secondary | ICD-10-CM | POA: Diagnosis not present

## 2019-06-15 DIAGNOSIS — K279 Peptic ulcer, site unspecified, unspecified as acute or chronic, without hemorrhage or perforation: Secondary | ICD-10-CM | POA: Diagnosis not present

## 2019-06-15 DIAGNOSIS — K219 Gastro-esophageal reflux disease without esophagitis: Secondary | ICD-10-CM | POA: Diagnosis not present

## 2019-06-15 DIAGNOSIS — C787 Secondary malignant neoplasm of liver and intrahepatic bile duct: Secondary | ICD-10-CM | POA: Diagnosis not present

## 2019-06-15 DIAGNOSIS — G834 Cauda equina syndrome: Secondary | ICD-10-CM | POA: Diagnosis not present

## 2019-06-15 DIAGNOSIS — C7949 Secondary malignant neoplasm of other parts of nervous system: Secondary | ICD-10-CM | POA: Diagnosis not present

## 2019-06-15 DIAGNOSIS — M545 Low back pain: Secondary | ICD-10-CM | POA: Diagnosis not present

## 2019-06-15 DIAGNOSIS — C7951 Secondary malignant neoplasm of bone: Secondary | ICD-10-CM | POA: Diagnosis not present

## 2019-06-16 DIAGNOSIS — M545 Low back pain: Secondary | ICD-10-CM | POA: Diagnosis not present

## 2019-06-16 DIAGNOSIS — K279 Peptic ulcer, site unspecified, unspecified as acute or chronic, without hemorrhage or perforation: Secondary | ICD-10-CM | POA: Diagnosis not present

## 2019-06-16 DIAGNOSIS — C3411 Malignant neoplasm of upper lobe, right bronchus or lung: Secondary | ICD-10-CM | POA: Diagnosis not present

## 2019-06-16 DIAGNOSIS — K219 Gastro-esophageal reflux disease without esophagitis: Secondary | ICD-10-CM | POA: Diagnosis not present

## 2019-06-16 DIAGNOSIS — C7951 Secondary malignant neoplasm of bone: Secondary | ICD-10-CM | POA: Diagnosis not present

## 2019-06-16 DIAGNOSIS — C7949 Secondary malignant neoplasm of other parts of nervous system: Secondary | ICD-10-CM | POA: Diagnosis not present

## 2019-06-16 DIAGNOSIS — C7972 Secondary malignant neoplasm of left adrenal gland: Secondary | ICD-10-CM | POA: Diagnosis not present

## 2019-06-16 DIAGNOSIS — C787 Secondary malignant neoplasm of liver and intrahepatic bile duct: Secondary | ICD-10-CM | POA: Diagnosis not present

## 2019-06-16 DIAGNOSIS — G834 Cauda equina syndrome: Secondary | ICD-10-CM | POA: Diagnosis not present

## 2019-06-17 DIAGNOSIS — C7972 Secondary malignant neoplasm of left adrenal gland: Secondary | ICD-10-CM | POA: Diagnosis not present

## 2019-06-17 DIAGNOSIS — C7951 Secondary malignant neoplasm of bone: Secondary | ICD-10-CM | POA: Diagnosis not present

## 2019-06-17 DIAGNOSIS — K279 Peptic ulcer, site unspecified, unspecified as acute or chronic, without hemorrhage or perforation: Secondary | ICD-10-CM | POA: Diagnosis not present

## 2019-06-17 DIAGNOSIS — M545 Low back pain: Secondary | ICD-10-CM | POA: Diagnosis not present

## 2019-06-17 DIAGNOSIS — C787 Secondary malignant neoplasm of liver and intrahepatic bile duct: Secondary | ICD-10-CM | POA: Diagnosis not present

## 2019-06-17 DIAGNOSIS — K219 Gastro-esophageal reflux disease without esophagitis: Secondary | ICD-10-CM | POA: Diagnosis not present

## 2019-06-17 DIAGNOSIS — C7949 Secondary malignant neoplasm of other parts of nervous system: Secondary | ICD-10-CM | POA: Diagnosis not present

## 2019-06-17 DIAGNOSIS — C3411 Malignant neoplasm of upper lobe, right bronchus or lung: Secondary | ICD-10-CM | POA: Diagnosis not present

## 2019-06-17 DIAGNOSIS — G834 Cauda equina syndrome: Secondary | ICD-10-CM | POA: Diagnosis not present

## 2019-06-18 DIAGNOSIS — C787 Secondary malignant neoplasm of liver and intrahepatic bile duct: Secondary | ICD-10-CM | POA: Diagnosis not present

## 2019-06-18 DIAGNOSIS — C3411 Malignant neoplasm of upper lobe, right bronchus or lung: Secondary | ICD-10-CM | POA: Diagnosis not present

## 2019-06-18 DIAGNOSIS — C7972 Secondary malignant neoplasm of left adrenal gland: Secondary | ICD-10-CM | POA: Diagnosis not present

## 2019-06-18 DIAGNOSIS — K219 Gastro-esophageal reflux disease without esophagitis: Secondary | ICD-10-CM | POA: Diagnosis not present

## 2019-06-18 DIAGNOSIS — C7949 Secondary malignant neoplasm of other parts of nervous system: Secondary | ICD-10-CM | POA: Diagnosis not present

## 2019-06-18 DIAGNOSIS — G834 Cauda equina syndrome: Secondary | ICD-10-CM | POA: Diagnosis not present

## 2019-06-18 DIAGNOSIS — C7951 Secondary malignant neoplasm of bone: Secondary | ICD-10-CM | POA: Diagnosis not present

## 2019-06-18 DIAGNOSIS — M545 Low back pain: Secondary | ICD-10-CM | POA: Diagnosis not present

## 2019-06-18 DIAGNOSIS — K279 Peptic ulcer, site unspecified, unspecified as acute or chronic, without hemorrhage or perforation: Secondary | ICD-10-CM | POA: Diagnosis not present

## 2019-06-20 ENCOUNTER — Other Ambulatory Visit: Payer: Self-pay | Admitting: *Deleted

## 2019-06-20 NOTE — Patient Outreach (Signed)
[facility]) Care Management  06/20/2019  KEMI GELL 29-Oct-1955 [id number]  Monthly telephonic UMR case management review with [facility] RNCMs and Dr. Alonza Bogus. Update provided to include: transition of care call completed 06/10/19. Husband who is MD asked for and received Hospice consult.   Barrington Ellison RN,CCM,CDE [place] Management Coordinator Office Phone [phone] Office Fax [phone]

## 2019-06-21 ENCOUNTER — Encounter: Payer: 59 | Admitting: Physical Medicine and Rehabilitation

## 2019-06-24 ENCOUNTER — Telehealth: Payer: Self-pay | Admitting: Medical Oncology

## 2019-06-24 NOTE — Telephone Encounter (Signed)
Pt died 2019/06/29

## 2019-06-28 ENCOUNTER — Encounter: Payer: Self-pay | Admitting: Radiation Therapy

## 2019-07-14 DEATH — deceased

## 2019-07-26 IMAGING — CR DG CERVICAL SPINE COMPLETE 4+V
6 series · 6 of 6 positions shown · non-contrast
Comparison: None.

CLINICAL DATA: New onset neck pain for 5 weeks.

EXAM:
CERVICAL SPINE - COMPLETE 4+ VIEW

[w cervical spine lat]
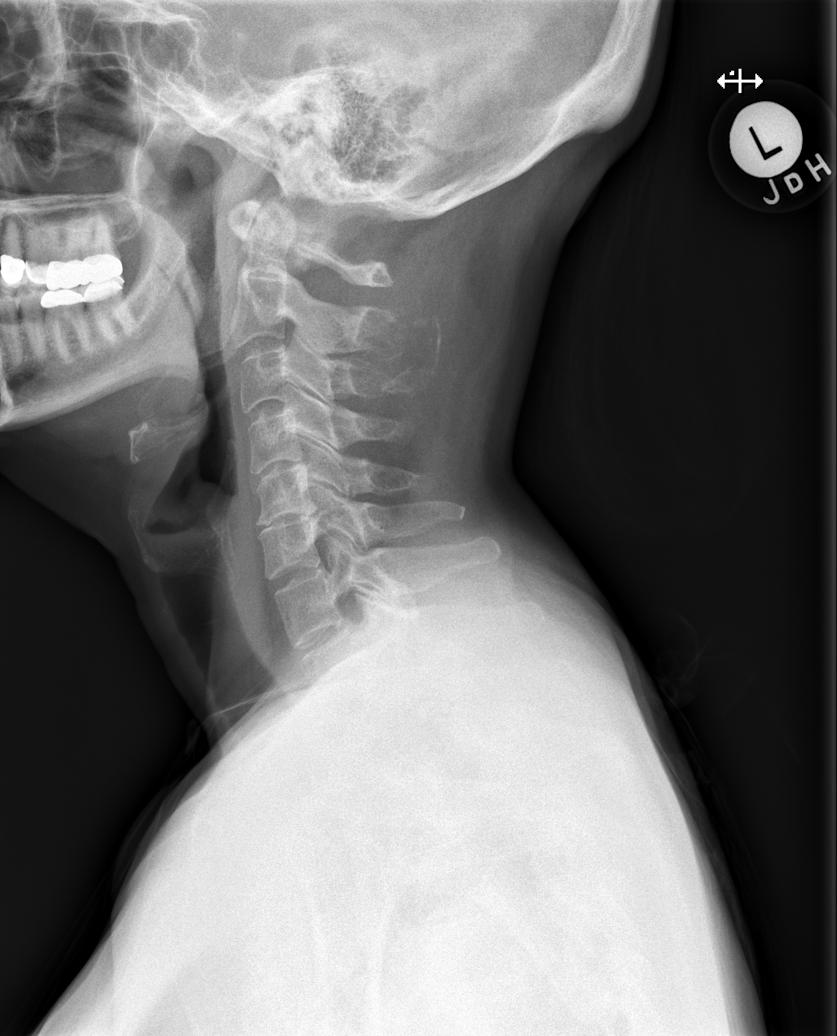

[w cervical spine ap_obl (1 of 2)]
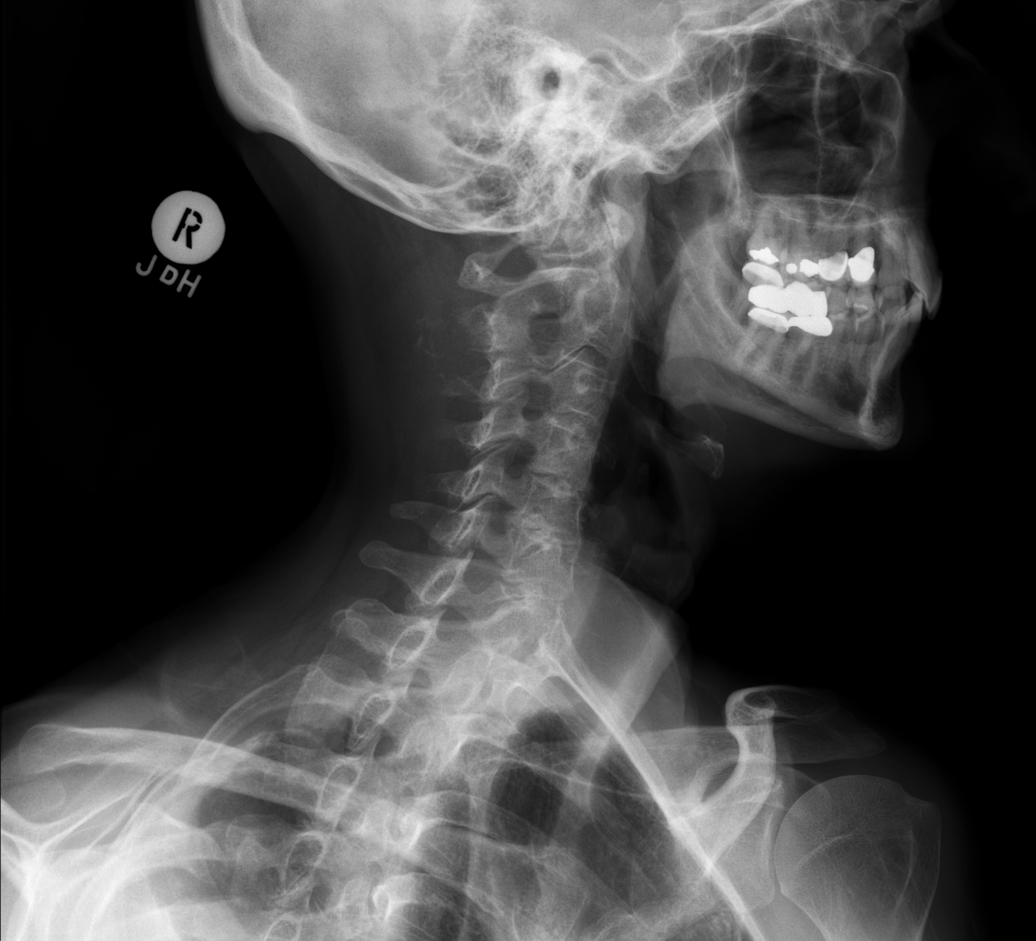

[w cervical spine ap_obl (2 of 2)]
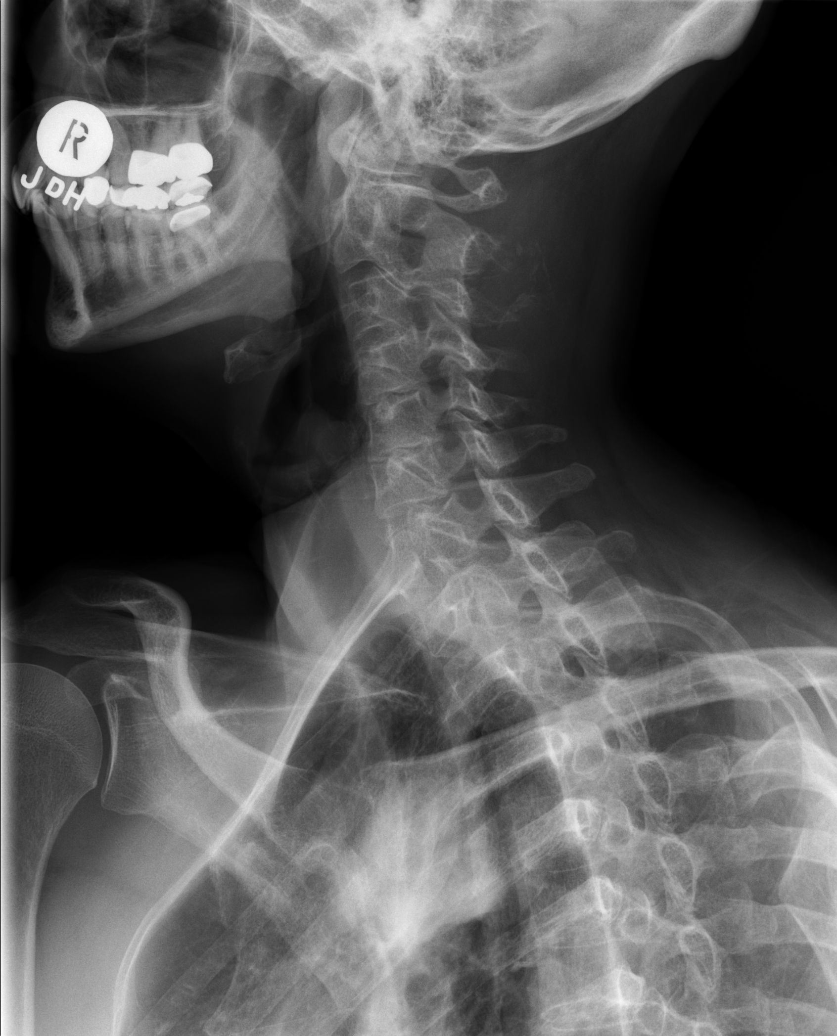

[w cervical spine ap]
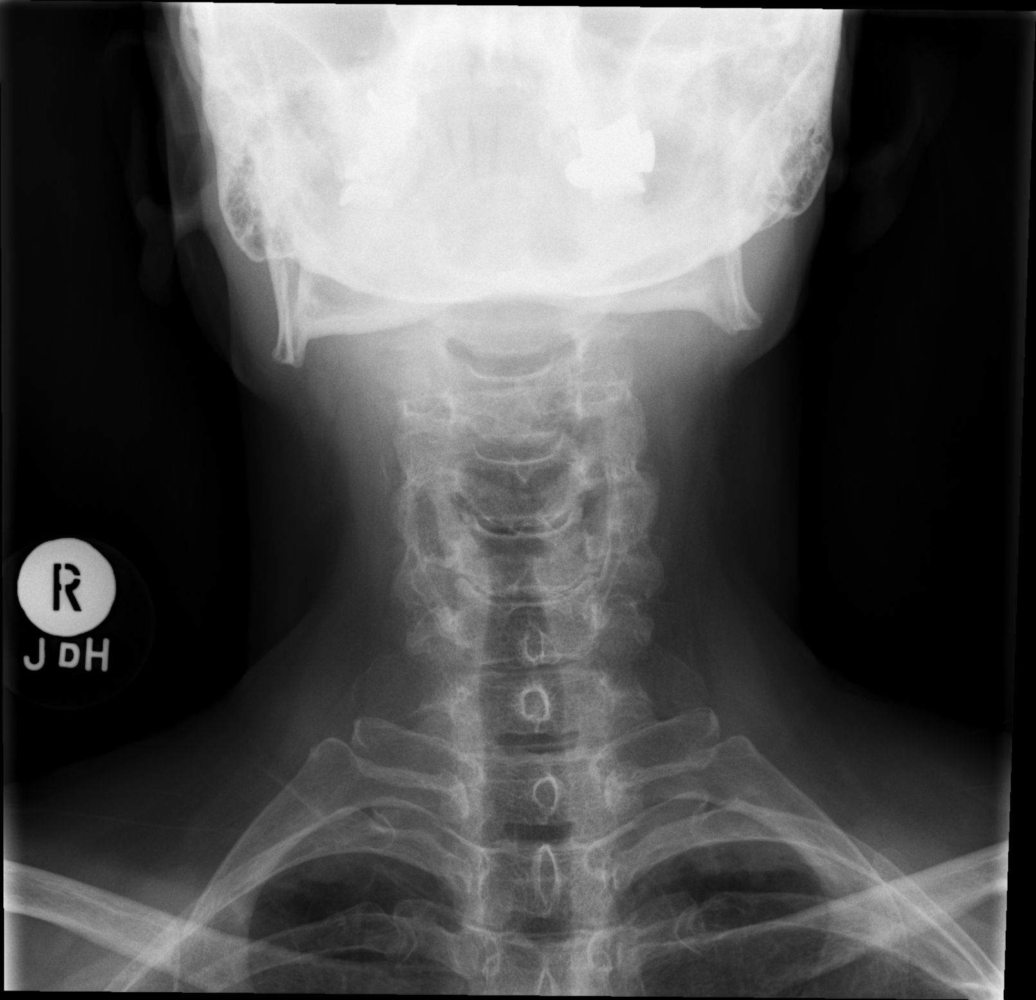

[w cervical spine odontoid (1 of 2)]
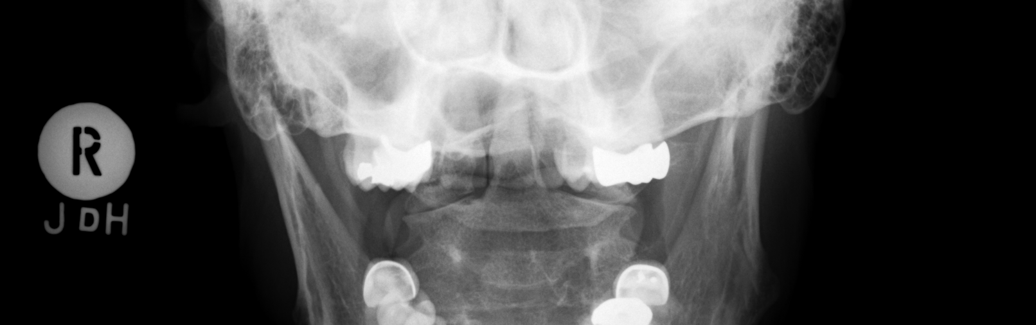

[w cervical spine odontoid (2 of 2)]
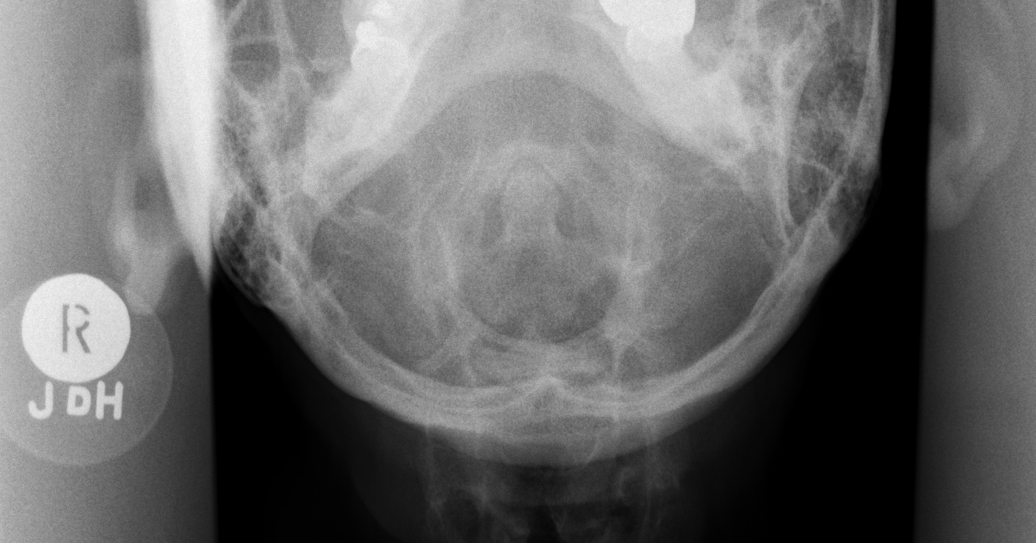

[6 of 6 positions shown; findings below may reference images not displayed]

FINDINGS: There is no evidence of cervical spine fracture or prevertebral soft
tissue swelling. Alignment is normal. Degenerative disc disease with
disc height loss at C5-6.

Expansile ill-defined bone lesion in the posterior aspect of the C2
spinous process with bone destruction which appears also involve C3
spinous process.
IMPRESSION: Expansile ill-defined bone lesion in the posterior aspect of the C2
spinous process with bone destruction which appears also involve C3
spinous process. The appearance is most concerning for malignancy
until proven otherwise. Recommend further evaluation with a CT or
MRI of the cervical spine.

No acute osseous injury of the cervical spine.

These results will be called to the ordering clinician or
representative by the Radiologist Assistant, and communication
documented in the PACS or zVision Dashboard.
